# Patient Record
Sex: Female | Born: 1935 | Race: White | Hispanic: No | Marital: Married | Smoking: Never smoker
Health system: Southern US, Community
[De-identification: ages and names within clinical notes are randomized; demographics above are authoritative.]

## PROBLEM LIST (undated history)

## (undated) DIAGNOSIS — J45909 Unspecified asthma, uncomplicated: Secondary | ICD-10-CM

## (undated) DIAGNOSIS — K219 Gastro-esophageal reflux disease without esophagitis: Secondary | ICD-10-CM

## (undated) DIAGNOSIS — R51 Headache: Secondary | ICD-10-CM

## (undated) DIAGNOSIS — I219 Acute myocardial infarction, unspecified: Secondary | ICD-10-CM

## (undated) DIAGNOSIS — I420 Dilated cardiomyopathy: Secondary | ICD-10-CM

## (undated) DIAGNOSIS — D649 Anemia, unspecified: Secondary | ICD-10-CM

## (undated) DIAGNOSIS — M1990 Unspecified osteoarthritis, unspecified site: Secondary | ICD-10-CM

## (undated) DIAGNOSIS — T827XXA Infection and inflammatory reaction due to other cardiac and vascular devices, implants and grafts, initial encounter: Secondary | ICD-10-CM

## (undated) DIAGNOSIS — R7881 Bacteremia: Secondary | ICD-10-CM

## (undated) DIAGNOSIS — L402 Acrodermatitis continua: Secondary | ICD-10-CM

## (undated) DIAGNOSIS — K922 Gastrointestinal hemorrhage, unspecified: Secondary | ICD-10-CM

## (undated) DIAGNOSIS — R519 Headache, unspecified: Secondary | ICD-10-CM

## (undated) DIAGNOSIS — I428 Other cardiomyopathies: Secondary | ICD-10-CM

## (undated) DIAGNOSIS — I447 Left bundle-branch block, unspecified: Secondary | ICD-10-CM

## (undated) DIAGNOSIS — I509 Heart failure, unspecified: Secondary | ICD-10-CM

## (undated) DIAGNOSIS — K859 Acute pancreatitis without necrosis or infection, unspecified: Secondary | ICD-10-CM

## (undated) DIAGNOSIS — Z9289 Personal history of other medical treatment: Secondary | ICD-10-CM

## (undated) DIAGNOSIS — I5042 Chronic combined systolic (congestive) and diastolic (congestive) heart failure: Secondary | ICD-10-CM

## (undated) DIAGNOSIS — E039 Hypothyroidism, unspecified: Secondary | ICD-10-CM

## (undated) DIAGNOSIS — I4891 Unspecified atrial fibrillation: Secondary | ICD-10-CM

## (undated) DIAGNOSIS — A419 Sepsis, unspecified organism: Secondary | ICD-10-CM

## (undated) DIAGNOSIS — F09 Unspecified mental disorder due to known physiological condition: Secondary | ICD-10-CM

## (undated) DIAGNOSIS — F32A Depression, unspecified: Secondary | ICD-10-CM

## (undated) DIAGNOSIS — B9561 Methicillin susceptible Staphylococcus aureus infection as the cause of diseases classified elsewhere: Secondary | ICD-10-CM

## (undated) DIAGNOSIS — M3500 Sicca syndrome, unspecified: Secondary | ICD-10-CM

## (undated) DIAGNOSIS — K635 Polyp of colon: Secondary | ICD-10-CM

## (undated) DIAGNOSIS — L4050 Arthropathic psoriasis, unspecified: Secondary | ICD-10-CM

## (undated) DIAGNOSIS — G47419 Narcolepsy without cataplexy: Secondary | ICD-10-CM

## (undated) DIAGNOSIS — Z8619 Personal history of other infectious and parasitic diseases: Secondary | ICD-10-CM

## (undated) HISTORY — PX: HX LUMBAR FUSION: SHX111

## (undated) HISTORY — DX: Sjogren syndrome, unspecified: M35.00

## (undated) HISTORY — DX: Unspecified asthma, uncomplicated: J45.909

## (undated) HISTORY — DX: Methicillin susceptible Staphylococcus aureus infection as the cause of diseases classified elsewhere: B95.61

## (undated) HISTORY — DX: Polyp of colon: K63.5

## (undated) HISTORY — DX: Unspecified atrial fibrillation: I48.91

## (undated) HISTORY — PX: HX CATARACT REMOVAL: SHX102

## (undated) HISTORY — PX: HX HYSTERECTOMY: SHX81

## (undated) HISTORY — DX: Acute myocardial infarction, unspecified: I21.9

## (undated) HISTORY — DX: Sepsis, unspecified organism: A41.9

## (undated) HISTORY — DX: Heart failure, unspecified: I50.9

## (undated) HISTORY — DX: Acrodermatitis continua: L40.2

## (undated) HISTORY — DX: Arthropathic psoriasis, unspecified: L40.50

## (undated) HISTORY — DX: Narcolepsy without cataplexy: G47.419

## (undated) HISTORY — DX: Unspecified osteoarthritis, unspecified site: M19.90

## (undated) HISTORY — PX: HX KNEE SURGERY: 2100001320

## (undated) HISTORY — DX: Gastro-esophageal reflux disease without esophagitis: K21.9

## (undated) HISTORY — PX: HX HERNIA REPAIR: SHX51

## (undated) HISTORY — DX: Dilated cardiomyopathy: I42.0

## (undated) HISTORY — DX: Bacteremia: R78.81

## (undated) HISTORY — PX: HX GALL BLADDER SURGERY/CHOLE: SHX55

## (undated) HISTORY — PX: ESOPHAGOGASTRODUODENOSCOPY: SHX1529

## (undated) HISTORY — PX: REPLACEMENT TOTAL KNEE: SUR1224

## (undated) HISTORY — DX: Personal history of other infectious and parasitic diseases: Z86.19

## (undated) HISTORY — DX: Left bundle-branch block, unspecified: I44.7

## (undated) HISTORY — PX: HX APPENDECTOMY: SHX54

## (undated) HISTORY — DX: Depression, unspecified: F32.A

## (undated) HISTORY — DX: Hypothyroidism, unspecified: E03.9

## (undated) HISTORY — PX: COLONOSCOPY: SHX174

## (undated) HISTORY — PX: HX TONSILLECTOMY: SHX27

## (undated) HISTORY — PX: HIP ARTHROPLASTY: SHX981

## (undated) HISTORY — PX: HX PACEMAKER INSERTION: 2100001161

## (undated) HISTORY — DX: Unspecified mental disorder due to known physiological condition: F09

## (undated) HISTORY — PX: ABDOMINAL HYSTERECTOMY: SHX81

## (undated) HISTORY — PX: CARDIAC CATHETERIZATION: SHX172

## (undated) HISTORY — PX: CHOLECYSTECTOMY OPEN: SUR202

## (undated) HISTORY — PX: APPENDECTOMY: SHX54

## (undated) HISTORY — PX: JOINT REPLACEMENT: SHX530

## (undated) HISTORY — DX: Other cardiomyopathies: I42.8

## (undated) HISTORY — DX: Infection and inflammatory reaction due to other cardiac and vascular devices, implants and grafts, initial encounter: T82.7XXA

## (undated) HISTORY — PX: DILATION AND CURETTAGE OF UTERUS: SHX78

## (undated) HISTORY — PX: TONSILLECTOMY: SUR1361

## (undated) HISTORY — DX: Chronic combined systolic (congestive) and diastolic (congestive) heart failure: I50.42

## (undated) HISTORY — PX: CATARACT EXTRACTION W/ INTRAOCULAR LENS  IMPLANT, BILATERAL: SHX1307

## (undated) HISTORY — PX: LOOP RECORDER REMOVAL: EP1215

## (undated) HISTORY — PX: TOTAL HIP ARTHROPLASTY: SHX124

## (undated) HISTORY — PX: UMBILICAL HERNIA REPAIR: SHX196

## (undated) HISTORY — PX: HERNIA REPAIR: SHX51

## (undated) HISTORY — PX: INSERT / REPLACE / REMOVE PACEMAKER: SUR710

## (undated) HISTORY — PX: CARDIAC DEFIBRILLATOR PLACEMENT: SHX171

---

## 1968-01-08 HISTORY — PX: AORTA SURGERY: SHX548

## 1978-01-07 HISTORY — PX: BLADDER SURGERY: SHX569

## 1978-01-07 HISTORY — PX: HX HYSTERECTOMY: SHX81

## 1997-04-27 ENCOUNTER — Ambulatory Visit (HOSPITAL_BASED_OUTPATIENT_CLINIC_OR_DEPARTMENT_OTHER): Admission: RE | Admit: 1997-04-27 | Discharge: 1997-04-27 | Payer: Self-pay | Admitting: *Deleted

## 1997-05-10 ENCOUNTER — Ambulatory Visit (HOSPITAL_COMMUNITY): Admission: RE | Admit: 1997-05-10 | Discharge: 1997-05-10 | Payer: Self-pay | Admitting: *Deleted

## 1998-02-23 ENCOUNTER — Other Ambulatory Visit: Admission: RE | Admit: 1998-02-23 | Discharge: 1998-02-23 | Payer: Self-pay

## 1998-05-15 ENCOUNTER — Ambulatory Visit (HOSPITAL_COMMUNITY): Admission: RE | Admit: 1998-05-15 | Discharge: 1998-05-15 | Payer: Self-pay | Admitting: Gastroenterology

## 1998-10-24 ENCOUNTER — Encounter: Admission: RE | Admit: 1998-10-24 | Discharge: 1998-10-24 | Payer: Self-pay | Admitting: Orthopedic Surgery

## 1998-10-24 ENCOUNTER — Encounter: Payer: Self-pay | Admitting: Orthopedic Surgery

## 1998-10-25 ENCOUNTER — Ambulatory Visit (HOSPITAL_BASED_OUTPATIENT_CLINIC_OR_DEPARTMENT_OTHER): Admission: RE | Admit: 1998-10-25 | Discharge: 1998-10-26 | Payer: Self-pay | Admitting: Orthopedic Surgery

## 1998-10-25 ENCOUNTER — Ambulatory Visit (HOSPITAL_BASED_OUTPATIENT_CLINIC_OR_DEPARTMENT_OTHER): Admission: RE | Admit: 1998-10-25 | Discharge: 1998-10-25 | Payer: Self-pay | Admitting: Orthopedic Surgery

## 2001-07-06 ENCOUNTER — Ambulatory Visit (HOSPITAL_COMMUNITY): Admission: RE | Admit: 2001-07-06 | Discharge: 2001-07-06 | Payer: Self-pay | Admitting: Gastroenterology

## 2002-04-08 ENCOUNTER — Encounter: Payer: Self-pay | Admitting: Orthopedic Surgery

## 2002-04-12 ENCOUNTER — Inpatient Hospital Stay (HOSPITAL_COMMUNITY): Admission: RE | Admit: 2002-04-12 | Discharge: 2002-04-16 | Payer: Self-pay | Admitting: Orthopedic Surgery

## 2004-08-16 ENCOUNTER — Ambulatory Visit (HOSPITAL_COMMUNITY): Admission: RE | Admit: 2004-08-16 | Discharge: 2004-08-16 | Payer: Self-pay | Admitting: Gastroenterology

## 2004-09-12 ENCOUNTER — Inpatient Hospital Stay (HOSPITAL_COMMUNITY): Admission: RE | Admit: 2004-09-12 | Discharge: 2004-09-17 | Payer: Self-pay | Admitting: Orthopedic Surgery

## 2004-10-15 ENCOUNTER — Inpatient Hospital Stay (HOSPITAL_COMMUNITY): Admission: EM | Admit: 2004-10-15 | Discharge: 2004-10-22 | Payer: Self-pay | Admitting: Emergency Medicine

## 2008-01-08 HISTORY — PX: HX PACEMAKER INSERTION: 2100001161

## 2013-04-26 DIAGNOSIS — L402 Acrodermatitis continua: Secondary | ICD-10-CM | POA: Insufficient documentation

## 2013-10-11 ENCOUNTER — Emergency Department (HOSPITAL_COMMUNITY): Payer: Medicare Other

## 2013-10-11 ENCOUNTER — Encounter (HOSPITAL_COMMUNITY): Payer: Self-pay | Admitting: Emergency Medicine

## 2013-10-11 ENCOUNTER — Emergency Department (HOSPITAL_COMMUNITY)
Admission: EM | Admit: 2013-10-11 | Discharge: 2013-10-11 | Disposition: A | Discharge status: Home / Self Care | Payer: Medicare Other | Attending: Emergency Medicine | Admitting: Emergency Medicine

## 2013-10-11 DIAGNOSIS — Z8719 Personal history of other diseases of the digestive system: Secondary | ICD-10-CM | POA: Insufficient documentation

## 2013-10-11 DIAGNOSIS — Z8679 Personal history of other diseases of the circulatory system: Secondary | ICD-10-CM | POA: Diagnosis not present

## 2013-10-11 DIAGNOSIS — Z8739 Personal history of other diseases of the musculoskeletal system and connective tissue: Secondary | ICD-10-CM | POA: Insufficient documentation

## 2013-10-11 DIAGNOSIS — J45909 Unspecified asthma, uncomplicated: Secondary | ICD-10-CM | POA: Insufficient documentation

## 2013-10-11 DIAGNOSIS — R079 Chest pain, unspecified: Secondary | ICD-10-CM | POA: Diagnosis present

## 2013-10-11 DIAGNOSIS — Z8639 Personal history of other endocrine, nutritional and metabolic disease: Secondary | ICD-10-CM | POA: Insufficient documentation

## 2013-10-11 HISTORY — DX: Unspecified asthma, uncomplicated: J45.909

## 2013-10-11 HISTORY — DX: Acute pancreatitis without necrosis or infection, unspecified: K85.90

## 2013-10-11 HISTORY — DX: Left bundle-branch block, unspecified: I44.7

## 2013-10-11 HISTORY — DX: Unspecified osteoarthritis, unspecified site: M19.90

## 2013-10-11 LAB — CBC
HCT: 40.1 % (ref 36.0–46.0)
Hemoglobin: 14.1 g/dL (ref 12.0–15.0)
MCH: 31.9 pg (ref 26.0–34.0)
MCHC: 35.2 g/dL (ref 30.0–36.0)
MCV: 90.7 fL (ref 78.0–100.0)
Platelets: 189 10*3/uL (ref 150–400)
RBC: 4.42 MIL/uL (ref 3.87–5.11)
RDW: 13.4 % (ref 11.5–15.5)
WBC: 6.8 10*3/uL (ref 4.0–10.5)

## 2013-10-11 LAB — BASIC METABOLIC PANEL
Anion gap: 14 (ref 5–15)
BUN: 15 mg/dL (ref 6–23)
CO2: 25 mEq/L (ref 19–32)
Calcium: 9.8 mg/dL (ref 8.4–10.5)
Chloride: 100 mEq/L (ref 96–112)
Creatinine, Ser: 1.14 mg/dL — ABNORMAL HIGH (ref 0.50–1.10)
GFR calc Af Amer: 53 mL/min — ABNORMAL LOW (ref >90)
GFR calc non Af Amer: 46 mL/min — ABNORMAL LOW (ref >90)
Glucose, Bld: 107 mg/dL — ABNORMAL HIGH (ref 70–99)
Potassium: 3.8 mEq/L (ref 3.7–5.3)
Sodium: 139 mEq/L (ref 137–147)

## 2013-10-11 LAB — I-STAT TROPONIN, ED: Troponin i, poc: 0 ng/mL (ref 0.00–0.08)

## 2013-10-11 MED ORDER — ONDANSETRON HCL 4 MG/2ML IJ SOLN
4.0000 mg | Freq: Once | INTRAMUSCULAR | Status: AC
  Administered 2013-10-11: 4 mg via INTRAVENOUS
  Filled 2013-10-11: qty 2

## 2013-10-11 MED ORDER — GI COCKTAIL ~~LOC~~
30.0000 mL | Freq: Once | ORAL | Status: AC
  Administered 2013-10-11: 30 mL via ORAL
  Filled 2013-10-11: qty 30

## 2013-10-11 NOTE — ED Notes (Signed)
Pt reports for the past 2 days she was having abd pain and made appointment with physician, then this morning was awaken by the central sub-sternal chest pressure that radiated into jaw and caused her to feel sob and nauseous. Denies vomiting. Nad, skin warm and dry, resp e/u.

## 2013-10-11 NOTE — ED Notes (Signed)
Per EMS - pt coming from doctors office. Pt woke up around 430 am this morning with substernal cp with sob and diaphoresis. Hx of left BBB, 12 lead showing the same. Pt c/o nausea. Pt given 324 mg of ASA, 4 mg Zofran, 2 Nitro, which decreased pain level to 1/10. HR 86, BP 131/59, 22 G in left AC.

## 2013-10-11 NOTE — Discharge Instructions (Signed)
Prilosec 20 mg once daily.  Follow up with your primary Dr. if not improving in the next several days, and return to the ER if your symptoms substantially worsen or change.   Chest Pain (Nonspecific) It is often hard to give a specific diagnosis for the cause of chest pain. There is always a chance that your pain could be related to something serious, such as a heart attack or a blood clot in the lungs. You need to follow up with your health care provider for further evaluation. CAUSES   Heartburn.  Pneumonia or bronchitis.  Anxiety or stress.  Inflammation around your heart (pericarditis) or lung (pleuritis or pleurisy).  A blood clot in the lung.  A collapsed lung (pneumothorax). It can develop suddenly on its own (spontaneous pneumothorax) or from trauma to the chest.  Shingles infection (herpes zoster virus). The chest wall is composed of bones, muscles, and cartilage. Any of these can be the source of the pain.  The bones can be bruised by injury.  The muscles or cartilage can be strained by coughing or overwork.  The cartilage can be affected by inflammation and become sore (costochondritis). DIAGNOSIS  Lab tests or other studies may be needed to find the cause of your pain. Your health care provider may have you take a test called an ambulatory electrocardiogram (ECG). An ECG records your heartbeat patterns over a 24-hour period. You may also have other tests, such as:  Transthoracic echocardiogram (TTE). During echocardiography, sound waves are used to evaluate how blood flows through your heart.  Transesophageal echocardiogram (TEE).  Cardiac monitoring. This allows your health care provider to monitor your heart rate and rhythm in real time.  Holter monitor. This is a portable device that records your heartbeat and can help diagnose heart arrhythmias. It allows your health care provider to track your heart activity for several days, if needed.  Stress tests by  exercise or by giving medicine that makes the heart beat faster. TREATMENT   Treatment depends on what may be causing your chest pain. Treatment may include:  Acid blockers for heartburn.  Anti-inflammatory medicine.  Pain medicine for inflammatory conditions.  Antibiotics if an infection is present.  You may be advised to change lifestyle habits. This includes stopping smoking and avoiding alcohol, caffeine, and chocolate.  You may be advised to keep your head raised (elevated) when sleeping. This reduces the chance of acid going backward from your stomach into your esophagus. Most of the time, nonspecific chest pain will improve within 2-3 days with rest and mild pain medicine.  HOME CARE INSTRUCTIONS   If antibiotics were prescribed, take them as directed. Finish them even if you start to feel better.  For the next few days, avoid physical activities that bring on chest pain. Continue physical activities as directed.  Do not use any tobacco products, including cigarettes, chewing tobacco, or electronic cigarettes.  Avoid drinking alcohol.  Only take medicine as directed by your health care provider.  Follow your health care provider's suggestions for further testing if your chest pain does not go away.  Keep any follow-up appointments you made. If you do not go to an appointment, you could develop lasting (chronic) problems with pain. If there is any problem keeping an appointment, call to reschedule. SEEK MEDICAL CARE IF:   Your chest pain does not go away, even after treatment.  You have a rash with blisters on your chest.  You have a fever. SEEK IMMEDIATE MEDICAL CARE IF:  You have increased chest pain or pain that spreads to your arm, neck, jaw, back, or abdomen.  You have shortness of breath.  You have an increasing cough, or you cough up blood.  You have severe back or abdominal pain.  You feel nauseous or vomit.  You have severe weakness.  You  faint.  You have chills. This is an emergency. Do not wait to see if the pain will go away. Get medical help at once. Call your local emergency services (911 in [place] Do not drive yourself to the hospital. MAKE SURE YOU:   Understand these instructions.  Will watch your condition.  Will get help right away if you are not doing well or get worse. Document Released: 10/03/2004 Document Revised: 12/29/2012 Document Reviewed: 07/30/2007 [facility] Patient Information 2015 [place], [place]. This information is not intended to replace advice given to you by your health care provider. Make sure you discuss any questions you have with your health care provider.

## 2013-10-11 NOTE — ED Provider Notes (Signed)
CSN: [id number]     Arrival [id number] & time 10/11/13  1337 History   First MD Initiated Contact with Patient 10/11/13 1339     Chief Complaint  Patient presents with  . Chest Pain     (Consider location/radiation/quality/duration/timing/severity/associated sxs/prior Treatment) HPI Comments: Patient is a 78 year old female with history of arthritis and asthma. She presents today for evaluation of chest pain. She states this started at around 4:30 AM and woke her from sleep. She describes her discomfort as a "pressure" in the front of her chest that radiates into her neck. She feels nauseated but denies to me she is having any shortness of breath or diaphoresis. She came by EMS who gave her nitroglycerin and aspirin, and appears to be feeling better.  She had hernia repair performed in may. Prior to this surgery, she was quite undergo a stress test which was [place] by [place]. She was told that this test was unremarkable. She has no prior cardiac history and has no cardiac risk factors.  Patient is a 78 y.o. female presenting with chest pain. The history is provided by the patient.  Chest Pain Pain location:  Substernal area Pain quality: pressure   Pain radiates to:  Does not radiate Pain radiates to the back: no   Pain severity:  Moderate Onset quality:  Sudden Timing:  Constant Progression:  Partially resolved Chronicity:  New Relieved by:  Nothing Worsened by:  Nothing tried Ineffective treatments:  None tried   Past Medical History  Diagnosis Date  . Left bundle branch block   . Asthma   . Thyroid disease   . Arthritis   . Pancreatitis, acute    Past Surgical History  Procedure Laterality Date  . Hernia repair    . Abdominal hysterectomy    . Cholecystectomy     No family history on file. History  Substance Use Topics  . Smoking status: Never Smoker   . Smokeless tobacco: Not on file  . Alcohol Use: Yes     Comment: occassionally    OB History   Grav Para Term Preterm Abortions TAB SAB Ect Mult Living                 Review of Systems  Cardiovascular: Positive for chest pain.  All other systems reviewed and are negative.     Allergies  Sulfa antibiotics  Home Medications   Prior to Admission medications   Not on File   BP 130/67  Temp(Src) 98.2 F (36.8 C) (Oral)  Resp 16  Ht 5' (1.524 m)  Wt 165 lb (74.844 kg)  BMI 32.22 kg/m2  SpO2 98% Physical Exam  Nursing note and vitals reviewed. Constitutional: She is oriented to person, place, and time. She appears well-developed and well-nourished. No distress.  HENT:  Head: Normocephalic and atraumatic.  Neck: Normal range of motion. Neck supple.  Cardiovascular: Normal rate and regular rhythm.  Exam reveals no gallop and no friction rub.   No murmur heard. Pulmonary/Chest: Effort normal and breath sounds normal. No respiratory distress. She has no wheezes.  Abdominal: Soft. Bowel sounds are normal. She exhibits no distension. There is no tenderness.  Musculoskeletal: Normal range of motion. She exhibits no edema.  Neurological: She is alert and oriented to person, place, and time.  Skin: Skin is warm and dry. She is not diaphoretic.    ED Course  Procedures (including critical care time) Labs Review Labs Reviewed  BASIC METABOLIC PANEL  CBC  Rosezena Sensor, ED    Imaging Review No results found.   Date: 10/11/2013  Rate: 86  Rhythm: normal sinus rhythm  QRS Axis: left  Intervals: normal  ST/T Wave abnormalities: nonspecific T wave changes  Conduction Disutrbances:left bundle branch block  Narrative Interpretation:   Old EKG Reviewed: none available    MDM   Final diagnoses:  None    Patient presents with complaints of chest discomfort that has been ongoing since approximately 4:30 this morning. Her EKG reveals a left bundle branch block which according to her chart is old. Her troponin is negative and remainder the workup  is unremarkable. She has had significant relief from a GI cocktail and I suspect her symptoms are GI in nature. I will advise Prilosec and follow up with her primary Dr. She has had a nuclear stress test in the past year which was normal.    Geoffery Lyons, MD 10/11/13 [phone]

## 2013-10-11 NOTE — ED Notes (Signed)
Patient to xray at this time

## 2013-10-11 NOTE — ED Notes (Signed)
Received report from [place], RN at this time.

## 2013-10-29 ENCOUNTER — Encounter: Payer: Self-pay | Admitting: *Deleted

## 2013-10-29 ENCOUNTER — Ambulatory Visit (INDEPENDENT_AMBULATORY_CARE_PROVIDER_SITE_OTHER): Payer: Medicare Other | Admitting: Cardiology

## 2013-10-29 ENCOUNTER — Encounter: Payer: Self-pay | Admitting: Cardiology

## 2013-10-29 ENCOUNTER — Encounter (HOSPITAL_COMMUNITY): Payer: Self-pay | Admitting: Pharmacy Technician

## 2013-10-29 VITALS — BP 128/78 | HR 67 | Ht 60.0 in | Wt 163.0 lb

## 2013-10-29 DIAGNOSIS — I447 Left bundle-branch block, unspecified: Secondary | ICD-10-CM

## 2013-10-29 DIAGNOSIS — Z01812 Encounter for preprocedural laboratory examination: Secondary | ICD-10-CM

## 2013-10-29 DIAGNOSIS — I209 Angina pectoris, unspecified: Secondary | ICD-10-CM

## 2013-10-29 DIAGNOSIS — R072 Precordial pain: Secondary | ICD-10-CM

## 2013-10-29 DIAGNOSIS — Z8249 Family history of ischemic heart disease and other diseases of the circulatory system: Secondary | ICD-10-CM

## 2013-10-29 DIAGNOSIS — I208 Other forms of angina pectoris: Secondary | ICD-10-CM

## 2013-10-29 DIAGNOSIS — R079 Chest pain, unspecified: Secondary | ICD-10-CM

## 2013-10-29 LAB — CBC WITH DIFFERENTIAL/PLATELET
Basophils Absolute: 0 10*3/uL (ref 0.0–0.1)
Basophils Relative: 0.6 % (ref 0.0–3.0)
Eosinophils Absolute: 0.4 10*3/uL (ref 0.0–0.7)
Eosinophils Relative: 6.3 % — ABNORMAL HIGH (ref 0.0–5.0)
HCT: 39.1 % (ref 36.0–46.0)
Hemoglobin: 13.3 g/dL (ref 12.0–15.0)
Lymphocytes Relative: 17.3 % (ref 12.0–46.0)
Lymphs Abs: 1.2 10*3/uL (ref 0.7–4.0)
MCHC: 33.9 g/dL (ref 30.0–36.0)
MCV: 91.3 fl (ref 78.0–100.0)
Monocytes Absolute: 0.4 10*3/uL (ref 0.1–1.0)
Monocytes Relative: 6.2 % (ref 3.0–12.0)
Neutro Abs: 4.8 10*3/uL (ref 1.4–7.7)
Neutrophils Relative %: 69.6 % (ref 43.0–77.0)
Platelets: 207 10*3/uL (ref 150.0–400.0)
RBC: 4.28 Mil/uL (ref 3.87–5.11)
RDW: 14 % (ref 11.5–15.5)
WBC: 6.9 10*3/uL (ref 4.0–10.5)

## 2013-10-29 LAB — COMPREHENSIVE METABOLIC PANEL
ALT: 12 U/L (ref 0–35)
AST: 23 U/L (ref 0–37)
Albumin: 3.7 g/dL (ref 3.5–5.2)
Alkaline Phosphatase: 63 U/L (ref 39–117)
BUN: 20 mg/dL (ref 6–23)
CO2: 26 mEq/L (ref 19–32)
Calcium: 10 mg/dL (ref 8.4–10.5)
Chloride: 106 mEq/L (ref 96–112)
Creatinine, Ser: 0.9 mg/dL (ref 0.4–1.2)
GFR: 62.32 mL/min (ref >60.00)
Glucose, Bld: 94 mg/dL (ref 70–99)
Potassium: 3.7 mEq/L (ref 3.5–5.1)
Sodium: 140 mEq/L (ref 135–145)
Total Bilirubin: 0.7 mg/dL (ref 0.2–1.2)
Total Protein: 7.4 g/dL (ref 6.0–8.3)

## 2013-10-29 LAB — PROTIME-INR
INR: 1 ratio (ref 0.8–1.0)
Prothrombin Time: 10.9 s (ref 9.6–13.1)

## 2013-10-29 MED ORDER — ASPIRIN EC 81 MG PO TBEC: 81.0000 mg | DELAYED_RELEASE_TABLET | Freq: Every day | ORAL | Status: DC

## 2013-10-29 MED ORDER — NITROGLYCERIN 0.4 MG SL SUBL
0.4000 mg | SUBLINGUAL_TABLET | SUBLINGUAL | Status: DC | PRN
Start: 2013-10-29 — End: 2014-07-18

## 2013-10-29 NOTE — Patient Instructions (Signed)
Your physician has recommended you make the following change in your medication:   DR NELSON HAS PRESCRIBED FOR YOU TO TAKE NITROGLYCERIN AS NEEDED FOR CHEST PAIN- PLEASE FOLLOW THE INSTRUCTIONS CAREFULLY  PLEASE START TAKING ASPIRIN 81 MG DAILY   Your physician has requested that you have a cardiac catheterization. Cardiac catheterization is used to diagnose and/or treat various heart conditions. Doctors may recommend this procedure for a number of different reasons. The most common reason is to evaluate chest pain. Chest pain can be a symptom of coronary artery disease (CAD), and cardiac catheterization can show whether plaque is narrowing or blocking your heart's arteries. This procedure is also used to evaluate the valves, as well as measure the blood flow and oxygen levels in different parts of your heart. For further information please visit [email or web address] Please follow instruction sheet, as given.  YOUR CATH IS SCHEDULED FOR NEXT Tuesday 11/02/13 AT 7:30 AM WITH DR Clifton James- YOU MUST BE THERE BY 5:30 AM AT [facility]  Your physician recommends that you return for lab work in: TODAY (CMET, PT/INR, CBC W DIFF)  Your physician recommends that you schedule a follow-up appointment in: 6 WEEKS WITH DR Delton See

## 2013-10-29 NOTE — Progress Notes (Signed)
Patient ID: [name] [name], female   DOB: 1935-07-25, 78 y.o.   MRN: [id number]     [name] Date of Encounter: 10/29/2013  Primary Care Provider:  No PCP Per Patient Primary Cardiologist:  Lars Masson  Problem List   Past Medical History  Diagnosis Date  . Left bundle branch block   . Asthma   . Thyroid disease   . Arthritis   . Pancreatitis, acute    Past Surgical History  Procedure Laterality Date  . Hernia repair    . Abdominal hysterectomy    . Cholecystectomy      Allergies  Allergies  Allergen Reactions  . Sulfa Antibiotics Hives    HPI  Patient is a 78 year old female with history of arthritis and asthma. She presents today for evaluation of chest pain.  She was found to have LBBB a year ago when she was undergoing preop evaluation for hernia surgery. She had a Lexiscan nuclear stress in [place] test that was negative. She has developed progressively worsening chest pain in the last two months. The pain is exertional and resolves at rest. Light physical activity can bring on the pain. There is radiation to the left arm.  On 10/11/2013 she went to the ER for chest pain that woke her from sleep. She described her discomfort as a "pressure" in the front of her chest that radiates into her neck. She felt nauseated but denied to me she is having any shortness of breath or diaphoresis. She came by EMS who gave her nitroglycerin and aspirin that helped her pain. ACS was ruled out and she was discharged home.   Her sister had myocardial infarction at age 34, father had CABG at 64, her daughter had myocardial infarction at age 45, per patient secondary to Prinzmetal angina.    Home Medications  Prior to Admission medications   Medication Sig Start Date End Date Taking? Authorizing Provider  amoxicillin (AMOXIL) 500 MG capsule Take 2,000 mg by mouth once. Before dental appointments 09/29/13  Yes Historical Provider, MD    aspirin-acetaminophen-caffeine (EXCEDRIN MIGRAINE) [phone] MG per tablet Take 1 tablet by mouth every 6 (six) hours as needed for headache or migraine.   Yes Historical Provider, MD  beta carotene w/minerals (OCUVITE) tablet Take 1 tablet by mouth daily.   Yes Historical Provider, MD  calcium carbonate (TUMS - DOSED IN MG ELEMENTAL CALCIUM) 500 MG chewable tablet Chew 1-4 tablets by mouth 3 (three) times daily as needed for indigestion or heartburn.   Yes Historical Provider, MD  Cholecalciferol (VITAMIN D PO) Take 1 tablet by mouth daily.   Yes Historical Provider, MD  naproxen (NAPROSYN) 500 MG tablet Take 500 mg by mouth 2 (two) times daily. 09/08/13  Yes Historical Provider, MD  ondansetron (ZOFRAN) 4 MG tablet Take 4 mg by mouth every 8 (eight) hours as needed for nausea or vomiting.   Yes Historical Provider, MD  PROAIR HFA 108 (90 BASE) MCG/ACT inhaler Inhale 1 puff into the lungs every 4 (four) hours as needed for wheezing or shortness of breath.  09/08/13  Yes Historical Provider, MD  SYNTHROID 100 MCG tablet Take 100 mcg by mouth daily. 08/27/13  Yes Historical Provider, MD    Family History  No family history on file.  Social History  History   Social History  . Marital Status: Married    Spouse Name: N/A    Number of Children: N/A  . Years of Education: N/A   Occupational  History  . Not on file.   Social History Main Topics  . Smoking status: Never Smoker   . Smokeless tobacco: Not on file  . Alcohol Use: Yes     Comment: occassionally  . Drug Use: No  . Sexual Activity: Not on file   Other Topics Concern  . Not on file   Social History Narrative  . No narrative on file     Review of Systems, as per HPI, otherwise negative General:  No chills, fever, night sweats or weight changes.  Cardiovascular:  No chest pain, dyspnea on exertion, edema, orthopnea, palpitations, paroxysmal nocturnal dyspnea. Dermatological: No rash, lesions/masses Respiratory: No cough,  dyspnea Urologic: No hematuria, dysuria Abdominal:   No nausea, vomiting, diarrhea, bright red blood per rectum, melena, or hematemesis Neurologic:  No visual changes, wkns, changes in mental status. All other systems reviewed and are otherwise negative except as noted above.  Physical Exam  Blood pressure 128/78, pulse 67, height 5' (1.524 m), weight 163 lb (73.936 kg).  General: Pleasant, NAD Psych: Normal affect. Neuro: Alert and oriented X 3. Moves all extremities spontaneously. HEENT: Normal  Neck: Supple without bruits or JVD. Lungs:  Resp regular and unlabored, CTA. Heart: RRR no s3, s4, or murmurs. Abdomen: Soft, non-tender, non-distended, BS + x 4.  Extremities: No clubbing, cyanosis or edema. DP/PT/Radials 2+ and equal bilaterally.  Labs:  No results found for this basename: CKTOTAL, CKMB, TROPONINI,  in the last 72 hours Lab Results  Component Value Date   WBC 6.8 10/11/2013   HGB 14.1 10/11/2013   HCT 40.1 10/11/2013   MCV 90.7 10/11/2013   PLT 189 10/11/2013    No results found for this basename: DDIMER   No components found with this basename: POCBNP,     Component Value Date/Time   NA 139 10/11/2013 1358   K 3.8 10/11/2013 1358   CL 100 10/11/2013 1358   CO2 25 10/11/2013 1358   GLUCOSE 107* 10/11/2013 1358   BUN 15 10/11/2013 1358   CREATININE 1.14* 10/11/2013 1358   CALCIUM 9.8 10/11/2013 1358   GFRNONAA 46* 10/11/2013 1358   GFRAA 53* 10/11/2013 1358   No results found for this basename: CHOL, HDL, LDLCALC, TRIG    Accessory Clinical Findings  Echocardiogram - none  ECG - SR, LBBB    Assessment & Plan  A very pleasant 78 year old female  1. Typical chest pain - risk factors include significant FH of premature CAD. We will schedule a cardiac cath for the next Tuesday 11/02/2013. We will start aspirin 81 mg po daily and sl NTG PRN.  2. BP - controlled  3. Lipids - unknown - we will check  Check pre-cath labs today including CBC, CMP, INR.   Follow  up after the cath.  Lars MassonNELSON, Merrin Mcvicker H, MD, [facility] 10/29/2013, 9:08 AM

## 2013-10-30 DIAGNOSIS — Z8249 Family history of ischemic heart disease and other diseases of the circulatory system: Secondary | ICD-10-CM | POA: Insufficient documentation

## 2013-10-30 DIAGNOSIS — I447 Left bundle-branch block, unspecified: Secondary | ICD-10-CM | POA: Insufficient documentation

## 2013-11-02 ENCOUNTER — Encounter (HOSPITAL_COMMUNITY): Payer: Self-pay | Admitting: Interventional Cardiology

## 2013-11-02 ENCOUNTER — Ambulatory Visit (HOSPITAL_COMMUNITY)
Admission: RE | Admit: 2013-11-02 | Discharge: 2013-11-02 | Disposition: A | Discharge status: Home / Self Care | Payer: Medicare Other | Source: Ambulatory Visit | Attending: Cardiovascular Disease | Admitting: Cardiovascular Disease

## 2013-11-02 ENCOUNTER — Encounter (HOSPITAL_COMMUNITY)
Admission: RE | Disposition: A | Discharge status: Home / Self Care | Payer: Self-pay | Source: Ambulatory Visit | Attending: Cardiovascular Disease

## 2013-11-02 DIAGNOSIS — R079 Chest pain, unspecified: Secondary | ICD-10-CM | POA: Diagnosis not present

## 2013-11-02 DIAGNOSIS — I447 Left bundle-branch block, unspecified: Secondary | ICD-10-CM | POA: Diagnosis not present

## 2013-11-02 DIAGNOSIS — I209 Angina pectoris, unspecified: Secondary | ICD-10-CM | POA: Diagnosis present

## 2013-11-02 DIAGNOSIS — I771 Stricture of artery: Secondary | ICD-10-CM | POA: Insufficient documentation

## 2013-11-02 DIAGNOSIS — Z79899 Other long term (current) drug therapy: Secondary | ICD-10-CM | POA: Diagnosis not present

## 2013-11-02 DIAGNOSIS — I208 Other forms of angina pectoris: Secondary | ICD-10-CM

## 2013-11-02 DIAGNOSIS — Z792 Long term (current) use of antibiotics: Secondary | ICD-10-CM | POA: Insufficient documentation

## 2013-11-02 DIAGNOSIS — Z8249 Family history of ischemic heart disease and other diseases of the circulatory system: Secondary | ICD-10-CM | POA: Diagnosis not present

## 2013-11-02 HISTORY — PX: LEFT HEART CATHETERIZATION WITH CORONARY ANGIOGRAM: SHX5451

## 2013-11-02 SURGERY — LEFT HEART CATHETERIZATION WITH CORONARY ANGIOGRAM
Anesthesia: LOCAL

## 2013-11-02 MED ORDER — SODIUM CHLORIDE 0.9 % IV SOLN: 250.0000 mL | INTRAVENOUS | Status: DC | PRN

## 2013-11-02 MED ORDER — SODIUM CHLORIDE 0.9 % IJ SOLN: 3.0000 mL | Freq: Two times a day (BID) | INTRAMUSCULAR | Status: DC

## 2013-11-02 MED ORDER — MIDAZOLAM HCL 2 MG/2ML IJ SOLN
INTRAMUSCULAR | Status: AC
  Filled 2013-11-02: qty 2

## 2013-11-02 MED ORDER — LIDOCAINE HCL (PF) 1 % IJ SOLN
INTRAMUSCULAR | Status: AC
  Filled 2013-11-02: qty 30

## 2013-11-02 MED ORDER — HEPARIN SODIUM (PORCINE) 1000 UNIT/ML IJ SOLN
INTRAMUSCULAR | Status: AC
  Filled 2013-11-02: qty 1

## 2013-11-02 MED ORDER — HEPARIN (PORCINE) IN NACL 2-0.9 UNIT/ML-% IJ SOLN
INTRAMUSCULAR | Status: AC
  Filled 2013-11-02: qty 1500

## 2013-11-02 MED ORDER — FENTANYL CITRATE 0.05 MG/ML IJ SOLN
INTRAMUSCULAR | Status: AC
  Filled 2013-11-02: qty 2

## 2013-11-02 MED ORDER — NITROGLYCERIN 1 MG/10 ML FOR IR/CATH LAB
INTRA_ARTERIAL | Status: AC
  Filled 2013-11-02: qty 10

## 2013-11-02 MED ORDER — SODIUM CHLORIDE 0.9 % IV SOLN: 1.0000 mL/kg/h | INTRAVENOUS | Status: DC

## 2013-11-02 MED ORDER — VERAPAMIL HCL 2.5 MG/ML IV SOLN
INTRAVENOUS | Status: AC
  Filled 2013-11-02: qty 2

## 2013-11-02 MED ORDER — ASPIRIN 81 MG PO CHEW
CHEWABLE_TABLET | ORAL | Status: AC
  Filled 2013-11-02: qty 1

## 2013-11-02 MED ORDER — SODIUM CHLORIDE 0.9 % IV SOLN
INTRAVENOUS | Status: DC
  Administered 2013-11-02: 06:00:00 via INTRAVENOUS

## 2013-11-02 MED ORDER — SODIUM CHLORIDE 0.9 % IJ SOLN: 3.0000 mL | INTRAMUSCULAR | Status: DC | PRN

## 2013-11-02 MED ORDER — ASPIRIN 81 MG PO CHEW
81.0000 mg | CHEWABLE_TABLET | ORAL | Status: AC
  Administered 2013-11-02: 81 mg via ORAL

## 2013-11-02 NOTE — Interval H&P Note (Signed)
Cath Lab Visit (complete for each Cath Lab visit)  Clinical Evaluation Leading to the Procedure:   ACS: No.  Non-ACS:    Anginal Classification: CCS III  Anti-ischemic medical therapy: No Therapy  Non-Invasive Test Results: No non-invasive testing performed  Prior CABG: No previous CABG      History and Physical Interval Note:  11/02/2013 10:23 AM  [name] [name]  has presented today for surgery, with the diagnosis of left bundle branch block, angina  The various methods of treatment have been discussed with the patient and family. After consideration of risks, benefits and other options for treatment, the patient has consented to  Procedure(s): LEFT HEART CATHETERIZATION WITH CORONARY ANGIOGRAM (N/A) as a surgical intervention .  The patient's history has been reviewed, patient examined, no change in status, stable for surgery.  I have reviewed the patient's chart and labs.  Questions were answered to the patient's satisfaction.     VARANASI,JAYADEEP S.

## 2013-11-02 NOTE — Discharge Instructions (Signed)
Radial Site Care °Refer to this sheet in the next few weeks. These instructions provide you with information on caring for yourself after your procedure. Your caregiver may also give you more specific instructions. Your treatment has been planned according to current medical practices, but problems sometimes occur. Call your caregiver if you have any problems or questions after your procedure. °HOME CARE INSTRUCTIONS °· You may shower the day after the procedure. Remove the bandage (dressing) and gently wash the site with plain soap and water. Gently pat the site dry. °· Do not apply powder or lotion to the site. °· Do not submerge the affected site in water for 3 to 5 days. °· Inspect the site at least twice daily. °· Do not flex or bend the affected arm for 24 hours. °· No lifting over 5 pounds (2.3 kg) for 5 days after your procedure. °· Do not drive home if you are discharged the same day of the procedure. Have someone else drive you. °· You may drive 24 hours after the procedure unless otherwise instructed by your caregiver. °· Do not operate machinery or power tools for 24 hours. °· A responsible adult should be with you for the first 24 hours after you arrive home. °What to expect: °· Any bruising will usually fade within 1 to 2 weeks. °· Blood that collects in the tissue (hematoma) may be painful to the touch. It should usually decrease in size and tenderness within 1 to 2 weeks. °SEEK IMMEDIATE MEDICAL CARE IF: °· You have unusual pain at the radial site. °· You have redness, warmth, swelling, or pain at the radial site. °· You have drainage (other than a small amount of blood on the dressing). °· You have chills. °· You have a fever or persistent symptoms for more than 72 hours. °· You have a fever and your symptoms suddenly get worse. °· Your arm becomes pale, cool, tingly, or numb. °· You have heavy bleeding from the site. Hold pressure on the site. °Document Released: 01/26/2010 Document Revised:  03/18/2011 Document Reviewed: 01/26/2010 °Exit[employer]® Patient Information ©2015 [employer]. This information is not intended to replace advice given to you by your health care provider. Make sure you discuss any questions you have with your health care provider. ° °

## 2013-11-02 NOTE — CV Procedure (Signed)
       PROCEDURE:  Left heart catheterization with selective coronary angiography, left heart cath.  INDICATIONS:  Angina  The risks, benefits, and details of the procedure were explained to the patient.  The patient verbalized understanding and wanted to proceed.  Informed written consent was obtained.  PROCEDURE TECHNIQUE:  After Xylocaine anesthesia a 65F slender sheath was placed in the right radial artery with a single anterior needle wall stick.   IV heparin was given. Right coronary angiography was done using a Judkins R4 guide catheter.  Left coronary angiography was attempted using a Judkins L3.5 guide catheter, but this catheter would not navigate the tortuosity in the right shoulder. An EBU 3.0, 5 [place] guide catheter was used to engage the left main.  Left ventriculography was attempted using a pigtail catheter, but the pigtail catheter would not cross the tortuosity in the right shoulder. Left heart catheterization was done with a multipurpose catheter.  A TR band was used for hemostasis.   CONTRAST:  Total of 40 cc.  COMPLICATIONS:  None.    HEMODYNAMICS:  Aortic pressure was 120/55; LV pressure was 124/7; LVEDP 14.  There was no gradient between the left ventricle and aorta.    ANGIOGRAPHIC DATA:   The left main coronary artery is widely patent.  The left anterior descending artery is a large vessel which wraps around the apex. There is mild atherosclerosis in the mid vessel. There is a large diagonal vessel which is widely patent.  The left circumflex artery is a large vessel. There are 2 small marginal vessels which are patent. The third obtuse marginal is large and branches across the lateral wall. There is minimal luminal irregularity in the circumflex system.  The right coronary artery is a large, dominant vessel. There are minimal, luminal irregularities.  The posterior descending artery is medium size but patent. The posterior lateral artery is small but patent.  LEFT  VENTRICULOGRAM:  Left ventricular angiogram was not done because the pigtail catheter would not navigate the tortuosity in the right shoulder. Left heart catheterization was done with a multipurpose catheter.  LVEDP was 14 mmHg.  IMPRESSIONS:  1. Widely patent left main coronary artery. 2. Widely patent left anterior descending artery and its branches. 3. Widely patent left circumflex artery and its branches. 4. Widely patent right coronary artery. 5. Left ventricular systolic function not assessed.  LVEDP 14 mmHg.    RECOMMENDATION:  Continue medical therapy.  Significant tortuosity in the right shoulder makes catheter manipulation difficult from the right radial.  If emergent cath was needed, consider alternate access site.  Cardiology follow-up with Dr. Delton See.

## 2013-11-02 NOTE — H&P (View-Only) (Signed)
Patient ID: [name] [name], female   DOB: 1935-07-25, 78 y.o.   MRN: [id number]     [name] Date of Encounter: 10/29/2013  Primary Care Provider:  No PCP Per Patient Primary Cardiologist:  Lars Masson  Problem List   Past Medical History  Diagnosis Date  . Left bundle branch block   . Asthma   . Thyroid disease   . Arthritis   . Pancreatitis, acute    Past Surgical History  Procedure Laterality Date  . Hernia repair    . Abdominal hysterectomy    . Cholecystectomy      Allergies  Allergies  Allergen Reactions  . Sulfa Antibiotics Hives    HPI  Patient is a 78 year old female with history of arthritis and asthma. She presents today for evaluation of chest pain.  She was found to have LBBB a year ago when she was undergoing preop evaluation for hernia surgery. She had a Lexiscan nuclear stress in [place] test that was negative. She has developed progressively worsening chest pain in the last two months. The pain is exertional and resolves at rest. Light physical activity can bring on the pain. There is radiation to the left arm.  On 10/11/2013 she went to the ER for chest pain that woke her from sleep. She described her discomfort as a "pressure" in the front of her chest that radiates into her neck. She felt nauseated but denied to me she is having any shortness of breath or diaphoresis. She came by EMS who gave her nitroglycerin and aspirin that helped her pain. ACS was ruled out and she was discharged home.   Her sister had myocardial infarction at age 34, father had CABG at 64, her daughter had myocardial infarction at age 45, per patient secondary to Prinzmetal angina.    Home Medications  Prior to Admission medications   Medication Sig Start Date End Date Taking? Authorizing Provider  amoxicillin (AMOXIL) 500 MG capsule Take 2,000 mg by mouth once. Before dental appointments 09/29/13  Yes Historical Provider, MD    aspirin-acetaminophen-caffeine (EXCEDRIN MIGRAINE) [phone] MG per tablet Take 1 tablet by mouth every 6 (six) hours as needed for headache or migraine.   Yes Historical Provider, MD  beta carotene w/minerals (OCUVITE) tablet Take 1 tablet by mouth daily.   Yes Historical Provider, MD  calcium carbonate (TUMS - DOSED IN MG ELEMENTAL CALCIUM) 500 MG chewable tablet Chew 1-4 tablets by mouth 3 (three) times daily as needed for indigestion or heartburn.   Yes Historical Provider, MD  Cholecalciferol (VITAMIN D PO) Take 1 tablet by mouth daily.   Yes Historical Provider, MD  naproxen (NAPROSYN) 500 MG tablet Take 500 mg by mouth 2 (two) times daily. 09/08/13  Yes Historical Provider, MD  ondansetron (ZOFRAN) 4 MG tablet Take 4 mg by mouth every 8 (eight) hours as needed for nausea or vomiting.   Yes Historical Provider, MD  PROAIR HFA 108 (90 BASE) MCG/ACT inhaler Inhale 1 puff into the lungs every 4 (four) hours as needed for wheezing or shortness of breath.  09/08/13  Yes Historical Provider, MD  SYNTHROID 100 MCG tablet Take 100 mcg by mouth daily. 08/27/13  Yes Historical Provider, MD    Family History  No family history on file.  Social History  History   Social History  . Marital Status: Married    Spouse Name: N/A    Number of Children: N/A  . Years of Education: N/A   Occupational  History  . Not on file.   Social History Main Topics  . Smoking status: Never Smoker   . Smokeless tobacco: Not on file  . Alcohol Use: Yes     Comment: occassionally  . Drug Use: No  . Sexual Activity: Not on file   Other Topics Concern  . Not on file   Social History Narrative  . No narrative on file     Review of Systems, as per HPI, otherwise negative General:  No chills, fever, night sweats or weight changes.  Cardiovascular:  No chest pain, dyspnea on exertion, edema, orthopnea, palpitations, paroxysmal nocturnal dyspnea. Dermatological: No rash, lesions/masses Respiratory: No cough,  dyspnea Urologic: No hematuria, dysuria Abdominal:   No nausea, vomiting, diarrhea, bright red blood per rectum, melena, or hematemesis Neurologic:  No visual changes, wkns, changes in mental status. All other systems reviewed and are otherwise negative except as noted above.  Physical Exam  Blood pressure 128/78, pulse 67, height 5' (1.524 m), weight 163 lb (73.936 kg).  General: Pleasant, NAD Psych: Normal affect. Neuro: Alert and oriented X 3. Moves all extremities spontaneously. HEENT: Normal  Neck: Supple without bruits or JVD. Lungs:  Resp regular and unlabored, CTA. Heart: RRR no s3, s4, or murmurs. Abdomen: Soft, non-tender, non-distended, BS + x 4.  Extremities: No clubbing, cyanosis or edema. DP/PT/Radials 2+ and equal bilaterally.  Labs:  No results found for this basename: CKTOTAL, CKMB, TROPONINI,  in the last 72 hours Lab Results  Component Value Date   WBC 6.8 10/11/2013   HGB 14.1 10/11/2013   HCT 40.1 10/11/2013   MCV 90.7 10/11/2013   PLT 189 10/11/2013    No results found for this basename: DDIMER   No components found with this basename: POCBNP,     Component Value Date/Time   NA 139 10/11/2013 1358   K 3.8 10/11/2013 1358   CL 100 10/11/2013 1358   CO2 25 10/11/2013 1358   GLUCOSE 107* 10/11/2013 1358   BUN 15 10/11/2013 1358   CREATININE 1.14* 10/11/2013 1358   CALCIUM 9.8 10/11/2013 1358   GFRNONAA 46* 10/11/2013 1358   GFRAA 53* 10/11/2013 1358   No results found for this basename: CHOL, HDL, LDLCALC, TRIG    Accessory Clinical Findings  Echocardiogram - none  ECG - SR, LBBB    Assessment & Plan  A very pleasant 77 year old female  1. Typical chest pain - risk factors include significant FH of premature CAD. We will schedule a cardiac cath for the next Tuesday 11/02/2013. We will start aspirin 81 mg po daily and sl NTG PRN.  2. BP - controlled  3. Lipids - unknown - we will check  Check pre-cath labs today including CBC, CMP, INR.   Follow  up after the cath.  Chanique Duca H, MD, FACC 10/29/2013, 9:08 AM     

## 2013-11-05 ENCOUNTER — Telehealth: Payer: Self-pay | Admitting: Cardiology

## 2013-11-05 DIAGNOSIS — I447 Left bundle-branch block, unspecified: Secondary | ICD-10-CM

## 2013-11-05 NOTE — Telephone Encounter (Signed)
New message     Talk to a nurse regarding cath follow appt and ultrasound

## 2013-11-05 NOTE — Telephone Encounter (Signed)
Cardiac cath done 10/27 by Dr. Eldridge Dace. He told both patient and husband that there were no blockages, but that they might consider having an echo done. She has followup with you in December, but wonders if this should be done prior to that visit.

## 2013-11-08 NOTE — Telephone Encounter (Signed)
Notified pt that Dr. Delton See wanted to do an Echo.  Will order and schedulers will call her with an appointment.

## 2013-11-08 NOTE — Telephone Encounter (Signed)
Could you schedule an echocardiogram ( Dg new LBBB) sometimes thi smonth? She has an appt with me on 12/4. Thank you, Aris Lot

## 2013-11-15 ENCOUNTER — Ambulatory Visit (HOSPITAL_COMMUNITY): Payer: Medicare Other | Attending: Internal Medicine | Admitting: Radiology

## 2013-11-15 ENCOUNTER — Telehealth: Payer: Self-pay | Admitting: *Deleted

## 2013-11-15 DIAGNOSIS — Z8249 Family history of ischemic heart disease and other diseases of the circulatory system: Secondary | ICD-10-CM | POA: Diagnosis not present

## 2013-11-15 DIAGNOSIS — I447 Left bundle-branch block, unspecified: Secondary | ICD-10-CM | POA: Diagnosis not present

## 2013-11-15 MED ORDER — LISINOPRIL 5 MG PO TABS: 5.0000 mg | ORAL_TABLET | Freq: Every day | ORAL | Status: DC

## 2013-11-15 NOTE — Progress Notes (Signed)
Echocardiogram performed.  

## 2013-11-15 NOTE — Telephone Encounter (Signed)
Informed the pt of her echo results showing decreased left ventricular function, and per Dr Delton See she needs to start on a med called Lisinopril 5 mg po daily, to help improve this.  Confirmed the pharmacy of choice with the pt.  Pt verbalized understanding and agrees with this plan.

## 2013-11-15 NOTE — Telephone Encounter (Signed)
-----   Message from Lars Masson, MD sent at 11/15/2013  4:23 PM EST ----- Her echocardiogram showed decreased left ventricular function and she needs to be started on medication that can help to recover it. We will start her on lisinopril 5 mg daily.

## 2013-11-16 ENCOUNTER — Telehealth: Payer: Self-pay | Admitting: Cardiology

## 2013-11-16 NOTE — Telephone Encounter (Signed)
Pt calling to ask what her EF was on her recent echo results, so she could tell her daughter in [place], who is also a Transport planner.  Informed the pt that per Dr Delton See her LVEF on her echo showed 30-35%.  Informed the pt that per Dr Delton See this is called decreased left ventricular function, which is why she recommended the pt start taking Lisinopril 5 mg po daily, which will help to recover it. Pt verbalized understanding and very gracious for all the assistance provided.

## 2013-11-16 NOTE — Telephone Encounter (Signed)
New problem   Pt need to speak to nurse concerning her test results. Please call pt.

## 2013-12-10 ENCOUNTER — Ambulatory Visit (INDEPENDENT_AMBULATORY_CARE_PROVIDER_SITE_OTHER): Payer: Medicare Other | Admitting: Cardiology

## 2013-12-10 ENCOUNTER — Encounter: Payer: Self-pay | Admitting: Cardiology

## 2013-12-10 VITALS — BP 110/56 | HR 83 | Ht 61.0 in | Wt 172.0 lb

## 2013-12-10 DIAGNOSIS — I429 Cardiomyopathy, unspecified: Secondary | ICD-10-CM

## 2013-12-10 DIAGNOSIS — I201 Angina pectoris with documented spasm: Secondary | ICD-10-CM

## 2013-12-10 DIAGNOSIS — I428 Other cardiomyopathies: Secondary | ICD-10-CM

## 2013-12-10 DIAGNOSIS — R072 Precordial pain: Secondary | ICD-10-CM

## 2013-12-10 DIAGNOSIS — E785 Hyperlipidemia, unspecified: Secondary | ICD-10-CM

## 2013-12-10 MED ORDER — AMLODIPINE BESYLATE 2.5 MG PO TABS: 2.5000 mg | ORAL_TABLET | Freq: Every day | ORAL | Status: DC

## 2013-12-10 NOTE — Patient Instructions (Signed)
**Note De-Identified [name] [name] Obfuscation** Your physician has recommended you make the following change in your medication: Start taking Amlodipine 2.5 mg daily and Magnesium 400 mg (over the counter) daily  Your physician recommends that you schedule a follow-up appointment in: January 2016

## 2013-12-10 NOTE — Progress Notes (Signed)
Patient ID: TAEA DELANGE, female   DOB: 07-05-1935, 78 y.o.   MRN: [id number] xxxxxxxxxxxxxxxxxxxxxxxxxxxxxxxxxxxxxxxx   [name] Date of Encounter: 12/10/2013  Primary Care Provider:  [name], MD Primary Cardiologist:  Lars Masson  Problem List   Past Medical History  Diagnosis Date  . Left bundle branch block   . Asthma   . Thyroid disease   . Arthritis   . Pancreatitis, acute   . Angina effort    Past Surgical History  Procedure Laterality Date  . Hernia repair    . Abdominal hysterectomy    . Cholecystectomy      Allergies  Allergies  Allergen Reactions  . Sulfa Antibiotics Hives    HPI  Patient is a 78 year old female with history of arthritis and asthma. She presents today for evaluation of chest pain.  She was found to have LBBB a year ago when she was undergoing preop evaluation for hernia surgery. She had a Lexiscan nuclear stress in [place] test that was negative. She has developed progressively worsening chest pain in the last two months. The pain is exertional and resolves at rest. Light physical activity can bring on the pain. There is radiation to the left arm.  On 10/11/2013 she went to the ER for chest pain that woke her from sleep. She described her discomfort as a "pressure" in the front of her chest that radiates into her neck. She felt nauseated but denied to me she is having any shortness of breath or diaphoresis. She came by EMS who gave her nitroglycerin and aspirin that helped her pain. ACS was ruled out and she was discharged home.   Her sister had myocardial infarction at age 58, father had CABG at 33, her daughter had myocardial infarction at age 37, per patient secondary to Prinzmetal angina.   12/10/2013 - the patient underwent a cardiac cath that was normal. Her echo showed LVEF 30-35% and she was started on Lisinopril 5 mg po daily. She has been experiencing chest pain radiating to her neck and shoulder  couple of mornings, resolved by sl NTG. Her daughter has Prinzmetal angina and she has h/o "tetanus like spasm" about 3 years ago, per patient, she had to immobilized in oredr to prevent bone breaks.  Home Medications  Prior to Admission medications   Medication Sig Start Date End Date Taking? Authorizing Provider  amoxicillin (AMOXIL) 500 MG capsule Take 2,000 mg by mouth once. Before dental appointments 09/29/13  Yes Historical Provider, MD  aspirin-acetaminophen-caffeine (EXCEDRIN MIGRAINE) [phone] MG per tablet Take 1 tablet by mouth every 6 (six) hours as needed for headache or migraine.   Yes Historical Provider, MD  beta carotene w/minerals (OCUVITE) tablet Take 1 tablet by mouth daily.   Yes Historical Provider, MD  calcium carbonate (TUMS - DOSED IN MG ELEMENTAL CALCIUM) 500 MG chewable tablet Chew 1-4 tablets by mouth 3 (three) times daily as needed for indigestion or heartburn.   Yes Historical Provider, MD  Cholecalciferol (VITAMIN D PO) Take 1 tablet by mouth daily.   Yes Historical Provider, MD  naproxen (NAPROSYN) 500 MG tablet Take 500 mg by mouth 2 (two) times daily. 09/08/13  Yes Historical Provider, MD  ondansetron (ZOFRAN) 4 MG tablet Take 4 mg by mouth every 8 (eight) hours as needed for nausea or vomiting.   Yes Historical Provider, MD  PROAIR HFA 108 (90 BASE) MCG/ACT inhaler Inhale 1 puff into the lungs every 4 (four) hours as needed for wheezing  or shortness of breath.  09/08/13  Yes Historical Provider, MD  SYNTHROID 100 MCG tablet Take 100 mcg by mouth daily. 08/27/13  Yes Historical Provider, MD   Lisinopril 5 mg po daily   Family History  Her sister had myocardial infarction at age 76, father had CABG at 7170, her daughter had myocardial infarction at age 76, per patient secondary to Prinzmetal angina.    Social History  History   Social History  . Marital Status: Married    Spouse Name: N/A    Number of Children: N/A  . Years of Education: N/A   Occupational  History  . Not on file.   Social History Main Topics  . Smoking status: Never Smoker   . Smokeless tobacco: Not on file  . Alcohol Use: Yes     Comment: occassionally  . Drug Use: No  . Sexual Activity: Not on file   Other Topics Concern  . Not on file   Social History Narrative     Review of Systems, as per HPI, otherwise negative General:  No chills, fever, night sweats or weight changes.  Cardiovascular:  No chest pain, dyspnea on exertion, edema, orthopnea, palpitations, paroxysmal nocturnal dyspnea. Dermatological: No rash, lesions/masses Respiratory: No cough, dyspnea Urologic: No hematuria, dysuria Abdominal:   No nausea, vomiting, diarrhea, bright red blood per rectum, melena, or hematemesis Neurologic:  No visual changes, wkns, changes in mental status. All other systems reviewed and are otherwise negative except as noted above.  Physical Exam  Blood pressure 110/56, pulse 83, height 5\' 1"  (1.549 m), weight 172 lb (78.019 kg), SpO2 96 %.  General: Pleasant, NAD Psych: Normal affect. Neuro: Alert and oriented X 3. Moves all extremities spontaneously. HEENT: Normal  Neck: Supple without bruits or JVD. Lungs:  Resp regular and unlabored, CTA. Heart: RRR no s3, s4, or murmurs. Abdomen: Soft, non-tender, non-distended, BS + x 4.  Extremities: No clubbing, cyanosis or edema. DP/PT/Radials 2+ and equal bilaterally.  Labs:  No results for input(s): CKTOTAL, CKMB, TROPONINI in the last 72 hours. Lab Results  Component Value Date   WBC 6.9 10/29/2013   HGB 13.3 10/29/2013   HCT 39.1 10/29/2013   MCV 91.3 10/29/2013   PLT 207.0 10/29/2013    No results found for: DDIMER Invalid input(s): POCBNP    Component Value Date/Time   NA 140 10/29/2013 1028   K 3.7 10/29/2013 1028   CL 106 10/29/2013 1028   CO2 26 10/29/2013 1028   GLUCOSE 94 10/29/2013 1028   BUN 20 10/29/2013 1028   CREATININE 0.9 10/29/2013 1028   CALCIUM 10.0 10/29/2013 1028   PROT 7.4  10/29/2013 1028   ALBUMIN 3.7 10/29/2013 1028   AST 23 10/29/2013 1028   ALT 12 10/29/2013 1028   ALKPHOS 63 10/29/2013 1028   BILITOT 0.7 10/29/2013 1028   GFRNONAA 46* 10/11/2013 1358   GFRAA 53* 10/11/2013 1358   No results found for: CHOL  Accessory Clinical Findings  Echocardiogram - none  ECG - SR, LBBB  Left cardiac cath: 11/02/2013 LEFT VENTRICULOGRAM: Left ventricular angiogram was not done because the pigtail catheter would not navigate the tortuosity in the right shoulder. Left heart catheterization was done with a multipurpose catheter. LVEDP was 14 mmHg.  IMPRESSIONS:  1. Widely patent left main coronary artery. 2. Widely patent left anterior descending artery and its branches. 3. Widely patent left circumflex artery and its branches. 4. Widely patent right coronary artery. 5. Left ventricular systolic function not assessed. LVEDP  14 mmHg. 6.   Echo: 11/15/2013 Study Conclusions  - Left ventricle: The cavity size was normal. Wall thickness was increased in a pattern of mild LVH. Systolic function was moderately to severely reduced. The estimated ejection fraction was in the range of 30% to 35%. Incoordinate septal motion. Inferoposterior hypokinesis to akinesis, apical hypokinesis. Doppler parameters are consistent with abnormal left ventricular relaxation (grade 1 diastolic dysfunction). The E/e&' ratio is >15, suggesting elevated LV filling pressure. - Aortic valve: Mildly calcified leaflets. Moderate aortic stenosis. Peak and mean gradients of 18 mmHg and 11 mmHg, respectively. These may be underestimated due to reduced LV systolic function. The calculated AVA, based on an LVOT diameter of 1.9 cm, is 1.4 cm2. There was mild regurgitation. - Mitral valve: Moderate posterior calcified annulus. There was moderate to severe regurgitation. - Left atrium: The atrium was normal in size. - Right ventricle: The cavity size was  normal. Mildly reduced systolic function. - Atrial septum: No defect or patent foramen ovale was identified. - Tricuspid valve: There was moderate regurgitation. - Pulmonary arteries: PA peak pressure: 33 mm Hg (S).  Impressions:  - LVEF 30-35%, inferior, posterior and apical wall motion abnormalities, mild RV dysfunction, diastolic dysfunction with elevated LV Filling pressure, moderate aortic stenosis, AVA around 1.4 cm2, moderate to severe MR with moderate posterior MAC, moderate TR, RVSP 33.   Assessment & Plan  A very pleasant 78 year old female  1. Chest pain - risk factors include significant FH of premature CAD. Cardiac cath showed normal coronaries, h/o consistent with spasms, we will start amlodipine 2.5 mg PO daily, her BP is rather low, if  Unable to tolerate we will switch to imdur 15-30 mg po daily. We will start magnesium oxide 400 mg po daily. Continue aspirin 81 mg po daily and sl NTG PRN.  2. Non-ischemic cardiomyopathy - LVEF 30-35%, started on lisinopril 5 mg po daily, tolerated well, we will check Crea, electrolytes today. We will order repeat echo in February 2016, if LVEF < 35% --> ICD.  3. BP - controlled  4. Lipids - unknown - we will check  Follow up in 1 month.  Lars Masson, MD, [facility] [place] 12/10/2013, 9:29 AM

## 2013-12-12 LAB — BASIC METABOLIC PANEL
BUN: 25 mg/dL — ABNORMAL HIGH (ref 6–23)
CO2: 25 mEq/L (ref 19–32)
Calcium: 10.1 mg/dL (ref 8.4–10.5)
Chloride: 106 mEq/L (ref 96–112)
Creatinine, Ser: 1.2 mg/dL (ref 0.4–1.2)
GFR: 48.76 mL/min — ABNORMAL LOW (ref >60.00)
Glucose, Bld: 91 mg/dL (ref 70–99)
Potassium: 4.5 mEq/L (ref 3.5–5.1)
Sodium: 140 mEq/L (ref 135–145)

## 2013-12-12 LAB — LIPID PANEL
Cholesterol: 197 mg/dL (ref 0–200)
HDL: 53.5 mg/dL (ref >39.00)
LDL Cholesterol: 130 mg/dL — ABNORMAL HIGH (ref 0–99)
NonHDL: 143.5
Total CHOL/HDL Ratio: 4
Triglycerides: 70 mg/dL (ref 0.0–149.0)
VLDL: 14 mg/dL (ref 0.0–40.0)

## 2013-12-16 ENCOUNTER — Encounter (HOSPITAL_COMMUNITY): Payer: Self-pay | Admitting: Interventional Cardiology

## 2014-01-12 ENCOUNTER — Ambulatory Visit (INDEPENDENT_AMBULATORY_CARE_PROVIDER_SITE_OTHER): Payer: Medicare Other | Admitting: Cardiology

## 2014-01-12 ENCOUNTER — Encounter: Payer: Self-pay | Admitting: Cardiology

## 2014-01-12 VITALS — BP 130/68 | HR 88 | Ht 61.0 in | Wt 169.8 lb

## 2014-01-12 DIAGNOSIS — Z8249 Family history of ischemic heart disease and other diseases of the circulatory system: Secondary | ICD-10-CM

## 2014-01-12 DIAGNOSIS — I509 Heart failure, unspecified: Secondary | ICD-10-CM

## 2014-01-12 DIAGNOSIS — I428 Other cardiomyopathies: Secondary | ICD-10-CM

## 2014-01-12 DIAGNOSIS — I5023 Acute on chronic systolic (congestive) heart failure: Secondary | ICD-10-CM

## 2014-01-12 DIAGNOSIS — R072 Precordial pain: Secondary | ICD-10-CM

## 2014-01-12 DIAGNOSIS — I429 Cardiomyopathy, unspecified: Secondary | ICD-10-CM

## 2014-01-12 DIAGNOSIS — I447 Left bundle-branch block, unspecified: Secondary | ICD-10-CM

## 2014-01-12 DIAGNOSIS — R079 Chest pain, unspecified: Secondary | ICD-10-CM

## 2014-01-12 LAB — COMPREHENSIVE METABOLIC PANEL
ALT: 11 U/L (ref 0–35)
AST: 20 U/L (ref 0–37)
Albumin: 4.2 g/dL (ref 3.5–5.2)
Alkaline Phosphatase: 54 U/L (ref 39–117)
BUN: 25 mg/dL — ABNORMAL HIGH (ref 6–23)
CO2: 24 mEq/L (ref 19–32)
Calcium: 9.7 mg/dL (ref 8.4–10.5)
Chloride: 107 mEq/L (ref 96–112)
Creatinine, Ser: 1 mg/dL (ref 0.4–1.2)
GFR: 59.33 mL/min — ABNORMAL LOW (ref >60.00)
Glucose, Bld: 96 mg/dL (ref 70–99)
Potassium: 4.2 mEq/L (ref 3.5–5.1)
Sodium: 137 mEq/L (ref 135–145)
Total Bilirubin: 0.6 mg/dL (ref 0.2–1.2)
Total Protein: 6.9 g/dL (ref 6.0–8.3)

## 2014-01-12 MED ORDER — CARVEDILOL 3.125 MG PO TABS: 3.1250 mg | ORAL_TABLET | Freq: Two times a day (BID) | ORAL | Status: DC

## 2014-01-12 MED ORDER — ISOSORBIDE MONONITRATE ER 30 MG PO TB24: 30.0000 mg | ORAL_TABLET | Freq: Every day | ORAL | Status: DC

## 2014-01-12 MED ORDER — FUROSEMIDE 20 MG PO TABS: 20.0000 mg | ORAL_TABLET | Freq: Every day | ORAL | Status: DC

## 2014-01-12 NOTE — Progress Notes (Signed)
Patient ID: GIORGIA WAHLER, female   DOB: 1935/02/03, 79 y.o.   MRN: [id number] Patient ID: JERIANNE ANSELMO, female   DOB: 06/29/1935, 79 y.o.   MRN: [id number] xxxxxxxxxxxxxxxxxxxxxxxxxxxxxxxxxxxxxxxx   Patient Name: HALLEIGH COMES Date of Encounter: 01/12/2014  Primary Care Provider:  [name], MD Primary Cardiologist:  Lars Masson  Problem List   Past Medical History  Diagnosis Date  . Left bundle branch block   . Asthma   . Thyroid disease   . Arthritis   . Pancreatitis, acute   . Angina effort    Past Surgical History  Procedure Laterality Date  . Hernia repair    . Abdominal hysterectomy    . Cholecystectomy    . Left heart catheterization with coronary angiogram N/A 11/02/2013    Procedure: LEFT HEART CATHETERIZATION WITH CORONARY ANGIOGRAM;  Surgeon: Corky Crafts, MD;  Location: [facility] CATH LAB;  Service: Cardiovascular;  Laterality: N/A;    Allergies  Allergies  Allergen Reactions  . Sulfa Antibiotics Hives    HPI  Patient is a 79 year old female with history of arthritis and asthma. She presents today for evaluation of chest pain.  She was found to have LBBB a year ago when she was undergoing preop evaluation for hernia surgery. She had a Lexiscan nuclear stress in [place] test that was negative. She has developed progressively worsening chest pain in the last two months. The pain is exertional and resolves at rest. Light physical activity can bring on the pain. There is radiation to the left arm.  On 10/11/2013 she went to the ER for chest pain that woke her from sleep. She described her discomfort as a "pressure" in the front of her chest that radiates into her neck. She felt nauseated but denied to me she is having any shortness of breath or diaphoresis. She came by EMS who gave her nitroglycerin and aspirin that helped her pain. ACS was ruled out and she was discharged home.   Her sister had myocardial infarction at age 38, father had CABG  at 60, her daughter had myocardial infarction at age 64, per patient secondary to Prinzmetal angina.   12/10/2013 - the patient underwent a cardiac cath that was normal. Her echo showed LVEF 30-35% and she was started on Lisinopril 5 mg po daily. She has been experiencing chest pain radiating to her neck and shoulder couple of mornings, resolved by sl NTG. Her daughter has Prinzmetal angina and she has h/o "tetanus like spasm" about 3 years ago, per patient, she had to immobilized in oredr to prevent bone breaks.  01/12/2013 - the patient is coming back and states that she has been feeling more short of breath especially with exertion and when carrying bags. This is associated palpitations and chest heaviness. She has also noticed some paroxysmal nocturnal dyspnea. No syncope. She is very compliant with her meds.  Home Medications  Prior to Admission medications   Medication Sig Start Date End Date Taking? Authorizing Provider  amoxicillin (AMOXIL) 500 MG capsule Take 2,000 mg by mouth once. Before dental appointments 09/29/13  Yes Historical Provider, MD  aspirin-acetaminophen-caffeine (EXCEDRIN MIGRAINE) [phone] MG per tablet Take 1 tablet by mouth every 6 (six) hours as needed for headache or migraine.   Yes Historical Provider, MD  beta carotene w/minerals (OCUVITE) tablet Take 1 tablet by mouth daily.   Yes Historical Provider, MD  calcium carbonate (TUMS - DOSED IN MG ELEMENTAL CALCIUM) 500 MG chewable tablet Chew 1-4 tablets by mouth  3 (three) times daily as needed for indigestion or heartburn.   Yes Historical Provider, MD  Cholecalciferol (VITAMIN D PO) Take 1 tablet by mouth daily.   Yes Historical Provider, MD  naproxen (NAPROSYN) 500 MG tablet Take 500 mg by mouth 2 (two) times daily. 09/08/13  Yes Historical Provider, MD  ondansetron (ZOFRAN) 4 MG tablet Take 4 mg by mouth every 8 (eight) hours as needed for nausea or vomiting.   Yes Historical Provider, MD  PROAIR HFA 108 (90 BASE)  MCG/ACT inhaler Inhale 1 puff into the lungs every 4 (four) hours as needed for wheezing or shortness of breath.  09/08/13  Yes Historical Provider, MD  SYNTHROID 100 MCG tablet Take 100 mcg by mouth daily. 08/27/13  Yes Historical Provider, MD   Lisinopril 5 mg po daily   Family History  Her sister had myocardial infarction at age 4, father had CABG at 43, her daughter had myocardial infarction at age 72, per patient secondary to Prinzmetal angina.    Social History  History   Social History  . Marital Status: Married    Spouse Name: N/A    Number of Children: N/A  . Years of Education: N/A   Occupational History  . Not on file.   Social History Main Topics  . Smoking status: Never Smoker   . Smokeless tobacco: Not on file  . Alcohol Use: Yes     Comment: occassionally  . Drug Use: No  . Sexual Activity: Not on file   Other Topics Concern  . Not on file   Social History Narrative     Review of Systems, as per HPI, otherwise negative General:  No chills, fever, night sweats or weight changes.  Cardiovascular:  No chest pain, dyspnea on exertion, edema, orthopnea, palpitations, paroxysmal nocturnal dyspnea. Dermatological: No rash, lesions/masses Respiratory: No cough, dyspnea Urologic: No hematuria, dysuria Abdominal:   No nausea, vomiting, diarrhea, bright red blood per rectum, melena, or hematemesis Neurologic:  No visual changes, wkns, changes in mental status. All other systems reviewed and are otherwise negative except as noted above.  Physical Exam  There were no vitals taken for this visit.  General: Pleasant, NAD Psych: Normal affect. Neuro: Alert and oriented X 3. Moves all extremities spontaneously. HEENT: Normal  Neck: Supple without bruits or JVD. Lungs:  Resp regular and unlabored, CTA. Heart: RRR no s3, s4, or murmurs. Abdomen: Soft, non-tender, non-distended, BS + x 4.  Extremities: No clubbing, cyanosis or edema. DP/PT/Radials 2+ and equal  bilaterally.  Labs:  No results for input(s): CKTOTAL, CKMB, TROPONINI in the last 72 hours. Lab Results  Component Value Date   WBC 6.9 10/29/2013   HGB 13.3 10/29/2013   HCT 39.1 10/29/2013   MCV 91.3 10/29/2013   PLT 207.0 10/29/2013    No results found for: DDIMER Invalid input(s): POCBNP    Component Value Date/Time   NA 140 12/10/2013 1005   K 4.5 12/10/2013 1005   CL 106 12/10/2013 1005   CO2 25 12/10/2013 1005   GLUCOSE 91 12/10/2013 1005   BUN 25* 12/10/2013 1005   CREATININE 1.2 12/10/2013 1005   CALCIUM 10.1 12/10/2013 1005   PROT 7.4 10/29/2013 1028   ALBUMIN 3.7 10/29/2013 1028   AST 23 10/29/2013 1028   ALT 12 10/29/2013 1028   ALKPHOS 63 10/29/2013 1028   BILITOT 0.7 10/29/2013 1028   GFRNONAA 46* 10/11/2013 1358   GFRAA 53* 10/11/2013 1358   Lab Results  Component Value Date  CHOL 197 12/10/2013    Accessory Clinical Findings  Echocardiogram - none  ECG - SR, LBBB  Left cardiac cath: 11/02/2013 LEFT VENTRICULOGRAM: Left ventricular angiogram was not done because the pigtail catheter would not navigate the tortuosity in the right shoulder. Left heart catheterization was done with a multipurpose catheter. LVEDP was 14 mmHg.  IMPRESSIONS:  1. Widely patent left main coronary artery. 2. Widely patent left anterior descending artery and its branches. 3. Widely patent left circumflex artery and its branches. 4. Widely patent right coronary artery. 5. Left ventricular systolic function not assessed. LVEDP 14 mmHg. 6.   Echo: 11/15/2013 Study Conclusions  - Left ventricle: The cavity size was normal. Wall thickness was increased in a pattern of mild LVH. Systolic function was moderately to severely reduced. The estimated ejection fraction was in the range of 30% to 35%. Incoordinate septal motion. Inferoposterior hypokinesis to akinesis, apical hypokinesis. Doppler parameters are consistent with abnormal left  ventricular relaxation (grade 1 diastolic dysfunction). The E/e&' ratio is >15, suggesting elevated LV filling pressure. - Aortic valve: Mildly calcified leaflets. Moderate aortic stenosis. Peak and mean gradients of 18 mmHg and 11 mmHg, respectively. These may be underestimated due to reduced LV systolic function. The calculated AVA, based on an LVOT diameter of 1.9 cm, is 1.4 cm2. There was mild regurgitation. - Mitral valve: Moderate posterior calcified annulus. There was moderate to severe regurgitation. - Left atrium: The atrium was normal in size. - Right ventricle: The cavity size was normal. Mildly reduced systolic function. - Atrial septum: No defect or patent foramen ovale was identified. - Tricuspid valve: There was moderate regurgitation. - Pulmonary arteries: PA peak pressure: 33 mm Hg (S).  Impressions:  - LVEF 30-35%, inferior, posterior and apical wall motion abnormalities, mild RV dysfunction, diastolic dysfunction with elevated LV Filling pressure, moderate aortic stenosis, AVA around 1.4 cm2, moderate to severe MR with moderate posterior MAC, moderate TR, RVSP 33.   Assessment & Plan  A very pleasant 79 year old female  1. Chest pain - risk factors include significant FH of premature CAD. Cardiac cath showed normal coronaries, h/o consistent with spasms, we started amlodipine 2.5 mg PO daily, worsening of symptoms, we will switch to Imdur 30 mg po daily. We will also start carvedilol 3.125 mg po daily.  Continue aspirin 81 mg po daily and sl NTG PRN.   2. Non-ischemic cardiomyopathy - LVEF 30-35%, started on lisinopril 5 mg po daily, tolerated well, we will check Crea, electrolytes today. Add carvedilol. We will order repeat echo in February 2016, if LVEF < 35% --> ICD.  3. Acute on chronic systolic CHF - appears euvolemic, however PND and elevated filling pressures on echo. Add lasix 20 mg po daily.  4. BP - controlled  4. Lipids -  LDL 130, add red yeast rice.  Follow up in 1 month.  Lars Masson, MD, [facility] 01/12/2014, 8:22 AM

## 2014-01-12 NOTE — Patient Instructions (Signed)
Your physician has recommended you make the following change in your medication:    STOP TAKING AMLODIPINE NOW  START TAKING RED YEAST RICE NOW PER DR NELSON----YOU CAN GET THIS OTC  START TAKING LASIX 20 MG ONCE DAILY  START IMDUR 30 MG ONCE DAILY  START TAKING COREG 3.125 MG TWICE DAILY   Your physician recommends that you return for lab work in: TODAY (CMET)   Your physician has requested that you have an echocardiogram. Echocardiography is a painless test that uses sound waves to create images of your heart. It provides your doctor with information about the size and shape of your heart and how well your heart's chambers and valves are working. This procedure takes approximately one hour. There are no restrictions for this procedure.  THIS SHOULD BE SCHEDULED FOR THE BEGINNING OF February 2016   Your physician recommends that you schedule a follow-up appointment in: WITH DR Delton See AFTER YOUR ECHO IN February 2016   DR NELSON WOULD LIKE FOR YOU TO CALL us IN ONE WEEK TO REPORT HOW YOU ARE FEELING

## 2014-02-15 ENCOUNTER — Ambulatory Visit (HOSPITAL_COMMUNITY): Payer: Medicare Other | Attending: Cardiology | Admitting: Radiology

## 2014-02-15 DIAGNOSIS — I509 Heart failure, unspecified: Secondary | ICD-10-CM | POA: Insufficient documentation

## 2014-02-15 DIAGNOSIS — Z8249 Family history of ischemic heart disease and other diseases of the circulatory system: Secondary | ICD-10-CM | POA: Diagnosis not present

## 2014-02-15 DIAGNOSIS — R072 Precordial pain: Secondary | ICD-10-CM

## 2014-02-15 DIAGNOSIS — I350 Nonrheumatic aortic (valve) stenosis: Secondary | ICD-10-CM | POA: Insufficient documentation

## 2014-02-15 DIAGNOSIS — I447 Left bundle-branch block, unspecified: Secondary | ICD-10-CM | POA: Insufficient documentation

## 2014-02-15 DIAGNOSIS — R079 Chest pain, unspecified: Secondary | ICD-10-CM

## 2014-02-15 NOTE — Progress Notes (Signed)
Echocardiogram performed.  

## 2014-02-22 ENCOUNTER — Telehealth: Payer: Self-pay | Admitting: *Deleted

## 2014-02-22 DIAGNOSIS — I447 Left bundle-branch block, unspecified: Secondary | ICD-10-CM

## 2014-02-22 DIAGNOSIS — R931 Abnormal findings on diagnostic imaging of heart and coronary circulation: Secondary | ICD-10-CM

## 2014-02-22 DIAGNOSIS — R0989 Other specified symptoms and signs involving the circulatory and respiratory systems: Secondary | ICD-10-CM

## 2014-02-22 DIAGNOSIS — I420 Dilated cardiomyopathy: Secondary | ICD-10-CM

## 2014-02-22 NOTE — Telephone Encounter (Signed)
-----   Message from Lars Masson, MD sent at 02/18/2014 10:44 AM EST ----- Her LVEF is borderline for ICD implantation, we will need to order cardiac MRI for accurate LVEF estimation for ICD implantation. Please place cardiomyopathy in the order.

## 2014-02-22 NOTE — Telephone Encounter (Signed)
Informed the pt that per Dr Delton See her echo showed that her LVEF is borderline for ICD Implantation, and Dr Delton See suggests that we order a cardiac Mri to evaluate for accurate LVEF for ICD Implantation.  Informed the pt that this is done at [facility] with Dr Delton See to read this study.  Informed the pt that a scheduler will call her to have this test scheduled at the hospital with Dr Delton See to do.  Pt verbalized understanding and agrees with this plan.

## 2014-03-01 ENCOUNTER — Ambulatory Visit: Payer: Medicare Other | Admitting: Cardiology

## 2014-03-01 ENCOUNTER — Ambulatory Visit (INDEPENDENT_AMBULATORY_CARE_PROVIDER_SITE_OTHER): Payer: Medicare Other | Admitting: Cardiology

## 2014-03-01 ENCOUNTER — Encounter: Payer: Self-pay | Admitting: Cardiology

## 2014-03-01 VITALS — BP 112/66 | HR 96 | Ht 61.0 in | Wt 162.0 lb

## 2014-03-01 DIAGNOSIS — I5023 Acute on chronic systolic (congestive) heart failure: Secondary | ICD-10-CM

## 2014-03-01 DIAGNOSIS — I429 Cardiomyopathy, unspecified: Secondary | ICD-10-CM

## 2014-03-01 DIAGNOSIS — R079 Chest pain, unspecified: Secondary | ICD-10-CM

## 2014-03-01 DIAGNOSIS — R002 Palpitations: Secondary | ICD-10-CM

## 2014-03-01 DIAGNOSIS — I428 Other cardiomyopathies: Secondary | ICD-10-CM

## 2014-03-01 MED ORDER — CARVEDILOL 6.25 MG PO TABS: 6.2500 mg | ORAL_TABLET | Freq: Two times a day (BID) | ORAL | Status: DC

## 2014-03-01 NOTE — Progress Notes (Signed)
Patient ID: YENESIS EVEN, female   DOB: [birth year], 79 y.o.   MRN: [id number] xxxxxxxxxxxxxxxxxxxxxxxxxxxxxxxxxxxxxxxx    [name] Date of Encounter: 03/01/2014  Primary Care Provider:  [name], MD Primary Cardiologist:  Lars Masson  Problem List   Past Medical History  Diagnosis Date  . Left bundle branch block   . Asthma   . Thyroid disease   . Arthritis   . Pancreatitis, acute   . Angina effort    Past Surgical History  Procedure Laterality Date  . Hernia repair    . Abdominal hysterectomy    . Cholecystectomy    . Left heart catheterization with coronary angiogram N/A 11/02/2013    Procedure: LEFT HEART CATHETERIZATION WITH CORONARY ANGIOGRAM;  Surgeon: Corky Crafts, MD;  Location: [facility] CATH LAB;  Service: Cardiovascular;  Laterality: N/A;    Allergies  Allergies  Allergen Reactions  . Sulfa Antibiotics Hives    HPI  Patient is a 79 year old female with history of arthritis and asthma. She presents today for evaluation of chest pain.  She was found to have LBBB a year ago when she was undergoing preop evaluation for hernia surgery. She had a Lexiscan nuclear stress in [place] test that was negative. She has developed progressively worsening chest pain in the last two months. The pain is exertional and resolves at rest. Light physical activity can bring on the pain. There is radiation to the left arm.  On 10/11/2013 she went to the ER for chest pain that woke her from sleep. She described her discomfort as a "pressure" in the front of her chest that radiates into her neck. She felt nauseated but denied to me she is having any shortness of breath or diaphoresis. She came by EMS who gave her nitroglycerin and aspirin that helped her pain. ACS was ruled out and she was discharged home.   Her sister had myocardial infarction at age 46, father had CABG at 34, her daughter had myocardial infarction at age 89, per patient  secondary to Prinzmetal angina.   12/10/2013 - the patient underwent a cardiac cath that was normal. Her echo showed LVEF 30-35% and she was started on Lisinopril 5 mg po daily. She has been experiencing chest pain radiating to her neck and shoulder couple of mornings, resolved by sl NTG. Her daughter has Prinzmetal angina and she has h/o "tetanus like spasm" about 3 years ago, per patient, she had to immobilized in oredr to prevent bone breaks.  01/12/2013 - the patient is coming back and states that she has been feeling more short of breath especially with exertion and when carrying bags. This is associated palpitations and chest heaviness. She has also noticed some paroxysmal nocturnal dyspnea. No syncope. She is very compliant with her meds.  03/01/2014 - the patient is coming after 1 months, she doesn't seem to be improved with her symptoms, she states that walking one or 2 flight of stairs make her significantly short of breath. Her husband history worrying about her and doesn't like her to do anything strenuous. With exertion she also feels chest tightness. Amlodipine didn't help her much so we switched to Imdur with no significant improvement. She feels almost daily palpitations that are associated with nausea. The palpitations last seconds to minutes. She is no lower extremity edema no orthopnea and no paroxysmal nocturnal dyspnea.  Home Medications  Prior to Admission medications   Medication Sig Start Date End Date Taking? Authorizing Provider  amoxicillin (AMOXIL)  500 MG capsule Take 2,000 mg by mouth once. Before dental appointments 09/29/13  Yes Historical Provider, MD  aspirin-acetaminophen-caffeine (EXCEDRIN MIGRAINE) [phone] MG per tablet Take 1 tablet by mouth every 6 (six) hours as needed for headache or migraine.   Yes Historical Provider, MD  beta carotene w/minerals (OCUVITE) tablet Take 1 tablet by mouth daily.   Yes Historical Provider, MD  calcium carbonate (TUMS - DOSED IN  MG ELEMENTAL CALCIUM) 500 MG chewable tablet Chew 1-4 tablets by mouth 3 (three) times daily as needed for indigestion or heartburn.   Yes Historical Provider, MD  Cholecalciferol (VITAMIN D PO) Take 1 tablet by mouth daily.   Yes Historical Provider, MD  naproxen (NAPROSYN) 500 MG tablet Take 500 mg by mouth 2 (two) times daily. 09/08/13  Yes Historical Provider, MD  ondansetron (ZOFRAN) 4 MG tablet Take 4 mg by mouth every 8 (eight) hours as needed for nausea or vomiting.   Yes Historical Provider, MD  PROAIR HFA 108 (90 BASE) MCG/ACT inhaler Inhale 1 puff into the lungs every 4 (four) hours as needed for wheezing or shortness of breath.  09/08/13  Yes Historical Provider, MD  SYNTHROID 100 MCG tablet Take 100 mcg by mouth daily. 08/27/13  Yes Historical Provider, MD   Lisinopril 5 mg po daily   Family History  Her sister had myocardial infarction at age 43, father had CABG at 54, her daughter had myocardial infarction at age 39, per patient secondary to Prinzmetal angina.    Social History  History   Social History  . Marital Status: Married    Spouse Name: N/A  . Number of Children: N/A  . Years of Education: N/A   Occupational History  . Not on file.   Social History Main Topics  . Smoking status: Never Smoker   . Smokeless tobacco: Not on file  . Alcohol Use: Yes     Comment: occassionally  . Drug Use: No  . Sexual Activity: Not on file   Other Topics Concern  . Not on file   Social History Narrative     Review of Systems, as per HPI, otherwise negative General:  No chills, fever, night sweats or weight changes.  Cardiovascular:  No chest pain, dyspnea on exertion, edema, orthopnea, palpitations, paroxysmal nocturnal dyspnea. Dermatological: No rash, lesions/masses Respiratory: No cough, dyspnea Urologic: No hematuria, dysuria Abdominal:   No nausea, vomiting, diarrhea, bright red blood per rectum, melena, or hematemesis Neurologic:  No visual changes, wkns, changes  in mental status. All other systems reviewed and are otherwise negative except as noted above.  Physical Exam  Blood pressure 112/66, pulse 96, height  (1.549 m), weight 162 lb (73.483 kg), SpO2 99 %.  General: Pleasant, NAD Psych: Normal affect. Neuro: Alert and oriented X 3. Moves all extremities spontaneously. HEENT: Normal  Neck: Supple without bruits or JVD. Lungs:  Resp regular and unlabored, CTA. Heart: RRR no s3, s4, or murmurs. Abdomen: Soft, non-tender, non-distended, BS + x 4.  Extremities: No clubbing, cyanosis or edema. DP/PT/Radials 2+ and equal bilaterally.  Labs:  No results for input(s): CKTOTAL, CKMB, TROPONINI in the last 72 hours. Lab Results  Component Value Date   WBC 6.9 10/29/2013   HGB 13.3 10/29/2013   HCT 39.1 10/29/2013   MCV 91.3 10/29/2013   PLT 207.0 10/29/2013    No results found for: DDIMER Invalid input(s): POCBNP    Component Value Date/Time   NA 137 01/12/2014 0911   K 4.2 01/12/2014 0911  CL 107 01/12/2014 0911   CO2 24 01/12/2014 0911   GLUCOSE 96 01/12/2014 0911   BUN 25* 01/12/2014 0911   CREATININE 1.0 01/12/2014 0911   CALCIUM 9.7 01/12/2014 0911   PROT 6.9 01/12/2014 0911   ALBUMIN 4.2 01/12/2014 0911   AST 20 01/12/2014 0911   ALT 11 01/12/2014 0911   ALKPHOS 54 01/12/2014 0911   BILITOT 0.6 01/12/2014 0911   GFRNONAA 46* 10/11/2013 1358   GFRAA 53* 10/11/2013 1358   Lab Results  Component Value Date   CHOL 197 12/10/2013    Accessory Clinical Findings  Echocardiogram - none  ECG - SR, LBBB  Left cardiac cath: 11/02/2013 LEFT VENTRICULOGRAM: Left ventricular angiogram was not done because the pigtail catheter would not navigate the tortuosity in the right shoulder. Left heart catheterization was done with a multipurpose catheter. LVEDP was 14 mmHg.  IMPRESSIONS:  1. Widely patent left main coronary artery. 2. Widely patent left anterior descending artery and its branches. 3. Widely patent left  circumflex artery and its branches. 4. Widely patent right coronary artery. 5. Left ventricular systolic function not assessed. LVEDP 14 mmHg. 6.   Echo: 11/15/2013 Study Conclusions  - Left ventricle: The cavity size was normal. Wall thickness was increased in a pattern of mild LVH. Systolic function was moderately to severely reduced. The estimated ejection fraction was in the range of 30% to 35%. Incoordinate septal motion. Inferoposterior hypokinesis to akinesis, apical hypokinesis. Doppler parameters are consistent with abnormal left ventricular relaxation (grade 1 diastolic dysfunction). The E/e&' ratio is >15, suggesting elevated LV filling pressure. - Aortic valve: Mildly calcified leaflets. Moderate aortic stenosis. Peak and mean gradients of 18 mmHg and 11 mmHg, respectively. These may be underestimated due to reduced LV systolic function. The calculated AVA, based on an LVOT diameter of 1.9 cm, is 1.4 cm2. There was mild regurgitation. - Mitral valve: Moderate posterior calcified annulus. There was moderate to severe regurgitation. - Left atrium: The atrium was normal in size. - Right ventricle: The cavity size was normal. Mildly reduced systolic function. - Atrial septum: No defect or patent foramen ovale was identified. - Tricuspid valve: There was moderate regurgitation. - Pulmonary arteries: PA peak pressure: 33 mm Hg (S).  Impressions:  - LVEF 30-35%, inferior, posterior and apical wall motion abnormalities, mild RV dysfunction, diastolic dysfunction with elevated LV Filling pressure, moderate aortic stenosis, AVA around 1.4 cm2, moderate to severe MR with moderate posterior MAC, moderate TR, RVSP 33.  02/15/2014 Left ventricle: E/e&'>15.7 suggestive of elevated LV filling pressures. The cavity size was normal. Systolic function was moderately reduced. The estimated ejection fraction was in the range of 35% to 40%.  Diffuse hypokinesis. There is severe hypokinesis of the entireinferior and inferoseptal myocardium. There was an increased relative contribution of atrial contraction to ventricular filling. Doppler parameters are consistent with abnormal left ventricular relaxation (grade 1 diastolic dysfunction). - Aortic valve: Moderate diffuse thickening and calcification. Valve mobility was restricted. There was at least mild stenosis but the AV velocity and mean gradient may be underestimated due to reduced LVF. There was mild regurgitation. Mean gradient (S): 11 mm Hg. Peak gradient (S): 19 mm Hg. - Mitral valve: Moderately calcified annulus. Moderate diffuse thickening and calcification of the anterior leaflet and posterior leaflet, with moderate involvement of chords. There was mild regurgitation. - Tricuspid valve: There was mild regurgitation. - Pulmonary arteries: PA peak pressure: 31 mm Hg (S).   Assessment & Plan  A very pleasant 79 year old female  1. Non-ischemic cardiomyopathy - LVEF 30-35%, started on lisinopril 5 mg po daily, tolerated well, we will check Crea, electrolytes today. Increase carvedilol to 6.25 mg orally twice a day. The patient underwent repeat echocardiogram on 02/15/2014 with estimated left ventricular ejection fraction 35-40%. Upon reviewing her echocardiogram today for skin EF might be interobserver variability. Since echocardiogram is very subjective measure of left-ventricular ejection fraction or cardiac MRI to accurately estimate left ventricular ejection fraction in order to be able to decide whether she qualifies for ICD implantation or not. At this point we are increasing carvedilol, her blood pressure wouldn't allow further increase of ACE inhibitor. I would stop Lasix as she is euvolemic.  2. Chest pain - risk factors include significant FH of premature CAD. Cardiac cath showed normal coronaries, h/o consistent with spasms, we started  amlodipine 2.5 mg PO daily, worsening of symptoms, then switched to Imdur 30 mg po daily without significant improvement. Continue aspirin 81 mg po daily and sl NTG PRN.  Part of her problem might be significant deconditioning. We will refer patient for cardiac rehabilitation.  3. Acute on chronic systolic CHF - appears euvolemic, management as above  4. Hypertension - controlled  5. Palpitations - associated with nausea and shortness of breath, we will increase coronal 6.25 mg twice a day and perform 48 hour Holter monitor to evaluate for arrhythmias.  6. Lipids - LDL 130, add red yeast rice.  Follow up in 1 month.  Lars Masson, MD, [facility] 03/01/2014, 1:38 PM

## 2014-03-01 NOTE — Patient Instructions (Signed)
Your physician has recommended you make the following change in your medication:   STOP TAKING LASIX NOW  INCREASE YOUR CARVEDILOL TO 6.25 MG TWICE DAILY   Your physician has recommended that you wear a 48 HOUR holter monitor. Holter monitors are medical devices that record the heart's electrical activity. Doctors most often use these monitors to diagnose arrhythmias. Arrhythmias are problems with the speed or rhythm of the heartbeat. The monitor is a small, portable device. You can wear one while you do your normal daily activities. This is usually used to diagnose what is causing palpitations/syncope (passing out).  DR Delton See FOR YOU TO HAVE A CARDIAC MRI ORDERED ON 02/22/14.  PLEASE GO TO CHECKOUT TO CHECK THE STATUS OF THIS ORDER.   You have been referred to CARDIAC REHAB AT [facility]    Your physician recommends that you schedule a follow-up appointment in: ONE MONTH WITH DR Delton See

## 2014-03-04 ENCOUNTER — Encounter: Payer: Self-pay | Admitting: Cardiology

## 2014-03-08 ENCOUNTER — Encounter: Payer: Self-pay | Admitting: *Deleted

## 2014-03-08 ENCOUNTER — Encounter (INDEPENDENT_AMBULATORY_CARE_PROVIDER_SITE_OTHER): Payer: Medicare Other

## 2014-03-08 ENCOUNTER — Telehealth: Payer: Self-pay | Admitting: *Deleted

## 2014-03-08 DIAGNOSIS — I447 Left bundle-branch block, unspecified: Secondary | ICD-10-CM

## 2014-03-08 DIAGNOSIS — I429 Cardiomyopathy, unspecified: Secondary | ICD-10-CM

## 2014-03-08 DIAGNOSIS — I428 Other cardiomyopathies: Secondary | ICD-10-CM

## 2014-03-08 DIAGNOSIS — R079 Chest pain, unspecified: Secondary | ICD-10-CM

## 2014-03-08 DIAGNOSIS — R002 Palpitations: Secondary | ICD-10-CM

## 2014-03-08 DIAGNOSIS — I5023 Acute on chronic systolic (congestive) heart failure: Secondary | ICD-10-CM

## 2014-03-08 DIAGNOSIS — Z01812 Encounter for preprocedural laboratory examination: Secondary | ICD-10-CM

## 2014-03-08 NOTE — Progress Notes (Signed)
Patient ID: [name] [name], female   DOB: [birth year], 79 y.o.   MRN: [id number] [employer] 48 hour holter monitor applied to patient.

## 2014-03-08 NOTE — Telephone Encounter (Signed)
-----   Message from Sampson Goon sent at 03/04/2014  9:14 AM EST ----- Regarding: Cardiac MRI    [name] [name]  The MRI has been scheduled for this patient for 03-17-14.  She will need orders for labs to be done prior to the MRI.  Thanks [employer]

## 2014-03-08 NOTE — Telephone Encounter (Signed)
Informed the pt that her cardiac MRI is scheduled for 03/17/14 and she will need to come in for labs prior to this test sometime this week.  Scheduled the pt a lab appt for 03/10/14 at our office to check a bmet prior to cardiac mri.  Pt verbalized understanding and agrees with this plan.

## 2014-03-10 ENCOUNTER — Other Ambulatory Visit (INDEPENDENT_AMBULATORY_CARE_PROVIDER_SITE_OTHER): Payer: Medicare Other | Admitting: *Deleted

## 2014-03-10 DIAGNOSIS — Z01812 Encounter for preprocedural laboratory examination: Secondary | ICD-10-CM

## 2014-03-10 DIAGNOSIS — R079 Chest pain, unspecified: Secondary | ICD-10-CM

## 2014-03-10 DIAGNOSIS — I447 Left bundle-branch block, unspecified: Secondary | ICD-10-CM

## 2014-03-10 LAB — BASIC METABOLIC PANEL
BUN: 24 mg/dL — ABNORMAL HIGH (ref 6–23)
CO2: 29 mEq/L (ref 19–32)
Calcium: 9.4 mg/dL (ref 8.4–10.5)
Chloride: 104 mEq/L (ref 96–112)
Creatinine, Ser: 1.03 mg/dL (ref 0.40–1.20)
GFR: 55.34 mL/min — ABNORMAL LOW (ref >60.00)
Glucose, Bld: 89 mg/dL (ref 70–99)
Potassium: 4.1 mEq/L (ref 3.5–5.1)
Sodium: 137 mEq/L (ref 135–145)

## 2014-03-11 ENCOUNTER — Telehealth: Payer: Self-pay | Admitting: *Deleted

## 2014-03-11 MED ORDER — LISINOPRIL 5 MG PO TABS: 5.0000 mg | ORAL_TABLET | Freq: Every day | ORAL | Status: DC

## 2014-03-11 NOTE — Telephone Encounter (Signed)
Refill on lisinopril requested.  Sent the refill to pharmacy of choice.  Informed pt of her stable labs, normal electrolytes, and stable creatinine per Dr Delton See.  Pt verbalized understanding.

## 2014-03-11 NOTE — Telephone Encounter (Signed)
-----   Message from Lars Masson, MD sent at 03/11/2014 11:19 AM EST ----- Stable labs, normal electrolytes, stable Creatinine

## 2014-03-17 ENCOUNTER — Ambulatory Visit (HOSPITAL_COMMUNITY)
Admission: RE | Admit: 2014-03-17 | Discharge: 2014-03-17 | Disposition: A | Discharge status: Home / Self Care | Payer: Medicare Other | Source: Ambulatory Visit | Attending: Cardiology | Admitting: Cardiology

## 2014-03-17 DIAGNOSIS — R0989 Other specified symptoms and signs involving the circulatory and respiratory systems: Secondary | ICD-10-CM | POA: Diagnosis not present

## 2014-03-17 DIAGNOSIS — R931 Abnormal findings on diagnostic imaging of heart and coronary circulation: Secondary | ICD-10-CM

## 2014-03-17 DIAGNOSIS — I447 Left bundle-branch block, unspecified: Secondary | ICD-10-CM | POA: Insufficient documentation

## 2014-03-17 DIAGNOSIS — I429 Cardiomyopathy, unspecified: Secondary | ICD-10-CM

## 2014-03-17 DIAGNOSIS — I420 Dilated cardiomyopathy: Secondary | ICD-10-CM | POA: Insufficient documentation

## 2014-03-17 MED ORDER — GADOBENATE DIMEGLUMINE 529 MG/ML IV SOLN
25.0000 mL | Freq: Once | INTRAVENOUS | Status: AC | PRN
  Administered 2014-03-17: 25 mL via INTRAVENOUS

## 2014-03-18 ENCOUNTER — Other Ambulatory Visit: Payer: Self-pay | Admitting: *Deleted

## 2014-03-31 ENCOUNTER — Encounter: Payer: Self-pay | Admitting: Cardiology

## 2014-03-31 ENCOUNTER — Ambulatory Visit (INDEPENDENT_AMBULATORY_CARE_PROVIDER_SITE_OTHER): Payer: Medicare Other | Admitting: Cardiology

## 2014-03-31 VITALS — BP 124/64 | HR 85 | Ht 61.0 in | Wt 168.0 lb

## 2014-03-31 DIAGNOSIS — R072 Precordial pain: Secondary | ICD-10-CM

## 2014-03-31 DIAGNOSIS — I420 Dilated cardiomyopathy: Secondary | ICD-10-CM

## 2014-03-31 DIAGNOSIS — I5023 Acute on chronic systolic (congestive) heart failure: Secondary | ICD-10-CM

## 2014-03-31 DIAGNOSIS — R002 Palpitations: Secondary | ICD-10-CM | POA: Insufficient documentation

## 2014-03-31 DIAGNOSIS — I447 Left bundle-branch block, unspecified: Secondary | ICD-10-CM

## 2014-03-31 HISTORY — DX: Dilated cardiomyopathy: I42.0

## 2014-03-31 MED ORDER — CARVEDILOL 12.5 MG PO TABS: 12.5000 mg | ORAL_TABLET | Freq: Every day | ORAL | Status: DC

## 2014-03-31 NOTE — Progress Notes (Signed)
Patient ID: JOLENE GUYETT, female   DOB: 06/30/1935, 79 y.o.   MRN: [id number]    [name] Date of Encounter: 03/31/2014  Primary Care Provider:  [name], MD Primary Cardiologist:  Lars Masson   Chief complain: DOE, palpitations  Problem List   Past Medical History  Diagnosis Date  . Left bundle branch block   . Asthma   . Thyroid disease   . Arthritis   . Pancreatitis, acute   . Angina effort    Past Surgical History  Procedure Laterality Date  . Hernia repair    . Abdominal hysterectomy    . Cholecystectomy    . Left heart catheterization with coronary angiogram N/A 11/02/2013    Procedure: LEFT HEART CATHETERIZATION WITH CORONARY ANGIOGRAM;  Surgeon: Corky Crafts, MD;  Location: [facility] CATH LAB;  Service: Cardiovascular;  Laterality: N/A;    Allergies  Allergies  Allergen Reactions  . Sulfa Antibiotics Hives    HPI  Patient is a 79 year old female with history of arthritis and asthma. She was incidentally found to have LBBB prior to the knee surgery and underwent a Lexiscan nuclear stress in [place] test that was negative. She then developed progressively worsening chest pain and on 10/11/2013 she went to the ER for chest pain that woke her from sleep. ACS was ruled out and she was discharged home.  She continued to have exertional dyspnea and in December 2015 underwent a cardiac cath that was normal. Her echo showed LVEF 30-35% and she was started on Lisinopril 5 mg po daily. She has been experiencing chest pain radiating to her neck and shoulder couple of mornings, resolved by sl NTG. Her daughter has Prinzmetal angina and she has h/o "tetanus like spasm" about 3 years ago, per patient, she had to immobilized in order to prevent bone breaks.  Since initiation of lisinopril and carvedilol her LVEF and symptoms remained the same.  She continues to have NYHA class III symptoms and she underwent cardiac MRI on 03/17/2014 that  showed LVEF 37%.  She is very compliant with her meds. She is experiencing significant palpitations that last few seconds to minutes and are associated with dizziness and nausea. Holter monitor showed 2200 unifocal PVCs with no VT runs.  She is experiencing PND 2-3x/month, no LE edema, 2 pillow orthopnea.  With exertion she also feels chest tightness. Amlodipine didn't help her much so we switched to Imdur with no significant improvement.   Home Medications  Prior to Admission medications   Medication Sig Start Date End Date Taking? Authorizing Provider  amoxicillin (AMOXIL) 500 MG capsule Take 2,000 mg by mouth once. Before dental appointments 09/29/13  Yes Historical Provider, MD  aspirin-acetaminophen-caffeine (EXCEDRIN MIGRAINE) [id number] MG per tablet Take 1 tablet by mouth every 6 (six) hours as needed for headache or migraine.   Yes Historical Provider, MD  beta carotene w/minerals (OCUVITE) tablet Take 1 tablet by mouth daily.   Yes Historical Provider, MD  calcium carbonate (TUMS - DOSED IN MG ELEMENTAL CALCIUM) 500 MG chewable tablet Chew 1-4 tablets by mouth 3 (three) times daily as needed for indigestion or heartburn.   Yes Historical Provider, MD  Cholecalciferol (VITAMIN D PO) Take 1 tablet by mouth daily.   Yes Historical Provider, MD  naproxen (NAPROSYN) 500 MG tablet Take 500 mg by mouth 2 (two) times daily. 09/08/13  Yes Historical Provider, MD  ondansetron (ZOFRAN) 4 MG tablet Take 4 mg by mouth every 8 (eight) hours as  needed for nausea or vomiting.   Yes Historical Provider, MD  PROAIR HFA 108 (90 BASE) MCG/ACT inhaler Inhale 1 puff into the lungs every 4 (four) hours as needed for wheezing or shortness of breath.  09/08/13  Yes Historical Provider, MD  SYNTHROID 100 MCG tablet Take 100 mcg by mouth daily. 08/27/13  Yes Historical Provider, MD   Lisinopril 5 mg po daily   Family History  Her sister had myocardial infarction at age 53, father had CABG at 41, her daughter had  myocardial infarction at age 46, per patient secondary to Prinzmetal angina.    Social History  History   Social History  . Marital Status: Married    Spouse Name: N/A  . Number of Children: N/A  . Years of Education: N/A   Occupational History  . Not on file.   Social History Main Topics  . Smoking status: Never Smoker   . Smokeless tobacco: Not on file  . Alcohol Use: Yes     Comment: occassionally  . Drug Use: No  . Sexual Activity: Not on file   Other Topics Concern  . Not on file   Social History Narrative     Review of Systems, as per HPI, otherwise negative General:  No chills, fever, night sweats or weight changes.  Cardiovascular:  No chest pain, dyspnea on exertion, edema, orthopnea, palpitations, paroxysmal nocturnal dyspnea. Dermatological: No rash, lesions/masses Respiratory: No cough, dyspnea Urologic: No hematuria, dysuria Abdominal:   No nausea, vomiting, diarrhea, bright red blood per rectum, melena, or hematemesis Neurologic:  No visual changes, wkns, changes in mental status. All other systems reviewed and are otherwise negative except as noted above.  Physical Exam  BP 110/56 mmHg, HR 83 BPM General: Pleasant, NAD Psych: Normal affect. Neuro: Alert and oriented X 3. Moves all extremities spontaneously. HEENT: Normal  Neck: Supple without bruits or JVD. Lungs:  Resp regular and unlabored, CTA. Heart: RRR no s3, s4, or murmurs. Abdomen: Soft, non-tender, non-distended, BS + x 4.  Extremities: No clubbing, cyanosis or edema. DP/PT/Radials 2+ and equal bilaterally.  Labs:  No results for input(s): CKTOTAL, CKMB, TROPONINI in the last 72 hours. Lab Results  Component Value Date   WBC 6.9 10/29/2013   HGB 13.3 10/29/2013   HCT 39.1 10/29/2013   MCV 91.3 10/29/2013   PLT 207.0 10/29/2013    No results found for: DDIMER Invalid input(s): POCBNP    Component Value Date/Time   NA 137 03/10/2014 0959   K 4.1 03/10/2014 0959   CL 104  03/10/2014 0959   CO2 29 03/10/2014 0959   GLUCOSE 89 03/10/2014 0959   BUN 24* 03/10/2014 0959   CREATININE 1.03 03/10/2014 0959   CALCIUM 9.4 03/10/2014 0959   PROT 6.9 01/12/2014 0911   ALBUMIN 4.2 01/12/2014 0911   AST 20 01/12/2014 0911   ALT 11 01/12/2014 0911   ALKPHOS 54 01/12/2014 0911   BILITOT 0.6 01/12/2014 0911   GFRNONAA 46* 10/11/2013 1358   GFRAA 53* 10/11/2013 1358   Lab Results  Component Value Date   CHOL 197 12/10/2013    Accessory Clinical Findings  Echocardiogram - none  ECG - SR, LBBB  Left cardiac cath: 11/02/2013 LEFT VENTRICULOGRAM: Left ventricular angiogram was not done because the pigtail catheter would not navigate the tortuosity in the right shoulder. Left heart catheterization was done with a multipurpose catheter. LVEDP was 14 mmHg.  IMPRESSIONS:  1. Widely patent left main coronary artery. 2. Widely patent left anterior descending  artery and its branches. 3. Widely patent left circumflex artery and its branches. 4. Widely patent right coronary artery. 5. Left ventricular systolic function not assessed. LVEDP 14 mmHg. 6.   Echo: 11/15/2013 Study Conclusions  - Left ventricle: The cavity size was normal. Wall thickness was increased in a pattern of mild LVH. Systolic function was moderately to severely reduced. The estimated ejection fraction was in the range of 30% to 35%. Incoordinate septal motion. Inferoposterior hypokinesis to akinesis, apical hypokinesis. Doppler parameters are consistent with abnormal left ventricular relaxation (grade 1 diastolic dysfunction). The E/e&' ratio is >15, suggesting elevated LV filling pressure. - Aortic valve: Mildly calcified leaflets. Moderate aortic stenosis. Peak and mean gradients of 18 mmHg and 11 mmHg, respectively. These may be underestimated due to reduced LV systolic function. The calculated AVA, based on an LVOT diameter of 1.9 cm, is 1.4 cm2. There was mild  regurgitation. - Mitral valve: Moderate posterior calcified annulus. There was moderate to severe regurgitation. - Left atrium: The atrium was normal in size. - Right ventricle: The cavity size was normal. Mildly reduced systolic function. - Atrial septum: No defect or patent foramen ovale was identified. - Tricuspid valve: There was moderate regurgitation. - Pulmonary arteries: PA peak pressure: 33 mm Hg (S).  Impressions:  - LVEF 30-35%, inferior, posterior and apical wall motion abnormalities, mild RV dysfunction, diastolic dysfunction with elevated LV Filling pressure, moderate aortic stenosis, AVA around 1.4 cm2, moderate to severe MR with moderate posterior MAC, moderate TR, RVSP 33.  02/15/2014 Left ventricle: E/e&'>15.7 suggestive of elevated LV filling pressures. The cavity size was normal. Systolic function was moderately reduced. The estimated ejection fraction was in the range of 35% to 40%. Diffuse hypokinesis. There is severe hypokinesis of the entireinferior and inferoseptal myocardium. There was an increased relative contribution of atrial contraction to ventricular filling. Doppler parameters are consistent with abnormal left ventricular relaxation (grade 1 diastolic dysfunction). - Aortic valve: Moderate diffuse thickening and calcification. Valve mobility was restricted. There was at least mild stenosis but the AV velocity and mean gradient may be underestimated due to reduced LVF. There was mild regurgitation. Mean gradient (S): 11 mm Hg. Peak gradient (S): 19 mm Hg. - Mitral valve: Moderately calcified annulus. Moderate diffuse thickening and calcification of the anterior leaflet and posterior leaflet, with moderate involvement of chords. There was mild regurgitation. - Tricuspid valve: There was mild regurgitation. - Pulmonary arteries: PA peak pressure: 31 mm Hg (S).   Assessment & Plan  A very pleasant 79 year  old female  1. Non-ischemic cardiomyopathy - LVEF 30-35% on the  Echo and 37% on cardiac MRI. The patient continues to be NYHA III after 5 months of optimal therapy. - we will continue lisinopril 5 mg po daily - we will increase carvedilol to 12.5 mg PO BID  as her resting HR is high, we will have to d/c imdur to allow room in BP, we are unable to add spironolactone at this time because of her BP - her LVEF is borderline for ICD placement, however with continuous symptoms of NYHA III and wide QRS (LBBB) we will refer for a consideration of an BiV-ICD implantation.  - she is euvolemic, no lasix necessary  2. Acute on chronic systolic CHF - appears euvolemic, management as above  3. Palpitations - associated with nausea and shortness of breath, 2200 unifocal PVCs on Holter monitor, no runs - we will increase carvedilol to 12.5 mg PO BID  4. Lipids - LDL 130, added  red yeast rice.  5. Chest pain - risk factors include significant FH of premature CAD. Cardiac cath showed normal coronaries, h/o consistent with spasms, we started amlodipine 2.5 mg PO daily, worsening of symptoms, then switched to Imdur 30 mg po daily without significant improvement. Continue aspirin 81 mg po daily and sl NTG PRN.  Part of her problem might be significant deconditioning. We will refer patient for cardiac rehabilitation.   Follow up in 3 month.  Lars Masson, MD, [facility] 03/31/2014, 1:59 PM

## 2014-03-31 NOTE — Patient Instructions (Signed)
Your physician has recommended you make the following change in your medication:   STOP TAKING IMDUR NOW  START TAKING COREG (CARVEDILOL) 12.5 MG ONCE DAILY     You have been referred to CARDIAC REHAB    You have been referred to DR Graciela Husbands OR DR Ladona Ridgel IN EP FOR CONSIDERATION OF BI-V ICD    Your physician recommends that you schedule a follow-up appointment in: 3 MONTHS WITH DR Delton See

## 2014-04-06 ENCOUNTER — Other Ambulatory Visit: Payer: Self-pay | Admitting: *Deleted

## 2014-04-06 MED ORDER — CARVEDILOL 12.5 MG PO TABS: 12.5000 mg | ORAL_TABLET | Freq: Two times a day (BID) | ORAL | Status: DC

## 2014-04-06 NOTE — Progress Notes (Signed)
Correction made per Dr Lindaann Slough dictation note from 03/31/14 that pt is to take Carvedilol 12.5 mg po bid.  Sent this to [facility] for a 90 day supply.

## 2014-04-11 ENCOUNTER — Ambulatory Visit (INDEPENDENT_AMBULATORY_CARE_PROVIDER_SITE_OTHER): Payer: Medicare Other | Admitting: Internal Medicine

## 2014-04-11 ENCOUNTER — Encounter: Payer: Self-pay | Admitting: *Deleted

## 2014-04-11 ENCOUNTER — Encounter: Payer: Self-pay | Admitting: Internal Medicine

## 2014-04-11 VITALS — BP 116/60 | HR 77 | Ht 60.0 in | Wt 164.6 lb

## 2014-04-11 DIAGNOSIS — I5022 Chronic systolic (congestive) heart failure: Secondary | ICD-10-CM | POA: Diagnosis not present

## 2014-04-11 DIAGNOSIS — Z79899 Other long term (current) drug therapy: Secondary | ICD-10-CM

## 2014-04-11 DIAGNOSIS — R42 Dizziness and giddiness: Secondary | ICD-10-CM

## 2014-04-11 DIAGNOSIS — I429 Cardiomyopathy, unspecified: Secondary | ICD-10-CM | POA: Diagnosis not present

## 2014-04-11 DIAGNOSIS — I4729 Other ventricular tachycardia: Secondary | ICD-10-CM

## 2014-04-11 DIAGNOSIS — I447 Left bundle-branch block, unspecified: Secondary | ICD-10-CM | POA: Diagnosis not present

## 2014-04-11 DIAGNOSIS — I428 Other cardiomyopathies: Secondary | ICD-10-CM

## 2014-04-11 DIAGNOSIS — I472 Ventricular tachycardia: Secondary | ICD-10-CM

## 2014-04-11 MED ORDER — SPIRONOLACTONE 25 MG PO TABS: 12.5000 mg | ORAL_TABLET | Freq: Every day | ORAL | Status: DC

## 2014-04-11 NOTE — Progress Notes (Signed)
ELECTROPHYSIOLOGY CONSULT NOTE  Patient ID: [name] [name], MRN: [id number], DOB/AGE: [birth year] 79 y.o. Admit date: (Not on file) Date of Consult: 04/11/2014  Primary Physician: [name], MD Primary Cardiologist: *KN Chief Complaint:    HPI [name] [name] is a 79 y.o. female referred for consideration of CRT-D. She has a history of a nonischemic cardiomyopathy identified October 2015. She had presented at that time with chest pain and shortness of breath. She's also had some nocturnal dyspnea and orthopnea. She underwent catheterization that revealed no obstruction. She was noted to have left bundle branch block.  She's been treated with guidelines directed therapy as her blood pressure allows including beta blockers and Ace inhibitors. She has not been on diuretics. She also takes NSAIDs.  Repeat evaluation recently included a Cardiac MRI 3/16 demonstrating no significant scar ejection fraction 37%.  She's had 2 episodes where she has had presyncope. One occurred while walking the other occurred while standing. Duration was about 5 seconds. There were associated tachypalpitations. She also has a sensation of floaters. She underwent Holter monitoring demonstrated rate related left bundle branch block some nonsustained ventricular tachycardia as well as PVCs.     Past Medical History  Diagnosis Date  . Left bundle branch block   . Asthma   . Thyroid disease   . Arthritis   . Pancreatitis, acute   . Angina effort       Surgical History:  Past Surgical History  Procedure Laterality Date  . Hernia repair    . Abdominal hysterectomy    . Cholecystectomy    . Left heart catheterization with coronary angiogram N/A 11/02/2013    Procedure: LEFT HEART CATHETERIZATION WITH CORONARY ANGIOGRAM;  Surgeon: Corky Crafts, MD;  Location: [facility] CATH LAB;  Service: Cardiovascular;  Laterality: N/A;     Home Meds: Prior to Admission medications   Medication Sig Start  Date End Date Taking? Authorizing Provider  amoxicillin (AMOXIL) 500 MG capsule Take 2,000 mg by mouth once. Before dental appointments 09/29/13   Historical Provider, MD  aspirin EC 81 MG tablet Take 1 tablet (81 mg total) by mouth daily. 10/29/13   Lars Masson, MD  aspirin-acetaminophen-caffeine (EXCEDRIN MIGRAINE) [phone] MG per tablet Take 1 tablet by mouth every 6 (six) hours as needed for headache or migraine.    Historical Provider, MD  beta carotene w/minerals (OCUVITE) tablet Take 1 tablet by mouth daily.    Historical Provider, MD  calcium carbonate (TUMS - DOSED IN MG ELEMENTAL CALCIUM) 500 MG chewable tablet Chew 1-4 tablets by mouth 3 (three) times daily as needed for indigestion or heartburn.    Historical Provider, MD  carvedilol (COREG) 12.5 MG tablet Take 1 tablet (12.5 mg total) by mouth 2 (two) times daily with a meal. 04/06/14   Lars Masson, MD  lisinopril (PRINIVIL,ZESTRIL) 5 MG tablet Take 1 tablet (5 mg total) by mouth daily. 03/11/14   Lars Masson, MD  magnesium oxide (MAG-OX) 400 MG tablet Take 1 tablet (400 mg total) by mouth daily. 03/18/14   Lars Masson, MD  naproxen (NAPROSYN) 500 MG tablet Take 500 mg by mouth 2 (two) times daily. 09/08/13   Historical Provider, MD  nitroGLYCERIN (NITROSTAT) 0.4 MG SL tablet Place 1 tablet (0.4 mg total) under the tongue every 5 (five) minutes as needed for chest pain. 10/29/13   Lars Masson, MD  ondansetron (ZOFRAN) 4 MG tablet Take 2 mg by mouth every 8 (eight) hours as needed  for nausea or vomiting.     Historical Provider, MD  PROAIR HFA 108 (90 BASE) MCG/ACT inhaler Inhale 1 puff into the lungs every 4 (four) hours as needed for wheezing or shortness of breath.  09/08/13   Historical Provider, MD  SYNTHROID 100 MCG tablet Take 100 mcg by mouth daily. 08/27/13   Historical Provider, MD  Vitamin D, Ergocalciferol, (DRISDOL) 50000 UNITS CAPS capsule Take 50,000 Units by mouth every 14 (fourteen) days.    Historical  Provider, MD    Allergies:  Allergies  Allergen Reactions  . Sulfa Antibiotics Hives    History   Social History  . Marital Status: Married    Spouse Name: N/A  . Number of Children: N/A  . Years of Education: N/A   Occupational History  . Not on file.   Social History Main Topics  . Smoking status: Never Smoker   . Smokeless tobacco: Not on file  . Alcohol Use: Yes     Comment: occassionally  . Drug Use: No  . Sexual Activity: Not on file   Other Topics Concern  . Not on file   Social History Narrative     Family History  Problem Relation Age of Onset  . Hyperlipidemia Mother   . Arrhythmia Mother     palpitations  . Heart disease Father      ROS:  Please see the history of present illness.   X nausea muscle pain and headaches  All other systems reviewed and negative.    Physical Exam:   Blood pressure 116/60, pulse 77, height 5' (1.524 m), weight 164 lb 9.6 oz (74.662 kg). General: Well developed, well nourished female in no acute distress. Head: Normocephalic, atraumatic, sclera non-icteric, no xanthomas, nares are without discharge. EENT: normal Lymph Nodes:  none Back: with kyphosis, no CVA tendersness Neck: Negative for carotid bruits. JVD not elevated. Lungs: Clear bilaterally to auscultation without wheezes, rales, or rhonchi. Breathing is unlabored. Heart: RRR with S1 S2. No  murmur , rubs, or gallops appreciated. Abdomen: Soft, non-tender, non-distended with normoactive bowel sounds. No hepatomegaly. No rebound/guarding. No obvious abdominal masses. Msk:  Strength and tone appear normal for age. Extremities: No clubbing or cyanosis. No* edema.  Distal pedal pulses are 2+ and equal bilaterally. Skin: Warm and Dry Neuro: Alert and oriented X 3. CN III-XII intact Grossly normal sensory and motor function . Psych:  Responds to questions appropriately with a normal affect.      Labs: Cardiac Enzymes No results for input(s): CKTOTAL, CKMB,  TROPONINI in the last 72 hours. CBC Lab Results  Component Value Date   WBC 6.9 10/29/2013   HGB 13.3 10/29/2013   HCT 39.1 10/29/2013   MCV 91.3 10/29/2013   PLT 207.0 10/29/2013   PROTIME: No results for input(s): LABPROT, INR in the last 72 hours. Chemistry No results for input(s): NA, K, CL, CO2, BUN, CREATININE, CALCIUM, PROT, BILITOT, ALKPHOS, ALT, AST, GLUCOSE in the last 168 hours.  Invalid input(s): LABALBU Lipids Lab Results  Component Value Date   CHOL 197 12/10/2013   HDL 53.50 12/10/2013   LDLCALC 130* 12/10/2013   TRIG 70.0 12/10/2013   BNP No results found for: PROBNP Thyroid Function Tests: No results for input(s): TSH, T4TOTAL, T3FREE, THYROIDAB in the last 72 hours.  Invalid input(s): FREET3    Miscellaneous No results found for: DDIMER  Radiology/Studies:  Mr Card Morphology Wo/w Cm  03/17/2014   CLINICAL DATA:  79 year old female with non-ischemic cardiomyopathy. Evaluate LVEF.  EXAM: CARDIAC MRI  TECHNIQUE: The patient was scanned on a 1.5 Tesla GE magnet. A dedicated cardiac coil was used. Functional imaging was done using Fiesta sequences. 2,3, and 4 chamber views were done to assess for RWMA's. Modified Simpson's rule using a short axis stack was used to calculate an ejection fraction on a dedicated work [employer]. The patient received 25 cc of Multihance. After 10 minutes inversion recovery sequences were used to assess for infiltration and scar tissue.  CONTRAST:  25 cc  of Multihance  FINDINGS: 1. Mildly dilated left ventricle with normal wall thickness and moderately decreased systolic function (LVEF = 37%).  There is mild diffuse hypokinesis with anterior wall akinesis and dyskinetic septum.  The measurements are as follow:  LVEDD:  60 mm  LVESD:  49 mm  LVEDV:  209 ml  LVESV:  132 ml  SV: 77ml  CO:  6.0 L/minute  Myocardial mass:  137 g  2. Normal right ventricular size, thickness and systolic function (RVEF = 56%). There are no  regional wall motion abnormalities.  RVEDV:  126 ml  RVESV:  55 ml  SV:  71 ml  CO:  5.6 L/minute  3.  Mild left atrial dilatation.  4. Mild to moderate mitral regurgitation. Trivial tricuspid regurgitation.  5. Aortic valve is tricuspid and moderately thickened with moderately limited leaflet opening. Mild aortic stenosis and mild aortic insufficiency.  6. Late gadolinium enhancement at the right ventricular insertion points consistent with volume overload.  7. Normal caliber of the aortic root, ascending aorta and main pulmonary artery.  IMPRESSION: 1. Mildly dilated left ventricle with normal wall thickness and moderately decreased systolic function (LVEF = 37%).  There is mild diffuse hypokinesis with anterior wall akinesis and dyskinetic septum.  Late gadolinium enhancement at the right ventricular insertion points consistent with volume overload.  2. Normal right ventricular size, thickness and systolic function (RVEF = 56%). There are no regional wall motion abnormalities.  3.  Mild left atrial dilatation.  4. Mild to moderate mitral regurgitation. Trivial tricuspid regurgitation.  5. Aortic valve is tricuspid and moderately thickened with moderately limited leaflet opening. Mild aortic stenosis and mild aortic insufficiency.  Tobias Alexander   Electronically Signed   By: Tobias Alexander   On: 03/17/2014 16:40    EKG: Was ordered today and demonstrated sinus rhythm at 77 Intervals 16/17/45 Axis (-56  Holter monitor was reviewed. It demonstrates some degree of ventricular ectopy but also rate related left bundle branch block and it's difficult to quantitate the percentage of time with a narrow QRS   Assessment and Plan:  Nonischemic cardiomyopathy  Congestive heart failure-chronic-systolic  Left Bundle-branch block-rate related  Ventricular tachycardia-nonsustained  Presyncope  Tachypalpitations   The patient's ejection fraction of 37 % is disqualifying for CRT . I would recommend  reassessment of her left ventricular ejection fraction in number of months and hopefully we will be able to improve it further by the addition of Aldactone. It is addition may be limited by blood pressure as has been Dr. Kirtland Bouchard Nelson's thought in the past but we will try it.  I reviewed with her and her husband the need for close surveillance of this medication is potential risks and benefits.    I'm also concerned about the presyncopal episodes in the context of tachycardia palpitations and documented nonsustained ventricular tachycardia. We discussed the role of a link recorder to identify the mechanism as ventricular tachycardia in this context is possible and potentially  malignant       Sherryl Manges

## 2014-04-11 NOTE — Patient Instructions (Addendum)
Your physician has recommended you make the following change in your medication:  1) START Aldactone 12.5 mg daily  Your physician recommends that you return for lab work in: 2 weeks for a BMET  Your physician has requested that you have an echocardiogram in 2 months (on a day Dr. Delton See is reading) . Echocardiography is a painless test that uses sound waves to create images of your heart. It provides your doctor with information about the size and shape of your heart and how well your heart's chambers and valves are working. This procedure takes approximately one hour. There are no restrictions for this procedure.  Your physician has recommended that you have a  LINQ monitor (loop recorder) inserted.  Please see the instruction sheet given to you today for more information.  Your wound check is scheduled for 04/27/14 at 9:00 a.m. at [address]

## 2014-04-13 ENCOUNTER — Encounter (HOSPITAL_COMMUNITY): Payer: Self-pay | Admitting: Internal Medicine

## 2014-04-13 ENCOUNTER — Encounter (HOSPITAL_COMMUNITY)
Admission: RE | Disposition: A | Discharge status: Home / Self Care | Payer: Self-pay | Source: Ambulatory Visit | Attending: Internal Medicine

## 2014-04-13 ENCOUNTER — Ambulatory Visit (HOSPITAL_COMMUNITY)
Admission: RE | Admit: 2014-04-13 | Discharge: 2014-04-13 | Disposition: A | Discharge status: Home / Self Care | Payer: Medicare Other | Source: Ambulatory Visit | Attending: Internal Medicine | Admitting: Internal Medicine

## 2014-04-13 DIAGNOSIS — R55 Syncope and collapse: Secondary | ICD-10-CM | POA: Insufficient documentation

## 2014-04-13 DIAGNOSIS — I4729 Other ventricular tachycardia: Secondary | ICD-10-CM

## 2014-04-13 DIAGNOSIS — R42 Dizziness and giddiness: Secondary | ICD-10-CM

## 2014-04-13 DIAGNOSIS — I472 Ventricular tachycardia: Secondary | ICD-10-CM | POA: Insufficient documentation

## 2014-04-13 HISTORY — PX: LOOP RECORDER IMPLANT: SHX5477

## 2014-04-13 SURGERY — LOOP RECORDER IMPLANT
Anesthesia: LOCAL

## 2014-04-13 NOTE — H&P (Signed)
No changes since seen as outpt

## 2014-04-13 NOTE — CV Procedure (Signed)
Pre op Dx presyncope NICM VT nS Post op Dx same  Procedure  Loop Recorder implantation  After routine prep and drape of the left parasternal area, a small incision was created. A Medtronic LINQ Reveal Loop Recorder  Serial Number  [id number] was inserted.    SteriStrip dressing was  applied.  The patient tolerated the procedure without apparent complication.

## 2014-04-27 ENCOUNTER — Ambulatory Visit (INDEPENDENT_AMBULATORY_CARE_PROVIDER_SITE_OTHER): Payer: Medicare Other | Admitting: *Deleted

## 2014-04-27 ENCOUNTER — Other Ambulatory Visit (INDEPENDENT_AMBULATORY_CARE_PROVIDER_SITE_OTHER): Payer: Medicare Other | Admitting: *Deleted

## 2014-04-27 DIAGNOSIS — Z79899 Other long term (current) drug therapy: Secondary | ICD-10-CM | POA: Diagnosis not present

## 2014-04-27 DIAGNOSIS — R42 Dizziness and giddiness: Secondary | ICD-10-CM

## 2014-04-27 LAB — MDC_IDC_ENUM_SESS_TYPE_INCLINIC
Date Time Interrogation Session: 20160420091853
MDC IDC SET ZONE DETECTION INTERVAL: 2000 ms
Zone Setting Detection Interval: 3000 ms
Zone Setting Detection Interval: 390 ms

## 2014-04-27 LAB — BASIC METABOLIC PANEL
BUN: 22 mg/dL (ref 6–23)
CHLORIDE: 104 meq/L (ref 96–112)
CO2: 27 mEq/L (ref 19–32)
Calcium: 10.4 mg/dL (ref 8.4–10.5)
Creatinine, Ser: 1.16 mg/dL (ref 0.40–1.20)
GFR: 48.23 mL/min — ABNORMAL LOW (ref >60.00)
GLUCOSE: 95 mg/dL (ref 70–99)
POTASSIUM: 4 meq/L (ref 3.5–5.1)
SODIUM: 139 meq/L (ref 135–145)

## 2014-04-27 NOTE — Addendum Note (Signed)
Addended by: Tonita Phoenix on: 04/27/2014 07:36 AM   Modules accepted: Orders

## 2014-04-27 NOTE — Progress Notes (Signed)
ILR wound check appointment. Steri-strips removed. Wound without redness or edema. Incision edges approximated, wound well healed. Pt with 0 tachy episodes; 0 brady episodes; 0 asystole. 10 symptomatic recordings---all EGMs show PVCs. [facility]link summary reports QMO & ROV w/ Dr. Graciela Husbands in 62mo.

## 2014-05-05 ENCOUNTER — Encounter: Payer: Self-pay | Admitting: Internal Medicine

## 2014-05-05 NOTE — Telephone Encounter (Signed)
This encounter was created in error - please disregard.

## 2014-05-05 NOTE — Telephone Encounter (Signed)
New Prob ° ° ° °Pt returning call from earlier. Please call. °

## 2014-05-06 ENCOUNTER — Encounter: Payer: Self-pay | Admitting: Internal Medicine

## 2014-05-09 ENCOUNTER — Encounter: Payer: Self-pay | Admitting: Internal Medicine

## 2014-05-13 ENCOUNTER — Encounter: Payer: Self-pay | Admitting: Internal Medicine

## 2014-05-13 ENCOUNTER — Ambulatory Visit (INDEPENDENT_AMBULATORY_CARE_PROVIDER_SITE_OTHER): Payer: Medicare Other | Admitting: *Deleted

## 2014-05-13 DIAGNOSIS — R42 Dizziness and giddiness: Secondary | ICD-10-CM | POA: Diagnosis not present

## 2014-05-14 LAB — CUP PACEART REMOTE DEVICE CHECK
Date Time Interrogation Session: 20160513020809
MDC IDC SET ZONE DETECTION INTERVAL: 3000 ms
MDC IDC SET ZONE DETECTION INTERVAL: 390 ms
Zone Setting Detection Interval: 2000 ms

## 2014-05-19 ENCOUNTER — Encounter: Payer: Self-pay | Admitting: Internal Medicine

## 2014-05-20 NOTE — Progress Notes (Signed)
Loop recorder 

## 2014-05-23 ENCOUNTER — Encounter: Payer: Self-pay | Admitting: Internal Medicine

## 2014-05-30 ENCOUNTER — Encounter: Payer: Self-pay | Admitting: Internal Medicine

## 2014-06-01 ENCOUNTER — Encounter: Payer: Self-pay | Admitting: Internal Medicine

## 2014-06-02 ENCOUNTER — Encounter: Payer: Self-pay | Admitting: Internal Medicine

## 2014-06-02 ENCOUNTER — Ambulatory Visit (INDEPENDENT_AMBULATORY_CARE_PROVIDER_SITE_OTHER): Payer: Medicare Other | Admitting: Internal Medicine

## 2014-06-02 VITALS — BP 120/66 | HR 86 | Ht 59.0 in | Wt 162.0 lb

## 2014-06-02 DIAGNOSIS — R002 Palpitations: Secondary | ICD-10-CM | POA: Diagnosis not present

## 2014-06-02 DIAGNOSIS — Z4509 Encounter for adjustment and management of other cardiac device: Secondary | ICD-10-CM

## 2014-06-02 LAB — CUP PACEART INCLINIC DEVICE CHECK
Date Time Interrogation Session: 20160526163639
Zone Setting Detection Interval: 2000 ms
Zone Setting Detection Interval: 3000 ms
Zone Setting Detection Interval: 390 ms

## 2014-06-02 NOTE — Patient Instructions (Addendum)
Medication Instructions:  Your physician recommends that you continue on your current medications as directed. Please refer to the Current Medication list given to you today.  Labwork: None  Testing/Procedures: None  Follow-Up: Your physician wants you to follow-up in: June 24th @ 8:15 am with Dr. Graciela Husbands.  We will call you to reschedule follow up with Dr. Delton See for August.  Any Other Special Instructions Will Be Listed Below (If Applicable). None

## 2014-06-02 NOTE — Progress Notes (Signed)
Patient Care Team: [name], MD as PCP - General (Family Medicine)   HPI  [name] [name] is a 79 y.o. female Seen in follow-up for nonischemic cardiomyopathy left bundle branch block and nonsustained ventricular tachycardia. Her ejection fraction most recently was 37% disqualifying her for CRT. She did however have symptoms of presyncope associated with palpitations and we undertook an event recorder.  She is brought in today because of tachycardia and symptomatic palps with the recorder  Past Medical History  Diagnosis Date  . Left bundle branch block   . Asthma   . Thyroid disease   . Arthritis   . Pancreatitis, acute   . Angina effort   . Congestive dilated cardiomyopathy 03/31/2014  . LBBB (left bundle branch block) 10/30/2013    Past Surgical History  Procedure Laterality Date  . Hernia repair    . Abdominal hysterectomy    . Cholecystectomy    . Left heart catheterization with coronary angiogram N/A 11/02/2013    Procedure: LEFT HEART CATHETERIZATION WITH CORONARY ANGIOGRAM;  Surgeon: Corky Crafts, MD;  Location: [facility] CATH LAB;  Service: Cardiovascular;  Laterality: N/A;  . Loop recorder implant N/A 04/13/2014    Procedure: LOOP RECORDER IMPLANT;  Surgeon: Duke Salvia, MD;  Location: [facility] CATH LAB;  Service: Cardiovascular;  Laterality: N/A;    Current Outpatient Prescriptions  Medication Sig Dispense Refill  . amoxicillin (AMOXIL) 500 MG capsule Take 2,000 mg by mouth once. Before dental appointments    . aspirin EC 81 MG tablet Take 1 tablet (81 mg total) by mouth daily. 90 tablet 3  . aspirin-acetaminophen-caffeine (EXCEDRIN MIGRAINE) 250-250-65 MG per tablet Take 1 tablet by mouth every 6 (six) hours as needed for headache or migraine.    . beta carotene w/minerals (OCUVITE) tablet Take 1 tablet by mouth daily.    . calcium carbonate (TUMS - DOSED IN MG ELEMENTAL CALCIUM) 500 MG chewable tablet Chew 1-4 tablets by mouth 3 (three) times daily  as needed for indigestion or heartburn.    . carvedilol (COREG) 6.25 MG tablet Take 6.25 mg by mouth 2 (two) times daily.    [name] lisinopril (PRINIVIL,ZESTRIL) 5 MG tablet Take 1 tablet (5 mg total) by mouth daily. 60 tablet 1  . magnesium oxide (MAG-OX) 400 MG tablet Take 1 tablet (400 mg total) by mouth daily.    . naproxen (NAPROSYN) 500 MG tablet Take 500 mg by mouth 2 (two) times daily.    . nitroGLYCERIN (NITROSTAT) 0.4 MG SL tablet Place 1 tablet (0.4 mg total) under the tongue every 5 (five) minutes as needed for chest pain. 90 tablet 3  . ondansetron (ZOFRAN) 4 MG tablet Take 2 mg by mouth every 8 (eight) hours as needed for nausea or vomiting.     [name] PROAIR HFA 108 (90 BASE) MCG/ACT inhaler Inhale 1 puff into the lungs every 4 (four) hours as needed for wheezing or shortness of breath.     . spironolactone (ALDACTONE) 25 MG tablet Take 0.5 tablets (12.5 mg total) by mouth daily. 15 tablet 3  . SYNTHROID 100 MCG tablet Take 100 mcg by mouth daily.    . Vitamin D, Ergocalciferol, (DRISDOL) 50000 UNITS CAPS capsule Take 50,000 Units by mouth every 14 (fourteen) days.     No current facility-administered medications for this visit.    Allergies  Allergen Reactions  . Sulfa Antibiotics Hives    Review of Systems negative except from HPI and PMH  Physical  Exam BP 120/66 mmHg  Pulse 86  Ht  (1.499 m)  Wt 162 lb (73.483 kg)  BMI 32.70 kg/m2 Well developed and well nourished in no acute distress HENT normal E scleral and icterus clear Neck Supple JVP flat; carotids brisk and full Clear to ausculation  Regular rate and rhythm, no murmurs gallops or rub Soft with active bowel sounds No clubbing cyanosis  Edema Alert and oriented, grossly normal motor and sensory function Skin Warm and Dry     Assessment and  Plan  Nonischemic cardiomyopathy  Tachycardia  Symptomatic PVCs  We spent a long time reviewing the tracings demonstrating symptomatic PVCS assoc w presyncope,  NO VT, but rapid tachycardia   i recalled but we could not find a tracing of fast and irregular tachycardia, which i thought was Afib  We have elected to defer therapy for a few more weeks, to see if we can identify afib prior to the initiation of amiodarone which i anticipate we will use for symptomatic OPVCs and tachycardia  Family agreeable

## 2014-06-07 LAB — CUP PACEART REMOTE DEVICE CHECK
MDC IDC SESS DTM: 20160513020809
MDC IDC SET ZONE DETECTION INTERVAL: 3000 ms
Zone Setting Detection Interval: 2000 ms
Zone Setting Detection Interval: 390 ms

## 2014-06-08 ENCOUNTER — Other Ambulatory Visit: Payer: Self-pay

## 2014-06-08 ENCOUNTER — Ambulatory Visit (HOSPITAL_COMMUNITY): Payer: Medicare Other | Attending: Cardiovascular Disease

## 2014-06-08 ENCOUNTER — Other Ambulatory Visit: Payer: Self-pay | Admitting: Internal Medicine

## 2014-06-08 ENCOUNTER — Encounter: Payer: Self-pay | Admitting: Internal Medicine

## 2014-06-08 DIAGNOSIS — I5022 Chronic systolic (congestive) heart failure: Secondary | ICD-10-CM

## 2014-06-08 DIAGNOSIS — I351 Nonrheumatic aortic (valve) insufficiency: Secondary | ICD-10-CM | POA: Insufficient documentation

## 2014-06-08 DIAGNOSIS — I071 Rheumatic tricuspid insufficiency: Secondary | ICD-10-CM | POA: Diagnosis not present

## 2014-06-08 DIAGNOSIS — I350 Nonrheumatic aortic (valve) stenosis: Secondary | ICD-10-CM | POA: Insufficient documentation

## 2014-06-08 DIAGNOSIS — I517 Cardiomegaly: Secondary | ICD-10-CM | POA: Diagnosis not present

## 2014-06-08 DIAGNOSIS — I447 Left bundle-branch block, unspecified: Secondary | ICD-10-CM | POA: Diagnosis not present

## 2014-06-08 DIAGNOSIS — I428 Other cardiomyopathies: Secondary | ICD-10-CM

## 2014-06-08 DIAGNOSIS — I429 Cardiomyopathy, unspecified: Secondary | ICD-10-CM | POA: Diagnosis present

## 2014-06-10 ENCOUNTER — Encounter: Payer: Self-pay | Admitting: Internal Medicine

## 2014-06-13 ENCOUNTER — Encounter: Payer: Self-pay | Admitting: Internal Medicine

## 2014-06-13 ENCOUNTER — Ambulatory Visit (INDEPENDENT_AMBULATORY_CARE_PROVIDER_SITE_OTHER): Payer: Medicare Other | Admitting: *Deleted

## 2014-06-13 ENCOUNTER — Ambulatory Visit: Payer: Medicare Other | Admitting: Cardiology

## 2014-06-13 DIAGNOSIS — R42 Dizziness and giddiness: Secondary | ICD-10-CM | POA: Diagnosis not present

## 2014-06-15 ENCOUNTER — Encounter: Payer: Self-pay | Admitting: Internal Medicine

## 2014-06-15 NOTE — Progress Notes (Signed)
Loop recorder 

## 2014-06-21 LAB — CUP PACEART REMOTE DEVICE CHECK: Date Time Interrogation Session: 20160608040500

## 2014-06-27 ENCOUNTER — Encounter: Payer: Self-pay | Admitting: Internal Medicine

## 2014-07-01 ENCOUNTER — Encounter: Payer: Self-pay | Admitting: Internal Medicine

## 2014-07-01 ENCOUNTER — Encounter: Payer: Self-pay | Admitting: *Deleted

## 2014-07-01 ENCOUNTER — Ambulatory Visit (INDEPENDENT_AMBULATORY_CARE_PROVIDER_SITE_OTHER): Payer: Medicare Other | Admitting: Internal Medicine

## 2014-07-01 VITALS — BP 134/68 | HR 77 | Ht 59.0 in | Wt 161.0 lb

## 2014-07-01 DIAGNOSIS — Z01812 Encounter for preprocedural laboratory examination: Secondary | ICD-10-CM | POA: Diagnosis not present

## 2014-07-01 DIAGNOSIS — Z4509 Encounter for adjustment and management of other cardiac device: Secondary | ICD-10-CM | POA: Diagnosis not present

## 2014-07-01 DIAGNOSIS — I428 Other cardiomyopathies: Secondary | ICD-10-CM

## 2014-07-01 DIAGNOSIS — I493 Ventricular premature depolarization: Secondary | ICD-10-CM | POA: Diagnosis not present

## 2014-07-01 DIAGNOSIS — I429 Cardiomyopathy, unspecified: Secondary | ICD-10-CM

## 2014-07-01 DIAGNOSIS — I5022 Chronic systolic (congestive) heart failure: Secondary | ICD-10-CM

## 2014-07-01 LAB — CBC WITH DIFFERENTIAL/PLATELET
BASOS ABS: 0.1 10*3/uL (ref 0.0–0.1)
BASOS PCT: 0.7 % (ref 0.0–3.0)
Eosinophils Absolute: 1 10*3/uL — ABNORMAL HIGH (ref 0.0–0.7)
Eosinophils Relative: 11.3 % — ABNORMAL HIGH (ref 0.0–5.0)
HCT: 39.5 % (ref 36.0–46.0)
Hemoglobin: 13.5 g/dL (ref 12.0–15.0)
LYMPHS PCT: 15.1 % (ref 12.0–46.0)
Lymphs Abs: 1.3 10*3/uL (ref 0.7–4.0)
MCHC: 34 g/dL (ref 30.0–36.0)
MCV: 90.3 fl (ref 78.0–100.0)
Monocytes Absolute: 0.5 10*3/uL (ref 0.1–1.0)
Monocytes Relative: 5.8 % (ref 3.0–12.0)
NEUTROS PCT: 67.1 % (ref 43.0–77.0)
Neutro Abs: 5.8 10*3/uL (ref 1.4–7.7)
Platelets: 214 10*3/uL (ref 150.0–400.0)
RBC: 4.38 Mil/uL (ref 3.87–5.11)
RDW: 14.2 % (ref 11.5–15.5)
WBC: 8.7 10*3/uL (ref 4.0–10.5)

## 2014-07-01 LAB — CUP PACEART INCLINIC DEVICE CHECK
MDC IDC SESS DTM: 20160624144537
MDC IDC SET ZONE DETECTION INTERVAL: 390 ms
Zone Setting Detection Interval: 2000 ms
Zone Setting Detection Interval: 3000 ms

## 2014-07-01 LAB — BASIC METABOLIC PANEL
BUN: 12 mg/dL (ref 6–23)
CHLORIDE: 103 meq/L (ref 96–112)
CO2: 29 mEq/L (ref 19–32)
Calcium: 10.6 mg/dL — ABNORMAL HIGH (ref 8.4–10.5)
Creatinine, Ser: 1.05 mg/dL (ref 0.40–1.20)
GFR: 54.08 mL/min — AB (ref >60.00)
Glucose, Bld: 83 mg/dL (ref 70–99)
POTASSIUM: 4.2 meq/L (ref 3.5–5.1)
SODIUM: 139 meq/L (ref 135–145)

## 2014-07-01 MED ORDER — LISINOPRIL 5 MG PO TABS: 5.0000 mg | ORAL_TABLET | Freq: Every day | ORAL | Status: DC

## 2014-07-01 NOTE — Progress Notes (Signed)
[place] Informed Consent    Subject Name: [name] [name]   Subject met inclusion and exclusion criteria. The informed consent form, study requirements and expectations were reviewed with the subject and questions and concerns were addressed prior to the signing of the consent form. The subject verbalized understanding of the trail requirements. The subject agreed to participate in the QP EXCELS trial and signed the informed consent. The informed consent was obtained prior to performance of any protocol-specific procedures for the subject. A copy of the signed informed consent was given to the subject and a copy was placed in the subject's medical record.    Raquel Sarna Leslea Vowles  07/01/2014

## 2014-07-01 NOTE — Progress Notes (Signed)
    Patient Care Team: Wilson Oliver Elkins, MD as PCP - General (Family Medicine)   HPI  [name] [name] is a 79 y.o. female Seen in follow-up for nonischemic cardiomyopathy left bundle branch block and nonsustained ventricular tachycardia. Her ejection fraction most recently was 37% disqualifying her for CRT. She did however have symptoms of presyncope associated with palpitations and we undertook an event recorder.  She continues with symptoms of shortness of breath. Repeat assessment of LV function demonstrates an EF of 25-30%.  She also has spells of "graying out". These are all positional and resolved by sitting. They're associated with palpitations that she feels her neck. An event recorder demonstrated an episode of a wide QRS tachycardia with a morphology similar to sinus;  it lasted relatively briefly i.e. about 6 or 8 seconds Past Medical History  Diagnosis Date  . Left bundle branch block   . Asthma   . Thyroid disease   . Arthritis   . Pancreatitis, acute   . Angina effort   . Congestive dilated cardiomyopathy 03/31/2014  . LBBB (left bundle branch block) 10/30/2013    Past Surgical History  Procedure Laterality Date  . Hernia repair    . Abdominal hysterectomy    . Cholecystectomy    . Left heart catheterization with coronary angiogram N/A 11/02/2013    Procedure: LEFT HEART CATHETERIZATION WITH CORONARY ANGIOGRAM;  Surgeon: Jayadeep S Varanasi, MD;  Location: [facility] CATH LAB;  Service: Cardiovascular;  Laterality: N/A;  . Loop recorder implant N/A 04/13/2014    Procedure: LOOP RECORDER IMPLANT;  Surgeon: Steven C Klein, MD;  Location: [facility] CATH LAB;  Service: Cardiovascular;  Laterality: N/A;    Current Outpatient Prescriptions  Medication Sig Dispense Refill  . amoxicillin (AMOXIL) 500 MG capsule Take 2,000 mg by mouth once. Before dental appointments    . aspirin EC 81 MG tablet Take 1 tablet (81 mg total) by mouth daily. 90 tablet 3  .  aspirin-acetaminophen-caffeine (EXCEDRIN MIGRAINE) 250-250-65 MG per tablet Take 1 tablet by mouth every 6 (six) hours as needed for headache or migraine.    . beta carotene w/minerals (OCUVITE) tablet Take 1 tablet by mouth daily.    . betamethasone dipropionate 0.05 % lotion Apply 1 application topically as needed (for toes).    . calcium carbonate (TUMS - DOSED IN MG ELEMENTAL CALCIUM) 500 MG chewable tablet Chew 1-4 tablets by mouth 3 (three) times daily as needed for indigestion or heartburn.    . carvedilol (COREG) 6.25 MG tablet Take 6.25 mg by mouth 2 (two) times daily.    . lisinopril (PRINIVIL,ZESTRIL) 5 MG tablet Take 1 tablet (5 mg total) by mouth daily. 60 tablet 1  . magnesium oxide (MAG-OX) 400 MG tablet Take 1 tablet (400 mg total) by mouth daily.    . naproxen (NAPROSYN) 500 MG tablet Take 500 mg by mouth 2 (two) times daily.    . nitroGLYCERIN (NITROSTAT) 0.4 MG SL tablet Place 1 tablet (0.4 mg total) under the tongue every 5 (five) minutes as needed for chest pain. 90 tablet 3  . ondansetron (ZOFRAN) 4 MG tablet Take 2 mg by mouth every 8 (eight) hours as needed for nausea or vomiting.     . PROAIR HFA 108 (90 BASE) MCG/ACT inhaler Inhale 1 puff into the lungs every 4 (four) hours as needed for wheezing or shortness of breath.     . spironolactone (ALDACTONE) 25 MG tablet Take 0.5 tablets (12.5 mg total) by mouth daily.   15 tablet 3  . SYNTHROID 100 MCG tablet Take 100 mcg by mouth daily.    . Vitamin D, Ergocalciferol, (DRISDOL) 50000 UNITS CAPS capsule Take 50,000 Units by mouth every 14 (fourteen) days.     No current facility-administered medications for this visit.    Allergies  Allergen Reactions  . Sulfa Antibiotics Hives    Review of Systems negative except from HPI and PMH  Physical Exam BP 134/68 mmHg  Pulse 77  Ht 4\' 11"  (1.499 m)  Wt 161 lb (73.029 kg)  BMI 32.50 kg/m2 Well developed and well nourished in no acute distress HENT normal E scleral and  icterus clear Neck Supple JVP 6-7; carotids brisk and full Clear to ausculation  Regular rate and rhythm, no murmurs gallops or rub Soft with active bowel sounds No clubbing cyanosis   Trace edema  Alert and oriented, grossly normal motor and sensory function Skin Warm and Dry   ECG demonstrates sinus rhythm at 77 Intervals 12/24/43 Left bundle branch block  Assessment and  Plan  Nonischemic cardiomyopathy  Left bundle branch block  Tachycardia  Symptomatic PVCs  Congestive heart failure-chronic-systolic  Will begin on furosemide 20 mg   I think some of her gray outs may be related to positional intolerance although the palpitations and the documentation of atrial tachycardia is supportive of an arrhythmic mechanism.  With her heart failure and her deteriorating LV function in the context of left bundle branch block she is a candidate for ICD implantation for primary prevention as well as CRT for the hopeful mitigation of heart failure symptoms  Have reviewed the potential benefits and risks of ICD implantation including but not limited to death, perforation of heart or lung, lead dislodgement, infection,  device malfunction and inappropriate shocks.  The patient and family express understanding  and are willing to proceed.

## 2014-07-01 NOTE — Patient Instructions (Signed)
Medication Instructions:  Your physician recommends that you continue on your current medications as directed. Please refer to the Current Medication list given to you today.  Labwork: Pre procedure labs today: BMET, CBCD  Testing/Procedures: Your physician has recommended that you have a defibrillator inserted. An implantable cardioverter defibrillator (ICD) is a small device that is placed in your chest or, in rare cases, your abdomen. This device uses electrical pulses or shocks to help control life-threatening, irregular heartbeats that could lead the heart to suddenly stop beating (sudden cardiac arrest). Leads are attached to the ICD that goes into your heart. This is done in the hospital and usually requires an overnight stay. Please see the instruction sheet given to you today for more information.  CRT or cardiac resynchronization therapy is a treatment used to correct heart failure. When you have heart failure your heart is weakened and doesn't pump as well as it should. This therapy may help reduce symptoms and improve the quality of life.    Follow-Up: Your wound check is scheduled for 07/18/14 at 9:30 a.m. at [address]   Thank you for choosing  [facility]!!

## 2014-07-04 ENCOUNTER — Ambulatory Visit (HOSPITAL_COMMUNITY)
Admission: RE | Admit: 2014-07-04 | Discharge: 2014-07-05 | Disposition: A | Discharge status: Home / Self Care | Payer: Medicare Other | Source: Ambulatory Visit | Attending: Internal Medicine | Admitting: Internal Medicine

## 2014-07-04 ENCOUNTER — Encounter: Payer: Self-pay | Admitting: Internal Medicine

## 2014-07-04 ENCOUNTER — Encounter (HOSPITAL_COMMUNITY)
Admission: RE | Disposition: A | Discharge status: Home / Self Care | Payer: Medicare Other | Source: Ambulatory Visit | Attending: Internal Medicine

## 2014-07-04 DIAGNOSIS — I429 Cardiomyopathy, unspecified: Secondary | ICD-10-CM | POA: Diagnosis not present

## 2014-07-04 DIAGNOSIS — Z7982 Long term (current) use of aspirin: Secondary | ICD-10-CM | POA: Insufficient documentation

## 2014-07-04 DIAGNOSIS — I509 Heart failure, unspecified: Secondary | ICD-10-CM | POA: Diagnosis not present

## 2014-07-04 DIAGNOSIS — Z959 Presence of cardiac and vascular implant and graft, unspecified: Secondary | ICD-10-CM

## 2014-07-04 DIAGNOSIS — E039 Hypothyroidism, unspecified: Secondary | ICD-10-CM | POA: Insufficient documentation

## 2014-07-04 DIAGNOSIS — R000 Tachycardia, unspecified: Secondary | ICD-10-CM | POA: Insufficient documentation

## 2014-07-04 DIAGNOSIS — I493 Ventricular premature depolarization: Secondary | ICD-10-CM

## 2014-07-04 DIAGNOSIS — I472 Ventricular tachycardia: Secondary | ICD-10-CM

## 2014-07-04 DIAGNOSIS — I420 Dilated cardiomyopathy: Secondary | ICD-10-CM | POA: Diagnosis present

## 2014-07-04 DIAGNOSIS — I447 Left bundle-branch block, unspecified: Secondary | ICD-10-CM | POA: Diagnosis present

## 2014-07-04 DIAGNOSIS — J45909 Unspecified asthma, uncomplicated: Secondary | ICD-10-CM | POA: Insufficient documentation

## 2014-07-04 DIAGNOSIS — I5022 Chronic systolic (congestive) heart failure: Secondary | ICD-10-CM

## 2014-07-04 DIAGNOSIS — Z4509 Encounter for adjustment and management of other cardiac device: Secondary | ICD-10-CM

## 2014-07-04 DIAGNOSIS — I4729 Other ventricular tachycardia: Secondary | ICD-10-CM

## 2014-07-04 DIAGNOSIS — I428 Other cardiomyopathies: Secondary | ICD-10-CM

## 2014-07-04 DIAGNOSIS — Z01812 Encounter for preprocedural laboratory examination: Secondary | ICD-10-CM

## 2014-07-04 HISTORY — PX: EP IMPLANTABLE DEVICE: SHX172B

## 2014-07-04 LAB — SURGICAL PCR SCREEN
MRSA, PCR: NEGATIVE
Staphylococcus aureus: POSITIVE — AB

## 2014-07-04 SURGERY — BIV ICD INSERTION CRT-D
Anesthesia: LOCAL

## 2014-07-04 MED ORDER — CEFAZOLIN SODIUM-DEXTROSE 2-3 GM-% IV SOLR: 2.0000 g | INTRAVENOUS | Status: DC

## 2014-07-04 MED ORDER — SODIUM CHLORIDE 0.9 % IR SOLN
Status: AC
  Filled 2014-07-04: qty 2

## 2014-07-04 MED ORDER — HEPARIN (PORCINE) IN NACL 2-0.9 UNIT/ML-% IJ SOLN
INTRAMUSCULAR | Status: AC
  Filled 2014-07-04: qty 500

## 2014-07-04 MED ORDER — AMOXICILLIN 500 MG PO CAPS: 2000.0000 mg | ORAL_CAPSULE | Freq: Once | ORAL | Status: DC

## 2014-07-04 MED ORDER — MIDAZOLAM HCL 5 MG/5ML IJ SOLN
INTRAMUSCULAR | Status: AC
  Filled 2014-07-04: qty 5

## 2014-07-04 MED ORDER — SODIUM CHLORIDE 0.9 % IV SOLN
INTRAVENOUS | Status: AC
  Administered 2014-07-04: 16:00:00 via INTRAVENOUS

## 2014-07-04 MED ORDER — MUPIROCIN 2 % EX OINT
1.0000 "application " | TOPICAL_OINTMENT | Freq: Two times a day (BID) | CUTANEOUS | Status: DC
  Administered 2014-07-04 – 2014-07-05 (×2): 1 via NASAL
  Filled 2014-07-04: qty 22

## 2014-07-04 MED ORDER — SODIUM CHLORIDE 0.9 % IR SOLN
80.0000 mg | Status: AC
  Administered 2014-07-04: 80 mg
  Filled 2014-07-04: qty 2

## 2014-07-04 MED ORDER — CARVEDILOL 3.125 MG PO TABS
6.2500 mg | ORAL_TABLET | Freq: Two times a day (BID) | ORAL | Status: DC
  Administered 2014-07-04 – 2014-07-05 (×2): 6.25 mg via ORAL
  Filled 2014-07-04 (×2): qty 2

## 2014-07-04 MED ORDER — VITAMIN D (ERGOCALCIFEROL) 1.25 MG (50000 UNIT) PO CAPS: 50000.0000 [IU] | ORAL_CAPSULE | ORAL | Status: DC

## 2014-07-04 MED ORDER — SODIUM CHLORIDE 0.9 % IV BOLUS (SEPSIS)
INTRAVENOUS | Status: DC | PRN
  Administered 2014-07-04: 250 mL via INTRAVENOUS

## 2014-07-04 MED ORDER — MIDAZOLAM HCL 5 MG/5ML IJ SOLN
INTRAMUSCULAR | Status: DC | PRN
  Administered 2014-07-04 (×3): 1 mg via INTRAVENOUS
  Administered 2014-07-04 (×2): 0.5 mg via INTRAVENOUS

## 2014-07-04 MED ORDER — NITROGLYCERIN 0.4 MG SL SUBL: 0.4000 mg | SUBLINGUAL_TABLET | SUBLINGUAL | Status: DC | PRN

## 2014-07-04 MED ORDER — CEFAZOLIN SODIUM-DEXTROSE 2-3 GM-% IV SOLR
INTRAVENOUS | Status: AC
  Filled 2014-07-04: qty 50

## 2014-07-04 MED ORDER — CHLORHEXIDINE GLUCONATE 4 % EX LIQD: 60.0000 mL | Freq: Once | CUTANEOUS | Status: DC

## 2014-07-04 MED ORDER — OCUVITE PO TABS
1.0000 | ORAL_TABLET | Freq: Every day | ORAL | Status: DC
  Administered 2014-07-04 – 2014-07-05 (×2): 1 via ORAL
  Filled 2014-07-04 (×2): qty 1

## 2014-07-04 MED ORDER — CEFAZOLIN SODIUM-DEXTROSE 2-3 GM-% IV SOLR
INTRAVENOUS | Status: DC | PRN
  Administered 2014-07-04: 2 g via INTRAVENOUS

## 2014-07-04 MED ORDER — CALCIUM CARBONATE ANTACID 500 MG PO CHEW
1.0000 | CHEWABLE_TABLET | Freq: Three times a day (TID) | ORAL | Status: DC | PRN
  Filled 2014-07-04: qty 4

## 2014-07-04 MED ORDER — LIDOCAINE HCL (PF) 1 % IJ SOLN
INTRAMUSCULAR | Status: AC
  Filled 2014-07-04: qty 60

## 2014-07-04 MED ORDER — CEFAZOLIN SODIUM 1-5 GM-% IV SOLN
1.0000 g | Freq: Four times a day (QID) | INTRAVENOUS | Status: AC
  Administered 2014-07-04 – 2014-07-05 (×3): 1 g via INTRAVENOUS
  Filled 2014-07-04 (×5): qty 50

## 2014-07-04 MED ORDER — CHLORHEXIDINE GLUCONATE CLOTH 2 % EX PADS: 6.0000 | MEDICATED_PAD | Freq: Every day | CUTANEOUS | Status: DC

## 2014-07-04 MED ORDER — ONDANSETRON HCL 4 MG/2ML IJ SOLN: 4.0000 mg | Freq: Four times a day (QID) | INTRAMUSCULAR | Status: DC | PRN

## 2014-07-04 MED ORDER — ASPIRIN EC 81 MG PO TBEC
81.0000 mg | DELAYED_RELEASE_TABLET | Freq: Every day | ORAL | Status: DC
  Administered 2014-07-05: 81 mg via ORAL
  Filled 2014-07-04: qty 1

## 2014-07-04 MED ORDER — YOU HAVE A PACEMAKER BOOK
Freq: Once | Status: AC
  Administered 2014-07-04: 21:00:00
  Filled 2014-07-04: qty 1

## 2014-07-04 MED ORDER — LEVOTHYROXINE SODIUM 100 MCG PO TABS: 100.0000 ug | ORAL_TABLET | Freq: Every day | ORAL | Status: DC

## 2014-07-04 MED ORDER — MUPIROCIN 2 % EX OINT
1.0000 "application " | TOPICAL_OINTMENT | Freq: Once | CUTANEOUS | Status: AC
  Administered 2014-07-04: 1 via TOPICAL

## 2014-07-04 MED ORDER — ALBUTEROL SULFATE (2.5 MG/3ML) 0.083% IN NEBU
2.5000 mg | INHALATION_SOLUTION | RESPIRATORY_TRACT | Status: DC | PRN
  Administered 2014-07-05: 2.5 mg via RESPIRATORY_TRACT
  Filled 2014-07-04: qty 3

## 2014-07-04 MED ORDER — LISINOPRIL 5 MG PO TABS
5.0000 mg | ORAL_TABLET | Freq: Every day | ORAL | Status: DC
  Administered 2014-07-05: 5 mg via ORAL
  Filled 2014-07-04 (×2): qty 1

## 2014-07-04 MED ORDER — MAGNESIUM OXIDE 400 (241.3 MG) MG PO TABS
400.0000 mg | ORAL_TABLET | Freq: Every day | ORAL | Status: DC
  Administered 2014-07-04 – 2014-07-05 (×2): 400 mg via ORAL
  Filled 2014-07-04 (×2): qty 1

## 2014-07-04 MED ORDER — LIDOCAINE HCL (PF) 1 % IJ SOLN
INTRAMUSCULAR | Status: AC
  Filled 2014-07-04: qty 30

## 2014-07-04 MED ORDER — ASPIRIN-ACETAMINOPHEN-CAFFEINE 250-250-65 MG PO TABS
1.0000 | ORAL_TABLET | Freq: Four times a day (QID) | ORAL | Status: DC | PRN
  Filled 2014-07-04: qty 1

## 2014-07-04 MED ORDER — ACETAMINOPHEN 325 MG PO TABS
325.0000 mg | ORAL_TABLET | ORAL | Status: DC | PRN
  Administered 2014-07-04 – 2014-07-05 (×4): 650 mg via ORAL
  Filled 2014-07-04 (×4): qty 2

## 2014-07-04 MED ORDER — SODIUM CHLORIDE 0.9 % IV SOLN
INTRAVENOUS | Status: DC
  Administered 2014-07-04: 11:00:00 via INTRAVENOUS

## 2014-07-04 MED ORDER — SPIRONOLACTONE 12.5 MG HALF TABLET
12.5000 mg | ORAL_TABLET | ORAL | Status: DC
  Administered 2014-07-04: 12.5 mg via ORAL
  Filled 2014-07-04 (×2): qty 1

## 2014-07-04 MED ORDER — MUPIROCIN 2 % EX OINT
TOPICAL_OINTMENT | CUTANEOUS | Status: AC
  Filled 2014-07-04: qty 22

## 2014-07-04 MED ORDER — LIDOCAINE HCL (PF) 1 % IJ SOLN
INTRAMUSCULAR | Status: DC | PRN
  Administered 2014-07-04: 70 mL

## 2014-07-04 MED ORDER — FENTANYL CITRATE (PF) 100 MCG/2ML IJ SOLN
INTRAMUSCULAR | Status: AC
  Filled 2014-07-04: qty 2

## 2014-07-04 MED ORDER — FENTANYL CITRATE (PF) 100 MCG/2ML IJ SOLN
INTRAMUSCULAR | Status: DC | PRN
  Administered 2014-07-04: 12.5 ug via INTRAVENOUS
  Administered 2014-07-04: 25 ug via INTRAVENOUS
  Administered 2014-07-04: 12.5 ug via INTRAVENOUS

## 2014-07-04 MED ORDER — NAPROXEN 250 MG PO TABS
500.0000 mg | ORAL_TABLET | Freq: Two times a day (BID) | ORAL | Status: DC
  Administered 2014-07-04: 16:00:00 500 mg via ORAL
  Filled 2014-07-04 (×2): qty 2

## 2014-07-04 MED ORDER — ONDANSETRON HCL 4 MG PO TABS: 2.0000 mg | ORAL_TABLET | Freq: Three times a day (TID) | ORAL | Status: DC | PRN

## 2014-07-04 SURGICAL SUPPLY — 22 items
ADAPTER SEALING SSA-EW-09 (MISCELLANEOUS) ×1 IMPLANT
ADPR INTRO LNG 9FR SL XTD WNG (MISCELLANEOUS) ×1
CABLE SURGICAL S-101-97-12 (CABLE) ×2 IMPLANT
CATH ATTAIN COM SURV 6250V-MB2 (CATHETERS) ×1 IMPLANT
CATH ATTAIN SELECT 6238TEL (CATHETERS) ×1 IMPLANT
ITREVIA HF-T QP 401662 (Pacemaker) ×1 IMPLANT
KIT ACCESSORY SELECTRA FIX CVD (MISCELLANEOUS) ×1 IMPLANT
KIT ESSENTIALS PG (KITS) ×1 IMPLANT
LEAD CAPSURE NOVUS 45CM (Lead) ×1 IMPLANT
LEAD PROTEGO S 65 379969 (Lead) ×1 IMPLANT
LEAD SENTUS OTW QP L-75 408718 (Lead) IMPLANT
PAD DEFIB LIFELINK (PAD) ×1 IMPLANT
SENTUS OTW QP L-75 408718 (Lead) ×2 IMPLANT
SHEATH CLASSIC 7F (SHEATH) ×1 IMPLANT
SHEATH CLASSIC 9.5F (SHEATH) ×1 IMPLANT
SHEATH CLASSIC 9F (SHEATH) ×1 IMPLANT
SHIELD RADPAD SCOOP 12X17 (MISCELLANEOUS) ×1 IMPLANT
SLITTER 6232ADJ (MISCELLANEOUS) ×1 IMPLANT
TOOL TRANSVALV INSERT TVI-07 (MISCELLANEOUS) ×1 IMPLANT
TRAY PACEMAKER INSERTION (CUSTOM PROCEDURE TRAY) ×1 IMPLANT
WIRE ACUITY WHISPER EDS 4648 (WIRE) ×1 IMPLANT
WIRE HI TORQ VERSACORE-J 145CM (WIRE) ×1 IMPLANT

## 2014-07-04 NOTE — H&P (View-Only) (Signed)
Patient Care Team: [name], MD as PCP - General (Family Medicine)   HPI  [name] [name] is a 79 y.o. female Seen in follow-up for nonischemic cardiomyopathy left bundle branch block and nonsustained ventricular tachycardia. Her ejection fraction most recently was 37% disqualifying her for CRT. She did however have symptoms of presyncope associated with palpitations and we undertook an event recorder.  She continues with symptoms of shortness of breath. Repeat assessment of LV function demonstrates an EF of 25-30%.  She also has spells of "graying out". These are all positional and resolved by sitting. They're associated with palpitations that she feels her neck. An event recorder demonstrated an episode of a wide QRS tachycardia with a morphology similar to sinus;  it lasted relatively briefly i.e. about 6 or 8 seconds Past Medical History  Diagnosis Date  . Left bundle branch block   . Asthma   . Thyroid disease   . Arthritis   . Pancreatitis, acute   . Angina effort   . Congestive dilated cardiomyopathy 03/31/2014  . LBBB (left bundle branch block) 10/30/2013    Past Surgical History  Procedure Laterality Date  . Hernia repair    . Abdominal hysterectomy    . Cholecystectomy    . Left heart catheterization with coronary angiogram N/A 11/02/2013    Procedure: LEFT HEART CATHETERIZATION WITH CORONARY ANGIOGRAM;  Surgeon: Corky Crafts, MD;  Location: [facility] CATH LAB;  Service: Cardiovascular;  Laterality: N/A;  . Loop recorder implant N/A 04/13/2014    Procedure: LOOP RECORDER IMPLANT;  Surgeon: Duke Salvia, MD;  Location: [facility] CATH LAB;  Service: Cardiovascular;  Laterality: N/A;    Current Outpatient Prescriptions  Medication Sig Dispense Refill  . amoxicillin (AMOXIL) 500 MG capsule Take 2,000 mg by mouth once. Before dental appointments    . aspirin EC 81 MG tablet Take 1 tablet (81 mg total) by mouth daily. 90 tablet 3  .  aspirin-acetaminophen-caffeine (EXCEDRIN MIGRAINE) 250-250-65 MG per tablet Take 1 tablet by mouth every 6 (six) hours as needed for headache or migraine.    . beta carotene w/minerals (OCUVITE) tablet Take 1 tablet by mouth daily.    . betamethasone dipropionate 0.05 % lotion Apply 1 application topically as needed (for toes).    . calcium carbonate (TUMS - DOSED IN MG ELEMENTAL CALCIUM) 500 MG chewable tablet Chew 1-4 tablets by mouth 3 (three) times daily as needed for indigestion or heartburn.    . carvedilol (COREG) 6.25 MG tablet Take 6.25 mg by mouth 2 (two) times daily.    [name] lisinopril (PRINIVIL,ZESTRIL) 5 MG tablet Take 1 tablet (5 mg total) by mouth daily. 60 tablet 1  . magnesium oxide (MAG-OX) 400 MG tablet Take 1 tablet (400 mg total) by mouth daily.    . naproxen (NAPROSYN) 500 MG tablet Take 500 mg by mouth 2 (two) times daily.    . nitroGLYCERIN (NITROSTAT) 0.4 MG SL tablet Place 1 tablet (0.4 mg total) under the tongue every 5 (five) minutes as needed for chest pain. 90 tablet 3  . ondansetron (ZOFRAN) 4 MG tablet Take 2 mg by mouth every 8 (eight) hours as needed for nausea or vomiting.     [name] PROAIR HFA 108 (90 BASE) MCG/ACT inhaler Inhale 1 puff into the lungs every 4 (four) hours as needed for wheezing or shortness of breath.     . spironolactone (ALDACTONE) 25 MG tablet Take 0.5 tablets (12.5 mg total) by mouth daily.  15 tablet 3  . SYNTHROID 100 MCG tablet Take 100 mcg by mouth daily.    . Vitamin D, Ergocalciferol, (DRISDOL) 50000 UNITS CAPS capsule Take 50,000 Units by mouth every 14 (fourteen) days.     No current facility-administered medications for this visit.    Allergies  Allergen Reactions  . Sulfa Antibiotics Hives    Review of Systems negative except from HPI and PMH  Physical Exam BP 134/68 mmHg  Pulse 77  Ht 4\' 11"  (1.499 m)  Wt 161 lb (73.029 kg)  BMI 32.50 kg/m2 Well developed and well nourished in no acute distress HENT normal E scleral and  icterus clear Neck Supple JVP 6-7; carotids brisk and full Clear to ausculation  Regular rate and rhythm, no murmurs gallops or rub Soft with active bowel sounds No clubbing cyanosis   Trace edema  Alert and oriented, grossly normal motor and sensory function Skin Warm and Dry   ECG demonstrates sinus rhythm at 77 Intervals 12/24/43 Left bundle branch block  Assessment and  Plan  Nonischemic cardiomyopathy  Left bundle branch block  Tachycardia  Symptomatic PVCs  Congestive heart failure-chronic-systolic  Will begin on furosemide 20 mg   I think some of her gray outs may be related to positional intolerance although the palpitations and the documentation of atrial tachycardia is supportive of an arrhythmic mechanism.  With her heart failure and her deteriorating LV function in the context of left bundle branch block she is a candidate for ICD implantation for primary prevention as well as CRT for the hopeful mitigation of heart failure symptoms  Have reviewed the potential benefits and risks of ICD implantation including but not limited to death, perforation of heart or lung, lead dislodgement, infection,  device malfunction and inappropriate shocks.  The patient and family express understanding  and are willing to proceed.

## 2014-07-04 NOTE — Interval H&P Note (Signed)
ICD Criteria  Current LVEF:25% ;Obtained > or = 1 month ago and < or = 3 months ago.  NYHA Functional Classification: Class III  Heart Failure History:  Yes, Duration of heart failure since onset is > 9 months  Non-Ischemic Dilated Cardiomyopathy History:  Yes, timeframe is > 9 months  Atrial Fibrillation/Atrial Flutter:  Yes, A-Fib/A-Flutter type: Paroxysmal.  Ventricular Tachycardia History:  Yes, No hemodynamic instability. VT Type:  NSVT.  Cardiac Arrest History:  No  History of Syndromes with Risk of Sudden Death:  No.  Previous ICD:  No.  Electrophysiology Study: No.  Prior MI: No.  PPM: No.  OSA:  No  Patient Life Expectancy of >=1 year: Yes.  Anticoagulation Therapy:  Patient is NOT on anticoagulation therapy.   Beta Blocker Therapy:  Yes.   Ace Inhibitor/ARB Therapy:  Yes.History and Physical Interval Note:  07/04/2014 11:24 AM  [name] [name]  has presented today for surgery, with the diagnosis of chronic systolic heart failure  The various methods of treatment have been discussed with the patient and family. After consideration of risks, benefits and other options for treatment, the patient has consented to  Procedure(s): BiV ICD Insertion CRT-D (N/A) as a surgical intervention .  The patient's history has been reviewed, patient examined, no change in status, stable for surgery.  I have reviewed the patient's chart and labs.  Questions were answered to the patient's satisfaction.     Sherryl Manges

## 2014-07-05 ENCOUNTER — Ambulatory Visit (HOSPITAL_COMMUNITY): Payer: Medicare Other

## 2014-07-05 DIAGNOSIS — I429 Cardiomyopathy, unspecified: Secondary | ICD-10-CM | POA: Diagnosis not present

## 2014-07-05 DIAGNOSIS — Z7982 Long term (current) use of aspirin: Secondary | ICD-10-CM | POA: Diagnosis not present

## 2014-07-05 DIAGNOSIS — I447 Left bundle-branch block, unspecified: Secondary | ICD-10-CM | POA: Diagnosis not present

## 2014-07-05 DIAGNOSIS — I5022 Chronic systolic (congestive) heart failure: Secondary | ICD-10-CM | POA: Diagnosis not present

## 2014-07-05 MED FILL — Heparin Sodium (Porcine) 2 Unit/ML in Sodium Chloride 0.9%: INTRAMUSCULAR | Qty: 500 | Status: AC

## 2014-07-05 NOTE — Discharge Summary (Signed)
ELECTROPHYSIOLOGY PROCEDURE DISCHARGE SUMMARY    Patient ID: [name] [name],  MRN: [id number], DOB/AGE: 1935/05/16 79 y.o.  Admit date: 07/04/2014 Discharge date: 07/05/2014  Primary Care Physician: [name], MD Primary Cardiologist: Delton See Electrophysiologist: Graciela Husbands  Primary Discharge Diagnosis:  Non ischemic cardiomyopathy, congestive heart failure and LBBB status post CRTD implantation this admission  Secondary Discharge Diagnosis:  1.  Pre-syncope status post ILR implantation 2.  Hypothyroidism 3.  Asthma  Allergies  Allergen Reactions  . Sulfa Antibiotics Hives  . Latex Itching     Procedures This Admission:  1.  Implantation of a BTK CRTD on 07/04/14 by Dr Graciela Husbands.  See op note for full details.  There were no immediate post procedure complications. 2.  CXR on 07/05/14 demonstrated no pneumothorax status post device implantation.   Brief HPI: [name] [name] is a 79 y.o. female with a past medical history as outlined above.  Past medical history includes NICM, CHF, and LBBB.  The patient has persistent LV dysfunction despite guideline directed therapy.  Risks, benefits, and alternatives to ICD implantation were reviewed with the patient who wished to proceed.   Hospital Course:  The patient was admitted and underwent implantation of a BTK CRTD with details as outlined above. She was monitored on telemetry overnight which demonstrated sinus rhythm with ventricular pacing.  Left chest was without hematoma or ecchymosis.  The device was interrogated and found to be functioning normally.  CXR was obtained and demonstrated no pneumothorax status post device implantation.  Wound care, arm mobility, and restrictions were reviewed with the patient.  The patient was examined and considered stable for discharge to home.   The patient's discharge medications include an ACE-I (Lisinopril) and beta blocker (Coreg).   Physical Exam: Filed Vitals:   07/04/14 1900  07/04/14 2018 07/05/14 0013 07/05/14 0539  BP: 112/49 104/42 110/59 117/59  Pulse:  71 70 69  Temp:  97.9 F (36.6 C) 97.6 F (36.4 C) 97.4 F (36.3 C)  TempSrc:  Oral Oral Oral  Resp: 20 16 18 17   Height:      Weight:   164 lb 0.4 oz (74.4 kg)   SpO2:  93% 94% 93%    GEN- The patient is well appearing, alert and oriented x 3 today.   HEENT: normocephalic, atraumatic; sclera clear, conjunctiva pink; hearing intact; oropharynx clear; neck supple  Lungs- Clear to ausculation bilaterally, normal work of breathing.  No wheezes, rales, rhonchi Heart- Regular rate and rhythm, no murmurs, rubs or gallops  GI- soft, non-tender, non-distended, bowel sounds present  Extremities- no clubbing, cyanosis, or edema; DP/PT/radial pulses 2+ bilaterally MS- no significant deformity or atrophy Skin- warm and dry, no rash or lesion, left chest without hematoma/ecchymosis Psych- euthymic mood, full affect Neuro- strength and sensation are intact   Labs:   Lab Results  Component Value Date   WBC 8.7 07/01/2014   HGB 13.5 07/01/2014   HCT 39.5 07/01/2014   MCV 90.3 07/01/2014   PLT 214.0 07/01/2014     Recent Labs Lab 07/01/14 1451  NA 139  K 4.2  CL 103  CO2 29  BUN 12  CREATININE 1.05  CALCIUM 10.6*  GLUCOSE 83    Discharge Medications:    Medication List    TAKE these medications        amoxicillin 500 MG capsule  Commonly known as:  AMOXIL  Take 2,000 mg by mouth once. Before dental appointments     aspirin EC 81  MG tablet  Take 1 tablet (81 mg total) by mouth daily.     aspirin-acetaminophen-caffeine 250-250-65 MG per tablet  Commonly known as:  EXCEDRIN MIGRAINE  Take 1 tablet by mouth every 6 (six) hours as needed for headache or migraine.     beta carotene w/minerals tablet  Take 1 tablet by mouth daily.     betamethasone dipropionate 0.05 % lotion  Apply 1 application topically as needed (for toes).     calcium carbonate 500 MG chewable tablet  Commonly  known as:  TUMS - dosed in mg elemental calcium  Chew 1-4 tablets by mouth 3 (three) times daily as needed for indigestion or heartburn.     carvedilol 6.25 MG tablet  Commonly known as:  COREG  Take 6.25 mg by mouth 2 (two) times daily.     CO Q 10 PO  Take 1 tablet by mouth daily.     lisinopril 5 MG tablet  Commonly known as:  PRINIVIL,ZESTRIL  Take 1 tablet (5 mg total) by mouth daily.     magnesium oxide 400 MG tablet  Commonly known as:  MAG-OX  Take 1 tablet (400 mg total) by mouth daily.     naproxen 500 MG tablet  Commonly known as:  NAPROSYN  Take 500 mg by mouth 2 (two) times daily.     nitroGLYCERIN 0.4 MG SL tablet  Commonly known as:  NITROSTAT  Place 1 tablet (0.4 mg total) under the tongue every 5 (five) minutes as needed for chest pain.     ondansetron 4 MG tablet  Commonly known as:  ZOFRAN  Take 2 mg by mouth every 8 (eight) hours as needed for nausea or vomiting.     PROAIR HFA 108 (90 BASE) MCG/ACT inhaler  Generic drug:  albuterol  Inhale 1 puff into the lungs every 4 (four) hours as needed for wheezing or shortness of breath.     spironolactone 25 MG tablet  Commonly known as:  ALDACTONE  Take 0.5 tablets (12.5 mg total) by mouth daily.     SYNTHROID 100 MCG tablet  Generic drug:  levothyroxine  Take 100 mcg by mouth daily.     Vitamin D (Ergocalciferol) 50000 UNITS Caps capsule  Commonly known as:  DRISDOL  Take 50,000 Units by mouth every 14 (fourteen) days.        Disposition:  Discharge Instructions    Diet - low sodium heart healthy    Complete by:  As directed      Increase activity slowly    Complete by:  As directed           Follow-up Information    Follow up with [facility] On 07/18/2014.   Why:  at 9:30AM   Contact information:   [address] [place] [address]       Duration of Discharge Encounter: Greater than 30 minutes including physician time.  Signed, Gypsy Balsam,  NP 07/05/2014 7:37 AM   EP Attending  Patient seen and examined. Agree with above. Ok for discharge home with usual followup.   Leonia Reeves.D.

## 2014-07-05 NOTE — Progress Notes (Signed)
  Pharmacy Discharge Medication Therapy Review   Total Number of meds on admission ____17______ (polypharmacy > 10 meds)  Indications for all medications: [x]  Yes       []  No  Adherence Review  []  Excellent (no doses missed/week)     []  Good (no more than 1 dose missed/week)     []  Partial (2-3 doses missed/week)     []  Poor (>3 doses missed/week)     [x]  Not Assessed  Total number of high risk medications __3__ (Anticoagulants, Dual antiplatelets, oral Antihyperglycemic agents,Insulins, Antipsychotics, Anti-Seizure meds, Inhalers, HF/ACS meds, Antibiotics and HIV medications)   Assessment: (Medication related problems)  Intervention  YES NO  Explanation   Indications      Medication without noted indication []  [x]     Indication without noted medication []  [x]     Duplicate therapy []  [x]    Efficacy      Suboptimal drug or dose selection []  [x]    Insufficient dose/duration []  [x]    Failure to receive therapy  (Rx not filled) []  [x]     Safety      Adverse drug event []  [x]     Drug interaction [x]  []  Pt found to be taking lisinopril 5 mg by mouth daily as well as naproxen 500 mg by mouth BID.  Counseled pt to try Acetaminophen rather than naproxen to help pain to prevent possible threat to renal function.   Excessive dose/duration []  [x]    High-risk medications []  [x]    Compliance     Underuse []  [x]     Overuse []  [x]    Other pertinent pharmacist counseling []  [x]      Total number of new medications upon discharge: _____0_____  Time:  Time spent preparing for discharge counseling: 0 minutes Time spent counseling patient: 10 minutes Additional time spent on discharge (specify): 10 minutes reviewing AVS and typing progress note   PLAN:  Consider the following at discharge/Recommendations discussed with provider - DC - naproxen 500 mg by mouth BID  Delmar Landau, PharmD candidate

## 2014-07-05 NOTE — Discharge Instructions (Signed)
° ° °  Supplemental Discharge Instructions for  Pacemaker/Defibrillator Patients  Activity No heavy lifting or vigorous activity with your left/right arm for 6 to 8 weeks.  Do not raise your left/right arm above your head for one week.  Gradually raise your affected arm as drawn below.           __   07/07/14                     07/08/14                     07/09/14                      07/10/14  NO DRIVING for 1 week    ; you may begin driving on   04/09/13  .  WOUND CARE - Keep the wound area clean and dry.  Do not get this area wet for one week. No showers for one week; you may shower on   07/10/14  . - The tape/steri-strips on your wound will fall off; do not pull them off.  No bandage is needed on the site.  DO  NOT apply any creams, oils, or ointments to the wound area. - If you notice any drainage or discharge from the wound, any swelling or bruising at the site, or you develop a fever > 101? F after you are discharged home, call the office at once.  Special Instructions - You are still able to use cellular telephones; use the ear opposite the side where you have your pacemaker/defibrillator.  Avoid carrying your cellular phone near your device. - When traveling through airports, show security personnel your identification card to avoid being screened in the metal detectors.  Ask the security personnel to use the hand wand. - Avoid arc welding equipment, MRI testing (magnetic resonance imaging), TENS units (transcutaneous nerve stimulators).  Call the office for questions about other devices. - Avoid electrical appliances that are in poor condition or are not properly grounded. - Microwave ovens are safe to be near or to operate.  Additional information for defibrillator patients should your device go off: - If your device goes off ONCE and you feel fine afterward, notify the device clinic nurses. - If your device goes off ONCE and you do not feel well afterward, call 911. - If your device goes  off TWICE, call 911. - If your device goes off THREE times in one day, call 911.  DO NOT DRIVE YOURSELF OR A FAMILY MEMBER WITH A DEFIBRILLATOR TO THE HOSPITAL--CALL 911.

## 2014-07-05 NOTE — Op Note (Signed)
NAME:  [name] [name]               ACCOUNT NO.:  [id number]  MEDICAL RECORD NO.:  [id number]  LOCATION:  6C06C                        FACILITY:  [facility]  PHYSICIAN:  Duke Salvia, MD, FACCDATE OF BIRTH:  [birth year]  DATE OF PROCEDURE:  07/04/2014 DATE OF DISCHARGE:                              OPERATIVE REPORT   PREOPERATIVE DIAGNOSES:  Nonischemic cardiomyopathy with left bundle- branch block and congestive heart failure.  POSTOPERATIVE DIAGNOSES:  Nonischemic cardiomyopathy with left bundle- branch block and congestive heart failure.  PROCEDURE:  Dual-chamber defibrillator implantation with left ventricular lead placement and high-voltage lead assessment.  DESCRIPTION OF PROCEDURE:  Following obtaining informed consent, the patient was brought to the electrophysiology laboratory and placed on the fluoroscopic table in a supine position.  After routine prep and drape of the left upper chest, lidocaine was infiltrated in the prepectoral subclavicular region and incision was made and carried down to layer of the prepectoral fascia using electrocautery and sharp dissection.  A pocket was formed similarly.  Hemostasis was obtained.  Thereafter, attention was turned to gain access to the extrathoracic left subclavian vein, which was accomplished without difficulty without the aspiration of air or puncture of the artery.  Three separate venipunctures were accomplished and sequentially, an 8-French, 9.5- [place], and 7-French sheaths were placed, which were passed.  A Biotronik Protego 65-cm active fixation single-coil defibrillator lead, serial number [id number], a Medtronic MB2 coronary sinus cannulation sheath and a Medtronic 5076 45-cm active fixation atrial lead, serial number [id number].  Under fluoroscopic guidance, the RV lead was manipulated to the apex with a bipolar R-wave of 10.9 with a pace impedance of 660, threshold 0.7 V at 0.4 milliseconds.  Current threshold was  about 1.0 mA.  There was no diaphragmatic pacing at 10 V and the current of injury was brisk. This lead was secured to the prepectoral fascia.  We then gained access of the coronary sinus.  A series of veins were identified initially, which arrived off a bifurcation in the midlateral portion.  Initially, the higher vein was targeted, the lead was deployed; however, across the entire lead, neither thresholds were adequate nor were free of diaphragmatic stimulation.  We then targeted the lower branch.  We used a Medtronic subselection catheter, which allowed [place] to pass the wire and we then tried two different leads ultimately utilizing the Sentus QP L75 lead, serial number [id number]. Under fluoroscopic guidance, it was manipulated to the junction of the mid and distal third.  Multiple inadequate pacing thresholds, but 2-coil and 3-coil severed quite nicely with the bipolar L-wave was 12.4, the pace impedance of 358, and a threshold of 1.5 V at 0.4 milliseconds. There was no diaphragmatic pacing at 10 V.  The lead was secured to the prepectoral fascia.  I should note that the leads were deployed couple of times and the fixation mechanism made it very difficult to deploy the leads as displacing themselves.  On one occasion, we ended up having to recannulate the coronary sinus because of the system stability. Ultimately, the LV lead was deployed and secured.  We then deployed the right atrial lead.  Multiple sites in the right atrium were mapped (greater than 15)  before we finally found a site that had a measurable P waves.  In this location, the P-wave was 2.1 with a pace impedance of 803, a threshold of 1.5 V at 0.4 milliseconds.  Current of threshold was 2.0 mA.  There was no diaphragmatic pacing at 10 V and the current of injury was brisk.  This lead was secured to the prepectoral fascia.  The pocket was then prepared for pocket insertion.  It was copiously irrigated with  antibiotic-containing saline solution.  Leads in the pulse generator were placed in the pocket and the device was placed upside down because the head of the device was about 25-50% thicker than the foot of the device.  This device was not secured to the prepectoral fascia.  Hemostasis was obtained.  Through the device, bipolar P-wave was 1.4 with a pace impedance of 695.  The pacing threshold 1 of V at 0.4.  The L-wave was 16.6 with a pace impedance of 348, a threshold of 1.3 at 0.4 in an LV2- RV coil configuration and the RV was 20.7 with a pace impedance of 579, a threshold of 0.4 at 0.4.  High-voltage impedance was 80 ohms.  The pocket was irrigated as noted.  The wound was then closed in two layers in normal fashion.  The wound was washed, dried and a benzoin and Dermabond dressing were then applied.  Needle counts, sponge counts, and instrument counts were correct at the end of the procedure according to the staff.  We then turned our attention to removing the loop recorder.  Palpation mislead me to a more distal location on the recorder fluoroscopically. I was also mislead towards its distal tip.  We then made an incision and turned out that the tip of the recorder was about 1 cm more proximal than my initial incision.  So, I had to make a second incision and the recorder was then removed.  Benzoin and Steri-Strip dressing were applied.  The patient tolerated the procedure without apparent complication, although, she was at fair amount of moaning for shoulder discomfort.  Estimated blood loss was minimal.     Duke Salvia, MD, [facility]     SCK/MEDQ  D:  07/04/2014  T:  07/05/2014  Job:  [id number]

## 2014-07-08 ENCOUNTER — Encounter (HOSPITAL_COMMUNITY): Payer: Self-pay | Admitting: Internal Medicine

## 2014-07-12 ENCOUNTER — Ambulatory Visit (INDEPENDENT_AMBULATORY_CARE_PROVIDER_SITE_OTHER): Payer: Medicare Other | Admitting: *Deleted

## 2014-07-12 DIAGNOSIS — R002 Palpitations: Secondary | ICD-10-CM

## 2014-07-13 NOTE — Progress Notes (Signed)
ILR removed at time of CRTD implant per op note.  No charge for remote monitoring for ILR done over the last month.

## 2014-07-16 ENCOUNTER — Observation Stay (HOSPITAL_COMMUNITY)
Admission: EM | Admit: 2014-07-16 | Discharge: 2014-07-18 | Disposition: A | Discharge status: Home / Self Care | Payer: Medicare Other | Attending: Family Medicine | Admitting: Family Medicine

## 2014-07-16 DIAGNOSIS — I447 Left bundle-branch block, unspecified: Secondary | ICD-10-CM | POA: Diagnosis present

## 2014-07-16 DIAGNOSIS — R079 Chest pain, unspecified: Secondary | ICD-10-CM | POA: Diagnosis present

## 2014-07-16 DIAGNOSIS — E039 Hypothyroidism, unspecified: Secondary | ICD-10-CM | POA: Insufficient documentation

## 2014-07-16 DIAGNOSIS — E785 Hyperlipidemia, unspecified: Secondary | ICD-10-CM | POA: Diagnosis not present

## 2014-07-16 DIAGNOSIS — Z91040 Latex allergy status: Secondary | ICD-10-CM | POA: Insufficient documentation

## 2014-07-16 DIAGNOSIS — Z950 Presence of cardiac pacemaker: Secondary | ICD-10-CM | POA: Insufficient documentation

## 2014-07-16 DIAGNOSIS — M1990 Unspecified osteoarthritis, unspecified site: Secondary | ICD-10-CM | POA: Diagnosis not present

## 2014-07-16 DIAGNOSIS — Z7982 Long term (current) use of aspirin: Secondary | ICD-10-CM | POA: Diagnosis not present

## 2014-07-16 DIAGNOSIS — I5022 Chronic systolic (congestive) heart failure: Secondary | ICD-10-CM | POA: Insufficient documentation

## 2014-07-16 DIAGNOSIS — M545 Low back pain: Secondary | ICD-10-CM | POA: Diagnosis not present

## 2014-07-16 DIAGNOSIS — I209 Angina pectoris, unspecified: Secondary | ICD-10-CM | POA: Insufficient documentation

## 2014-07-16 DIAGNOSIS — I420 Dilated cardiomyopathy: Secondary | ICD-10-CM | POA: Insufficient documentation

## 2014-07-16 DIAGNOSIS — J45909 Unspecified asthma, uncomplicated: Secondary | ICD-10-CM | POA: Insufficient documentation

## 2014-07-16 DIAGNOSIS — Z882 Allergy status to sulfonamides status: Secondary | ICD-10-CM | POA: Diagnosis not present

## 2014-07-16 DIAGNOSIS — T50905A Adverse effect of unspecified drugs, medicaments and biological substances, initial encounter: Secondary | ICD-10-CM

## 2014-07-16 DIAGNOSIS — R112 Nausea with vomiting, unspecified: Secondary | ICD-10-CM | POA: Insufficient documentation

## 2014-07-16 DIAGNOSIS — M549 Dorsalgia, unspecified: Secondary | ICD-10-CM

## 2014-07-16 DIAGNOSIS — Z79899 Other long term (current) drug therapy: Secondary | ICD-10-CM | POA: Diagnosis not present

## 2014-07-16 DIAGNOSIS — I201 Angina pectoris with documented spasm: Secondary | ICD-10-CM | POA: Diagnosis present

## 2014-07-17 ENCOUNTER — Emergency Department (HOSPITAL_COMMUNITY): Payer: Medicare Other

## 2014-07-17 ENCOUNTER — Encounter (HOSPITAL_COMMUNITY): Payer: Self-pay | Admitting: Emergency Medicine

## 2014-07-17 DIAGNOSIS — I5022 Chronic systolic (congestive) heart failure: Secondary | ICD-10-CM

## 2014-07-17 DIAGNOSIS — R079 Chest pain, unspecified: Secondary | ICD-10-CM | POA: Diagnosis not present

## 2014-07-17 DIAGNOSIS — T50905A Adverse effect of unspecified drugs, medicaments and biological substances, initial encounter: Secondary | ICD-10-CM | POA: Diagnosis present

## 2014-07-17 DIAGNOSIS — I201 Angina pectoris with documented spasm: Secondary | ICD-10-CM | POA: Diagnosis present

## 2014-07-17 DIAGNOSIS — E039 Hypothyroidism, unspecified: Secondary | ICD-10-CM | POA: Diagnosis present

## 2014-07-17 DIAGNOSIS — M549 Dorsalgia, unspecified: Secondary | ICD-10-CM | POA: Diagnosis present

## 2014-07-17 DIAGNOSIS — R112 Nausea with vomiting, unspecified: Secondary | ICD-10-CM | POA: Diagnosis present

## 2014-07-17 DIAGNOSIS — M545 Low back pain: Secondary | ICD-10-CM | POA: Diagnosis not present

## 2014-07-17 LAB — CBC WITH DIFFERENTIAL/PLATELET
Basophils Absolute: 0.1 10*3/uL (ref 0.0–0.1)
Basophils Absolute: 0.1 10*3/uL (ref 0.0–0.1)
Basophils Relative: 1 % (ref 0–1)
Basophils Relative: 1 % (ref 0–1)
EOS ABS: 0.5 10*3/uL (ref 0.0–0.7)
EOS ABS: 0.5 10*3/uL (ref 0.0–0.7)
EOS PCT: 8 % — AB (ref 0–5)
Eosinophils Relative: 6 % — ABNORMAL HIGH (ref 0–5)
HCT: 35.8 % — ABNORMAL LOW (ref 36.0–46.0)
HCT: 35.9 % — ABNORMAL LOW (ref 36.0–46.0)
Hemoglobin: 12.1 g/dL (ref 12.0–15.0)
Hemoglobin: 12.1 g/dL (ref 12.0–15.0)
LYMPHS ABS: 1.3 10*3/uL (ref 0.7–4.0)
LYMPHS ABS: 1 10*3/uL (ref 0.7–4.0)
Lymphocytes Relative: 22 % (ref 12–46)
Lymphocytes Relative: 13 % (ref 12–46)
MCH: 30.2 pg (ref 26.0–34.0)
MCH: 30.3 pg (ref 26.0–34.0)
MCHC: 33.8 g/dL (ref 30.0–36.0)
MCHC: 33.7 g/dL (ref 30.0–36.0)
MCV: 89.3 fL (ref 78.0–100.0)
MCV: 90 fL (ref 78.0–100.0)
MONO ABS: 0.4 10*3/uL (ref 0.1–1.0)
MONOS PCT: 7 % (ref 3–12)
MONOS PCT: 8 % (ref 3–12)
Monocytes Absolute: 0.6 10*3/uL (ref 0.1–1.0)
NEUTROS ABS: 3.7 10*3/uL (ref 1.7–7.7)
NEUTROS PCT: 62 % (ref 43–77)
Neutro Abs: 5.5 10*3/uL (ref 1.7–7.7)
Neutrophils Relative %: 72 % (ref 43–77)
PLATELETS: 156 10*3/uL (ref 150–400)
Platelets: 159 10*3/uL (ref 150–400)
RBC: 4.01 MIL/uL (ref 3.87–5.11)
RBC: 3.99 MIL/uL (ref 3.87–5.11)
RDW: 13.8 % (ref 11.5–15.5)
RDW: 14 % (ref 11.5–15.5)
WBC: 6 10*3/uL (ref 4.0–10.5)
WBC: 7.5 10*3/uL (ref 4.0–10.5)

## 2014-07-17 LAB — BASIC METABOLIC PANEL
Anion gap: 9 (ref 5–15)
Anion gap: 8 (ref 5–15)
BUN: 7 mg/dL (ref 6–20)
BUN: 12 mg/dL (ref 6–20)
CALCIUM: 10.2 mg/dL (ref 8.9–10.3)
CO2: 25 mmol/L (ref 22–32)
CO2: 24 mmol/L (ref 22–32)
CREATININE: 0.91 mg/dL (ref 0.44–1.00)
Calcium: 9.8 mg/dL (ref 8.9–10.3)
Chloride: 107 mmol/L (ref 101–111)
Chloride: 108 mmol/L (ref 101–111)
Creatinine, Ser: 0.98 mg/dL (ref 0.44–1.00)
GFR, EST NON AFRICAN AMERICAN: 60 mL/min — AB (ref >60)
GFR, EST NON AFRICAN AMERICAN: 55 mL/min — AB (ref >60)
Glucose, Bld: 107 mg/dL — ABNORMAL HIGH (ref 65–99)
Glucose, Bld: 121 mg/dL — ABNORMAL HIGH (ref 65–99)
POTASSIUM: 4.3 mmol/L (ref 3.5–5.1)
Potassium: 3.9 mmol/L (ref 3.5–5.1)
SODIUM: 140 mmol/L (ref 135–145)
Sodium: 141 mmol/L (ref 135–145)

## 2014-07-17 LAB — HEPATIC FUNCTION PANEL
ALBUMIN: 3.5 g/dL (ref 3.5–5.0)
ALK PHOS: 73 U/L (ref 38–126)
ALT: 26 U/L (ref 14–54)
AST: 76 U/L — AB (ref 15–41)
BILIRUBIN DIRECT: 0.2 mg/dL (ref 0.1–0.5)
BILIRUBIN TOTAL: 0.6 mg/dL (ref 0.3–1.2)
Indirect Bilirubin: 0.4 mg/dL (ref 0.3–0.9)
Total Protein: 6.3 g/dL — ABNORMAL LOW (ref 6.5–8.1)

## 2014-07-17 LAB — COMPREHENSIVE METABOLIC PANEL
ALK PHOS: 76 U/L (ref 38–126)
ALT: 24 U/L (ref 14–54)
ANION GAP: 7 (ref 5–15)
AST: 44 U/L — ABNORMAL HIGH (ref 15–41)
Albumin: 3.4 g/dL — ABNORMAL LOW (ref 3.5–5.0)
BILIRUBIN TOTAL: 0.9 mg/dL (ref 0.3–1.2)
BUN: 11 mg/dL (ref 6–20)
CO2: 26 mmol/L (ref 22–32)
Calcium: 9.2 mg/dL (ref 8.9–10.3)
Chloride: 104 mmol/L (ref 101–111)
Creatinine, Ser: 1.08 mg/dL — ABNORMAL HIGH (ref 0.44–1.00)
GFR calc Af Amer: 56 mL/min — ABNORMAL LOW (ref >60)
GFR calc non Af Amer: 49 mL/min — ABNORMAL LOW (ref >60)
GLUCOSE: 108 mg/dL — AB (ref 65–99)
POTASSIUM: 4 mmol/L (ref 3.5–5.1)
Sodium: 137 mmol/L (ref 135–145)
Total Protein: 6.3 g/dL — ABNORMAL LOW (ref 6.5–8.1)

## 2014-07-17 LAB — I-STAT TROPONIN, ED: Troponin i, poc: 0 ng/mL (ref 0.00–0.08)

## 2014-07-17 LAB — LIPASE, BLOOD
LIPASE: 80 U/L — AB (ref 22–51)
Lipase: 22 U/L (ref 22–51)

## 2014-07-17 LAB — BRAIN NATRIURETIC PEPTIDE: B Natriuretic Peptide: 619.8 pg/mL — ABNORMAL HIGH (ref 0.0–100.0)

## 2014-07-17 LAB — TROPONIN I
Troponin I: <0.03 ng/mL (ref <0.031)
Troponin I: <0.03 ng/mL (ref <0.031)
Troponin I: <0.03 ng/mL (ref <0.031)

## 2014-07-17 LAB — MAGNESIUM: Magnesium: 1.7 mg/dL (ref 1.7–2.4)

## 2014-07-17 MED ORDER — ONDANSETRON HCL 4 MG/2ML IJ SOLN
4.0000 mg | Freq: Four times a day (QID) | INTRAMUSCULAR | Status: DC | PRN
  Administered 2014-07-17 – 2014-07-18 (×2): 4 mg via INTRAVENOUS
  Filled 2014-07-17 (×2): qty 2

## 2014-07-17 MED ORDER — ACETAMINOPHEN 650 MG RE SUPP: 650.0000 mg | Freq: Four times a day (QID) | RECTAL | Status: DC | PRN

## 2014-07-17 MED ORDER — CARVEDILOL 6.25 MG PO TABS
6.2500 mg | ORAL_TABLET | Freq: Two times a day (BID) | ORAL | Status: DC
  Administered 2014-07-17 – 2014-07-18 (×4): 6.25 mg via ORAL
  Filled 2014-07-17 (×5): qty 1

## 2014-07-17 MED ORDER — KETOROLAC TROMETHAMINE 60 MG/2ML IM SOLN: 60.0000 mg | Freq: Once | INTRAMUSCULAR | Status: DC

## 2014-07-17 MED ORDER — HYDROCODONE-ACETAMINOPHEN 5-325 MG PO TABS
2.0000 | ORAL_TABLET | Freq: Once | ORAL | Status: AC
  Administered 2014-07-17: 2 via ORAL
  Filled 2014-07-17: qty 2

## 2014-07-17 MED ORDER — ACETAMINOPHEN 325 MG PO TABS
650.0000 mg | ORAL_TABLET | Freq: Four times a day (QID) | ORAL | Status: DC | PRN
  Administered 2014-07-18: 650 mg via ORAL
  Filled 2014-07-17: qty 2

## 2014-07-17 MED ORDER — NITROGLYCERIN 0.4 MG SL SUBL: 0.4000 mg | SUBLINGUAL_TABLET | SUBLINGUAL | Status: DC | PRN

## 2014-07-17 MED ORDER — ONDANSETRON HCL 4 MG PO TABS
4.0000 mg | ORAL_TABLET | Freq: Four times a day (QID) | ORAL | Status: DC | PRN
  Filled 2014-07-17: qty 1

## 2014-07-17 MED ORDER — ENOXAPARIN SODIUM 40 MG/0.4ML ~~LOC~~ SOLN
40.0000 mg | SUBCUTANEOUS | Status: DC
  Administered 2014-07-17 – 2014-07-18 (×2): 40 mg via SUBCUTANEOUS
  Filled 2014-07-17 (×2): qty 0.4

## 2014-07-17 MED ORDER — LISINOPRIL 5 MG PO TABS
5.0000 mg | ORAL_TABLET | Freq: Every day | ORAL | Status: DC
  Administered 2014-07-17 – 2014-07-18 (×2): 5 mg via ORAL
  Filled 2014-07-17 (×2): qty 1

## 2014-07-17 MED ORDER — ASPIRIN EC 81 MG PO TBEC
81.0000 mg | DELAYED_RELEASE_TABLET | Freq: Every day | ORAL | Status: DC
  Administered 2014-07-17 – 2014-07-18 (×2): 81 mg via ORAL
  Filled 2014-07-17 (×2): qty 1

## 2014-07-17 MED ORDER — MAGNESIUM OXIDE 400 (241.3 MG) MG PO TABS
400.0000 mg | ORAL_TABLET | Freq: Every day | ORAL | Status: DC
  Administered 2014-07-17 – 2014-07-18 (×2): 400 mg via ORAL
  Filled 2014-07-17 (×2): qty 1

## 2014-07-17 MED ORDER — CALCIUM CARBONATE ANTACID 500 MG PO CHEW
1.0000 | CHEWABLE_TABLET | Freq: Three times a day (TID) | ORAL | Status: DC | PRN
  Filled 2014-07-17: qty 4

## 2014-07-17 MED ORDER — HYDROCODONE-ACETAMINOPHEN 5-325 MG PO TABS: 1.0000 | ORAL_TABLET | Freq: Two times a day (BID) | ORAL | Status: DC | PRN

## 2014-07-17 MED ORDER — KETOROLAC TROMETHAMINE 30 MG/ML IJ SOLN
30.0000 mg | Freq: Once | INTRAMUSCULAR | Status: AC
  Administered 2014-07-17: 30 mg via INTRAVENOUS
  Filled 2014-07-17: qty 1

## 2014-07-17 MED ORDER — LEVOTHYROXINE SODIUM 100 MCG PO TABS
100.0000 ug | ORAL_TABLET | Freq: Every day | ORAL | Status: DC
Start: 2014-07-17 — End: 2014-07-18
  Administered 2014-07-17 – 2014-07-18 (×2): 100 ug via ORAL
  Filled 2014-07-17 (×3): qty 1

## 2014-07-17 MED ORDER — ONDANSETRON HCL 4 MG PO TABS: 2.0000 mg | ORAL_TABLET | Freq: Three times a day (TID) | ORAL | Status: DC | PRN

## 2014-07-17 MED ORDER — ONDANSETRON HCL 4 MG/2ML IJ SOLN
4.0000 mg | Freq: Once | INTRAMUSCULAR | Status: AC
  Administered 2014-07-17: 4 mg via INTRAVENOUS
  Filled 2014-07-17: qty 2

## 2014-07-17 MED ORDER — SPIRONOLACTONE 12.5 MG HALF TABLET
12.5000 mg | ORAL_TABLET | ORAL | Status: DC
  Administered 2014-07-18: 12.5 mg via ORAL
  Filled 2014-07-17: qty 1

## 2014-07-17 MED ORDER — ALBUTEROL SULFATE (2.5 MG/3ML) 0.083% IN NEBU: 3.0000 mL | INHALATION_SOLUTION | RESPIRATORY_TRACT | Status: DC | PRN

## 2014-07-17 MED ORDER — SODIUM CHLORIDE 0.9 % IJ SOLN
3.0000 mL | Freq: Two times a day (BID) | INTRAMUSCULAR | Status: DC
  Administered 2014-07-17 (×2): 3 mL via INTRAVENOUS
  Administered 2014-07-18: 10 mL via INTRAVENOUS

## 2014-07-17 NOTE — ED Notes (Signed)
Pt had another episode of severe nausea and diaphoresis. Nausea medication given. 2L oxygen placed on patient for comfort. Pt feeling better at this time.

## 2014-07-17 NOTE — Progress Notes (Signed)
Contacted [employer] to interrogate pacemaker.

## 2014-07-17 NOTE — ED Notes (Signed)
Family at bedside. 

## 2014-07-17 NOTE — Discharge Instructions (Signed)
Sciatica Ms. Neer, take Motrin as needed for your pain. If it becomes severe, take Norco. See your primary care physician within 3 days for close follow-up. If symptoms worsen come back to emergency department immediately. Thank you. Sciatica is pain, weakness, numbness, or tingling along your sciatic nerve. The nerve starts in the lower back and runs down the back of each leg. Nerve damage or certain conditions pinch or put pressure on the sciatic nerve. This causes the pain, weakness, and other discomforts of sciatica. HOME CARE   Only take medicine as told by your doctor.  Apply ice to the affected area for 20 minutes. Do this 3-4 times a day for the first 48-72 hours. Then try heat in the same way.  Exercise, stretch, or do your usual activities if these do not make your pain worse.  Go to physical therapy as told by your doctor.  Keep all doctor visits as told.  Do not wear high heels or shoes that are not supportive.  Get a firm mattress if your mattress is too soft to lessen pain and discomfort. GET HELP RIGHT AWAY IF:   You cannot control when you poop (bowel movement) or pee (urinate).  You have more weakness in your lower back, lower belly (pelvis), butt (buttocks), or legs.  You have redness or puffiness (swelling) of your back.  You have a burning feeling when you pee.  You have pain that gets worse when you lie down.  You have pain that wakes you from your sleep.  Your pain is worse than past pain.  Your pain lasts longer than 4 weeks.  You are suddenly losing weight without reason. MAKE SURE YOU:   Understand these instructions.  Will watch this condition.  Will get help right away if you are not doing well or get worse. Document Released: 10/03/2007 Document Revised: 06/25/2011 Document Reviewed: 05/05/2011 [facility] Patient Information 2015 [place], [place]. This information is not intended to replace advice given to you by your health care provider. Make  sure you discuss any questions you have with your health care provider.

## 2014-07-17 NOTE — ED Notes (Signed)
Dr. Oni at bedside. 

## 2014-07-17 NOTE — Evaluation (Signed)
Physical Therapy Evaluation Patient Details Name: [name] [name] MRN: [id number] DOB: [birth year] Today's Date: 07/17/2014   History of Present Illness  Patient is a 79 yo female admitted 07/16/14 with low back pain and chest pain.  PMH:  NICM, defibrillator implanted 2 weeks ago, asthma, CHF, severe scoliosis  Clinical Impression  Patient is functioning at supervision to independent level for all mobility and gait.  Patient reports she is at her baseline functional level.  No acute PT needs identified - PT will sign off.  Encouraged ambulation in hallway with nursing.    Follow Up Recommendations No PT follow up;Supervision for mobility/OOB    Equipment Recommendations  None recommended by PT    Recommendations for Other Services       Precautions / Restrictions Precautions Precautions: Fall;ICD/Pacemaker Precaution Comments: Defibrillator 2 weeks ago Restrictions Weight Bearing Restrictions:  (No orders) Other Position/Activity Restrictions: No orders for WB restrictions.  Recent defibrillator insertion - limit weight on LUE.      Mobility  Bed Mobility Overal bed mobility: Independent             General bed mobility comments: Patient able to don socks in sitting with good balance.  Transfers Overall transfer level: Independent Equipment used: None             General transfer comment: Good balance in stance.  Ambulation/Gait Ambulation/Gait assistance: Supervision Ambulation Distance (Feet): 180 Feet Assistive device: None Gait Pattern/deviations: Step-through pattern;Decreased step length - right;Decreased step length - left;Decreased stance time - left Gait velocity: Decreased Gait velocity interpretation: Below normal speed for age/gender General Gait Details: Patient with severe scoliosis impacting pelvis alignment and gait.  Patient with good balance with gait.  Patient reports at her baseline.  Stairs            Wheelchair Mobility     Modified Rankin (Stroke Patients Only)       Balance Overall balance assessment: No apparent balance deficits (not formally assessed)                                           Pertinent Vitals/Pain Pain Assessment: 0-10 Pain Score: 3  Pain Location: Rt posterior flank Pain Descriptors / Indicators: Aching;Sore Pain Intervention(s): Monitored during session;Repositioned    Home Living Family/patient expects to be discharged to:: Private residence Living Arrangements: Spouse/significant other Available Help at Discharge: Family;Available 24 hours/day (Husband uses cane; Daughter from [place] coming to help) Type of Home: House Home Access: Level entry     Home Layout: Two level;Able to live on main level with bedroom/bathroom Home Equipment: Dan Humphreys - 2 wheels;Wheelchair - manual;Grab bars - tub/shower;Shower seat;Bedside commode      Prior Function Level of Independence: Independent               Hand Dominance   Dominant Hand: Right    Extremity/Trunk Assessment   Upper Extremity Assessment: Overall WFL for tasks assessed           Lower Extremity Assessment: Generalized weakness      Cervical / Trunk Assessment: Other exceptions  Communication   Communication: No difficulties  Cognition Arousal/Alertness: Awake/alert Behavior During Therapy: WFL for tasks assessed/performed Overall Cognitive Status: Within Functional Limits for tasks assessed (Repeated herself at times)  General Comments      Exercises        Assessment/Plan    PT Assessment Patent does not need any further PT services  PT Diagnosis Abnormality of gait;Acute pain   PT Problem List    PT Treatment Interventions     PT Goals (Current goals can be found in the Care Plan section) Acute Rehab PT Goals PT Goal Formulation: All assessment and education complete, DC therapy    Frequency     Barriers to discharge         Co-evaluation               End of Session   Activity Tolerance: Patient tolerated treatment well Patient left: in bed;with call bell/phone within reach;with family/visitor present (sitting EOB) Nurse Communication: Mobility status (Encouraged ambulation in hallway with nursing)    Functional Assessment Tool Used: Clinical judgement Functional Limitation: Mobility: Walking and moving around Mobility: Walking and Moving Around Current Status (Y0511): At least 1 percent but less than 20 percent impaired, limited or restricted Mobility: Walking and Moving Around Goal Status [phone]): At least 1 percent but less than 20 percent impaired, limited or restricted Mobility: Walking and Moving Around Discharge Status [phone]): At least 1 percent but less than 20 percent impaired, limited or restricted    Time: 0141-0301 PT Time Calculation (min) (ACUTE ONLY): 28 min   Charges:   PT Evaluation $Initial PT Evaluation Tier I: 1 Procedure PT Treatments $Gait Training: 8-22 mins   PT G Codes:   PT G-Codes **NOT FOR INPATIENT CLASS** Functional Assessment Tool Used: Clinical judgement Functional Limitation: Mobility: Walking and moving around Mobility: Walking and Moving Around Current Status (T1438): At least 1 percent but less than 20 percent impaired, limited or restricted Mobility: Walking and Moving Around Goal Status [phone]): At least 1 percent but less than 20 percent impaired, limited or restricted Mobility: Walking and Moving Around Discharge Status [phone]): At least 1 percent but less than 20 percent impaired, limited or restricted    Vena Austria 07/17/2014, 5:30 PM Durenda Hurt. Renaldo Fiddler, [facility] Acute Rehab Services Pager [phone]

## 2014-07-17 NOTE — Progress Notes (Addendum)
3:25 PM I agree with HPI/GPe and A/P per Dr. Midge Minium      79 y/o NICM s/p Biotronik recently AICD/DEfib, Chr LBBB piror to an elective knee surgery-s/p neg Lexiscan at [facility] has been managed since 2015 by Dr. Delton See for Angina -Had a negative cardiac catheterization 12/2013 however echocardiogram = 30% Has had Prinzmetal angina And is classified as NYHA class III Cardiac MRI 03/17/14 showed LVEF 37%  She was referred to electrophysiology and as medical management options had been depleted for NYHA class III and had a wide QRS had the pacemaker placed  She presented to emergency room primarily with low back pain. She has a history of having epidural start injections for this which is chronic. In the emergency room she received a cocktail of medications including Toradol and Norco in the emergency room and was about to be discharged when she started having indigestion and midsternal pressure with associated diaphoresis EKG on admission showed chronic left bundle branch block without inversions and troponins so far have been negative   BNP was slightly elevated at 619 Lipase was 80 Hemoglobin 12 WBC 7.5  Principal Problem:   Drug-induced nausea and vomiting -Symptomatology likely explainable by medications.  She'll be given a clear liquid diet and this will be graduated as tolerated -Lipase is 80 is a sensitive indicator for pancreatitis -Clinically if she has further nausea vomiting we would consider CT scan abdomen pelvis to rule out acute pancreatitis -We will repeat labs in the morning Active Problems:   Prinzmetal variant angina- -She is on maximal medical therapy including Coreg 6.25 twice a day, lisinopril 5, Aldactone 12.5 3 times a week -I will let her cardiologist Dr. Delton See now that she is here   LBBB (left bundle branch block), status post Biotronik AICD -Pacemaker was interrogated remotely 7/9 and was intact -She is pacing fine on the  monitors -Dr. Ladona Ridgel of electrophysiology discussed her case with me and unless there are any further findings, she may be okay to discharge home if troponins and symptomatology resolved   Chronic systolic CHF (congestive heart failure), NYHA class 3 -Continue Aldactone as above   Back pain -Chronic-will need outpatient follow-up.   Hypothyroidism -Continue Synthroid 100 g daily -Needs TSH 2-4 weeks   Hyperlipidemia -Patient on red rice yeast per cardiology -Active ingredient is lovastatin--> it may be reasonable to substitute lovastatin for reck rice yeast -No current evidence for replacement of coenzyme Q for statin myopathy but I will defer this once again to cardiologist   Headaches -Excedrin Migraine on hold       HEENTalert pleasant oriented no distress just awoke  CHEST clinically clear no added sound  CARDIAC no chest wall tenderness, pacemaker incision site and Steri-Strips are off and looks clean and healed by secondary intention  ABDOMEN SOFT NONTENDER NONDISTENDED NO REBOUND  NEURO neurologically intact    Addend  Initial Lipase 81 Tells me she has had severe GB diease ~ 2006 s/p emergent GB surgery Re-examining patient she has RUQ tenderness slighlty andnursing reports has felt somewhat nauseous today  P Rpt  Lipase Get LFT Grad diet cautiously If tol PO and CE's neg and EKG neg-potential d/c am

## 2014-07-17 NOTE — Progress Notes (Signed)
Patient arrived to 3e04 for CP observation. A&0 X 4. Vital Signs stable. No complaints of pain. V Paced on telemetry. Wound to left chest per recent pacemaker placement. No drainage noted.

## 2014-07-17 NOTE — H&P (Signed)
[facility] History and Physical  [name] [name] WUJ:[id number] DOB: 1935-08-16 DOA: 07/16/2014  Referring physician: Dr. Mora Bellman. PCP: [name], MD  Specialists: Dr.Klien. Cardiologist.  Chief Complaint: Chest pain.  HPI: [name] [name] is a 79 y.o. female with a history of nonischemic cardiomyopathy status post defibrillator placement 2 weeks ago presents to the ER because of low back pain. Patient states yesterday morning while walking she may have twisted her back for which patient had low back pain radiating to her left lower extremity. Since the pain worsened patient came to the ER. In the ER patient started developing nausea with chest pain which was squeezing type nonradiating. Patient's chest pain lasted for less than a minute each time and resolved spontaneously without any intervention. EKG cardiac markers and chest x-ray were unremarkable. Patient in addition has been having some nausea. Denies abdominal pain. Patient has been admitted for further management. Patient's low back pain has improved with pain relief medications. Patient denies any shortness of breath or productive cough. Defibrillator site looks healed.   Review of Systems: As presented in the history of presenting illness, rest negative.  Past Medical History  Diagnosis Date  . Left bundle branch block   . Asthma   . Thyroid disease   . Arthritis   . Pancreatitis, acute   . Angina effort   . Congestive dilated cardiomyopathy 03/31/2014  . LBBB (left bundle branch block) 10/30/2013   Past Surgical History  Procedure Laterality Date  . Hernia repair    . Abdominal hysterectomy    . Cholecystectomy    . Left heart catheterization with coronary angiogram N/A 11/02/2013    Procedure: LEFT HEART CATHETERIZATION WITH CORONARY ANGIOGRAM;  Surgeon: Corky Crafts, MD;  Location: [facility] CATH LAB;  Service: Cardiovascular;  Laterality: N/A;  . Loop recorder implant N/A 04/13/2014    Procedure: LOOP  RECORDER IMPLANT;  Surgeon: Duke Salvia, MD;  Location: [facility] CATH LAB;  Service: Cardiovascular;  Laterality: N/A;  . Ep implantable device N/A 07/04/2014    Procedure: BiV ICD Insertion CRT-D;  Surgeon: Duke Salvia, MD;  Location: [facility] INVASIVE CV LAB;  Service: Cardiovascular;  Laterality: N/A;  . Pacemaker insertion    . Cardiac defibrillator placement     Social History:  reports that she has never smoked. She does not have any smokeless tobacco history on file. She reports that she drinks alcohol. She reports that she does not use illicit drugs. Where does patient live home. Can patient participate in ADLs? Yes.  Allergies  Allergen Reactions  . Sulfa Antibiotics Hives  . Latex Itching    Family History:  Family History  Problem Relation Age of Onset  . Hyperlipidemia Mother   . Arrhythmia Mother     palpitations  . Heart disease Father       Prior to Admission medications   Medication Sig Start Date End Date Taking? Authorizing Provider  amoxicillin (AMOXIL) 500 MG capsule Take 2,000 mg by mouth once. Before dental appointments 09/29/13  Yes Historical Provider, MD  aspirin EC 81 MG tablet Take 1 tablet (81 mg total) by mouth daily. 10/29/13  Yes Lars Masson, MD  aspirin-acetaminophen-caffeine (EXCEDRIN MIGRAINE) [phone] MG per tablet Take 1 tablet by mouth every 6 (six) hours as needed for headache or migraine.   Yes Historical Provider, MD  beta carotene w/minerals (OCUVITE) tablet Take 1 tablet by mouth daily.   Yes Historical Provider, MD  betamethasone dipropionate 0.05 % lotion Apply 1 application  topically as needed (for toes).   Yes Historical Provider, MD  calcium carbonate (TUMS - DOSED IN MG ELEMENTAL CALCIUM) 500 MG chewable tablet Chew 1-4 tablets by mouth 3 (three) times daily as needed for indigestion or heartburn.   Yes Historical Provider, MD  carvedilol (COREG) 6.25 MG tablet Take 6.25 mg by mouth 2 (two) times daily. 03/31/14  Yes Historical Provider,  MD  Coenzyme Q10 (CO Q 10 PO) Take 1 tablet by mouth daily.   Yes Historical Provider, MD  lisinopril (PRINIVIL,ZESTRIL) 5 MG tablet Take 1 tablet (5 mg total) by mouth daily. 07/01/14  Yes Duke Salvia, MD  magnesium oxide (MAG-OX) 400 MG tablet Take 1 tablet (400 mg total) by mouth daily. 03/18/14  Yes Lars Masson, MD  nitroGLYCERIN (NITROSTAT) 0.4 MG SL tablet Place 1 tablet (0.4 mg total) under the tongue every 5 (five) minutes as needed for chest pain. 10/29/13  Yes Lars Masson, MD  ondansetron (ZOFRAN) 4 MG tablet Take 2 mg by mouth every 8 (eight) hours as needed for nausea or vomiting.    Yes Historical Provider, MD  PROAIR HFA 108 (90 BASE) MCG/ACT inhaler Inhale 1 puff into the lungs every 4 (four) hours as needed for wheezing or shortness of breath.  09/08/13  Yes Historical Provider, MD  spironolactone (ALDACTONE) 25 MG tablet Take 0.5 tablets (12.5 mg total) by mouth daily. Patient taking differently: Take 12.5 mg by mouth 3 (three) times a week.  04/11/14  Yes Duke Salvia, MD  SYNTHROID 100 MCG tablet Take 100 mcg by mouth daily. 08/27/13  Yes Historical Provider, MD  Vitamin D, Ergocalciferol, (DRISDOL) 50000 UNITS CAPS capsule Take 50,000 Units by mouth every 14 (fourteen) days.   Yes Historical Provider, MD  HYDROcodone-acetaminophen (NORCO/VICODIN) 5-325 MG per tablet Take 1 tablet by mouth 2 (two) times daily as needed for severe pain. 07/17/14   Tomasita Crumble, MD    Physical Exam: Filed Vitals:   07/17/14 0315 07/17/14 0330 07/17/14 0345 07/17/14 0420  BP: 112/93 126/93 132/59 129/90  Pulse: 74 75 74 86  Temp:    98.1 F (36.7 C)  TempSrc:    Oral  Resp: 13 19 19 18   Height:    4\' 11"  (1.499 m)  Weight:    71.396 kg (157 lb 6.4 oz)  SpO2: 99% 100% 99% 99%     General:  Moderately built and nourished.  Eyes: Anicteric no pallor.  ENT: No discharge from the ears eyes nose and mouth.  Neck: No mass felt.  Cardiovascular: S1 and S2 heard.  Respiratory: No  rhonchi or crepitations.  Abdomen: Soft nontender bowel sounds present.  Skin: Defibrillator site looks healed.  Musculoskeletal: No edema.  Psychiatric: Appears normal.  Neurologic: Alert awake oriented to time place and person. Moves all extremities.  Labs on Admission:  Basic Metabolic Panel:  Recent Labs Lab 07/17/14 0230  NA 141  K 3.9  CL 107  CO2 25  GLUCOSE 107*  BUN 7  CREATININE 0.91  CALCIUM 10.2  MG 1.7   Liver Function Tests: No results for input(s): AST, ALT, ALKPHOS, BILITOT, PROT, ALBUMIN in the last 168 hours. No results for input(s): LIPASE, AMYLASE in the last 168 hours. No results for input(s): AMMONIA in the last 168 hours. CBC:  Recent Labs Lab 07/17/14 0230  WBC 6.0  NEUTROABS 3.7  HGB 12.1  HCT 35.8*  MCV 89.3  PLT 156   Cardiac Enzymes: No results for input(s): CKTOTAL, CKMB,  CKMBINDEX, TROPONINI in the last 168 hours.  BNP (last 3 results)  Recent Labs  07/17/14 0230  BNP 619.8*    ProBNP (last 3 results) No results for input(s): PROBNP in the last 8760 hours.  CBG: No results for input(s): GLUCAP in the last 168 hours.  Radiological Exams on Admission: Dg Chest 2 View  07/17/2014   CLINICAL DATA:  Acute onset of generalized chest pain. Initial encounter.  EXAM: CHEST  2 VIEW  COMPARISON:  Chest radiograph performed 07/05/2014  FINDINGS: The lungs are well-aerated. Mild peribronchial thickening is noted. Mild left basilar atelectasis is seen. There is no evidence of pleural effusion or pneumothorax.  The heart is borderline normal in size. A pacemaker/AICD is noted at the left chest wall, with leads ending at the right atrium, right ventricle and coronary sinus. No acute osseous abnormalities are seen. Clips are noted within the right upper quadrant, reflecting prior cholecystectomy.  IMPRESSION: Mild peribronchial thickening noted. Mild left basilar atelectasis seen. Lungs otherwise clear.   Electronically Signed   By: Roanna Raider M.D.   On: 07/17/2014 02:53   Dg Lumbar Spine Complete  07/17/2014   CLINICAL DATA:  Acute onset of lower back pain, radiating to left hip. Initial encounter.  EXAM: LUMBAR SPINE - COMPLETE 4+ VIEW  COMPARISON:  CT of the abdomen and pelvis performed 11/13/2012  FINDINGS: There is no evidence of fracture or subluxation. Right convex lumbar scoliosis is noted, with mild underlying degenerative change. Vertebral bodies demonstrate normal height. Multilevel disc space narrowing is noted along the lumbar spine, with slight associated endplate sclerosis. Mild sclerosis is noted at the sacroiliac joints.  The visualized bowel gas pattern is unremarkable in appearance; air and stool are noted within the colon. The sacroiliac joints are within normal limits. Clips are noted within the right upper quadrant, reflecting prior cholecystectomy. AICD leads are partially imaged.  IMPRESSION: 1. No evidence of fracture or subluxation along the lumbar spine. 2. Right convex lumbar scoliosis, with mild underlying degenerative change.   Electronically Signed   By: Roanna Raider M.D.   On: 07/17/2014 02:10    EKG: Independently reviewed. Normal sinus rhythm with LBBB.  Assessment/Plan Active Problems:   Chronic systolic CHF (congestive heart failure), NYHA class 3   Chest pain   Back pain   Hypothyroidism   1. Chest pain with nausea - presently chest pain-free. We will cycle cardiac markers. Since patient also has nausea and previous history of pancreatitis we will check LFTs and lipase. Patient has been placed on clear liquid diet. Consult cardiology in a.m. 2. Low back pain with history of scoliosis - patient's pain is improved. I have consulted physical therapy. Closely observe. 3. Recent defibrillator placement for nonischemic cardiomyopathy - site looks healed. Patient appears compensated continue lisinopril and diuretics. 4. Hypothyroidism on Synthroid.   DVT Prophylaxis Lovenox.  Code Status: Full  code.  Family Communication: Discussed with patient.  Disposition Plan: Admit for observation.    Prima Rayner N. [facility] Pager [phone].  If 7PM-7AM, please contact night-coverage [email or web address] Password [email or web address] 07/17/2014, 6:05 AM

## 2014-07-17 NOTE — ED Notes (Addendum)
Pt arrives via [facility] for c/o back pain that radiates down to her left hip. Pt has cardiac hx, denies chest pain but states this is how her heart attack presented in the past. Had pacemaker/defib placed about 2 weeks ago. Pt reports some sob, hx of asthma and chf. Received 324mg  asa and 4mg  of zofran, 10mg  of morphine via ems. Alert, oriented.

## 2014-07-17 NOTE — ED Provider Notes (Signed)
CSN: [id number]     Arrival date & time 07/16/14  2352 History  This chart was scribed for [name] Crumble, MD by Evon Slack, ED Scribe. This patient was seen in room B16C/B16C and the patient's care was started at 12:26 AM.    Chief Complaint  Patient presents with  . Back Pain   Patient is a 79 y.o. female presenting with back pain. The history is provided by the patient. No language interpreter was used.  Back Pain Location:  Lumbar spine Radiates to:  L knee and L posterior upper leg Pain severity:  Moderate Duration:  1 day Timing:  Constant Context: not falling and not recent injury   Relieved by:  Nothing Worsened by:  Movement Associated symptoms: leg pain   Associated symptoms: no bladder incontinence, no bowel incontinence, no numbness and no weakness    HPI Comments: [name] [name] is a 79 y.o. female with PMHx of scoliosis brought in by ambulance, who presents to the Emergency Department complaining of constant back pain onset this morning. Pt states that the pain is radiating into her left hip down to her posterior knee. Pt denies injury or fall. Pt states that the only thing she could have possible done was turn the wrong way while walking. She states that the pain is worse with any movement. Pt states that she has tried Excedrin with no relief. Pt denies numbness, bowel/bladder incontinence. Denies Hx of back surgeries. She reports Hx of back pain that she gets cortisone injections for.   Past Medical History  Diagnosis Date  . Left bundle branch block   . Asthma   . Thyroid disease   . Arthritis   . Pancreatitis, acute   . Angina effort   . Congestive dilated cardiomyopathy 03/31/2014  . LBBB (left bundle branch block) 10/30/2013   Past Surgical History  Procedure Laterality Date  . Hernia repair    . Abdominal hysterectomy    . Cholecystectomy    . Left heart catheterization with coronary angiogram N/A 11/02/2013    Procedure: LEFT HEART CATHETERIZATION WITH  CORONARY ANGIOGRAM;  Surgeon: Corky Crafts, MD;  Location: [facility] CATH LAB;  Service: Cardiovascular;  Laterality: N/A;  . Loop recorder implant N/A 04/13/2014    Procedure: LOOP RECORDER IMPLANT;  Surgeon: Duke Salvia, MD;  Location[facility] CATH LAB;  Service: Cardiovascular;  Laterality: N/A;  . Ep implantable device N/A 07/04/2014    Procedure: BiV ICD Insertion CRT-D;  Surgeon: Duke Salvia, MD;  Location: [facility] INVASIVE CV LAB;  Service: Cardiovascular;  Laterality: N/A;  . Pacemaker insertion    . Cardiac defibrillator placement     Family History  Problem Relation Age of Onset  . Hyperlipidemia Mother   . Arrhythmia Mother     palpitations  . Heart disease Father    History  Substance Use Topics  . Smoking status: Never Smoker   . Smokeless tobacco: Not on file  . Alcohol Use: Yes     Comment: occassionally   OB History    No data available      Review of Systems  Gastrointestinal: Negative for bowel incontinence.  Genitourinary: Negative.  Negative for bladder incontinence.  Musculoskeletal: Positive for back pain.  Neurological: Negative for weakness and numbness.  All other systems reviewed and are negative.     Allergies  Sulfa antibiotics and Latex  Home Medications   Prior to Admission medications   Medication Sig Start Date End Date Taking? Authorizing Provider  amoxicillin (AMOXIL) 500 MG capsule Take 2,000 mg by mouth once. Before dental appointments 09/29/13  Yes Historical Provider, MD  aspirin EC 81 MG tablet Take 1 tablet (81 mg total) by mouth daily. 10/29/13  Yes Lars Masson, MD  aspirin-acetaminophen-caffeine (EXCEDRIN MIGRAINE) [phone] MG per tablet Take 1 tablet by mouth every 6 (six) hours as needed for headache or migraine.   Yes Historical Provider, MD  beta carotene w/minerals (OCUVITE) tablet Take 1 tablet by mouth daily.   Yes Historical Provider, MD  betamethasone dipropionate 0.05 % lotion Apply 1 application topically as needed  (for toes).   Yes Historical Provider, MD  calcium carbonate (TUMS - DOSED IN MG ELEMENTAL CALCIUM) 500 MG chewable tablet Chew 1-4 tablets by mouth 3 (three) times daily as needed for indigestion or heartburn.   Yes Historical Provider, MD  carvedilol (COREG) 6.25 MG tablet Take 6.25 mg by mouth 2 (two) times daily. 03/31/14  Yes Historical Provider, MD  Coenzyme Q10 (CO Q 10 PO) Take 1 tablet by mouth daily.   Yes Historical Provider, MD  lisinopril (PRINIVIL,ZESTRIL) 5 MG tablet Take 1 tablet (5 mg total) by mouth daily. 07/01/14  Yes Duke Salvia, MD  magnesium oxide (MAG-OX) 400 MG tablet Take 1 tablet (400 mg total) by mouth daily. 03/18/14  Yes Lars Masson, MD  nitroGLYCERIN (NITROSTAT) 0.4 MG SL tablet Place 1 tablet (0.4 mg total) under the tongue every 5 (five) minutes as needed for chest pain. 10/29/13  Yes Lars Masson, MD  ondansetron (ZOFRAN) 4 MG tablet Take 2 mg by mouth every 8 (eight) hours as needed for nausea or vomiting.    Yes Historical Provider, MD  PROAIR HFA 108 (90 BASE) MCG/ACT inhaler Inhale 1 puff into the lungs every 4 (four) hours as needed for wheezing or shortness of breath.  09/08/13  Yes Historical Provider, MD  spironolactone (ALDACTONE) 25 MG tablet Take 0.5 tablets (12.5 mg total) by mouth daily. Patient taking differently: Take 12.5 mg by mouth 3 (three) times a week.  04/11/14  Yes Duke Salvia, MD  SYNTHROID 100 MCG tablet Take 100 mcg by mouth daily. 08/27/13  Yes Historical Provider, MD  Vitamin D, Ergocalciferol, (DRISDOL) 50000 UNITS CAPS capsule Take 50,000 Units by mouth every 14 (fourteen) days.   Yes Historical Provider, MD   BP 112/78 mmHg  Pulse 73  Temp(Src) 97.8 F (36.6 C) (Oral)  Resp 18  SpO2 100%   Physical Exam  Constitutional: She is oriented to person, place, and time. She appears well-developed and well-nourished. No distress.  HENT:  Head: Normocephalic and atraumatic.  Nose: Nose normal.  Mouth/Throat: Oropharynx is  clear and moist. No oropharyngeal exudate.  Eyes: Conjunctivae and EOM are normal. Pupils are equal, round, and reactive to light. No scleral icterus.  Neck: Normal range of motion. Neck supple. No JVD present. No tracheal deviation present. No thyromegaly present.  Cardiovascular: Normal rate, regular rhythm and normal heart sounds.  Exam reveals no gallop and no friction rub.   No murmur heard. Pulmonary/Chest: Effort normal and breath sounds normal. No respiratory distress. She has no wheezes. She exhibits no tenderness.  Abdominal: Soft. Bowel sounds are normal. She exhibits no distension and no mass. There is no tenderness. There is no rebound and no guarding.  Musculoskeletal: Normal range of motion. She exhibits no edema or tenderness.  positive SLR test on left.   Lymphadenopathy:    She has no cervical adenopathy.  Neurological: She is  alert and oriented to person, place, and time. No cranial nerve deficit. She exhibits normal muscle tone.  Skin: Skin is warm and dry. No rash noted. No erythema. No pallor.  Nursing note and vitals reviewed.   ED Course  Procedures (including critical care time) DIAGNOSTIC STUDIES: Oxygen Saturation is 100% on RA, normal by my interpretation.    COORDINATION OF CARE: 12:49 AM-Discussed treatment plan with pt at bedside and pt agreed to plan.     Labs Review Labs Reviewed - No data to display  Imaging Review Dg Lumbar Spine Complete  07/17/2014   CLINICAL DATA:  Acute onset of lower back pain, radiating to left hip. Initial encounter.  EXAM: LUMBAR SPINE - COMPLETE 4+ VIEW  COMPARISON:  CT of the abdomen and pelvis performed 11/13/2012  FINDINGS: There is no evidence of fracture or subluxation. Right convex lumbar scoliosis is noted, with mild underlying degenerative change. Vertebral bodies demonstrate normal height. Multilevel disc space narrowing is noted along the lumbar spine, with slight associated endplate sclerosis. Mild sclerosis is  noted at the sacroiliac joints.  The visualized bowel gas pattern is unremarkable in appearance; air and stool are noted within the colon. The sacroiliac joints are within normal limits. Clips are noted within the right upper quadrant, reflecting prior cholecystectomy. AICD leads are partially imaged.  IMPRESSION: 1. No evidence of fracture or subluxation along the lumbar spine. 2. Right convex lumbar scoliosis, with mild underlying degenerative change.   Electronically Signed   By: Roanna Raider M.D.   On: 07/17/2014 02:10     EKG Interpretation   Date/Time:  Sunday July 17 2014 00:01:47 EDT Ventricular Rate:  88 PR Interval:  188 QRS Duration: 137 QT Interval:  436 QTC Calculation: 528 R Axis:   -75 Text Interpretation:  Sinus rhythm Probable left atrial enlargement Left  bundle branch block No significant change since last tracing Confirmed by  Erroll Luna [id number]) on 07/17/2014 12:24:52 AM      MDM   Final diagnoses:  Back pain    Patient presents to the ED for back pain. This is chronic for her after she does have exacerbations. She describes the pain as sharp and radiating to her posterior knee. She denies any recent trauma. Her neurological exam today is normal. X-ray does not show any injuries or metastatic disease. Patient was treated with Toradol and Norco in emergency department. She'll be discharged with a prescription and advised to follow-up with her primary care physician. Triage notes history of asthma and CHF in reporting shortness of breath. Patient did not express this to me. However I was called into the room as the patient is having acute chest pain. During this episode she describes as indigestion and pressure in her mid sternum. She had associated diaphoresis shortness of breath and nausea. She states her back pain is completely resolved after medications. Patient did have recent pacemaker placed and she is a high risk cardiac patient. Will admit to [facility] for further care. EKG shows T-wave inversions, troponin is negative.   I personally performed the services described in this documentation, which was scribed in my presence. The recorded information has been reviewed and is accurate.    [name] Crumble, MD 07/17/14 [phone]

## 2014-07-17 NOTE — ED Notes (Signed)
Patient transported to X-ray 

## 2014-07-17 NOTE — ED Notes (Signed)
Pt reports increasing sharp pain in left hip with movement. Continues to deny chest pain.

## 2014-07-18 ENCOUNTER — Ambulatory Visit: Payer: Medicare Other

## 2014-07-18 DIAGNOSIS — R112 Nausea with vomiting, unspecified: Secondary | ICD-10-CM | POA: Diagnosis not present

## 2014-07-18 DIAGNOSIS — M545 Low back pain: Secondary | ICD-10-CM | POA: Diagnosis not present

## 2014-07-18 LAB — COMPREHENSIVE METABOLIC PANEL
ALBUMIN: 3.5 g/dL (ref 3.5–5.0)
ALT: 24 U/L (ref 14–54)
AST: 32 U/L (ref 15–41)
Alkaline Phosphatase: 77 U/L (ref 38–126)
Anion gap: 5 (ref 5–15)
BILIRUBIN TOTAL: 0.7 mg/dL (ref 0.3–1.2)
BUN: 10 mg/dL (ref 6–20)
CO2: 27 mmol/L (ref 22–32)
Calcium: 9.4 mg/dL (ref 8.9–10.3)
Chloride: 105 mmol/L (ref 101–111)
Creatinine, Ser: 1.05 mg/dL — ABNORMAL HIGH (ref 0.44–1.00)
GFR calc Af Amer: 58 mL/min — ABNORMAL LOW (ref >60)
GFR, EST NON AFRICAN AMERICAN: 50 mL/min — AB (ref >60)
Glucose, Bld: 110 mg/dL — ABNORMAL HIGH (ref 65–99)
POTASSIUM: 4.8 mmol/L (ref 3.5–5.1)
Sodium: 137 mmol/L (ref 135–145)
TOTAL PROTEIN: 5.8 g/dL — AB (ref 6.5–8.1)

## 2014-07-18 LAB — LIPASE, BLOOD: Lipase: 22 U/L (ref 22–51)

## 2014-07-18 MED ORDER — SORBITOL 70 % SOLN
40.0000 mL | Freq: Once | Status: AC
  Administered 2014-07-18: 30 mL via ORAL
  Filled 2014-07-18: qty 60

## 2014-07-18 MED ORDER — HYDROCODONE-ACETAMINOPHEN 5-325 MG PO TABS: 1.0000 | ORAL_TABLET | Freq: Two times a day (BID) | ORAL | Status: DC | PRN

## 2014-07-18 MED ORDER — HYDROCODONE-ACETAMINOPHEN 5-325 MG PO TABS
1.0000 | ORAL_TABLET | ORAL | Status: DC | PRN
  Administered 2014-07-18: 1 via ORAL
  Filled 2014-07-18: qty 1

## 2014-07-18 NOTE — Discharge Summary (Signed)
Physician Discharge Summary  [name] [name] KTG:[id number] DOB: [birth year] DOA: 07/16/2014  PCP: [name], MD  Admit date: 07/16/2014 Discharge date: 07/18/2014  Time spent: 35 minutes  Recommendations for Outpatient Follow-up:  1. Needs CMET as well as Mag and CBc in 1 week 2. Needs OP Ortho follow up for consideration of Hip injections 3. Consider bereavement counseling as patient lost her son on the day of hospital discharge 4. Recommend close follow-up with cardiology 5. although it was unlikely, patient may require ultrasound abdomen pelvis if her abdominal symptoms recur-she was not felt to have an active pancreatitis at the end of hospital stay rather the lipase was nonspecific because of nausea vomiting from being given Toradol in the emergency room  6. Needs TSH in about 6 weeks  Discharge Diagnoses:  Principal Problem:   Drug-induced nausea and vomiting Active Problems:   LBBB (left bundle branch block)   Chronic systolic CHF (congestive heart failure), NYHA class 3   Chest pain   Back pain   Hypothyroidism   Prinzmetal variant angina   Discharge Condition:  Fair   dietary recommendation:   none   Filed Weights   07/17/14 0001 07/17/14 0420 07/18/14 0527  Weight: 74.844 kg (165 lb) 71.396 kg (157 lb 6.4 oz) 71.759 kg (158 lb 3.2 oz)    History of present illness:  79 y/o NICM s/p Biotronik recently AICD/DEfib, Chr LBBB piror to an elective knee surgery-s/p neg Lexiscan at [facility] has been managed since 2015 by Dr. Delton See for Angina -Had a negative cardiac catheterization 12/2013 however echocardiogram = 30% Has had Prinzmetal angina And is classified as NYHA class III Cardiac MRI 03/17/14 showed LVEF 37%  She was referred to electrophysiology and as medical management options had been depleted for NYHA class III and had a wide QRS had the pacemaker placed  She presented to emergency room primarily with low back pain. She has a  history of having epidural start injections for this which is chronic. In the emergency room she received a cocktail of medications including Toradol and Norco in the emergency room and was about to be discharged when she started having indigestion and midsternal pressure with associated diaphoresis EKG on admission showed chronic left bundle branch block without inversions and troponins so far have been negative  BNP was slightly elevated at 619 Lipase was 80 Hemoglobin 12 WBC 7.5  Hospital Course:  Principal Problem:  Drug-induced vs mild pancreatitis associated with nausea and vomiting -Symptomatology likely explainable by medications. Was given a clear liquid diet and tolerated a regular diet subsequently -Lipase is 80 is a sensitive indicator for pancreatitishowever the patient's lipase dropped and she had no further LFT excursions so was felt that these were nonspecific. -Patient has a history of a bad gallbladder surgery about 2006 and if she has recurring symptoms I would recommend that she have an ultrasound abdomen pelvis rule out common bile  duct stone Active Problems:  Prinzmetal variant angina- -She is on maximal medical therapy including Coreg 6.25 twice a day, lisinopril 5, Aldactone 12.5 3 times a week -I did cc cardiologist Dr. Delton See on her discharge note  LBBB (left bundle branch block), status post Biotronik AICDplaced recently6/28/16 -Pacemaker was interrogated remotely 7/9 and was intact -She is pacing fine on the monitors -Dr. Ladona Ridgel of electrophysiology discussed her case with me and unless there are any further findings, she may be okay to discharge home if troponins and symptomatology resolved  Chronic systolic CHF (  congestive heart failure), NYHA class 3 -Continue Aldactone 12.5as above  Back painsecondary to dextroscoliosis -Chronic-will need outpatient follow-up -I will cc Dr. Charlann Boxer her orthopedic surgeon to coordinate epidural started injections -she had  her last injection in April  Hypothyroidism -Continue Synthroid 100 g daily -Needs TSH 2-4 weeks  Hyperlipidemia -Patient on red rice yeast per cardiology -Active ingredient is lovastatin--> it may be reasonable to substitute lovastatin for reck rice yeast -No current evidence for replacement of coenzyme Q for statin myopathy but I will defer this once again to cardiologist  Headaches -Excedrin Migraine on hold -Cautioned patient on discharge regarding the same in terms of excessive aspirin use  Discharge Exam: Filed Vitals:   07/18/14 0800  BP: 126/69  Pulse: 66  Temp: 98.7 F (37.1 C)  Resp:     General: EOMI NCAT Cardiovascular: S1-S2 no murmur rub or gallop Respiratory: clinically clear no added sound  Discharge Instructions   Discharge Instructions    Diet - low sodium heart healthy    Complete by:  As directed      Discharge instructions    Complete by:  As directed   Recommend careful use of Aspirin containing products Please call Dr. Charlann Boxer for further Hip injectiosn-We have given you some tablets of norco which should be refilled by primary MD Please visit Dr. Delton See for further Heart care non-emergently Get lab work at primary MD office Good luck and take care     Increase activity slowly    Complete by:  As directed           Current Discharge Medication List    START taking these medications   Details  HYDROcodone-acetaminophen (NORCO/VICODIN) 5-325 MG per tablet Take 1 tablet by mouth 2 (two) times daily as needed for severe pain. Qty: 16 tablet, Refills: 0      CONTINUE these medications which have NOT CHANGED   Details  aspirin EC 81 MG tablet Take 1 tablet (81 mg total) by mouth daily. Qty: 90 tablet, Refills: 3   Associated Diagnoses: Typical angina; Pre-procedure lab exam    aspirin-acetaminophen-caffeine (EXCEDRIN MIGRAINE) 250-250-65 MG per tablet Take 1 tablet by mouth every 6 (six) hours as needed for headache or migraine.     betamethasone dipropionate 0.05 % lotion Apply 1 application topically as needed (for toes).    calcium carbonate (TUMS - DOSED IN MG ELEMENTAL CALCIUM) 500 MG chewable tablet Chew 1-4 tablets by mouth 3 (three) times daily as needed for indigestion or heartburn.    carvedilol (COREG) 6.25 MG tablet Take 6.25 mg by mouth 2 (two) times daily.    Coenzyme Q10 (CO Q 10 PO) Take 1 tablet by mouth daily.    lisinopril (PRINIVIL,ZESTRIL) 5 MG tablet Take 1 tablet (5 mg total) by mouth daily. Qty: 90 tablet, Refills: 3    magnesium oxide (MAG-OX) 400 MG tablet Take 1 tablet (400 mg total) by mouth daily.    ondansetron (ZOFRAN) 4 MG tablet Take 2 mg by mouth every 8 (eight) hours as needed for nausea or vomiting.     PROAIR HFA 108 (90 BASE) MCG/ACT inhaler Inhale 1 puff into the lungs every 4 (four) hours as needed for wheezing or shortness of breath.     spironolactone (ALDACTONE) 25 MG tablet Take 0.5 tablets (12.5 mg total) by mouth daily. Qty: 15 tablet, Refills: 3    SYNTHROID 100 MCG tablet Take 100 mcg by mouth daily.    Vitamin D, Ergocalciferol, (DRISDOL) 50000 UNITS CAPS  capsule Take 50,000 Units by mouth every 14 (fourteen) days.      STOP taking these medications     amoxicillin (AMOXIL) 500 MG capsule      beta carotene w/minerals (OCUVITE) tablet      nitroGLYCERIN (NITROSTAT) 0.4 MG SL tablet        Allergies  Allergen Reactions  . Sulfa Antibiotics Hives  . Latex Itching   Follow-up Information    Follow up with [name], MD. Schedule an appointment as soon as possible for a visit in 3 days.   Specialty:  Family Medicine   Why:  for close follow up of your back pain   Contact information:   [address] [place] [address] [phone]        The results of significant diagnostics from this hospitalization (including imaging, microbiology, ancillary and laboratory) are listed below for reference.    Significant Diagnostic  Studies: Dg Chest 2 View  07/17/2014   CLINICAL DATA:  Acute onset of generalized chest pain. Initial encounter.  EXAM: CHEST  2 VIEW  COMPARISON:  Chest radiograph performed 07/05/2014  FINDINGS: The lungs are well-aerated. Mild peribronchial thickening is noted. Mild left basilar atelectasis is seen. There is no evidence of pleural effusion or pneumothorax.  The heart is borderline normal in size. A pacemaker/AICD is noted at the left chest wall, with leads ending at the right atrium, right ventricle and coronary sinus. No acute osseous abnormalities are seen. Clips are noted within the right upper quadrant, reflecting prior cholecystectomy.  IMPRESSION: Mild peribronchial thickening noted. Mild left basilar atelectasis seen. Lungs otherwise clear.   Electronically Signed   By: Roanna Raider M.D.   On: 07/17/2014 02:53   Dg Chest 2 View  07/05/2014   CLINICAL DATA:  Shortness since pacemaker placement, cough and shortness breath  EXAM: CHEST  2 VIEW  COMPARISON:  Chest x-ray of October 11, 2013  FINDINGS: Since the previous study a permanent pacemaker defibrillator has been placed. Positioning is appropriate radiographically. The cardiac silhouette is top-normal in size. The pulmonary vascularity is normal. The lungs are adequately inflated and clear. There is no pleural effusion or pneumothorax. There is tortuosity of the descending thoracic aorta. There is gentle dextrocurvature of the lower lumbar spine. The bony structures exhibit no acute abnormalities.  IMPRESSION: There is no pneumonia, CHF, nor other acute cardiopulmonary abnormality.   Electronically Signed   By: David  [place] M.D.   On: 07/05/2014 07:10   Dg Lumbar Spine Complete  07/17/2014   CLINICAL DATA:  Acute onset of lower back pain, radiating to left hip. Initial encounter.  EXAM: LUMBAR SPINE - COMPLETE 4+ VIEW  COMPARISON:  CT of the abdomen and pelvis performed 11/13/2012  FINDINGS: There is no evidence of fracture or subluxation.  Right convex lumbar scoliosis is noted, with mild underlying degenerative change. Vertebral bodies demonstrate normal height. Multilevel disc space narrowing is noted along the lumbar spine, with slight associated endplate sclerosis. Mild sclerosis is noted at the sacroiliac joints.  The visualized bowel gas pattern is unremarkable in appearance; air and stool are noted within the colon. The sacroiliac joints are within normal limits. Clips are noted within the right upper quadrant, reflecting prior cholecystectomy. AICD leads are partially imaged.  IMPRESSION: 1. No evidence of fracture or subluxation along the lumbar spine. 2. Right convex lumbar scoliosis, with mild underlying degenerative change.   Electronically Signed   By: Roanna Raider M.D.   On: 07/17/2014 02:10  Microbiology: No results found for this or any previous visit (from the past 240 hour(s)).   Labs: Basic Metabolic Panel:  Recent Labs Lab 07/17/14 0230 07/17/14 0710 07/17/14 1841 07/18/14 0826  NA 141 140 137 137  K 3.9 4.3 4.0 4.8  CL 107 108 104 105  CO2 GLUCOSE 107* 121* 108* 110*  BUN CREATININE 0.91 0.98 1.08* 1.05*  CALCIUM 10.2 9.8 9.2 9.4  MG 1.7  --   --   --    Liver Function Tests:  Recent Labs Lab 07/17/14 0710 07/17/14 1841 07/18/14 0826  AST 76* 44* 32  ALT ALKPHOS 73 76 77  BILITOT 0.6 0.9 0.7  PROT 6.3* 6.3* 5.8*  ALBUMIN 3.5 3.4* 3.5    Recent Labs Lab 07/17/14 0710 07/17/14 1841 07/18/14 0826  LIPASE 80* 22 22   No results for input(s): AMMONIA in the last 168 hours. CBC:  Recent Labs Lab 07/17/14 0230 07/17/14 0710  WBC 6.0 7.5  NEUTROABS 3.7 5.5  HGB 12.1 12.1  HCT 35.8* 35.9*  MCV 89.3 90.0  PLT 156 159   Cardiac Enzymes:  Recent Labs Lab 07/17/14 0710 07/17/14 1150 07/17/14 1841  TROPONINI <0.03 <0.03 <0.03   BNP: BNP (last 3 results)  Recent Labs  07/17/14 0230  BNP 619.8*    ProBNP (last 3 results) No  results for input(s): PROBNP in the last 8760 hours.  CBG: No results for input(s): GLUCAP in the last 168 hours.     SignedRhetta Mura  [facility] 07/18/2014, 2:25 PM

## 2014-07-18 NOTE — Progress Notes (Signed)
Pt discharge education and instructions completed with pt and spouse at bedside; both voices understanding and denies any questions. Pt IV and telemetry removed; pt handed her prescription for Vicodin; pt discharge home with spouse to transport her home. Pt transported off unit via wheelchair with belongings to the side. Arabella Merles Satara Virella RN.

## 2014-07-18 NOTE — Progress Notes (Signed)
   07/18/14 1300  Clinical Encounter Type  Visited With Patient and family together  Visit Type Initial;Spiritual support;Social support  Spiritual Encounters  Spiritual Needs Prayer;Grief support  Stress Factors  Patient Stress Factors Loss  Family Stress Factors Loss   Chaplain was paged to patient's room at 1:00 PM. When chaplain arrived, patient was being visited by her husband. Patient and patient's husband had just received news that their son had died due to a drug overdose. Family was in grief but were not surprised by the news. Son's drug abuse has left him estranged from his parents for years. Patient asked for prayer and chaplain prayed with the patient and her husband. Patient and patient's husband plan on going on with life as usual and focusing on the patient's health, while they let the patient's siblings and children organize the funeral. Lunette Stands will continue to provide emotional and spiritual support for patient and patient's family as needed.  Lucilla Petrenko, Tommi Emery, Chaplain  1:39 PM

## 2014-07-19 ENCOUNTER — Encounter: Payer: Self-pay | Admitting: Internal Medicine

## 2014-07-21 ENCOUNTER — Encounter: Payer: Self-pay | Admitting: Internal Medicine

## 2014-07-21 ENCOUNTER — Other Ambulatory Visit: Payer: Self-pay | Admitting: Family Medicine

## 2014-07-21 DIAGNOSIS — K319 Disease of stomach and duodenum, unspecified: Secondary | ICD-10-CM

## 2014-07-21 DIAGNOSIS — R1013 Epigastric pain: Principal | ICD-10-CM

## 2014-07-25 ENCOUNTER — Telehealth: Payer: Self-pay | Admitting: Internal Medicine

## 2014-07-25 NOTE — Telephone Encounter (Signed)
Patient scheduled for tomorrow 07-26-14 at 10am for pacer/wound check.

## 2014-07-25 NOTE — Telephone Encounter (Signed)
New message       Pt was due to have a wound check 07-18-14.  She was in the hosp.  She received a letter that she missed the appt.  Pt states wound was checked while in hosp.  Please call and let her know if she needs to resc since it was checked in hosp

## 2014-07-26 ENCOUNTER — Ambulatory Visit (INDEPENDENT_AMBULATORY_CARE_PROVIDER_SITE_OTHER): Payer: Medicare Other | Admitting: *Deleted

## 2014-07-26 ENCOUNTER — Encounter: Payer: Self-pay | Admitting: Internal Medicine

## 2014-07-26 ENCOUNTER — Telehealth: Payer: Self-pay | Admitting: *Deleted

## 2014-07-26 ENCOUNTER — Ambulatory Visit
Admission: RE | Admit: 2014-07-26 | Discharge: 2014-07-26 | Disposition: A | Discharge status: Home / Self Care | Payer: Medicare Other | Source: Ambulatory Visit | Attending: Family Medicine | Admitting: Family Medicine

## 2014-07-26 DIAGNOSIS — I472 Ventricular tachycardia: Secondary | ICD-10-CM | POA: Diagnosis not present

## 2014-07-26 DIAGNOSIS — I4729 Other ventricular tachycardia: Secondary | ICD-10-CM

## 2014-07-26 DIAGNOSIS — I5022 Chronic systolic (congestive) heart failure: Secondary | ICD-10-CM | POA: Diagnosis not present

## 2014-07-26 DIAGNOSIS — K319 Disease of stomach and duodenum, unspecified: Secondary | ICD-10-CM

## 2014-07-26 DIAGNOSIS — R1013 Epigastric pain: Principal | ICD-10-CM

## 2014-07-26 DIAGNOSIS — I420 Dilated cardiomyopathy: Secondary | ICD-10-CM | POA: Diagnosis not present

## 2014-07-26 DIAGNOSIS — I447 Left bundle-branch block, unspecified: Secondary | ICD-10-CM

## 2014-07-26 LAB — CUP PACEART INCLINIC DEVICE CHECK
Brady Statistic RA Percent Paced: 1 %
Brady Statistic RV Percent Paced: 92 %
HIGH POWER IMPEDANCE MEASURED VALUE: 70 Ohm
Lead Channel Impedance Value: 623 Ohm
Lead Channel Impedance Value: 623 Ohm
Lead Channel Impedance Value: 623 Ohm
Lead Channel Pacing Threshold Amplitude: 0.5 V
Lead Channel Pacing Threshold Amplitude: 1 V
Lead Channel Pacing Threshold Pulse Width: 0.4 ms
Lead Channel Sensing Intrinsic Amplitude: 1.5 mV
Lead Channel Setting Pacing Amplitude: 3 V
Lead Channel Setting Pacing Amplitude: 3 V
Lead Channel Setting Pacing Amplitude: 3 V
Lead Channel Setting Pacing Pulse Width: 0.4 ms
Lead Channel Setting Pacing Pulse Width: 0.4 ms
MDC IDC MSMT BATTERY REMAINING PERCENTAGE: 100 %
MDC IDC MSMT BATTERY VOLTAGE: 3.17 V
MDC IDC MSMT LEADCHNL LV SENSING INTR AMPL: 24.4 mV
MDC IDC MSMT LEADCHNL RA PACING THRESHOLD AMPLITUDE: 0.6 V
MDC IDC MSMT LEADCHNL RA PACING THRESHOLD PULSEWIDTH: 0.4 ms
MDC IDC MSMT LEADCHNL RV PACING THRESHOLD PULSEWIDTH: 0.4 ms
MDC IDC MSMT LEADCHNL RV SENSING INTR AMPL: 24.2 mV
MDC IDC SESS DTM: 20160719105615
Pulse Gen Serial Number: 60863705

## 2014-07-26 NOTE — Progress Notes (Signed)
Wound check appointment. Stitch removed from the R corner of incision. Wound without redness or edema. Incision edges approximated, wound well healed. Normal device function. Thresholds, sensing, and impedances consistent with implant measurements. Device programmed at 3.0V for extra safety margin until 3 month visit. Histogram distribution appropriate for patient and level of activity. No mode switches or ventricular arrhythmias noted. Patient educated about wound care, arm mobility, and lifting restrictions. ROV in 3 months with Dr.Klein.

## 2014-07-26 NOTE — Telephone Encounter (Signed)
LMTCB//sss 

## 2014-07-29 ENCOUNTER — Encounter: Payer: Self-pay | Admitting: Internal Medicine

## 2014-08-01 ENCOUNTER — Encounter: Payer: Self-pay | Admitting: Internal Medicine

## 2014-08-02 ENCOUNTER — Encounter: Payer: Self-pay | Admitting: Internal Medicine

## 2014-08-08 ENCOUNTER — Encounter: Payer: Self-pay | Admitting: Internal Medicine

## 2014-08-09 ENCOUNTER — Ambulatory Visit: Payer: Medicare Other | Admitting: Cardiology

## 2014-08-11 ENCOUNTER — Telehealth: Payer: Self-pay | Admitting: Cardiology

## 2014-08-11 NOTE — Telephone Encounter (Signed)
Pt calling to confirm if I would be in the office tomorrow when she comes to see Dr Delton See.  Pt has a gift to bring.  Informed the pt that I would be here.

## 2014-08-11 NOTE — Telephone Encounter (Signed)
New message      Talk to Lajoyce Corners at your convenience.  Pt knows about her appt tomorrow but still need to talk to [facility].  She would not tell me what she wanted

## 2014-08-12 ENCOUNTER — Ambulatory Visit (INDEPENDENT_AMBULATORY_CARE_PROVIDER_SITE_OTHER): Payer: Medicare Other | Admitting: Cardiology

## 2014-08-12 ENCOUNTER — Encounter: Payer: Self-pay | Admitting: Cardiology

## 2014-08-12 VITALS — BP 130/62 | HR 72 | Ht 59.0 in | Wt 159.0 lb

## 2014-08-12 DIAGNOSIS — I5022 Chronic systolic (congestive) heart failure: Secondary | ICD-10-CM

## 2014-08-12 DIAGNOSIS — I10 Essential (primary) hypertension: Secondary | ICD-10-CM | POA: Diagnosis not present

## 2014-08-12 DIAGNOSIS — I472 Ventricular tachycardia: Secondary | ICD-10-CM | POA: Diagnosis not present

## 2014-08-12 DIAGNOSIS — Z95810 Presence of automatic (implantable) cardiac defibrillator: Secondary | ICD-10-CM

## 2014-08-12 DIAGNOSIS — I420 Dilated cardiomyopathy: Secondary | ICD-10-CM | POA: Diagnosis not present

## 2014-08-12 DIAGNOSIS — I4729 Other ventricular tachycardia: Secondary | ICD-10-CM

## 2014-08-12 NOTE — Progress Notes (Signed)
Patient ID: [name] [name], female   DOB: [birth year], 79 y.o.   MRN: [id number]      Patient Care Team: [name], MD as PCP - General (Family Medicine)  HPI  [name] [name] is a 79 y.o. female with history of arthritis and asthma. She was incidentally found to have LBBB prior to the knee surgery and underwent a Lexiscan nuclear stress in [place] test that was negative. She then developed progressively worsening chest pain and on 10/11/2013 she went to the ER for chest pain that woke her from sleep. ACS was ruled out and she was discharged home.  She continued to have exertional dyspnea and in December 2015 underwent a cardiac cath that was normal. Her echo showed LVEF 30-35% and she was started on Lisinopril 5 mg po daily. She has been experiencing chest pain radiating to her neck and shoulder couple of mornings, resolved by sl NTG. Her daughter has Prinzmetal angina and she has h/o "tetanus like spasm" about 3 years ago, per patient, she had to immobilized in order to prevent bone breaks.  She underwent a BIV ICD implantation on 07/04/2014 as her LVEF worsened to 25-30% despite optimal therapy and her symptoms were NYHA class III. She also had episodes of nsVT of link monitor. She has since then improved, now class IIb.Link was removed at the time of BiV ICD placement. She was hospitalized in July for an episode of nausea and vomiting with elevated lipise. This is now resolved.  She feels overall better, improved DOE, CP, some palpitations with no dizziness and no syncope. No LE edema, no orthopnea or PND.  Past Medical History  Diagnosis Date  . Left bundle branch block   . Asthma   . Thyroid disease   . Arthritis   . Pancreatitis, acute   . Angina effort   . Congestive dilated cardiomyopathy 03/31/2014  . LBBB (left bundle branch block) 10/30/2013   Past Surgical History  Procedure Laterality Date  . Hernia repair    . Abdominal hysterectomy    . Cholecystectomy    .  Left heart catheterization with coronary angiogram N/A 11/02/2013    Procedure: LEFT HEART CATHETERIZATION WITH CORONARY ANGIOGRAM;  Surgeon: Corky Crafts, MD;  Location: [facility] CATH LAB;  Service: Cardiovascular;  Laterality: N/A;  . Loop recorder implant N/A 04/13/2014    Procedure: LOOP RECORDER IMPLANT;  Surgeon: Duke Salvia, MD;  Location: [facility] CATH LAB;  Service: Cardiovascular;  Laterality: N/A;  . Ep implantable device N/A 07/04/2014    Procedure: BiV ICD Insertion CRT-D;  Surgeon: Duke Salvia, MD;  Location: [facility] INVASIVE CV LAB;  Service: Cardiovascular;  Laterality: N/A;  . Pacemaker insertion    . Cardiac defibrillator placement     Current Outpatient Prescriptions  Medication Sig Dispense Refill  . aspirin EC 81 MG tablet Take 1 tablet (81 mg total) by mouth daily. 90 tablet 3  . aspirin-acetaminophen-caffeine (EXCEDRIN MIGRAINE) 250-250-65 MG per tablet Take 1 tablet by mouth every 6 (six) hours as needed for headache or migraine.    . betamethasone dipropionate 0.05 % lotion Apply 1 application topically as needed (for toes).    . calcium carbonate (TUMS - DOSED IN MG ELEMENTAL CALCIUM) 500 MG chewable tablet Chew 1-4 tablets by mouth 3 (three) times daily as needed for indigestion or heartburn.    . carvedilol (COREG) 6.25 MG tablet Take 6.25 mg by mouth 2 (two) times daily.    . Coenzyme Q10 (CO Q  10 PO) Take 1 tablet by mouth daily.    [name] HYDROcodone-acetaminophen (NORCO/VICODIN) 5-325 MG per tablet Take 1 tablet by mouth 2 (two) times daily as needed for severe pain. 16 tablet 0  . lisinopril (PRINIVIL,ZESTRIL) 5 MG tablet Take 1 tablet (5 mg total) by mouth daily. 90 tablet 3  . magnesium oxide (MAG-OX) 400 MG tablet Take 1 tablet (400 mg total) by mouth daily.    . ondansetron (ZOFRAN) 4 MG tablet Take 2 mg by mouth every 8 (eight) hours as needed for nausea or vomiting.     [name] PROAIR HFA 108 (90 BASE) MCG/ACT inhaler Inhale 1 puff into the lungs every 4 (four) hours as  needed for wheezing or shortness of breath.     . spironolactone (ALDACTONE) 25 MG tablet Take 0.5 tablets (12.5 mg total) by mouth daily. (Patient taking differently: Take 12.5 mg by mouth 3 (three) times a week. ) 15 tablet 3  . SYNTHROID 100 MCG tablet Take 100 mcg by mouth daily.    . Vitamin D, Ergocalciferol, (DRISDOL) 50000 UNITS CAPS capsule Take 50,000 Units by mouth every 14 (fourteen) days.     No current facility-administered medications for this visit.    Allergies  Allergen Reactions  . Sulfa Antibiotics Hives  . Latex Itching   Review of Systems negative except from HPI and PMH  Physical Exam BP 130/62 mmHg  Pulse 72  Ht  (1.499 m)  Wt 159 lb (72.122 kg)  BMI 32.10 kg/m2  SpO2 94% Well developed and well nourished in no acute distress HENT normal E scleral and icterus clear Neck Supple JVP 6-7; carotids brisk and full Clear to ausculation  Regular rate and rhythm, no murmurs gallops or rub Soft with active bowel sounds No clubbing cyanosis   Trace edema  Alert and oriented, grossly normal motor and sensory function Skin Warm and Dry   ECG demonstrates sinus rhythm at 77 Intervals 12/24/43 Left bundle branch block    Assessment and  Plan  Nonischemic cardiomyopathy Left bundle branch block Tachycardia Symptomatic PVCs  1. Congestive heart failure-chronic-systolic - improved symptoms now class IIb. Euvolemic. - continue lisinopril 5 mg po daily, carvedilol 6.25 mg po BID, spironolactone 25 mg po daily. - encouraged to start walking regularly  2. Non-ischemic cardiomyopathy, with LBBB, today's ECG shows narrow QRS with BiV pacing  - repeat echo in 1 month  3. HTN - controlled  Follow up in 2 months.   Lars Masson 08/12/2014

## 2014-08-12 NOTE — Patient Instructions (Signed)
Medication Instructions:   Your physician recommends that you continue on your current medications as directed. Please refer to the Current Medication list given to you today.     Testing/Procedures:  Your physician has requested that you have an echocardiogram. Echocardiography is a painless test that uses sound waves to create images of your heart. It provides your doctor with information about the size and shape of your heart and how well your heart's chambers and valves are working. This procedure takes approximately one hour. There are no restrictions for this procedure.  PER DR NELSON THIS NEEDS TO BE SCHEDULED FOR ONE MONTH OUT      Follow-Up:  2 MONTHS WITH DR Delton See

## 2014-08-16 ENCOUNTER — Encounter: Payer: Self-pay | Admitting: Internal Medicine

## 2014-08-17 ENCOUNTER — Encounter: Payer: Self-pay | Admitting: Internal Medicine

## 2014-08-17 ENCOUNTER — Other Ambulatory Visit: Payer: Self-pay | Admitting: Gastroenterology

## 2014-09-13 ENCOUNTER — Other Ambulatory Visit: Payer: Self-pay

## 2014-09-13 ENCOUNTER — Ambulatory Visit (HOSPITAL_COMMUNITY): Payer: Medicare Other | Attending: Cardiology

## 2014-09-13 DIAGNOSIS — I4729 Other ventricular tachycardia: Secondary | ICD-10-CM

## 2014-09-13 DIAGNOSIS — I071 Rheumatic tricuspid insufficiency: Secondary | ICD-10-CM | POA: Insufficient documentation

## 2014-09-13 DIAGNOSIS — I5189 Other ill-defined heart diseases: Secondary | ICD-10-CM | POA: Insufficient documentation

## 2014-09-13 DIAGNOSIS — I352 Nonrheumatic aortic (valve) stenosis with insufficiency: Secondary | ICD-10-CM | POA: Insufficient documentation

## 2014-09-13 DIAGNOSIS — I472 Ventricular tachycardia: Secondary | ICD-10-CM

## 2014-09-13 DIAGNOSIS — I509 Heart failure, unspecified: Secondary | ICD-10-CM | POA: Diagnosis present

## 2014-09-13 DIAGNOSIS — I5022 Chronic systolic (congestive) heart failure: Secondary | ICD-10-CM

## 2014-09-13 DIAGNOSIS — R002 Palpitations: Secondary | ICD-10-CM | POA: Diagnosis not present

## 2014-09-13 DIAGNOSIS — I420 Dilated cardiomyopathy: Secondary | ICD-10-CM

## 2014-10-03 ENCOUNTER — Ambulatory Visit (HOSPITAL_COMMUNITY): Admission: RE | Admit: 2014-10-03 | Payer: Medicare Other | Source: Ambulatory Visit | Admitting: Gastroenterology

## 2014-10-03 ENCOUNTER — Encounter (HOSPITAL_COMMUNITY): Admission: RE | Payer: Self-pay | Source: Ambulatory Visit

## 2014-10-03 SURGERY — MANOMETRY, ESOPHAGUS

## 2014-10-12 NOTE — Progress Notes (Signed)
Patient ID: [name] [name], female   DOB: 1935/03/30, 79 y.o.   MRN: [id number]     Patient Care Team: [name], MD as PCP - General (Family Medicine)  HPI  [name] [name] is a 79 y.o. female with history of arthritis and asthma. She was incidentally found to have LBBB prior to the knee surgery and underwent a Lexiscan nuclear stress in [place] test that was negative. She then developed progressively worsening chest pain and on 10/11/2013 she went to the ER for chest pain that woke her from sleep. ACS was ruled out and she was discharged home.  She continued to have exertional dyspnea and in December 2015 underwent a cardiac cath that was normal. Her echo showed LVEF 30-35% and she was started on Lisinopril 5 mg po daily. She has been experiencing chest pain radiating to her neck and shoulder couple of mornings, resolved by sl NTG. Her daughter has Prinzmetal angina and she has h/o "tetanus like spasm" about 3 years ago, per patient, she had to immobilized in order to prevent bone breaks.  She underwent a BIV ICD implantation on 07/04/2014 as her LVEF worsened to 25-30% despite optimal therapy and her symptoms were NYHA class III. She also had episodes of nsVT of link monitor. She has since then improved, now class IIb.Link was removed at the time of BiV ICD placement. She was hospitalized in July for an episode of nausea and vomiting with elevated lipise. This is now resolved.  She feels overall better, improved DOE, CP, some palpitations with no dizziness and no syncope. No LE edema, no orthopnea or PND.  10/12/2013 - she feels slightly better, has more energy, she has occasional PND, no LE edema, chest pain.DOE with moderate exertion, no ICD shock or syncope.   Past Medical History  Diagnosis Date  . Left bundle branch block   . Asthma   . Thyroid disease   . Arthritis   . Pancreatitis, acute   . Angina effort   . Congestive dilated cardiomyopathy 03/31/2014  . LBBB (left  bundle branch block) 10/30/2013   Past Surgical History  Procedure Laterality Date  . Hernia repair    . Abdominal hysterectomy    . Cholecystectomy    . Left heart catheterization with coronary angiogram N/A 11/02/2013    Procedure: LEFT HEART CATHETERIZATION WITH CORONARY ANGIOGRAM;  Surgeon: Corky Crafts, MD;  Location: [facility] CATH LAB;  Service: Cardiovascular;  Laterality: N/A;  . Loop recorder implant N/A 04/13/2014    Procedure: LOOP RECORDER IMPLANT;  Surgeon: Duke Salvia, MD;  Location: [facility] CATH LAB;  Service: Cardiovascular;  Laterality: N/A;  . Ep implantable device N/A 07/04/2014    Procedure: BiV ICD Insertion CRT-D;  Surgeon: Duke Salvia, MD;  Location: [facility] INVASIVE CV LAB;  Service: Cardiovascular;  Laterality: N/A;  . Pacemaker insertion    . Cardiac defibrillator placement     Current Outpatient Prescriptions  Medication Sig Dispense Refill  . aspirin EC 81 MG tablet Take 1 tablet (81 mg total) by mouth daily. 90 tablet 3  . aspirin-acetaminophen-caffeine (EXCEDRIN MIGRAINE) 250-250-65 MG per tablet Take 1 tablet by mouth every 6 (six) hours as needed for headache or migraine.    . betamethasone dipropionate 0.05 % lotion Apply 1 application topically daily as needed (for toes).     . carvedilol (COREG) 6.25 MG tablet Take 6.25 mg by mouth 2 (two) times daily.    . Coenzyme Q10 (CO Q 10 PO) Take  1 tablet by mouth daily.    [name] HYDROcodone-acetaminophen (NORCO/VICODIN) 5-325 MG per tablet Take 1 tablet by mouth 2 (two) times daily as needed for severe pain. 16 tablet 0  . lisinopril (PRINIVIL,ZESTRIL) 5 MG tablet Take 1 tablet (5 mg total) by mouth daily. 90 tablet 3  . magnesium oxide (MAG-OX) 400 MG tablet Take 1 tablet (400 mg total) by mouth daily.    [name] omeprazole (PRILOSEC) 40 MG capsule Take 1 capsule by mouth daily.    . ondansetron (ZOFRAN) 4 MG tablet Take 2 mg by mouth every 8 (eight) hours as needed for nausea or vomiting.     [name] PROAIR HFA 108 (90 BASE)  MCG/ACT inhaler Inhale 1 puff into the lungs every 4 (four) hours as needed for wheezing or shortness of breath.     . spironolactone (ALDACTONE) 25 MG tablet Take 0.5 tablets (12.5 mg total) by mouth daily. (Patient taking differently: Take 12.5 mg by mouth 3 (three) times a week. ) 15 tablet 3  . SYNTHROID 100 MCG tablet Take 100 mcg by mouth daily.    . Vitamin D, Ergocalciferol, (DRISDOL) 50000 UNITS CAPS capsule Take 50,000 Units by mouth every 14 (fourteen) days.    . furosemide (LASIX) 20 MG tablet Take 1 tablet (20 mg total) by mouth daily as needed for edema. 90 tablet 3  . traMADol (ULTRAM) 50 MG tablet Take 1 tablet (50 mg total) by mouth every 12 (twelve) hours as needed. 30 tablet 3   No current facility-administered medications for this visit.    Allergies  Allergen Reactions  . Sulfa Antibiotics Hives  . Latex Itching   Review of Systems negative except from HPI and PMH  Physical Exam There were no vitals taken for this visit. Well developed and well nourished in no acute distress HENT normal E scleral and icterus clear Neck Supple JVP 6-7; carotids brisk and full Clear to ausculation  Regular rate and rhythm, no murmurs gallops or rub Soft with active bowel sounds No clubbing cyanosis   Trace edema  Alert and oriented, grossly normal motor and sensory function Skin Warm and Dry   ECG demonstrates sinus rhythm at 77 Intervals 12/24/43 Left bundle branch block  TTE: 09/13/2014 - Left ventricle: The cavity size was normal. Systolic function was moderately reduced. The estimated ejection fraction was in the range of 35% to 40%. Wall motion was normal; there were no regional wall motion abnormalities. Doppler parameters are consistent with abnormal left ventricular relaxation (grade 1 diastolic dysfunction). Doppler parameters are consistent with elevated ventricular end-diastolic filling pressure. - Aortic valve: There was mild stenosis. There was  mild regurgitation. - Mitral valve: Calcified annulus. Mildly thickened leaflets . - Left atrium: The atrium was normal in size. - Right ventricle: Systolic function was normal. - Tricuspid valve: There was mild regurgitation. - Pulmonary arteries: Systolic pressure was mildly increased. PA peak pressure: 40 mm Hg (S). - Inferior vena cava: The vessel was normal in size. - Pericardium, extracardiac: There was no pericardial effusion.  Impressions: - When compared to the study from 06/08/2014 LVEF has improved now 35-40%, previously 25-30%. Filling pressures are elevated.    Assessment and  Plan  Nonischemic cardiomyopathy Left bundle branch block Tachycardia Symptomatic PVCs  1. Congestive heart failure-chronic-systolic - improved symptoms now class IIb. Euvolemic.LVEF improved from 25-30% to 35-40%. - continue lisinopril 5 mg po daily, carvedilol 6.25 mg po BID, spironolactone 25 mg po daily. - add lasix 20 mg po PRN - encouraged  to start walking regularly  2. Non-ischemic cardiomyopathy, with LBBB, today's ECG shows narrow QRS measuring 90 ms with BiV pacing, PVCs - repeat echo in 3 month  3. HTN - controlled  Follow up in 3 months.   Lars Masson 10/12/2014

## 2014-10-13 ENCOUNTER — Ambulatory Visit (INDEPENDENT_AMBULATORY_CARE_PROVIDER_SITE_OTHER): Payer: Medicare Other | Admitting: Cardiology

## 2014-10-13 ENCOUNTER — Encounter: Payer: Self-pay | Admitting: Cardiology

## 2014-10-13 ENCOUNTER — Other Ambulatory Visit: Payer: Self-pay

## 2014-10-13 VITALS — BP 110/72 | HR 70 | Ht 59.0 in | Wt 160.8 lb

## 2014-10-13 DIAGNOSIS — I5022 Chronic systolic (congestive) heart failure: Secondary | ICD-10-CM | POA: Diagnosis not present

## 2014-10-13 DIAGNOSIS — R072 Precordial pain: Secondary | ICD-10-CM | POA: Diagnosis not present

## 2014-10-13 DIAGNOSIS — Z950 Presence of cardiac pacemaker: Secondary | ICD-10-CM

## 2014-10-13 DIAGNOSIS — M5441 Lumbago with sciatica, right side: Secondary | ICD-10-CM | POA: Diagnosis not present

## 2014-10-13 DIAGNOSIS — I420 Dilated cardiomyopathy: Secondary | ICD-10-CM | POA: Diagnosis not present

## 2014-10-13 DIAGNOSIS — M5442 Lumbago with sciatica, left side: Secondary | ICD-10-CM

## 2014-10-13 MED ORDER — FUROSEMIDE 20 MG PO TABS: 20.0000 mg | ORAL_TABLET | Freq: Every day | ORAL | Status: DC | PRN

## 2014-10-13 MED ORDER — TRAMADOL HCL 50 MG PO TABS: 50.0000 mg | ORAL_TABLET | Freq: Two times a day (BID) | ORAL | Status: DC | PRN

## 2014-10-13 NOTE — Patient Instructions (Signed)
Medication Instructions:   DR Delton See HAS PRESCRIBED FOR YOU TO TAKE TRAMADOL 50 MG TAKE ONE TABLET BY MOUTH EVERY 12 HOURS AS NEEDED FOR BACK PAIN  START TAKING LASIX 20 MG ONCE DAILY ONLY AS NEEDED FOR LOWER EXTREMITY EDEMA      Testing/Procedures:  Your physician has requested that you have an echocardiogram. Echocardiography is a painless test that uses sound waves to create images of your heart. It provides your doctor with information about the size and shape of your heart and how well your heart's chambers and valves are working. This procedure takes approximately one hour. There are no restrictions for this procedure.  DR Delton See WOULD LIKE THIS ECHO SCHEDULED PRIOR TO YOUR 3 MONTH FOLLOW-UP APPOINTMENT WITH HER    Follow-Up:  3 MONTHS WITH DR NELSON---PLEASE HAVE YOUR ECHO DONE PRIOR TO THIS OFFICE VISIT

## 2014-11-01 ENCOUNTER — Ambulatory Visit (INDEPENDENT_AMBULATORY_CARE_PROVIDER_SITE_OTHER): Payer: Medicare Other | Admitting: Internal Medicine

## 2014-11-01 ENCOUNTER — Encounter: Payer: Self-pay | Admitting: Internal Medicine

## 2014-11-01 VITALS — BP 136/70 | HR 65 | Ht 58.5 in | Wt 157.4 lb

## 2014-11-01 DIAGNOSIS — I429 Cardiomyopathy, unspecified: Secondary | ICD-10-CM | POA: Diagnosis not present

## 2014-11-01 DIAGNOSIS — I420 Dilated cardiomyopathy: Secondary | ICD-10-CM

## 2014-11-01 DIAGNOSIS — I447 Left bundle-branch block, unspecified: Secondary | ICD-10-CM

## 2014-11-01 DIAGNOSIS — I428 Other cardiomyopathies: Secondary | ICD-10-CM

## 2014-11-01 DIAGNOSIS — I5022 Chronic systolic (congestive) heart failure: Secondary | ICD-10-CM

## 2014-11-01 LAB — CUP PACEART INCLINIC DEVICE CHECK
Brady Statistic RV Percent Paced: 96 %
HIGH POWER IMPEDANCE MEASURED VALUE: 73 Ohm
Implantable Lead Implant Date: 20160627
Implantable Lead Implant Date: 20160627
Implantable Lead Implant Date: 20160627
Implantable Lead Location: 753859
Implantable Lead Location: 753860
Implantable Lead Model: 5076
Implantable Lead Serial Number: 49263525
Lead Channel Impedance Value: 608 Ohm
Lead Channel Impedance Value: 666 Ohm
Lead Channel Pacing Threshold Amplitude: 0.6 V
Lead Channel Pacing Threshold Amplitude: 1.8 V
Lead Channel Pacing Threshold Pulse Width: 0.4 ms
Lead Channel Pacing Threshold Pulse Width: 0.75 ms
Lead Channel Sensing Intrinsic Amplitude: 1.9 mV
Lead Channel Sensing Intrinsic Amplitude: 20.7 mV
Lead Channel Setting Pacing Amplitude: 2.5 V
Lead Channel Setting Pacing Pulse Width: 0.4 ms
Lead Channel Setting Sensing Sensitivity: 0.8 mV
MDC IDC LEAD LOCATION: 753858
MDC IDC LEAD SERIAL: 49219603
MDC IDC MSMT BATTERY VOLTAGE: 3.12 V
MDC IDC MSMT LEADCHNL RV IMPEDANCE VALUE: 680 Ohm
MDC IDC MSMT LEADCHNL RV PACING THRESHOLD AMPLITUDE: 0.5 V
MDC IDC MSMT LEADCHNL RV PACING THRESHOLD PULSEWIDTH: 0.4 ms
MDC IDC MSMT LEADCHNL RV SENSING INTR AMPL: 24.1 mV
MDC IDC SESS DTM: 20161025144003
MDC IDC SET LEADCHNL LV PACING PULSEWIDTH: 1 ms
MDC IDC SET LEADCHNL LV SENSING SENSITIVITY: 1.6 mV
MDC IDC SET LEADCHNL RA PACING AMPLITUDE: 2 V
MDC IDC SET LEADCHNL RV PACING AMPLITUDE: 2 V
MDC IDC STAT BRADY RA PERCENT PACED: 2 %
Pulse Gen Model: 383547
Pulse Gen Serial Number: 60863705

## 2014-11-01 NOTE — Progress Notes (Signed)
Patient Care Team: [name], MD as PCP - General (Family Medicine)   HPI  [name] [name] is a 79 y.o. female Seen in follow-up for nonischemic cardiomyopathy left bundle branch block and nonsustained ventricular tachycardia. Her ejection fraction most recently was 37% disqualifying her for CRT. She did however have symptoms of presyncope associated with palpitations and we undertook an event recorder.  She continues with symptoms of shortness of breath. Repeat assessment of LV function demonstrated an EF of 25-30% and she underwent CRT-D implantation 6/16. This was pursued following loop recorder insertion demonstrating nonsustained ventricular tachycardia and worsening left ventricular function  She is significantly improved following CRT-D implantation. Functional status is better. Weakness is diminished. Edema is less. s Past Medical History  Diagnosis Date  . Left bundle branch block   . Asthma   . Thyroid disease   . Arthritis   . Pancreatitis, acute   . Angina effort (HCC)   . Congestive dilated cardiomyopathy (HCC) 03/31/2014  . LBBB (left bundle branch block) 10/30/2013    Past Surgical History  Procedure Laterality Date  . Hernia repair    . Abdominal hysterectomy    . Cholecystectomy    . Left heart catheterization with coronary angiogram N/A 11/02/2013    Procedure: LEFT HEART CATHETERIZATION WITH CORONARY ANGIOGRAM;  Surgeon: Corky Crafts, MD;  Location: [facility] CATH LAB;  Service: Cardiovascular;  Laterality: N/A;  . Loop recorder implant N/A 04/13/2014    Procedure: LOOP RECORDER IMPLANT;  Surgeon: Duke Salvia, MD;  Location: [facility] CATH LAB;  Service: Cardiovascular;  Laterality: N/A;  . Ep implantable device N/A 07/04/2014    Procedure: BiV ICD Insertion CRT-D;  Surgeon: Duke Salvia, MD;  Location: [facility] INVASIVE CV LAB;  Service: Cardiovascular;  Laterality: N/A;  . Pacemaker insertion    . Cardiac defibrillator placement      Current  Outpatient Prescriptions  Medication Sig Dispense Refill  . aspirin EC 81 MG tablet Take 1 tablet (81 mg total) by mouth daily. 90 tablet 3  . aspirin-acetaminophen-caffeine (EXCEDRIN MIGRAINE) 250-250-65 MG per tablet Take 1 tablet by mouth every 6 (six) hours as needed for headache or migraine.    . betamethasone dipropionate 0.05 % lotion Apply 1 application topically daily as needed (for toes).     . carvedilol (COREG) 6.25 MG tablet Take 6.25 mg by mouth 2 (two) times daily.    . Coenzyme Q10 (CO Q 10 PO) Take 1 tablet by mouth daily.    . furosemide (LASIX) 20 MG tablet Take 1 tablet (20 mg total) by mouth daily as needed for edema. 90 tablet 3  . HYDROcodone-acetaminophen (NORCO/VICODIN) 5-325 MG per tablet Take 1 tablet by mouth 2 (two) times daily as needed for severe pain. 16 tablet 0  . lisinopril (PRINIVIL,ZESTRIL) 5 MG tablet Take 1 tablet (5 mg total) by mouth daily. 90 tablet 3  . magnesium oxide (MAG-OX) 400 MG tablet Take 1 tablet (400 mg total) by mouth daily.    [name] omeprazole (PRILOSEC) 40 MG capsule Take 1 capsule by mouth daily.    . ondansetron (ZOFRAN) 4 MG tablet Take 2 mg by mouth every 8 (eight) hours as needed for nausea or vomiting.     [name] PROAIR HFA 108 (90 BASE) MCG/ACT inhaler Inhale 1 puff into the lungs every 4 (four) hours as needed for wheezing or shortness of breath.     . spironolactone (ALDACTONE) 25 MG tablet Take 0.5 tablets (12.5  mg total) by mouth daily. (Patient taking differently: Take 12.5 mg by mouth 3 (three) times a week. ) 15 tablet 3  . SYNTHROID 100 MCG tablet Take 100 mcg by mouth daily.    . traMADol (ULTRAM) 50 MG tablet Take 1 tablet (50 mg total) by mouth every 12 (twelve) hours as needed. 30 tablet 3   No current facility-administered medications for this visit.    Allergies  Allergen Reactions  . Sulfa Antibiotics Hives  . Latex Itching    Review of Systems negative except from HPI and PMH  Physical Exam BP 136/70 mmHg  Pulse 65   Ht 4' 10.5" (1.486 m)  Wt 157 lb 6.4 oz (71.396 kg)  BMI 32.33 kg/m2 Well developed and well nourished in no acute distress HENT normal E scleral and icterus clear Neck Supple JVP 6-7; carotids brisk and full Clear to ausculation Device pocket well healed; without hematoma or erythema.  There is no tethering   Regular rate and rhythm, no murmurs gallops or rub Soft with active bowel sounds No clubbing cyanosis  noeedema  Alert and oriented, grossly normal motor and sensory function Skin Warm and Dry   ECG demonstrates sinus rhythm with PVCs. His pacing at 65 Intervals 17/09/42    Assessment and  Plan  Nonischemic cardiomyopathy  Left bundle branch block  Congestive heart failure class II  She is euvolemic. Her device is functioning normally. Output reprogrammed to maximize longevity. Diaphragmatic stimulation is noted with pacing in any factor from pole 1. Chest x-ray was reviewed   medication will be continued

## 2014-11-01 NOTE — Patient Instructions (Signed)
Medication Instructions: - no changes  Labwork: - none  Procedures/Testing: - none  Follow-Up: - Remote monitoring is used to monitor your Pacemaker of ICD from home. This monitoring reduces the number of office visits required to check your device to one time per year. It allows [place] to keep an eye on the functioning of your device to ensure it is working properly. You are scheduled for a device check from home on 01/31/15. You may send your transmission at any time that day. If you have a wireless device, the transmission will be sent automatically. After your physician reviews your transmission, you will receive a postcard with your next transmission date.  - Your physician wants you to follow-up in: 9 months (July 2017) with Dr. Graciela Husbands. You will receive a reminder letter in the mail two months in advance. If you don't receive a letter, please call our office to schedule the follow-up appointment.  Any Additional Special Instructions Will Be Listed Below (If Applicable). - none

## 2014-11-17 ENCOUNTER — Encounter: Payer: Self-pay | Admitting: Internal Medicine

## 2014-11-18 ENCOUNTER — Encounter: Payer: Self-pay | Admitting: Internal Medicine

## 2015-01-31 ENCOUNTER — Ambulatory Visit (INDEPENDENT_AMBULATORY_CARE_PROVIDER_SITE_OTHER): Payer: Medicare Other | Admitting: *Deleted

## 2015-01-31 DIAGNOSIS — I428 Other cardiomyopathies: Secondary | ICD-10-CM

## 2015-01-31 DIAGNOSIS — I429 Cardiomyopathy, unspecified: Secondary | ICD-10-CM | POA: Diagnosis not present

## 2015-01-31 DIAGNOSIS — I5022 Chronic systolic (congestive) heart failure: Secondary | ICD-10-CM | POA: Diagnosis not present

## 2015-02-01 ENCOUNTER — Ambulatory Visit (HOSPITAL_COMMUNITY): Payer: Medicare Other | Attending: Internal Medicine

## 2015-02-01 ENCOUNTER — Other Ambulatory Visit: Payer: Self-pay

## 2015-02-01 DIAGNOSIS — I340 Nonrheumatic mitral (valve) insufficiency: Secondary | ICD-10-CM | POA: Insufficient documentation

## 2015-02-01 DIAGNOSIS — I071 Rheumatic tricuspid insufficiency: Secondary | ICD-10-CM | POA: Insufficient documentation

## 2015-02-01 DIAGNOSIS — M5441 Lumbago with sciatica, right side: Secondary | ICD-10-CM | POA: Diagnosis not present

## 2015-02-01 DIAGNOSIS — Z8249 Family history of ischemic heart disease and other diseases of the circulatory system: Secondary | ICD-10-CM | POA: Insufficient documentation

## 2015-02-01 DIAGNOSIS — R072 Precordial pain: Secondary | ICD-10-CM | POA: Insufficient documentation

## 2015-02-01 DIAGNOSIS — I420 Dilated cardiomyopathy: Secondary | ICD-10-CM | POA: Insufficient documentation

## 2015-02-01 DIAGNOSIS — I517 Cardiomegaly: Secondary | ICD-10-CM | POA: Diagnosis not present

## 2015-02-01 DIAGNOSIS — I5022 Chronic systolic (congestive) heart failure: Secondary | ICD-10-CM | POA: Diagnosis not present

## 2015-02-01 DIAGNOSIS — M5442 Lumbago with sciatica, left side: Secondary | ICD-10-CM | POA: Insufficient documentation

## 2015-02-01 NOTE — Progress Notes (Signed)
Remote ICD transmission.   

## 2015-02-08 ENCOUNTER — Ambulatory Visit (INDEPENDENT_AMBULATORY_CARE_PROVIDER_SITE_OTHER): Payer: Medicare Other | Admitting: Cardiology

## 2015-02-08 ENCOUNTER — Encounter: Payer: Self-pay | Admitting: Cardiology

## 2015-02-08 ENCOUNTER — Encounter: Payer: Self-pay | Admitting: Internal Medicine

## 2015-02-08 VITALS — BP 124/64 | HR 73 | Ht 58.5 in | Wt 158.0 lb

## 2015-02-08 DIAGNOSIS — I5022 Chronic systolic (congestive) heart failure: Secondary | ICD-10-CM | POA: Diagnosis not present

## 2015-02-08 DIAGNOSIS — I429 Cardiomyopathy, unspecified: Secondary | ICD-10-CM | POA: Diagnosis not present

## 2015-02-08 DIAGNOSIS — I420 Dilated cardiomyopathy: Secondary | ICD-10-CM | POA: Diagnosis not present

## 2015-02-08 DIAGNOSIS — Z8249 Family history of ischemic heart disease and other diseases of the circulatory system: Secondary | ICD-10-CM

## 2015-02-08 DIAGNOSIS — I428 Other cardiomyopathies: Secondary | ICD-10-CM | POA: Insufficient documentation

## 2015-02-08 DIAGNOSIS — Z95810 Presence of automatic (implantable) cardiac defibrillator: Secondary | ICD-10-CM | POA: Insufficient documentation

## 2015-02-08 LAB — LIPID PANEL
Cholesterol: 153 mg/dL (ref 125–200)
HDL: 34 mg/dL — ABNORMAL LOW (ref >=46)
LDL Cholesterol: 99 mg/dL (ref <130)
Total CHOL/HDL Ratio: 4.5 Ratio (ref <=5.0)
Triglycerides: 101 mg/dL (ref <150)
VLDL: 20 mg/dL (ref <30)

## 2015-02-08 LAB — COMPREHENSIVE METABOLIC PANEL
ALT: 10 U/L (ref 6–29)
AST: 15 U/L (ref 10–35)
Albumin: 3.6 g/dL (ref 3.6–5.1)
Alkaline Phosphatase: 68 U/L (ref 33–130)
BUN: 17 mg/dL (ref 7–25)
CO2: 26 mmol/L (ref 20–31)
Calcium: 9 mg/dL (ref 8.6–10.4)
Chloride: 104 mmol/L (ref 98–110)
Creat: 1.2 mg/dL — ABNORMAL HIGH (ref 0.60–0.93)
Glucose, Bld: 101 mg/dL — ABNORMAL HIGH (ref 65–99)
Potassium: 4 mmol/L (ref 3.5–5.3)
Sodium: 140 mmol/L (ref 135–146)
Total Bilirubin: 0.6 mg/dL (ref 0.2–1.2)
Total Protein: 6.2 g/dL (ref 6.1–8.1)

## 2015-02-08 LAB — CBC WITH DIFFERENTIAL/PLATELET
Basophils Absolute: 0.1 10*3/uL (ref 0.0–0.1)
Basophils Relative: 1 % (ref 0–1)
Eosinophils Absolute: 0.8 10*3/uL — ABNORMAL HIGH (ref 0.0–0.7)
Eosinophils Relative: 14 % — ABNORMAL HIGH (ref 0–5)
HCT: 35.8 % — ABNORMAL LOW (ref 36.0–46.0)
Hemoglobin: 11.9 g/dL — ABNORMAL LOW (ref 12.0–15.0)
Lymphocytes Relative: 13 % (ref 12–46)
Lymphs Abs: 0.8 10*3/uL (ref 0.7–4.0)
MCH: 29.3 pg (ref 26.0–34.0)
MCHC: 33.2 g/dL (ref 30.0–36.0)
MCV: 88.2 fL (ref 78.0–100.0)
MPV: 10.9 fL (ref 8.6–12.4)
Monocytes Absolute: 0.5 10*3/uL (ref 0.1–1.0)
Monocytes Relative: 9 % (ref 3–12)
Neutro Abs: 3.8 10*3/uL (ref 1.7–7.7)
Neutrophils Relative %: 63 % (ref 43–77)
Platelets: 156 10*3/uL (ref 150–400)
RBC: 4.06 MIL/uL (ref 3.87–5.11)
RDW: 14.9 % (ref 11.5–15.5)
WBC: 6 10*3/uL (ref 4.0–10.5)

## 2015-02-08 LAB — TSH: TSH: 0.912 u[IU]/mL (ref 0.350–4.500)

## 2015-02-08 MED ORDER — NAPROXEN 500 MG PO TABS: 500.0000 mg | ORAL_TABLET | Freq: Two times a day (BID) | ORAL | Status: DC | PRN

## 2015-02-08 NOTE — Progress Notes (Signed)
Patient ID: [name] [name], female   DOB: 1935/09/02, 80 y.o.   MRN: [id number]       Patient Care Team: [name], MD as PCP - General (Family Medicine)  HPI  [name] [name] is a 80 y.o. female with history of arthritis and asthma. She was incidentally found to have LBBB prior to the knee surgery and underwent a Lexiscan nuclear stress in [place] test that was negative. She then developed progressively worsening chest pain and on 10/11/2013 she went to the ER for chest pain that woke her from sleep. ACS was ruled out and she was discharged home.  She continued to have exertional dyspnea and in December 2015 underwent a cardiac cath that was normal. Her echo showed LVEF 30-35% and she was started on Lisinopril 5 mg po daily. She has been experiencing chest pain radiating to her neck and shoulder couple of mornings, resolved by sl NTG. Her daughter has Prinzmetal angina and she has h/o "tetanus like spasm" about 3 years ago, per patient, she had to immobilized in order to prevent bone breaks.  She underwent a BIV ICD implantation on 07/04/2014 as her LVEF worsened to 25-30% despite optimal therapy and her symptoms were NYHA class III. She also had episodes of nsVT of link monitor. She has since then improved, now class IIb.Link was removed at the time of BiV ICD placement. She was hospitalized in July for an episode of nausea and vomiting with elevated lipise. This is now resolved.  She feels overall better, improved DOE, CP, some palpitations with no dizziness and no syncope. No LE edema, no orthopnea or PND.  02/08/2015 - she feels much better, has more energy, denies PND, no LE edema, chest pain, DOE. Her problem is neck pain, left sided thigh numbness that is intermittent and multiple small hand joints swelling and pain post discontinuation of Naprosyn. No ICD shock or syncope, BiV-ICD interrogated today.   Past Medical History  Diagnosis Date  . Left bundle branch block   .  Asthma   . Thyroid disease   . Arthritis   . Pancreatitis, acute   . Angina effort (HCC)   . Congestive dilated cardiomyopathy (HCC) 03/31/2014  . LBBB (left bundle branch block) 10/30/2013   Past Surgical History  Procedure Laterality Date  . Hernia repair    . Abdominal hysterectomy    . Cholecystectomy    . Left heart catheterization with coronary angiogram N/A 11/02/2013    Procedure: LEFT HEART CATHETERIZATION WITH CORONARY ANGIOGRAM;  Surgeon: Corky Crafts, MD;  Location: [facility] CATH LAB;  Service: Cardiovascular;  Laterality: N/A;  . Loop recorder implant N/A 04/13/2014    Procedure: LOOP RECORDER IMPLANT;  Surgeon: Duke Salvia, MD;  Location: [facility] CATH LAB;  Service: Cardiovascular;  Laterality: N/A;  . Ep implantable device N/A 07/04/2014    Procedure: BiV ICD Insertion CRT-D;  Surgeon: Duke Salvia, MD;  Location: [facility] INVASIVE CV LAB;  Service: Cardiovascular;  Laterality: N/A;  . Pacemaker insertion    . Cardiac defibrillator placement     Current Outpatient Prescriptions  Medication Sig Dispense Refill  . aspirin EC 81 MG tablet Take 1 tablet (81 mg total) by mouth daily. 90 tablet 3  . aspirin-acetaminophen-caffeine (EXCEDRIN MIGRAINE) 250-250-65 MG per tablet Take 1 tablet by mouth every 6 (six) hours as needed for headache or migraine.    . betamethasone dipropionate 0.05 % lotion Apply 1 application topically daily as needed (for toes).     [name]  carvedilol (COREG) 6.25 MG tablet Take 6.25 mg by mouth 2 (two) times daily.    . Coenzyme Q10 (CO Q 10 PO) Take 1 tablet by mouth daily.    . furosemide (LASIX) 20 MG tablet Take 1 tablet (20 mg total) by mouth daily as needed for edema. 90 tablet 3  . HYDROcodone-acetaminophen (NORCO/VICODIN) 5-325 MG per tablet Take 1 tablet by mouth 2 (two) times daily as needed for severe pain. 16 tablet 0  . lisinopril (PRINIVIL,ZESTRIL) 5 MG tablet Take 1 tablet (5 mg total) by mouth daily. 90 tablet 3  . magnesium oxide (MAG-OX) 400 MG  tablet Take 1 tablet (400 mg total) by mouth daily.    [name] omeprazole (PRILOSEC) 40 MG capsule Take 1 capsule by mouth daily.    . ondansetron (ZOFRAN) 4 MG tablet Take 2 mg by mouth every 8 (eight) hours as needed for nausea or vomiting.     [name] PROAIR HFA 108 (90 BASE) MCG/ACT inhaler Inhale 1 puff into the lungs every 4 (four) hours as needed for wheezing or shortness of breath.     . spironolactone (ALDACTONE) 25 MG tablet Take 0.5 tablets (12.5 mg total) by mouth daily. (Patient taking differently: Take 12.5 mg by mouth 3 (three) times a week. ) 15 tablet 3  . SYNTHROID 100 MCG tablet Take 100 mcg by mouth daily.    . traMADol (ULTRAM) 50 MG tablet Take 1 tablet (50 mg total) by mouth every 12 (twelve) hours as needed. 30 tablet 3   No current facility-administered medications for this visit.    Allergies  Allergen Reactions  . Sulfa Antibiotics Hives  . Latex Itching   Review of Systems negative except from HPI and PMH  Physical Exam BP 124/64 mmHg  Pulse 73  Ht 4' 10.5" (1.486 m)  Wt 158 lb (71.668 kg)  BMI 32.46 kg/m2 Well developed and well nourished in no acute distress HENT normal E scleral and icterus clear Neck Supple JVP 6-7; carotids brisk and full Clear to ausculation  Regular rate and rhythm, no murmurs gallops or rub Soft with active bowel sounds No clubbing cyanosis   Trace edema  Alert and oriented, grossly normal motor and sensory function Skin Warm and Dry   ECG demonstrates sinus rhythm at 77 Intervals 12/24/43 Left bundle branch block  TTE: 09/13/2014 - Left ventricle: The cavity size was normal. Systolic function was moderately reduced. The estimated ejection fraction was in the range of 35% to 40%. Wall motion was normal; there were no regional wall motion abnormalities. Doppler parameters are consistent with abnormal left ventricular relaxation (grade 1 diastolic dysfunction). Doppler parameters are consistent with elevated ventricular  end-diastolic filling pressure. - Aortic valve: There was mild stenosis. There was mild regurgitation. - Mitral valve: Calcified annulus. Mildly thickened leaflets . - Left atrium: The atrium was normal in size. - Right ventricle: Systolic function was normal. - Tricuspid valve: There was mild regurgitation. - Pulmonary arteries: Systolic pressure was mildly increased. PA peak pressure: 40 mm Hg (S). - Inferior vena cava: The vessel was normal in size. - Pericardium, extracardiac: There was no pericardial effusion.  Impressions: - When compared to the study from 06/08/2014 LVEF has improved now 35-40%, previously 25-30%. Filling pressures are elevated.    Assessment and  Plan  Nonischemic cardiomyopathy Left bundle branch block Tachycardia Symptomatic PVCs  1. Congestive heart failure-chronic-systolic - improved symptoms now class IIa. Euvolemic.LVEF improved from 25-30% to 35-40% to 50-55%. - continue lisinopril 5 mg po  daily, carvedilol 6.25 mg po BID, spironolactone 25 mg po daily. - encouraged to stay active  2. Non-ischemic cardiomyopathy, with LBBB, today's ECG shows narrow QRS measuring 90 ms with BiV pacing, PVCs - normalized LVEF  3. HTN - controlled  4. Osteoarthritis, polymyalgia - Naprosyn is not an ideal drug with known CHF however the patient is in significant discomfort, we will prescribe to be used only PRN for pain and swelling.  Follow up in 6 months. Check CMP, TSH, lipids, CBC today.  Lars Masson 02/08/2015

## 2015-02-08 NOTE — Patient Instructions (Signed)
Medication Instructions:   DR Delton See HAS PRESCRIBED YOU NAPROSYN 500 MG TAKE TWICE DAILY AS NEEDED FOR PAIN    Labwork:  TODAY--CMET, CBC W DIFF, TSH, AND LIPIDS     Follow-Up:  Your physician wants you to follow-up in: 6 MONTHS WITH DR Johnell Comings will receive a reminder letter in the mail two months in advance. If you don't receive a letter, please call our office to schedule the follow-up appointment.      If you need a refill on your cardiac medications before your next appointment, please call your pharmacy.

## 2015-02-15 LAB — CUP PACEART REMOTE DEVICE CHECK
Date Time Interrogation Session: 20170208151126
HighPow Impedance: 76 Ohm
Implantable Lead Implant Date: 20160627
Implantable Lead Implant Date: 20160627
Implantable Lead Location: 753859
Implantable Lead Location: 753860
Implantable Lead Model: 5076
Implantable Lead Serial Number: 49263525
Lead Channel Impedance Value: 666 Ohm
Lead Channel Pacing Threshold Amplitude: 0.6 V
Lead Channel Sensing Intrinsic Amplitude: 2 mV
Lead Channel Setting Pacing Amplitude: 2.5 V
Lead Channel Setting Pacing Pulse Width: 1 ms
Lead Channel Setting Sensing Sensitivity: 1.6 mV
MDC IDC LEAD IMPLANT DT: 20160627
MDC IDC LEAD LOCATION: 753858
MDC IDC LEAD SERIAL: 49219603
MDC IDC MSMT LEADCHNL LV SENSING INTR AMPL: 19.5 mV
MDC IDC MSMT LEADCHNL RA IMPEDANCE VALUE: 550 Ohm
MDC IDC MSMT LEADCHNL RA PACING THRESHOLD PULSEWIDTH: 0.4 ms
MDC IDC MSMT LEADCHNL RV IMPEDANCE VALUE: 695 Ohm
MDC IDC MSMT LEADCHNL RV PACING THRESHOLD AMPLITUDE: 0.5 V
MDC IDC MSMT LEADCHNL RV PACING THRESHOLD PULSEWIDTH: 0.4 ms
MDC IDC MSMT LEADCHNL RV SENSING INTR AMPL: 20 mV
MDC IDC SET LEADCHNL RA PACING AMPLITUDE: 2 V
MDC IDC SET LEADCHNL RV PACING AMPLITUDE: 2 V
MDC IDC SET LEADCHNL RV PACING PULSEWIDTH: 0.4 ms
MDC IDC SET LEADCHNL RV SENSING SENSITIVITY: 0.8 mV
MDC IDC STAT BRADY RA PERCENT PACED: 0 %
MDC IDC STAT BRADY RV PERCENT PACED: 99 %
Pulse Gen Model: 383547
Pulse Gen Serial Number: 60863705

## 2015-02-17 ENCOUNTER — Encounter: Payer: Self-pay | Admitting: Cardiology

## 2015-03-13 ENCOUNTER — Other Ambulatory Visit: Payer: Self-pay | Admitting: Cardiology

## 2015-05-02 ENCOUNTER — Ambulatory Visit (INDEPENDENT_AMBULATORY_CARE_PROVIDER_SITE_OTHER): Payer: Medicare Other | Admitting: *Deleted

## 2015-05-02 DIAGNOSIS — I420 Dilated cardiomyopathy: Secondary | ICD-10-CM

## 2015-05-02 DIAGNOSIS — I5022 Chronic systolic (congestive) heart failure: Secondary | ICD-10-CM

## 2015-05-02 NOTE — Progress Notes (Signed)
Remote ICD transmission.   

## 2015-06-07 ENCOUNTER — Other Ambulatory Visit: Payer: Self-pay | Admitting: Internal Medicine

## 2015-06-14 ENCOUNTER — Encounter: Payer: Self-pay | Admitting: Cardiology

## 2015-06-15 LAB — CUP PACEART REMOTE DEVICE CHECK
Brady Statistic RV Percent Paced: 99 %
Date Time Interrogation Session: 20170608103552
HighPow Impedance: 79 Ohm
Implantable Lead Implant Date: 20160627
Implantable Lead Implant Date: 20160627
Implantable Lead Serial Number: 49263525
Lead Channel Impedance Value: 724 Ohm
Lead Channel Sensing Intrinsic Amplitude: 1.6 mV
Lead Channel Sensing Intrinsic Amplitude: 19 mV
Lead Channel Setting Pacing Pulse Width: 1 ms
Lead Channel Setting Sensing Sensitivity: 0.8 mV
Lead Channel Setting Sensing Sensitivity: 1.6 mV
MDC IDC LEAD IMPLANT DT: 20160627
MDC IDC LEAD LOCATION: 753858
MDC IDC LEAD LOCATION: 753859
MDC IDC LEAD LOCATION: 753860
MDC IDC LEAD SERIAL: 49219603
MDC IDC MSMT BATTERY VOLTAGE: 2.99 V
MDC IDC MSMT LEADCHNL LV IMPEDANCE VALUE: 724 Ohm
MDC IDC MSMT LEADCHNL RA IMPEDANCE VALUE: 507 Ohm
MDC IDC MSMT LEADCHNL RA PACING THRESHOLD AMPLITUDE: 0.6 V
MDC IDC MSMT LEADCHNL RA PACING THRESHOLD PULSEWIDTH: 0.4 ms
MDC IDC MSMT LEADCHNL RV PACING THRESHOLD AMPLITUDE: 0.5 V
MDC IDC MSMT LEADCHNL RV PACING THRESHOLD PULSEWIDTH: 0.4 ms
MDC IDC MSMT LEADCHNL RV SENSING INTR AMPL: 20 mV
MDC IDC SET LEADCHNL LV PACING AMPLITUDE: 2.5 V
MDC IDC SET LEADCHNL RA PACING AMPLITUDE: 2 V
MDC IDC SET LEADCHNL RV PACING AMPLITUDE: 2 V
MDC IDC SET LEADCHNL RV PACING PULSEWIDTH: 0.4 ms
MDC IDC STAT BRADY RA PERCENT PACED: 5 %
Pulse Gen Model: 383547
Pulse Gen Serial Number: 60863705

## 2015-07-19 ENCOUNTER — Ambulatory Visit (INDEPENDENT_AMBULATORY_CARE_PROVIDER_SITE_OTHER): Payer: Medicare Other | Admitting: Internal Medicine

## 2015-07-19 ENCOUNTER — Encounter: Payer: Self-pay | Admitting: Internal Medicine

## 2015-07-19 VITALS — BP 122/66 | HR 74 | Ht <= 58 in | Wt 163.4 lb

## 2015-07-19 DIAGNOSIS — I5022 Chronic systolic (congestive) heart failure: Secondary | ICD-10-CM | POA: Diagnosis not present

## 2015-07-19 DIAGNOSIS — Z95810 Presence of automatic (implantable) cardiac defibrillator: Secondary | ICD-10-CM | POA: Diagnosis not present

## 2015-07-19 DIAGNOSIS — I429 Cardiomyopathy, unspecified: Secondary | ICD-10-CM

## 2015-07-19 DIAGNOSIS — I428 Other cardiomyopathies: Secondary | ICD-10-CM

## 2015-07-19 NOTE — Patient Instructions (Signed)
Medication Instructions: - Your physician recommends that you continue on your current medications as directed. Please refer to the Current Medication list given to you today.  Labwork: - none  Procedures/Testing: - none  Follow-Up: - Remote monitoring is used to monitor your Pacemaker of ICD from home. This monitoring reduces the number of office visits required to check your device to one time per year. It allows [place] to keep an eye on the functioning of your device to ensure it is working properly. You are scheduled for a device check from home on 10/18/15. You may send your transmission at any time that day. If you have a wireless device, the transmission will be sent automatically. After your physician reviews your transmission, you will receive a postcard with your next transmission date.  - Your physician wants you to follow-up in: 6 months with the [facility] & 1 year with [name] Balsam, NP for Dr. Graciela Husbands. You will receive a reminder letter in the mail two months in advance. If you don't receive a letter, please call our office to schedule the follow-up appointment.   Any Additional Special Instructions Will Be Listed Below (If Applicable).     If you need a refill on your cardiac medications before your next appointment, please call your pharmacy.

## 2015-07-19 NOTE — Progress Notes (Signed)
Patient Care Team: [name], MD as PCP - General (Family Medicine)   HPI  [name] [name] is a 80 y.o. female Seen in follow-up for nonischemic cardiomyopathy left bundle branch block and nonsustained ventricular tachycardia. Her ejection fraction most recently was 37% disqualifying her for CRT. She did however have symptoms of presyncope associated with palpitations and we undertook an event recorder.  She continues with symptoms of shortness of breath. Repeat assessment of LV function demonstrated an EF of 25-30% and she underwent CRT-D Bi[employer] implantation 6/16.     She is  significantly improved following CRT-D implantation. Functional status is better.    She is short of breath with significant exertion but is able to climb a flight of stairs up and down. She also notes that she has a sensation where she loses feelings in her legs.     Past Medical History  Diagnosis Date  . Left bundle branch block   . Asthma   . Thyroid disease   . Arthritis   . Pancreatitis, acute   . Angina effort (HCC)   . Congestive dilated cardiomyopathy (HCC) 03/31/2014  . LBBB (left bundle branch block) 10/30/2013    Past Surgical History  Procedure Laterality Date  . Hernia repair    . Abdominal hysterectomy    . Cholecystectomy    . Left heart catheterization with coronary angiogram N/A 11/02/2013    Procedure: LEFT HEART CATHETERIZATION WITH CORONARY ANGIOGRAM;  Surgeon: Corky Crafts, MD;  Location: [facility] CATH LAB;  Service: Cardiovascular;  Laterality: N/A;  . Loop recorder implant N/A 04/13/2014    Procedure: LOOP RECORDER IMPLANT;  Surgeon: Duke Salvia, MD;  Location: [facility] CATH LAB;  Service: Cardiovascular;  Laterality: N/A;  . Ep implantable device N/A 07/04/2014    Procedure: BiV ICD Insertion CRT-D;  Surgeon: Duke Salvia, MD;  Location: [facility] INVASIVE CV LAB;  Service: Cardiovascular;  Laterality: N/A;  . Pacemaker insertion    . Cardiac defibrillator  placement      Current Outpatient Prescriptions  Medication Sig Dispense Refill  . aspirin EC 81 MG tablet Take 1 tablet (81 mg total) by mouth daily. 90 tablet 3  . aspirin-acetaminophen-caffeine (EXCEDRIN MIGRAINE) 250-250-65 MG per tablet Take 1 tablet by mouth every 6 (six) hours as needed for headache or migraine.    . betamethasone dipropionate 0.05 % lotion Apply 1 application topically daily as needed (for toes).     . carvedilol (COREG) 6.25 MG tablet Take 6.25 mg by mouth 2 (two) times daily.    . Coenzyme Q10 (CO Q 10 PO) Take 1 tablet by mouth daily.    . furosemide (LASIX) 20 MG tablet Take 1 tablet (20 mg total) by mouth daily as needed for edema. 90 tablet 3  . lisinopril (PRINIVIL,ZESTRIL) 5 MG tablet Take 1 tablet (5 mg total) by mouth daily. 90 tablet 2  . magnesium oxide (MAG-OX) 400 MG tablet Take 1 tablet (400 mg total) by mouth daily.    . naproxen (NAPROSYN) 500 MG tablet Take 1 tablet (500 mg total) by mouth 2 (two) times daily as needed for mild pain. 180 tablet 3  . omeprazole (PRILOSEC) 40 MG capsule Take 1 capsule by mouth daily.    . ondansetron (ZOFRAN) 4 MG tablet Take 2 mg by mouth every 8 (eight) hours as needed for nausea or vomiting.     [name] PROAIR HFA 108 (90 BASE) MCG/ACT inhaler Inhale 1 puff into the lungs  every 4 (four) hours as needed for wheezing or shortness of breath.     . SYNTHROID 100 MCG tablet Take 100 mcg by mouth daily.    . traMADol (ULTRAM) 50 MG tablet Take 50 mg by mouth every 12 (twelve) hours as needed (for pain).     No current facility-administered medications for this visit.    Allergies  Allergen Reactions  . Sulfa Antibiotics Hives  . Latex Itching    Review of Systems negative except from HPI and PMH  Physical Exam BP 122/66 mmHg  Pulse 74  Ht  (1.473 m)  Wt 163 lb 6.4 oz (74.118 kg)  BMI 34.16 kg/m2  SpO2 97% Well developed and well nourished in no acute distress HENT normal E scleral and icterus clear Neck  Supple JVP 6-7; carotids brisk and full Clear to ausculation Device pocket well healed; without hematoma or erythema.  There is no tethering Regular rate and rhythm, no murmurs gallops or rub Soft with active bowel sounds No clubbing cyanosis  noeedema  Alert and oriented, grossly normal motor and sensory function Skin Warm and Dry   ECG demonstrates sinus rhythm with PVCs. V pacing at 65 Intervals 17/09/42    Assessment and  Plan  Nonischemic cardiomyopathy  Left bundle branch block  CRT-D  Biotronik  The patient's device was interrogated.  The information was reviewed. No changes were made in the programming.     Congestive heart failure class II  Surgical evaluation    From a heart failure perspective she is euvolemic. She is able to ambulate stairs. I think if she needs a colonoscopy because of high risk the risks should be acceptable; however, I told her I did not sufficiently educated to advise her as to the incremental benefit of colonoscopy in her situation versus occult blood testing   Continue current medications

## 2015-07-20 ENCOUNTER — Telehealth: Payer: Self-pay | Admitting: *Deleted

## 2015-07-20 LAB — CUP PACEART INCLINIC DEVICE CHECK
HighPow Impedance: 77 Ohm
Implantable Lead Implant Date: 20160627
Implantable Lead Implant Date: 20160627
Implantable Lead Implant Date: 20160627
Implantable Lead Location: 753859
Implantable Lead Location: 753860
Implantable Lead Model: 5076
Implantable Lead Serial Number: 49219603
Implantable Lead Serial Number: 49263525
Lead Channel Impedance Value: 666 Ohm
Lead Channel Pacing Threshold Amplitude: 2.2 V
Lead Channel Sensing Intrinsic Amplitude: 2.3 mV
Lead Channel Sensing Intrinsic Amplitude: 23.4 mV
Lead Channel Setting Pacing Amplitude: 2 V
Lead Channel Setting Pacing Amplitude: 2.5 V
Lead Channel Setting Pacing Pulse Width: 1 ms
Lead Channel Setting Sensing Sensitivity: 1.6 mV
MDC IDC LEAD LOCATION: 753858
MDC IDC MSMT BATTERY VOLTAGE: 2.97 V
MDC IDC MSMT LEADCHNL LV PACING THRESHOLD PULSEWIDTH: 0.4 ms
MDC IDC MSMT LEADCHNL RA IMPEDANCE VALUE: 536 Ohm
MDC IDC MSMT LEADCHNL RA PACING THRESHOLD AMPLITUDE: 0.5 V
MDC IDC MSMT LEADCHNL RA PACING THRESHOLD PULSEWIDTH: 0.4 ms
MDC IDC MSMT LEADCHNL RV IMPEDANCE VALUE: 782 Ohm
MDC IDC MSMT LEADCHNL RV PACING THRESHOLD AMPLITUDE: 0.4 V
MDC IDC MSMT LEADCHNL RV PACING THRESHOLD PULSEWIDTH: 0.4 ms
MDC IDC MSMT LEADCHNL RV SENSING INTR AMPL: 21.2 mV
MDC IDC SESS DTM: 20170712165500
MDC IDC SET LEADCHNL RV PACING AMPLITUDE: 2 V
MDC IDC SET LEADCHNL RV PACING PULSEWIDTH: 0.4 ms
MDC IDC SET LEADCHNL RV SENSING SENSITIVITY: 0.8 mV
MDC IDC STAT BRADY RA PERCENT PACED: 4 %
MDC IDC STAT BRADY RV PERCENT PACED: 99 %
Pulse Gen Model: 383547
Pulse Gen Serial Number: 60863705

## 2015-07-20 NOTE — Telephone Encounter (Signed)
[facility] requesting call back.  Will attempt to troubleshoot home monitor with patient (has not connected since 05/16/15).

## 2015-07-28 NOTE — Telephone Encounter (Signed)
Spoke with patient regarding home monitor troubleshooting.  Patient reports the light is flashing on the monitor and the "open book" is lit up.  Advised patient that she will have to contact tech services in order to get her monitor reconnected.  Gave [employer] phone number.  Patient is appreciative and denies additional questions or concerns at this time.

## 2015-08-01 NOTE — Telephone Encounter (Signed)
Spoke with patient.  She reports that she has had a call so she has not been able to call [employer] tech services regarding her monitor yet.  Patient states she will call them later this week for assistance.  She is appreciative of call and denies additional questions or concerns at this time.

## 2015-08-17 NOTE — Addendum Note (Signed)
Addended by: Reesa Chew on: 08/17/2015 03:05 PM   Modules accepted: Orders

## 2015-08-24 ENCOUNTER — Encounter: Payer: Self-pay | Admitting: Cardiology

## 2015-09-06 ENCOUNTER — Telehealth: Payer: Self-pay | Admitting: Cardiology

## 2015-09-06 NOTE — Telephone Encounter (Signed)
Spoke w/ pt about her home monitor not updating nightly. She stated that every night before bed she has to reset it to the ok button and every morning its off. Pt is going to call tech services to get help troubleshooting the monitor.

## 2015-09-06 NOTE — Telephone Encounter (Signed)
-----   Message from Sebastian Ache, [place] sent at 09/06/2015 12:15 PM EDT ----- Regarding: REMOTES Hey!  We received a message in BTK about this patient's remote transmissions. We haven't received anything in 21 days. Idk if anything can be done about this since it's BTK?  Thanks,  Levora Angel

## 2015-09-07 ENCOUNTER — Ambulatory Visit (INDEPENDENT_AMBULATORY_CARE_PROVIDER_SITE_OTHER): Payer: Medicare Other | Admitting: Cardiology

## 2015-09-07 ENCOUNTER — Encounter (INDEPENDENT_AMBULATORY_CARE_PROVIDER_SITE_OTHER): Payer: Self-pay

## 2015-09-07 ENCOUNTER — Encounter: Payer: Self-pay | Admitting: Cardiology

## 2015-09-07 VITALS — BP 126/80 | HR 72 | Ht <= 58 in | Wt 163.0 lb

## 2015-09-07 DIAGNOSIS — R072 Precordial pain: Secondary | ICD-10-CM

## 2015-09-07 DIAGNOSIS — I5022 Chronic systolic (congestive) heart failure: Secondary | ICD-10-CM

## 2015-09-07 DIAGNOSIS — M5441 Lumbago with sciatica, right side: Secondary | ICD-10-CM

## 2015-09-07 DIAGNOSIS — M5442 Lumbago with sciatica, left side: Secondary | ICD-10-CM

## 2015-09-07 DIAGNOSIS — I420 Dilated cardiomyopathy: Secondary | ICD-10-CM

## 2015-09-07 DIAGNOSIS — Z95810 Presence of automatic (implantable) cardiac defibrillator: Secondary | ICD-10-CM | POA: Diagnosis not present

## 2015-09-07 DIAGNOSIS — I5023 Acute on chronic systolic (congestive) heart failure: Secondary | ICD-10-CM

## 2015-09-07 DIAGNOSIS — I447 Left bundle-branch block, unspecified: Secondary | ICD-10-CM

## 2015-09-07 MED ORDER — FUROSEMIDE 20 MG PO TABS: 20.0000 mg | ORAL_TABLET | Freq: Every day | ORAL | 3 refills | Status: DC | PRN

## 2015-09-07 NOTE — Progress Notes (Signed)
Patient ID: [name] [name], female   DOB: 01-12-1935, 80 y.o.   MRN: [id number]       Patient Care Team: [name], MD as PCP - General (Family Medicine)  HPI  [name] [name] is a 80 y.o. female with history of arthritis and asthma. She was incidentally found to have LBBB prior to the knee surgery and underwent a Lexiscan nuclear stress in [place] test that was negative. She then developed progressively worsening chest pain and on 10/11/2013 she went to the ER for chest pain that woke her from sleep. ACS was ruled out and she was discharged home.  She continued to have exertional dyspnea and in December 2015 underwent a cardiac cath that was normal. Her echo showed LVEF 30-35% and she was started on Lisinopril 5 mg po daily. She has been experiencing chest pain radiating to her neck and shoulder couple of mornings, resolved by sl NTG. Her daughter has Prinzmetal angina and she has h/o "tetanus like spasm" about 3 years ago, per patient, she had to immobilized in order to prevent bone breaks.  She underwent a BIV ICD implantation on 07/04/2014 as her LVEF worsened to 25-30% despite optimal therapy and her symptoms were NYHA class III. She also had episodes of nsVT of link monitor. She has since then improved, now class IIb.Link was removed at the time of BiV ICD placement. She was hospitalized in July for an episode of nausea and vomiting with elevated lipise. This is now resolved.  She feels overall better, improved DOE, CP, some palpitations with no dizziness and no syncope. No LE edema, no orthopnea or PND.  02/08/2015 - she feels much better, has more energy, denies PND, no LE edema, chest pain, DOE. Her problem is neck pain, left sided thigh numbness that is intermittent and multiple small hand joints swelling and pain post discontinuation of Naprosyn. No ICD shock or syncope, BiV-ICD interrogated today.   09/06/15 - the patient is coming after 6 months, she is overall doing well but  feels like her shortness of breath is getting worse and she has less energy to do things. She denies lower extremity edema, she experiences orthopnea on and off, no chest pain no palpitations or syncope. She has been compliant with her medicines.  Past Medical History:  Diagnosis Date  . Angina effort (HCC)   . Arthritis   . Asthma   . Congestive dilated cardiomyopathy (HCC) 03/31/2014  . LBBB (left bundle branch block) 10/30/2013  . Left bundle branch block   . Pancreatitis, acute   . Thyroid disease    Past Surgical History:  Procedure Laterality Date  . ABDOMINAL HYSTERECTOMY    . CARDIAC DEFIBRILLATOR PLACEMENT    . CHOLECYSTECTOMY    . EP IMPLANTABLE DEVICE N/A 07/04/2014   Procedure: BiV ICD Insertion CRT-D;  Surgeon: Duke Salvia, MD;  Location: [facility] INVASIVE CV LAB;  Service: Cardiovascular;  Laterality: N/A;  . HERNIA REPAIR    . LEFT HEART CATHETERIZATION WITH CORONARY ANGIOGRAM N/A 11/02/2013   Procedure: LEFT HEART CATHETERIZATION WITH CORONARY ANGIOGRAM;  Surgeon: Corky Crafts, MD;  Location: [facility] CATH LAB;  Service: Cardiovascular;  Laterality: N/A;  . LOOP RECORDER IMPLANT N/A 04/13/2014   Procedure: LOOP RECORDER IMPLANT;  Surgeon: Duke Salvia, MD;  Location: [facility] CATH LAB;  Service: Cardiovascular;  Laterality: N/A;  . PACEMAKER INSERTION     Current Outpatient Prescriptions  Medication Sig Dispense Refill  . aspirin EC 81 MG tablet Take 1  tablet (81 mg total) by mouth daily. 90 tablet 3  . aspirin-acetaminophen-caffeine (EXCEDRIN MIGRAINE) 250-250-65 MG per tablet Take 1 tablet by mouth every 6 (six) hours as needed for headache or migraine.    . betamethasone dipropionate 0.05 % lotion Apply 1 application topically daily as needed (for toes).     . carvedilol (COREG) 6.25 MG tablet Take 6.25 mg by mouth 2 (two) times daily.    . Coenzyme Q10 (CO Q 10 PO) Take 1 tablet by mouth daily.    . furosemide (LASIX) 20 MG tablet Take 1 tablet (20 mg total) by mouth daily  as needed for edema. 90 tablet 3  . lisinopril (PRINIVIL,ZESTRIL) 5 MG tablet Take 1 tablet (5 mg total) by mouth daily. 90 tablet 2  . magnesium oxide (MAG-OX) 400 MG tablet Take 1 tablet (400 mg total) by mouth daily.    . naproxen (NAPROSYN) 500 MG tablet Take 1 tablet (500 mg total) by mouth 2 (two) times daily as needed for mild pain. 180 tablet 3  . omeprazole (PRILOSEC) 40 MG capsule Take 1 capsule by mouth daily.    . ondansetron (ZOFRAN) 4 MG tablet Take 2 mg by mouth every 8 (eight) hours as needed for nausea or vomiting.     [name] PROAIR HFA 108 (90 BASE) MCG/ACT inhaler Inhale 1 puff into the lungs every 4 (four) hours as needed for wheezing or shortness of breath.     . SYNTHROID 100 MCG tablet Take 100 mcg by mouth daily.    . traMADol (ULTRAM) 50 MG tablet Take 50 mg by mouth every 12 (twelve) hours as needed (for pain).     No current facility-administered medications for this visit.     Allergies  Allergen Reactions  . Sulfa Antibiotics Hives  . Latex Itching   Review of Systems negative except from HPI and PMH  Physical Exam BP 126/80   Pulse 72   Ht 4\' 10"  (1.473 m)   Wt 163 lb (73.9 kg)   BMI 34.07 kg/m  Well developed and well nourished in no acute distress HENT normal E scleral and icterus clear Neck Supple JVP 6-7; carotids brisk and full Clear to ausculation  Regular rate and rhythm, no murmurs gallops or rub Soft with active bowel sounds No clubbing cyanosis   Trace edema  Alert and oriented, grossly normal motor and sensory function Skin Warm and Dry   ECG demonstrates sinus rhythm at 77 Intervals 12/24/43 Left bundle branch block  TTE: 09/13/2014 - Left ventricle: The cavity size was normal. Systolic function was moderately reduced. The estimated ejection fraction was in the range of 35% to 40%. Wall motion was normal; there were no regional wall motion abnormalities. Doppler parameters are consistent with abnormal left ventricular  relaxation (grade 1 diastolic dysfunction). Doppler parameters are consistent with elevated ventricular end-diastolic filling pressure. - Aortic valve: There was mild stenosis. There was mild regurgitation. - Mitral valve: Calcified annulus. Mildly thickened leaflets . - Left atrium: The atrium was normal in size. - Right ventricle: Systolic function was normal. - Tricuspid valve: There was mild regurgitation. - Pulmonary arteries: Systolic pressure was mildly increased. PA peak pressure: 40 mm Hg (S). - Inferior vena cava: The vessel was normal in size. - Pericardium, extracardiac: There was no pericardial effusion. Impressions: - When compared to the study from 06/08/2014 LVEF has improved now 35-40%, previously 25-30%. Filling pressures are elevated.  TTE: 01/2015 - Left ventricle: The cavity size was normal. There was moderate  concentric hypertrophy. Systolic function was normal. The   estimated ejection fraction was in the range of 50% to 55%. Wall   motion was normal; there were no regional wall motion   abnormalities. Doppler parameters are consistent with abnormal   left ventricular relaxation (grade 1 diastolic dysfunction). The   E/e&' ratio is >15, suggesting elevated LV filling pressure. - Mitral valve: Calcified annulus. Mildly thickened leaflets .   There was trivial regurgitation. - Left atrium: The atrium was normal in size. - Right ventricle: The cavity size was normal. Wall thickness was   normal. Pacer wire or catheter noted in right ventricle. Systolic   function was normal. - Right atrium: The atrium was normal in size. Pacer wire or   catheter noted in right atrium. - Tricuspid valve: There was moderate regurgitation. - Pulmonary arteries: PA peak pressure: 30 mm Hg (S). - Inferior vena cava: The vessel was normal in size. The   respirophasic diameter changes were in the normal range (= 50%),   consistent with normal central venous  pressure.  Impressions: - Compared to a prior study in 09/2014, the EF has improved to   50-55%.   Assessment and  Plan  Nonischemic cardiomyopathy Left bundle branch block Tachycardia Symptomatic PVCs  1. Acute on chronic systolic CHF - originally improved symptoms, but now slight decline NYHA IIb. LVEF improved from 25-30% to 35-40% to 50-55% in January 2017. - continue lisinopril 5 mg po daily, carvedilol 6.25 mg po BID, spironolactone 25 mg po daily. - encouraged to stay active - increase lasix 40 mg po daily for the next 2 weeks, she will call then to see if she has improvement of symptoms  2. Non-ischemic cardiomyopathy, with LBBB, today's ECG shows wider QRS 130 ms, previously 90 ms,  ? If still BiV pacing.  3. HTN - controlled  4. Osteoarthritis, polymyalgia - Naprosyn is not an ideal drug with known CHF however the patient is in significant discomfort, we will prescribe to be used only PRN for pain and swelling.  Follow up in 3 months.   Tobias AlexanderKatarina Taylon Louison 09/07/2015

## 2015-09-07 NOTE — Patient Instructions (Addendum)
Medication Instructions:  INCREASE LASIX TO 40 MG (TAKE 2 OF YOUR 20 MG TABLETS TO =40 MG) FOR 2 WEEKS ONLY, THEN CONTINUE AT REGULAR DOSING THEREAFTER.   Labwork: NO LAB WORK TODAY   Testing/Procedures: NO TESTING    Follow-Up:   IN 3 MONTHS WITH DR. Delton See.     PER DR NELSON, PLEASE CALL us BACK IN 2 WEEKS TO REPORT HOW YOU ARE FEELING

## 2015-09-09 ENCOUNTER — Other Ambulatory Visit: Payer: Self-pay | Admitting: Cardiology

## 2015-09-22 ENCOUNTER — Telehealth: Payer: Self-pay | Admitting: Cardiology

## 2015-09-22 NOTE — Telephone Encounter (Signed)
New message      Calling to give an update.  She was seen 2 weeks ago and was instructed to call with an update

## 2015-09-22 NOTE — Telephone Encounter (Signed)
Dr Lindaann Slough assessment and plan for this pt at last OV on 09/07/15.  1. Acute on chronic systolic CHF - originally improved symptoms, but now slight decline NYHA IIb. LVEF improved from 25-30% to 35-40% to 50-55% in January 2017. - continue lisinopril 5 mg po daily, carvedilol 6.25 mg po BID, spironolactone 25 mg po daily. - encouraged to stay active - increase lasix 40 mg po daily for the next 2 weeks, she will call then to see if she has improvement of symptoms.    Pt was instructed by Dr Delton See to call our office back in 2 weeks to report if her symptoms have improved.  Pt states since Dr Delton See increased her lasix, she has noted that she feels less sob.  Pt states she can actually rest in the bed, without feeling sob. Pt states her swelling has improved too.  Informed the pt that Dr Delton See is out of the office today, but I will route this message to her for further review and recommendation if needed, and follow-up with the pt thereafter.  Advised the pt to continue her current lasix regimen, as Dr Delton See advised at the last OV.  Advised the pt that she should continue to stay active, drink water, and avoid foods high in sodium.  Pt verbalized understanding and agrees with this plan.

## 2015-09-22 NOTE — Telephone Encounter (Signed)
Thank you :)

## 2015-09-25 ENCOUNTER — Telehealth: Payer: Self-pay | Admitting: *Deleted

## 2015-09-25 NOTE — Telephone Encounter (Signed)
Notified the pt that Dr Delton See reviewed her symptoms she called into the office last week, since recent med changes made.  Informed the pt that Dr Delton See is pleased with the information provided and wants her to continue her current regimen.  Advised the pt to call us back if her symptoms exacerbate again.  Pt verbalized understanding and agrees with this plan.

## 2015-09-25 NOTE — Telephone Encounter (Signed)
Lajoyce Corners could you schedule an appointment?   Previous Messages    ----- Message -----  From: Duke Salvia, MD  Sent: 09/25/2015  1:57 PM  To: Lars Masson, MD   K Her LBBB QRSd pre implant was 175  The ECG with narrow QRS I suspect is not BIV pacing but rather the absence of rate related LBBB  Her post implant ECG QRS 175>>130 so all consistent  I would be glad to see her ( M-WOKO) to see if there is any tweaking I can do  steve   ----- Message -----  From: Lars Masson, MD  Sent: 09/07/2015 10:54 AM  To: Duke Salvia, MD   Brett Canales,  We both follow this patient, she had LBBB and LVEF 30%, received BiV and improved to 50% with improvement of symptoms.  She now feels worsening SOB, I felt that her QRS widened from 90 to 130 ms, and I am not sure if she is still being BiV paced.  Would you be able to look at her ECGs and check her pacer?  Thank you,  KN      Pt scheduled to see Dr Graciela Husbands this Friday 9/22 at 2:30 pm. Pt aware of appt date and time by Scheduling.  Pt agreed to this plan.

## 2015-09-29 ENCOUNTER — Ambulatory Visit (INDEPENDENT_AMBULATORY_CARE_PROVIDER_SITE_OTHER): Payer: Medicare Other | Admitting: Internal Medicine

## 2015-09-29 ENCOUNTER — Encounter: Payer: Self-pay | Admitting: Internal Medicine

## 2015-09-29 VITALS — BP 152/74 | HR 81 | Ht 59.0 in | Wt 166.8 lb

## 2015-09-29 DIAGNOSIS — I428 Other cardiomyopathies: Secondary | ICD-10-CM

## 2015-09-29 DIAGNOSIS — Z95810 Presence of automatic (implantable) cardiac defibrillator: Secondary | ICD-10-CM | POA: Diagnosis not present

## 2015-09-29 DIAGNOSIS — I447 Left bundle-branch block, unspecified: Secondary | ICD-10-CM | POA: Diagnosis not present

## 2015-09-29 DIAGNOSIS — I429 Cardiomyopathy, unspecified: Secondary | ICD-10-CM | POA: Diagnosis not present

## 2015-09-29 DIAGNOSIS — I5022 Chronic systolic (congestive) heart failure: Secondary | ICD-10-CM | POA: Diagnosis not present

## 2015-09-29 LAB — CUP PACEART INCLINIC DEVICE CHECK
Implantable Lead Implant Date: 20160627
Implantable Lead Implant Date: 20160627
Implantable Lead Location: 753859
Implantable Lead Location: 753860
Implantable Lead Model: 5076
Implantable Lead Serial Number: 49263525
MDC IDC LEAD IMPLANT DT: 20160627
MDC IDC LEAD LOCATION: 753858
MDC IDC LEAD SERIAL: 49219603
MDC IDC PG SERIAL: 60863705
MDC IDC SESS DTM: 20171027134307
Pulse Gen Model: 383547

## 2015-09-29 NOTE — Progress Notes (Signed)
Patient Care Team: [name], MD as PCP - General (Family Medicine)   HPI  [name] [name] is a 80 y.o. female Seen in follow-up for nonischemic cardiomyopathy left bundle branch block and nonsustained ventricular tachycardia.   She underwent CRT Biotronik implantation  6/16. Initially she was significantly better.    She is referred back by Dr. Verlin Fester because of worsening shortness of breath and an impression of the QRS was changing width  She has not noted significant edema. There've been no orthopnea. She does have moderately worse shortness of breath with activity. There is been no syncope or chest pain.         Past Medical History:  Diagnosis Date  . Angina effort (HCC)   . Arthritis   . Asthma   . Congestive dilated cardiomyopathy (HCC) 03/31/2014  . LBBB (left bundle branch block) 10/30/2013  . Left bundle branch block   . Pancreatitis, acute   . Thyroid disease     Past Surgical History:  Procedure Laterality Date  . ABDOMINAL HYSTERECTOMY    . CARDIAC DEFIBRILLATOR PLACEMENT    . CHOLECYSTECTOMY    . EP IMPLANTABLE DEVICE N/A 07/04/2014   Procedure: BiV ICD Insertion CRT-D;  Surgeon: Duke Salvia, MD;  Location: [facility] INVASIVE CV LAB;  Service: Cardiovascular;  Laterality: N/A;  . HERNIA REPAIR    . LEFT HEART CATHETERIZATION WITH CORONARY ANGIOGRAM N/A 11/02/2013   Procedure: LEFT HEART CATHETERIZATION WITH CORONARY ANGIOGRAM;  Surgeon: Corky Crafts, MD;  Location: [facility] CATH LAB;  Service: Cardiovascular;  Laterality: N/A;  . LOOP RECORDER IMPLANT N/A 04/13/2014   Procedure: LOOP RECORDER IMPLANT;  Surgeon: Duke Salvia, MD;  Location: [facility] CATH LAB;  Service: Cardiovascular;  Laterality: N/A;  . PACEMAKER INSERTION      Current Outpatient Prescriptions  Medication Sig Dispense Refill  . aspirin EC 81 MG tablet Take 1 tablet (81 mg total) by mouth daily. 90 tablet 3  . aspirin-acetaminophen-caffeine (EXCEDRIN MIGRAINE) 250-250-65 MG per  tablet Take 1 tablet by mouth every 6 (six) hours as needed for headache or migraine.    . betamethasone dipropionate 0.05 % lotion Apply 1 application topically daily as needed (for toes).     . carvedilol (COREG) 6.25 MG tablet Take 6.25 mg by mouth 2 (two) times daily.    . carvedilol (COREG) 6.25 MG tablet Take 1 tablet (6.25 mg total) by mouth 2 (two) times daily with a meal. 180 tablet 1  . Coenzyme Q10 (CO Q 10 PO) Take 1 tablet by mouth daily.    . furosemide (LASIX) 20 MG tablet Take 1 tablet (20 mg total) by mouth daily as needed for edema. 90 tablet 3  . lisinopril (PRINIVIL,ZESTRIL) 5 MG tablet Take 1 tablet (5 mg total) by mouth daily. 90 tablet 2  . magnesium oxide (MAG-OX) 400 MG tablet Take 1 tablet (400 mg total) by mouth daily.    . naproxen (NAPROSYN) 500 MG tablet Take 1 tablet (500 mg total) by mouth 2 (two) times daily as needed for mild pain. 180 tablet 3  . omeprazole (PRILOSEC) 40 MG capsule Take 1 capsule by mouth daily.    . ondansetron (ZOFRAN) 4 MG tablet Take 2 mg by mouth every 8 (eight) hours as needed for nausea or vomiting.     [name] PROAIR HFA 108 (90 BASE) MCG/ACT inhaler Inhale 1 puff into the lungs every 4 (four) hours as needed for wheezing or shortness of breath.     [name]  SYNTHROID 100 MCG tablet Take 100 mcg by mouth daily.    . traMADol (ULTRAM) 50 MG tablet Take 50 mg by mouth every 12 (twelve) hours as needed (for pain).     No current facility-administered medications for this visit.     Allergies  Allergen Reactions  . Sulfa Antibiotics Hives  . Latex Itching    Review of Systems negative except from HPI and PMH  Physical Exam BP (!) 152/74   Pulse 81   Ht 4\' 11"  (1.499 m)   Wt 166 lb 12.8 oz (75.7 kg)   SpO2 98%   BMI 33.69 kg/m  Well developed and well nourished in no acute distress HENT normal E scleral and icterus clear Neck Supple JVP 6-7; carotids brisk and full Clear to ausculation Device pocket well healed; without hematoma or  erythema.  There is no tethering Regular rate and rhythm, no murmurs gallops or rub Soft with active bowel sounds No clubbing cyanosis  No edema  Alert and oriented, grossly normal motor and sensory function Skin Warm and Dry  ECG demonstrates sinus rhythm with P-synchronous/ AV  pacing  Intervals 17/12/43. QRS was negative in lead V1 and upright in lead 1.  With multiple manipulations ultimately LV first by 50 and shortening the AV delay from 120--90 (PV interval by ECG 150 ms) B QRS morphology became upright in lead V2 and remained about 130 ms.  Assessment and  Plan  Nonischemic cardiomyopathy  Left bundle branch block  CRT-D  Biotronik  The patient's device was interrogated.  The information was reviewed. No changes were made in the programming.     Congestive heart failure class III  Hypertension   Her device was reprogrammed. This may improve resynchronization. There may also be a role for AV optimization to assure adequate filling times. Review of the chest x-ray demonstrated only the distal poles were inducible. Unfortunately the distal pole was associated with diaphragmatic stimulation.  Her blood pressure remains elevated. She'll be following up with Dr. Verlin FesterKN. It would be worth considering the use of Entresto or up titration of her carvedilol. I did not do it today so to avoid taking too many changes at one time  More than 50% of 45 min was spent in counseling related to the above

## 2015-09-29 NOTE — Patient Instructions (Addendum)
Medication Instructions: - Your physician recommends that you continue on your current medications as directed. Please refer to the Current Medication list given to you today.  Labwork: - none ordered  Procedures/Testing: - Your physician has recommended that you have an AV optimization echo. During this procedure, an echocardiogram is performed to optimize the timing of your device using ultrasound and a device programmer. Changes will be made to the device settings to help the heart chambers pump more efficiently. This procedure takes approximately one hour.  Follow-Up: - as scheduled with Dr. Delton See  - Your physician wants you to follow-up in: July 2018 with Gypsy Balsam, NP. You will receive a reminder letter in the mail two months in advance. If you don't receive a letter, please call our office to schedule the follow-up appointment.  Any Additional Special Instructions Will Be Listed Below (If Applicable).     If you need a refill on your cardiac medications before your next appointment, please call your pharmacy.

## 2015-10-17 ENCOUNTER — Ambulatory Visit (HOSPITAL_COMMUNITY): Payer: Medicare Other | Attending: Cardiovascular Disease

## 2015-10-17 ENCOUNTER — Encounter: Payer: Self-pay | Admitting: Internal Medicine

## 2015-10-17 DIAGNOSIS — I5030 Unspecified diastolic (congestive) heart failure: Secondary | ICD-10-CM | POA: Diagnosis not present

## 2015-10-17 DIAGNOSIS — I5022 Chronic systolic (congestive) heart failure: Secondary | ICD-10-CM

## 2015-11-01 ENCOUNTER — Telehealth: Payer: Self-pay | Admitting: Cardiology

## 2015-11-01 NOTE — Telephone Encounter (Addendum)
Remote transmission reviewed. Stable device function. No episodes recorded. Patient admits to being ShOB, but denies LE edema, or abdominal bloat. She says that she has not had any relief with use of her inhaler. She says that she was sick last week, but has since recovered. She denies any changes in her medications or eating habits.   Appt made with Francis Dowse for 11/1 @ 1030. Patient voiced understanding and appreciation of appt.

## 2015-11-01 NOTE — Telephone Encounter (Signed)
Spoke with the pt and she is calling to inform Dr Graciela Husbands and Dr Delton See that ever since she had her AV Optimization echo done last week, she has experienced more sob since then.  Pt has no complaints of chest pain, palpitations, presyncopal or syncopal episodes, just increase in sob both while resting and exerting.  Informed the pt that I spoke with Dr Odessa Fleming RN about this, and both agree that we should defer this to Device clinic for further review and follow-up.  Informed the pt that Device clinic is aware of her issue, and they will follow-up with her shortly.  Pt verbalized understanding and agrees with this plan.  Pt gracious for all the assistance provided.

## 2015-11-01 NOTE — Telephone Encounter (Signed)
Pt had procedure a week or so ago, having more SOB since then, offered to have [place] call since Graciela Husbands did the procedure, however she wants to talk with Lajoyce Corners since dr. Delton See ordered the test.

## 2015-11-06 NOTE — Progress Notes (Signed)
Cardiology Office Note Date:  11/08/2015  Patient ID:  [name] [name], DOB 04/10/1935, MRN [id number] PCP:  Kaleen MaskELKINS,WILSON OLIVER, MD  Cardiologist:  Dr. Delton SeeNelson Electrophysiologist: Dr. Graciela HusbandsKlein   Chief Complaint: SOB  History of Present Illness: [name] [name] is a 80 y.o. female with history of NICM w/CRT-D, asthma, LBBBm chronic CHF (systolic), hypothyroidism comes in to the office today to be seen for Dr. Graciela HusbandsKlein with c/o SOB.  She was last seen by him in September with similar complaints,and her device reprogrammed in effort to improve resynchronization with thoughts to consider AV optimization which was done 10/19/15 noting optimal AV delay appeared to be 90-15100ms by the reader.  He also mentioned consideration of medication adjustments to improve BP and maximize medical therapy.  She reports today that she felt her SOB was worse after the change of her device programming at the echo (noting the AV delay was shortened from 100mg  to 90ms).  She reports particularly at night in bed, sleeps with one large foam pillow.  She feels like she has daytime DOE as well, but that this is unchanged from where she has been for quite some time.  She does not feel her symptoms ar primarily from her lungs and that her inhalers when she uses them have little if any effect on the SOB.  She will a PRN extra lasix dose as recommended by dr. Delton SeeNelson, possibly 2-3 days a week, and on days when out of the house will not take any.  She denies CP, palpitations, no near syncope or syncope.   DEVICE information: Biotronik CRT-D, implanted 07/04/14, Dr. Graciela HusbandsKlein  Past Medical History:  Diagnosis Date  . Angina effort (HCC)   . Arthritis   . Asthma   . Congestive dilated cardiomyopathy (HCC) 03/31/2014  . LBBB (left bundle branch block) 10/30/2013  . Left bundle branch block   . Pancreatitis, acute   . Thyroid disease     Past Surgical History:  Procedure Laterality Date  . ABDOMINAL HYSTERECTOMY    . CARDIAC  DEFIBRILLATOR PLACEMENT    . CHOLECYSTECTOMY    . EP IMPLANTABLE DEVICE N/A 07/04/2014   Procedure: BiV ICD Insertion CRT-D;  Surgeon: Duke SalviaSteven C Klein, MD;  [facility];  Service: Cardiovascular;  Laterality: N/A;  . HERNIA REPAIR    . LEFT HEART CATHETERIZATION WITH CORONARY ANGIOGRAM N/A 11/02/2013   Procedure: LEFT HEART CATHETERIZATION WITH CORONARY ANGIOGRAM;  Surgeon: Corky CraftsJayadeep S Varanasi, MD;  [facility] C[facility];  Service: Cardiovascular;  Laterality: N/A;  . LOOP RECORDER IMPLANT N/A 04/13/2014   Procedure: LOOP RECORDER IMPLANT;  Surgeon: Duke SalviaSteven C Klein, MD;  [facility];  Service: Cardiovascular;  Laterality: N/A;  . PACEMAKER INSERTION      Current Outpatient Prescriptions  Medication Sig Dispense Refill  . aspirin EC 81 MG tablet Take 1 tablet (81 mg total) by mouth daily. 90 tablet 3  . aspirin-acetaminophen-caffeine (EXCEDRIN MIGRAINE) 250-250-65 MG per tablet Take 1 tablet by mouth every 6 (six) hours as needed for headache or migraine.    . betamethasone dipropionate 0.05 % lotion Apply 1 application topically daily as needed (for toes).     . carvedilol (COREG) 6.25 MG tablet Take 1 tablet (6.25 mg total) by mouth 2 (two) times daily with a meal. 180 tablet 1  . Coenzyme Q10 (CO Q 10 PO) Take 1 tablet by mouth daily.    . furosemide (LASIX) 20 MG tablet Take 1 tablet (20 mg total) by  mouth daily as needed for edema. 90 tablet 3  . lisinopril (PRINIVIL,ZESTRIL) 5 MG tablet Take 1 tablet (5 mg total) by mouth daily. 90 tablet 2  . magnesium oxide (MAG-OX) 400 MG tablet Take 1 tablet (400 mg total) by mouth daily.    . naproxen (NAPROSYN) 500 MG tablet Take 1 tablet (500 mg total) by mouth 2 (two) times daily as needed for mild pain. 180 tablet 3  . omeprazole (PRILOSEC) 40 MG capsule Take 1 capsule by mouth daily.    . ondansetron (ZOFRAN) 4 MG tablet Take 2 mg by mouth every 8 (eight) hours as needed for nausea or vomiting.     [name] PROAIR HFA 108 (90  BASE) MCG/ACT inhaler Inhale 1 puff into the lungs every 4 (four) hours as needed for wheezing or shortness of breath.     . SYNTHROID 100 MCG tablet Take 100 mcg by mouth daily.    . traMADol (ULTRAM) 50 MG tablet Take 50 mg by mouth every 12 (twelve) hours as needed (for pain).    [name] FLUARIX QUADRIVALENT 0.5 ML injection ADM 0.5ML IM UTD  0   No current facility-administered medications for this visit.     Allergies:   Sulfa antibiotics; Latex; and Sulfur   Social History:  The patient  reports that she has never smoked. She has never used smokeless tobacco. She reports that she drinks alcohol. She reports that she does not use drugs.   Family History:  The patient's family history includes Arrhythmia in her mother; Heart disease in her father; Hyperlipidemia in her mother.  ROS:  Please see the history of present illness.  All other systems are reviewed and otherwise negative.   PHYSICAL EXAM:  VS:  BP 124/76   Pulse 70   Ht 4\' 11"  (1.499 m)   Wt 164 lb (74.4 kg)   BMI 33.12 kg/m  BMI: Body mass index is 33.12 kg/m. Well nourished, well developed, in no acute distress  HEENT: normocephalic, atraumatic  Neck: no JVD, carotid bruits or masses Cardiac: RRR ; no significant murmurs, no rubs, or gallops Lungs:  Are clear to auscultation bilaterally, no wheezing, rhonchi or rales  Abd: soft, nontender MS: no deformity or atrophy Ext: no edema is appreciated Skin: warm and dry, no rash Neuro:  No gross deficits appreciated Psych: euthymic mood, full affect  ICD site is stable, no tethering or discomfort   EKG:  Done today and reviewed by myself shows SRm V paced, QRS ICD interrogation today and reviewed by myself: battery and lead status is stable, no VT, HR histogram looks good, 99% BiVe paced, thoracic impedence is stable (in review with industry does not suggest fluid)  10/17/15: limited echo for AV optimization Impressions: - The study is performed for AV  optimization   There is grade 1 diastolic dysfunction   The optimal AV delay seems to be around 90-100 ms but i would   defer to the doctors managing the pacer.  02/01/15: TTE Study Conclusions - Left ventricle: The cavity size was normal. There was moderate   concentric hypertrophy. Systolic function was normal. The   estimated ejection fraction was in the range of 50% to 55%. Wall   motion was normal; there were no regional wall motion   abnormalities. Doppler parameters are consistent with abnormal   left ventricular relaxation (grade 1 diastolic dysfunction). The   E/e&' ratio is >15, suggesting elevated LV filling pressure. - Mitral valve: Calcified annulus. Mildly thickened leaflets .  There was trivial regurgitation. - Left atrium: The atrium was normal in size. - Right ventricle: The cavity size was normal. Wall thickness was   normal. Pacer wire or catheter noted in right ventricle. Systolic   function was normal. - Right atrium: The atrium was normal in size. Pacer wire or   catheter noted in right atrium. - Tricuspid valve: There was moderate regurgitation. - Pulmonary arteries: PA peak pressure: 30 mm Hg (S). - Inferior vena cava: The vessel was normal in size. The   respirophasic diameter changes were in the normal range (= 50%),   consistent with normal central venous pressure. Impressions: - Compared to a prior study in 09/2014, the EF has improved to   50-55%.  Recent Labs: 02/08/2015: ALT 10; BUN 17; Creat 1.20; Hemoglobin 11.9; Platelets 156; Potassium 4.0; Sodium 140; TSH 0.912  02/08/2015: Cholesterol 153; HDL 34; LDL Cholesterol 99; Total CHOL/HDL Ratio 4.5; Triglycerides 101; VLDL 20   CrCl cannot be calculated (Patient's most recent lab result is older than the maximum 21 days allowed.).   Wt Readings from Last 3 Encounters:  11/08/15 164 lb (74.4 kg)  09/29/15 166 lb 12.8 oz (75.7 kg)  09/07/15 163 lb (73.9 kg)     Other studies reviewed: Additional  studies/records reviewed today include: summarized above  ASSESSMENT AND PLAN:  1. SOB 1. NICM with interval improvement of her EF     Weight is down 2 pounds, her exam and thoracic impedence do not suggest fluid OL Since she felt worse with the shorter AVdelay, d/w Dr. Graciela Husbands, reprogrammed back to .  She is instructed to use an additional pillow at night. Drew for a [employer] given on/off PRN lasix use She sees Dr. Delton See later this month, will defer any  medication adjustments to her   2. CRT-D     normal device function  3. HTN     stable    Disposition: F/u with Dr. Delton See at her scheduled visit, 3 month remote check and Dr. Graciela Husbands in 6 months, sooner if needed.  Current medicines are reviewed at length with the patient today.  The patient did not have any concerns regarding medicines.  Judith Blonder, PA-C 11/08/2015 3:48 PM     [facility] [address] [place] [address] [phone] (office)  [phone] (fax)

## 2015-11-08 ENCOUNTER — Encounter: Payer: Self-pay | Admitting: Physician Assistant

## 2015-11-08 ENCOUNTER — Ambulatory Visit (INDEPENDENT_AMBULATORY_CARE_PROVIDER_SITE_OTHER): Payer: Medicare Other | Admitting: Physician Assistant

## 2015-11-08 VITALS — BP 124/76 | HR 70 | Ht 59.0 in | Wt 164.0 lb

## 2015-11-08 DIAGNOSIS — Z79899 Other long term (current) drug therapy: Secondary | ICD-10-CM

## 2015-11-08 DIAGNOSIS — I10 Essential (primary) hypertension: Secondary | ICD-10-CM

## 2015-11-08 DIAGNOSIS — I5022 Chronic systolic (congestive) heart failure: Secondary | ICD-10-CM | POA: Diagnosis not present

## 2015-11-08 DIAGNOSIS — I428 Other cardiomyopathies: Secondary | ICD-10-CM

## 2015-11-08 LAB — BASIC METABOLIC PANEL
BUN: 24 mg/dL (ref 7–25)
CALCIUM: 9.8 mg/dL (ref 8.6–10.4)
CO2: 26 mmol/L (ref 20–31)
Chloride: 100 mmol/L (ref 98–110)
Creat: 1.29 mg/dL — ABNORMAL HIGH (ref 0.60–0.93)
GLUCOSE: 83 mg/dL (ref 65–99)
Potassium: 4.2 mmol/L (ref 3.5–5.3)
Sodium: 138 mmol/L (ref 135–146)

## 2015-11-08 NOTE — Patient Instructions (Addendum)
Medication Instructions:   Your physician recommends that you continue on your current medications as directed. Please refer to the Current Medication list given to you today.   If you need a refill on your cardiac medications before your next appointment, please call your pharmacy.  Labwork: BMET TODAY    Testing/Procedures: NONE ORDERED  TODAY    Follow-Up: Remote monitoring is used to monitor your Pacemaker of ICD from home. This monitoring reduces the number of office visits required to check your device to one time per year. It allows [place] to keep an eye on the functioning of your device to ensure it is working properly. You are scheduled for a device check from home on .02/08/16  You may send your transmission at any time that day. If you have a wireless device, the transmission will be sent automatically. After your physician reviews your transmission, you will receive a postcard with your next transmission date.     Any Other Special Instructions Will Be Listed Below (If Applicable).

## 2015-11-09 ENCOUNTER — Telehealth: Payer: Self-pay | Admitting: *Deleted

## 2015-11-09 NOTE — Telephone Encounter (Signed)
-----   Message from [facility] [place], [place] sent at 11/09/2015 10:27 AM EDT ----- Please let the patient know her potassium is OK given her intermittent extra lasix, her kidney function slightly lower then previous, though no need to change anything at this time.  To keep her appointment with Dr. Delton See later this month.  Thanks renee

## 2015-11-09 NOTE — Telephone Encounter (Signed)
Signed,.

## 2015-11-21 ENCOUNTER — Telehealth: Payer: Self-pay | Admitting: Cardiology

## 2015-11-21 NOTE — Telephone Encounter (Signed)
Ivy, Please call her where and when is the procedure done, its safe but they need to use magnet during the procedure. If it is within [facility] we will arrange for a PM rep to be there, if not, the hospital where she go would have to arrange it. Thank you, KN

## 2015-11-21 NOTE — Telephone Encounter (Signed)
New Message:   Dr Ethelene Hal is going to do A Radio Frequency and Rhizotomy in his Lumbar Spine.. Pt has a defibrillator and pacemaker wants to make sure it is alright to do this procedure.

## 2015-11-22 NOTE — Telephone Encounter (Signed)
Procedure information sent to [facility] with Biotronik. Patient notified.

## 2015-11-22 NOTE — Telephone Encounter (Signed)
Will route this message to Device triage to follow-up with pt and Dr Ethelene Hal office, for further instructions and to make arrangements for a PM Rep to be present while the pts procedure is being done.  Device triage is aware of incoming message.

## 2015-11-22 NOTE — Telephone Encounter (Signed)
Called Dr Ethelene Hal office at [facility] to clarify when the pts procedure will be done.  Per Rep at Dr Ethelene Hal office, the pt will be having this done on December 05, 2015 at 0900 at their office in [place] [facility] [address]).  Location is [address].  Will send this message to Device CMA Trudi Ida for further review and follow-up.

## 2015-12-07 ENCOUNTER — Ambulatory Visit (INDEPENDENT_AMBULATORY_CARE_PROVIDER_SITE_OTHER): Payer: Medicare Other | Admitting: Cardiology

## 2015-12-07 ENCOUNTER — Encounter: Payer: Self-pay | Admitting: Cardiology

## 2015-12-07 ENCOUNTER — Ambulatory Visit
Admission: RE | Admit: 2015-12-07 | Discharge: 2015-12-07 | Disposition: A | Discharge status: Home / Self Care | Payer: Medicare Other | Source: Ambulatory Visit | Attending: Cardiology | Admitting: Cardiology

## 2015-12-07 VITALS — BP 120/70 | HR 68 | Ht 59.0 in | Wt 166.4 lb

## 2015-12-07 DIAGNOSIS — Z95810 Presence of automatic (implantable) cardiac defibrillator: Secondary | ICD-10-CM

## 2015-12-07 DIAGNOSIS — I5022 Chronic systolic (congestive) heart failure: Secondary | ICD-10-CM

## 2015-12-07 DIAGNOSIS — Z79899 Other long term (current) drug therapy: Secondary | ICD-10-CM

## 2015-12-07 DIAGNOSIS — I428 Other cardiomyopathies: Secondary | ICD-10-CM

## 2015-12-07 DIAGNOSIS — J209 Acute bronchitis, unspecified: Secondary | ICD-10-CM

## 2015-12-07 DIAGNOSIS — I10 Essential (primary) hypertension: Secondary | ICD-10-CM

## 2015-12-07 LAB — CBC WITH DIFFERENTIAL/PLATELET
Basophils Absolute: 0 cells/uL (ref 0–200)
Basophils Relative: 0 %
Eosinophils Absolute: 495 cells/uL (ref 15–500)
Eosinophils Relative: 5 %
HCT: 39.8 % (ref 35.0–45.0)
Hemoglobin: 13.5 g/dL (ref 11.7–15.5)
Lymphocytes Relative: 11 %
Lymphs Abs: 1089 cells/uL (ref 850–3900)
MCH: 31.9 pg (ref 27.0–33.0)
MCHC: 33.9 g/dL (ref 32.0–36.0)
MCV: 94.1 fL (ref 80.0–100.0)
MPV: 11.6 fL (ref 7.5–12.5)
Monocytes Absolute: 495 cells/uL (ref 200–950)
Monocytes Relative: 5 %
Neutro Abs: 7821 cells/uL — ABNORMAL HIGH (ref 1500–7800)
Neutrophils Relative %: 79 %
Platelets: 208 10*3/uL (ref 140–400)
RBC: 4.23 MIL/uL (ref 3.80–5.10)
RDW: 14.1 % (ref 11.0–15.0)
WBC: 9.9 10*3/uL (ref 3.8–10.8)

## 2015-12-07 LAB — COMPREHENSIVE METABOLIC PANEL
ALT: 9 U/L (ref 6–29)
AST: 18 U/L (ref 10–35)
Albumin: 4.6 g/dL (ref 3.6–5.1)
Alkaline Phosphatase: 68 U/L (ref 33–130)
BUN: 25 mg/dL (ref 7–25)
CO2: 26 mmol/L (ref 20–31)
Calcium: 9.7 mg/dL (ref 8.6–10.4)
Chloride: 103 mmol/L (ref 98–110)
Creat: 1.11 mg/dL — ABNORMAL HIGH (ref 0.60–0.93)
Glucose, Bld: 96 mg/dL (ref 65–99)
Potassium: 4.3 mmol/L (ref 3.5–5.3)
Sodium: 141 mmol/L (ref 135–146)
Total Bilirubin: 0.6 mg/dL (ref 0.2–1.2)
Total Protein: 7.3 g/dL (ref 6.1–8.1)

## 2015-12-07 LAB — LIPID PANEL
Cholesterol: 231 mg/dL — ABNORMAL HIGH (ref <200)
HDL: 61 mg/dL (ref >50)
LDL Cholesterol: 144 mg/dL — ABNORMAL HIGH (ref <100)
Total CHOL/HDL Ratio: 3.8 Ratio (ref <5.0)
Triglycerides: 131 mg/dL (ref <150)
VLDL: 26 mg/dL (ref <30)

## 2015-12-07 LAB — BRAIN NATRIURETIC PEPTIDE: Brain Natriuretic Peptide: 33 pg/mL (ref <100)

## 2015-12-07 MED ORDER — AZITHROMYCIN 250 MG PO TABS: ORAL_TABLET | ORAL | 0 refills | Status: DC

## 2015-12-07 MED ORDER — LOSARTAN POTASSIUM 25 MG PO TABS: 25.0000 mg | ORAL_TABLET | Freq: Every day | ORAL | 3 refills | Status: DC

## 2015-12-07 NOTE — Progress Notes (Signed)
Patient ID: [name] [name], female   DOB: 12-May-1935, 80 y.o.   MRN: [id number]       Patient Care Team: [name], MD as PCP - General (Family Medicine)  HPI  [name] [name] is a 80 y.o. female with history of arthritis and asthma. She was incidentally found to have LBBB prior to the knee surgery and underwent a Lexiscan nuclear stress in [place] test that was negative. She then developed progressively worsening chest pain and on 10/11/2013 she went to the ER for chest pain that woke her from sleep. ACS was ruled out and she was discharged home.  She continued to have exertional dyspnea and in December 2015 underwent a cardiac cath that was normal. Her echo showed LVEF 30-35% and she was started on Lisinopril 5 mg po daily. She has been experiencing chest pain radiating to her neck and shoulder couple of mornings, resolved by sl NTG. Her daughter has Prinzmetal angina and she has h/o "tetanus like spasm" about 3 years ago, per patient, she had to immobilized in order to prevent bone breaks.  She underwent a BIV ICD implantation on 07/04/2014 as her LVEF worsened to 25-30% despite optimal therapy and her symptoms were NYHA class III. She also had episodes of nsVT of link monitor. She has since then improved, now class IIb.Link was removed at the time of BiV ICD placement. She was hospitalized in July for an episode of nausea and vomiting with elevated lipise. This is now resolved.  She feels overall better, improved DOE, CP, some palpitations with no dizziness and no syncope. No LE edema, no orthopnea or PND.  02/08/2015 - she feels much better, has more energy, denies PND, no LE edema, chest pain, DOE. Her problem is neck pain, left sided thigh numbness that is intermittent and multiple small hand joints swelling and pain post discontinuation of Naprosyn. No ICD shock or syncope, BiV-ICD interrogated today.   11/3015 - the patient is coming after 3 months, in August she felt like she  was loosing energy and she was seen by EP for reprograming od AV delay. There were 2 adjustments with improvement of symptoms, however she has felt tired with cough in the last month.  No chills or fever. No LE edema, orthopnea. No palpitations.    Past Medical History:  Diagnosis Date  . Angina effort (HCC)   . Arthritis   . Asthma   . Congestive dilated cardiomyopathy (HCC) 03/31/2014  . LBBB (left bundle branch block) 10/30/2013  . Left bundle branch block   . Pancreatitis, acute   . Thyroid disease    Past Surgical History:  Procedure Laterality Date  . ABDOMINAL HYSTERECTOMY    . CARDIAC DEFIBRILLATOR PLACEMENT    . CHOLECYSTECTOMY    . EP IMPLANTABLE DEVICE N/A 07/04/2014   Procedure: BiV ICD Insertion CRT-D;  Surgeon: Duke Salvia, MD;  Location: [facility] INVASIVE CV LAB;  Service: Cardiovascular;  Laterality: N/A;  . HERNIA REPAIR    . LEFT HEART CATHETERIZATION WITH CORONARY ANGIOGRAM N/A 11/02/2013   Procedure: LEFT HEART CATHETERIZATION WITH CORONARY ANGIOGRAM;  Surgeon: Corky Crafts, MD;  Location: [facility] CATH LAB;  Service: Cardiovascular;  Laterality: N/A;  . LOOP RECORDER IMPLANT N/A 04/13/2014   Procedure: LOOP RECORDER IMPLANT;  Surgeon: Duke Salvia, MD;  Location: [facility] CATH LAB;  Service: Cardiovascular;  Laterality: N/A;  . PACEMAKER INSERTION     Current Outpatient Prescriptions  Medication Sig Dispense Refill  . amoxicillin (AMOXIL) 500  MG capsule Take 500 mg by mouth as needed. TAKE 4 CAPSULES BY MOUTH  1 HOUR PRIOR TO DENTAL APPOINTMENT  0  . aspirin EC 81 MG tablet Take 1 tablet (81 mg total) by mouth daily. 90 tablet 3  . aspirin-acetaminophen-caffeine (EXCEDRIN MIGRAINE) 250-250-65 MG per tablet Take 1 tablet by mouth every 6 (six) hours as needed for headache or migraine.    . betamethasone dipropionate 0.05 % lotion Apply 1 application topically daily as needed (for toes).     . carvedilol (COREG) 6.25 MG tablet Take 1 tablet (6.25 mg total) by mouth 2 (two)  times daily with a meal. 180 tablet 1  . Coenzyme Q10 (CO Q 10 PO) Take 1 tablet by mouth daily.    [name] FLUARIX QUADRIVALENT 0.5 ML injection ADM 0.5ML IM UTD  0  . furosemide (LASIX) 20 MG tablet Take 1 tablet (20 mg total) by mouth daily as needed for edema. 90 tablet 3  . lisinopril (PRINIVIL,ZESTRIL) 5 MG tablet Take 1 tablet (5 mg total) by mouth daily. 90 tablet 2  . magnesium oxide (MAG-OX) 400 MG tablet Take 1 tablet (400 mg total) by mouth daily.    . naproxen (NAPROSYN) 500 MG tablet Take 1 tablet (500 mg total) by mouth 2 (two) times daily as needed for mild pain. 180 tablet 3  . omeprazole (PRILOSEC) 40 MG capsule Take 1 capsule by mouth daily.    . ondansetron (ZOFRAN) 4 MG tablet Take 2 mg by mouth every 8 (eight) hours as needed for nausea or vomiting.     [name] PROAIR HFA 108 (90 BASE) MCG/ACT inhaler Inhale 1 puff into the lungs every 4 (four) hours as needed for wheezing or shortness of breath.     . SYNTHROID 100 MCG tablet Take 100 mcg by mouth daily.    . traMADol (ULTRAM) 50 MG tablet Take 50 mg by mouth every 12 (twelve) hours as needed (for pain).     No current facility-administered medications for this visit.     Allergies  Allergen Reactions  . Sulfa Antibiotics Hives  . Sulfur Hives  . Latex Itching   Review of Systems negative except from HPI and PMH  Physical Exam BP 120/70   Pulse 68   Ht 4\' 11"  (1.499 m)   Wt 166 lb 6.4 oz (75.5 kg)   BMI 33.61 kg/m  Well developed and well nourished in no acute distress HENT normal E scleral and icterus clear Neck Supple JVP 6-7; carotids brisk and full Clear to ausculation  Regular rate and rhythm, no murmurs gallops or rub Soft with active bowel sounds No clubbing cyanosis   Trace edema  Alert and oriented, grossly normal motor and sensory function Skin Warm and Dry   ECG demonstrates sinus rhythm at 77 Intervals 12/24/43 Left bundle branch block  TTE: 09/13/2014 - Left ventricle: The cavity size was normal.  Systolic function was moderately reduced. The estimated ejection fraction was in the range of 35% to 40%. Wall motion was normal; there were no regional wall motion abnormalities. Doppler parameters are consistent with abnormal left ventricular relaxation (grade 1 diastolic dysfunction). Doppler parameters are consistent with elevated ventricular end-diastolic filling pressure. - Aortic valve: There was mild stenosis. There was mild regurgitation. - Mitral valve: Calcified annulus. Mildly thickened leaflets . - Left atrium: The atrium was normal in size. - Right ventricle: Systolic function was normal. - Tricuspid valve: There was mild regurgitation. - Pulmonary arteries: Systolic pressure was mildly increased. PA  peak pressure: 40 mm Hg (S). - Inferior vena cava: The vessel was normal in size. - Pericardium, extracardiac: There was no pericardial effusion. Impressions: - When compared to the study from 06/08/2014 LVEF has improved now 35-40%, previously 25-30%. Filling pressures are elevated.  TTE: 01/2015 - Left ventricle: The cavity size was normal. There was moderate   concentric hypertrophy. Systolic function was normal. The   estimated ejection fraction was in the range of 50% to 55%. Wall   motion was normal; there were no regional wall motion   abnormalities. Doppler parameters are consistent with abnormal   left ventricular relaxation (grade 1 diastolic dysfunction). The   E/e&' ratio is >15, suggesting elevated LV filling pressure. - Mitral valve: Calcified annulus. Mildly thickened leaflets .   There was trivial regurgitation. - Left atrium: The atrium was normal in size. - Right ventricle: The cavity size was normal. Wall thickness was   normal. Pacer wire or catheter noted in right ventricle. Systolic   function was normal. - Right atrium: The atrium was normal in size. Pacer wire or   catheter noted in right atrium. - Tricuspid valve: There was  moderate regurgitation. - Pulmonary arteries: PA peak pressure: 30 mm Hg (S). - Inferior vena cava: The vessel was normal in size. The   respirophasic diameter changes were in the normal range (= 50%),   consistent with normal central venous pressure.  Impressions: - Compared to a prior study in 09/2014, the EF has improved to   50-55%.   Assessment and  Plan  Nonischemic cardiomyopathy Left bundle branch block Tachycardia Symptomatic PVCs  1. Acute bronchitis - start Z pack, CXR today showed no pneumonia or CHF.   2. Chronic systolic CHF - NYHA IIb. LVEF improved from 25-30% to 35-40% to 50-55% in January 2017. - discontinue lisinopril 5 mg po daily, start losartan 25 mg po daily, carvedilol 6.25 mg po BID, spironolactone 25 mg po daily, lasix 20 mg po PRN - appears euvolemic - BNP today normal - thoracic impedance from today normal   3. Non-ischemic cardiomyopathy, with LBBB, s/p BiV PM  4. HTN - controlled  5. Osteoarthritis, polymyalgia - Naprosyn is not an ideal drug with known CHF however the patient is in significant discomfort, we will prescribe to be used only PRN for pain and swelling.  Follow up in 2 weeks. CBC, BNP, lipids today.  Tobias AlexanderKatarina Qusai Kem 12/07/2015

## 2015-12-07 NOTE — Patient Instructions (Signed)
Medication Instructions:   STOP TAKING LISINOPRIL NOW  START TAKING LOSARTAN 25 MG ONCE DAILY  START TAKING Z-PAK (AZITHROMYCIN) 250 MG TABLET NOW FOR ACUTE BRONCHITIS--YOU WILL TAKE 2 TABLETS TODAY AS YOUR 1ST INITIAL DOSE, THEN TAKE 1 TABLET BY MOUTH DAILY THEREAFTER, UNTIL YOUR PACK IS COMPLETE   Labwork:  TODAY--CMET, CBC W DIFF, BNP, AND LIPIDS   Testing/Procedures:  A chest x-ray takes a picture of the organs and structures inside the chest, including the heart, lungs, and blood vessels. This test can show several things, including, whether the heart is enlarges; whether fluid is building up in the lungs; and whether pacemaker / defibrillator leads are still in place.  PLEASE GO TO [facility] ([facility] ON [address] --TODAY FOR YOUR CHEST X-RAY    Follow-Up:  2 WEEKS WITH DR NELSON---PLEASE ADD THIS PATIENT ON PER DR NELSON, ON HER MORNING QUARTER DAY     If you need a refill on your cardiac medications before your next appointment, please call your pharmacy.

## 2015-12-19 ENCOUNTER — Ambulatory Visit (INDEPENDENT_AMBULATORY_CARE_PROVIDER_SITE_OTHER): Payer: Medicare Other | Admitting: Cardiology

## 2015-12-19 VITALS — BP 134/64 | HR 61 | Ht 59.0 in | Wt 166.6 lb

## 2015-12-19 DIAGNOSIS — I5022 Chronic systolic (congestive) heart failure: Secondary | ICD-10-CM

## 2015-12-19 DIAGNOSIS — I428 Other cardiomyopathies: Secondary | ICD-10-CM

## 2015-12-19 DIAGNOSIS — Z95810 Presence of automatic (implantable) cardiac defibrillator: Secondary | ICD-10-CM | POA: Diagnosis not present

## 2015-12-19 DIAGNOSIS — J209 Acute bronchitis, unspecified: Secondary | ICD-10-CM

## 2015-12-19 DIAGNOSIS — I10 Essential (primary) hypertension: Secondary | ICD-10-CM | POA: Diagnosis not present

## 2015-12-19 DIAGNOSIS — I447 Left bundle-branch block, unspecified: Secondary | ICD-10-CM

## 2015-12-19 MED ORDER — CARVEDILOL 12.5 MG PO TABS: 12.5000 mg | ORAL_TABLET | Freq: Two times a day (BID) | ORAL | 3 refills | Status: DC

## 2015-12-19 MED ORDER — ONDANSETRON HCL 4 MG PO TABS: 2.0000 mg | ORAL_TABLET | Freq: Three times a day (TID) | ORAL | 3 refills | Status: DC | PRN

## 2015-12-19 MED ORDER — ONDANSETRON HCL 4 MG PO TABS
2.0000 mg | ORAL_TABLET | Freq: Three times a day (TID) | ORAL | 3 refills | Status: DC | PRN
Start: 2015-12-19 — End: 2016-09-25

## 2015-12-19 NOTE — Progress Notes (Signed)
Patient ID: [name] [name], female   DOB: 26-Apr-1935, 80 y.o.   MRN: [id number]       Patient Care Team: [name], MD as PCP - General (Family Medicine)  HPI  [name] [name] is a 80 y.o. female with history of arthritis and asthma. She was incidentally found to have LBBB prior to the knee surgery and underwent a Lexiscan nuclear stress in [place] test that was negative. She then developed progressively worsening chest pain and on 10/11/2013 she went to the ER for chest pain that woke her from sleep. ACS was ruled out and she was discharged home.  She continued to have exertional dyspnea and in December 2015 underwent a cardiac cath that was normal. Her echo showed LVEF 30-35% and she was started on Lisinopril 5 mg po daily. She has been experiencing chest pain radiating to her neck and shoulder couple of mornings, resolved by sl NTG. Her daughter has Prinzmetal angina and she has h/o "tetanus like spasm" about 3 years ago, per patient, she had to immobilized in order to prevent bone breaks.  She underwent a BIV ICD implantation on 07/04/2014 as her LVEF worsened to 25-30% despite optimal therapy and her symptoms were NYHA class III. She also had episodes of nsVT of link monitor. She has since then improved, now class IIb.Link was removed at the time of BiV ICD placement. She was hospitalized in July for an episode of nausea and vomiting with elevated lipise. This is now resolved.  She feels overall better, improved DOE, CP, some palpitations with no dizziness and no syncope. No LE edema, no orthopnea or PND.  02/08/2015 - she feels much better, has more energy, denies PND, no LE edema, chest pain, DOE. Her problem is neck pain, left sided thigh numbness that is intermittent and multiple small hand joints swelling and pain post discontinuation of Naprosyn. No ICD shock or syncope, BiV-ICD interrogated today.   12/19/2015 patient came 2 weeks ago complaining of productive cough fever and  chills and increased shortness of breath. Her CBC was normal, BNP was normal, and chest x-ray showed no pneumonia. She was started on azithromycin that improved her symptoms.  Today she denies any shortness of breath, chest pain, no lower extremity edema, paroxysmal nocturnal dyspnea, however she has developed new sudden onset uneasy feeling associated with diaphoresis sweating nausea and vomiting. This has happened 3 times in the last 2 weeks. Nt associated with food intake. She was seen by GI a month ago, but no colonoscopy as they worry about her underlying heart disease. The last CBC in 11/17 was 13.5.  No associated palpitations or chest pain. No syncope.  Past Medical History:  Diagnosis Date  . Angina effort (HCC)   . Arthritis   . Asthma   . Congestive dilated cardiomyopathy (HCC) 03/31/2014  . LBBB (left bundle branch block) 10/30/2013  . Left bundle branch block   . Pancreatitis, acute   . Thyroid disease    Past Surgical History:  Procedure Laterality Date  . ABDOMINAL HYSTERECTOMY    . CARDIAC DEFIBRILLATOR PLACEMENT    . CHOLECYSTECTOMY    . EP IMPLANTABLE DEVICE N/A 07/04/2014   Procedure: BiV ICD Insertion CRT-D;  Surgeon: Duke Salvia, MD;  Location: [facility] INVASIVE CV LAB;  Service: Cardiovascular;  Laterality: N/A;  . HERNIA REPAIR    . LEFT HEART CATHETERIZATION WITH CORONARY ANGIOGRAM N/A 11/02/2013   Procedure: LEFT HEART CATHETERIZATION WITH CORONARY ANGIOGRAM;  Surgeon: Corky Crafts, MD;  Location: [facility] CATH LAB;  Service: Cardiovascular;  Laterality: N/A;  . LOOP RECORDER IMPLANT N/A 04/13/2014   Procedure: LOOP RECORDER IMPLANT;  Surgeon: Duke SalviaSteven C Klein, MD;  [facility];  Service: Cardiovascular;  Laterality: N/A;  . PACEMAKER INSERTION     Current Outpatient Prescriptions  Medication Sig Dispense Refill  . amoxicillin (AMOXIL) 500 MG capsule Take 500 mg by mouth as needed. TAKE 4 CAPSULES BY MOUTH  1 HOUR PRIOR TO DENTAL APPOINTMENT  0  . aspirin EC  81 MG tablet Take 1 tablet (81 mg total) by mouth daily. 90 tablet 3  . aspirin-acetaminophen-caffeine (EXCEDRIN MIGRAINE) 250-250-65 MG per tablet Take 1 tablet by mouth every 6 (six) hours as needed for headache or migraine.    [name]. azithromycin (ZITHROMAX Z-PAK) 250 MG tablet Take 2 tablets today as your first dose, then take 1 tablet by mouth daily thereafter, until your pack is complete. 6 each 0  . betamethasone dipropionate 0.05 % lotion Apply 1 application topically daily as needed (for toes).     . carvedilol (COREG) 6.25 MG tablet Take 1 tablet (6.25 mg total) by mouth 2 (two) times daily with a meal. 180 tablet 1  . Coenzyme Q10 (CO Q 10 PO) Take 1 tablet by mouth daily.    [name]. FLUARIX QUADRIVALENT 0.5 ML injection ADM 0.5ML IM UTD  0  . furosemide (LASIX) 20 MG tablet Take 1 tablet (20 mg total) by mouth daily as needed for edema. 90 tablet 3  . losartan (COZAAR) 25 MG tablet Take 1 tablet (25 mg total) by mouth daily. 90 tablet 3  . magnesium oxide (MAG-OX) 400 MG tablet Take 1 tablet (400 mg total) by mouth daily.    . naproxen (NAPROSYN) 500 MG tablet Take 1 tablet (500 mg total) by mouth 2 (two) times daily as needed for mild pain. 180 tablet 3  . omeprazole (PRILOSEC) 40 MG capsule Take 1 capsule by mouth daily.    . ondansetron (ZOFRAN) 4 MG tablet Take 2 mg by mouth every 8 (eight) hours as needed for nausea or vomiting.     [name]. PROAIR HFA 108 (90 BASE) MCG/ACT inhaler Inhale 1 puff into the lungs every 4 (four) hours as needed for wheezing or shortness of breath.     . SYNTHROID 100 MCG tablet Take 100 mcg by mouth daily.    . traMADol (ULTRAM) 50 MG tablet Take 50 mg by mouth every 12 (twelve) hours as needed (for pain).     No current facility-administered medications for this visit.     Allergies  Allergen Reactions  . Sulfa Antibiotics Hives  . Sulfur Hives  . Latex Itching   Review of Systems negative except from HPI and PMH  Physical Exam BP 134/64   Pulse 61   Ht 4'  11" (1.499 m)   Wt 166 lb 9.6 oz (75.6 kg)   SpO2 96%   BMI 33.65 kg/m  Well developed and well nourished in no acute distress HENT normal E scleral and icterus clear Neck Supple JVP 6-7; carotids brisk and full Clear to ausculation  Regular rate and rhythm, no murmurs gallops or rub Soft with active bowel sounds No clubbing cyanosis   Trace edema  Alert and oriented, grossly normal motor and sensory function Skin Warm and Dry  TTE: 09/13/2014 - Left ventricle: The cavity size was normal. Systolic function was moderately reduced. The estimated ejection fraction was in the range of 35% to 40%. Wall motion was  normal; there were no regional wall motion abnormalities. Doppler parameters are consistent with abnormal left ventricular relaxation (grade 1 diastolic dysfunction). Doppler parameters are consistent with elevated ventricular end-diastolic filling pressure. - Aortic valve: There was mild stenosis. There was mild regurgitation. - Mitral valve: Calcified annulus. Mildly thickened leaflets . - Left atrium: The atrium was normal in size. - Right ventricle: Systolic function was normal. - Tricuspid valve: There was mild regurgitation. - Pulmonary arteries: Systolic pressure was mildly increased. PA peak pressure: 40 mm Hg (S). - Inferior vena cava: The vessel was normal in size. - Pericardium, extracardiac: There was no pericardial effusion. Impressions: - When compared to the study from 06/08/2014 LVEF has improved now 35-40%, previously 25-30%. Filling pressures are elevated.  TTE: 01/2015 - Left ventricle: The cavity size was normal. There was moderate   concentric hypertrophy. Systolic function was normal. The   estimated ejection fraction was in the range of 50% to 55%. Wall   motion was normal; there were no regional wall motion   abnormalities. Doppler parameters are consistent with abnormal   left ventricular relaxation (grade 1 diastolic  dysfunction). The   E/e&' ratio is >15, suggesting elevated LV filling pressure. - Mitral valve: Calcified annulus. Mildly thickened leaflets .   There was trivial regurgitation. - Left atrium: The atrium was normal in size. - Right ventricle: The cavity size was normal. Wall thickness was   normal. Pacer wire or catheter noted in right ventricle. Systolic   function was normal. - Right atrium: The atrium was normal in size. Pacer wire or   catheter noted in right atrium. - Tricuspid valve: There was moderate regurgitation. - Pulmonary arteries: PA peak pressure: 30 mm Hg (S). - Inferior vena cava: The vessel was normal in size. The   respirophasic diameter changes were in the normal range (= 50%),   consistent with normal central venous pressure.  Impressions: - Compared to a prior study in 09/2014, the EF has improved to   50-55%.   Assessment and  Plan  Nonischemic cardiomyopathy Left bundle branch block Tachycardia Symptomatic PVCs Nausea  1. Acute bronchitis -resolved with Z-Pak   2. Chronic systolic CHF - NYHA IIb. LVEF improved from 25-30% to 35-40% to 50-55% in January 2017. - continue start losartan 25 mg po daily, carvedilol 12.5 mg po BID, spironolactone 25 mg po daily, lasix 20 mg po PRN - appears euvolemic - BNP normal on 12/07/15 - thoracic impedance from today normal   3. Non-ischemic cardiomyopathy, with LBBB, s/p BiV PM  4. HTN - controlled  5. Osteoarthritis, polymyalgia - Naprosyn is not an ideal drug with known CHF however the patient is in significant discomfort, we will prescribe to be used only PRN for pain and swelling.  6. Nausea - with worsening headaches, unknown cause, for now I will start Zofran 4 mg when necessary, if worse I will consider ordering head CT.  Follow up in 3 months.  Tobias Alexander 12/19/2015

## 2015-12-19 NOTE — Patient Instructions (Signed)
Medication Instructions:   INCREASE YOUR CARVEDILOL TO 12.5 MG TWICE DAILY  DR NELSON REFILLED YOUR ZOFRAN 4 MG TO TAKE ONLY AS NEEDED FOR NAUSEA AND VOMITING    Follow-Up:  3 MONTHS WITH DR Delton See       If you need a refill on your cardiac medications before your next appointment, please call your pharmacy.

## 2015-12-31 ENCOUNTER — Other Ambulatory Visit: Payer: Self-pay | Admitting: Cardiology

## 2015-12-31 DIAGNOSIS — Z8249 Family history of ischemic heart disease and other diseases of the circulatory system: Secondary | ICD-10-CM

## 2015-12-31 DIAGNOSIS — I420 Dilated cardiomyopathy: Secondary | ICD-10-CM

## 2015-12-31 DIAGNOSIS — I5022 Chronic systolic (congestive) heart failure: Secondary | ICD-10-CM

## 2016-01-08 HISTORY — PX: HX LUMBAR FUSION: SHX111

## 2016-02-08 ENCOUNTER — Ambulatory Visit (INDEPENDENT_AMBULATORY_CARE_PROVIDER_SITE_OTHER): Payer: Medicare Other | Admitting: *Deleted

## 2016-02-08 DIAGNOSIS — I428 Other cardiomyopathies: Secondary | ICD-10-CM | POA: Diagnosis not present

## 2016-02-08 NOTE — Progress Notes (Signed)
Remote ICD transmission.   

## 2016-02-13 ENCOUNTER — Other Ambulatory Visit: Payer: Self-pay | Admitting: Physical Medicine and Rehabilitation

## 2016-02-13 DIAGNOSIS — M5416 Radiculopathy, lumbar region: Secondary | ICD-10-CM

## 2016-02-14 ENCOUNTER — Encounter: Payer: Self-pay | Admitting: Cardiology

## 2016-02-20 ENCOUNTER — Ambulatory Visit
Admission: RE | Admit: 2016-02-20 | Discharge: 2016-02-20 | Disposition: A | Discharge status: Home / Self Care | Payer: Medicare Other | Source: Ambulatory Visit | Attending: Physical Medicine and Rehabilitation | Admitting: Physical Medicine and Rehabilitation

## 2016-02-20 DIAGNOSIS — M5416 Radiculopathy, lumbar region: Secondary | ICD-10-CM

## 2016-02-20 LAB — CUP PACEART REMOTE DEVICE CHECK
Battery Voltage: 2.94 V
Brady Statistic RA Percent Paced: 2 %
Brady Statistic RV Percent Paced: 100 %
HIGH POWER IMPEDANCE MEASURED VALUE: 80 Ohm
Implantable Lead Implant Date: 20160627
Implantable Lead Implant Date: 20160627
Implantable Lead Model: 379
Implantable Lead Serial Number: 49219603
Implantable Lead Serial Number: 49263525
Lead Channel Impedance Value: 492 Ohm
Lead Channel Impedance Value: 709 Ohm
Lead Channel Pacing Threshold Amplitude: 0.5 V
Lead Channel Pacing Threshold Amplitude: 0.5 V
Lead Channel Pacing Threshold Pulse Width: 0.4 ms
Lead Channel Pacing Threshold Pulse Width: 0.4 ms
Lead Channel Sensing Intrinsic Amplitude: 14.3 mV
Lead Channel Sensing Intrinsic Amplitude: 16.8 mV
Lead Channel Sensing Intrinsic Amplitude: 2 mV
Lead Channel Setting Pacing Amplitude: 2 V
Lead Channel Setting Pacing Amplitude: 2 V
Lead Channel Setting Sensing Sensitivity: 0.8 mV
Lead Channel Setting Sensing Sensitivity: 1.6 mV
MDC IDC LEAD IMPLANT DT: 20160627
MDC IDC LEAD LOCATION: 753858
MDC IDC LEAD LOCATION: 753859
MDC IDC LEAD LOCATION: 753860
MDC IDC MSMT LEADCHNL LV IMPEDANCE VALUE: 579 Ohm
MDC IDC PG IMPLANT DT: 20160627
MDC IDC PG SERIAL: 60863705
MDC IDC SESS DTM: 20180213100456
MDC IDC SET LEADCHNL LV PACING AMPLITUDE: 2.5 V
MDC IDC SET LEADCHNL LV PACING PULSEWIDTH: 1 ms
MDC IDC SET LEADCHNL RV PACING PULSEWIDTH: 0.4 ms

## 2016-02-20 MED ORDER — MORPHINE SULFATE (PF) 4 MG/ML IV SOLN
5.0000 mg | Freq: Once | INTRAVENOUS | Status: AC
  Administered 2016-02-20: 5 mg via INTRAMUSCULAR

## 2016-02-20 MED ORDER — IOPAMIDOL (ISOVUE-M 200) INJECTION 41%
15.0000 mL | Freq: Once | INTRAMUSCULAR | Status: AC
  Administered 2016-02-20: 15 mL via INTRATHECAL

## 2016-02-20 MED ORDER — ONDANSETRON HCL 4 MG/2ML IJ SOLN
4.0000 mg | Freq: Once | INTRAMUSCULAR | Status: AC
  Administered 2016-02-20: 4 mg via INTRAMUSCULAR

## 2016-02-20 MED ORDER — MEPERIDINE HCL 100 MG/ML IJ SOLN
50.0000 mg | Freq: Once | INTRAMUSCULAR | Status: AC
  Administered 2016-02-20: 50 mg via INTRAMUSCULAR

## 2016-02-20 MED ORDER — ONDANSETRON HCL 4 MG/2ML IJ SOLN: 4.0000 mg | Freq: Four times a day (QID) | INTRAMUSCULAR | Status: DC | PRN

## 2016-02-20 MED ORDER — DIAZEPAM 5 MG PO TABS
5.0000 mg | ORAL_TABLET | Freq: Once | ORAL | Status: AC
  Administered 2016-02-20: 5 mg via ORAL

## 2016-02-20 NOTE — Progress Notes (Signed)
Patient states she has been off Tramadol for at least the past two days.  jkl 

## 2016-02-20 NOTE — Discharge Instructions (Addendum)
Myelogram Discharge Instructions  1. Go home and rest quietly for the next 24 hours.  It is important to lie flat for the next 24 hours.  Get up only to go to the restroom.  You may lie in the bed or on a couch on your back, your stomach, your left side or your right side.  You may have one pillow under your head.  You may have pillows between your knees while you are on your side or under your knees while you are on your back.  2. DO NOT drive today.  Recline the seat as far back as it will go, while still wearing your seat belt, on the way home.  3. You may get up to go to the bathroom as needed.  You may sit up for 10 minutes to eat.  You may resume your normal diet and medications unless otherwise indicated.  Drink plenty of extra fluids today and tomorrow.  4. The incidence of a spinal headache with nausea and/or vomiting is about 5% (one in 20 patients).  If you develop a headache, lie flat and drink plenty of fluids until the headache goes away.  Caffeinated beverages may be helpful.  If you develop severe nausea and vomiting or a headache that does not go away with flat bed rest, call [phone].  5. You may resume normal activities after your 24 hours of bed rest is over; however, do not exert yourself strongly or do any heavy lifting tomorrow.  6. Call your physician for a follow-up appointment.   You may resume Tramadol on Wednesday, February 21, 2016 after 1:00p.m.

## 2016-02-23 ENCOUNTER — Telehealth: Payer: Self-pay

## 2016-02-23 NOTE — Telephone Encounter (Signed)
Spoke with patient after she had a myelogram here 02/20/16.  She states she is doing fine, though she still is in pain.  Her doctor gave her some pain medicine, and she has a follow-up appointment with Dr. Ethelene Hal 02/26/16.  [name] [name]

## 2016-02-29 ENCOUNTER — Encounter: Payer: Self-pay | Admitting: Cardiology

## 2016-03-09 ENCOUNTER — Other Ambulatory Visit: Payer: Self-pay | Admitting: Cardiology

## 2016-03-11 ENCOUNTER — Ambulatory Visit: Payer: Medicare Other | Admitting: Cardiology

## 2016-03-14 ENCOUNTER — Other Ambulatory Visit: Payer: Self-pay | Admitting: Neurological Surgery

## 2016-03-14 DIAGNOSIS — R5381 Other malaise: Secondary | ICD-10-CM

## 2016-03-21 ENCOUNTER — Other Ambulatory Visit: Payer: Self-pay | Admitting: Neurological Surgery

## 2016-03-21 DIAGNOSIS — M8580 Other specified disorders of bone density and structure, unspecified site: Secondary | ICD-10-CM

## 2016-03-27 ENCOUNTER — Other Ambulatory Visit: Payer: Medicare Other

## 2016-03-29 ENCOUNTER — Ambulatory Visit
Admission: RE | Admit: 2016-03-29 | Discharge: 2016-03-29 | Disposition: A | Discharge status: Home / Self Care | Payer: Medicare Other | Source: Ambulatory Visit | Attending: Neurological Surgery | Admitting: Neurological Surgery

## 2016-03-29 DIAGNOSIS — M8580 Other specified disorders of bone density and structure, unspecified site: Secondary | ICD-10-CM

## 2016-04-07 HISTORY — PX: SPINE SURGERY: SHX786

## 2016-04-11 ENCOUNTER — Encounter: Payer: Self-pay | Admitting: Cardiology

## 2016-04-11 ENCOUNTER — Ambulatory Visit (INDEPENDENT_AMBULATORY_CARE_PROVIDER_SITE_OTHER): Payer: Medicare Other | Admitting: Cardiology

## 2016-04-11 ENCOUNTER — Encounter (INDEPENDENT_AMBULATORY_CARE_PROVIDER_SITE_OTHER): Payer: Self-pay

## 2016-04-11 ENCOUNTER — Other Ambulatory Visit: Payer: Self-pay | Admitting: Neurological Surgery

## 2016-04-11 VITALS — BP 124/72 | HR 59 | Ht 59.0 in | Wt 165.0 lb

## 2016-04-11 DIAGNOSIS — I5022 Chronic systolic (congestive) heart failure: Secondary | ICD-10-CM

## 2016-04-11 DIAGNOSIS — I428 Other cardiomyopathies: Secondary | ICD-10-CM

## 2016-04-11 DIAGNOSIS — I10 Essential (primary) hypertension: Secondary | ICD-10-CM

## 2016-04-11 DIAGNOSIS — Z01810 Encounter for preprocedural cardiovascular examination: Secondary | ICD-10-CM

## 2016-04-11 DIAGNOSIS — Z95810 Presence of automatic (implantable) cardiac defibrillator: Secondary | ICD-10-CM | POA: Diagnosis not present

## 2016-04-11 NOTE — Progress Notes (Signed)
Patient ID: [name] [name], female   DOB: [birth year], 81 y.o.   MRN: [id number]       Patient Care Team: [name], MD as PCP - General (Family Medicine)  Reason for visit: Preop evaluation for back surgery  HPI  [name] [name] is a 81 y.o. female with history of arthritis and asthma. She was incidentally found to have LBBB prior to the knee surgery and underwent a Lexiscan nuclear stress in [place] test that was negative. She then developed progressively worsening chest pain and on 10/11/2013 she went to the ER for chest pain that woke her from sleep. ACS was ruled out and she was discharged home.  She continued to have exertional dyspnea and in December 2015 underwent a cardiac cath that was normal. Her echo showed LVEF 30-35% and she was started on Lisinopril 5 mg po daily. She has been experiencing chest pain radiating to her neck and shoulder couple of mornings, resolved by sl NTG. Her daughter has Prinzmetal angina and she has h/o "tetanus like spasm" about 3 years ago, per patient, she had to immobilized in order to prevent bone breaks.  She underwent a BIV ICD implantation on 07/04/2014 as her LVEF worsened to 25-30% despite optimal therapy and her symptoms were NYHA class III. She also had episodes of nsVT of link monitor. She has since then improved, now class IIb.Link was removed at the time of BiV ICD placement. She was hospitalized in July for an episode of nausea and vomiting with elevated lipise. This is now resolved.  She feels overall better, improved DOE, CP, some palpitations with no dizziness and no syncope. No LE edema, no orthopnea or PND.  02/08/2015 - she feels much better, has more energy, denies PND, no LE edema, chest pain, DOE. Her problem is neck pain, left sided thigh numbness that is intermittent and multiple small hand joints swelling and pain post discontinuation of Naprosyn. No ICD shock or syncope, BiV-ICD interrogated today.   12/19/2015 patient came  2 weeks ago complaining of productive cough fever and chills and increased shortness of breath. Her CBC was normal, BNP was normal, and chest x-ray showed no pneumonia. She was started on azithromycin that improved her symptoms.  Today she denies any shortness of breath, chest pain, no lower extremity edema, paroxysmal nocturnal dyspnea, however she has developed new sudden onset uneasy feeling associated with diaphoresis sweating nausea and vomiting. This has happened 3 times in the last 2 weeks. Nt associated with food intake. She was seen by GI a month ago, but no colonoscopy as they worry about her underlying heart disease. The last CBC in 11/17 was 13.5.  No associated palpitations or chest pain. No syncope.  04/11/2016 - the patient is coming after 4 months, she is scheduled for a back surgery. She feels well, denies chest pain, DOE, no LE edema, orthopnea, PND. She has recovered from bronchitis. Denies cough, fever.    Past Medical History:  Diagnosis Date  . Angina effort (HCC)   . Arthritis   . Asthma   . Congestive dilated cardiomyopathy (HCC) 03/31/2014  . LBBB (left bundle branch block) 10/30/2013  . Left bundle branch block   . Pancreatitis, acute   . Thyroid disease    Past Surgical History:  Procedure Laterality Date  . ABDOMINAL HYSTERECTOMY    . CARDIAC DEFIBRILLATOR PLACEMENT    . CHOLECYSTECTOMY    . EP IMPLANTABLE DEVICE N/A 07/04/2014   Procedure: BiV ICD Insertion CRT-D;  Surgeon: Duke Salvia, MD;  Location: [facility] INVASIVE CV LAB;  Service: Cardiovascular;  Laterality: N/A;  . HERNIA REPAIR    . LEFT HEART CATHETERIZATION WITH CORONARY ANGIOGRAM N/A 11/02/2013   Procedure: LEFT HEART CATHETERIZATION WITH CORONARY ANGIOGRAM;  Surgeon: Corky Crafts, MD;  Location: [facility] CATH LAB;  Service: Cardiovascular;  Laterality: N/A;  . LOOP RECORDER IMPLANT N/A 04/13/2014   Procedure: LOOP RECORDER IMPLANT;  Surgeon: Duke Salvia, MD;  Location: [facility] CATH LAB;  Service:  Cardiovascular;  Laterality: N/A;  . PACEMAKER INSERTION     Current Outpatient Prescriptions  Medication Sig Dispense Refill  . amoxicillin (AMOXIL) 500 MG capsule Take 500 mg by mouth as needed. TAKE 4 CAPSULES BY MOUTH  1 HOUR PRIOR TO DENTAL APPOINTMENT  0  . aspirin EC 81 MG tablet Take 1 tablet (81 mg total) by mouth daily. 90 tablet 3  . aspirin-acetaminophen-caffeine (EXCEDRIN MIGRAINE) 250-250-65 MG per tablet Take 1 tablet by mouth every 6 (six) hours as needed for headache or migraine.    . betamethasone dipropionate 0.05 % lotion Apply 1 application topically daily as needed (for toes).     . carvedilol (COREG) 12.5 MG tablet Take 1 tablet (12.5 mg total) by mouth 2 (two) times daily. 180 tablet 3  . Coenzyme Q10 (CO Q 10 PO) Take 1 tablet by mouth daily.    . furosemide (LASIX) 20 MG tablet Take 1 tablet (20 mg total) by mouth daily as needed for edema. 90 tablet 3  . HYDROcodone-acetaminophen (NORCO/VICODIN) 5-325 MG tablet Take 1 tablet by mouth every 6 (six) hours as needed for moderate pain.    . magnesium oxide (MAG-OX) 400 MG tablet Take 1 tablet (400 mg total) by mouth daily.    [name] omeprazole (PRILOSEC) 40 MG capsule Take 1 capsule by mouth daily.    . ondansetron (ZOFRAN) 4 MG tablet Take 0.5 tablets (2 mg total) by mouth every 8 (eight) hours as needed for nausea or vomiting. 20 tablet 3  . PROAIR HFA 108 (90 BASE) MCG/ACT inhaler Inhale 1 puff into the lungs every 4 (four) hours as needed for wheezing or shortness of breath.     . SYNTHROID 112 MCG tablet Take 112 mcg by mouth daily.    . traMADol (ULTRAM) 50 MG tablet Take 50 mg by mouth every 12 (twelve) hours as needed (for pain).    [name] losartan (COZAAR) 25 MG tablet Take 1 tablet (25 mg total) by mouth daily. 90 tablet 3   No current facility-administered medications for this visit.     Allergies  Allergen Reactions  . Sulfa Antibiotics Hives  . Latex Itching   Review of Systems negative except from HPI and  PMH  Physical Exam BP 124/72   Pulse (!) 59   Ht 4\' 11"  (1.499 m)   Wt 165 lb (74.8 kg)   SpO2 98%   BMI 33.33 kg/m  Well developed and well nourished in no acute distress HENT normal E scleral and icterus clear Neck Supple JVP 6-7; carotids brisk and full Clear to ausculation  Regular rate and rhythm, no murmurs gallops or rub Soft with active bowel sounds No clubbing cyanosis   Trace edema  Alert and oriented, grossly normal motor and sensory function Skin Warm and Dry  TTE: 09/13/2014 - Left ventricle: The cavity size was normal. Systolic function was moderately reduced. The estimated ejection fraction was in the range of 35% to 40%. Wall motion was normal; there were no regional  wall motion abnormalities. Doppler parameters are consistent with abnormal left ventricular relaxation (grade 1 diastolic dysfunction). Doppler parameters are consistent with elevated ventricular end-diastolic filling pressure. - Aortic valve: There was mild stenosis. There was mild regurgitation. - Mitral valve: Calcified annulus. Mildly thickened leaflets . - Left atrium: The atrium was normal in size. - Right ventricle: Systolic function was normal. - Tricuspid valve: There was mild regurgitation. - Pulmonary arteries: Systolic pressure was mildly increased. PA peak pressure: 40 mm Hg (S). - Inferior vena cava: The vessel was normal in size. - Pericardium, extracardiac: There was no pericardial effusion. Impressions: - When compared to the study from 06/08/2014 LVEF has improved now 35-40%, previously 25-30%. Filling pressures are elevated.  TTE: 01/2015 - Left ventricle: The cavity size was normal. There was moderate   concentric hypertrophy. Systolic function was normal. The   estimated ejection fraction was in the range of 50% to 55%. Wall   motion was normal; there were no regional wall motion   abnormalities. Doppler parameters are consistent with abnormal   left  ventricular relaxation (grade 1 diastolic dysfunction). The   E/e&' ratio is >15, suggesting elevated LV filling pressure. - Mitral valve: Calcified annulus. Mildly thickened leaflets .   There was trivial regurgitation. - Left atrium: The atrium was normal in size. - Right ventricle: The cavity size was normal. Wall thickness was   normal. Pacer wire or catheter noted in right ventricle. Systolic   function was normal. - Right atrium: The atrium was normal in size. Pacer wire or   catheter noted in right atrium. - Tricuspid valve: There was moderate regurgitation. - Pulmonary arteries: PA peak pressure: 30 mm Hg (S). - Inferior vena cava: The vessel was normal in size. The   respirophasic diameter changes were in the normal range (= 50%),   consistent with normal central venous pressure.  Impressions: - Compared to a prior study in 09/2014, the EF has improved to   50-55%.   Assessment and  Plan  Nonischemic cardiomyopathy Left bundle branch block Tachycardia Symptomatic PVCs Nausea  1. Acute bronchitis -resolved with Z-Pak   2. Chronic systolic CHF - NYHA IIb. LVEF improved from 25-30% to 35-40% to 50-55% in January 2017. - she is euvolemic, NYHA IIa - continue start losartan 25 mg po daily, carvedilol 12.5 mg po BID, spironolactone 25 mg po daily, lasix 20 mg po PRN  3. Non-ischemic cardiomyopathy, with LBBB, s/p BiV PM, meds as above  4. HTN - controlled  5. Nausea - recurrent, with worsening headaches, unknown cause, for now I will start Zofran 4 mg when necessary, if worse I will consider ordering head CT.  6. Preop evaluation for back surgery - there is currently no contraindication from cardiac standpoint to undergo back surgery. Aspirin can be held 7 days prior to the surgery and needs to be restarted ASAP from surgical standpoint.  Follow up in 2 months.  Tobias Alexander 04/11/2016

## 2016-04-11 NOTE — Patient Instructions (Addendum)
Medication Instructions:  Your physician recommends that you continue on your current medications as directed. Please refer to the Current Medication list given to you today.   Labwork: None Ordered   Testing/Procedures: None Ordered   Follow-Up: Your physician recommends that you return for a follow-up appointment on June 6   If you need a refill on your cardiac medications before your next appointment, please call your pharmacy.   Thank you for choosing [facility]! Eligha Bridegroom, RN [phone]

## 2016-04-25 ENCOUNTER — Encounter (HOSPITAL_COMMUNITY): Payer: Self-pay

## 2016-04-25 ENCOUNTER — Encounter (HOSPITAL_COMMUNITY)
Admission: RE | Admit: 2016-04-25 | Discharge: 2016-04-25 | Disposition: A | Discharge status: Home / Self Care | Payer: Medicare Other | Source: Ambulatory Visit | Attending: Neurological Surgery | Admitting: Neurological Surgery

## 2016-04-25 DIAGNOSIS — M48061 Spinal stenosis, lumbar region without neurogenic claudication: Secondary | ICD-10-CM | POA: Diagnosis not present

## 2016-04-25 DIAGNOSIS — Z7982 Long term (current) use of aspirin: Secondary | ICD-10-CM | POA: Insufficient documentation

## 2016-04-25 DIAGNOSIS — I420 Dilated cardiomyopathy: Secondary | ICD-10-CM | POA: Insufficient documentation

## 2016-04-25 DIAGNOSIS — I447 Left bundle-branch block, unspecified: Secondary | ICD-10-CM | POA: Diagnosis not present

## 2016-04-25 DIAGNOSIS — Z79899 Other long term (current) drug therapy: Secondary | ICD-10-CM | POA: Diagnosis not present

## 2016-04-25 DIAGNOSIS — Z01818 Encounter for other preprocedural examination: Secondary | ICD-10-CM | POA: Diagnosis not present

## 2016-04-25 DIAGNOSIS — I5022 Chronic systolic (congestive) heart failure: Secondary | ICD-10-CM | POA: Insufficient documentation

## 2016-04-25 DIAGNOSIS — I428 Other cardiomyopathies: Secondary | ICD-10-CM | POA: Insufficient documentation

## 2016-04-25 DIAGNOSIS — Z95810 Presence of automatic (implantable) cardiac defibrillator: Secondary | ICD-10-CM | POA: Diagnosis not present

## 2016-04-25 HISTORY — DX: Heart failure, unspecified: I50.9

## 2016-04-25 HISTORY — DX: Hypothyroidism, unspecified: E03.9

## 2016-04-25 LAB — PROTIME-INR
INR: 1.01
PROTHROMBIN TIME: 13.3 s (ref 11.4–15.2)

## 2016-04-25 LAB — BASIC METABOLIC PANEL
ANION GAP: 7 (ref 5–15)
BUN: 18 mg/dL (ref 6–20)
CALCIUM: 9.7 mg/dL (ref 8.9–10.3)
CO2: 24 mmol/L (ref 22–32)
CREATININE: 0.96 mg/dL (ref 0.44–1.00)
Chloride: 106 mmol/L (ref 101–111)
GFR, EST NON AFRICAN AMERICAN: 55 mL/min — AB (ref >60)
GLUCOSE: 99 mg/dL (ref 65–99)
Potassium: 4.7 mmol/L (ref 3.5–5.1)
Sodium: 137 mmol/L (ref 135–145)

## 2016-04-25 LAB — CBC WITH DIFFERENTIAL/PLATELET
BASOS ABS: 0 10*3/uL (ref 0.0–0.1)
BASOS PCT: 1 %
EOS ABS: 0.6 10*3/uL (ref 0.0–0.7)
Eosinophils Relative: 10 %
HEMATOCRIT: 37.5 % (ref 36.0–46.0)
Hemoglobin: 12.3 g/dL (ref 12.0–15.0)
Lymphocytes Relative: 17 %
Lymphs Abs: 1 10*3/uL (ref 0.7–4.0)
MCH: 31.5 pg (ref 26.0–34.0)
MCHC: 32.8 g/dL (ref 30.0–36.0)
MCV: 95.9 fL (ref 78.0–100.0)
Monocytes Absolute: 0.6 10*3/uL (ref 0.1–1.0)
Monocytes Relative: 11 %
NEUTROS ABS: 3.4 10*3/uL (ref 1.7–7.7)
Neutrophils Relative %: 61 %
PLATELETS: 176 10*3/uL (ref 150–400)
RBC: 3.91 MIL/uL (ref 3.87–5.11)
RDW: 13.4 % (ref 11.5–15.5)
WBC: 5.5 10*3/uL (ref 4.0–10.5)

## 2016-04-25 LAB — SURGICAL PCR SCREEN
MRSA, PCR: NEGATIVE
Staphylococcus aureus: POSITIVE — AB

## 2016-04-25 MED ORDER — CHLORHEXIDINE GLUCONATE CLOTH 2 % EX PADS: 6.0000 | MEDICATED_PAD | Freq: Once | CUTANEOUS | Status: DC

## 2016-04-25 NOTE — Progress Notes (Addendum)
[facility]:EYEMVV [name] Koller, MD  Cardiologist:Dr. Tobias Alexander  EKG: 11/2015 in [facility]  Stress test: reports over 5 years ago  ECHO: 01/2015 in [place], 2016 in [place], 2015 in [facility]  Cardiac Cath:10/2013 in [facility]  C[facility]-ray: 11/2015 in [facility]

## 2016-04-25 NOTE — Pre-Procedure Instructions (Signed)
[name] [name]  04/25/2016      [facility] - [place], [place] - [address] [place] [address] Phone: [phone] Fax: [phone]  [facility] [address] - [address], [place] - [address] AT [facility] [address] [place] [address] Phone: [phone] Fax: [phone]  [facility] - [place], [place] - [address] [place] [address] Phone: [phone] Fax: [phone]    Your procedure is scheduled on Fri. Apr. 27 at 730 AM.  Report to [facility] Admitting at 530 AM.  Call this number if you have problems the morning of surgery:  [phone]   Remember:  Do not eat food or drink liquids after midnight.  Take these medicines the morning of surgery with A SIP OF WATER carvedilol (coreg), hydrocodone (norco) if need for pain, methocarbamol (robaxin)-if needed for spasms, ondansetron (zofran)-if needed for nausea, proair HFA inhaler, synthroid.  Take all other medications as prescribed except 7 days prior to surgery STOP taking any Aspirin, Aleve, Naproxen, Ibuprofen, Motrin, Advil, Goody's, BC's, all herbal medications, fish oil, and all vitamins   Do not wear jewelry, make-up or nail polish.  Do not wear lotions, powders, or perfumes, or deoderant.  Do not shave 48 hours prior to surgery.    Do not bring valuables to the hospital.  [facility] is not responsible for any belongings or valuables.  Contacts, dentures or bridgework may not be worn into surgery.  Leave your suitcase in the car.  After surgery it may be brought to your room.  For patients admitted to the hospital, discharge time will be determined by your treatment team.  Patients discharged the day of surgery will not be allowed to drive home.   Special instructions:   [facility]- Preparing For Surgery  Before surgery, you can play an important role. Because skin is  not sterile, your skin needs to be as free of germs as possible. You can reduce the number of germs on your skin by washing with CHG (chlorahexidine gluconate) Soap before surgery.  CHG is an antiseptic cleaner which kills germs and bonds with the skin to continue killing germs even after washing.  Please do not use if you have an allergy to CHG or antibacterial soaps. If your skin becomes reddened/irritated stop using the CHG.  Do not shave (including legs and underarms) for at least 48 hours prior to first CHG shower. It is OK to shave your face.  Please follow these instructions carefully.   1. Shower the NIGHT BEFORE SURGERY and the MORNING OF SURGERY with CHG.   2. If you chose to wash your hair, wash your hair first as usual with your normal shampoo.  3. After you shampoo, rinse your hair and body thoroughly to remove the shampoo.  4. Use CHG as you would any other liquid soap. You can apply CHG directly to the skin and wash gently with a scrungie or a clean washcloth.   5. Apply the CHG Soap to your body ONLY FROM THE NECK DOWN.  Do not use on open wounds or open sores. Avoid contact with your eyes, ears, mouth and genitals (private parts). Wash genitals (private parts) with your normal soap.  6. Wash thoroughly, paying special attention to the area where your surgery will be performed.  7. Thoroughly rinse your body with warm water from  the neck down.  8. DO NOT shower/wash with your normal soap after using and rinsing off the CHG Soap.  9. Pat yourself dry with a CLEAN TOWEL.   10. Wear CLEAN PAJAMAS   11. Place CLEAN SHEETS on your bed the night of your first shower and DO NOT SLEEP WITH PETS.    Day of Surgery: Do not apply any deodorants/lotions. Please wear clean clothes to the hospital/surgery center.     Please read over the following fact sheets that you were given. Pain Booklet, Coughing and Deep Breathing, MRSA Information and Surgical Site Infection  Prevention

## 2016-04-26 NOTE — Progress Notes (Signed)
Anesthesia Chart Review:  Pt is a 81 year old female scheduled for L2-3, L3-4 laminectomy and foraminotomy, possible fusion with or without instrumentation on 05/03/2016 with Marikay Alar, M.D.  - PCP is Windle Guard, MD - Cardiologist is Tobias Alexander, MD, who cleared pt for surgery at last office visit 04/11/16.  - EP cardiologist is Sherryl Manges, MD, last office visit 11/08/15 with Francis Dowse, PA.   PMH includes: CHF, nonischemic cardiomyopathy, LBBB, AICD (Biotronik CRT-D, inserted 07/04/14), hypothyroidism, asthma. Never smoker. BMI 34.  Medications include: ASA 81 mg, carvedilol, Lasix, losartan, methylphenidate, albuterol, Synthroid  Preoperative labs reviewed.  CXR 12/07/15: No acute cardiopulmonary disease.  EKG 11/08/15: Electronic ventricular pacemaker.  Echo 10/17/15: - The study is performed for AV optimization - There is grade 1 diastolic dysfunction - The optimal AV delay seems to be around 90-100 ms but i would defer to the doctors managing the pacer.   Echo 02/01/15:  - Left ventricle: The cavity size was normal. There was moderate concentric hypertrophy. Systolic function was normal. The estimated ejection fraction was in the range of 50% to 55%. Wall motion was normal; there were no regional wall motion abnormalities. Doppler parameters are consistent with abnormal left ventricular relaxation (grade 1 diastolic dysfunction). The E/e&' ratio is >15, suggesting elevated LV filling pressure. - Mitral valve: Calcified annulus. Mildly thickened leaflets. There was trivial regurgitation. - Left atrium: The atrium was normal in size. - Right ventricle: The cavity size was normal. Wall thickness was normal. Pacer wire or catheter noted in right ventricle. Systolic function was normal. - Right atrium: The atrium was normal in size. Pacer wire or catheter noted in right atrium. - Tricuspid valve: There was moderate regurgitation. - Pulmonary arteries: PA peak pressure: 30 mm Hg  (S). - Inferior vena cava: The vessel was normal in size. The respirophasic diameter changes were in the normal range (= 50%), consistent with normal central venous pressure.  Cardiac cath 11/02/13:  1. Widely patent left main coronary artery. 2. Widely patent left anterior descending artery and its branches. 3. Widely patent left circumflex artery and its branches. 4. Widely patent right coronary artery. 5. Left ventricular systolic function not assessed.  LVEDP 14 mmHg.    Perioperative prescription form for ICD notes procedure will likely interfere with device function. Unknown magnet response- call rep for reprogramming.  If no changes, I anticipate pt can proceed with surgery as scheduled.   Rica Mast, FNP-BC [facility]/Anesthesiology Phone: [phone] 04/26/2016 3:59 PM

## 2016-04-30 NOTE — Progress Notes (Addendum)
Spoke to [employer] and representative and they will have someone here for surgery between 6:30- 7am. Call [phone] to contact representative

## 2016-05-03 ENCOUNTER — Inpatient Hospital Stay (HOSPITAL_COMMUNITY)
Admission: RE | Admit: 2016-05-03 | Discharge: 2016-05-05 | DRG: 460 | Disposition: A | Discharge status: Home Health | Payer: Medicare Other | Source: Ambulatory Visit | Attending: Neurological Surgery | Admitting: Neurological Surgery

## 2016-05-03 ENCOUNTER — Inpatient Hospital Stay (HOSPITAL_COMMUNITY): Payer: Medicare Other | Admitting: Emergency Medicine

## 2016-05-03 ENCOUNTER — Inpatient Hospital Stay (HOSPITAL_COMMUNITY): Payer: Medicare Other | Admitting: Anesthesiology

## 2016-05-03 ENCOUNTER — Encounter (HOSPITAL_COMMUNITY)
Admission: RE | Disposition: A | Discharge status: Home Health | Payer: Self-pay | Source: Ambulatory Visit | Attending: Neurological Surgery

## 2016-05-03 ENCOUNTER — Encounter (HOSPITAL_COMMUNITY): Payer: Self-pay | Admitting: General Practice

## 2016-05-03 ENCOUNTER — Inpatient Hospital Stay (HOSPITAL_COMMUNITY): Payer: Medicare Other

## 2016-05-03 DIAGNOSIS — Z79899 Other long term (current) drug therapy: Secondary | ICD-10-CM | POA: Diagnosis not present

## 2016-05-03 DIAGNOSIS — Z91040 Latex allergy status: Secondary | ICD-10-CM

## 2016-05-03 DIAGNOSIS — I509 Heart failure, unspecified: Secondary | ICD-10-CM | POA: Diagnosis present

## 2016-05-03 DIAGNOSIS — Z7982 Long term (current) use of aspirin: Secondary | ICD-10-CM

## 2016-05-03 DIAGNOSIS — M48061 Spinal stenosis, lumbar region without neurogenic claudication: Secondary | ICD-10-CM | POA: Diagnosis present

## 2016-05-03 DIAGNOSIS — J45909 Unspecified asthma, uncomplicated: Secondary | ICD-10-CM | POA: Diagnosis present

## 2016-05-03 DIAGNOSIS — I447 Left bundle-branch block, unspecified: Secondary | ICD-10-CM | POA: Diagnosis present

## 2016-05-03 DIAGNOSIS — Z8349 Family history of other endocrine, nutritional and metabolic diseases: Secondary | ICD-10-CM

## 2016-05-03 DIAGNOSIS — M4316 Spondylolisthesis, lumbar region: Secondary | ICD-10-CM | POA: Diagnosis present

## 2016-05-03 DIAGNOSIS — Z90710 Acquired absence of both cervix and uterus: Secondary | ICD-10-CM | POA: Diagnosis not present

## 2016-05-03 DIAGNOSIS — Z9049 Acquired absence of other specified parts of digestive tract: Secondary | ICD-10-CM

## 2016-05-03 DIAGNOSIS — M1990 Unspecified osteoarthritis, unspecified site: Secondary | ICD-10-CM | POA: Diagnosis present

## 2016-05-03 DIAGNOSIS — Z79891 Long term (current) use of opiate analgesic: Secondary | ICD-10-CM

## 2016-05-03 DIAGNOSIS — E039 Hypothyroidism, unspecified: Secondary | ICD-10-CM | POA: Diagnosis present

## 2016-05-03 DIAGNOSIS — M79606 Pain in leg, unspecified: Secondary | ICD-10-CM | POA: Diagnosis present

## 2016-05-03 DIAGNOSIS — Z791 Long term (current) use of non-steroidal anti-inflammatories (NSAID): Secondary | ICD-10-CM | POA: Diagnosis not present

## 2016-05-03 DIAGNOSIS — M419 Scoliosis, unspecified: Secondary | ICD-10-CM | POA: Diagnosis present

## 2016-05-03 DIAGNOSIS — I420 Dilated cardiomyopathy: Secondary | ICD-10-CM | POA: Diagnosis present

## 2016-05-03 DIAGNOSIS — Z981 Arthrodesis status: Secondary | ICD-10-CM

## 2016-05-03 DIAGNOSIS — Z79890 Hormone replacement therapy (postmenopausal): Secondary | ICD-10-CM

## 2016-05-03 DIAGNOSIS — Z8249 Family history of ischemic heart disease and other diseases of the circulatory system: Secondary | ICD-10-CM

## 2016-05-03 DIAGNOSIS — Z95810 Presence of automatic (implantable) cardiac defibrillator: Secondary | ICD-10-CM | POA: Diagnosis not present

## 2016-05-03 DIAGNOSIS — Z882 Allergy status to sulfonamides status: Secondary | ICD-10-CM | POA: Diagnosis not present

## 2016-05-03 DIAGNOSIS — Z419 Encounter for procedure for purposes other than remedying health state, unspecified: Secondary | ICD-10-CM

## 2016-05-03 HISTORY — PX: POSTERIOR LUMBAR FUSION: SHX6036

## 2016-05-03 LAB — TYPE AND SCREEN
ABO/RH(D): A POS
ANTIBODY SCREEN: POSITIVE

## 2016-05-03 SURGERY — POSTERIOR LUMBAR FUSION 1 LEVEL
Anesthesia: General | Site: Spine Lumbar

## 2016-05-03 MED ORDER — LIDOCAINE 2% (20 MG/ML) 5 ML SYRINGE
INTRAMUSCULAR | Status: AC
  Filled 2016-05-03: qty 5

## 2016-05-03 MED ORDER — SODIUM CHLORIDE 0.9 % IR SOLN
Status: DC | PRN
  Administered 2016-05-03: 09:00:00

## 2016-05-03 MED ORDER — ASPIRIN EC 81 MG PO TBEC
81.0000 mg | DELAYED_RELEASE_TABLET | Freq: Every day | ORAL | Status: DC
  Administered 2016-05-03 – 2016-05-05 (×3): 81 mg via ORAL
  Filled 2016-05-03 (×3): qty 1

## 2016-05-03 MED ORDER — SODIUM CHLORIDE 0.9% FLUSH: 3.0000 mL | INTRAVENOUS | Status: DC | PRN

## 2016-05-03 MED ORDER — THROMBIN 20000 UNITS EX SOLR
CUTANEOUS | Status: DC | PRN
  Administered 2016-05-03: 08:00:00 via TOPICAL

## 2016-05-03 MED ORDER — BUPIVACAINE HCL (PF) 0.25 % IJ SOLN
INTRAMUSCULAR | Status: DC | PRN
  Administered 2016-05-03: 7 mL

## 2016-05-03 MED ORDER — ACETAMINOPHEN 650 MG RE SUPP: 650.0000 mg | RECTAL | Status: DC | PRN

## 2016-05-03 MED ORDER — MORPHINE SULFATE (PF) 4 MG/ML IV SOLN
2.0000 mg | INTRAVENOUS | Status: DC | PRN
  Administered 2016-05-03 – 2016-05-05 (×8): 2 mg via INTRAVENOUS
  Filled 2016-05-03 (×9): qty 1

## 2016-05-03 MED ORDER — FENTANYL CITRATE (PF) 250 MCG/5ML IJ SOLN
INTRAMUSCULAR | Status: AC
  Filled 2016-05-03: qty 5

## 2016-05-03 MED ORDER — SENNA 8.6 MG PO TABS
1.0000 | ORAL_TABLET | Freq: Two times a day (BID) | ORAL | Status: DC
  Administered 2016-05-03 – 2016-05-05 (×4): 8.6 mg via ORAL
  Filled 2016-05-03 (×4): qty 1

## 2016-05-03 MED ORDER — SCOPOLAMINE 1 MG/3DAYS TD PT72
MEDICATED_PATCH | TRANSDERMAL | Status: DC | PRN
  Administered 2016-05-03: 1 via TRANSDERMAL

## 2016-05-03 MED ORDER — FENTANYL CITRATE (PF) 100 MCG/2ML IJ SOLN
INTRAMUSCULAR | Status: AC
  Administered 2016-05-03: 100 ug via INTRAVENOUS
  Filled 2016-05-03: qty 2

## 2016-05-03 MED ORDER — FENTANYL CITRATE (PF) 100 MCG/2ML IJ SOLN
INTRAMUSCULAR | Status: DC | PRN
  Administered 2016-05-03 (×2): 50 ug via INTRAVENOUS
  Administered 2016-05-03: 100 ug via INTRAVENOUS
  Administered 2016-05-03 (×3): 50 ug via INTRAVENOUS

## 2016-05-03 MED ORDER — CEFAZOLIN SODIUM-DEXTROSE 2-4 GM/100ML-% IV SOLN
2.0000 g | INTRAVENOUS | Status: AC
  Administered 2016-05-03: 2 g via INTRAVENOUS
  Filled 2016-05-03: qty 100

## 2016-05-03 MED ORDER — FENTANYL CITRATE (PF) 100 MCG/2ML IJ SOLN
25.0000 ug | INTRAMUSCULAR | Status: DC | PRN
  Administered 2016-05-03: 100 ug via INTRAVENOUS

## 2016-05-03 MED ORDER — PHENYLEPHRINE 40 MCG/ML (10ML) SYRINGE FOR IV PUSH (FOR BLOOD PRESSURE SUPPORT)
PREFILLED_SYRINGE | INTRAVENOUS | Status: AC
  Filled 2016-05-03: qty 10

## 2016-05-03 MED ORDER — PHENOL 1.4 % MT LIQD
1.0000 | OROMUCOSAL | Status: DC | PRN
Start: 2016-05-03 — End: 2016-05-05

## 2016-05-03 MED ORDER — OXYCODONE HCL 5 MG/5ML PO SOLN: 5.0000 mg | Freq: Once | ORAL | Status: DC | PRN

## 2016-05-03 MED ORDER — LIDOCAINE HCL (CARDIAC) 20 MG/ML IV SOLN
INTRAVENOUS | Status: DC | PRN
  Administered 2016-05-03: 40 mg via INTRAVENOUS

## 2016-05-03 MED ORDER — POTASSIUM CHLORIDE IN NACL 20-0.9 MEQ/L-% IV SOLN
INTRAVENOUS | Status: DC
  Administered 2016-05-03 – 2016-05-04 (×2): via INTRAVENOUS
  Filled 2016-05-03 (×3): qty 1000

## 2016-05-03 MED ORDER — MENTHOL 3 MG MT LOZG
1.0000 | LOZENGE | OROMUCOSAL | Status: DC | PRN
Start: 2016-05-03 — End: 2016-05-05

## 2016-05-03 MED ORDER — LACTATED RINGERS IV SOLN
INTRAVENOUS | Status: DC | PRN
  Administered 2016-05-03 (×2): via INTRAVENOUS

## 2016-05-03 MED ORDER — EPHEDRINE 5 MG/ML INJ
INTRAVENOUS | Status: AC
Start: 2016-05-03 — End: 2016-05-03
  Filled 2016-05-03: qty 10

## 2016-05-03 MED ORDER — FENTANYL CITRATE (PF) 100 MCG/2ML IJ SOLN
INTRAMUSCULAR | Status: AC
  Administered 2016-05-03: 50 ug
  Filled 2016-05-03: qty 2

## 2016-05-03 MED ORDER — METHOCARBAMOL 500 MG PO TABS
ORAL_TABLET | ORAL | Status: AC
  Administered 2016-05-03: 750 mg via ORAL
  Filled 2016-05-03: qty 2

## 2016-05-03 MED ORDER — 0.9 % SODIUM CHLORIDE (POUR BTL) OPTIME
TOPICAL | Status: DC | PRN
  Administered 2016-05-03: 1000 mL

## 2016-05-03 MED ORDER — BUPIVACAINE HCL (PF) 0.25 % IJ SOLN
INTRAMUSCULAR | Status: AC
  Filled 2016-05-03: qty 30

## 2016-05-03 MED ORDER — ONDANSETRON HCL 4 MG PO TABS: 4.0000 mg | ORAL_TABLET | Freq: Four times a day (QID) | ORAL | Status: DC | PRN

## 2016-05-03 MED ORDER — ALBUTEROL SULFATE (2.5 MG/3ML) 0.083% IN NEBU
3.0000 mL | INHALATION_SOLUTION | RESPIRATORY_TRACT | Status: DC | PRN
  Administered 2016-05-04: 3 mL via RESPIRATORY_TRACT
  Filled 2016-05-03: qty 3

## 2016-05-03 MED ORDER — SODIUM CHLORIDE 0.9 % IV SOLN: 250.0000 mL | INTRAVENOUS | Status: DC

## 2016-05-03 MED ORDER — ONDANSETRON HCL 4 MG/2ML IJ SOLN: 4.0000 mg | Freq: Once | INTRAMUSCULAR | Status: DC | PRN

## 2016-05-03 MED ORDER — ONDANSETRON HCL 4 MG/2ML IJ SOLN: 4.0000 mg | Freq: Four times a day (QID) | INTRAMUSCULAR | Status: DC | PRN

## 2016-05-03 MED ORDER — CARVEDILOL 12.5 MG PO TABS
12.5000 mg | ORAL_TABLET | Freq: Two times a day (BID) | ORAL | Status: DC
  Administered 2016-05-03 – 2016-05-04 (×3): 12.5 mg via ORAL
  Filled 2016-05-03 (×3): qty 1

## 2016-05-03 MED ORDER — THROMBIN 5000 UNITS EX SOLR
OROMUCOSAL | Status: DC | PRN
  Administered 2016-05-03: 08:00:00 via TOPICAL

## 2016-05-03 MED ORDER — FUROSEMIDE 20 MG PO TABS: 20.0000 mg | ORAL_TABLET | Freq: Every day | ORAL | Status: DC | PRN

## 2016-05-03 MED ORDER — PHENYLEPHRINE HCL 10 MG/ML IJ SOLN
INTRAVENOUS | Status: DC | PRN
  Administered 2016-05-03: 30 ug/min via INTRAVENOUS

## 2016-05-03 MED ORDER — ONDANSETRON HCL 4 MG/2ML IJ SOLN
INTRAMUSCULAR | Status: AC
  Filled 2016-05-03: qty 2

## 2016-05-03 MED ORDER — METHOCARBAMOL 750 MG PO TABS
750.0000 mg | ORAL_TABLET | Freq: Three times a day (TID) | ORAL | Status: DC | PRN
  Administered 2016-05-03 – 2016-05-05 (×5): 750 mg via ORAL
  Filled 2016-05-03 (×4): qty 1

## 2016-05-03 MED ORDER — METHYLPHENIDATE HCL 5 MG PO TABS
20.0000 mg | ORAL_TABLET | Freq: Two times a day (BID) | ORAL | Status: DC
  Administered 2016-05-04 – 2016-05-05 (×3): 20 mg via ORAL
  Filled 2016-05-03 (×4): qty 4

## 2016-05-03 MED ORDER — HYDROCODONE-ACETAMINOPHEN 5-325 MG PO TABS
1.0000 | ORAL_TABLET | Freq: Four times a day (QID) | ORAL | Status: DC | PRN
  Administered 2016-05-03 – 2016-05-05 (×8): 1 via ORAL
  Filled 2016-05-03 (×8): qty 1

## 2016-05-03 MED ORDER — PROPOFOL 10 MG/ML IV BOLUS
INTRAVENOUS | Status: DC | PRN
  Administered 2016-05-03: 140 mg via INTRAVENOUS

## 2016-05-03 MED ORDER — LOSARTAN POTASSIUM 25 MG PO TABS
25.0000 mg | ORAL_TABLET | Freq: Every day | ORAL | Status: DC
  Administered 2016-05-04: 25 mg via ORAL
  Filled 2016-05-03 (×3): qty 1

## 2016-05-03 MED ORDER — ROCURONIUM BROMIDE 10 MG/ML (PF) SYRINGE
PREFILLED_SYRINGE | INTRAVENOUS | Status: AC
  Filled 2016-05-03: qty 5

## 2016-05-03 MED ORDER — PHENYLEPHRINE HCL 10 MG/ML IJ SOLN
INTRAMUSCULAR | Status: DC | PRN
  Administered 2016-05-03: 80 ug via INTRAVENOUS
  Administered 2016-05-03: 50 ug via INTRAVENOUS
  Administered 2016-05-03: 80 ug via INTRAVENOUS
  Administered 2016-05-03: 120 ug via INTRAVENOUS
  Administered 2016-05-03: 80 ug via INTRAVENOUS

## 2016-05-03 MED ORDER — CEFAZOLIN SODIUM-DEXTROSE 2-4 GM/100ML-% IV SOLN
2.0000 g | Freq: Three times a day (TID) | INTRAVENOUS | Status: AC
  Administered 2016-05-03 – 2016-05-04 (×2): 2 g via INTRAVENOUS
  Filled 2016-05-03 (×2): qty 100

## 2016-05-03 MED ORDER — SODIUM CHLORIDE 0.9% FLUSH
3.0000 mL | Freq: Two times a day (BID) | INTRAVENOUS | Status: DC
  Administered 2016-05-04 – 2016-05-05 (×2): 3 mL via INTRAVENOUS

## 2016-05-03 MED ORDER — OXYCODONE HCL 5 MG PO TABS
ORAL_TABLET | ORAL | Status: AC
  Administered 2016-05-03: 5 mg via ORAL
  Filled 2016-05-03: qty 1

## 2016-05-03 MED ORDER — ACETAMINOPHEN 325 MG PO TABS
650.0000 mg | ORAL_TABLET | ORAL | Status: DC | PRN
  Administered 2016-05-05: 325 mg via ORAL
  Filled 2016-05-03: qty 2

## 2016-05-03 MED ORDER — THROMBIN 5000 UNITS EX SOLR
CUTANEOUS | Status: AC
  Filled 2016-05-03: qty 5000

## 2016-05-03 MED ORDER — THROMBIN 20000 UNITS EX SOLR
CUTANEOUS | Status: AC
Start: 2016-05-03 — End: 2016-05-03
  Filled 2016-05-03: qty 20000

## 2016-05-03 MED ORDER — SUCCINYLCHOLINE CHLORIDE 200 MG/10ML IV SOSY
PREFILLED_SYRINGE | INTRAVENOUS | Status: AC
  Filled 2016-05-03: qty 10

## 2016-05-03 MED ORDER — ROCURONIUM BROMIDE 100 MG/10ML IV SOLN
INTRAVENOUS | Status: DC | PRN
  Administered 2016-05-03: 50 mg via INTRAVENOUS

## 2016-05-03 MED ORDER — DEXAMETHASONE SODIUM PHOSPHATE 10 MG/ML IJ SOLN
10.0000 mg | INTRAMUSCULAR | Status: AC
  Administered 2016-05-03: 160 mg via INTRAVENOUS
  Administered 2016-05-03: 10 mg via INTRAVENOUS
  Filled 2016-05-03: qty 1

## 2016-05-03 MED ORDER — OXYCODONE HCL 5 MG PO TABS
5.0000 mg | ORAL_TABLET | Freq: Once | ORAL | Status: DC | PRN
  Administered 2016-05-03: 5 mg via ORAL

## 2016-05-03 MED ORDER — SUGAMMADEX SODIUM 200 MG/2ML IV SOLN
INTRAVENOUS | Status: AC
  Filled 2016-05-03: qty 2

## 2016-05-03 MED ORDER — PROPOFOL 10 MG/ML IV BOLUS
INTRAVENOUS | Status: AC
  Filled 2016-05-03: qty 20

## 2016-05-03 MED ORDER — LEVOTHYROXINE SODIUM 112 MCG PO TABS
112.0000 ug | ORAL_TABLET | Freq: Every day | ORAL | Status: DC
  Administered 2016-05-04 – 2016-05-05 (×2): 112 ug via ORAL
  Filled 2016-05-03 (×2): qty 1

## 2016-05-03 MED ORDER — MORPHINE SULFATE (PF) 2 MG/ML IV SOLN: 2.0000 mg | INTRAVENOUS | Status: DC | PRN

## 2016-05-03 MED ORDER — ONDANSETRON HCL 4 MG/2ML IJ SOLN
INTRAMUSCULAR | Status: DC | PRN
  Administered 2016-05-03: 4 mg via INTRAVENOUS

## 2016-05-03 SURGICAL SUPPLY — 64 items
ADH SKN CLS APL DERMABOND .7 (GAUZE/BANDAGES/DRESSINGS) ×1
APL SKNCLS STERI-STRIP NONHPOA (GAUZE/BANDAGES/DRESSINGS) ×1
BAG DECANTER FOR FLEXI CONT (MISCELLANEOUS) ×3 IMPLANT
BASKET BONE COLLECTION (BASKET) ×5 IMPLANT
BENZOIN TINCTURE PRP APPL 2/3 (GAUZE/BANDAGES/DRESSINGS) ×3 IMPLANT
BLADE CLIPPER SURG (BLADE) IMPLANT
BONE CANC CHIPS 20CC PCAN1/4 (Bone Implant) ×3 IMPLANT
BUR MATCHSTICK NEURO 3.0 LAGG (BURR) ×3 IMPLANT
CANISTER SUCT 3000ML PPV (MISCELLANEOUS) ×3 IMPLANT
CARTRIDGE OIL MAESTRO DRILL (MISCELLANEOUS) ×1 IMPLANT
CHIPS CANC BONE 20CC PCAN1/4 (Bone Implant) ×1 IMPLANT
CLOSURE WOUND 1/2 X4 (GAUZE/BANDAGES/DRESSINGS) ×1
CONT SPEC 4OZ CLIKSEAL STRL BL (MISCELLANEOUS) ×3 IMPLANT
COVER BACK TABLE 60X90IN (DRAPES) ×3 IMPLANT
DERMABOND ADVANCED (GAUZE/BANDAGES/DRESSINGS) ×2
DERMABOND ADVANCED .7 DNX12 (GAUZE/BANDAGES/DRESSINGS) IMPLANT
DIFFUSER DRILL AIR PNEUMATIC (MISCELLANEOUS) ×3 IMPLANT
DRAPE C-ARM 42X72 X-RAY (DRAPES) ×6 IMPLANT
DRAPE LAPAROTOMY 100X72X124 (DRAPES) ×3 IMPLANT
DRAPE POUCH INSTRU U-SHP 10X18 (DRAPES) ×3 IMPLANT
DRAPE SURG 17X23 STRL (DRAPES) ×3 IMPLANT
DRSG OPSITE 4X5.5 SM (GAUZE/BANDAGES/DRESSINGS) ×2 IMPLANT
DRSG OPSITE POSTOP 4X6 (GAUZE/BANDAGES/DRESSINGS) ×2 IMPLANT
DURAPREP 26ML APPLICATOR (WOUND CARE) ×3 IMPLANT
ELECT REM PT RETURN 9FT ADLT (ELECTROSURGICAL) ×3
ELECTRODE REM PT RTRN 9FT ADLT (ELECTROSURGICAL) ×1 IMPLANT
EVACUATOR 1/8 PVC DRAIN (DRAIN) ×3 IMPLANT
GAUZE SPONGE 4X4 16PLY XRAY LF (GAUZE/BANDAGES/DRESSINGS) IMPLANT
GLOVE BIO SURGEON STRL SZ7 (GLOVE) IMPLANT
GLOVE BIO SURGEON STRL SZ8 (GLOVE) ×6 IMPLANT
GLOVE BIOGEL PI IND STRL 7.0 (GLOVE) IMPLANT
GLOVE BIOGEL PI IND STRL 7.5 (GLOVE) IMPLANT
GLOVE BIOGEL PI INDICATOR 7.0 (GLOVE)
GLOVE BIOGEL PI INDICATOR 7.5 (GLOVE) ×2
GLOVE OPTIFIT SS 7.0 STRL BRWN (GLOVE) ×1
GLOVE OPTIFIT SS 7.5 STRL LX (GLOVE) ×1 IMPLANT
GLOVE SURG SS PI 7.5 STRL IVOR (GLOVE) ×2 IMPLANT
GOWN STRL REUS W/ TWL LRG LVL3 (GOWN DISPOSABLE) IMPLANT
GOWN STRL REUS W/ TWL XL LVL3 (GOWN DISPOSABLE) ×2 IMPLANT
GOWN STRL REUS W/TWL 2XL LVL3 (GOWN DISPOSABLE) IMPLANT
GOWN STRL REUS W/TWL LRG LVL3 (GOWN DISPOSABLE) ×3
GOWN STRL REUS W/TWL XL LVL3 (GOWN DISPOSABLE) ×6
GRAFT BNE CANC CHIPS 1-8 20CC (Bone Implant) IMPLANT
HEMOSTAT POWDER KIT SURGIFOAM (HEMOSTASIS) ×2 IMPLANT
KIT BASIN OR (CUSTOM PROCEDURE TRAY) ×3 IMPLANT
KIT ROOM TURNOVER OR (KITS) ×3 IMPLANT
NDL HYPO 25X1 1.5 SAFETY (NEEDLE) ×1 IMPLANT
NEEDLE HYPO 25X1 1.5 SAFETY (NEEDLE) ×3 IMPLANT
NS IRRIG 1000ML POUR BTL (IV SOLUTION) ×3 IMPLANT
OIL CARTRIDGE MAESTRO DRILL (MISCELLANEOUS) ×3
PACK LAMINECTOMY NEURO (CUSTOM PROCEDURE TRAY) ×3 IMPLANT
PAD ARMBOARD 7.5X6 YLW CONV (MISCELLANEOUS) ×9 IMPLANT
PUTTY BONE ATTRAX 5CC STRIP (Putty) ×2 IMPLANT
SPONGE LAP 4X18 X RAY DECT (DISPOSABLE) IMPLANT
SPONGE SURGIFOAM ABS GEL 100 (HEMOSTASIS) ×3 IMPLANT
STRIP CLOSURE SKIN 1/2X4 (GAUZE/BANDAGES/DRESSINGS) ×3 IMPLANT
SUT VIC AB 0 CT1 18XCR BRD8 (SUTURE) ×1 IMPLANT
SUT VIC AB 0 CT1 8-18 (SUTURE) ×3
SUT VIC AB 2-0 CP2 18 (SUTURE) ×3 IMPLANT
SUT VIC AB 3-0 SH 8-18 (SUTURE) ×6 IMPLANT
TOWEL GREEN STERILE (TOWEL DISPOSABLE) ×2 IMPLANT
TOWEL GREEN STERILE FF (TOWEL DISPOSABLE) ×3 IMPLANT
TRAY FOLEY W/METER SILVER 16FR (SET/KITS/TRAYS/PACK) ×3 IMPLANT
WATER STERILE IRR 1000ML POUR (IV SOLUTION) ×3 IMPLANT

## 2016-05-03 NOTE — Progress Notes (Deleted)
PT Cancellation Note  Patient Details Name: BRENTNEY ALHASSAN MRN: [id number] DOB: [birth year]   Cancelled Treatment:    Reason Eval/Treat Not Completed: Patient not medically ready.  Patient had surgery today. Will return tomorrow for PT evaluation.   Vena Austria 05/03/2016, 5:10 PM Durenda Hurt. Renaldo Fiddler, [facility] Pager [phone]

## 2016-05-03 NOTE — H&P (Signed)
Subjective: Patient is a 81 y.o. female admitted for leg pain. Onset of symptoms was several months ago, gradually worsening since that time.  The pain is rated severe, unremitting, and is located at the across the lower back and radiates to legs. The pain is described as aching and occurs all day. The symptoms have been progressive. Symptoms are exacerbated by exercise. MRI or CT showed stenosis with instability   Past Medical History:  Diagnosis Date  . AICD (automatic cardioverter/defibrillator) present   . Angina effort (HCC)   . Arthritis   . Asthma   . CHF (congestive heart failure) (HCC)   . Congestive dilated cardiomyopathy (HCC) 03/31/2014  . Hypothyroidism   . LBBB (left bundle branch block) 10/30/2013  . Left bundle branch block   . Pancreatitis, acute   . Presence of permanent cardiac pacemaker   . Thyroid disease     Past Surgical History:  Procedure Laterality Date  . ABDOMINAL HYSTERECTOMY    . CARDIAC DEFIBRILLATOR PLACEMENT    . CHOLECYSTECTOMY    . EP IMPLANTABLE DEVICE N/A 07/04/2014   Procedure: BiV ICD Insertion CRT-D;  Surgeon: Duke Salvia, MD;  Location: [facility] INVASIVE CV LAB;  Service: Cardiovascular;  Laterality: N/A;  . HERNIA REPAIR    . LEFT HEART CATHETERIZATION WITH CORONARY ANGIOGRAM N/A 11/02/2013   Procedure: LEFT HEART CATHETERIZATION WITH CORONARY ANGIOGRAM;  Surgeon: Corky Crafts, MD;  Location: [facility] CATH LAB;  Service: Cardiovascular;  Laterality: N/A;  . LOOP RECORDER IMPLANT N/A 04/13/2014   Procedure: LOOP RECORDER IMPLANT;  Surgeon: Duke Salvia, MD;  Location: [facility] CATH LAB;  Service: Cardiovascular;  Laterality: N/A;  . PACEMAKER INSERTION      Prior to Admission medications   Medication Sig Start Date End Date Taking? Authorizing Provider  aspirin EC 81 MG tablet Take 1 tablet (81 mg total) by mouth daily. Patient taking differently: Take 81 mg by mouth every evening.  10/29/13  Yes Lars Masson, MD   aspirin-acetaminophen-caffeine (EXCEDRIN MIGRAINE) [phone] MG per tablet Take 1 tablet by mouth every 6 (six) hours as needed for headache or migraine.   Yes Historical Provider, MD  calcium carbonate (TUMS - DOSED IN MG ELEMENTAL CALCIUM) 500 MG chewable tablet Chew 2 tablets by mouth 2 (two) times daily as needed for indigestion or heartburn.   Yes Historical Provider, MD  carvedilol (COREG) 12.5 MG tablet Take 1 tablet (12.5 mg total) by mouth 2 (two) times daily. 12/19/15  Yes Lars Masson, MD  furosemide (LASIX) 20 MG tablet Take 1 tablet (20 mg total) by mouth daily as needed for edema. Patient taking differently: Take 20 mg by mouth daily as needed for fluid or edema.  09/07/15  Yes Lars Masson, MD  HYDROcodone-acetaminophen (NORCO/VICODIN) 5-325 MG tablet Take 1 tablet by mouth every 6 (six) hours as needed for moderate pain.   Yes Historical Provider, MD  losartan (COZAAR) 25 MG tablet Take 1 tablet (25 mg total) by mouth daily. 12/07/15 04/23/17 Yes Lars Masson, MD  Melatonin 5 MG TABS Take 5 mg by mouth at bedtime as needed (for sleep.).   Yes Historical Provider, MD  methocarbamol (ROBAXIN) 750 MG tablet Take 750 mg by mouth 2 (two) times daily as needed. For leg spasms. 02/07/16  Yes Historical Provider, MD  methylphenidate (RITALIN) 20 MG tablet Take 20 mg by mouth 2 (two) times daily as needed. For narcolepsy 02/22/16  Yes Historical Provider, MD  Naphazoline-Pheniramine (ALLERGY EYE OP) Place 1-2  drops into both eyes daily as needed (for irritated eyes).   Yes Historical Provider, MD  ondansetron (ZOFRAN) 4 MG tablet Take 0.5 tablets (2 mg total) by mouth every 8 (eight) hours as needed for nausea or vomiting. Patient taking differently: Take 4 mg by mouth every 8 (eight) hours as needed for nausea or vomiting.  12/19/15  Yes Lars Masson, MD  PROAIR HFA 108 (90 BASE) MCG/ACT inhaler Inhale 1 puff into the lungs every 4 (four) hours as needed for wheezing or  shortness of breath.  09/08/13  Yes Historical Provider, MD  Probiotic Product (PROBIOTIC PO) Take 1 capsule by mouth every evening.   Yes Historical Provider, MD  SYNTHROID 112 MCG tablet Take 112 mcg by mouth daily. 03/05/16  Yes Historical Provider, MD  amoxicillin (AMOXIL) 500 MG capsule Take 500 mg by mouth See admin instructions. TAKE 4 CAPSULES BY MOUTH  1 HOUR PRIOR TO DENTAL APPOINTMENT 10/10/15   Historical Provider, MD  betamethasone dipropionate 0.05 % lotion Apply 1 application topically daily as needed (for toes/fingers.).     Historical Provider, MD  Nortriptyline HCl (NORTRIPYTLINE HCL PO) Take 1 capsule by mouth at bedtime.    Historical Provider, MD   Allergies  Allergen Reactions  . Sulfa Antibiotics Hives  . Latex Itching    Social History  Substance Use Topics  . Smoking status: Never Smoker  . Smokeless tobacco: Never Used  . Alcohol use Yes     Comment: occassionally    Family History  Problem Relation Age of Onset  . Hyperlipidemia Mother   . Arrhythmia Mother     palpitations  . Heart disease Father   . Heart attack Father   . Heart attack Maternal Grandfather   . Heart attack Paternal Grandfather      Review of Systems  Positive ROS: neg  All other systems have been reviewed and were otherwise negative with the exception of those mentioned in the HPI and as above.  Objective: Vital signs in last 24 hours: Temp:  [97.5 F (36.4 C)] 97.5 F (36.4 C) (04/27 0615) Pulse Rate:  [74] 74 (04/27 0615) Resp:  [18] 18 (04/27 0615) BP: (136)/(63) 136/63 (04/27 0615) SpO2:  [98 %] 98 % (04/27 0615) Weight:  [76.3 kg (168 lb 3.2 oz)] 76.3 kg (168 lb 3.2 oz) (04/27 5997)  General Appearance: Alert, cooperative, no distress, appears stated age Head: Normocephalic, without obvious abnormality, atraumatic Eyes: PERRL, conjunctiva/corneas clear, EOM's intact    Neck: Supple, symmetrical, trachea midline Back: Symmetric, no curvature, ROM normal, no CVA  tenderness Lungs:  respirations unlabored Heart: Regular rate and rhythm Abdomen: Soft, non-tender Extremities: Extremities normal, atraumatic, no cyanosis or edema Pulses: 2+ and symmetric all extremities Skin: Skin color, texture, turgor normal, no rashes or lesions  NEUROLOGIC:   Mental status: Alert and oriented x4,  no aphasia, good attention span, fund of knowledge, and memory Motor Exam - grossly normal Sensory Exam - grossly normal Reflexes: 1+ Coordination - grossly normal Gait - grossly normal Balance - grossly normal Cranial Nerves: I: smell Not tested  II: visual acuity  OS: nl    OD: nl  II: visual fields Full to confrontation  II: pupils Equal, round, reactive to light  III,VII: ptosis None  III,IV,VI: extraocular muscles  Full ROM  V: mastication Normal  V: facial light touch sensation  Normal  V,VII: corneal reflex  Present  VII: facial muscle function - upper  Normal  VII: facial muscle function -  lower Normal  VIII: hearing Not tested  IX: soft palate elevation  Normal  IX,X: gag reflex Present  XI: trapezius strength  5/5  XI: sternocleidomastoid strength 5/5  XI: neck flexion strength  5/5  XII: tongue strength  Normal    Data Review Lab Results  Component Value Date   WBC 5.5 04/25/2016   HGB 12.3 04/25/2016   HCT 37.5 04/25/2016   MCV 95.9 04/25/2016   PLT 176 04/25/2016   Lab Results  Component Value Date   NA 137 04/25/2016   K 4.7 04/25/2016   CL 106 04/25/2016   CO2 24 04/25/2016   BUN 18 04/25/2016   CREATININE 0.96 04/25/2016   GLUCOSE 99 04/25/2016   Lab Results  Component Value Date   INR 1.01 04/25/2016    Assessment/Plan: Patient admitted for laminectomy/ fusion for stenosis/ instability. Patient has failed a reasonable attempt at conservative therapy.  I explained the condition and procedure to the patient and answered any questions.  Patient wishes to proceed with procedure as planned. Understands risks/ benefits and  typical outcomes of procedure.   Krystall Kruckenberg S 05/03/2016 6:29 AM

## 2016-05-03 NOTE — Evaluation (Signed)
Physical Therapy Evaluation Patient Details Name: [name] [name] MRN: [id number] DOB: 1935/07/15 Today's Date: 05/03/2016   History of Present Illness  pt is a 82 y/o female with pmh of CHF, AICD/pacer, CM, admitted with radicular leg pain not helped by conservative treatment, s/p L2-4 decompression and lateral fusion with bone grafting.  Clinical Impression  Pt admitted with/for lumbar fusion surgery.  Pt needing moderate assist for basic mobility as of evaluation and was not ready to ambulate today..  Pt currently limited functionally due to the problems listed below.  (see problems list.)  Pt will benefit from PT to maximize function and safety to be able to get home safely with available assist of family.     Follow Up Recommendations No PT follow up;Supervision for mobility/OOB    Equipment Recommendations       Recommendations for Other Services       Precautions / Restrictions Precautions Precautions: Fall;Back      Mobility  Bed Mobility Overal bed mobility: Needs Assistance Bed Mobility: Rolling;Sidelying to Sit Rolling: Min assist Sidelying to sit: Mod assist       General bed mobility comments: cues for technique and truncal assist  Transfers Overall transfer level: Needs assistance   Transfers: Sit to/from Stand;Stand Pivot Transfers Sit to Stand: Mod assist;+2 safety/equipment Stand pivot transfers: Mod assist;+2 safety/equipment       General transfer comment: cues for hand placement, assist to come up and forward.  pivot in the RW effortful with need for stability and to maueuver RW  Ambulation/Gait             General Gait Details: unable today due to pain  Stairs            Wheelchair Mobility    Modified Rankin (Stroke Patients Only)       Balance Overall balance assessment: Needs assistance   Sitting balance-Leahy Scale: Fair       Standing balance-Leahy Scale: Poor                                Pertinent Vitals/Pain Pain Assessment: Faces Faces Pain Scale: Hurts whole lot Pain Location: back Pain Descriptors / Indicators: Grimacing;Guarding;Moaning Pain Intervention(s): Monitored during session;Repositioned;Limited activity within patient's tolerance    Home Living Family/patient expects to be discharged to:: Private residence Living Arrangements: Spouse/significant other Available Help at Discharge: Family;Available 24 hours/day Type of Home: House Home Access: Stairs to enter   [employer] of Steps: 1 little step Home Layout: Multi-level;Able to live on main level with bedroom/bathroom Home Equipment: Shower seat;Walker - 2 wheels (riser with arms)      Prior Function Level of Independence: Independent with assistive device(s)         Comments: was much more mobile more than a month ago, per pt.     Hand Dominance        Extremity/Trunk Assessment   Upper Extremity Assessment Upper Extremity Assessment: Defer to OT evaluation    Lower Extremity Assessment Lower Extremity Assessment: Generalized weakness       Communication   Communication: No difficulties  Cognition Arousal/Alertness: Awake/alert;Lethargic Behavior During Therapy: WFL for tasks assessed/performed Overall Cognitive Status: Within Functional Limits for tasks assessed (but "loopy" with sedation)  General Comments General comments (skin integrity, edema, etc.): Instructed pt/daughter in back care/prec, log roll/transitions to /from sit, lifting restrictions, and progression of activity.    Exercises     Assessment/Plan    PT Assessment Patient needs continued PT services  PT Problem List Decreased strength;Decreased activity tolerance;Decreased mobility;Decreased knowledge of use of DME;Decreased knowledge of precautions;Pain       PT Treatment Interventions Gait training;Functional mobility  training;Therapeutic activities;Patient/family education;DME instruction    PT Goals (Current goals can be found in the Care Plan section)  Acute Rehab PT Goals PT Goal Formulation: With patient Time For Goal Achievement: 05/10/16 Potential to Achieve Goals: Good    Frequency Min 5X/week   Barriers to discharge        Co-evaluation               End of Session   Activity Tolerance: Patient limited by pain;Patient limited by lethargy Patient left: in bed;with call bell/phone within reach;with bed alarm set;with family/visitor present Nurse Communication: Mobility status PT Visit Diagnosis: Unsteadiness on feet (R26.81);Muscle weakness (generalized) (M62.81);Other abnormalities of gait and mobility (R26.89)    Time: 1711-1753 PT Time Calculation (min) (ACUTE ONLY): 42 min   Charges:   PT Evaluation $PT Eval Moderate Complexity: 1 Procedure PT Treatments $Therapeutic Activity: 8-22 mins $Self Care/Home Management: 8-22   PT G Codes:        26-May-2016  [place] Bing, PT [phone]  (pager)  Eliseo Gum Ladeja Pelham 05/26/16, 6:59 PM

## 2016-05-03 NOTE — Transfer of Care (Signed)
Immediate Anesthesia Transfer of Care Note  Patient: [name] [name]  Procedure(s) Performed: Procedure(s): Lumbar Two-Three, Lumbar Three-Four Posterior Lumbar Fusion withLaminectomy and Foraminotomy (N/A)  Patient Location: PACU  Anesthesia Type:General  Level of Consciousness: awake, alert , oriented and patient cooperative  Airway & Oxygen Therapy: Patient Spontanous Breathing  Post-op Assessment: Report given to RN and Post -op Vital signs reviewed and stable  Post vital signs: Reviewed and stable  Last Vitals:  Vitals:   05/03/16 0615  BP: 136/63  Pulse: 74  Resp: 18  Temp: 36.4 C    Last Pain:  Vitals:   05/03/16 0615  TempSrc: Oral  PainSc:       Patients Stated Pain Goal: 4 (05/03/16 1751)  Complications: No apparent anesthesia complications

## 2016-05-03 NOTE — Op Note (Signed)
05/03/2016  10:40 AM  PATIENT:  [name] [name]  81 y.o. female  PRE-OPERATIVE DIAGNOSIS:  Severe scoliosis with severe spinal stenosis, lateral listhesis and spondylolisthesis, back and leg pain  POST-OPERATIVE DIAGNOSIS:  same  PROCEDURE:  1. Decompressive lumbar laminectomy, medial facetectomy and foraminotomies at L2-3 L3-4 bilaterally, 2. Lateral fusion L2-L4 inclusive utilizing locally harvested morselized autologous bone graft mixed with morcellized allograft  SURGEON:  Marikay Alar, MD  ASSISTANTS: Ditty MD  ANESTHESIA:   General  EBL: 250 ml  Total I/O In: 1000 [I.V.:1000] Out: 590 [Urine:340; Blood:250]  BLOOD ADMINISTERED: none  DRAINS: med hemovac  SPECIMEN:  none  INDICATION FOR PROCEDURE: This patient presented with severe back and leg pain. Imaging showed severe scoliosis with severe spinal stenosis L2-3 and L3-4 with spondylolisthesis and lateral listhesis at L3-4. The patient tried conservative measures without relief. Pain was debilitating. Recommended decompressive laminectomy and non-instrumented fusion. Patient understood the risks, benefits, and alternatives and potential outcomes and wished to proceed.  PROCEDURE DETAILS: The patient was taken to the operating room and after induction of adequate generalized endotracheal anesthesia, the patient was rolled into the prone position on the Wilson frame and all pressure points were padded. The incision was marked utilizing AP fluoroscopy. The lumbar region was cleaned and then prepped with DuraPrep and draped in the usual sterile fashion. 5 cc of local anesthesia was injected and then a dorsal midline incision was made and carried down to the lumbo sacral fascia. The fascia was opened and the paraspinous musculature was taken down in a subperiosteal fashion to expose L2-3 and L3-4 bilaterally. Intraoperative fluoroscopy was used because of the scoliosis and the difficulty determining the level, AP and lateral shots  were used to confirm the level. The level was confirmed by all in the room.  I used a combination of the high-speed drill and the Kerrison punches to perform a laminectomy, medial facetectomy, and foraminotomy at L2-3 and L3-4 bilaterally. The patient had very severe spinal stenosis and severe deformity. Great care was taken during the drilling laminectomy process to leave as much of the pars and the facets as possible in order to reduce the chance of further destabilization. The underlying yellow ligament was opened and removed in a piecemeal fashion to expose the underlying dura and exiting nerve root. I undercut the lateral recess and dissected down until I was medial to and distal to the pedicle. The nerve root was well decompressed L2-3 and L3-4 bilaterally. . I then palpated with a coronary dilator along the nerve root and into the foramen to assure adequate decompression. I felt no more compression of the nerve root at either level. I then dissected out over the facets at L2-3 and L3-4 on the right and identified the transverse processes and the lateral facet. These were drilled to decorticate them. I then placed a mixture of locally harvested morselized autologous bone graft saved during the decompression with a morcellized allograft and put this into the posterior lateral space from L2-L4 on the right perform arthrodesis. I irrigated with saline solution containing bacitracin. Achieved hemostasis with bipolar cautery, lined the dura with Gelfoam, placed a medium Hemovac drain through separate stab incision, and then closed the fascia with 0 Vicryl. I closed the subcutaneous tissues with 2-0 Vicryl and the subcuticular tissues with 3-0 Vicryl. The skin was then closed with benzoin and Steri-Strips. The drapes were removed, a sterile dressing was applied. The patient was awakened from general anesthesia and transferred to the recovery room  in stable condition. At the end of the procedure all sponge, needle  and instrument counts were correct.    PLAN OF CARE: Admit to inpatient   PATIENT DISPOSITION:  PACU - hemodynamically stable.   Delay start of Pharmacological VTE agent (>24hrs) due to surgical blood loss or risk of bleeding:  yes

## 2016-05-03 NOTE — Progress Notes (Signed)
Contacted [employer] morning of surgery for rep.

## 2016-05-03 NOTE — Anesthesia Preprocedure Evaluation (Signed)
Anesthesia Evaluation  Patient identified by MRN, date of birth, ID band Patient awake    Reviewed: Allergy & Precautions, NPO status , Patient's Chart, lab work & pertinent test results  Airway Mallampati: II  TM Distance: >3 FB Neck ROM: Full    Dental  (+) Teeth Intact, Dental Advisory Given   Pulmonary    breath sounds clear to auscultation       Cardiovascular  Rhythm:Regular Rate:Normal     Neuro/Psych    GI/Hepatic   Endo/Other    Renal/GU      Musculoskeletal   Abdominal   Peds  Hematology   Anesthesia Other Findings   Reproductive/Obstetrics                             Anesthesia Physical Anesthesia Plan  ASA: III  Anesthesia Plan: General   Post-op Pain Management:    Induction: Intravenous  Airway Management Planned: Oral ETT  Additional Equipment:   Intra-op Plan:   Post-operative Plan:   Informed Consent: I have reviewed the patients History and Physical, chart, labs and discussed the procedure including the risks, benefits and alternatives for the proposed anesthesia with the patient or authorized representative who has indicated his/her understanding and acceptance.   Dental advisory given  Plan Discussed with: CRNA and Anesthesiologist  Anesthesia Plan Comments:         Anesthesia Quick Evaluation

## 2016-05-03 NOTE — Anesthesia Procedure Notes (Signed)
Procedure Name: Intubation Date/Time: 05/03/2016 7:45 AM Performed by: Faustino Congress Jere Vanburen Pre-anesthesia Checklist: Patient identified, Emergency Drugs available, Suction available and Patient being monitored Patient Re-evaluated:Patient Re-evaluated prior to inductionOxygen Delivery Method: Circle System Utilized Preoxygenation: Pre-oxygenation with 100% oxygen Intubation Type: IV induction Ventilation: Mask ventilation without difficulty Laryngoscope Size: Glidescope and 4 Grade View: Grade I Tube type: Oral Tube size: 7.0 mm Number of attempts: 1 Airway Equipment and Method: Stylet Placement Confirmation: ETT inserted through vocal cords under direct vision,  positive ETCO2 and breath sounds checked- equal and bilateral Secured at: 20 cm Tube secured with: Tape Dental Injury: Teeth and Oropharynx as per pre-operative assessment  Comments: Elective glidescope due to significant pain with neck extension, per patient "pain likely related to arthritis"

## 2016-05-03 NOTE — Anesthesia Postprocedure Evaluation (Addendum)
Anesthesia Post Note  Patient: [name] [name]  Procedure(s) Performed: Procedure(s) (LRB): Lumbar Two-Three, Lumbar Three-Four Posterior Lumbar Fusion withLaminectomy and Foraminotomy (N/A)  Patient location during evaluation: PACU Anesthesia Type: General Level of consciousness: awake, awake and alert and oriented Pain management: pain level controlled Vital Signs Assessment: post-procedure vital signs reviewed and stable Respiratory status: spontaneous breathing, nonlabored ventilation and respiratory function stable Cardiovascular status: blood pressure returned to baseline Anesthetic complications: no       Last Vitals:  Vitals:   05/03/16 1321 05/03/16 1815  BP: (!) 157/56 130/62  Pulse: 63 77  Resp:  20  Temp:  37 C    Last Pain:  Vitals:   05/03/16 1830  TempSrc:   PainSc: 9                  Ocie Tino COKER

## 2016-05-04 NOTE — Progress Notes (Signed)
Pt seen and examined.  No issues overnight. Pain fairly well controlled Tolerating po Up ambulating with family who are nurses. No PT/OT   EXAM: Temp:  [97.5 F (36.4 C)-99 F (37.2 C)] 98.5 F (36.9 C) (04/28 0548) Pulse Rate:  [63-89] 80 (04/28 0548) Resp:  [11-22] 20 (04/28 0548) BP: (86-157)/(47-107) 115/62 (04/28 0548) SpO2:  [90 %-100 %] 98 % (04/28 0548) Intake/Output      04/27 0701 - 04/28 0700 04/28 0701 - 04/29 0700   P.O. 480    I.V. (mL/kg) 2100 (27.5)    Total Intake(mL/kg) 2580 (33.8)    Urine (mL/kg/hr) 2190 (1.2)    Drains 245 (0.1)    Blood 250 (0.1)    Total Output 2685     Net -105           Awake and alert Follows commands throughout Full strength Wound c/d/i  Stable Continue current care Work with PT/OT Likely discharge tomorrow

## 2016-05-04 NOTE — Progress Notes (Signed)
Physical Therapy Treatment Patient Details Name: [name] [name] MRN: [id number] DOB: 01-Sep-1935 Today's Date: 05/04/2016    History of Present Illness pt is a 81 y/o female with pmh of CHF, AICD/pacer, CM, admitted with radicular leg pain not helped by conservative treatment, s/p L2-4 decompression and lateral fusion with bone grafting.    PT Comments    Pt loopy from pain meds but did much better walking. She was happy as first time she has walked in a long time that her bones werent grinding in her back while walking. She walked 120 feet with RW.  Pt needed a lot of cues for standing technque - she had poor posture prior due to pain.  Pt needs extensive PT to help her improve her posture and awareness of posture.  Pt lives with 58 year old husband and daughters only present for a day or two.  Talked to them about idea of  finding some help initially to help pt and husband with pts transition home.  HH services will definitely help.    Follow Up Recommendations  Home health PT;Supervision - Intermittent (due to pts poor ambulation prior and limited assist at home  - i feel pt definetly needs HH PT at DC)     Equipment Recommendations  None recommended by PT    Recommendations for Other Services       Precautions / Restrictions Precautions Precautions: Back;Fall Precaution Comments: drain intact Required Braces or Orthoses:  (no brace ordered)    Mobility  Bed Mobility Overal bed mobility: Needs Assistance Bed Mobility: Sidelying to Sit;Sit to Sidelying Rolling: Min assist Sidelying to sit: Min assist       General bed mobility comments: cues for tehcnque and allowed pt to use RW for bed rail - taught family how to secure it - like pt can do at home.  gave pt verbal cues only - she was abel to do all the work to sit up  Transfers Overall transfer level: Needs assistance Equipment used: Rolling walker (2 wheeled) Transfers: Sit to/from [employer] Sit to  Stand: Min assist;From elevated surface         General transfer comment: pt needed cues for hand placement and techqnue for sit to stands  Ambulation/Gait Ambulation/Gait assistance: Min assist Ambulation Distance (Feet): 120 Feet Assistive device: Rolling walker (2 wheeled)       General Gait Details: pt was so happy to walk - first time she has walked wtihout her bone grinding together.  Pt is used to walking with left hip sticking out and trunk to right.  I cued her several tims to stand tall - like Mrs [place]. she was abel to stand tall and walk -she looked great but needed frequent cues.  pt with narrow base of support - she needs towork on this in the future to incresae her safety with walking   Stairs            Wheelchair Mobility    Modified Rankin (Stroke Patients Only)       Balance                                            Cognition Arousal/Alertness: Awake/alert Behavior During Therapy: WFL for tasks assessed/performed Overall Cognitive Status: Within Functional Limits for tasks assessed  Exercises      General Comments General comments (skin integrity, edema, etc.): pt reveiwed back precautions - she wants to twist all the time (walking and in bed).  pt instructed to keep the Rw with her at all times for safety      Pertinent Vitals/Pain Pain Assessment: 0-10 Pain Score:  (back hurting 2-5/10 during PT session) Pain Descriptors / Indicators: Aching;Jabbing;Guarding Pain Intervention(s): Repositioned;Monitored during session;Premedicated before session (pt just had morphine prior to therapy)    Home Living                      Prior Function            PT Goals (current goals can now be found in the care plan section) Progress towards PT goals: Progressing toward goals    Frequency    Min 5X/week      PT Plan Current plan remains appropriate     Co-evaluation             End of Session Equipment Utilized During Treatment: Gait belt Activity Tolerance: Patient tolerated treatment well Patient left: in bed;with call bell/phone within reach;with family/visitor present;with bed alarm set Nurse Communication: Mobility status PT Visit Diagnosis: Unsteadiness on feet (R26.81);Muscle weakness (generalized) (M62.81);Other abnormalities of gait and mobility (R26.89);Pain     Time: 5366-4403 PT Time Calculation (min) (ACUTE ONLY): 30 min  Charges:  $Gait Training: 23-37 mins                    G Codes:      May 16, 2016   Ranae Palms, PT    Judson Roch May 16, 2016, 3:16 PM

## 2016-05-05 MED ORDER — HYDROCODONE-ACETAMINOPHEN 5-325 MG PO TABS: 1.0000 | ORAL_TABLET | ORAL | 0 refills | Status: DC | PRN

## 2016-05-05 MED ORDER — METHOCARBAMOL 750 MG PO TABS: 750.0000 mg | ORAL_TABLET | Freq: Two times a day (BID) | ORAL | 1 refills | Status: DC | PRN

## 2016-05-05 NOTE — Progress Notes (Signed)
Pt d/c to home by car with family. Assessment stable. Prescription given. All questions answered 

## 2016-05-05 NOTE — Evaluation (Signed)
Occupational Therapy Evaluation Patient Details Name: [name] [name] MRN: [id number] DOB: Jul 21, 1935 Today's Date: 05/05/2016    History of Present Illness pt is a 81 y/o female with pmh of CHF, AICD/pacer, CM, admitted with radicular leg pain not helped by conservative treatment, s/p L2-4 decompression and lateral fusion with bone grafting.   Clinical Impression   PTA, pt lived with her husband and was performing ADLs with difficulty. Pt is motivated to participate in therapy and is very sweet. Currently, pt performs ADLs, transfers, and functional mobility at [facility] guard level with VCs for adhering to precautions and necessary DME/AE. Pt educated on LB ADLs, tub and toilet transfer, bed mobility, and back precautions (issued handout). Recommend dc home with HHOT to increase pt safety and independence with ADLs and functional mobility due to her difficulty adhering to precautions.  All acute OT needs met and questions answered. Will sign off. Thank you.    Follow Up Recommendations  Home health OT;Supervision/Assistance - 24 hour    Equipment Recommendations  None recommended by OT (Pt confirms she has all needed DME/AE)    Recommendations for Other Services PT consult     Precautions / Restrictions Precautions Precautions: Back;Fall Precaution Booklet Issued: Yes (comment) Precaution Comments: Reviewed back precautions. drain intact Required Braces or Orthoses:  (no brace ordered) Restrictions Weight Bearing Restrictions: No      Mobility Bed Mobility Overal bed mobility: Needs Assistance Bed Mobility: Sidelying to Sit;Sit to Sidelying Rolling: Min guard Sidelying to sit: Min guard     Sit to sidelying: Min assist General bed mobility comments: Simulated home environement with bed elevated, bed rail lowered, and HOB lowered. Pt required Min A for lift BLE into bed when transitioning from EOB to sidelying. Performed better when coming from supine to EOB. Required Mod VCs for  log roll technique.  Transfers Overall transfer level: Needs assistance Equipment used: Rolling walker (2 wheeled) Transfers: Sit to/from [employer] Sit to Stand: Min guard         General transfer comment: Demonstrated good technique    Balance Overall balance assessment: Needs assistance Sitting-balance support: No upper extremity supported;Feet supported Sitting balance-Leahy Scale: Fair     Standing balance support: Bilateral upper extremity supported;During functional activity Standing balance-Leahy Scale: Fair Standing balance comment: Able to perform ADLs in standing             High level balance activites: Turns;Direction changes High Level Balance Comments: min guard assist with challenges to balance with RW.            ADL either performed or assessed with clinical judgement   ADL Overall ADL's : Needs assistance/impaired Eating/Feeding: Set up;Sitting   Grooming: Min guard;Standing   Upper Body Bathing: Set up;Sitting   Lower Body Bathing: Min guard;Sit to/from stand;Adhering to back precautions   Upper Body Dressing : Set up;Supervision/safety;Sitting   Lower Body Dressing: Min guard;Sit to/from stand;With adaptive equipment;Cueing for sequencing;Cueing for back precautions;Adhering to back precautions Lower Body Dressing Details (indicate cue type and reason): Initially required Min A for LB clothes. But with practice, pt able to perform LB dressing with Min guard for sit<>stand and VCs for sequencing Toilet Transfer: Min guard;Cueing for sequencing;RW (Simulated to recliner)       Tub/ Shower Transfer: Tub transfer;Min guard;Ambulation;Tub bench;Rolling walker Tub/Shower Transfer Details (indicate cue type and reason): Educated pt on tub transfer. She demonstrated good understanding Functional mobility during ADLs: Min guard;Supervision/safety;Rolling walker General ADL Comments: Pt performing ADLs and functional  mobility at [place]  guard-Min A level. Provided pt with all education on ADLs, bed mobility, transfers, and functional mobility. Pt educated on back precautions and was able to recall 2/3 without VCs.     Vision         Perception     Praxis      Pertinent Vitals/Pain Pain Assessment: 0-10 Pain Score: 9  Faces Pain Scale: Hurts whole lot Pain Location: back Pain Descriptors / Indicators: Aching;Jabbing;Guarding Pain Intervention(s): Monitored during session     Hand Dominance Right   Extremity/Trunk Assessment Upper Extremity Assessment Upper Extremity Assessment: Overall WFL for tasks assessed (Pt reports arthritis in Bil hands)   Lower Extremity Assessment Lower Extremity Assessment: Generalized weakness   Cervical / Trunk Assessment Cervical / Trunk Assessment: Normal   Communication Communication Communication: No difficulties   Cognition Arousal/Alertness: Awake/alert Behavior During Therapy: WFL for tasks assessed/performed Overall Cognitive Status: Impaired/Different from baseline                                 General Comments: Pt reports that she will be talking and then forget what she was saying mid-sentence. She reports this did not accure prior to surgery   General Comments  Requires additional VCs to adhere to precautions    Exercises     Shoulder Instructions      Home Living Family/patient expects to be discharged to:: Private residence Living Arrangements: Spouse/significant other Available Help at Discharge: Family;Available 24 hours/day Type of Home: House Home Access: Stairs to enter [employer] of Steps: 1 little step   Home Layout: Multi-level;Able to live on main level with bedroom/bathroom     Bathroom Shower/Tub: Tub/shower unit;Curtain   Biochemist, clinical: Standard     Home Equipment: Clinical cytogeneticist - 2 wheels;Tub bench;Grab bars - tub/shower;Grab bars - toilet;Hand held shower head;Adaptive equipment;Toilet riser  (riser with arms) Adaptive Equipment: Reacher        Prior Functioning/Environment Level of Independence: Independent with assistive device(s)        Comments: Pt was able to perform ADLs with difficulty        OT Problem List: Decreased strength;Decreased range of motion;Decreased activity tolerance;Decreased safety awareness;Decreased knowledge of use of DME or AE;Decreased knowledge of precautions;Pain      OT Treatment/Interventions:      OT Goals(Current goals can be found in the care plan section) Acute Rehab OT Goals Patient Stated Goal: Get stronger OT Goal Formulation: With patient Time For Goal Achievement: 05/19/16 Potential to Achieve Goals: Good  OT Frequency:     Barriers to D/C:            Co-evaluation              End of Session Equipment Utilized During Treatment: Gait belt;Rolling walker Nurse Communication: Mobility status;Precautions  Activity Tolerance: Patient tolerated treatment well Patient left: in chair;with call bell/phone within reach;with chair alarm set  OT Visit Diagnosis: Unsteadiness on feet (R26.81);Muscle weakness (generalized) (M62.81);Pain Pain - Right/Left:  (Lower back) Pain - part of body:  (Lower back)                Time: 5449-2010 OT Time Calculation (min): 47 min Charges:  OT General Charges $OT Visit: 1 Procedure OT Evaluation $OT Eval Low Complexity: 1 Procedure OT Treatments $Self Care/Home Management : 23-37 mins G-Codes:     [employer], OTR/L [place] 05/05/2016, 9:54 AM

## 2016-05-05 NOTE — Discharge Summary (Signed)
Physician Discharge Summary  Patient ID: [name] [name] MRN: [id number] DOB/AGE: 1935-08-27 81 y.o.  Admit date: 05/03/2016 Discharge date: 05/05/2016  Admission Diagnoses:  Severe scoliosis with severe spinal stenosis, lateral listhesis and spondylolisthesis, back and leg pain  Discharge Diagnoses:  Same Active Problems:   S/P lumbar fusion   Discharged Condition: Stable  Hospital Course:  [name] [name] is a 81 y.o. female who was admitted for the below procedure. There were no post operative complications. At time of discharge, pain was well controlled, ambulating with Pt/OT, tolerating po, voiding normal. Ready for discharge.  Treatments: Surgery - 1. Decompressive lumbar laminectomy, medial facetectomy and foraminotomies at L2-3 L3-4 bilaterally, 2. Lateral fusion L2-L4 inclusive utilizing locally harvested morselized autologous bone graft mixed with morcellized allograft  Discharge Exam: Blood pressure (!) 110/57, pulse 71, temperature 98.2 F (36.8 C), temperature source Oral, resp. rate 18, height 4\' 11"  (1.499 m), weight 76.3 kg (168 lb 3.2 oz), SpO2 94 %. Awake, alert, oriented Speech fluent, appropriate CN grossly intact 5/5 BUE/BLE Wound c/d/i  Disposition: 01-Home or Self Care  Discharge Instructions    Call MD for:  difficulty breathing, headache or visual disturbances    Complete by:  As directed    Call MD for:  persistant dizziness or light-headedness    Complete by:  As directed    Call MD for:  redness, tenderness, or signs of infection (pain, swelling, redness, odor or green/yellow discharge around incision site)    Complete by:  As directed    Call MD for:  severe uncontrolled pain    Complete by:  As directed    Call MD for:  temperature >100.4    Complete by:  As directed    Diet general    Complete by:  As directed    Driving Restrictions    Complete by:  As directed    Do not drive until given clearance.   Increase activity slowly     Complete by:  As directed    Lifting restrictions    Complete by:  As directed    Do not lift anything >10lbs. Avoid bending and twisting in awkward positions. Avoid bending at the back.     Allergies as of 05/05/2016      Reactions   Sulfa Antibiotics Hives      Medication List    TAKE these medications   ALLERGY EYE OP Place 1-2 drops into both eyes daily as needed (for irritated eyes).   amoxicillin 500 MG capsule Commonly known as:  AMOXIL Take 500 mg by mouth See admin instructions. TAKE 4 CAPSULES BY MOUTH  1 HOUR PRIOR TO DENTAL APPOINTMENT   aspirin EC 81 MG tablet Take 1 tablet (81 mg total) by mouth daily. What changed:  when to take this   aspirin-acetaminophen-caffeine 250-250-65 MG tablet Commonly known as:  EXCEDRIN MIGRAINE Take 1 tablet by mouth every 6 (six) hours as needed for headache or migraine.   betamethasone dipropionate 0.05 % lotion Apply 1 application topically daily as needed (for toes/fingers.).   calcium carbonate 500 MG chewable tablet Commonly known as:  TUMS - dosed in mg elemental calcium Chew 2 tablets by mouth 2 (two) times daily as needed for indigestion or heartburn.   carvedilol 12.5 MG tablet Commonly known as:  COREG Take 1 tablet (12.5 mg total) by mouth 2 (two) times daily.   furosemide 20 MG tablet Commonly known as:  LASIX Take 1 tablet (20 mg total) by mouth daily  as needed for edema. What changed:  reasons to take this   HYDROcodone-acetaminophen 5-325 MG tablet Commonly known as:  NORCO/VICODIN Take 1 tablet by mouth every 4 (four) hours as needed for moderate pain. What changed:  when to take this   losartan 25 MG tablet Commonly known as:  COZAAR Take 1 tablet (25 mg total) by mouth daily.   Melatonin 5 MG Tabs Take 5 mg by mouth at bedtime as needed (for sleep.).   methocarbamol 750 MG tablet Commonly known as:  ROBAXIN Take 1 tablet (750 mg total) by mouth 2 (two) times daily as needed. For leg spasms.    methylphenidate 20 MG tablet Commonly known as:  RITALIN Take 20 mg by mouth 2 (two) times daily as needed. For narcolepsy   NORTRIPYTLINE HCL PO Take 1 capsule by mouth at bedtime.   ondansetron 4 MG tablet Commonly known as:  ZOFRAN Take 0.5 tablets (2 mg total) by mouth every 8 (eight) hours as needed for nausea or vomiting. What changed:  how much to take   PROAIR HFA 108 (90 Base) MCG/ACT inhaler Generic drug:  albuterol Inhale 1 puff into the lungs every 4 (four) hours as needed for wheezing or shortness of breath.   PROBIOTIC PO Take 1 capsule by mouth every evening.   SYNTHROID 112 MCG tablet Generic drug:  levothyroxine Take 112 mcg by mouth daily.      Follow-up Information    Tia Alert, MD. Call.   Specialty:  Neurosurgery Why:  Call to schedule your first post operative visit Contact information: [address] [place] [address] [phone]           Signed: Alyson Ingles 05/05/2016, 7:12 AM

## 2016-05-05 NOTE — Care Management Note (Signed)
Case Management Note  Patient Details  Name: GAYLYNN CANNONE MRN: [id number] Date of Birth: 1935/03/07  Subjective/Objective:                   S/P lumbar fusion Action/Plan:  Discharge planning Expected Discharge Date:  05/05/16               Expected Discharge Plan:  Home w Home Health Services  In-House Referral:     Discharge planning Services  CM Consult  Post Acute Care Choice:  Home Health Choice offered to:  Patient  DME Arranged:  N/A DME Agency:  NA  HH Arranged:  PT, OT, Nurse's Aide HH Agency:  [facility]  Status of Service:  Completed, signed off  If discussed at Long Length of Stay Meetings, dates discussed:    Additional Comments: CM spoke with pt for choice and pt chooses [facility].  CM called x5 but unable to reach [facility] nurse on call.  CM has faxed referral to [phone] with not to please callback if [facility] does NOT accept referral.  No other CM needs were communicated. Yves Dill, RN 05/05/2016, 4:17 PM

## 2016-05-05 NOTE — Discharge Instructions (Signed)
Spinal Fusion, Care After °These instructions give you information about caring for yourself after your procedure. Your doctor may also give you more specific instructions. Call your doctor if you have any problems or questions after your procedure. °Follow these instructions at home: °Medicines  °· Take over-the-counter and prescription medicines only as told by your doctor. These include any medicines for pain. °· Do not drive for 24 hours if you received a sedative. °· Do not drive or use heavy machinery while taking prescription pain medicine. °· If you were prescribed an antibiotic medicine, take it as told by your doctor. Do not stop taking the antibiotic even if you start to feel better. °Surgical Cut (Incision) Care  °· Follow instructions from your doctor about how to take care of your surgical cut. Make sure you: °¨ Wash your hands with soap and water before you change your bandage (dressing). If you cannot use soap and water, use hand sanitizer. °¨ Change your bandage as told by your doctor. °¨ Leave stitches (sutures), skin glue, or skin tape (adhesive) strips in place. They may need to stay in place for 2 weeks or longer. If tape strips get loose and curl up, you may trim the loose edges. Do not remove tape strips completely unless your doctor says it is okay. °· Keep your surgical cut clean and dry. Do not take baths, swim, or use a hot tub until your doctor says it is okay. °· Check your surgical cut and the area around it every day for: °¨ Redness. °¨ Swelling. °¨ Fluid. °Physical Activity  °· Return to your normal activities as told by your doctor. Ask your doctor what activities are safe for you. Rest and protect your back as much as you can. °· Follow instructions from your doctor about how to move. Use good posture to help your spine heal. °· Do not lift anything that is heavier than 8 lb (3.6 kg) or as told by your doctor until he or she says that it is safe. Do not lift anything over your  head. °· Do not twist or bend at the waist until your doctor says it is okay. °· Avoid pushing or pulling motions. °· Do not sit or lie down in the same position for long periods of time. °· Do not start to exercise until your doctor says it is okay. Ask your doctor what kinds of exercise you can do to make your back stronger. °General instructions  °· If you were given a brace, use it as told by your doctor. °· Wear compression stockings as told by your doctor. °· Do not use tobacco products. These include cigarettes, chewing tobacco, or e-cigarettes. If you need help quitting, ask your doctor. °· Keep all follow-up visits as told by your doctor. This is important. This includes any visits with your physical therapist, if this applies. °Contact a doctor if: °· Your pain gets worse. °· Your medicine does not help your pain. °· Your legs or feet become painful or swollen. °· Your surgical cut is red, swollen, or painful. °· You have fluid, blood, or pus coming from your surgical cut. °· You feel sick to your stomach (nauseous). °· You throw up (vomit). °· Your have weakness or loss of feeling (numbness) in your legs that is new or getting worse. °· You have a fever. °· You have trouble controlling when you pee (urinate) or poop (have a bowel movement). °Get help right away if: °· Your pain is very   bad. °· You have chest pain. °· You have trouble breathing. °· You start to have a cough. °These symptoms may be an emergency. Do not wait to see if the symptoms will go away. Get medical help right away. Call your local emergency services (911 in the [place] Do not drive yourself to the hospital. °This information is not intended to replace advice given to you by your health care provider. Make sure you discuss any questions you have with your health care provider. °Document Released: 04/19/2010 Document Revised: 08/22/2015 Document Reviewed: 06/08/2014 °Elsevier Interactive Patient Education © 2017 [employer] ° °

## 2016-05-05 NOTE — Progress Notes (Signed)
Physical Therapy Treatment Patient Details Name: [name] [name] MRN: [id number] DOB: 1935/11/13 Today's Date: 05/05/2016    History of Present Illness pt is a 81 y/o female with pmh of CHF, AICD/pacer, CM, admitted with radicular leg pain not helped by conservative treatment, s/p L2-4 decompression and lateral fusion with bone grafting.    PT Comments    Pt admitted with above diagnosis. Pt currently with functional limitations due to balance and endurance deficits. Pt was able to ambulate in halls with min guard assist and cues.  Will have assist at home.  Will benefit from HHPT f/u.   Pt will benefit from skilled PT to increase their independence and safety with mobility to allow discharge to the venue listed below.     Follow Up Recommendations  Home health PT;Supervision - Intermittent (due to pts poor ambulation prior and limited assist at home  - i feel pt definetly needs HH PT at [place])     Equipment Recommendations  None recommended by PT    Recommendations for Other Services       Precautions / Restrictions Precautions Precautions: Back;Fall Precaution Comments: drain intact Required Braces or Orthoses:  (no brace ordered) Restrictions Weight Bearing Restrictions: No    Mobility  Bed Mobility Overal bed mobility: Needs Assistance Bed Mobility: Sidelying to Sit;Sit to Sidelying Rolling: Min guard Sidelying to sit: Min guard       General bed mobility comments: cues for tehcnque and allowed pt to use RW for bed rail - taught family how to secure it - like pt can do at home.  gave pt verbal cues only - she was abel to do all the work to sit up  Transfers Overall transfer level: Needs assistance Equipment used: Rolling walker (2 wheeled) Transfers: Sit to/from [employer] Sit to Stand: From elevated surface;Min guard         General transfer comment: pt needed cues for hand placement and techqnue for sit to stand  Ambulation/Gait Ambulation/Gait  assistance: Min assist Ambulation Distance (Feet): 140 Feet Assistive device: Rolling walker (2 wheeled) Gait Pattern/deviations: Step-through pattern;Decreased step length - right;Decreased step length - left;Decreased stride length;Antalgic     General Gait Details: Pt continues to need cues to stay close to RW and to stand tall.  Pt needs constant reminders for this.  Pt ambulated to bathroom and was able to get on and off toilet.  Cues needed for safetywith RW throughout.    Stairs Stairs: Yes   Stair Management: Step to pattern;Forwards;With walker Number of Stairs: 1 General stair comments: Cues for technique  Wheelchair Mobility    Modified Rankin (Stroke Patients Only)       Balance Overall balance assessment: Needs assistance Sitting-balance support: No upper extremity supported;Feet supported Sitting balance-Leahy Scale: Fair     Standing balance support: Bilateral upper extremity supported;During functional activity Standing balance-Leahy Scale: Poor Standing balance comment: relies on RW for balance.              High level balance activites: Turns;Direction changes High Level Balance Comments: min guard assist with challenges to balance with RW.             Cognition Arousal/Alertness: Awake/alert Behavior During Therapy: WFL for tasks assessed/performed Overall Cognitive Status: Within Functional Limits for tasks assessed  Exercises      General Comments General comments (skin integrity, edema, etc.): Pt needs reinforcement for precautions      Pertinent Vitals/Pain Pain Assessment: 0-10 Faces Pain Scale: Hurts whole lot Pain Location: back Pain Descriptors / Indicators: Aching;Jabbing;Guarding Pain Intervention(s): Limited activity within patient's tolerance;Monitored during session;Repositioned;Patient requesting pain meds-RN notified    Home Living                       Prior Function            PT Goals (current goals can now be found in the care plan section) Progress towards PT goals: Progressing toward goals    Frequency    Min 5X/week      PT Plan Current plan remains appropriate    Co-evaluation             End of Session Equipment Utilized During Treatment: Gait belt Activity Tolerance: Patient limited by pain;Patient limited by fatigue Patient left: with call bell/phone within reach;in chair;with chair alarm set Nurse Communication: Mobility status;Patient requests pain meds (RN brought prior to PT departure from room. ) PT Visit Diagnosis: Unsteadiness on feet (R26.81);Muscle weakness (generalized) (M62.81);Other abnormalities of gait and mobility (R26.89);Pain Pain - part of body:  (back)     Time: 0802-0819 PT Time Calculation (min) (ACUTE ONLY): 17 min  Charges:  $Gait Training: 8-22 mins                    G Codes:       Adellyn Capek,PT Acute Rehabilitation [phone] (pager)    Berline Lopes 05/05/2016, 9:00 AM

## 2016-05-06 ENCOUNTER — Telehealth: Payer: Self-pay | Admitting: *Deleted

## 2016-05-06 LAB — TYPE AND SCREEN
ABO/RH(D): A POS
Antibody Screen: POSITIVE
DAT, IGG: NEGATIVE
Donor AG Type: NEGATIVE
Donor AG Type: NEGATIVE
PT AG Type: NEGATIVE
UNIT DIVISION: 0
UNIT DIVISION: 0

## 2016-05-06 LAB — BPAM RBC
BLOOD PRODUCT EXPIRATION DATE: 201805102359
BLOOD PRODUCT EXPIRATION DATE: 201805102359
Unit Type and Rh: 6200
Unit Type and Rh: 6200

## 2016-05-13 ENCOUNTER — Encounter: Payer: Self-pay | Admitting: Internal Medicine

## 2016-05-28 ENCOUNTER — Encounter: Payer: Self-pay | Admitting: Internal Medicine

## 2016-05-28 ENCOUNTER — Ambulatory Visit (INDEPENDENT_AMBULATORY_CARE_PROVIDER_SITE_OTHER): Payer: Medicare Other | Admitting: Internal Medicine

## 2016-05-28 VITALS — BP 130/68 | HR 73 | Ht <= 58 in | Wt 163.0 lb

## 2016-05-28 DIAGNOSIS — I447 Left bundle-branch block, unspecified: Secondary | ICD-10-CM | POA: Diagnosis not present

## 2016-05-28 DIAGNOSIS — I428 Other cardiomyopathies: Secondary | ICD-10-CM | POA: Diagnosis not present

## 2016-05-28 DIAGNOSIS — Z95810 Presence of automatic (implantable) cardiac defibrillator: Secondary | ICD-10-CM

## 2016-05-28 DIAGNOSIS — R600 Localized edema: Secondary | ICD-10-CM

## 2016-05-28 DIAGNOSIS — I5022 Chronic systolic (congestive) heart failure: Secondary | ICD-10-CM

## 2016-05-28 NOTE — Progress Notes (Signed)
Patient Care Team: [name], MD as PCP - General (Family Medicine)   HPI  [name] [name] is a 81 y.o. female Seen in follow-up for nonischemic cardiomyopathy left bundle branch block and nonsustained ventricular tachycardia.   She underwent CRT Biotronik implantation  6/16. Initially she was significantly better.    She continues to feel quite a deep good deal better. Her breathlessness is still somewhat limiting but she is able to walk more than 100 feet. She had back surgery and she went from limited by her back to limited by her breathing. She's had some peripheral edema which is asymmetric or from the right. This has been present for about 3 months.   She has not noted any palpitations she's had no syncope    DATE TEST    6/16    echo   EF 25 %   1/17    echo   EF 55-60 %               Past Medical History:  Diagnosis Date  . AICD (automatic cardioverter/defibrillator) present   . Angina effort (HCC)   . Arthritis   . Asthma   . CHF (congestive heart failure) (HCC)   . Congestive dilated cardiomyopathy (HCC) 03/31/2014  . Hypothyroidism   . LBBB (left bundle branch block) 10/30/2013  . Left bundle branch block   . Pancreatitis, acute   . Presence of permanent cardiac pacemaker   . Thyroid disease     Past Surgical History:  Procedure Laterality Date  . ABDOMINAL HYSTERECTOMY    . CARDIAC DEFIBRILLATOR PLACEMENT    . CHOLECYSTECTOMY    . EP IMPLANTABLE DEVICE N/A 07/04/2014   Procedure: BiV ICD Insertion CRT-D;  Surgeon: Duke Salvia, MD;  Location: [facility] INVASIVE CV LAB;  Service: Cardiovascular;  Laterality: N/A;  . HERNIA REPAIR    . LEFT HEART CATHETERIZATION WITH CORONARY ANGIOGRAM N/A 11/02/2013   Procedure: LEFT HEART CATHETERIZATION WITH CORONARY ANGIOGRAM;  Surgeon: Corky Crafts, MD;  Location: [facility] CATH LAB;  Service: Cardiovascular;  Laterality: N/A;  . LOOP RECORDER IMPLANT N/A 04/13/2014   Procedure: LOOP RECORDER IMPLANT;   Surgeon: Duke Salvia, MD;  Location: [facility] CATH LAB;  Service: Cardiovascular;  Laterality: N/A;  . PACEMAKER INSERTION      Current Outpatient Prescriptions  Medication Sig Dispense Refill  . amoxicillin (AMOXIL) 500 MG capsule Take 500 mg by mouth See admin instructions. TAKE 4 CAPSULES BY MOUTH  1 HOUR PRIOR TO DENTAL APPOINTMENT  0  . aspirin EC 81 MG tablet Take 1 tablet (81 mg total) by mouth daily. (Patient taking differently: Take 81 mg by mouth every evening. ) 90 tablet 3  . aspirin-acetaminophen-caffeine (EXCEDRIN MIGRAINE) 250-250-65 MG per tablet Take 1 tablet by mouth every 6 (six) hours as needed for headache or migraine.    . betamethasone dipropionate 0.05 % lotion Apply 1 application topically daily as needed (for toes/fingers.).     [name] calcium carbonate (TUMS - DOSED IN MG ELEMENTAL CALCIUM) 500 MG chewable tablet Chew 2 tablets by mouth 2 (two) times daily as needed for indigestion or heartburn.    . carvedilol (COREG) 12.5 MG tablet Take 1 tablet (12.5 mg total) by mouth 2 (two) times daily. 180 tablet 3  . furosemide (LASIX) 20 MG tablet Take 1 tablet (20 mg total) by mouth daily as needed for edema. (Patient taking differently: Take 20 mg by mouth daily as needed for fluid or  edema. ) 90 tablet 3  . losartan (COZAAR) 25 MG tablet Take 1 tablet (25 mg total) by mouth daily. 90 tablet 3  . Melatonin 5 MG TABS Take 5 mg by mouth at bedtime as needed (for sleep.).    [name] methocarbamol (ROBAXIN) 750 MG tablet Take 1 tablet (750 mg total) by mouth 2 (two) times daily as needed. For leg spasms. 60 tablet 1  . methylphenidate (RITALIN) 20 MG tablet Take 20 mg by mouth 2 (two) times daily as needed. For narcolepsy    . Naphazoline-Pheniramine (ALLERGY EYE OP) Place 1-2 drops into both eyes daily as needed (for irritated eyes).    . ondansetron (ZOFRAN) 4 MG tablet Take 0.5 tablets (2 mg total) by mouth every 8 (eight) hours as needed for nausea or vomiting. (Patient taking differently:  Take 4 mg by mouth every 8 (eight) hours as needed for nausea or vomiting. ) 20 tablet 3  . PROAIR HFA 108 (90 BASE) MCG/ACT inhaler Inhale 1 puff into the lungs every 4 (four) hours as needed for wheezing or shortness of breath.     . Probiotic Product (PROBIOTIC PO) Take 1 capsule by mouth every evening.    [name] SYNTHROID 112 MCG tablet Take 112 mcg by mouth daily.     No current facility-administered medications for this visit.     Allergies  Allergen Reactions  . Sulfa Antibiotics Hives    Review of Systems negative except from HPI and PMH  Physical Exam BP 130/68   Pulse 73   Ht 4\' 10"  (1.473 m)   Wt 163 lb (73.9 kg)   SpO2 98%   BMI 34.07 kg/m  Well developed and nourished in no acute distress HENT normal Neck supple with JVP-flat Clear Regular rate and rhythm, no murmurs or gallops Abd-soft with active BS No Clubbing cyanosis mild right lower leg edema edema Skin-warm and dry A & Oriented  Grossly normal sensory and motor function   ECG demonstrates sinus rhythm with P-synchronous/ AV  pacing  Intervals 17/12/43. QRS was negative in lead V1 and upright in lead 1.     Assessment and  Plan  Nonischemic cardiomyopathy intercurrent resolution   Left bundle branch block  CRT-D  Biotronik  The patient's device was interrogated.  The information was reviewed. No changes were made in the programming.     Congestive heart failure class II  Hypertension  Asymmetric edema    BP well controlled   Euvolemic continue current meds  Will check LE doppler with edema to r/o DVT  Its hard to know whether she has pseudofusion or just a narrow QRS, but I suspect her LBBB may be intermittent   Continue Rx of cardiomyopathy

## 2016-05-28 NOTE — Patient Instructions (Signed)
Medication Instructions: - Your physician recommends that you continue on your current medications as directed. Please refer to the Current Medication list given to you today.  Labwork: - none ordered  Procedures/Testing: - Your physician has requested that you have a lower extremity venous duplex. This test is an ultrasound of the veins in the legs. It looks at venous blood flow that carries blood from the heart to the legs. Allow one hour for a Lower Venous exam. There are no restrictions or special instructions.  Follow-Up: - Your physician wants you to follow-up in: 1 year with Dr. Graciela Husbands. You will receive a reminder letter in the mail two months in advance. If you don't receive a letter, please call our office to schedule the follow-up appointment.   Any Additional Special Instructions Will Be Listed Below (If Applicable).     If you need a refill on your cardiac medications before your next appointment, please call your pharmacy.

## 2016-05-31 ENCOUNTER — Ambulatory Visit (HOSPITAL_COMMUNITY)
Admission: RE | Admit: 2016-05-31 | Discharge: 2016-05-31 | Disposition: A | Discharge status: Home / Self Care | Payer: Medicare Other | Source: Ambulatory Visit | Attending: Cardiovascular Disease | Admitting: Cardiovascular Disease

## 2016-05-31 DIAGNOSIS — R600 Localized edema: Secondary | ICD-10-CM

## 2016-05-31 DIAGNOSIS — I428 Other cardiomyopathies: Secondary | ICD-10-CM | POA: Diagnosis not present

## 2016-06-04 LAB — CUP PACEART INCLINIC DEVICE CHECK
HighPow Impedance: 77 Ohm
Implantable Lead Implant Date: 20160627
Implantable Lead Location: 753859
Implantable Lead Model: 379
Implantable Lead Model: 386835
Implantable Lead Model: 5076
Implantable Lead Serial Number: 49219603
Implantable Pulse Generator Implant Date: 20160627
Lead Channel Impedance Value: 594 Ohm
Lead Channel Pacing Threshold Amplitude: 0.5 V
Lead Channel Pacing Threshold Amplitude: 2 V
Lead Channel Pacing Threshold Pulse Width: 1 ms
Lead Channel Sensing Intrinsic Amplitude: 1.6 mV
Lead Channel Sensing Intrinsic Amplitude: 16.8 mV
Lead Channel Setting Pacing Amplitude: 2.5 V
Lead Channel Setting Pacing Pulse Width: 1 ms
Lead Channel Setting Sensing Sensitivity: 0.8 mV
MDC IDC LEAD IMPLANT DT: 20160627
MDC IDC LEAD IMPLANT DT: 20160627
MDC IDC LEAD LOCATION: 753858
MDC IDC LEAD LOCATION: 753860
MDC IDC LEAD SERIAL: 49263525
MDC IDC MSMT BATTERY VOLTAGE: 2.93 V
MDC IDC MSMT LEADCHNL RA IMPEDANCE VALUE: 521 Ohm
MDC IDC MSMT LEADCHNL RA PACING THRESHOLD PULSEWIDTH: 0.4 ms
MDC IDC MSMT LEADCHNL RV IMPEDANCE VALUE: 709 Ohm
MDC IDC MSMT LEADCHNL RV PACING THRESHOLD AMPLITUDE: 0.4 V
MDC IDC MSMT LEADCHNL RV PACING THRESHOLD PULSEWIDTH: 0.4 ms
MDC IDC MSMT LEADCHNL RV SENSING INTR AMPL: 17.9 mV
MDC IDC PG SERIAL: 60863705
MDC IDC SESS DTM: 20180522180600
MDC IDC SET LEADCHNL LV SENSING SENSITIVITY: 1.6 mV
MDC IDC SET LEADCHNL RA PACING AMPLITUDE: 2 V
MDC IDC SET LEADCHNL RV PACING AMPLITUDE: 2 V
MDC IDC SET LEADCHNL RV PACING PULSEWIDTH: 0.4 ms
MDC IDC STAT BRADY RA PERCENT PACED: 1 %
MDC IDC STAT BRADY RV PERCENT PACED: 98 %

## 2016-06-12 ENCOUNTER — Ambulatory Visit: Payer: Medicare Other | Admitting: Cardiology

## 2016-06-18 ENCOUNTER — Telehealth: Payer: Self-pay | Admitting: *Deleted

## 2016-06-18 NOTE — Telephone Encounter (Signed)
Called the pts Husband, for Dr Delton See and myself were informed that this pt was admitted to [facility] for nosocomial infection (pneumonia), from her back surgery a month ago.  Pt is currently inpatient with pneumonia and sepsis.  Husband states that they thought the pt had a PE yesterday, but ruled this out.  Pt had a negative D-Dimer and negative scans.  Husband states the pt is still very sick, weak, she has loss of appetite, and in excruciating pain.  Husband reports that the Internal MD wanted to discharge the pt today, but the husband refused this.  Husband reports that they would like to discharge the pt tomorrow, but she is too weak and Husband states that this would be an unsafe discharge, for there is no home health ordered, no help in the home for the pt, and and he feels that discharging her on PO antibiotics with sepsis, will only make her worse.  Husband states he is going to request for the pt to be discharged and transferred to [facility] for further management.  Husband wanted to make Dr Delton See aware of this, and that both the pt and himself will be scheduling a follow-up with her in the very near future.  Informed the pts Husband that I will make Dr Delton See aware of this information, and I agree with his plan for transfer, for potential unsafe discharge.  Advised the spouse to follow-up with our office as soon as she is discharged, to arrange for outpatient follow-up.  Husband verbalized understanding and agrees with this plan.

## 2016-06-19 ENCOUNTER — Telehealth: Payer: Self-pay | Admitting: Cardiology

## 2016-06-19 NOTE — Telephone Encounter (Signed)
Will route this to Dr Delton See as an Lorain Childes.

## 2016-06-19 NOTE — Telephone Encounter (Signed)
Follow Up:   [name] [name] wanted you to know the pt is going to stay another day in [facility] and she is doing better.

## 2016-06-20 NOTE — Telephone Encounter (Signed)
Dr. Sibyl Parr from [facility] called and spoke with Dr Delton See about this pt and her current health status.  Pt has been admitted to [facility] for over a week now.  Pt had multiple complications and Dr. Sibyl Parr suspects she now has endocarditis.  Dr Delton See spoke with Dr Sibyl Parr about a current plan for this pt, from a cardiac perspective.  Pt will undergo a TEE at [facility] and potentially may be transferred and admitted to [facility].  Dr Delton See provided Dr Sibyl Parr recommendations on this pt.  Dr Sibyl Parr to endorse to the pt and family.

## 2016-06-20 NOTE — Telephone Encounter (Signed)
F/U call:  Patient husband calling, states that he would like you to call him back and would not go into detail.

## 2016-06-21 ENCOUNTER — Inpatient Hospital Stay (HOSPITAL_COMMUNITY): Payer: Medicare Other

## 2016-06-21 ENCOUNTER — Inpatient Hospital Stay (HOSPITAL_COMMUNITY)
Admission: AD | Admit: 2016-06-21 | Discharge: 2016-06-24 | DRG: 872 | Disposition: A | Discharge status: Home Health | Payer: Medicare Other | Source: Other Acute Inpatient Hospital | Attending: Internal Medicine | Admitting: Internal Medicine

## 2016-06-21 ENCOUNTER — Encounter (HOSPITAL_COMMUNITY): Payer: Self-pay | Admitting: General Practice

## 2016-06-21 DIAGNOSIS — Z981 Arthrodesis status: Secondary | ICD-10-CM | POA: Diagnosis not present

## 2016-06-21 DIAGNOSIS — M1990 Unspecified osteoarthritis, unspecified site: Secondary | ICD-10-CM | POA: Diagnosis present

## 2016-06-21 DIAGNOSIS — E039 Hypothyroidism, unspecified: Secondary | ICD-10-CM | POA: Diagnosis present

## 2016-06-21 DIAGNOSIS — R7881 Bacteremia: Secondary | ICD-10-CM | POA: Diagnosis present

## 2016-06-21 DIAGNOSIS — Z96643 Presence of artificial hip joint, bilateral: Secondary | ICD-10-CM | POA: Diagnosis present

## 2016-06-21 DIAGNOSIS — Z7982 Long term (current) use of aspirin: Secondary | ICD-10-CM

## 2016-06-21 DIAGNOSIS — I25119 Atherosclerotic heart disease of native coronary artery with unspecified angina pectoris: Secondary | ICD-10-CM | POA: Diagnosis present

## 2016-06-21 DIAGNOSIS — I447 Left bundle-branch block, unspecified: Secondary | ICD-10-CM | POA: Diagnosis present

## 2016-06-21 DIAGNOSIS — E876 Hypokalemia: Secondary | ICD-10-CM | POA: Diagnosis present

## 2016-06-21 DIAGNOSIS — Z79899 Other long term (current) drug therapy: Secondary | ICD-10-CM | POA: Diagnosis not present

## 2016-06-21 DIAGNOSIS — Z9841 Cataract extraction status, right eye: Secondary | ICD-10-CM

## 2016-06-21 DIAGNOSIS — Z961 Presence of intraocular lens: Secondary | ICD-10-CM | POA: Diagnosis present

## 2016-06-21 DIAGNOSIS — I252 Old myocardial infarction: Secondary | ICD-10-CM

## 2016-06-21 DIAGNOSIS — J45909 Unspecified asthma, uncomplicated: Secondary | ICD-10-CM | POA: Diagnosis present

## 2016-06-21 DIAGNOSIS — Z9842 Cataract extraction status, left eye: Secondary | ICD-10-CM

## 2016-06-21 DIAGNOSIS — I110 Hypertensive heart disease with heart failure: Secondary | ICD-10-CM | POA: Diagnosis present

## 2016-06-21 DIAGNOSIS — Z950 Presence of cardiac pacemaker: Secondary | ICD-10-CM

## 2016-06-21 DIAGNOSIS — R51 Headache: Secondary | ICD-10-CM | POA: Diagnosis present

## 2016-06-21 DIAGNOSIS — K219 Gastro-esophageal reflux disease without esophagitis: Secondary | ICD-10-CM | POA: Diagnosis present

## 2016-06-21 DIAGNOSIS — I5022 Chronic systolic (congestive) heart failure: Secondary | ICD-10-CM | POA: Diagnosis present

## 2016-06-21 DIAGNOSIS — I420 Dilated cardiomyopathy: Secondary | ICD-10-CM | POA: Diagnosis present

## 2016-06-21 DIAGNOSIS — R079 Chest pain, unspecified: Secondary | ICD-10-CM

## 2016-06-21 DIAGNOSIS — B9561 Methicillin susceptible Staphylococcus aureus infection as the cause of diseases classified elsewhere: Secondary | ICD-10-CM | POA: Diagnosis present

## 2016-06-21 DIAGNOSIS — R0781 Pleurodynia: Secondary | ICD-10-CM | POA: Diagnosis not present

## 2016-06-21 DIAGNOSIS — Z95810 Presence of automatic (implantable) cardiac defibrillator: Secondary | ICD-10-CM | POA: Diagnosis not present

## 2016-06-21 DIAGNOSIS — Z882 Allergy status to sulfonamides status: Secondary | ICD-10-CM | POA: Diagnosis not present

## 2016-06-21 DIAGNOSIS — Z9889 Other specified postprocedural states: Secondary | ICD-10-CM | POA: Diagnosis not present

## 2016-06-21 DIAGNOSIS — Z8249 Family history of ischemic heart disease and other diseases of the circulatory system: Secondary | ICD-10-CM | POA: Diagnosis not present

## 2016-06-21 DIAGNOSIS — Z8349 Family history of other endocrine, nutritional and metabolic diseases: Secondary | ICD-10-CM | POA: Diagnosis not present

## 2016-06-21 DIAGNOSIS — A419 Sepsis, unspecified organism: Secondary | ICD-10-CM | POA: Diagnosis present

## 2016-06-21 HISTORY — DX: Anemia, unspecified: D64.9

## 2016-06-21 HISTORY — DX: Acute myocardial infarction, unspecified: I21.9

## 2016-06-21 HISTORY — DX: Methicillin susceptible Staphylococcus aureus infection as the cause of diseases classified elsewhere: B95.61

## 2016-06-21 HISTORY — DX: Gastro-esophageal reflux disease without esophagitis: K21.9

## 2016-06-21 HISTORY — DX: Acrodermatitis continua: L40.2

## 2016-06-21 HISTORY — DX: Bacteremia: R78.81

## 2016-06-21 HISTORY — DX: Personal history of other medical treatment: Z92.89

## 2016-06-21 LAB — CBC WITH DIFFERENTIAL/PLATELET
BASOS ABS: 0 10*3/uL (ref 0.0–0.1)
Basophils Relative: 0 %
EOS PCT: 2 %
Eosinophils Absolute: 0.1 10*3/uL (ref 0.0–0.7)
HCT: 32.2 % — ABNORMAL LOW (ref 36.0–46.0)
Hemoglobin: 10.2 g/dL — ABNORMAL LOW (ref 12.0–15.0)
LYMPHS PCT: 14 %
Lymphs Abs: 0.9 10*3/uL (ref 0.7–4.0)
MCH: 29.3 pg (ref 26.0–34.0)
MCHC: 31.7 g/dL (ref 30.0–36.0)
MCV: 92.5 fL (ref 78.0–100.0)
MONO ABS: 0.7 10*3/uL (ref 0.1–1.0)
Monocytes Relative: 11 %
Neutro Abs: 4.5 10*3/uL (ref 1.7–7.7)
Neutrophils Relative %: 73 %
PLATELETS: 252 10*3/uL (ref 150–400)
RBC: 3.48 MIL/uL — ABNORMAL LOW (ref 3.87–5.11)
RDW: 15.1 % (ref 11.5–15.5)
WBC: 6.1 10*3/uL (ref 4.0–10.5)

## 2016-06-21 LAB — COMPREHENSIVE METABOLIC PANEL
ALBUMIN: 2.4 g/dL — AB (ref 3.5–5.0)
ALK PHOS: 60 U/L (ref 38–126)
ALT: 9 U/L — AB (ref 14–54)
AST: 20 U/L (ref 15–41)
Anion gap: 6 (ref 5–15)
BILIRUBIN TOTAL: 0.7 mg/dL (ref 0.3–1.2)
BUN: 7 mg/dL (ref 6–20)
CO2: 27 mmol/L (ref 22–32)
CREATININE: 0.75 mg/dL (ref 0.44–1.00)
Calcium: 8.2 mg/dL — ABNORMAL LOW (ref 8.9–10.3)
Chloride: 103 mmol/L (ref 101–111)
GFR calc Af Amer: >60 mL/min (ref >60)
GFR calc non Af Amer: >60 mL/min (ref >60)
GLUCOSE: 91 mg/dL (ref 65–99)
POTASSIUM: 3.3 mmol/L — AB (ref 3.5–5.1)
Sodium: 136 mmol/L (ref 135–145)
TOTAL PROTEIN: 6.1 g/dL — AB (ref 6.5–8.1)

## 2016-06-21 LAB — PROTIME-INR
INR: 1.07
Prothrombin Time: 13.9 seconds (ref 11.4–15.2)

## 2016-06-21 LAB — TROPONIN I

## 2016-06-21 MED ORDER — CARVEDILOL 12.5 MG PO TABS
12.5000 mg | ORAL_TABLET | Freq: Two times a day (BID) | ORAL | Status: DC
  Administered 2016-06-21 – 2016-06-24 (×6): 12.5 mg via ORAL
  Filled 2016-06-21 (×6): qty 1

## 2016-06-21 MED ORDER — METHOCARBAMOL 500 MG PO TABS
750.0000 mg | ORAL_TABLET | Freq: Two times a day (BID) | ORAL | Status: DC | PRN
  Administered 2016-06-21 – 2016-06-24 (×4): 750 mg via ORAL
  Filled 2016-06-21 (×4): qty 2

## 2016-06-21 MED ORDER — ENOXAPARIN SODIUM 40 MG/0.4ML ~~LOC~~ SOLN
40.0000 mg | SUBCUTANEOUS | Status: DC
  Administered 2016-06-21 – 2016-06-23 (×3): 40 mg via SUBCUTANEOUS
  Filled 2016-06-21 (×3): qty 0.4

## 2016-06-21 MED ORDER — ASPIRIN EC 81 MG PO TBEC
81.0000 mg | DELAYED_RELEASE_TABLET | Freq: Every evening | ORAL | Status: DC
  Administered 2016-06-21 – 2016-06-24 (×4): 81 mg via ORAL
  Filled 2016-06-21 (×4): qty 1

## 2016-06-21 MED ORDER — ONDANSETRON HCL 4 MG PO TABS: 4.0000 mg | ORAL_TABLET | Freq: Four times a day (QID) | ORAL | Status: DC | PRN

## 2016-06-21 MED ORDER — LOSARTAN POTASSIUM 25 MG PO TABS
25.0000 mg | ORAL_TABLET | Freq: Every day | ORAL | Status: DC
  Administered 2016-06-22 – 2016-06-24 (×3): 25 mg via ORAL
  Filled 2016-06-21 (×3): qty 1

## 2016-06-21 MED ORDER — NAFCILLIN SODIUM 2 G IJ SOLR: 2.0000 g | INTRAMUSCULAR | Status: DC

## 2016-06-21 MED ORDER — ACETAMINOPHEN 325 MG PO TABS
650.0000 mg | ORAL_TABLET | Freq: Four times a day (QID) | ORAL | Status: DC | PRN
  Administered 2016-06-22 – 2016-06-23 (×6): 650 mg via ORAL
  Filled 2016-06-21 (×7): qty 2

## 2016-06-21 MED ORDER — LEVOTHYROXINE SODIUM 112 MCG PO TABS
112.0000 ug | ORAL_TABLET | Freq: Every day | ORAL | Status: DC
  Administered 2016-06-22 – 2016-06-24 (×3): 112 ug via ORAL
  Filled 2016-06-21 (×3): qty 1

## 2016-06-21 MED ORDER — ACETAMINOPHEN 650 MG RE SUPP: 650.0000 mg | Freq: Four times a day (QID) | RECTAL | Status: DC | PRN

## 2016-06-21 MED ORDER — NAFCILLIN SODIUM 2 G IJ SOLR
2.0000 g | INTRAVENOUS | Status: DC
  Administered 2016-06-21 – 2016-06-22 (×4): 2 g via INTRAVENOUS
  Filled 2016-06-21 (×6): qty 2000

## 2016-06-21 MED ORDER — SODIUM CHLORIDE 0.9% FLUSH
3.0000 mL | Freq: Two times a day (BID) | INTRAVENOUS | Status: DC
  Administered 2016-06-21 – 2016-06-24 (×5): 3 mL via INTRAVENOUS

## 2016-06-21 MED ORDER — ONDANSETRON HCL 4 MG/2ML IJ SOLN
4.0000 mg | Freq: Four times a day (QID) | INTRAMUSCULAR | Status: DC | PRN
  Administered 2016-06-23 – 2016-06-24 (×2): 4 mg via INTRAVENOUS
  Filled 2016-06-21 (×2): qty 2

## 2016-06-21 NOTE — Progress Notes (Signed)
[name] [name] is a 81 year old female with pmh CHF, hypothyroidism, s/p AICD; who was originally admitted at [facility] for persistent fevers. Patient had undergone laminectomy/fusion or stenosis of the lumbar spine on month prior by Dr. Yetta Barre of neurosurgery. The wound was noted to be healed without signs of infection. Blood cultures came back positive 2/2 sets for MSSA. Patient has been on nafcillin 2 g every 4 hours IV. TEE to be done at [facility] prior to transport as there is concern for endocarditis with possibility of an infected pacemaker lead. Case had been discussed with Dr. Delton See of cardiology. Vital signs noted to be stable. WBC 8.8, hemoglobin 10, BUN 9, and creatinine 0.6. Patient to be transferred to a telemetry bed as inpatient thereafter. Patient will likely need ID consult and cardiology for possible need of multiple pacemaker leads found to be infected.

## 2016-06-21 NOTE — H&P (Signed)
History and Physical    [name] [name]:[id number] DOB: 26-Jun-1935 DOA: 06/21/2016  PCP: [name], MD   Patient coming from: [facility]  Chief Complaint: Transfer for further evaluation of MSSA bacteremia, possible endocarditis and/or infected pacemaker lead  HPI: [name] [name] is a 81 y.o. woman with a history of CAD, prior MI, CHF, PPM/AICD placement, HTN, and hypothyroidism who is S/P lumbar laminectomy and fusion in April.  She presented to the ED at [facility] on 6/7 for evaluation of fever, nausea/vomiting, and generalized weakness.  She ultimately developed sepsis with hypotension and mild AKI and was found to have MSSA bacteremia (2 of 2 cultures positive from 6/7; repeat cultures from 6/9 without growth to date).  Source of bacteremia was initially unclear.  She had an LP on 6/7 and CT chest suggested RML and lingula infiltrates. Antibiotics were changed from vancomycin and rocephin to Levaquin.  She was on doxycycline briefly as well.  She eventually developed pleuritic chest pain with prompted repeat chest imaging.  CTA chest was negative for PE.  Surface echo was negative for vegetation.  She was referred for TEE and plan were also made to transfer her to [facility] so that she could be evaluated her cardiologists here Delton See, Graciela Husbands).  Pertinent labs from the outside hospital included normal WBC count.  Hgb 10.  BUN 9.  Creatinine 0.6.  Presently, she has a mild headache.  No chest pain or shortness of breath.  She has been light-headed but no syncope.  No nausea.  She is asking for her nightly muscle relaxer for leg and neck spasms.  Review of Systems: As per HPI otherwise 10 systems reviewed and negative.   Past Medical History:  Diagnosis Date  . AICD (automatic cardioverter/defibrillator) present   . Angina effort (HCC)   . Arthritis   . Asthma   . CHF (congestive heart failure) (HCC)   . Congestive dilated cardiomyopathy (HCC) 03/31/2014  .  Hypothyroidism   . LBBB (left bundle branch block) 10/30/2013  . Left bundle branch block   . Pancreatitis, acute   . Presence of permanent cardiac pacemaker   . Thyroid disease     Past Surgical History:  Procedure Laterality Date  . ABDOMINAL HYSTERECTOMY    . CARDIAC DEFIBRILLATOR PLACEMENT    . CHOLECYSTECTOMY    . EP IMPLANTABLE DEVICE N/A 07/04/2014   Procedure: BiV ICD Insertion CRT-D;  Surgeon: Duke Salvia, MD;  Location: [facility] INVASIVE CV LAB;  Service: Cardiovascular;  Laterality: N/A;  . HERNIA REPAIR    . LEFT HEART CATHETERIZATION WITH CORONARY ANGIOGRAM N/A 11/02/2013   Procedure: LEFT HEART CATHETERIZATION WITH CORONARY ANGIOGRAM;  Surgeon: Corky Crafts, MD;  Location: [facility] CATH LAB;  Service: Cardiovascular;  Laterality: N/A;  . LOOP RECORDER IMPLANT N/A 04/13/2014   Procedure: LOOP RECORDER IMPLANT;  Surgeon: Duke Salvia, MD;  Location: [facility] CATH LAB;  Service: Cardiovascular;  Laterality: N/A;  . PACEMAKER INSERTION       reports that she has never smoked. She has never used smokeless tobacco. She reports that she drinks alcohol. She reports that she does not use drugs.  Allergies  Allergen Reactions  . Sulfa Antibiotics Hives    Family History  Problem Relation Age of Onset  . Hyperlipidemia Mother   . Arrhythmia Mother        palpitations  . Heart disease Father   . Heart attack Father   . Heart attack Maternal Grandfather   .  Heart attack Paternal Grandfather   Daughter has HTN. Father had colon cancer and CVA   Prior to Admission medications   Medication Sig Start Date End Date Taking? Authorizing Provider  amoxicillin (AMOXIL) 500 MG capsule Take 500 mg by mouth See admin instructions. TAKE 4 CAPSULES BY MOUTH  1 HOUR PRIOR TO DENTAL APPOINTMENT 10/10/15   [provider]  aspirin EC 81 MG tablet Take 1 tablet (81 mg total) by mouth daily. Patient taking differently: Take 81 mg by mouth every evening.  10/29/13   Lars Masson, MD    aspirin-acetaminophen-caffeine (EXCEDRIN MIGRAINE) [phone] MG per tablet Take 1 tablet by mouth every 6 (six) hours as needed for headache or migraine.    [provider]  betamethasone dipropionate 0.05 % lotion Apply 1 application topically daily as needed (for toes/fingers.).     [provider]  calcium carbonate (TUMS - DOSED IN MG ELEMENTAL CALCIUM) 500 MG chewable tablet Chew 2 tablets by mouth 2 (two) times daily as needed for indigestion or heartburn.    [provider]  carvedilol (COREG) 12.5 MG tablet Take 1 tablet (12.5 mg total) by mouth 2 (two) times daily. 12/19/15   Lars Masson, MD  furosemide (LASIX) 20 MG tablet Take 1 tablet (20 mg total) by mouth daily as needed for edema. Patient taking differently: Take 20 mg by mouth daily as needed for fluid or edema.  09/07/15   Lars Masson, MD  losartan (COZAAR) 25 MG tablet Take 1 tablet (25 mg total) by mouth daily. 12/07/15 04/23/17  Lars Masson, MD  Melatonin 5 MG TABS Take 5 mg by mouth at bedtime as needed (for sleep.).    [provider]  methocarbamol (ROBAXIN) 750 MG tablet Take 1 tablet (750 mg total) by mouth 2 (two) times daily as needed. For leg spasms. 05/05/16   Costella, Darci Current, PA-C  methylphenidate (RITALIN) 20 MG tablet Take 20 mg by mouth 2 (two) times daily as needed. For narcolepsy 02/22/16   [provider]  Naphazoline-Pheniramine (ALLERGY EYE OP) Place 1-2 drops into both eyes daily as needed (for irritated eyes).    [provider]  ondansetron (ZOFRAN) 4 MG tablet Take 0.5 tablets (2 mg total) by mouth every 8 (eight) hours as needed for nausea or vomiting. Patient taking differently: Take 4 mg by mouth every 8 (eight) hours as needed for nausea or vomiting.  12/19/15   Lars Masson, MD  PROAIR HFA 108 (90 BASE) MCG/ACT inhaler Inhale 1 puff into the lungs every 4 (four) hours as needed for wheezing or shortness of breath.  09/08/13    [provider]  Probiotic Product (PROBIOTIC PO) Take 1 capsule by mouth every evening.    [provider]  SYNTHROID 112 MCG tablet Take 112 mcg by mouth daily. 03/05/16   [provider]    Physical Exam: Vitals:   06/21/16 1919  BP: (!) 157/71  Pulse: 72  Resp: 20  Temp: 98.2 F (36.8 C)  TempSrc: Oral  SpO2: 99%  Weight: 73.8 kg (162 lb 11.2 oz)  Height: 4\' 10"  (1.473 m)      Constitutional: NAD, calm, comfortable, NONtoxic appearing Vitals:   06/21/16 1919  BP: (!) 157/71  Pulse: 72  Resp: 20  Temp: 98.2 F (36.8 C)  TempSrc: Oral  SpO2: 99%  Weight: 73.8 kg (162 lb 11.2 oz)  Height: 4\' 10"  (1.473 m)   Eyes: PERRL, lids and conjunctivae normal ENMT:  Mucous membranes are moist. Posterior pharynx clear of any exudate or lesions. Normal dentition.  Neck: normal appearance, supple, no masses Respiratory: clear to auscultation bilaterally, no wheezing, no crackles. Normal respiratory effort. No accessory muscle use.  Cardiovascular: Normal rate, regular rhythm, + faint systolic murmur.  No rubs / gallops. No extremity edema. 2+ pedal pulses. GI: abdomen is soft and compressible.  No distention.  No tenderness.  Bowel sounds are present. Musculoskeletal:  No joint deformity in upper and lower extremities. Good ROM, no contractures. Normal muscle tone.  Skin: no rashes, warm and dry Neurologic: No apparent focal deficits. Psychiatric: Normal judgment and insight. Alert and oriented x 3. Normal mood.     Labs on Admission: I have personally reviewed following labs and imaging studies  CBC:  Recent Labs Lab 06/21/16 2153  WBC 6.1  NEUTROABS 4.5  HGB 10.2*  HCT 32.2*  MCV 92.5  PLT 252   Basic Metabolic Panel:  Recent Labs Lab 06/21/16 2153  NA 136  K 3.3*  CL 103  CO2 27  GLUCOSE 91  BUN 7  CREATININE 0.75  CALCIUM 8.2*   GFR: CrCl cannot be calculated (Patient's most recent lab result is older than the maximum 21 days  allowed.). Liver Function Tests:  Recent Labs Lab 06/21/16 2153  AST 20  ALT 9*  ALKPHOS 60  BILITOT 0.7  PROT 6.1*  ALBUMIN 2.4*   Coagulation Profile:  Recent Labs Lab 06/21/16 2153  INR 1.07   Cardiac Enzymes:  Recent Labs Lab 06/21/16 2153 06/22/16 0321  TROPONINI <0.03 <0.03      Radiological Exams on Admission: Dg Chest Port 1 View  Result Date: 06/21/2016 CLINICAL DATA:  Acute onset of generalized chest pain. Initial encounter. EXAM: PORTABLE CHEST 1 VIEW COMPARISON:  CTA of the chest performed 06/18/2016 FINDINGS: There is elevation of the right hemidiaphragm. Mild right basilar opacity may reflect atelectasis or mild pneumonia. Mild peribronchial thickening is seen. No pleural effusion or pneumothorax is seen. The cardiomediastinal silhouette is borderline normal in size. A pacemaker/AICD is noted overlying the left chest wall, with leads ending overlying the right atrium, right ventricle and coronary sinus. No acute osseous abnormalities are seen. IMPRESSION: Elevation of the right hemidiaphragm. Mild right basilar airspace opacity may reflect atelectasis or mild pneumonia. Mild peribronchial thickening seen. Electronically Signed   By: Roanna Raider M.D.   On: 06/21/2016 22:15    EKG: Independently reviewed. Paced rhythm.  Assessment/Plan Principal Problem:   MSSA bacteremia Active Problems:   Congestive dilated cardiomyopathy (HCC)   Chronic systolic CHF (congestive heart failure), NYHA class 3 (HCC)   Hypothyroidism   Biventricular ICD (implantable cardioverter-defibrillator) in place   S/P lumbar fusion   Pleuritic chest pain      MSSA bacteremia --Continue nafcillin --Consider ID consult in the AM --Repeat blood cultures here are pending --Sepsis resolved --No evidence of endocarditis per TTE and TEE at [facility].  Will need to call cardiology in the AM (she was transferred for this).  History of dilated cardiomyopathy --Compensated  CHF  History of MI --Baby aspirin and statin  Hypothyroidism --Levothyroxine  HTN --Coreg, Losartan  Hypokalemia --Replacement ordered.   DVT prophylaxis: Lovenox Code Status: FULL Family Communication: Patient alone at time of admission. Disposition Plan: To be determined. Consults called: Will need cardiology and infectious disease consults in the AM. Admission status: Inpatient, telemetry.   TIME SPENT: 60 minutes   Jerene Bears MD [facility] Pager [phone]  If 7PM-7AM, please contact  night-coverage [email or web address] Password [facility]  06/21/2016, 9:35 PM

## 2016-06-22 ENCOUNTER — Other Ambulatory Visit: Payer: Self-pay | Admitting: Internal Medicine

## 2016-06-22 ENCOUNTER — Encounter (HOSPITAL_COMMUNITY): Payer: Self-pay | Admitting: General Practice

## 2016-06-22 DIAGNOSIS — Z882 Allergy status to sulfonamides status: Secondary | ICD-10-CM

## 2016-06-22 DIAGNOSIS — Z8349 Family history of other endocrine, nutritional and metabolic diseases: Secondary | ICD-10-CM

## 2016-06-22 DIAGNOSIS — Z95810 Presence of automatic (implantable) cardiac defibrillator: Secondary | ICD-10-CM

## 2016-06-22 DIAGNOSIS — Z7982 Long term (current) use of aspirin: Secondary | ICD-10-CM

## 2016-06-22 DIAGNOSIS — B9561 Methicillin susceptible Staphylococcus aureus infection as the cause of diseases classified elsewhere: Secondary | ICD-10-CM

## 2016-06-22 DIAGNOSIS — I5022 Chronic systolic (congestive) heart failure: Secondary | ICD-10-CM

## 2016-06-22 DIAGNOSIS — Z79899 Other long term (current) drug therapy: Secondary | ICD-10-CM

## 2016-06-22 DIAGNOSIS — Z9889 Other specified postprocedural states: Secondary | ICD-10-CM

## 2016-06-22 DIAGNOSIS — Z8249 Family history of ischemic heart disease and other diseases of the circulatory system: Secondary | ICD-10-CM

## 2016-06-22 DIAGNOSIS — E039 Hypothyroidism, unspecified: Secondary | ICD-10-CM

## 2016-06-22 DIAGNOSIS — R7881 Bacteremia: Principal | ICD-10-CM

## 2016-06-22 DIAGNOSIS — Z981 Arthrodesis status: Secondary | ICD-10-CM

## 2016-06-22 LAB — TROPONIN I
Troponin I: <0.03 ng/mL (ref <0.03)
Troponin I: <0.03 ng/mL (ref <0.03)

## 2016-06-22 LAB — CBC WITH DIFFERENTIAL/PLATELET
BASOS PCT: 0 %
Basophils Absolute: 0 10*3/uL (ref 0.0–0.1)
EOS ABS: 0.1 10*3/uL (ref 0.0–0.7)
Eosinophils Relative: 2 %
HEMATOCRIT: 31.6 % — AB (ref 36.0–46.0)
HEMOGLOBIN: 10.3 g/dL — AB (ref 12.0–15.0)
LYMPHS ABS: 0.9 10*3/uL (ref 0.7–4.0)
LYMPHS PCT: 14 %
MCH: 30 pg (ref 26.0–34.0)
MCHC: 32.6 g/dL (ref 30.0–36.0)
MCV: 92.1 fL (ref 78.0–100.0)
Monocytes Absolute: 0.6 10*3/uL (ref 0.1–1.0)
Monocytes Relative: 9 %
NEUTROS ABS: 4.9 10*3/uL (ref 1.7–7.7)
Neutrophils Relative %: 75 %
Platelets: 238 10*3/uL (ref 150–400)
RBC: 3.43 MIL/uL — ABNORMAL LOW (ref 3.87–5.11)
RDW: 15 % (ref 11.5–15.5)
WBC: 6.5 10*3/uL (ref 4.0–10.5)

## 2016-06-22 LAB — BASIC METABOLIC PANEL
Anion gap: 8 (ref 5–15)
BUN: 8 mg/dL (ref 6–20)
CO2: 25 mmol/L (ref 22–32)
CREATININE: 0.78 mg/dL (ref 0.44–1.00)
Calcium: 8.3 mg/dL — ABNORMAL LOW (ref 8.9–10.3)
Chloride: 102 mmol/L (ref 101–111)
GFR calc Af Amer: >60 mL/min (ref >60)
GLUCOSE: 93 mg/dL (ref 65–99)
Potassium: 3.7 mmol/L (ref 3.5–5.1)
SODIUM: 135 mmol/L (ref 135–145)

## 2016-06-22 LAB — MAGNESIUM: Magnesium: 1.9 mg/dL (ref 1.7–2.4)

## 2016-06-22 MED ORDER — CEFAZOLIN SODIUM-DEXTROSE 2-4 GM/100ML-% IV SOLN
2.0000 g | Freq: Three times a day (TID) | INTRAVENOUS | Status: DC
  Administered 2016-06-22 – 2016-06-24 (×7): 2 g via INTRAVENOUS
  Filled 2016-06-22 (×11): qty 100

## 2016-06-22 MED ORDER — POTASSIUM CHLORIDE CRYS ER 20 MEQ PO TBCR
40.0000 meq | EXTENDED_RELEASE_TABLET | Freq: Once | ORAL | Status: AC
  Administered 2016-06-22: 40 meq via ORAL
  Filled 2016-06-22: qty 2

## 2016-06-22 NOTE — Progress Notes (Signed)
PHARMACY CONSULT NOTE FOR:  OUTPATIENT  PARENTERAL ANTIBIOTIC THERAPY (OPAT)  Indication: MSSA bacteremia Regimen: Ancef 2g IV qq8hr End date: 07/11/2016  IV antibiotic discharge orders are pended. To discharging provider:  please sign these orders via discharge navigator,  Select New Orders & click on the button choice - Manage This Unsigned Work.     Thank you for allowing pharmacy to be a part of this patient's care.  Pasty Spillers 06/22/2016, 12:38 PM

## 2016-06-22 NOTE — Consult Note (Signed)
Cardiology Consultation:   Patient ID: VELMER BROADFOOT; [id number]; Oct 13, 1935   Admit date: 06/21/2016 Date of Consult: 06/22/2016  Primary Care Provider: [name], MD Primary Cardiologist: Delton See Primary Electrophysiologist:  Graciela Husbands   Patient Profile:   [name] [name] is a 81 y.o. female with a hx of chronic systolic heart failure who is being seen today for the evaluation of staph bacteremia in the setting of an indwelling ICD at the request of Dr. Janee Morn.  History of Present Illness:   [name] [name] is a 81 yo woman with a long standing DCM with class 2 CHF and LBBB who underwent BiV ICD insertion almost 2 years ago. She has done well from a cardiac perspective. She underwent back surgery about 2 months ago. She has not had any pain. Over the past 2 weeks she has had daily fevers and chills. She was found to have MSSA bacteremia and underwent TEE which reportedly did not demonstrate evidence of a vegetation. She has improved markedly. Repeat blood cultures have been sterile.   Past Medical History:  Diagnosis Date  . 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 AICD (automatic cardioverter/defibrillator) present   . Anemia    hx  . Angina effort (HCC)   . Arthritis    "finger joints" (06/21/2016)  . Asthma   . CHF (congestive heart failure) (HCC)   . Congestive dilated cardiomyopathy (HCC) 03/31/2014  . Daily headache    "in the last week" (615/2018)  . GERD (gastroesophageal reflux disease)   . History of blood transfusion 1990s   "related to hysterectomy"  . Hypothyroidism   . LBBB (left bundle branch block) 10/30/2013  . Left bundle branch block   . MSSA bacteremia 06/21/2016  . Myocardial infarction (HCC)    "I've been told I've had some; I've never felt them" (06/21/2016)  . Pancreatitis, acute   . Pneumonia 06/2016  . Presence of permanent cardiac pacemaker   . Thyroid  disease     Past Surgical History:  Procedure Laterality Date  . ABDOMINAL HYSTERECTOMY    . APPENDECTOMY    . CARDIAC CATHETERIZATION    . CARDIAC DEFIBRILLATOR PLACEMENT  ?2016  . CATARACT EXTRACTION W/ INTRAOCULAR LENS  IMPLANT, BILATERAL Bilateral   . CHOLECYSTECTOMY OPEN     "related to pancreatitis"  . DILATION AND CURETTAGE OF UTERUS    . EP IMPLANTABLE DEVICE N/A 07/04/2014   Procedure: BiV ICD Insertion CRT-D;  Surgeon: Duke Salvia, MD;  Location: [facility] INVASIVE CV LAB;  Service: Cardiovascular;  Laterality: N/A;  . HERNIA REPAIR    . INSERT / REPLACE / REMOVE PACEMAKER  ?2016  . JOINT REPLACEMENT Bilateral    "thumb joints; used my own material"  . JOINT REPLACEMENT    . LEFT HEART CATHETERIZATION WITH CORONARY ANGIOGRAM N/A 11/02/2013   Procedure: LEFT HEART CATHETERIZATION WITH CORONARY ANGIOGRAM;  Surgeon: Corky Crafts, MD;  Location: [facility] CATH LAB;  Service: Cardiovascular;  Laterality: N/A;  . LOOP RECORDER IMPLANT N/A 04/13/2014   Procedure: LOOP RECORDER IMPLANT;  Surgeon: Duke Salvia, MD;  Location: [facility] CATH LAB;  Service: Cardiovascular;  Laterality: N/A;  . LOOP RECORDER REMOVAL  ?2016  . POSTERIOR LUMBAR FUSION  05/03/2016   Lumbar Two-Three, Lumbar Three-Four Posterior Lumbar Fusion withLaminectomy and Foraminotomy  . TONSILLECTOMY    . TOTAL HIP ARTHROPLASTY Bilateral   . UMBILICAL HERNIA REPAIR  1970s?     Inpatient Medications: Scheduled Meds: . aspirin EC  81 mg Oral QPM  . carvedilol  12.5 mg Oral BID  . enoxaparin (LOVENOX) injection  40 mg Subcutaneous Q24H  . levothyroxine  112 mcg Oral QAC breakfast  . losartan  25 mg Oral Daily  . sodium chloride flush  3 mL Intravenous Q12H   Continuous Infusions: .  ceFAZolin (ANCEF) IV Stopped (06/22/16 1415)   PRN Meds: acetaminophen **OR** acetaminophen, methocarbamol, ondansetron **OR** ondansetron (ZOFRAN) IV  Allergies:    Allergies  Allergen Reactions  . Sulfa Antibiotics Hives     Social History:   Social History   Social History  . Marital status: Married    Spouse name: N/A  . Number of children: N/A  . Years of education: N/A   Occupational History  . Not on file.   Social History Main Topics  . Smoking status: Never Smoker  . Smokeless tobacco: Never Used     Comment: "smoked as a teenager; for maybe 1 wk"  . Alcohol use Yes     Comment: 6/15//2018 "glass of wine q 6 months or so"  . Drug use: No  . Sexual activity: Not on file   Other Topics Concern  . Not on file   Social History Narrative  . No narrative on file    Family History:   The patient's family history includes Arrhythmia in her mother; Heart attack in her father, maternal grandfather, and paternal grandfather; Heart disease in her father; Hyperlipidemia in her mother.  ROS:  Please see the history of present illness.  ROS  All other ROS reviewed and negative.     Physical Exam/Data:   Vitals:   06/21/16 1919 06/21/16 2317 06/22/16 0405 06/22/16 1324  BP: (!) 157/71 (!) 158/67 (!) 147/65 (!) 158/67  Pulse: 72  77 75  Resp: 20 18 18 17   Temp: 98.2 F (36.8 C) 98.3 F (36.8 C) 98.4 F (36.9 C) 97.7 F (36.5 C)  TempSrc: Oral  Oral Oral  SpO2: 99%  95% 98%  Weight: 162 lb 11.2 oz (73.8 kg)     Height: 4\' 10"  (1.473 m)       Intake/Output Summary (Last 24 hours) at 06/22/16 1800 Last data filed at 06/22/16 1700  Gross per 24 hour  Intake              700 ml  Output              401 ml  Net              299 ml   Filed Weights   06/21/16 1919  Weight: 162 lb 11.2 oz (73.8 kg)   Body mass index is 34 kg/m.  General:  Well nourished, well developed, in no acute distress and not ill appearing HEENT: normal Lymph: no adenopathy Neck: 6 cm JVD Endocrine:  No thryomegaly Vascular: No carotid bruits; FA pulses 2+ bilaterally without bruits  Cardiac:  normal S1, S2; RRR; no murmur  Lungs:  clear to auscultation bilaterally, no wheezing, rhonchi or rales  Abd:  soft, nontender, no hepatomegaly  Ext: no edema Musculoskeletal:  No deformities, BUE and BLE strength normal and equal Skin: warm and dry  Neuro:  CNs 2-12 intact, no focal abnormalities noted Psych:  Normal affect    EKG:  The EKG was personally reviewed and demonstrates nsr with biv pacing  Relevant CV Studies: none  Laboratory Data:  Chemistry Recent Labs Lab  06/21/16 2153 06/22/16 1048  NA 136 135  K 3.3* 3.7  CL 103 102  CO2 27 25  GLUCOSE 91 93  BUN 7 8  CREATININE 0.75 0.78  CALCIUM 8.2* 8.3*  GFRNONAA >60 >60  GFRAA >60 >60  ANIONGAP 6 8     Recent Labs Lab 06/21/16 2153  PROT 6.1*  ALBUMIN 2.4*  AST 20  ALT 9*  ALKPHOS 60  BILITOT 0.7   Hematology Recent Labs Lab 06/21/16 2153 06/22/16 1048  WBC 6.1 6.5  RBC 3.48* 3.43*  HGB 10.2* 10.3*  HCT 32.2* 31.6*  MCV 92.5 92.1  MCH 29.3 30.0  MCHC 31.7 32.6  RDW 15.1 15.0  PLT 252 238   Cardiac Enzymes Recent Labs Lab 06/21/16 2153 06/22/16 0321 06/22/16 1048  TROPONINI <0.03 <0.03 <0.03   No results for input(s): TROPIPOC in the last 168 hours.  BNPNo results for input(s): BNP, PROBNP in the last 168 hours.  DDimer No results for input(s): DDIMER in the last 168 hours.  Radiology/Studies:  Dg Chest Port 1 View  Result Date: 06/21/2016 CLINICAL DATA:  Acute onset of generalized chest pain. Initial encounter. EXAM: PORTABLE CHEST 1 VIEW COMPARISON:  CTA of the chest performed 06/18/2016 FINDINGS: There is elevation of the right hemidiaphragm. Mild right basilar opacity may reflect atelectasis or mild pneumonia. Mild peribronchial thickening is seen. No pleural effusion or pneumothorax is seen. The cardiomediastinal silhouette is borderline normal in size. A pacemaker/AICD is noted overlying the left chest wall, with leads ending overlying the right atrium, right ventricle and coronary sinus. No acute osseous abnormalities are seen. IMPRESSION: Elevation of the right hemidiaphragm. Mild right  basilar airspace opacity may reflect atelectasis or mild pneumonia. Mild peribronchial thickening seen. Electronically Signed   By: Roanna Raider M.D.   On: 06/21/2016 22:15    Assessment and Plan:   1. MSSA bacteremia - she appears to be much improved since starting IV anti-biotics. She appears to have been ill for over 10 days before obtaining medical treatment. I remain concerned that she has seeded her device but would recommend careful observation for now with plans to remove her device only if there is evidence of recurrent infection. She will need 4 weeks of IV anti-biotics followed by surveillance cultures. 2. Chronic systolic heart failure - her symptoms are well controlled.  3. ICD - her device appears to be working normally. Will not make any programming changes.  Gregg Taylor,M.D.     Signed, Lewayne Bunting, MD  06/22/2016 6:00 PM

## 2016-06-22 NOTE — Consult Note (Signed)
[facility]       Reason for Consult: MSSA bacteremia    Referring Physician: Dr. Janee Morn  Principal Problem:   MSSA bacteremia Active Problems:   Congestive dilated cardiomyopathy (HCC)   Chronic systolic CHF (congestive heart failure), NYHA class 3 (HCC)   Hypothyroidism   Biventricular ICD (implantable cardioverter-defibrillator) in place   S/P lumbar fusion   Pleuritic chest pain   . aspirin EC  81 mg Oral QPM  . carvedilol  12.5 mg Oral BID  . enoxaparin (LOVENOX) injection  40 mg Subcutaneous Q24H  . levothyroxine  112 mcg Oral QAC breakfast  . losartan  25 mg Oral Daily  . sodium chloride flush  3 mL Intravenous Q12H    Recommendations: Cefazolin for 4 weeks from negative blood culture Home health/OPAT consult I will stop nafcillin  EP evaluation for ? Removal.  If not removed,  will plan on 4 weeks of IV antibiotics and post antibiotic surveillance culture; if she develops a new fever or other concerns will readdress [facility] for picc line since blood culture 6/8 negative Cefazolin through July 5th Remove picc line at the end of treatment Will arrange follow up after July 5th  Assessment: She has bacteremia with MSSA in the setting of recent back surgery done by Dr. Yetta Barre on 05/03/16, ICD.   Initial blood cultures were 2/2 with Staph aureus (sensivities not available in paper record) from 6/5 and repeat set on 6/8 remained negative at 5 days.  She did have diffuse pain including her back but this has all resolved, she is no longer having pain. CT of back to not find any infection.    Antibiotics: nafcillin  HPI: [name] [name] is a 81 y.o. female with CAD, history of MI, PPM/AICD placement and recent lumbar laminectomy by Dr. Yetta Barre who came in to [place] on 6/7 with fever, weakness and diffuse pain.  She was found to have MSSA bacteremia and started on appropriate therapy.  She underwent thorough evaluation with CT to include spine, TEE  and repeat blood cultures all reassurring.  She was sent her for evaluation by EP for ? ICDPPM management and management of infection.  Repeat blood cultures done here and repeat at [facility] and are negative. She has greatly improved.   Record from [place] reviewed as described above.  She has negative blood cultures.  She was on ceftriaxone, levaquin with concern for pneumonia, doxycyline for concern for tick-related illness prior to identifying bacteremia.    Review of Systems:  Constitutional: negative for fevers, chills and fatigue Gastrointestinal: negative for nausea and diarrhea Integument/breast: negative for rash Musculoskeletal: negative for myalgias, arthralgias and stiff joints All other systems reviewed and are negative    Past Medical History:  Diagnosis Date  . 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 AICD (automatic cardioverter/defibrillator) present   . Anemia    hx  . Angina effort (HCC)   . Arthritis    "finger joints" (06/21/2016)  . Asthma   . CHF (congestive heart failure) (HCC)   . Congestive dilated cardiomyopathy (HCC) 03/31/2014  . Daily headache    "in the last week" (615/2018)  . GERD (gastroesophageal reflux disease)   . History of blood transfusion 1990s   "related to hysterectomy"  . Hypothyroidism   . LBBB (left bundle branch block) 10/30/2013  . Left bundle branch block   .  MSSA bacteremia 06/21/2016  . Myocardial infarction (HCC)    "I've been told I've had some; I've never felt them" (06/21/2016)  . Pancreatitis, acute   . Pneumonia 06/2016  . Presence of permanent cardiac pacemaker   . Thyroid disease     Social History  Substance Use Topics  . Smoking status: Never Smoker  . Smokeless tobacco: Never Used     Comment: "smoked as a teenager; for maybe 1 wk"  . Alcohol use Yes     Comment: 6/15//2018 "glass of wine q 6 months or so"    Family History    Problem Relation Age of Onset  . Hyperlipidemia Mother   . Arrhythmia Mother        palpitations  . Heart disease Father   . Heart attack Father   . Heart attack Maternal Grandfather   . Heart attack Paternal Grandfather     Allergies  Allergen Reactions  . Sulfa Antibiotics Hives    Physical Exam: Constitutional: in no apparent distress and alert  Vitals:   06/21/16 2317 06/22/16 0405  BP: (!) 158/67 (!) 147/65  Pulse:  77  Resp: 18 18  Temp: 98.3 F (36.8 C) 98.4 F (36.9 C)   EYES: anicteric ENMT: no thrush Cardiovascular: Cor RRR Respiratory: CTA B; normal respiratory effort GI: Bowel sounds are normal, liver is not enlarged, spleen is not enlarged Musculoskeletal: no pedal edema noted Skin: negatives: no rash Hematologic: no cervical lad  Lab Results  Component Value Date   WBC 6.1 06/21/2016   HGB 10.2 (L) 06/21/2016   HCT 32.2 (L) 06/21/2016   MCV 92.5 06/21/2016   PLT 252 06/21/2016    Lab Results  Component Value Date   CREATININE 0.75 06/21/2016   BUN 7 06/21/2016   NA 136 06/21/2016   K 3.3 (L) 06/21/2016   CL 103 06/21/2016   CO2 27 06/21/2016    Lab Results  Component Value Date   ALT 9 (L) 06/21/2016   AST 20 06/21/2016   ALKPHOS 60 06/21/2016     Microbiology: Recent Results (from the past 240 hour(s))  Culture, blood (Routine X 2) w Reflex to ID Panel     Status: None (Preliminary result)   Collection Time: 06/21/16 10:05 PM  Result Value Ref Range Status   Specimen Description BLOOD RIGHT HAND  Final   Special Requests IN PEDIATRIC BOTTLE Blood Culture adequate volume  Final   Culture NO GROWTH < 12 HOURS  Final   Report Status PENDING  Incomplete  Culture, blood (Routine X 2) w Reflex to ID Panel     Status: None (Preliminary result)   Collection Time: 06/21/16 10:08 PM  Result Value Ref Range Status   Specimen Description BLOOD LEFT HAND  Final   Special Requests   Final    BOTTLES DRAWN AEROBIC ONLY Blood Culture adequate  volume   Culture NO GROWTH < 12 HOURS  Final   Report Status PENDING  Incomplete    Staci Righter, MD [facility] [email or web address] [phone] pager  [phone] cell 06/22/2016, 11:40 AM

## 2016-06-22 NOTE — Evaluation (Signed)
Physical Therapy Evaluation Patient Details Name: [name] [name] MRN: [id number] DOB: [birth year] Today's Date: 06/22/2016   History of Present Illness  Pt is a 81 yo female admitted from [facility] on 06/21/16. Pt was admitted to [facility] on 06/13/16 with a fever, nausea and vomiting and generalized weakness. Pt developed sepsis, hypotension and mild AKI and was diagnosed with MSSA bacteremia. Source is still in question, now considering leads from implaned defibrillator. PMH significant for lumbar surgery april, CHF, cardiomyopathy, hypothyroidism, biventricular ICD.    Clinical Impression  Pt presents with the above diagnosis and below deficits for therapy evaluation. Prior to admission, pt lived with her husband in a multi-level home with main living environment on the first level. Pt is initially resistant to mobility this session, but agrees to mobilize and is able to perform bed mobs, transfers and gait with min guard assistance. Pt reports feeling better following therapy session. Pt will benefit from continued acute PT services in order to address the above deficits prior to discharge home.     Follow Up Recommendations Home health PT    Equipment Recommendations  None recommended by PT    Recommendations for Other Services       Precautions / Restrictions Precautions Precautions: Fall Restrictions Weight Bearing Restrictions: No      Mobility  Bed Mobility Overal bed mobility: Modified Independent             General bed mobility comments: able to get EOB without assistance  Transfers Overall transfer level: Needs assistance Equipment used: Rolling walker (2 wheeled) Transfers: Sit to/from Stand Sit to Stand: Min guard         General transfer comment: min guard for safety from EOB.   Ambulation/Gait Ambulation/Gait assistance: Min guard Ambulation Distance (Feet): 75 Feet Assistive device: Rolling walker (2 wheeled) Gait Pattern/deviations:  Step-through pattern;Decreased step length - right;Decreased stance time - left Gait velocity: decreased Gait velocity interpretation: Below normal speed for age/gender General Gait Details: slight drop foot LLE, compensated well with knee flexion. Slow cadence with forward trunk lean throughout gait.   Stairs            Wheelchair Mobility    Modified Rankin (Stroke Patients Only)       Balance Overall balance assessment: Needs assistance Sitting-balance support: No upper extremity supported;Feet supported Sitting balance-Leahy Scale: Normal     Standing balance support: No upper extremity supported Standing balance-Leahy Scale: Fair                               Pertinent Vitals/Pain Pain Assessment: 0-10 Pain Score: 5  Pain Location: lumbar region Pain Descriptors / Indicators: Grimacing;Guarding;Aching Pain Intervention(s): Monitored during session;Repositioned;Premedicated before session    Home Living Family/patient expects to be discharged to:: Private residence Living Arrangements: Spouse/significant other Available Help at Discharge: Family;Available 24 hours/day Type of Home: House Home Access: Stairs to enter   [employer] of Steps: 1 little step Home Layout: Multi-level;Able to live on main level with bedroom/bathroom Home Equipment: Shower seat;Walker - 2 wheels;Tub bench;Grab bars - tub/shower;Grab bars - toilet;Hand held shower head;Adaptive equipment;Toilet riser      Prior Function Level of Independence: Independent with assistive device(s)         Comments: Pt was able to perform ADLs with difficulty     Hand Dominance   Dominant Hand: Right    Extremity/Trunk Assessment   Upper Extremity Assessment Upper Extremity  Assessment: Defer to OT evaluation    Lower Extremity Assessment Lower Extremity Assessment: Generalized weakness    Cervical / Trunk Assessment Cervical / Trunk Assessment: Kyphotic (slight  forward trunk lean)  Communication   Communication: No difficulties  Cognition Arousal/Alertness: Awake/alert Behavior During Therapy: WFL for tasks assessed/performed Overall Cognitive Status: Within Functional Limits for tasks assessed                                        General Comments      Exercises     Assessment/Plan    PT Assessment Patient needs continued PT services  PT Problem List Decreased strength;Decreased activity tolerance;Decreased balance;Decreased mobility;Pain       PT Treatment Interventions DME instruction;Gait training;Functional mobility training;Therapeutic activities;Therapeutic exercise;Balance training    PT Goals (Current goals can be found in the Care Plan section)  Acute Rehab PT Goals Patient Stated Goal: to get home  PT Goal Formulation: With patient Time For Goal Achievement: 06/29/16 Potential to Achieve Goals: Good    Frequency Min 3X/week   Barriers to discharge        Co-evaluation               AM-PAC PT "6 Clicks" Daily Activity  Outcome Measure Difficulty turning over in bed (including adjusting bedclothes, sheets and blankets)?: None Difficulty moving from lying on back to sitting on the side of the bed? : None Difficulty sitting down on and standing up from a chair with arms (e.g., wheelchair, bedside commode, etc,.)?: A Little Help needed moving to and from a bed to chair (including a wheelchair)?: A Little Help needed walking in hospital room?: A Little Help needed climbing 3-5 steps with a railing? : A Little 6 Click Score: 20    End of Session Equipment Utilized During Treatment: Gait belt Activity Tolerance: Patient tolerated treatment well Patient left: with call bell/phone within reach;Other (comment) (in bathroom) Nurse Communication: Mobility status;Other (comment) (Rn aware) PT Visit Diagnosis: Unsteadiness on feet (R26.81);Difficulty in walking, not elsewhere classified  (R26.2);Pain Pain - Right/Left:  (lower) Pain - part of body:  (back)    Time: 9604-5409 PT Time Calculation (min) (ACUTE ONLY): 24 min   Charges:   PT Evaluation $PT Eval Moderate Complexity: 1 Procedure PT Treatments $Gait Training: 8-22 mins   PT G Codes:        Colin Broach PT, DPT  [phone]   Ruel Favors Aletha Halim 06/22/2016, 2:50 PM

## 2016-06-22 NOTE — Progress Notes (Signed)
PROGRESS NOTE    [name] [name]  UJW:[id number] DOB: 03-30-1935 DOA: 06/21/2016 PCP: [name], MD    Brief Narrative:  Patient is a 81 year old female history of coronary artery disease, pacemaker placement/AICD, hypertension, hypothyroidism, status post lumbar laminectomy and fusion presenting to the ED around of hospital 06/13/2016 for evaluation of fever nausea vomiting generalized weakness. Patient noted to be septic at outside hospital and subsequently found to have MSSA bacteremia and placed on nafcillin. Also at outside hospitals concern for possible pneumonia and patient received antibiotics of vancomycin, Rocephin, Levaquin and doxycycline. Patient also with complaints of pleuritic chest pain prompted repeated chest imaging a CT angiogram was negative for PE.   Assessment & Plan:   Principal Problem:   MSSA bacteremia Active Problems:   Congestive dilated cardiomyopathy (HCC)   Chronic systolic CHF (congestive heart failure), NYHA class 3 (HCC)   Hypothyroidism   Biventricular ICD (implantable cardioverter-defibrillator) in place   S/P lumbar fusion   Pleuritic chest pain  #1 MSSA bacteremia Questionable etiology. Patient was transferred from outside hospital due to concern for possible endocarditis and possible pacemaker lead infection. Pacemaker site looks clean dry and intact. Patient underwent a transesophageal echocardiogram at outside hospital which was negative for vegetations. Patient currently afebrile. WBC normal. Continue empiric IV nafcillin. Consult with ID  for further evaluation and antibiotic duration and recommendations.Will consulted cardiology for evaluation for pacemaker lead infection and also endocarditis as this was the reason patient was transferred to [facility] from outside hospital.  #2 History of dilated cardiomyopathy/chronic systolic heart failure/biventricular ICD Stable. Compensated. Continue home regimen of Coreg, aspirin,  Cozaar.  #3 hypothyroidism Continue home dose Synthroid.  #5 status post lumbar fusion Site clean dry and intact. No signs of infection noted.  #6 hypertension Stable. Continue Coreg and losartan.  #7 hypokalemia Repleted.   DVT prophylaxis: Lovenox Code Status: Full Family Communication: Updated patient. Updated husband at bedside. Disposition Plan: Pending cardiology and ID evaluation as patient was transferred from outside hospital to [facility] for cardiology evaluation.   Consultants:   Cardiology pending  ID pending  Procedures:   Chest x-ray 06/21/2016  Antimicrobials:   IV nafcillin 06/21/2016>>>>    Subjective: Laying in bed. Denies any chest pain. No shortness of breath. No fevers noted overnight. Feeling better than on admission. Shadow chart from outside hospital reviewed.  Objective: Vitals:   06/21/16 1919 06/21/16 2317 06/22/16 0405  BP: (!) 157/71 (!) 158/67 (!) 147/65  Pulse: 72  77  Resp: 20 18 18   Temp: 98.2 F (36.8 C) 98.3 F (36.8 C) 98.4 F (36.9 C)  TempSrc: Oral  Oral  SpO2: 99%  95%  Weight: 73.8 kg (162 lb 11.2 oz)    Height: 4\' 10"  (1.473 m)      Intake/Output Summary (Last 24 hours) at 06/22/16 1016 Last data filed at 06/22/16 0603  Gross per 24 hour  Intake                0 ml  Output              100 ml  Net             -100 ml   Filed Weights   06/21/16 1919  Weight: 73.8 kg (162 lb 11.2 oz)    Examination:  General exam: Appears calm and comfortable  Respiratory system: Clear to auscultation. Respiratory effort normal. Cardiovascular system: S1 & S2 heard, RRR. No JVD, murmurs, rubs, gallops or clicks.  No pedal edema. Pacemaker site clean dry intact. No signs of infection noted. Nonerythematous. Nontender to palpation. Gastrointestinal system: Abdomen is nondistended, soft and nontender. No organomegaly or masses felt. Normal bowel sounds heard. Central nervous system: Alert and oriented. No focal neurological  deficits. Extremities: Symmetric 5 x 5 power. Skin: Lower back region with postsurgical scar which is clean dry and intact with good healing and no signs of fluctuance. No lesions or ulcers Psychiatry: Judgement and insight appear normal. Mood & affect appropriate.     Data Reviewed: I have personally reviewed following labs and imaging studies  CBC:  Recent Labs Lab 06/21/16 2153  WBC 6.1  NEUTROABS 4.5  HGB 10.2*  HCT 32.2*  MCV 92.5  PLT 252   Basic Metabolic Panel:  Recent Labs Lab 06/21/16 2153  NA 136  K 3.3*  CL 103  CO2 27  GLUCOSE 91  BUN 7  CREATININE 0.75  CALCIUM 8.2*   GFR: Estimated Creatinine Clearance: 49.5 mL/min (by C-G formula based on SCr of 0.75 mg/dL). Liver Function Tests:  Recent Labs Lab 06/21/16 2153  AST 20  ALT 9*  ALKPHOS 60  BILITOT 0.7  PROT 6.1*  ALBUMIN 2.4*   No results for input(s): LIPASE, AMYLASE in the last 168 hours. No results for input(s): AMMONIA in the last 168 hours. Coagulation Profile:  Recent Labs Lab 06/21/16 2153  INR 1.07   Cardiac Enzymes:  Recent Labs Lab 06/21/16 2153 06/22/16 0321  TROPONINI <0.03 <0.03   BNP (last 3 results) No results for input(s): PROBNP in the last 8760 hours. HbA1C: No results for input(s): HGBA1C in the last 72 hours. CBG: No results for input(s): GLUCAP in the last 168 hours. Lipid Profile: No results for input(s): CHOL, HDL, LDLCALC, TRIG, CHOLHDL, LDLDIRECT in the last 72 hours. Thyroid Function Tests: No results for input(s): TSH, T4TOTAL, FREET4, T3FREE, THYROIDAB in the last 72 hours. Anemia Panel: No results for input(s): VITAMINB12, FOLATE, FERRITIN, TIBC, IRON, RETICCTPCT in the last 72 hours. Sepsis Labs: No results for input(s): PROCALCITON, LATICACIDVEN in the last 168 hours.  No results found for this or any previous visit (from the past 240 hour(s)).       Radiology Studies: Dg Chest Port 1 View  Result Date: 06/21/2016 CLINICAL DATA:   Acute onset of generalized chest pain. Initial encounter. EXAM: PORTABLE CHEST 1 VIEW COMPARISON:  CTA of the chest performed 06/18/2016 FINDINGS: There is elevation of the right hemidiaphragm. Mild right basilar opacity may reflect atelectasis or mild pneumonia. Mild peribronchial thickening is seen. No pleural effusion or pneumothorax is seen. The cardiomediastinal silhouette is borderline normal in size. A pacemaker/AICD is noted overlying the left chest wall, with leads ending overlying the right atrium, right ventricle and coronary sinus. No acute osseous abnormalities are seen. IMPRESSION: Elevation of the right hemidiaphragm. Mild right basilar airspace opacity may reflect atelectasis or mild pneumonia. Mild peribronchial thickening seen. Electronically Signed   By: Roanna Raider M.D.   On: 06/21/2016 22:15        Scheduled Meds: . aspirin EC  81 mg Oral QPM  . carvedilol  12.5 mg Oral BID  . enoxaparin (LOVENOX) injection  40 mg Subcutaneous Q24H  . levothyroxine  112 mcg Oral QAC breakfast  . losartan  25 mg Oral Daily  . sodium chloride flush  3 mL Intravenous Q12H   Continuous Infusions: . nafcillin IV 2 g (06/22/16 0925)     LOS: 1 day    Time spent:  35 minutes    THOMPSON,DANIEL, MD [facility] Pager [phone]  If 7PM-7AM, please contact night-coverage [email or web address] Password [email or web address] 06/22/2016, 10:16 AM

## 2016-06-23 DIAGNOSIS — R0781 Pleurodynia: Secondary | ICD-10-CM

## 2016-06-23 LAB — BASIC METABOLIC PANEL
Anion gap: 8 (ref 5–15)
BUN: 7 mg/dL (ref 6–20)
CHLORIDE: 103 mmol/L (ref 101–111)
CO2: 25 mmol/L (ref 22–32)
CREATININE: 0.76 mg/dL (ref 0.44–1.00)
Calcium: 8.2 mg/dL — ABNORMAL LOW (ref 8.9–10.3)
GFR calc Af Amer: >60 mL/min (ref >60)
GLUCOSE: 85 mg/dL (ref 65–99)
POTASSIUM: 3.4 mmol/L — AB (ref 3.5–5.1)
SODIUM: 136 mmol/L (ref 135–145)

## 2016-06-23 LAB — CBC
HCT: 30.1 % — ABNORMAL LOW (ref 36.0–46.0)
Hemoglobin: 9.6 g/dL — ABNORMAL LOW (ref 12.0–15.0)
MCH: 29.6 pg (ref 26.0–34.0)
MCHC: 31.9 g/dL (ref 30.0–36.0)
MCV: 92.9 fL (ref 78.0–100.0)
PLATELETS: 251 10*3/uL (ref 150–400)
RBC: 3.24 MIL/uL — AB (ref 3.87–5.11)
RDW: 15 % (ref 11.5–15.5)
WBC: 6 10*3/uL (ref 4.0–10.5)

## 2016-06-23 LAB — FOLATE: Folate: 13.9 ng/mL (ref >5.9)

## 2016-06-23 LAB — VITAMIN B12: VITAMIN B 12: 1089 pg/mL — AB (ref 180–914)

## 2016-06-23 LAB — IRON AND TIBC
Iron: 51 ug/dL (ref 28–170)
SATURATION RATIOS: 23 % (ref 10.4–31.8)
TIBC: 218 ug/dL — AB (ref 250–450)
UIBC: 167 ug/dL

## 2016-06-23 LAB — FERRITIN: FERRITIN: 106 ng/mL (ref 11–307)

## 2016-06-23 MED ORDER — TRAMADOL HCL 50 MG PO TABS
50.0000 mg | ORAL_TABLET | Freq: Four times a day (QID) | ORAL | Status: DC | PRN
  Administered 2016-06-23 – 2016-06-24 (×4): 50 mg via ORAL
  Filled 2016-06-23 (×4): qty 1

## 2016-06-23 MED ORDER — SACCHAROMYCES BOULARDII 250 MG PO CAPS
250.0000 mg | ORAL_CAPSULE | Freq: Two times a day (BID) | ORAL | Status: DC
  Administered 2016-06-23 – 2016-06-24 (×2): 250 mg via ORAL
  Filled 2016-06-23 (×4): qty 1

## 2016-06-23 MED ORDER — POTASSIUM CHLORIDE CRYS ER 20 MEQ PO TBCR
40.0000 meq | EXTENDED_RELEASE_TABLET | Freq: Once | ORAL | Status: AC
  Administered 2016-06-23: 40 meq via ORAL

## 2016-06-23 NOTE — Progress Notes (Signed)
    [facility]   Reason for visit: Follow up on MSSA bacteremia  Interval History: repeat blood cultures here ngtd; no fever, no associated n/v/d.  No rashes.   Physical Exam: Constitutional:  Vitals:   06/22/16 2006 06/23/16 0519  BP: (!) 147/76 (!) 158/76  Pulse: 74 77  Resp: 18 18  Temp: 99.1 F (37.3 C) 98.4 F (36.9 C)   patient appears in NAD Respiratory: Normal respiratory effort; CTA B Cardiovascular: RRR GI: soft, nt, nd  Review of Systems: Constitutional: negative for fevers, chills, fatigue and anorexia Gastrointestinal: negative for diarrhea  Lab Results  Component Value Date   WBC 6.0 06/23/2016   HGB 9.6 (L) 06/23/2016   HCT 30.1 (L) 06/23/2016   MCV 92.9 06/23/2016   PLT 251 06/23/2016    Lab Results  Component Value Date   CREATININE 0.76 06/23/2016   BUN 7 06/23/2016   NA 136 06/23/2016   K 3.4 (L) 06/23/2016   CL 103 06/23/2016   CO2 25 06/23/2016    Lab Results  Component Value Date   ALT 9 (L) 06/21/2016   AST 20 06/21/2016   ALKPHOS 60 06/21/2016     Microbiology: Recent Results (from the past 240 hour(s))  Culture, blood (Routine X 2) w Reflex to ID Panel     Status: None (Preliminary result)   Collection Time: 06/21/16 10:05 PM  Result Value Ref Range Status   Specimen Description BLOOD RIGHT HAND  Final   Special Requests IN PEDIATRIC BOTTLE Blood Culture adequate volume  Final   Culture NO GROWTH 2 DAYS  Final   Report Status PENDING  Incomplete  Culture, blood (Routine X 2) w Reflex to ID Panel     Status: None (Preliminary result)   Collection Time: 06/21/16 10:08 PM  Result Value Ref Range Status   Specimen Description BLOOD LEFT HAND  Final   Special Requests   Final    BOTTLES DRAWN AEROBIC ONLY Blood Culture adequate volume   Culture NO GROWTH 2 DAYS  Final   Report Status PENDING  Incomplete    Impression/Plan:  1. MSSA bacteremia - TEE negative for vegetations.  Will get 4 weeks of IV  cefazolin through July 5th.  Will do blood cultures after that.   Remove picc line after July 5th by home health.   2. ICD/PM - no signs of vegetation.  Seen by Dr. Ladona Ridgel and no current plans for removal.  Patient knows to call if she develops any further fever but clinically doing well now.  As above, will repeat blood cultures 3-5 days after stopping antibiotics.    I will arrange follow up.    Ok for picc line   I will sign off. thanks

## 2016-06-23 NOTE — Care Management Note (Addendum)
Case Management Note  Patient Details  Name: [name] [name] MRN: [id number] Date of Birth: 30-Apr-1935  Subjective/Objective:                  staph bacteremia Action/Plan: Discharge planning Expected Discharge Date:                  Expected Discharge Plan:  Home w Home Health Services  In-House Referral:     Discharge planning Services  CM Consult  Post Acute Care Choice:  Home Health Choice offered to:  Spouse, Patient  DME Arranged:  IV pump/equipment DME Agency:  [facility]  HH Arranged:  RN, PT, OT, Nurse's Aide [facility] Agency:  [facility]  Status of Service:  In process, will continue to follow  If discussed at Long Length of Stay Meetings, dates discussed:    Additional Comments: CM received callback from [facility] who declines disciplines but can provide pharmacy.  CM reached out to [employer], [name] who accepts both the pharmacy and discipline aspects of the referral.  CM spoke with the family and explained the demographic challenge.  CM called [facility] rep, Jermaine to cancel the pharmacy aspect of the referral.  TOTAL REFERRAL WILL be through [facility].  No other CM needs were communicated.  CM spoke with spouse, [name] [name] for choice of home health agency.  Family choose [facility] to render HHRN/PT/OT/aide and IV ABX. Referral called to [facility] rep, Jermaine and am now waiting for callback on acceptance. Yves Dill, RN 06/23/2016, 11:47 AM

## 2016-06-23 NOTE — Progress Notes (Signed)
PROGRESS NOTE    [name] [name]  ZOX:[id number] DOB: 1935/09/10 DOA: 06/21/2016 PCP: [name], MD    Brief Narrative:  Patient is a 81 year old female history of coronary artery disease, pacemaker placement/AICD, hypertension, hypothyroidism, status post lumbar laminectomy and fusion presenting to the ED around of hospital 06/13/2016 for evaluation of fever nausea vomiting generalized weakness. Patient noted to be septic at outside hospital and subsequently found to have MSSA bacteremia and placed on nafcillin. Also at outside hospitals concern for possible pneumonia and patient received antibiotics of vancomycin, Rocephin, Levaquin and doxycycline. Patient also with complaints of pleuritic chest pain prompted repeated chest imaging a CT angiogram was negative for PE.   Assessment & Plan:   Principal Problem:   MSSA bacteremia Active Problems:   Congestive dilated cardiomyopathy (HCC)   Chronic systolic CHF (congestive heart failure), NYHA class 3 (HCC)   Hypothyroidism   Biventricular ICD (implantable cardioverter-defibrillator) in place   S/P lumbar fusion   Pleuritic chest pain  #1 MSSA bacteremia Questionable etiology. Patient was transferred from outside hospital due to concern for possible endocarditis and possible pacemaker lead infection. Pacemaker site looks clean dry and intact. Patient underwent a transesophageal echocardiogram at outside hospital which was negative for vegetations. Patient currently afebrile. WBC normal. IV antibiotics of nafcillin haven't changed to IV Ancef per ID recommendations. Patient has been seen in consultation by cardiology/EP will recommend current treatment with empiric IV antibiotics with close observation and if any signs of recurrent infection will likely need a device removed. Patient will continue on IV antibiotics for 4 weeks followed by surveillance cultures. ID and cardiology following.   #2 History of dilated  cardiomyopathy/chronic systolic heart failure/biventricular ICD Stable. Compensated. Continue home regimen of Coreg, aspirin, Cozaar.  #3 hypothyroidism Continue home dose Synthroid.  #5 status post lumbar fusion Site clean dry and intact. No signs of infection noted.  #6 hypertension Stable. Continue Coreg and losartan.  #7 hypokalemia Replete.   DVT prophylaxis: Lovenox Code Status: Full Family Communication: Updated patient. Updated husband at bedside. Disposition Plan: Hopefully home tomorrow once PICC line is placed and patient is afebrile.    Consultants:   Cardiology: Dr. Ladona Ridgel 06/22/2016  ID: Dr Luciana Axe 06/23/2015  Procedures:   Chest x-ray 06/21/2016  PICC line pending  Antimicrobials:   IV nafcillin 06/21/2016>>>>06/23/2016  IV Ancef 06/23/2015   Subjective: Laying in bed. Denies any chest pain. No shortness of breath. No fevers noted overnight. Patient complaining of headache. Patient feels fine otherwise.   Objective: Vitals:   06/22/16 0405 06/22/16 1324 06/22/16 2006 06/23/16 0519  BP: (!) 147/65 (!) 158/67 (!) 147/76 (!) 158/76  Pulse: 77 75 74 77  Resp: 18 17 18 18   Temp: 98.4 F (36.9 C) 97.7 F (36.5 C) 99.1 F (37.3 C) 98.4 F (36.9 C)  TempSrc: Oral Oral Oral Oral  SpO2: 95% 98% 97% 98%  Weight:      Height:        Intake/Output Summary (Last 24 hours) at 06/23/16 1116 Last data filed at 06/23/16 0517  Gross per 24 hour  Intake              460 ml  Output             1551 ml  Net            -1091 ml   Filed Weights   06/21/16 1919  Weight: 73.8 kg (162 lb 11.2 oz)    Examination:  General exam:  Appears calm and comfortable  Respiratory system: Clear to auscultation. Respiratory effort normal. Cardiovascular system: S1 & S2 heard, RRR. No JVD, murmurs, rubs, gallops or clicks. No pedal edema. Pacemaker site clean dry intact. No signs of infection noted. Nonerythematous. Nontender to palpation. Gastrointestinal system: Abdomen  is nondistended, soft and nontender. No organomegaly or masses felt. Normal bowel sounds heard. Central nervous system: Alert and oriented. No focal neurological deficits. Extremities: Symmetric 5 x 5 power. Skin: Lower back region with postsurgical scar which is clean dry and intact with good healing and no signs of fluctuance. No lesions or ulcers Psychiatry: Judgement and insight appear normal. Mood & affect appropriate.     Data Reviewed: I have personally reviewed following labs and imaging studies  CBC:  Recent Labs Lab 06/21/16 2153 06/22/16 1048 06/23/16 0209  WBC 6.1 6.5 6.0  NEUTROABS 4.5 4.9  --   HGB 10.2* 10.3* 9.6*  HCT 32.2* 31.6* 30.1*  MCV 92.5 92.1 92.9  PLT 252 238 251   Basic Metabolic Panel:  Recent Labs Lab 06/21/16 2153 06/22/16 1048 06/23/16 0209  NA 136 135 136  K 3.3* 3.7 3.4*  CL 103 102 103  CO2 27 25 25   GLUCOSE 91 93 85  BUN 7 8 7   CREATININE 0.75 0.78 0.76  CALCIUM 8.2* 8.3* 8.2*  MG  --  1.9  --    GFR: Estimated Creatinine Clearance: 49.5 mL/min (by C-G formula based on SCr of 0.76 mg/dL). Liver Function Tests:  Recent Labs Lab 06/21/16 2153  AST 20  ALT 9*  ALKPHOS 60  BILITOT 0.7  PROT 6.1*  ALBUMIN 2.4*   No results for input(s): LIPASE, AMYLASE in the last 168 hours. No results for input(s): AMMONIA in the last 168 hours. Coagulation Profile:  Recent Labs Lab 06/21/16 2153  INR 1.07   Cardiac Enzymes:  Recent Labs Lab 06/21/16 2153 06/22/16 0321 06/22/16 1048  TROPONINI <0.03 <0.03 <0.03   BNP (last 3 results) No results for input(s): PROBNP in the last 8760 hours. HbA1C: No results for input(s): HGBA1C in the last 72 hours. CBG: No results for input(s): GLUCAP in the last 168 hours. Lipid Profile: No results for input(s): CHOL, HDL, LDLCALC, TRIG, CHOLHDL, LDLDIRECT in the last 72 hours. Thyroid Function Tests: No results for input(s): TSH, T4TOTAL, FREET4, T3FREE, THYROIDAB in the last 72  hours. Anemia Panel: No results for input(s): VITAMINB12, FOLATE, FERRITIN, TIBC, IRON, RETICCTPCT in the last 72 hours. Sepsis Labs: No results for input(s): PROCALCITON, LATICACIDVEN in the last 168 hours.  Recent Results (from the past 240 hour(s))  Culture, blood (Routine X 2) w Reflex to ID Panel     Status: None (Preliminary result)   Collection Time: 06/21/16 10:05 PM  Result Value Ref Range Status   Specimen Description BLOOD RIGHT HAND  Final   Special Requests IN PEDIATRIC BOTTLE Blood Culture adequate volume  Final   Culture NO GROWTH 2 DAYS  Final   Report Status PENDING  Incomplete  Culture, blood (Routine X 2) w Reflex to ID Panel     Status: None (Preliminary result)   Collection Time: 06/21/16 10:08 PM  Result Value Ref Range Status   Specimen Description BLOOD LEFT HAND  Final   Special Requests   Final    BOTTLES DRAWN AEROBIC ONLY Blood Culture adequate volume   Culture NO GROWTH 2 DAYS  Final   Report Status PENDING  Incomplete         Radiology Studies: Dg  Chest Port 1 View  Result Date: 06/21/2016 CLINICAL DATA:  Acute onset of generalized chest pain. Initial encounter. EXAM: PORTABLE CHEST 1 VIEW COMPARISON:  CTA of the chest performed 06/18/2016 FINDINGS: There is elevation of the right hemidiaphragm. Mild right basilar opacity may reflect atelectasis or mild pneumonia. Mild peribronchial thickening is seen. No pleural effusion or pneumothorax is seen. The cardiomediastinal silhouette is borderline normal in size. A pacemaker/AICD is noted overlying the left chest wall, with leads ending overlying the right atrium, right ventricle and coronary sinus. No acute osseous abnormalities are seen. IMPRESSION: Elevation of the right hemidiaphragm. Mild right basilar airspace opacity may reflect atelectasis or mild pneumonia. Mild peribronchial thickening seen. Electronically Signed   By: Roanna Raider M.D.   On: 06/21/2016 22:15        Scheduled Meds: . aspirin  EC  81 mg Oral QPM  . carvedilol  12.5 mg Oral BID  . enoxaparin (LOVENOX) injection  40 mg Subcutaneous Q24H  . levothyroxine  112 mcg Oral QAC breakfast  . losartan  25 mg Oral Daily  . sodium chloride flush  3 mL Intravenous Q12H   Continuous Infusions: .  ceFAZolin (ANCEF) IV 2 g (06/23/16 0537)     LOS: 2 days    Time spent: 35 minutes    THOMPSON,DANIEL, MD [facility] Pager [phone]  If 7PM-7AM, please contact night-coverage [email or web address] Password [email or web address] 06/23/2016, 11:16 AM

## 2016-06-24 ENCOUNTER — Inpatient Hospital Stay (HOSPITAL_COMMUNITY): Payer: Medicare Other

## 2016-06-24 LAB — BASIC METABOLIC PANEL
ANION GAP: 8 (ref 5–15)
BUN: 6 mg/dL (ref 6–20)
CALCIUM: 8.2 mg/dL — AB (ref 8.9–10.3)
CHLORIDE: 104 mmol/L (ref 101–111)
CO2: 22 mmol/L (ref 22–32)
Creatinine, Ser: 0.76 mg/dL (ref 0.44–1.00)
GFR calc non Af Amer: >60 mL/min (ref >60)
GLUCOSE: 93 mg/dL (ref 65–99)
Potassium: 3.8 mmol/L (ref 3.5–5.1)
Sodium: 134 mmol/L — ABNORMAL LOW (ref 135–145)

## 2016-06-24 LAB — CBC WITH DIFFERENTIAL/PLATELET
BASOS PCT: 0 %
Basophils Absolute: 0 10*3/uL (ref 0.0–0.1)
Eosinophils Absolute: 0.1 10*3/uL (ref 0.0–0.7)
Eosinophils Relative: 2 %
HEMATOCRIT: 32.8 % — AB (ref 36.0–46.0)
HEMOGLOBIN: 10.5 g/dL — AB (ref 12.0–15.0)
LYMPHS ABS: 1.2 10*3/uL (ref 0.7–4.0)
Lymphocytes Relative: 17 %
MCH: 30.4 pg (ref 26.0–34.0)
MCHC: 32 g/dL (ref 30.0–36.0)
MCV: 95.1 fL (ref 78.0–100.0)
MONO ABS: 0.5 10*3/uL (ref 0.1–1.0)
MONOS PCT: 7 %
NEUTROS ABS: 5 10*3/uL (ref 1.7–7.7)
NEUTROS PCT: 74 %
Platelets: 228 10*3/uL (ref 150–400)
RBC: 3.45 MIL/uL — ABNORMAL LOW (ref 3.87–5.11)
RDW: 15.5 % (ref 11.5–15.5)
WBC: 6.7 10*3/uL (ref 4.0–10.5)

## 2016-06-24 MED ORDER — HEPARIN SOD (PORK) LOCK FLUSH 100 UNIT/ML IV SOLN
250.0000 [IU] | INTRAVENOUS | Status: AC | PRN
  Administered 2016-06-24: 250 [IU]

## 2016-06-24 MED ORDER — SODIUM CHLORIDE 0.9% FLUSH: 10.0000 mL | INTRAVENOUS | Status: DC | PRN

## 2016-06-24 MED ORDER — CEFAZOLIN IV (FOR PTA / DISCHARGE USE ONLY): 2.0000 g | Freq: Three times a day (TID) | INTRAVENOUS | 0 refills | Status: DC

## 2016-06-24 MED ORDER — SODIUM CHLORIDE 0.9% FLUSH: 10.0000 mL | Freq: Two times a day (BID) | INTRAVENOUS | Status: DC

## 2016-06-24 NOTE — Progress Notes (Signed)
06/24/2016 6:52 PM Discharge AVS meds taken today and those due this evening reviewed.  Follow-up appointments and when to call md reviewed.  D/C IV and TELE.  Questions and concerns addressed.   D/C home per orders. Kathryne Hitch

## 2016-06-24 NOTE — Discharge Summary (Signed)
Physician Discharge Summary  [name] [name] YBO:[id number] DOB: 04-27-1935 DOA: 06/21/2016  PCP: Leonard Downing, MD  Admit date: 06/21/2016 Discharge date: 06/24/2016  Time spent: 65 minutes  Recommendations for Outpatient Follow-up:  1. Follow-up with Dr Linus Salmons, ID in about 2-3 weeks. Patient will need surveillance cultures done after IV antibiotics are completed. 2. Follow-up with Dr. Caryl Comes cardiology 07/24/2016. 3. Follow-up with Leonard Downing, MD in 2 weeks.   Discharge Diagnoses:  Principal Problem:   MSSA bacteremia Active Problems:   Congestive dilated cardiomyopathy (HCC)   Chronic systolic CHF (congestive heart failure), NYHA class 3 (HCC)   Hypothyroidism   Biventricular ICD (implantable cardioverter-defibrillator) in place   S/P lumbar fusion   Pleuritic chest pain   Discharge Condition: stable  Diet recommendation: heart healthy  Filed Weights   06/21/16 1919  Weight: 73.8 kg (162 lb 11.2 oz)    History of present illness:  Per Dr. Whitney Muse is a 81 y.o. woman with a history of CAD, prior MI, CHF, PPM/AICD placement, HTN, and hypothyroidism who is S/P lumbar laminectomy and fusion in April.  She presented to the ED at [facility] on 6/7 for evaluation of fever, nausea/vomiting, and generalized weakness.  She ultimately developed sepsis with hypotension and mild AKI and was found to have MSSA bacteremia (2 of 2 cultures positive from 6/7; repeat cultures from 6/9 without growth to date).  Source of bacteremia was initially unclear.  She had an LP on 6/7 and CT chest suggested RML and lingula infiltrates. Antibiotics were changed from vancomycin and rocephin to Levaquin.  She was on doxycycline briefly as well.  She eventually developed pleuritic chest pain with prompted repeat chest imaging.  CTA chest was negative for PE.  Surface echo was negative for vegetation.  She was referred for TEE and plan were also made to transfer her to [facility] so  that she could be evaluated her cardiologists here Meda Coffee, Caryl Comes).  Pertinent labs from the outside hospital included normal WBC count.  Hgb 10.  BUN 9.  Creatinine 0.6.  On admission, she had a mild headache.  No chest pain or shortness of breath.  She had been light-headed but no syncope.  No nausea.  She was asking for her nightly muscle relaxer for leg and neck spasms.  Hospital Course:  #1 MSSA bacteremia Questionable etiology. Patient was transferred from outside hospital due to concern for possible endocarditis and possible pacemaker lead infection. Pacemaker site looked clean dry. Patient underwent a transesophageal echocardiogram at outside hospital which was negative for vegetations. Patient currently afebrile. WBC normal. IV antibiotics of nafcillin were changed to IV Ancef per ID recommendations. Patient was seen in consultation by cardiology/EP who recommended current treatment with empiric IV antibiotics with close observation and if any signs of recurrent infection will likely need a device removed. Patient was maintained on IV Ancef and will need to be on IV antibiotics for 4 weeks followed by surveillance cultures. ID and cardiology followed the patient throughout the hospitalization and patient will have close outpatient follow-up. Patient be discharged in stable and improved condition.  Patient will be discharged with home health services.  #2 History of dilated cardiomyopathy/chronic systolic heart failure/biventricular ICD Stable. Compensated. Placed on  home regimen of Coreg, aspirin, Cozaar.  #3 hypothyroidism Patient maintained on home regimen of Synthroid.  #5 status post lumbar fusion Site clean dry and intact. No signs of infection noted.  #6 hypertension Stable. Patient maintained on home regimen  of Coreg and losartan.  #7 hypokalemia Repleted.   Procedures:  Chest x-ray 06/21/2016  PICC line 06/24/2016  Consultations:  Cardiology: Dr. Lovena Le  06/22/2016  ID: Dr Linus Salmons 06/23/2015   Discharge Exam: Vitals:   06/24/16 0420 06/24/16 0959  BP: (!) 158/78 (!) 144/57  Pulse: 85 83  Resp: 18   Temp: 98.6 F (37 C)     General: NAD Cardiovascular: RRR Respiratory: CTAB  Discharge Instructions   Discharge Instructions    Diet - low sodium heart healthy    Complete by:  As directed    Home infusion instructions [facility] May follow [place] Dosing Protocol; May administer Cathflo as needed to maintain patency of vascular access device.; Flushing of vascular access device: per [facility] Protocol: 0.9% NaCl pre/post medica...    Complete by:  As directed    Instructions:  May follow [place] Dosing Protocol   Instructions:  May administer Cathflo as needed to maintain patency of vascular access device.   Instructions:  Flushing of vascular access device: per [facility] Protocol: 0.9% NaCl pre/post medication administration and prn patency; Heparin 100 u/ml, 88m for implanted ports and Heparin 10u/ml, 58mfor all other central venous catheters.   Instructions:  May follow [facility] Anaphylaxis Protocol for First Dose Administration in the home: 0.9% NaCl at 25-50 ml/hr to maintain IV access for protocol meds. Epinephrine 0.3 ml IV/IM PRN and Benadryl 25-50 IV/IM PRN s/s of anaphylaxis.   Instructions:  [facility] Coord[facility] (RN) to assist per patient IV care needs in the home PRN.   Increase activity slowly    Complete by:  As directed      Current Discharge Medication List    START taking these medications   Details  ceFAZolin (ANCEF) IVPB Inject 2 g into the vein every 8 (eight) hours. Indication:  MSSA bacteremia Last Day of Therapy:  07/11/2016 Labs - Once weekly:  CBC/D and BMP, Labs - Every other week:  ESR and CRP Qty: 57 Units, Refills: 0      CONTINUE these medications which have NOT CHANGED   Details  albuterol (PROAIR HFA) 108 (90 Base) MCG/ACT inhaler Inhale 2 puffs into the lungs every 6 (six)  hours as needed for wheezing or shortness of breath.    aspirin EC 81 MG tablet Take 1 tablet (81 mg total) by mouth daily. Qty: 90 tablet, Refills: 3   Associated Diagnoses: Typical angina ([place] Pre-procedure lab exam    aspirin-acetaminophen-caffeine (EXCEDRIN MIGRAINE) 250-250-65 MG per tablet Take 1-2 tablets by mouth every 6 (six) hours as needed for headache or migraine.     augmented betamethasone dipropionate (DIPROLENE-AF) 0.05 % ointment Apply 1 application topically daily as needed (acrodermatitis [place] on fingers/toes).     Calcium Carbonate Antacid (TUMS PO) Take 2 tablets by mouth daily as needed (indigestion).    carvedilol (COREG) 12.5 MG tablet Take 1 tablet (12.5 mg total) by mouth 2 (two) times daily. Qty: 180 tablet, Refills: 3    furosemide (LASIX) 20 MG tablet Take 1 tablet (20 mg total) by mouth daily as needed for edema. Qty: 90 tablet, Refills: 3   Associated Diagnoses: Precordial pain; Chronic systolic CHF (congestive heart failure), NYHA class 3 ([place] Bilateral low back pain with sciatica, sciatica laterality unspecified; Congestive dilated cardiomyopathy (HCC)    levothyroxine (SYNTHROID, LEVOTHROID) 112 MCG tablet Take 112 mcg by mouth daily before breakfast.    losartan (COZAAR) 25 MG tablet Take 1 tablet (25 mg total) by  mouth daily. Qty: 90 tablet, Refills: 3   Associated Diagnoses: Essential hypertension; Biventricular ICD (implantable cardioverter-defibrillator) in place; Chronic systolic CHF (congestive heart failure), NYHA class 3 ([place]); Nonischemic cardiomyopathy ([place]); Medication management; ICD (implantable cardioverter-defibrillator), biventricular, in situ; Acute bronchitis, unspecified organism    Melatonin 5 MG TABS Take 5 mg by mouth at bedtime as needed (for sleep.).    methocarbamol (ROBAXIN) 750 MG tablet Take 1 tablet (750 mg total) by mouth 2 (two) times daily as needed. For leg spasms. Qty: 60 tablet, Refills: 1    methylphenidate  (RITALIN) 20 MG tablet Take 20 mg by mouth 2 (two) times daily as needed (narcolepsy).     Naphazoline-Pheniramine (ALLERGY EYE OP) Place 1-2 drops into both eyes daily as needed (for irritated eyes).    ondansetron (ZOFRAN-ODT) 4 MG disintegrating tablet Take 4 mg by mouth every 6 (six) hours as needed. Refills: 0    Probiotic Product (PROBIOTIC PO) Take 1 capsule by mouth at bedtime.     ondansetron (ZOFRAN) 4 MG tablet Take 0.5 tablets (2 mg total) by mouth every 8 (eight) hours as needed for nausea or vomiting. Qty: 20 tablet, Refills: 3      STOP taking these medications     amoxicillin (AMOXIL) 500 MG capsule        Allergies  Allergen Reactions  . Sulfa Antibiotics Hives   Follow-up Information    [place], [place] Follow up.   Specialty:  [place] Why:  This will be the home health agency who will manage your PICC line and IV antibiotics and your therapy. Contact information: [place] [address] [phone]        Deboraha Sprang, MD Follow up on 07/24/2016.   Specialty:  Cardiology Why:  at 9:15AM Contact information: [place]. [address] [place] [address] [phone]        Thayer Headings, MD. Schedule an appointment as soon as possible for a visit in 1 week(s).   Specialty:  Infectious Diseases Why:  f/u in 2-3 weeks. Contact information: [address] [place] [address] [phone]        Leonard Downing, MD. Schedule an appointment as soon as possible for a visit in 2 week(s).   Specialty:  Family Medicine Contact information: [place] [address] [phone]            The results of significant diagnostics from this hospitalization (including imaging, microbiology, ancillary and laboratory) are listed below for reference.    Significant Diagnostic Studies: Dg Chest Port 1 View  Result Date: 06/24/2016 CLINICAL  DATA:  Check PICC line placement EXAM: PORTABLE CHEST 1 VIEW COMPARISON:  06/21/2016 FINDINGS: Newly placed right-sided PICC line is identified with the tip in the superior aspect of the right atrium just below the cavoatrial junction. This could be withdrawn 1 cm as necessary. Defibrillator is again seen on the left. The lungs are clear. Cardiac shadow is stable. IMPRESSION: PICC line as described. Electronically Signed   By: Inez Catalina M.D.   On: 06/24/2016 11:29   Dg Chest Port 1 View  Result Date: 06/21/2016 CLINICAL DATA:  Acute onset of generalized chest pain. Initial encounter. EXAM: PORTABLE CHEST 1 VIEW COMPARISON:  CTA of the chest performed 06/18/2016 FINDINGS: There is elevation of the right hemidiaphragm. Mild right basilar opacity may reflect atelectasis or mild pneumonia. Mild peribronchial thickening is seen. No pleural effusion or  pneumothorax is seen. The cardiomediastinal silhouette is borderline normal in size. A pacemaker/AICD is noted overlying the left chest wall, with leads ending overlying the right atrium, right ventricle and coronary sinus. No acute osseous abnormalities are seen. IMPRESSION: Elevation of the right hemidiaphragm. Mild right basilar airspace opacity may reflect atelectasis or mild pneumonia. Mild peribronchial thickening seen. Electronically Signed   By: Garald Balding M.D.   On: 06/21/2016 22:15    Microbiology: Recent Results (from the past 240 hour(s))  Culture, blood (Routine X 2) w Reflex to ID Panel     Status: None (Preliminary result)   Collection Time: 06/21/16 10:05 PM  Result Value Ref Range Status   Specimen Description BLOOD RIGHT HAND  Final   Special Requests IN PEDIATRIC BOTTLE Blood Culture adequate volume  Final   Culture NO GROWTH 3 DAYS  Final   Report Status PENDING  Incomplete  Culture, blood (Routine X 2) w Reflex to ID Panel     Status: None (Preliminary result)   Collection Time: 06/21/16 10:08 PM  Result Value Ref Range Status    Specimen Description BLOOD LEFT HAND  Final   Special Requests   Final    BOTTLES DRAWN AEROBIC ONLY Blood Culture adequate volume   Culture NO GROWTH 3 DAYS  Final   Report Status PENDING  Incomplete     Labs: Basic Metabolic Panel:  Recent Labs Lab 06/21/16 2153 06/22/16 1048 06/23/16 0209 06/24/16 0758  NA 136 135 136 134*  K 3.3* 3.7 3.4* 3.8  CL 103 102 103 104  CO2 '27 25 25 22  '$ GLUCOSE 91 93 85 93  BUN '7 8 7 6  '$ CREATININE 0.75 0.78 0.76 0.76  CALCIUM 8.2* 8.3* 8.2* 8.2*  MG  --  1.9  --   --    Liver Function Tests:  Recent Labs Lab 06/21/16 2153  AST 20  ALT 9*  ALKPHOS 60  BILITOT 0.7  PROT 6.1*  ALBUMIN 2.4*   No results for input(s): LIPASE, AMYLASE in the last 168 hours. No results for input(s): AMMONIA in the last 168 hours. CBC:  Recent Labs Lab 06/21/16 2153 06/22/16 1048 06/23/16 0209 06/24/16 0758  WBC 6.1 6.5 6.0 6.7  NEUTROABS 4.5 4.9  --  5.0  HGB 10.2* 10.3* 9.6* 10.5*  HCT 32.2* 31.6* 30.1* 32.8*  MCV 92.5 92.1 92.9 95.1  PLT 252 238 251 228   Cardiac Enzymes:  Recent Labs Lab 06/21/16 2153 06/22/16 0321 06/22/16 1048  TROPONINI <0.03 <0.03 <0.03   BNP: BNP (last 3 results)  Recent Labs  12/07/15 0934  BNP 33.0    ProBNP (last 3 results) No results for input(s): PROBNP in the last 8760 hours.  CBG: No results for input(s): GLUCAP in the last 168 hours.     SignedIrine Seal MD.  [facility] 06/24/2016, 5:33 PM

## 2016-06-24 NOTE — Progress Notes (Signed)
Peripherally Inserted Central Catheter/Midline Placement  The IV Nurse has discussed with the patient and/or persons authorized to consent for the patient, the purpose of this procedure and the potential benefits and risks involved with this procedure.  The benefits include less needle sticks, lab draws from the catheter, and the patient may be discharged home with the catheter. Risks include, but not limited to, infection, bleeding, blood clot (thrombus formation), and puncture of an artery; nerve damage and irregular heartbeat and possibility to perform a PICC exchange if needed/ordered by physician.  Alternatives to this procedure were also discussed.  Bard Power PICC patient education guide, fact sheet on infection prevention and patient information card has been provided to patient /or left at bedside.    PICC/Midline Placement Documentation  PICC Single Lumen 06/24/16 PICC Right Brachial 39 cm 0 cm (Active)  Indication for Insertion or Continuance of Line Prolonged intravenous therapies 06/24/2016  9:37 AM  Exposed Catheter (cm) 0 cm 06/24/2016  9:37 AM  Site Assessment Clean;Dry;Intact 06/24/2016  9:37 AM  Line Status Flushed;Saline locked;Blood return noted 06/24/2016  9:37 AM  Dressing Type Transparent;Securing device 06/24/2016  9:37 AM  Dressing Status Clean;Dry;Intact;Antimicrobial disc in place 06/24/2016  9:37 AM  Dressing Change Due 07/01/16 06/24/2016  9:37 AM       Romie Jumper 06/24/2016, 9:39 AM

## 2016-06-24 NOTE — Evaluation (Signed)
Occupational Therapy Evaluation Patient Details Name: [name] [name] MRN: [id number] DOB: 1935/04/03 Today's Date: 06/24/2016    History of Present Illness Pt is a 81 yo female admitted from [facility] on 06/21/16. Pt was admitted to [facility] on 06/13/16 with a fever, nausea and vomiting and generalized weakness. Pt developed sepsis, hypotension and mild AKI and was diagnosed with MSSA bacteremia. Source is still in question, now considering leads from implaned defibrillator. PMH significant for lumbar surgery april, CHF, cardiomyopathy, hypothyroidism, biventricular ICD.     Clinical Impression   PTA, pt was living with her husband and was independent. Currently, pt requires Min Guard A for ADLs and functional mobility using RW. Pt demonstrates decreased activity tolerance and generalize weakness impacting her occupational performance. Answered all pt questions in preparation for dc today. Recommend dc home with HHOT to further her independence and safety with ADLs. All further OT needs may be met with HHOT.     Follow Up Recommendations  Home health OT;Supervision/Assistance - 24 hour    Equipment Recommendations  None recommended by OT    Recommendations for Other Services PT consult     Precautions / Restrictions Precautions Precautions: Fall Restrictions Weight Bearing Restrictions: No      Mobility Bed Mobility Overal bed mobility: Modified Independent             General bed mobility comments: able to get EOB without assistance  Transfers Overall transfer level: Needs assistance Equipment used: Rolling walker (2 wheeled) Transfers: Sit to/from Stand Sit to Stand: Min guard         General transfer comment: min guard for safety from EOB.     Balance Overall balance assessment: Needs assistance Sitting-balance support: No upper extremity supported;Feet supported Sitting balance-Leahy Scale: Normal     Standing balance support: No upper  extremity supported Standing balance-Leahy Scale: Fair                             ADL either performed or assessed with clinical judgement   ADL Overall ADL's : Needs assistance/impaired Eating/Feeding: Set up;Sitting   Grooming: Set up;Sitting   Upper Body Bathing: Set up;Sitting   Lower Body Bathing: Min guard;Sit to/from stand   Upper Body Dressing : Set up;Sitting Upper Body Dressing Details (indicate cue type and reason): Pt donned night gown Lower Body Dressing: Min guard;Sit to/from stand Lower Body Dressing Details (indicate cue type and reason): Pt donned underwear with min guard for safety in standing Toilet Transfer: Min guard;Ambulation (simulated to recliner)           Functional mobility during ADLs: Min guard;Supervision/safety;Rolling walker General ADL Comments: Pt very appreciative of therapy. Pt performing at [place] guard level; however, has decreased activity tolerance and would benefit from further [facility] services     Vision Baseline Vision/History: Wears glasses Wears Glasses: Reading only Patient Visual Report: No change from baseline       Perception     Praxis      Pertinent Vitals/Pain Pain Assessment: Faces Faces Pain Scale: Hurts a little bit Pain Location: lumbar region Pain Descriptors / Indicators: Grimacing;Guarding;Aching Pain Intervention(s): Monitored during session;Repositioned     Hand Dominance Right   Extremity/Trunk Assessment Upper Extremity Assessment Upper Extremity Assessment: Generalized weakness   Lower Extremity Assessment Lower Extremity Assessment: Generalized weakness   Cervical / Trunk Assessment Cervical / Trunk Assessment: Kyphotic (slight forward trunk lean)   Communication Communication Communication: No difficulties  Cognition Arousal/Alertness: Awake/alert Behavior During Therapy: WFL for tasks assessed/performed Overall Cognitive Status: Within Functional Limits for tasks assessed                                      General Comments       Exercises     Shoulder Instructions      Home Living Family/patient expects to be discharged to:: Private residence Living Arrangements: Spouse/significant other Available Help at Discharge: Family;Available 24 hours/day Type of Home: House Home Access: Stairs to enter [employer] of Steps: 1 little step   Home Layout: Multi-level;Able to live on main level with bedroom/bathroom     Bathroom Shower/Tub: Tub/shower unit;Curtain   Biochemist, clinical: Standard     Home Equipment: Clinical cytogeneticist - 2 wheels;Tub bench;Grab bars - tub/shower;Grab bars - toilet;Hand held shower head;Adaptive equipment;Toilet riser Adaptive Equipment: Reacher        Prior Functioning/Environment Level of Independence: Independent with assistive device(s)        Comments: Pt reports being I in ADLs, IADLs, and driving prior to sickness        OT Problem List: Decreased strength;Decreased range of motion;Decreased activity tolerance;Impaired balance (sitting and/or standing);Decreased safety awareness;Decreased knowledge of use of DME or AE;Decreased knowledge of precautions;Pain      OT Treatment/Interventions:      OT Goals(Current goals can be found in the care plan section) Acute Rehab OT Goals Patient Stated Goal: to get home  OT Goal Formulation: With patient Time For Goal Achievement: 07/08/16 Potential to Achieve Goals: Good  OT Frequency:     Barriers to D/C:            Co-evaluation              AM-PAC PT "6 Clicks" Daily Activity     Outcome Measure Help from another person eating meals?: None Help from another person taking care of personal grooming?: A Little Help from another person toileting, which includes using toliet, bedpan, or urinal?: A Little Help from another person bathing (including washing, rinsing, drying)?: A Little Help from another person to put on and taking off  regular upper body clothing?: None Help from another person to put on and taking off regular lower body clothing?: A Little 6 Click Score: 20   End of Session Nurse Communication: Mobility status  Activity Tolerance: Patient tolerated treatment well Patient left: in chair;with call bell/phone within reach;with family/visitor present  OT Visit Diagnosis: Unsteadiness on feet (R26.81);Other abnormalities of gait and mobility (R26.89);Muscle weakness (generalized) (M62.81);Pain Pain - Right/Left:  (Lower back) Pain - part of body:  (back)                Time: 4462-8638 OT Time Calculation (min): 18 min Charges:  OT General Charges $OT Visit: 1 Procedure OT Evaluation $OT Eval Low Complexity: 1 Procedure G-Codes:     Rayne Loiseau MSOT, OTR/L Acute Rehab Pager: [phone] Office: [place] 06/24/2016, 3:44 PM

## 2016-06-24 NOTE — Progress Notes (Signed)
PT Cancellation Note  Patient Details Name: [name] [name] MRN: [id number] DOB: [birth year]   Cancelled Treatment:    Reason Eval/Treat Not Completed: Other (comment) Pt s/p PICC placemen. Awaiting XRAys prior to mobility. Will follow up.   Blake Divine A Jadene Stemmer 06/24/2016, 11:09 AM  Mylo Red, PT, DPT [phone]

## 2016-06-24 NOTE — Care Management Note (Addendum)
Case Management Note Previous CM note initiated by Yves Dill, RN--06/23/2016, 11:47 AM   Patient Details  Name: RAETTA HUESTIS MRN: [id number] Date of Birth: 10/14/1935  Subjective/Objective:                  staph bacteremia Action/Plan: Discharge planning Expected Discharge Date:                  Expected Discharge Plan:  Home w Home Health Services  In-House Referral:     Discharge planning Services  CM Consult  Post Acute Care Choice:  Home Health Choice offered to:  Spouse, Patient  DME Arranged:  IV pump/equipment DME Agency:  [facility]  HH Arranged:  RN, PT, OT, Nurse's Aide, IV Antibiotics HH Agency:  [facility]  Status of Service:  Completed, signed off  If discussed at Long Length of Stay Meetings, dates discussed:    Additional Comments:   06/24/16- 0930- Donn Pierini RN, CM- pt for PICC line placement this AM- this possible d/c today- spoke with Kenard Gower at [facility] and confirmed [facility] arrangements and referral has been received- No further CM needs noted at this time.  [facility] rep- Madelaine Bhat has come by Pt's room to speak with her and educate regarding home IV abx needs.  Cm also spoke with pt at bedside and TC made to her husband regarding d/c plans. Call also returned to her daughter Corrie Dandy to answer questions about discharge arrangements.   Yves Dill, RN 06/23/2016, 11:47 AM--CM received callback from [facility] who declines disciplines but can provide pharmacy.  CM reached out to [employer], [name] who accepts both the pharmacy and discipline aspects of the referral.  CM spoke with the family and explained the demographic challenge.  CM called [facility] rep, Jermaine to cancel the pharmacy aspect of the referral.  TOTAL REFERRAL WILL be through [facility].  No other CM needs were communicated.  CM spoke with spouse, Leonette Most for choice of home health agency.  Family choose [facility] to render HHRN/PT/OT/aide and  IV ABX. Referral called to [facility] rep, Jermaine and am now waiting for callback on acceptance.   Zenda Alpers [place], RN 06/24/2016, 9:35 AM [phone]

## 2016-06-26 LAB — CULTURE, BLOOD (ROUTINE X 2)
CULTURE: NO GROWTH
Culture: NO GROWTH
SPECIAL REQUESTS: ADEQUATE
Special Requests: ADEQUATE

## 2016-07-11 ENCOUNTER — Ambulatory Visit
Admission: RE | Admit: 2016-07-11 | Discharge: 2016-07-11 | Disposition: A | Discharge status: Home / Self Care | Payer: Medicare Other | Source: Ambulatory Visit | Attending: Infectious Diseases | Admitting: Infectious Diseases

## 2016-07-11 ENCOUNTER — Encounter: Payer: Self-pay | Admitting: Infectious Diseases

## 2016-07-11 ENCOUNTER — Ambulatory Visit (INDEPENDENT_AMBULATORY_CARE_PROVIDER_SITE_OTHER): Payer: Medicare Other | Admitting: Infectious Diseases

## 2016-07-11 VITALS — BP 160/76 | HR 86 | Temp 97.8°F | Wt 156.0 lb

## 2016-07-11 DIAGNOSIS — M79671 Pain in right foot: Secondary | ICD-10-CM | POA: Diagnosis not present

## 2016-07-11 DIAGNOSIS — Z981 Arthrodesis status: Secondary | ICD-10-CM

## 2016-07-11 DIAGNOSIS — R7881 Bacteremia: Secondary | ICD-10-CM

## 2016-07-11 DIAGNOSIS — R51 Headache: Secondary | ICD-10-CM

## 2016-07-11 DIAGNOSIS — R519 Headache, unspecified: Secondary | ICD-10-CM

## 2016-07-11 DIAGNOSIS — Z95810 Presence of automatic (implantable) cardiac defibrillator: Secondary | ICD-10-CM | POA: Diagnosis not present

## 2016-07-11 DIAGNOSIS — B9561 Methicillin susceptible Staphylococcus aureus infection as the cause of diseases classified elsewhere: Secondary | ICD-10-CM

## 2016-07-11 LAB — CBC
HCT: 30.6 % — ABNORMAL LOW (ref 35.0–45.0)
HEMOGLOBIN: 9.9 g/dL — AB (ref 11.7–15.5)
MCH: 30.7 pg (ref 27.0–33.0)
MCHC: 32.4 g/dL (ref 32.0–36.0)
MCV: 94.7 fL (ref 80.0–100.0)
MPV: 9.9 fL (ref 7.5–12.5)
PLATELETS: 228 10*3/uL (ref 140–400)
RBC: 3.23 MIL/uL — AB (ref 3.80–5.10)
RDW: 15.9 % — ABNORMAL HIGH (ref 11.0–15.0)
WBC: 6.8 10*3/uL (ref 3.8–10.8)

## 2016-07-11 NOTE — Progress Notes (Signed)
X-rays without evidence of fracture. Do show changes in bones related to osteoarthritis specifically in the great toe joint and bone spur formation in her midfoot that is likely causing her pain when she walks. Would recommend tylenol for pain control, elevation and ice for 15 min a few times a day if she can. Would also try a dose of lasix to help with swelling since she can't get stockings on for now. Patient notified of the above and discussed over the phone. Will await inflammatory markers to be certain no MRI FU needed.

## 2016-07-11 NOTE — Assessment & Plan Note (Signed)
Relatively new since admission for sepsis/bacteremia. She has documented osteoporosis on recent DEXA scan. It is possible she sustained stress fracture at some point during/prior to hospitalization (she does not remember much leading into admission and prior to transfer to [facility]). Other differentials include: septic arthritis (although she has been treated with appropriate course of therapy with 4 weeks Ancef), Gout and osteoarthritis. Will check Foot XRays, Sed Rate / CRP and CBC today. If x-rays unrevealing and inflammatory markers elevated will bridge with PO antibiotics until further FU imaging with MRI can be attained to rule out septic component. Her PCP has assessed serum uric acid this week and she is currently awaiting results.

## 2016-07-11 NOTE — Assessment & Plan Note (Addendum)
Uncertain as to the original source of infection with negative back imaging, negative TTE/TEE and no other potential cause. Completed 4 weeks of IV Cefazolin with last dose this morning. Her cardiac device was not extracted. Will have her come back Monday for repeat surveillance cultures as it is too early having received dose today. OK for home health to DC PICC.

## 2016-07-11 NOTE — Progress Notes (Signed)
HPI: [name] [name] is a 81 y.o. female that is here for her hospital follow up visit today. She recently underwent lumbar laminectomy by Dr. Ronnald Ramp on 05/03/16. Presented to [facility] 6/7 with fevers, weakness and diffuse pain. She developed sepsis/hypotension and was found to have MSSA bacteremia and transferred to [facility] 6/15 for continued care and assessment by EP team with her ICD/PPM. She was seen by Dr. Linus Salmons inpatient. At [facility] she underwent thorough evaluation for metastatic sites of infection including CT of spine, TTE/TEE and BCx monitoring. All repeat BCx were negative once transferred here. TTE/TEE were both negative for lead or valvular endocarditis. Was discharged home with 4 weeks IV Ancef via RUE PICC line and home health services through [facility].   Since discharge home she has been feeling very well. No chills and afebrile per her record - checks frequently. The only complaint she has today is in regards to right plantar forefoot and first MTP joint pain that started during hospitalization. When she was febrile with rigors she remarked that all her joints down to her toes were very sore and felt swollen but this one is lingering. She has been walking with a walker at home and has to offload the pressure during ambulation. Walking with limp because of this. Reports she now has swelling to the RLE through ankle and site feels warm to her. Her PCP checked uric acid recently so she is waiting to hear back about that. Pacer pocket is without tenderness, redness or dehiscence. PICC site was changed recently and is non-tender and without drainage.   Review of Systems: Review of Systems  Constitutional: Negative for chills, fever, malaise/fatigue and weight loss.  HENT: Negative for sore throat.        No dental problems  Respiratory: Negative for cough and sputum production.   Cardiovascular: Negative for chest pain and leg swelling.  Gastrointestinal: Positive for  nausea. Negative for abdominal pain, diarrhea and vomiting.  Genitourinary: Negative for dysuria and flank pain.  Musculoskeletal: Positive for joint pain (right foot pain). Negative for back pain, myalgias and neck pain.  Skin: Negative for rash.  Neurological: Negative for dizziness, tingling and headaches.  Psychiatric/Behavioral: Negative for depression and substance abuse. The patient is not nervous/anxious and does not have insomnia.    Patient's Medications  New Prescriptions   No medications on file  Previous Medications   ALBUTEROL (PROAIR HFA) 108 (90 BASE) MCG/ACT INHALER    Inhale 2 puffs into the lungs every 6 (six) hours as needed for wheezing or shortness of breath.   ASPIRIN EC 81 MG TABLET    Take 1 tablet (81 mg total) by mouth daily.   ASPIRIN-ACETAMINOPHEN-CAFFEINE (EXCEDRIN MIGRAINE) 250-250-65 MG PER TABLET    Take 1-2 tablets by mouth every 6 (six) hours as needed for headache or migraine.    AUGMENTED BETAMETHASONE DIPROPIONATE (DIPROLENE-AF) 0.05 % OINTMENT    Apply 1 application topically daily as needed (acrodermatitis [place] on fingers/toes).    CALCIUM CARBONATE ANTACID (TUMS PO)    Take 2 tablets by mouth daily as needed (indigestion).   CARVEDILOL (COREG) 12.5 MG TABLET    Take 1 tablet (12.5 mg total) by mouth 2 (two) times daily.   FUROSEMIDE (LASIX) 20 MG TABLET    Take 1 tablet (20 mg total) by mouth daily as needed for edema.   LEVOTHYROXINE (SYNTHROID, LEVOTHROID) 112 MCG TABLET    Take 112 mcg by mouth daily before breakfast.  LOSARTAN (COZAAR) 25 MG TABLET    Take 1 tablet (25 mg total) by mouth daily.   MELATONIN 5 MG TABS    Take 5 mg by mouth at bedtime as needed (for sleep.).   METHOCARBAMOL (ROBAXIN) 750 MG TABLET    Take 1 tablet (750 mg total) by mouth 2 (two) times daily as needed. For leg spasms.   METHYLPHENIDATE (RITALIN) 20 MG TABLET    Take 20 mg by mouth 2 (two) times daily as needed (narcolepsy).    NAPHAZOLINE-PHENIRAMINE (ALLERGY EYE  OP)    Place 1-2 drops into both eyes daily as needed (for irritated eyes).   ONDANSETRON (ZOFRAN) 4 MG TABLET    Take 0.5 tablets (2 mg total) by mouth every 8 (eight) hours as needed for nausea or vomiting.   ONDANSETRON (ZOFRAN-ODT) 4 MG DISINTEGRATING TABLET    Take 4 mg by mouth every 6 (six) hours as needed.   PROBIOTIC PRODUCT (PROBIOTIC PO)    Take 1 capsule by mouth at bedtime.   Modified Medications   No medications on file  Discontinued Medications   CEFAZOLIN (ANCEF) IVPB    Inject 2 g into the vein every 8 (eight) hours. Indication:  MSSA bacteremia Last Day of Therapy:  07/11/2016 Labs - Once weekly:  CBC/D and BMP, Labs - Every other week:  ESR and CRP    Past Medical History:  Diagnosis Date  . 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 AICD (automatic cardioverter/defibrillator) present   . Anemia    hx  . Angina effort ([place])   . Arthritis    "finger joints" (06/21/2016)  . Asthma   . CHF (congestive heart failure) ([place])   . 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 Daily headache    "in the last week" (615/2018)  . GERD (gastroesophageal reflux disease)   . Heart murmur   . History of blood transfusion 1990s   "related to hysterectomy"  . Hypothyroidism   . LBBB (left bundle branch block) 10/30/2013  . Left bundle branch block   . MSSA bacteremia 06/21/2016  . Myocardial infarction ([place])    "I've been told I've had some; I've never felt them" (06/21/2016)  . Pancreatitis, acute   . Pneumonia 06/2016  . Presence of permanent cardiac pacemaker   . Thyroid disease     Allergies  Allergen Reactions  . Sulfa Antibiotics Hives   OBJECTIVE: Vitals:   07/11/16 1001  BP: (!) 160/76  Pulse: 86  Temp: 97.8 F (36.6 C)  TempSrc: Oral  Weight: 156 lb (70.8 kg)   Body mass index is 32.6 kg/m.  Physical Exam  Constitutional: She is oriented to person, place, and  time and well-developed, well-nourished, and in no distress.  HENT:  Mouth/Throat: No oral lesions. No dental abscesses.  Cardiovascular: Normal rate and regular rhythm.   Murmur (Systolic ) heard. Pulmonary/Chest: Effort normal and breath sounds normal.  Abdominal: Soft. She exhibits no distension. There is no tenderness.  Musculoskeletal: Normal range of motion.       Right ankle: She exhibits swelling. She exhibits normal range of motion, no deformity and normal pulse.       Feet:  Lymphadenopathy:    She has no cervical adenopathy.  Neurological: She is alert and oriented to person, place, and time.  Skin: Skin is warm and dry. No rash noted.  Psychiatric: Mood, affect and  judgment normal.  RUE PICC: site appears clean and dry - no drainage, erythema, swelling or cording noted upon palpation. Brisk blood return during lab draws. I did not hep-lock site after blood draw as it will be D/C'd.   Microbiology: BCx 6/7 >> MSSA [place]) BCx 6/15 >> NG 2/2  ASSESSMENT & PLAN:   Problem List Items Addressed This Visit      Other   S/P lumbar fusion    Incision well healed and approximated. Without erythema, drainage, induration or tenderness. Back pain significantly improved per Ms. Kovalenko.       Right foot pain    Relatively new since admission for sepsis/bacteremia. She has documented osteoporosis on recent DEXA scan. It is possible she sustained stress fracture at some point during/prior to hospitalization (she does not remember much leading into admission and prior to transfer to [facility]). Other differentials include: septic arthritis (although she has been treated with appropriate course of therapy with 4 weeks Ancef), Gout and osteoarthritis. Will check Foot XRays, Sed Rate / CRP and CBC today. If x-rays unrevealing and inflammatory markers elevated will bridge with PO antibiotics until further FU imaging with MRI can be attained to rule out septic component. Her PCP has assessed serum  uric acid this week and she is currently awaiting results.       Relevant Orders   DG Foot Complete Right (Completed)   C-reactive protein   Sed Rate (ESR)   MSSA bacteremia - Primary    Uncertain as to the original source of infection with negative back imaging, negative TTE/TEE and no other potential cause. Completed 4 weeks of IV Cefazolin with last dose this morning. Her cardiac device was not extracted. Will have her come back Monday for repeat surveillance cultures as it is too early having received dose today. OK for home health to DC PICC.       Relevant Orders   Culture, blood (single)   Culture, blood (single)   CBC   RESOLVED: Daily headache   Biventricular ICD (implantable cardioverter-defibrillator) in place      Janene Madeira, MSN, NP-C [facility] for [facility] [place] Pager: [phone]   07/11/16 1:16 PM

## 2016-07-11 NOTE — Patient Instructions (Addendum)
Please come back on Monday 7/9 for lab work to make sure your infection is cleared  We will get x-rays on your foot today - please call if you have worsening pain or fevers/chills develop  OK to get IV line out today

## 2016-07-11 NOTE — Assessment & Plan Note (Signed)
Incision well healed and approximated. Without erythema, drainage, induration or tenderness. Back pain significantly improved per Ms. Marcello.

## 2016-07-12 ENCOUNTER — Telehealth: Payer: Self-pay | Admitting: Cardiology

## 2016-07-12 ENCOUNTER — Telehealth: Payer: Self-pay | Admitting: *Deleted

## 2016-07-12 DIAGNOSIS — Z95810 Presence of automatic (implantable) cardiac defibrillator: Secondary | ICD-10-CM

## 2016-07-12 DIAGNOSIS — J209 Acute bronchitis, unspecified: Secondary | ICD-10-CM

## 2016-07-12 DIAGNOSIS — I5022 Chronic systolic (congestive) heart failure: Secondary | ICD-10-CM

## 2016-07-12 DIAGNOSIS — I428 Other cardiomyopathies: Secondary | ICD-10-CM

## 2016-07-12 DIAGNOSIS — Z79899 Other long term (current) drug therapy: Secondary | ICD-10-CM

## 2016-07-12 DIAGNOSIS — I10 Essential (primary) hypertension: Secondary | ICD-10-CM

## 2016-07-12 LAB — SEDIMENTATION RATE: SED RATE: 69 mm/h — AB (ref 0–30)

## 2016-07-12 LAB — C-REACTIVE PROTEIN: CRP: 23.2 mg/L — AB (ref <8.0)

## 2016-07-12 NOTE — Telephone Encounter (Signed)
Per Judeth Cornfield called patient to find out who her Home Health agency is. The patient advised [facility] [phone].  Called [facility] and advised of fax number and that they need require written orders.  Valinda Hoar [phone]  Faxed written order to D/C PICC 07/12/16.

## 2016-07-12 NOTE — Telephone Encounter (Signed)
New message      Pt c/o BP issue: STAT if pt c/o blurred vision, one-sided weakness or slurred speech  1. What are your last 5 BP readings? 170/76 2. Are you having any other symptoms (ex. Dizziness, headache, blurred vision, passed out)? no 3. What is your BP issue? Pt took extra losartan 25mg  yesterday and today and bp is still elevated.  Please home health nurse or Ms Reffner at [phone]

## 2016-07-12 NOTE — Telephone Encounter (Signed)
[name] Pina RN and pts Home Health Care Nurse is calling to inform Dr [name] [name] that since the pt has been discharged from the hospital, her BP has been a little more elevated than usual.   Pt has taken an extra Losartan at times when her BP is elevated.  Per [name] [name], pt states Dr [name] [name] advised her to do so, if BP increases.  Per [name] Pina RN, pts BP at her appt with Infectious Disease yesterday, was 160/76 HR- 86- Temp- 97.8 Pt is battling multiple infections and is being closely followed by her PCP and Infectious Disease.  Pts PICC line was d/c'ed today, per Infectious Disease Provider [name] Madeira NP.  Please see Assessment and Plan below,  on the pt, by [name] Maryland NP in Infectious Disease, from yesterday:  ASSESSMENT & PLAN:      Problem List Items Addressed This Visit          Other   S/P lumbar fusion    Incision well healed and approximated. Without erythema, drainage, induration or tenderness. Back pain significantly improved per [name] [name].       Right foot pain    Relatively new since admission for sepsis/bacteremia. She has documented osteoporosis on recent DEXA scan. It is possible she sustained stress fracture at some point during/prior to hospitalization (she does not remember much leading into admission and prior to transfer to [facility]). Other differentials include: septic arthritis (although she has been treated with appropriate course of therapy with 4 weeks Ancef), Gout and osteoarthritis. Will check Foot XRays, Sed Rate / CRP and CBC today. If x-rays unrevealing and inflammatory markers elevated will bridge with PO antibiotics until further FU imaging with MRI can be attained to rule out septic component. Her PCP has assessed serum uric acid this week and she is currently awaiting results.       Relevant Orders   DG Foot Complete Right (Completed)   C-reactive protein   Sed Rate (ESR)   MSSA bacteremia - Primary    Uncertain as to the original source of infection with  negative back imaging, negative TTE/TEE and no other potential cause. Completed 4 weeks of IV Cefazolin with last dose this morning. Her cardiac device was not extracted. Will have her come back Monday for repeat surveillance cultures as it is too early having received dose today. OK for home health to DC PICC.       Relevant Orders   Culture, blood (single)   Culture, blood (single)   CBC   RESOLVED: Daily headache   Biventricular ICD (implantable cardioverter-defibrillator) in place    [name] Madeira, MSN, NP-C [facility] [place] Pager: [phone]  07/11/16 1:16 PM   Per [name] Pina RN with the pt, the pt will be going into see Infectious Disease on next Monday and she will be calling her PCP then as well, to obtain a follow-up appt for ongoing HTN. Per [name] [name], the pt is asymptomatic with her elevated numbers.  Per [name] [name], she will be in contact with the pt all weekend.  Informed [name] [name] that Dr [name] [name] is out of the office for the next week, but I will route this message to her for further review and recommendation if needed.  Informed [name] [name] that someone from the office will follow-up with her or the pt, if Dr [name] [name] has further recommendations.  [name] [name] verbalized understanding and agrees with this plan.

## 2016-07-15 ENCOUNTER — Other Ambulatory Visit: Payer: Medicare Other

## 2016-07-15 DIAGNOSIS — R7881 Bacteremia: Principal | ICD-10-CM

## 2016-07-15 NOTE — Telephone Encounter (Signed)
Please increase Losartan to 50 mg po daily, please send my greetings to her and her husband! Tobias Alexander

## 2016-07-16 MED ORDER — LOSARTAN POTASSIUM 50 MG PO TABS: 50.0000 mg | ORAL_TABLET | Freq: Every day | ORAL | 11 refills | Status: DC

## 2016-07-16 NOTE — Telephone Encounter (Signed)
Patient made aware of Dr. Lindaann Slough recommendations to increase losartan to 50 mg daily. Rx sent to patient's preferred pharmacy. Patient advised to continue to monitor BP. Patient verbalized understanding and thanked me for the call.

## 2016-07-21 LAB — CULTURE, BLOOD (SINGLE)
ORGANISM ID, BACTERIA: NO GROWTH
Organism ID, Bacteria: NO GROWTH

## 2016-07-23 ENCOUNTER — Telehealth: Payer: Self-pay | Admitting: *Deleted

## 2016-07-23 NOTE — Telephone Encounter (Signed)
Call from [facility] to advise that patient missed her physical therapy appointment for 07/19/16. Wendall Mola

## 2016-07-24 ENCOUNTER — Encounter: Payer: Self-pay | Admitting: Internal Medicine

## 2016-07-24 ENCOUNTER — Ambulatory Visit (INDEPENDENT_AMBULATORY_CARE_PROVIDER_SITE_OTHER): Payer: Medicare Other | Admitting: Internal Medicine

## 2016-07-24 VITALS — BP 142/78 | HR 79 | Ht <= 58 in | Wt 150.0 lb

## 2016-07-24 DIAGNOSIS — R7881 Bacteremia: Secondary | ICD-10-CM

## 2016-07-24 DIAGNOSIS — Z95810 Presence of automatic (implantable) cardiac defibrillator: Secondary | ICD-10-CM | POA: Diagnosis not present

## 2016-07-24 DIAGNOSIS — I5022 Chronic systolic (congestive) heart failure: Secondary | ICD-10-CM

## 2016-07-24 DIAGNOSIS — I447 Left bundle-branch block, unspecified: Secondary | ICD-10-CM | POA: Diagnosis not present

## 2016-07-24 DIAGNOSIS — B9561 Methicillin susceptible Staphylococcus aureus infection as the cause of diseases classified elsewhere: Secondary | ICD-10-CM

## 2016-07-24 DIAGNOSIS — I428 Other cardiomyopathies: Secondary | ICD-10-CM

## 2016-07-24 LAB — CUP PACEART INCLINIC DEVICE CHECK
Brady Statistic RA Percent Paced: 0 %
Brady Statistic RV Percent Paced: 98 %
Date Time Interrogation Session: 20180718112837
HighPow Impedance: 75 Ohm
Implantable Lead Implant Date: 20160627
Implantable Lead Location: 753858
Implantable Lead Location: 753859
Implantable Lead Location: 753860
Implantable Lead Model: 386835
Implantable Lead Model: 5076
Lead Channel Impedance Value: 623 Ohm
Lead Channel Pacing Threshold Amplitude: 0.4 V
Lead Channel Setting Pacing Amplitude: 2.5 V
Lead Channel Setting Pacing Pulse Width: 0.4 ms
Lead Channel Setting Pacing Pulse Width: 1 ms
MDC IDC LEAD IMPLANT DT: 20160627
MDC IDC LEAD IMPLANT DT: 20160627
MDC IDC LEAD SERIAL: 49219603
MDC IDC LEAD SERIAL: 49263525
MDC IDC MSMT BATTERY VOLTAGE: 2.92 V
MDC IDC MSMT LEADCHNL LV PACING THRESHOLD AMPLITUDE: 1.8 V
MDC IDC MSMT LEADCHNL LV PACING THRESHOLD PULSEWIDTH: 1 ms
MDC IDC MSMT LEADCHNL LV SENSING INTR AMPL: 18 mV
MDC IDC MSMT LEADCHNL RA IMPEDANCE VALUE: 536 Ohm
MDC IDC MSMT LEADCHNL RA PACING THRESHOLD AMPLITUDE: 0.5 V
MDC IDC MSMT LEADCHNL RA PACING THRESHOLD PULSEWIDTH: 0.4 ms
MDC IDC MSMT LEADCHNL RA SENSING INTR AMPL: 1.3 mV
MDC IDC MSMT LEADCHNL RV IMPEDANCE VALUE: 637 Ohm
MDC IDC MSMT LEADCHNL RV PACING THRESHOLD PULSEWIDTH: 0.4 ms
MDC IDC MSMT LEADCHNL RV SENSING INTR AMPL: 21.1 mV
MDC IDC PG IMPLANT DT: 20160627
MDC IDC SET LEADCHNL LV SENSING SENSITIVITY: 1.6 mV
MDC IDC SET LEADCHNL RA PACING AMPLITUDE: 2 V
MDC IDC SET LEADCHNL RV PACING AMPLITUDE: 2 V
MDC IDC SET LEADCHNL RV SENSING SENSITIVITY: 0.8 mV
Pulse Gen Model: 383547
Pulse Gen Serial Number: 60863705

## 2016-07-24 NOTE — Patient Instructions (Addendum)
Medication Instructions: - Your physician has recommended you make the following change in your medication:  1) hold aspirin until you are seen by Dr. Delton See.  Labwork: - Your physician recommends that you have lab work today:  Blood cultures x 2   Procedures/Testing: - none ordered  Follow-Up: - Your physician recommends that you schedule a follow-up appointment in: 6-8 weeks with Dr. Delton See.   - Remote monitoring is used to monitor your Pacemaker of ICD from home. This monitoring reduces the number of office visits required to check your device to one time per year. It allows [place] to keep an eye on the functioning of your device to ensure it is working properly. You are scheduled for a device check from home on 08/27/16. You may send your transmission at any time that day. If you have a wireless device, the transmission will be sent automatically. After your physician reviews your transmission, you will receive a postcard with your next transmission date.  - Your physician wants you to follow-up in: 1 year with Dr. Graciela Husbands. You will receive a reminder letter in the mail two months in advance. If you don't receive a letter, please call our office to schedule the follow-up appointment.   Any Additional Special Instructions Will Be Listed Below (If Applicable).     If you need a refill on your cardiac medications before your next appointment, please call your pharmacy.

## 2016-07-24 NOTE — Progress Notes (Signed)
Patient Care Team: [name], MD as PCP - General (Family Medicine)   HPI  [name] [name] is a 81 y.o. female Seen in follow-up for nonischemic cardiomyopathy left bundle branch block and nonsustained ventricular tachycardia.   She underwent CRT Biotronik implantation  6/16. Initially she was significantly better.    She continues to feel quite a deep good deal better. Her breathlessness is still somewhat limiting but she is able to walk more than 100 feet. She had back surgery and she went from limited by her back to limited by her breathing. She's had some peripheral edema which is asymmetric or from the right. This has been present for about 3 months.   She has not noted any palpitations she's had no syncope  6/18 she was hospitalized and found to have staph bacteremia of (MSSA) in the wake of orthopedic surgery. and underwent TEE at [facility] which was reportedly negative. She was treated with a PICC line and anticipated 6 weeks of antibiotics   The ABx ended a week ago and cultures were neg at that time  No complaints of fevers and chills, breathing is better and no edema   She had complaints of "septic arthritis "involving her ankle, her knee and now her hip. These developed while she was on antibiotics and resolved spontaneously. There was no aspiration. It sounds like a serum uric acid was obtained and was  normal   DATE TEST    6/16    echo   EF 25 %   1/17    echo   EF 55-60 %           7/18  Hgb 9.9  Ferritin 106  Saturation 23    Past Medical History:  Diagnosis Date  . 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 AICD (automatic cardioverter/defibrillator) present   . Anemia    hx  . Angina effort (HCC)   . Arthritis    "finger joints" (06/21/2016)  . Asthma   . CHF (congestive heart failure) (HCC)   . Congestive dilated cardiomyopathy (HCC) 03/31/2014    . Daily headache    "in the last week" (615/2018)  . GERD (gastroesophageal reflux disease)   . Heart murmur   . History of blood transfusion 1990s   "related to hysterectomy"  . Hypothyroidism   . LBBB (left bundle branch block) 10/30/2013  . Left bundle branch block   . MSSA bacteremia 06/21/2016  . Myocardial infarction (HCC)    "I've been told I've had some; I've never felt them" (06/21/2016)  . Pancreatitis, acute   . Pneumonia 06/2016  . Presence of permanent cardiac pacemaker   . Thyroid disease     Past Surgical History:  Procedure Laterality Date  . ABDOMINAL HYSTERECTOMY    . APPENDECTOMY    . CARDIAC CATHETERIZATION    . CARDIAC DEFIBRILLATOR PLACEMENT  ?2016  . CATARACT EXTRACTION W/ INTRAOCULAR LENS  IMPLANT, BILATERAL Bilateral   . CHOLECYSTECTOMY OPEN     "related to pancreatitis"  . DILATION AND CURETTAGE OF UTERUS    . EP IMPLANTABLE DEVICE N/A 07/04/2014   Procedure: BiV ICD Insertion CRT-D;  Surgeon: Duke Salvia, MD;  Location: [facility] INVASIVE CV LAB;  Service: Cardiovascular;  Laterality: N/A;  . HERNIA REPAIR    . INSERT / REPLACE / REMOVE PACEMAKER  ?2016  . JOINT  REPLACEMENT Bilateral    "thumb joints; used my own material"  . JOINT REPLACEMENT    . LEFT HEART CATHETERIZATION WITH CORONARY ANGIOGRAM N/A 11/02/2013   Procedure: LEFT HEART CATHETERIZATION WITH CORONARY ANGIOGRAM;  Surgeon: Corky Crafts, MD;  Location: [facility] CATH LAB;  Service: Cardiovascular;  Laterality: N/A;  . LOOP RECORDER IMPLANT N/A 04/13/2014   Procedure: LOOP RECORDER IMPLANT;  Surgeon: Duke Salvia, MD;  Location: [facility] CATH LAB;  Service: Cardiovascular;  Laterality: N/A;  . LOOP RECORDER REMOVAL  ?2016  . POSTERIOR LUMBAR FUSION  05/03/2016   Lumbar Two-Three, Lumbar Three-Four Posterior Lumbar Fusion withLaminectomy and Foraminotomy  . SPINE SURGERY  04/2016   lumbar   . TONSILLECTOMY    . TOTAL HIP ARTHROPLASTY Bilateral   . UMBILICAL HERNIA REPAIR  1970s?    Current  Outpatient Prescriptions  Medication Sig Dispense Refill  . albuterol (PROAIR HFA) 108 (90 Base) MCG/ACT inhaler Inhale 2 puffs into the lungs every 6 (six) hours as needed for wheezing or shortness of breath.    [name] aspirin EC 81 MG tablet Take 81 mg by mouth daily.    [name] aspirin-acetaminophen-caffeine (EXCEDRIN MIGRAINE) 250-250-65 MG per tablet Take 1-2 tablets by mouth every 6 (six) hours as needed for headache or migraine.     [name] augmented betamethasone dipropionate (DIPROLENE-AF) 0.05 % ointment Apply 1 application topically daily as needed (acrodermatitis [place] on fingers/toes).     . Calcium Carbonate Antacid (TUMS PO) Take 2 tablets by mouth daily as needed (indigestion).    . carvedilol (COREG) 12.5 MG tablet Take 1 tablet (12.5 mg total) by mouth 2 (two) times daily. 180 tablet 3  . furosemide (LASIX) 20 MG tablet Take 1 tablet (20 mg total) by mouth daily as needed for edema. (Patient taking differently: Take 20 mg by mouth daily as needed for fluid or edema. ) 90 tablet 3  . levothyroxine (SYNTHROID, LEVOTHROID) 112 MCG tablet Take 112 mcg by mouth daily before breakfast.    . losartan (COZAAR) 50 MG tablet Take 1 tablet (50 mg total) by mouth daily. 30 tablet 11  . Melatonin 5 MG TABS Take 5 mg by mouth at bedtime as needed (for sleep.).    [name] methocarbamol (ROBAXIN) 750 MG tablet Take 1 tablet (750 mg total) by mouth 2 (two) times daily as needed. For leg spasms. (Patient taking differently: Take 750 mg by mouth 2 (two) times daily as needed for muscle spasms (leg spasms). ) 60 tablet 1  . methylphenidate (RITALIN) 20 MG tablet Take 20 mg by mouth 2 (two) times daily as needed (narcolepsy).     March Rummage (ALLERGY EYE OP) Place 1-2 drops into both eyes daily as needed (for irritated eyes).    . ondansetron (ZOFRAN) 4 MG tablet Take 0.5 tablets (2 mg total) by mouth every 8 (eight) hours as needed for nausea or vomiting. 20 tablet 3  . ondansetron (ZOFRAN-ODT) 4 MG  disintegrating tablet Take 4 mg by mouth every 6 (six) hours as needed.  0  . Probiotic Product (PROBIOTIC PO) Take 1 capsule by mouth at bedtime.      No current facility-administered medications for this visit.     Allergies  Allergen Reactions  . Sulfa Antibiotics Hives    Review of Systems negative except from HPI and PMH  Physical Exam BP (!) 142/78   Pulse 79   Ht 4\' 10"  (1.473 m)   Wt 150 lb (68 kg)   SpO2 98%   BMI  31.35 kg/m  Well developed and nourished in no acute distress HENT normal Neck supple with JVP-flat Carotids brisk and full without bruits Clear Pocket clear  Regular rate and rhythm, no murmurs or gallops Abd-soft with active BS without hepatomegaly No Clubbing cyanosis edema Skin-warm and dry A & Oriented  Grossly normal sensory and motor function   ECG demonstrates sinus rhythm with P-synchronous/ AV  pacing  Intervals 16/10/40 . QRS was negative in lead V1 and upright in lead 1.     Assessment and  Plan  Nonischemic cardiomyopathy w intercurrent resolution   Left bundle branch block  CRT-D  Biotronik  The patient's device was interrogated.  The information was reviewed. No changes were made in the programming.     Congestive heart failure class II  Hypertension  MSSA bacteremia  ? Source   Anemia     Well compensated from a heart failure viewpoint. No volume overload. Functional status is stable.  Somewhat surprising giving her anemia. I suspect that this emerged in the context of her bacteremia  but I think there is also no clear indication for aspirin so we'll have her hold that at this time.   I spoke with Dr. Luciana Axe regarding surveillance blood cultures in the context of MSSA bacteremia. We will obtain him today. If they are negative he suggests we follow her closely for evidence of recurrent infection  I think it is exceedingly unlikely that she developed transient septic arthritis of her ankle knee and hip on antibiotics. I  suspect is more likely gout or pseudogout. In the event that it recurs, she will need to be considered for joint aspiration

## 2016-07-30 LAB — CULTURE, BLOOD (SINGLE)

## 2016-08-11 ENCOUNTER — Emergency Department (HOSPITAL_COMMUNITY): Payer: Medicare Other | Admitting: Anesthesiology

## 2016-08-11 ENCOUNTER — Encounter (HOSPITAL_COMMUNITY)
Admission: EM | Disposition: A | Discharge status: Other Acute Inpatient Hospital | Payer: Self-pay | Source: Home / Self Care | Attending: Internal Medicine

## 2016-08-11 ENCOUNTER — Inpatient Hospital Stay (HOSPITAL_COMMUNITY)
Admission: EM | Admit: 2016-08-11 | Discharge: 2016-08-15 | DRG: 463 | Disposition: A | Discharge status: Other Acute Inpatient Hospital | Payer: Medicare Other | Attending: Internal Medicine | Admitting: Internal Medicine

## 2016-08-11 ENCOUNTER — Emergency Department (HOSPITAL_COMMUNITY): Payer: Medicare Other

## 2016-08-11 ENCOUNTER — Encounter (HOSPITAL_COMMUNITY): Payer: Self-pay | Admitting: *Deleted

## 2016-08-11 DIAGNOSIS — E039 Hypothyroidism, unspecified: Secondary | ICD-10-CM | POA: Diagnosis present

## 2016-08-11 DIAGNOSIS — Z66 Do not resuscitate: Secondary | ICD-10-CM | POA: Diagnosis present

## 2016-08-11 DIAGNOSIS — R1031 Right lower quadrant pain: Secondary | ICD-10-CM | POA: Diagnosis not present

## 2016-08-11 DIAGNOSIS — Z95810 Presence of automatic (implantable) cardiac defibrillator: Secondary | ICD-10-CM | POA: Diagnosis not present

## 2016-08-11 DIAGNOSIS — R7881 Bacteremia: Secondary | ICD-10-CM | POA: Diagnosis not present

## 2016-08-11 DIAGNOSIS — I420 Dilated cardiomyopathy: Secondary | ICD-10-CM | POA: Diagnosis not present

## 2016-08-11 DIAGNOSIS — I330 Acute and subacute infective endocarditis: Secondary | ICD-10-CM | POA: Diagnosis not present

## 2016-08-11 DIAGNOSIS — K219 Gastro-esophageal reflux disease without esophagitis: Secondary | ICD-10-CM | POA: Diagnosis present

## 2016-08-11 DIAGNOSIS — D638 Anemia in other chronic diseases classified elsewhere: Secondary | ICD-10-CM | POA: Diagnosis not present

## 2016-08-11 DIAGNOSIS — I2510 Atherosclerotic heart disease of native coronary artery without angina pectoris: Secondary | ICD-10-CM | POA: Diagnosis present

## 2016-08-11 DIAGNOSIS — T8484XA Pain due to internal orthopedic prosthetic devices, implants and grafts, initial encounter: Secondary | ICD-10-CM

## 2016-08-11 DIAGNOSIS — A498 Other bacterial infections of unspecified site: Secondary | ICD-10-CM | POA: Diagnosis not present

## 2016-08-11 DIAGNOSIS — Z9841 Cataract extraction status, right eye: Secondary | ICD-10-CM | POA: Diagnosis not present

## 2016-08-11 DIAGNOSIS — N179 Acute kidney failure, unspecified: Secondary | ICD-10-CM | POA: Diagnosis present

## 2016-08-11 DIAGNOSIS — E032 Hypothyroidism due to medicaments and other exogenous substances: Secondary | ICD-10-CM | POA: Diagnosis not present

## 2016-08-11 DIAGNOSIS — B9561 Methicillin susceptible Staphylococcus aureus infection as the cause of diseases classified elsewhere: Secondary | ICD-10-CM | POA: Diagnosis present

## 2016-08-11 DIAGNOSIS — M25561 Pain in right knee: Secondary | ICD-10-CM | POA: Diagnosis present

## 2016-08-11 DIAGNOSIS — J45909 Unspecified asthma, uncomplicated: Secondary | ICD-10-CM | POA: Diagnosis present

## 2016-08-11 DIAGNOSIS — M01X0 Direct infection of unspecified joint in infectious and parasitic diseases classified elsewhere: Secondary | ICD-10-CM | POA: Diagnosis not present

## 2016-08-11 DIAGNOSIS — Z96643 Presence of artificial hip joint, bilateral: Secondary | ICD-10-CM | POA: Diagnosis present

## 2016-08-11 DIAGNOSIS — Z961 Presence of intraocular lens: Secondary | ICD-10-CM | POA: Diagnosis present

## 2016-08-11 DIAGNOSIS — M00069 Staphylococcal arthritis, unspecified knee: Secondary | ICD-10-CM | POA: Diagnosis not present

## 2016-08-11 DIAGNOSIS — L402 Acrodermatitis continua: Secondary | ICD-10-CM | POA: Diagnosis present

## 2016-08-11 DIAGNOSIS — I252 Old myocardial infarction: Secondary | ICD-10-CM | POA: Diagnosis not present

## 2016-08-11 DIAGNOSIS — M009 Pyogenic arthritis, unspecified: Secondary | ICD-10-CM

## 2016-08-11 DIAGNOSIS — Z7982 Long term (current) use of aspirin: Secondary | ICD-10-CM

## 2016-08-11 DIAGNOSIS — Z981 Arthrodesis status: Secondary | ICD-10-CM

## 2016-08-11 DIAGNOSIS — T8453XD Infection and inflammatory reaction due to internal right knee prosthesis, subsequent encounter: Secondary | ICD-10-CM | POA: Diagnosis not present

## 2016-08-11 DIAGNOSIS — T8450XA Infection and inflammatory reaction due to unspecified internal joint prosthesis, initial encounter: Secondary | ICD-10-CM

## 2016-08-11 DIAGNOSIS — I428 Other cardiomyopathies: Secondary | ICD-10-CM

## 2016-08-11 DIAGNOSIS — D62 Acute posthemorrhagic anemia: Secondary | ICD-10-CM | POA: Diagnosis not present

## 2016-08-11 DIAGNOSIS — Z9842 Cataract extraction status, left eye: Secondary | ICD-10-CM | POA: Diagnosis not present

## 2016-08-11 DIAGNOSIS — T827XXA Infection and inflammatory reaction due to other cardiac and vascular devices, implants and grafts, initial encounter: Secondary | ICD-10-CM | POA: Diagnosis present

## 2016-08-11 DIAGNOSIS — Y831 Surgical operation with implant of artificial internal device as the cause of abnormal reaction of the patient, or of later complication, without mention of misadventure at the time of the procedure: Secondary | ICD-10-CM | POA: Diagnosis present

## 2016-08-11 DIAGNOSIS — M1990 Unspecified osteoarthritis, unspecified site: Secondary | ICD-10-CM | POA: Diagnosis present

## 2016-08-11 DIAGNOSIS — M00061 Staphylococcal arthritis, right knee: Secondary | ICD-10-CM | POA: Diagnosis not present

## 2016-08-11 DIAGNOSIS — Z96652 Presence of left artificial knee joint: Secondary | ICD-10-CM | POA: Diagnosis present

## 2016-08-11 DIAGNOSIS — T827XXD Infection and inflammatory reaction due to other cardiac and vascular devices, implants and grafts, subsequent encounter: Secondary | ICD-10-CM | POA: Diagnosis not present

## 2016-08-11 DIAGNOSIS — Z79899 Other long term (current) drug therapy: Secondary | ICD-10-CM | POA: Diagnosis not present

## 2016-08-11 DIAGNOSIS — Y838 Other surgical procedures as the cause of abnormal reaction of the patient, or of later complication, without mention of misadventure at the time of the procedure: Secondary | ICD-10-CM | POA: Diagnosis present

## 2016-08-11 DIAGNOSIS — Z09 Encounter for follow-up examination after completed treatment for conditions other than malignant neoplasm: Secondary | ICD-10-CM

## 2016-08-11 DIAGNOSIS — T8484XD Pain due to internal orthopedic prosthetic devices, implants and grafts, subsequent encounter: Secondary | ICD-10-CM | POA: Diagnosis not present

## 2016-08-11 DIAGNOSIS — M0009 Staphylococcal polyarthritis: Secondary | ICD-10-CM | POA: Diagnosis not present

## 2016-08-11 DIAGNOSIS — M00071 Staphylococcal arthritis, right ankle and foot: Secondary | ICD-10-CM | POA: Diagnosis not present

## 2016-08-11 DIAGNOSIS — T8453XA Infection and inflammatory reaction due to internal right knee prosthesis, initial encounter: Principal | ICD-10-CM | POA: Diagnosis present

## 2016-08-11 DIAGNOSIS — I5022 Chronic systolic (congestive) heart failure: Secondary | ICD-10-CM | POA: Diagnosis present

## 2016-08-11 DIAGNOSIS — I447 Left bundle-branch block, unspecified: Secondary | ICD-10-CM | POA: Diagnosis present

## 2016-08-11 DIAGNOSIS — Z96651 Presence of right artificial knee joint: Secondary | ICD-10-CM | POA: Diagnosis not present

## 2016-08-11 DIAGNOSIS — A419 Sepsis, unspecified organism: Secondary | ICD-10-CM | POA: Diagnosis not present

## 2016-08-11 DIAGNOSIS — M01X9 Direct infection of multiple joints in infectious and parasitic diseases classified elsewhere: Secondary | ICD-10-CM | POA: Diagnosis not present

## 2016-08-11 DIAGNOSIS — I351 Nonrheumatic aortic (valve) insufficiency: Secondary | ICD-10-CM | POA: Diagnosis not present

## 2016-08-11 HISTORY — PX: I & D KNEE WITH POLY EXCHANGE: SHX5024

## 2016-08-11 LAB — I-STAT CG4 LACTIC ACID, ED
LACTIC ACID, VENOUS: 1.19 mmol/L (ref 0.5–1.9)
LACTIC ACID, VENOUS: 1.01 mmol/L (ref 0.5–1.9)

## 2016-08-11 LAB — COMPREHENSIVE METABOLIC PANEL
ALK PHOS: 73 U/L (ref 38–126)
ALT: 8 U/L — AB (ref 14–54)
AST: 16 U/L (ref 15–41)
Albumin: 3.3 g/dL — ABNORMAL LOW (ref 3.5–5.0)
Anion gap: 10 (ref 5–15)
BILIRUBIN TOTAL: 0.7 mg/dL (ref 0.3–1.2)
BUN: 17 mg/dL (ref 6–20)
CALCIUM: 9.3 mg/dL (ref 8.9–10.3)
CO2: 24 mmol/L (ref 22–32)
CREATININE: 1.01 mg/dL — AB (ref 0.44–1.00)
Chloride: 103 mmol/L (ref 101–111)
GFR calc Af Amer: >60 mL/min (ref >60)
GFR, EST NON AFRICAN AMERICAN: 52 mL/min — AB (ref >60)
Glucose, Bld: 121 mg/dL — ABNORMAL HIGH (ref 65–99)
Potassium: 4.2 mmol/L (ref 3.5–5.1)
Sodium: 137 mmol/L (ref 135–145)
Total Protein: 7.7 g/dL (ref 6.5–8.1)

## 2016-08-11 LAB — URINALYSIS, ROUTINE W REFLEX MICROSCOPIC
Bilirubin Urine: NEGATIVE
GLUCOSE, UA: NEGATIVE mg/dL
Ketones, ur: NEGATIVE mg/dL
Leukocytes, UA: NEGATIVE
Nitrite: NEGATIVE
Protein, ur: 100 mg/dL — AB
SPECIFIC GRAVITY, URINE: 1.025 (ref 1.005–1.030)
pH: 5.5 (ref 5.0–8.0)

## 2016-08-11 LAB — URINALYSIS, MICROSCOPIC (REFLEX)

## 2016-08-11 LAB — CBC WITH DIFFERENTIAL/PLATELET
BASOS ABS: 0 10*3/uL (ref 0.0–0.1)
Basophils Relative: 1 %
EOS ABS: 0.1 10*3/uL (ref 0.0–0.7)
EOS PCT: 2 %
HCT: 34.7 % — ABNORMAL LOW (ref 36.0–46.0)
Hemoglobin: 11.3 g/dL — ABNORMAL LOW (ref 12.0–15.0)
LYMPHS ABS: 0.8 10*3/uL (ref 0.7–4.0)
Lymphocytes Relative: 9 %
MCH: 30.3 pg (ref 26.0–34.0)
MCHC: 32.6 g/dL (ref 30.0–36.0)
MCV: 93 fL (ref 78.0–100.0)
Monocytes Absolute: 0.6 10*3/uL (ref 0.1–1.0)
Monocytes Relative: 7 %
Neutro Abs: 6.5 10*3/uL (ref 1.7–7.7)
Neutrophils Relative %: 81 %
PLATELETS: 165 10*3/uL (ref 150–400)
RBC: 3.73 MIL/uL — ABNORMAL LOW (ref 3.87–5.11)
RDW: 14.4 % (ref 11.5–15.5)
WBC: 8 10*3/uL (ref 4.0–10.5)

## 2016-08-11 LAB — SYNOVIAL CELL COUNT + DIFF, W/ CRYSTALS
CRYSTALS FLUID: NONE SEEN
Eosinophils-Synovial: 0 % (ref 0–1)
Lymphocytes-Synovial Fld: 8 % (ref 0–20)
Monocyte-Macrophage-Synovial Fluid: 12 % — ABNORMAL LOW (ref 50–90)
Neutrophil, Synovial: 80 % — ABNORMAL HIGH (ref 0–25)
WBC, SYNOVIAL: 23310 /mm3 — AB (ref 0–200)

## 2016-08-11 LAB — PROTIME-INR
INR: 1
PROTHROMBIN TIME: 13.2 s (ref 11.4–15.2)

## 2016-08-11 LAB — SEDIMENTATION RATE: Sed Rate: 74 mm/hr — ABNORMAL HIGH (ref 0–22)

## 2016-08-11 LAB — GRAM STAIN

## 2016-08-11 LAB — C-REACTIVE PROTEIN: CRP: 19.8 mg/dL — AB (ref <1.0)

## 2016-08-11 SURGERY — IRRIGATION AND DEBRIDEMENT KNEE WITH POLY EXCHANGE
Anesthesia: General | Site: Leg Upper | Laterality: Right

## 2016-08-11 MED ORDER — ALBUTEROL SULFATE HFA 108 (90 BASE) MCG/ACT IN AERS
INHALATION_SPRAY | RESPIRATORY_TRACT | Status: DC | PRN
  Administered 2016-08-11: 4 via RESPIRATORY_TRACT

## 2016-08-11 MED ORDER — FENTANYL CITRATE (PF) 250 MCG/5ML IJ SOLN
INTRAMUSCULAR | Status: AC
  Filled 2016-08-11: qty 5

## 2016-08-11 MED ORDER — METHOCARBAMOL 500 MG PO TABS
500.0000 mg | ORAL_TABLET | Freq: Four times a day (QID) | ORAL | Status: DC | PRN
  Administered 2016-08-11 – 2016-08-13 (×5): 500 mg via ORAL
  Filled 2016-08-11 (×5): qty 1

## 2016-08-11 MED ORDER — LIDOCAINE HCL (CARDIAC) 20 MG/ML IV SOLN
INTRAVENOUS | Status: DC | PRN
  Administered 2016-08-11: 80 mg via INTRAVENOUS

## 2016-08-11 MED ORDER — PROPOFOL 10 MG/ML IV BOLUS
INTRAVENOUS | Status: AC
  Filled 2016-08-11: qty 20

## 2016-08-11 MED ORDER — CEFAZOLIN SODIUM-DEXTROSE 2-3 GM-% IV SOLR
INTRAVENOUS | Status: DC | PRN
  Administered 2016-08-11: 2 g via INTRAVENOUS

## 2016-08-11 MED ORDER — ARTIFICIAL TEARS OPHTHALMIC OINT
TOPICAL_OINTMENT | OPHTHALMIC | Status: DC | PRN
  Administered 2016-08-11: 1 via OPHTHALMIC

## 2016-08-11 MED ORDER — POVIDONE-IODINE 10 % EX SWAB: 2.0000 "application " | Freq: Once | CUTANEOUS | Status: DC

## 2016-08-11 MED ORDER — ENOXAPARIN SODIUM 40 MG/0.4ML ~~LOC~~ SOLN
40.0000 mg | SUBCUTANEOUS | Status: DC
Start: 2016-08-11 — End: 2016-08-11

## 2016-08-11 MED ORDER — SENNA 8.6 MG PO TABS
2.0000 | ORAL_TABLET | Freq: Every day | ORAL | Status: DC
  Administered 2016-08-11 – 2016-08-14 (×4): 17.2 mg via ORAL
  Filled 2016-08-11 (×4): qty 2

## 2016-08-11 MED ORDER — BUPIVACAINE HCL (PF) 0.5 % IJ SOLN
INTRAMUSCULAR | Status: DC | PRN
  Administered 2016-08-11: 60 mL

## 2016-08-11 MED ORDER — SODIUM CHLORIDE 0.9 % IV SOLN
INTRAVENOUS | Status: DC
  Administered 2016-08-12: via INTRAVENOUS

## 2016-08-11 MED ORDER — ONDANSETRON HCL 4 MG PO TABS: 4.0000 mg | ORAL_TABLET | Freq: Four times a day (QID) | ORAL | Status: DC | PRN

## 2016-08-11 MED ORDER — ONDANSETRON HCL 4 MG/2ML IJ SOLN: 4.0000 mg | Freq: Once | INTRAMUSCULAR | Status: DC

## 2016-08-11 MED ORDER — TRANEXAMIC ACID 1000 MG/10ML IV SOLN
1000.0000 mg | INTRAVENOUS | Status: AC
  Administered 2016-08-11: 1000 mg via INTRAVENOUS
  Filled 2016-08-11 (×2): qty 10

## 2016-08-11 MED ORDER — BUPIVACAINE HCL (PF) 0.5 % IJ SOLN
INTRAMUSCULAR | Status: AC
  Filled 2016-08-11: qty 30

## 2016-08-11 MED ORDER — LEVOTHYROXINE SODIUM 112 MCG PO TABS
112.0000 ug | ORAL_TABLET | Freq: Every day | ORAL | Status: DC
  Administered 2016-08-12 – 2016-08-15 (×4): 112 ug via ORAL
  Filled 2016-08-11 (×4): qty 1

## 2016-08-11 MED ORDER — APIXABAN 2.5 MG PO TABS: 2.5000 mg | ORAL_TABLET | Freq: Two times a day (BID) | ORAL | Status: DC

## 2016-08-11 MED ORDER — METOCLOPRAMIDE HCL 5 MG/ML IJ SOLN: 5.0000 mg | Freq: Three times a day (TID) | INTRAMUSCULAR | Status: DC | PRN

## 2016-08-11 MED ORDER — SUCCINYLCHOLINE CHLORIDE 20 MG/ML IJ SOLN
INTRAMUSCULAR | Status: DC | PRN
Start: 2016-08-11 — End: 2016-08-11
  Administered 2016-08-11: 100 mg via INTRAVENOUS

## 2016-08-11 MED ORDER — HYDROCODONE-ACETAMINOPHEN 5-325 MG PO TABS
1.0000 | ORAL_TABLET | ORAL | Status: DC | PRN
  Administered 2016-08-12 (×2): 1 via ORAL
  Administered 2016-08-13: 2 via ORAL
  Filled 2016-08-11: qty 1
  Filled 2016-08-11: qty 2
  Filled 2016-08-11: qty 1

## 2016-08-11 MED ORDER — CHLORHEXIDINE GLUCONATE 4 % EX LIQD: 60.0000 mL | Freq: Once | CUTANEOUS | Status: DC

## 2016-08-11 MED ORDER — KETOROLAC TROMETHAMINE 30 MG/ML IJ SOLN
INTRAMUSCULAR | Status: DC | PRN
  Administered 2016-08-11: 30 mg via INTRAVENOUS

## 2016-08-11 MED ORDER — ACETAMINOPHEN 10 MG/ML IV SOLN: 1000.0000 mg | INTRAVENOUS | Status: DC

## 2016-08-11 MED ORDER — FUROSEMIDE 20 MG PO TABS: 20.0000 mg | ORAL_TABLET | Freq: Every day | ORAL | Status: DC | PRN

## 2016-08-11 MED ORDER — METHOCARBAMOL 500 MG PO TABS
ORAL_TABLET | ORAL | Status: AC
  Administered 2016-08-11: 500 mg via ORAL
  Filled 2016-08-11: qty 1

## 2016-08-11 MED ORDER — CEFAZOLIN SODIUM-DEXTROSE 2-4 GM/100ML-% IV SOLN
INTRAVENOUS | Status: AC
  Filled 2016-08-11: qty 100

## 2016-08-11 MED ORDER — NAPHAZOLINE-PHENIRAMINE 0.025-0.3 % OP SOLN: 1.0000 [drp] | Freq: Every day | OPHTHALMIC | Status: DC | PRN

## 2016-08-11 MED ORDER — FENTANYL CITRATE (PF) 100 MCG/2ML IJ SOLN
25.0000 ug | INTRAMUSCULAR | Status: DC | PRN
  Administered 2016-08-11: 50 ug via INTRAVENOUS
  Administered 2016-08-11 (×2): 25 ug via INTRAVENOUS

## 2016-08-11 MED ORDER — ARTIFICIAL TEARS OPHTHALMIC OINT
TOPICAL_OINTMENT | OPHTHALMIC | Status: AC
  Filled 2016-08-11: qty 3.5

## 2016-08-11 MED ORDER — LACTATED RINGERS IV SOLN
INTRAVENOUS | Status: DC | PRN
  Administered 2016-08-11: 15:00:00 via INTRAVENOUS

## 2016-08-11 MED ORDER — LIDOCAINE HCL (PF) 1 % IJ SOLN
5.0000 mL | Freq: Once | INTRAMUSCULAR | Status: DC
  Administered 2016-08-11: 5 mL via INTRADERMAL
  Filled 2016-08-11: qty 5

## 2016-08-11 MED ORDER — ONDANSETRON HCL 4 MG/2ML IJ SOLN
INTRAMUSCULAR | Status: AC
  Filled 2016-08-11: qty 2

## 2016-08-11 MED ORDER — LOSARTAN POTASSIUM 50 MG PO TABS
50.0000 mg | ORAL_TABLET | Freq: Every day | ORAL | Status: DC
  Administered 2016-08-12 – 2016-08-15 (×4): 50 mg via ORAL
  Filled 2016-08-11 (×4): qty 1

## 2016-08-11 MED ORDER — ENOXAPARIN SODIUM 40 MG/0.4ML ~~LOC~~ SOLN
40.0000 mg | SUBCUTANEOUS | Status: DC
  Administered 2016-08-12: 40 mg via SUBCUTANEOUS
  Filled 2016-08-11: qty 0.4

## 2016-08-11 MED ORDER — PHENYLEPHRINE HCL 10 MG/ML IJ SOLN
INTRAVENOUS | Status: DC | PRN
  Administered 2016-08-11: 50 ug/min via INTRAVENOUS

## 2016-08-11 MED ORDER — ACETAMINOPHEN 650 MG RE SUPP: 650.0000 mg | Freq: Four times a day (QID) | RECTAL | Status: DC | PRN

## 2016-08-11 MED ORDER — FENTANYL CITRATE (PF) 100 MCG/2ML IJ SOLN
INTRAMUSCULAR | Status: AC
  Administered 2016-08-11: 25 ug via INTRAVENOUS
  Filled 2016-08-11: qty 2

## 2016-08-11 MED ORDER — FENTANYL CITRATE (PF) 100 MCG/2ML IJ SOLN: 25.0000 ug | INTRAMUSCULAR | Status: DC | PRN

## 2016-08-11 MED ORDER — METOCLOPRAMIDE HCL 5 MG PO TABS: 5.0000 mg | ORAL_TABLET | Freq: Three times a day (TID) | ORAL | Status: DC | PRN

## 2016-08-11 MED ORDER — ACETAMINOPHEN 325 MG PO TABS
650.0000 mg | ORAL_TABLET | Freq: Four times a day (QID) | ORAL | Status: DC | PRN
  Administered 2016-08-12 – 2016-08-14 (×5): 650 mg via ORAL
  Filled 2016-08-11 (×5): qty 2

## 2016-08-11 MED ORDER — KETOROLAC TROMETHAMINE 30 MG/ML IJ SOLN
INTRAMUSCULAR | Status: AC
  Filled 2016-08-11: qty 1

## 2016-08-11 MED ORDER — DIPHENHYDRAMINE HCL 12.5 MG/5ML PO ELIX: 12.5000 mg | ORAL_SOLUTION | ORAL | Status: DC | PRN

## 2016-08-11 MED ORDER — ALBUTEROL SULFATE (2.5 MG/3ML) 0.083% IN NEBU: 2.5000 mg | INHALATION_SOLUTION | Freq: Four times a day (QID) | RESPIRATORY_TRACT | Status: DC | PRN

## 2016-08-11 MED ORDER — SUCCINYLCHOLINE CHLORIDE 200 MG/10ML IV SOSY
PREFILLED_SYRINGE | INTRAVENOUS | Status: AC
  Filled 2016-08-11: qty 10

## 2016-08-11 MED ORDER — SODIUM CHLORIDE 0.9 % IR SOLN
Status: DC | PRN
  Administered 2016-08-11 (×3): 3000 mL

## 2016-08-11 MED ORDER — FENTANYL CITRATE (PF) 100 MCG/2ML IJ SOLN
INTRAMUSCULAR | Status: DC | PRN
  Administered 2016-08-11: 50 ug via INTRAVENOUS
  Administered 2016-08-11: 25 ug via INTRAVENOUS
  Administered 2016-08-11: 100 ug via INTRAVENOUS
  Administered 2016-08-11: 50 ug via INTRAVENOUS
  Administered 2016-08-11: 25 ug via INTRAVENOUS
  Administered 2016-08-11: 50 ug via INTRAVENOUS
  Administered 2016-08-11: 25 ug via INTRAVENOUS

## 2016-08-11 MED ORDER — ONDANSETRON HCL 4 MG/2ML IJ SOLN
4.0000 mg | Freq: Four times a day (QID) | INTRAMUSCULAR | Status: DC | PRN
  Administered 2016-08-13 – 2016-08-15 (×2): 4 mg via INTRAVENOUS
  Filled 2016-08-11 (×2): qty 2

## 2016-08-11 MED ORDER — MENTHOL 3 MG MT LOZG: 1.0000 | LOZENGE | OROMUCOSAL | Status: DC | PRN

## 2016-08-11 MED ORDER — CEFAZOLIN SODIUM-DEXTROSE 2-4 GM/100ML-% IV SOLN
2.0000 g | Freq: Three times a day (TID) | INTRAVENOUS | Status: DC
  Administered 2016-08-12 (×2): 2 g via INTRAVENOUS
  Filled 2016-08-11 (×3): qty 100

## 2016-08-11 MED ORDER — 0.9 % SODIUM CHLORIDE (POUR BTL) OPTIME
TOPICAL | Status: DC | PRN
  Administered 2016-08-11: 1000 mL

## 2016-08-11 MED ORDER — POLYETHYLENE GLYCOL 3350 17 G PO PACK
17.0000 g | PACK | Freq: Every day | ORAL | Status: DC | PRN
  Administered 2016-08-12: 17 g via ORAL
  Filled 2016-08-11: qty 1

## 2016-08-11 MED ORDER — LIDOCAINE 2% (20 MG/ML) 5 ML SYRINGE
INTRAMUSCULAR | Status: AC
  Filled 2016-08-11: qty 5

## 2016-08-11 MED ORDER — ALUM & MAG HYDROXIDE-SIMETH 200-200-20 MG/5ML PO SUSP: 30.0000 mL | ORAL | Status: DC | PRN

## 2016-08-11 MED ORDER — DEXAMETHASONE SODIUM PHOSPHATE 10 MG/ML IJ SOLN
10.0000 mg | Freq: Once | INTRAMUSCULAR | Status: AC
  Administered 2016-08-12: 10 mg via INTRAVENOUS
  Filled 2016-08-11: qty 1

## 2016-08-11 MED ORDER — SODIUM CHLORIDE 0.9 % IV SOLN
1000.0000 mg | Freq: Once | INTRAVENOUS | Status: DC
  Filled 2016-08-11 (×2): qty 10

## 2016-08-11 MED ORDER — DOCUSATE SODIUM 100 MG PO CAPS
100.0000 mg | ORAL_CAPSULE | Freq: Two times a day (BID) | ORAL | Status: DC
  Administered 2016-08-11 – 2016-08-15 (×8): 100 mg via ORAL
  Filled 2016-08-11 (×8): qty 1

## 2016-08-11 MED ORDER — METHYLPHENIDATE HCL 5 MG PO TABS: 20.0000 mg | ORAL_TABLET | Freq: Two times a day (BID) | ORAL | Status: DC | PRN

## 2016-08-11 MED ORDER — SODIUM CHLORIDE 0.9 % IV SOLN
INTRAVENOUS | Status: DC | PRN
  Administered 2016-08-11: 500 mL

## 2016-08-11 MED ORDER — RISAQUAD PO CAPS
1.0000 | ORAL_CAPSULE | Freq: Every day | ORAL | Status: DC
  Administered 2016-08-12 – 2016-08-14 (×4): 1 via ORAL
  Filled 2016-08-11 (×4): qty 1

## 2016-08-11 MED ORDER — METHOCARBAMOL 1000 MG/10ML IJ SOLN
500.0000 mg | Freq: Four times a day (QID) | INTRAVENOUS | Status: DC | PRN
  Filled 2016-08-11: qty 5

## 2016-08-11 MED ORDER — RIFAMPIN 300 MG PO CAPS
300.0000 mg | ORAL_CAPSULE | Freq: Two times a day (BID) | ORAL | Status: DC
  Administered 2016-08-11 – 2016-08-15 (×8): 300 mg via ORAL
  Filled 2016-08-11 (×8): qty 1

## 2016-08-11 MED ORDER — HYDROMORPHONE HCL 1 MG/ML IJ SOLN
0.5000 mg | INTRAMUSCULAR | Status: DC | PRN
  Administered 2016-08-14 (×2): 1 mg via INTRAVENOUS
  Administered 2016-08-14: 2 mg via INTRAVENOUS
  Administered 2016-08-14: 0.5 mg via INTRAVENOUS
  Administered 2016-08-14 – 2016-08-15 (×4): 1 mg via INTRAVENOUS
  Filled 2016-08-11 (×8): qty 1

## 2016-08-11 MED ORDER — CARVEDILOL 12.5 MG PO TABS
12.5000 mg | ORAL_TABLET | Freq: Two times a day (BID) | ORAL | Status: DC
Start: 2016-08-12 — End: 2016-08-15
  Administered 2016-08-12 – 2016-08-15 (×7): 12.5 mg via ORAL
  Filled 2016-08-11 (×7): qty 1

## 2016-08-11 MED ORDER — ACETAMINOPHEN 10 MG/ML IV SOLN
INTRAVENOUS | Status: DC | PRN
  Administered 2016-08-11: 1000 mg via INTRAVENOUS

## 2016-08-11 MED ORDER — ONDANSETRON HCL 4 MG/2ML IJ SOLN
INTRAMUSCULAR | Status: DC | PRN
  Administered 2016-08-11: 4 mg via INTRAVENOUS

## 2016-08-11 MED ORDER — DEXAMETHASONE SODIUM PHOSPHATE 4 MG/ML IJ SOLN
INTRAMUSCULAR | Status: DC | PRN
  Administered 2016-08-11: 4 mg via INTRAVENOUS

## 2016-08-11 MED ORDER — SODIUM CHLORIDE 0.9 % IV SOLN: 1000.0000 mg | INTRAVENOUS | Status: DC

## 2016-08-11 MED ORDER — ETOMIDATE 2 MG/ML IV SOLN
INTRAVENOUS | Status: AC
  Filled 2016-08-11: qty 10

## 2016-08-11 MED ORDER — KETOROLAC TROMETHAMINE 15 MG/ML IJ SOLN
15.0000 mg | Freq: Four times a day (QID) | INTRAMUSCULAR | Status: AC
  Administered 2016-08-12 (×3): 15 mg via INTRAVENOUS
  Filled 2016-08-11 (×3): qty 1

## 2016-08-11 MED ORDER — ETOMIDATE 2 MG/ML IV SOLN
INTRAVENOUS | Status: DC | PRN
  Administered 2016-08-11: 16 mg via INTRAVENOUS

## 2016-08-11 MED ORDER — ACETAMINOPHEN 10 MG/ML IV SOLN
INTRAVENOUS | Status: AC
  Filled 2016-08-11: qty 100

## 2016-08-11 MED ORDER — PHENOL 1.4 % MT LIQD: 1.0000 | OROMUCOSAL | Status: DC | PRN

## 2016-08-11 SURGICAL SUPPLY — 50 items
ADH SKN CLS APL DERMABOND .7 (GAUZE/BANDAGES/DRESSINGS) ×1
ALCOHOL ISOPROPYL (RUBBING) (MISCELLANEOUS) ×3 IMPLANT
BANDAGE ACE 6X5 VEL STRL LF (GAUZE/BANDAGES/DRESSINGS) ×3 IMPLANT
BANDAGE ELASTIC 4 VELCRO ST LF (GAUZE/BANDAGES/DRESSINGS) ×2 IMPLANT
BANDAGE ELASTIC 6 VELCRO ST LF (GAUZE/BANDAGES/DRESSINGS) ×2 IMPLANT
BLADE SAW RECIP 87.9 MT (BLADE) ×3 IMPLANT
CHLORAPREP W/TINT 26ML (MISCELLANEOUS) ×6 IMPLANT
CUFF TOURNIQUET SINGLE 34IN LL (TOURNIQUET CUFF) ×3 IMPLANT
DERMABOND ADVANCED (GAUZE/BANDAGES/DRESSINGS) ×2
DERMABOND ADVANCED .7 DNX12 (GAUZE/BANDAGES/DRESSINGS) ×1 IMPLANT
DRAIN HEMOVAC 7FR (DRAIN) ×3 IMPLANT
DRAPE SHEET LG 3/4 BI-LAMINATE (DRAPES) ×6 IMPLANT
DRAPE U-SHAPE 47X51 STRL (DRAPES) ×3 IMPLANT
DRAPE UNIVERSAL PACK (DRAPES) ×3 IMPLANT
DRSG AQUACEL AG ADV 3.5X14 (GAUZE/BANDAGES/DRESSINGS) ×3 IMPLANT
DRSG TEGADERM 2-3/8X2-3/4 SM (GAUZE/BANDAGES/DRESSINGS) ×3 IMPLANT
DRSG TEGADERM 4X4.75 (GAUZE/BANDAGES/DRESSINGS) ×2 IMPLANT
DRSG VAC ATS MED SENSATRAC (GAUZE/BANDAGES/DRESSINGS) ×2 IMPLANT
ELECT REM PT RETURN 9FT ADLT (ELECTROSURGICAL) ×3
ELECTRODE REM PT RTRN 9FT ADLT (ELECTROSURGICAL) ×1 IMPLANT
EVACUATOR 1/8 PVC DRAIN (DRAIN) IMPLANT
GAUZE SPONGE 2X2 8PLY STRL LF (GAUZE/BANDAGES/DRESSINGS) IMPLANT
GLOVE BIO SURGEON STRL SZ8.5 (GLOVE) ×6 IMPLANT
GLOVE BIOGEL PI IND STRL 8.5 (GLOVE) ×1 IMPLANT
GLOVE BIOGEL PI INDICATOR 8.5 (GLOVE) ×2
GOWN STRL REUS W/TWL 2XL LVL3 (GOWN DISPOSABLE) ×3 IMPLANT
HANDPIECE INTERPULSE COAX TIP (DISPOSABLE) ×3
INSERT PFC SIG STB SZ3 15.0MM (Knees) ×2 IMPLANT
KIT BASIN OR (CUSTOM PROCEDURE TRAY) ×3 IMPLANT
MANIFOLD NEPTUNE II (INSTRUMENTS) ×3 IMPLANT
NDL SPNL 18GX3.5 QUINCKE PK (NEEDLE) ×2 IMPLANT
NEEDLE SPNL 18GX3.5 QUINCKE PK (NEEDLE) ×6 IMPLANT
PACK TOTAL JOINT (CUSTOM PROCEDURE TRAY) ×3 IMPLANT
PACK TOTAL KNEE CUSTOM (KITS) ×3 IMPLANT
PADDING CAST COTTON 6X4 STRL (CAST SUPPLIES) ×3 IMPLANT
SAW OSC TIP CART 19.5X105X1.3 (SAW) ×3 IMPLANT
SEALER BIPOLAR AQUA 6.0 (INSTRUMENTS) ×3 IMPLANT
SET HNDPC FAN SPRY TIP SCT (DISPOSABLE) ×1 IMPLANT
SET PAD KNEE POSITIONER (MISCELLANEOUS) ×3 IMPLANT
SPONGE GAUZE 2X2 STER 10/PKG (GAUZE/BANDAGES/DRESSINGS) ×2
SUT MNCRL AB 3-0 PS2 27 (SUTURE) ×3 IMPLANT
SUT MON AB 2-0 CT1 36 (SUTURE) ×6 IMPLANT
SUT VIC AB 1 CTX 27 (SUTURE) ×3 IMPLANT
SUT VIC AB 2-0 CT1 27 (SUTURE) ×3
SUT VIC AB 2-0 CT1 TAPERPNT 27 (SUTURE) ×1 IMPLANT
SUT VLOC 180 0 24IN GS25 (SUTURE) ×3 IMPLANT
SYR 50ML LL SCALE MARK (SYRINGE) ×6 IMPLANT
TOWER CARTRIDGE SMART MIX (DISPOSABLE) ×3 IMPLANT
TRAY CATH 16FR W/PLASTIC CATH (SET/KITS/TRAYS/PACK) IMPLANT
WRAP KNEE MAXI GEL POST OP (GAUZE/BANDAGES/DRESSINGS) ×3 IMPLANT

## 2016-08-11 NOTE — ED Notes (Signed)
Perihpreal IVB attempt x2, unsuccessful. R forearm, L AC.

## 2016-08-11 NOTE — ED Notes (Signed)
Pt given small amount of ice chips, per PA abigail.

## 2016-08-11 NOTE — Anesthesia Postprocedure Evaluation (Signed)
Anesthesia Post Note  Patient: [name] [name]  Procedure(s) Performed: Procedure(s) (LRB): IRRIGATION AND DEBRIDEMENT KNEE WITH POLY EXCHANGE (Right)     Patient location during evaluation: PACU Anesthesia Type: General Level of consciousness: awake Pain management: pain level controlled Vital Signs Assessment: post-procedure vital signs reviewed and stable Respiratory status: spontaneous breathing Cardiovascular status: stable Postop Assessment: no signs of nausea or vomiting Anesthetic complications: no    Last Vitals:  Vitals:   08/11/16 1828 08/11/16 1855  BP: (!) 118/51   Pulse: 89   Resp: 14   Temp:  36.6 C    Last Pain:  Vitals:   08/11/16 1051  TempSrc:   PainSc: 10-Worst pain ever                 Jahnavi Muratore

## 2016-08-11 NOTE — ED Notes (Signed)
[name] [name]

## 2016-08-11 NOTE — Progress Notes (Signed)
Patient has Biotronik  AICD . Dr. Gerre Pebbles EP MD on [phone] he returned my page.  Recommended interogation of AICD in am . And patient to be monitored postop. Biotronik rep Derek Jack call at [phone]. He stated he would be at hospital tomorrow and proceed with interogation and place report on chart.

## 2016-08-11 NOTE — Brief Op Note (Signed)
08/11/2016  6:20 PM  PATIENT:  [name] [name]  81 y.o. female  PRE-OPERATIVE DIAGNOSIS:  right infected knee  POST-OPERATIVE DIAGNOSIS:  right infected knee  PROCEDURE:  Procedure(s): IRRIGATION AND DEBRIDEMENT KNEE WITH POLY EXCHANGE (Right)  SURGEON:  Surgeon(s) and Role:    * Yariah Selvey, Arlys John, MD - Primary  PHYSICIAN ASSISTANT: bryson stilwell, pa-c  ASSISTANTS: staff   ANESTHESIA:   general  EBL:  Total I/O In: 500 [I.V.:500] Out: 100 [Blood:100]  BLOOD ADMINISTERED:none  DRAINS: medium HV and incisional VAC   LOCAL MEDICATIONS USED:  MARCAINE     SPECIMEN:  Source of Specimen:  right knee capsule for tissue culture  DISPOSITION OF SPECIMEN:  micro  COUNTS:  YES  TOURNIQUET:  53 min  DICTATION: .Other Dictation: Dictation Number [id number]  PLAN OF CARE: Admit to inpatient   PATIENT DISPOSITION:  PACU - hemodynamically stable.   Delay start of Pharmacological VTE agent (>24hrs) due to surgical blood loss or risk of bleeding: no

## 2016-08-11 NOTE — Transfer of Care (Signed)
Immediate Anesthesia Transfer of Care Note  Patient: [name] [name]  Procedure(s) Performed: Procedure(s): IRRIGATION AND DEBRIDEMENT KNEE WITH POLY EXCHANGE (Right)  Patient Location: PACU  Anesthesia Type:General  Level of Consciousness: awake, alert  and oriented  Airway & Oxygen Therapy: Patient Spontanous Breathing and Patient connected to nasal cannula oxygen  Post-op Assessment: Report given to RN and Post -op Vital signs reviewed and stable  Post vital signs: Reviewed and stable  Last Vitals:  Vitals:   08/11/16 1345 08/11/16 1415  BP: (!) 108/92   Pulse: 99 98  Resp: 19 18  Temp:      Last Pain:  Vitals:   08/11/16 1051  TempSrc:   PainSc: 10-Worst pain ever         Complications: No apparent anesthesia complications

## 2016-08-11 NOTE — Anesthesia Preprocedure Evaluation (Addendum)
Anesthesia Evaluation  Patient identified by MRN, date of birth, ID band Patient awake    Reviewed: Allergy & Precautions, NPO status , Patient's Chart, lab work & pertinent test results  Airway Mallampati: II  TM Distance: >3 FB     Dental  (+) Teeth Intact, Dental Advisory Given, Caps   Pulmonary asthma , pneumonia,    breath sounds clear to auscultation       Cardiovascular + angina + CAD, + Past MI and +CHF  + dysrhythmias + pacemaker + Cardiac Defibrillator + Valvular Problems/Murmurs  Rhythm:Regular Rate:Normal     Neuro/Psych  Headaches,  Neuromuscular disease    GI/Hepatic GERD  ,  Endo/Other  Hypothyroidism   Renal/GU      Musculoskeletal  (+) Arthritis ,   Abdominal   Peds  Hematology  (+) anemia ,   Anesthesia Other Findings   Reproductive/Obstetrics                           Anesthesia Physical Anesthesia Plan  ASA: IV  Anesthesia Plan: General   Post-op Pain Management:    Induction: Intravenous  PONV Risk Score and Plan: 3 and Ondansetron, Dexamethasone and Propofol infusion  Airway Management Planned: Oral ETT  Additional Equipment:   Intra-op Plan:   Post-operative Plan: Possible Post-op intubation/ventilation  Informed Consent: I have reviewed the patients History and Physical, chart, labs and discussed the procedure including the risks, benefits and alternatives for the proposed anesthesia with the patient or authorized representative who has indicated his/her understanding and acceptance.   Dental advisory given  Plan Discussed with: CRNA, Anesthesiologist and Surgeon  Anesthesia Plan Comments:         Anesthesia Quick Evaluation

## 2016-08-11 NOTE — H&P (Signed)
History and Physical    [name] [name] JJO:[id number] DOB: 1935/01/30 DOA: 08/11/2016  PCP: Leonard Downing, MD Consultants:  [place] - [place]; Franne Grip - cardiology; Aluis[place], [place] - ortho Patient coming from: home - lives with husband  Chief Complaint: knee infection  HPI: [name] [name] is a 81 y.o. female with medical history significant of congestive dilated cardiomyopathy; chronic systolic CHF; hypothyroidism; AICD/pacer in place; s/p lumbar fusion in 4/18; and admission from 6/7-18 with MSSA bacteremia of uncertain etiology.  She "felt great" upon completing her antibiotics on 7/5.  She had a bilateral TKR 14 years ago and on Thursday (8/2) noticed R knee stiffness/pain.  On Friday (8/3) it was swollen and hot. It seemed to get better yesterday and so she thought it might be osteoarthritis.  However, when she awoke today, it was the size of a melon and very hot, "it was horrible."  No fever.  With the previous infection, she was initially hospitalized at [facility] on [facility], transferred to [facility] on 6/15.  She developed severe sepsis and had 2/2 blood cultures positive on 6/7.  She had an LP and a CT chest that suggested RML and lingular infiltrates.  She was treated at various times with Vanc, Rocephin, Levaquin, and Doxy and she was eventually changed to Ancef per ID recommendations and completed 4 weeks of therapy.  TTE and TEE were negative.  Pacemaker site looked clean and dry but the thought was that any signs of recurrent infection would lead to the device likely being removed.  .    ED Course: Grossly purulent fluid on right knee arthrocentesis and so patient taken for washout by orthopedics.  Review of Systems: As per HPI; otherwise review of systems reviewed and negative.    Past Medical History:  Diagnosis Date  . 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 AICD (automatic  cardioverter/defibrillator) present   . Anemia    hx  . Angina effort ([place])   . Arthritis    "finger joints" (06/21/2016)  . Asthma   . CHF (congestive heart failure) ([place])   . Congestive dilated cardiomyopathy ([place]) 03/31/2014  . Daily headache    "in the last week" (615/2018)  . GERD (gastroesophageal reflux disease)   . Heart murmur   . History of blood transfusion 1990s   "related to hysterectomy"  . Hypothyroidism   . LBBB (left bundle branch block) 10/30/2013  . Left bundle branch block   . MSSA bacteremia 06/21/2016  . Myocardial infarction ([place])    "I've been told I've had some; I've never felt them" (06/21/2016)  . Pancreatitis, acute   . Pneumonia 06/2016  . Presence of permanent cardiac pacemaker   . Thyroid disease     Past Surgical History:  Procedure Laterality Date  . ABDOMINAL HYSTERECTOMY    . APPENDECTOMY    . CARDIAC CATHETERIZATION    . CARDIAC DEFIBRILLATOR PLACEMENT  ?2016  . CATARACT EXTRACTION W/ INTRAOCULAR LENS  IMPLANT, BILATERAL Bilateral   . CHOLECYSTECTOMY OPEN     "related to pancreatitis"  . DILATION AND CURETTAGE OF UTERUS    . EP IMPLANTABLE DEVICE N/A 07/04/2014   Procedure: BiV ICD Insertion CRT-D;  Surgeon: Deboraha Sprang, MD;  Location: [facility];  Service: Cardiovascular;  Laterality: N/A;  . HERNIA REPAIR    . INSERT / REPLACE / REMOVE PACEMAKER  ?2016  .  JOINT REPLACEMENT Bilateral    "thumb joints; used my own material"  . JOINT REPLACEMENT    . LEFT HEART CATHETERIZATION WITH CORONARY ANGIOGRAM N/A 11/02/2013   Procedure: LEFT HEART CATHETERIZATION WITH CORONARY ANGIOGRAM;  Surgeon: Jettie Booze, MD;  Location[facility] C[facility] LAB;  Service: Cardiovascular;  Laterality: N/A;  . LOOP RECORDER IMPLANT N/A 04/13/2014   Procedure: LOOP RECORDER IMPLANT;  Surgeon: Deboraha Sprang, MD;  Location: [facility] CATH LAB;  Service: Cardiovascular;  Laterality: N/A;  . LOOP RECORDER REMOVAL  ?2016  . POSTERIOR LUMBAR FUSION  05/03/2016    Lumbar Two-Three, Lumbar Three-Four Posterior Lumbar Fusion withLaminectomy and Foraminotomy  . SPINE SURGERY  04/2016   lumbar   . TONSILLECTOMY    . TOTAL HIP ARTHROPLASTY Bilateral   . UMBILICAL HERNIA REPAIR  1970s?    Social History   Social History  . Marital status: Married    Spouse name: N/A  . Number of children: N/A  . Years of education: N/A   Occupational History  . Not on file.   Social History Main Topics  . Smoking status: Never Smoker  . Smokeless tobacco: Never Used     Comment: "smoked as a teenager; for maybe 1 wk"  . Alcohol use Yes     Comment: 6/15//2018 "glass of wine q 6 months or so"  . Drug use: No  . Sexual activity: Not on file   Other Topics Concern  . Not on file   Social History Narrative  . No narrative on file    Allergies  Allergen Reactions  . Sulfa Antibiotics Hives    Family History  Problem Relation Age of Onset  . Hyperlipidemia Mother   . Arrhythmia Mother        palpitations  . Heart disease Father   . Heart attack Father   . Heart attack Maternal Grandfather   . Heart attack Paternal Grandfather     Prior to Admission medications   Medication Sig Start Date End Date Taking? Authorizing Provider  albuterol (PROAIR HFA) 108 (90 Base) MCG/ACT inhaler Inhale 2 puffs into the lungs every 6 (six) hours as needed for wheezing or shortness of breath.    [provider]  aspirin EC 81 MG tablet Take 81 mg by mouth daily.    [provider]  aspirin-acetaminophen-caffeine (EXCEDRIN MIGRAINE) [phone] MG per tablet Take 1-2 tablets by mouth every 6 (six) hours as needed for headache or migraine.     [provider]  augmented betamethasone dipropionate (DIPROLENE-AF) 0.05 % ointment Apply 1 application topically daily as needed (acrodermatitis [place] on fingers/toes).     [provider]  Calcium Carbonate Antacid (TUMS PO) Take 2 tablets by mouth daily as needed (indigestion).     [provider]  carvedilol (COREG) 12.5 MG tablet Take 1 tablet (12.5 mg total) by mouth 2 (two) times daily. 12/19/15   Dorothy Spark, MD  furosemide (LASIX) 20 MG tablet Take 1 tablet (20 mg total) by mouth daily as needed for edema. Patient taking differently: Take 20 mg by mouth daily as needed for fluid or edema.  09/07/15   Dorothy Spark, MD  levothyroxine (SYNTHROID, LEVOTHROID) 112 MCG tablet Take 112 mcg by mouth daily before breakfast.    [provider]  losartan (COZAAR) 50 MG tablet Take 1 tablet (50 mg total) by mouth daily. 07/16/16 10/14/16  Dorothy Spark, MD  Melatonin 5 MG TABS Take 5 mg by mouth at bedtime as  needed (for sleep.).    [provider]  methocarbamol (ROBAXIN) 750 MG tablet Take 1 tablet (750 mg total) by mouth 2 (two) times daily as needed. For leg spasms. Patient taking differently: Take 750 mg by mouth 2 (two) times daily as needed for muscle spasms (leg spasms).  05/05/16   Costella, Vista Mink, PA-C  methylphenidate (RITALIN) 20 MG tablet Take 20 mg by mouth 2 (two) times daily as needed (narcolepsy).  02/22/16   [provider]  Naphazoline-Pheniramine (ALLERGY EYE OP) Place 1-2 drops into both eyes daily as needed (for irritated eyes).    [provider]  ondansetron (ZOFRAN) 4 MG tablet Take 0.5 tablets (2 mg total) by mouth every 8 (eight) hours as needed for nausea or vomiting. 12/19/15   Dorothy Spark, MD  ondansetron (ZOFRAN-ODT) 4 MG disintegrating tablet Take 4 mg by mouth every 6 (six) hours as needed. 06/11/16   [provider]  Probiotic Product (PROBIOTIC PO) Take 1 capsule by mouth at bedtime.     [provider]    Physical Exam: Vitals:   08/11/16 1855 08/11/16 1915 08/11/16 1945 08/11/16 2100  BP:   (!) 130/95 (!) 112/48  Pulse:    83  Resp:    18  Temp: 97.8 F (36.6 C)  97.7 F (36.5 C) 97.7 F (36.5 C)  TempSrc:    Oral  SpO2:  97%  100%  Weight:    68 kg  (150 lb)  Height:    _0  (1.473 m)     General:  Appears calm and comfortable and is NAD Eyes:  EOMI, normal lids, iris ENT:  grossly normal hearing, lips & tongue, mmm Neck:  no LAD, masses or thyromegaly Cardiovascular:  RRR, no m/r/g. No LE edema.  Respiratory:  CTA bilaterally, no w/r/r. Normal respiratory effort. Abdomen:  soft, ntnd, NABS Skin:  no rash or induration seen on limited exam Musculoskeletal:  Right knee is in an immobilizer with drain(s) in place Psychiatric:  grossly normal mood and affect, speech fluent and appropriate, AOx3 Neurologic:  CN 2-12 grossly intact, moves all extremities in coordinated fashion, sensation intact  Labs on Admission: I have personally reviewed following labs and imaging studies  CBC:  Recent Labs Lab 08/11/16 1100  WBC 8.0  NEUTROABS 6.5  HGB 11.3*  HCT 34.7*  MCV 93.0  PLT 778   Basic Metabolic Panel:  Recent Labs Lab 08/11/16 1100  NA 137  K 4.2  CL 103  CO2 24  GLUCOSE 121*  BUN 17  CREATININE 1.01*  CALCIUM 9.3   GFR: Estimated Creatinine Clearance: 37.5 mL/min (A) (by C-G formula based on SCr of 1.01 mg/dL (H)). Liver Function Tests:  Recent Labs Lab 08/11/16 1100  AST 16  ALT 8*  ALKPHOS 73  BILITOT 0.7  PROT 7.7  ALBUMIN 3.3*   No results for input(s): LIPASE, AMYLASE in the last 168 hours. No results for input(s): AMMONIA in the last 168 hours. Coagulation Profile:  Recent Labs Lab 08/11/16 1100  INR 1.00   Cardiac Enzymes: No results for input(s): CKTOTAL, CKMB, CKMBINDEX, TROPONINI in the last 168 hours. BNP (last 3 results) No results for input(s): PROBNP in the last 8760 hours. HbA1C: No results for input(s): HGBA1C in the last 72 hours. CBG: No results for input(s): GLUCAP in the last 168 hours. Lipid Profile: No results for input(s): CHOL, HDL, LDLCALC, TRIG, CHOLHDL, LDLDIRECT in the last 72 hours. Thyroid Function Tests: No results for input(s):  TSH, T4TOTAL, FREET4,  T3FREE, THYROIDAB in the last 72 hours. Anemia Panel: No results for input(s): VITAMINB12, FOLATE, FERRITIN, TIBC, IRON, RETICCTPCT in the last 72 hours. Urine analysis:    Component Value Date/Time   COLORURINE YELLOW 08/11/2016 1429   APPEARANCEUR CLEAR 08/11/2016 1429   LABSPEC 1.025 08/11/2016 1429   PHURINE 5.5 08/11/2016 1429   GLUCOSEU NEGATIVE 08/11/2016 1429   HGBUR TRACE (A) 08/11/2016 1429   BILIRUBINUR NEGATIVE 08/11/2016 1429   KETONESUR NEGATIVE 08/11/2016 1429   PROTEINUR 100 (A) 08/11/2016 1429   NITRITE NEGATIVE 08/11/2016 1429   LEUKOCYTESUR NEGATIVE 08/11/2016 1429    Creatinine Clearance: Estimated Creatinine Clearance: 37.5 mL/min (A) (by C-G formula based on SCr of 1.01 mg/dL (H)).  Sepsis Labs: _0 (procalcitonin:4,lacticidven:4) ) Recent Results (from the past 240 hour(s))  Gram stain     Status: None   Collection Time: 08/11/16  1:46 PM  Result Value Ref Range Status   Specimen Description FLUID SYNOVIAL RIGHT KNEE  Final   Special Requests NONE  Final   Gram Stain   Final    ABUNDANT WBC PRESENT, PREDOMINANTLY PMN RARE INTRACELLULAR GRAM POSITIVE COCCI IN PAIRS    Report Status 08/11/2016 FINAL  Final  Aerobic/Anaerobic Culture (surgical/deep wound)     Status: None (Preliminary result)   Collection Time: 08/11/16  4:55 PM  Result Value Ref Range Status   Specimen Description KNEE RIGHT TISSUE  Final   Special Requests SPEC A  Final   Gram Stain   Final    FEW WBC PRESENT, PREDOMINANTLY PMN RARE GRAM POSITIVE COCCI IN PAIRS Gram Stain Report Called to,Read Back By and Verified With: A COLLINS,RN AT 1802 08/11/16 BY L BENFIELD    Culture PENDING  Incomplete   Report Status PENDING  Incomplete     Radiological Exams on Admission: Dg Knee 1-2 Views Right  Result Date: 08/11/2016 CLINICAL DATA:  81 year old female with painful right total knee replacement. Swelling and pain for 2 days. EXAM: RIGHT KNEE - 1-2 VIEW COMPARISON:  None.  FINDINGS: Large joint effusion evident on the cross-table lateral view. Total knee arthroplasty hardware appears intact and normally aligned. Patella appears intact. Dystrophic calcifications at the quadriceps tendon. No acute osseous abnormality identified. IMPRESSION: Large joint effusion. No acute osseous abnormality or adverse hardware features identified. Electronically Signed   By: Genevie Ann M.D.   On: 08/11/2016 13:34   Dg Knee Right Port  Result Date: 08/11/2016 CLINICAL DATA:  S/P IRRIGATION AND DEBRIDEMENT KNEE WITH POLY EXCHANGE (Right Leg Upper) EXAM: PORTABLE RIGHT KNEE - 1-2 VIEW COMPARISON:  08/11/2016 FINDINGS: Status post right knee arthroplasty. Locules of gas are identified in the joint space and soft tissues. A surgical drain is identified in the joint space. There is no acute fracture. IMPRESSION: Soft tissue gas and surgical drain following urination. Electronically Signed   By: Nolon Nations M.D.   On: 08/11/2016 19:07    EKG: Independently reviewed.  Atrial-sensed ventricular-paced rhythm with rate 95; similar to prior EKG  Assessment/Plan Principal Problem:   Septic arthritis (HCC) Active Problems:   Chronic systolic CHF (congestive heart failure), NYHA class 3 (HCC)   Hypothyroidism   Biventricular ICD (implantable cardioverter-defibrillator) in place   S/P lumbar fusion   MSSA bacteremia   Infection of total right knee replacement ([place])   Pertinent labs: UA unremarkable Knee fluid: turbid, no crystals, 23,310 WBC, 80% neutrophils Knee fluid gram stain: FEW WBC PRESENT, PREDOMINANTLY PMN ; RARE GRAM POSITIVE COCCI IN PAIRS CRP 19.8  ESR 74 Lactate 1.01 Blood cultures pending Glucose 121 BUN 17/Creatinine 1.01/GFR 52; prior 6/0.76/>60 INR 1  Septic arthritis in very remote TKR in a patient with recent admission for MSSA bacteremia -She tested positive for Staph aureus (MSSA) in 6/16 and 4/18 -Blood cultures here were negative during her last admission and  records are not currently available from [facility] -Patient was admitted for a prolonged hospitalization both at [facility] and at [facility]  -ID was consulted; Dr. Linus Salmons last saw the patient on 6/1 and recommended 4 weeks of IV Ancef through 7/5 and f/u blood cultures thereafter -Patient had a previously exhaustive search for the source of her bacteremia including TTE/TEE and LP but no source of identified -She once again appears to be seeding and this begs the question of a cardioembolic source -She will very likely once again need TEE -For now, will admit and resume Ancef -Recommend both ID and cardiology consults in the AM -The patient, who did test positive for MSSA in 4/18, had a decompressive laminectomy, facetectomy, and foraminotomies as well as lateral fusion using autologous bone graft mixed with allograft. -She also had a BIV ICD implanted in 6/16 and she was positive for MSSA at that time, as well. -If no additional sources of infection can be identified, both of these are potential niduses for infection and may need to be considered for removal. 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); given her propensity for rheumatologic conditions, an autoimmune phenomenon may be contributing to her recurrent presentation  CHF -h/o dilated cardiomyopathy -Appears to be compensated at this time -Limited Echo in 10/17 showed grade 1 diastolic dysfunction -Echo in 1/17 showed improved EF from 35-40% to 50-55%  Hypothyroidism -Check TSH -Continue Synthroid at current dose for now    DVT prophylaxis:  Lovenox  Code Status:  DNR - confirmed with patient Family Communication: None present Disposition Plan:  Home once clinically improved Consults called: Orthopedics; needs cardiology and ID in AM  Admission status: Admit - It is my clinical opinion that admission to INPATIENT is reasonable and necessary because this patient will require  at least 2 midnights in the hospital to treat this condition based on the medical complexity of the problems presented.  Given the aforementioned information, the predictability of an adverse outcome is felt to be significant.    Karmen Bongo MD [facility]  If 7PM-7AM, please contact night-coverage [email or web address] Password [email or web address]  08/12/2016, 2:39 AM

## 2016-08-11 NOTE — ED Triage Notes (Signed)
Pt reports hx of sepsis x 2. Now has right knee pain x 2 days, difficulty ambulating. Having fatigue and nausea, denies fever.

## 2016-08-11 NOTE — Discharge Instructions (Signed)
Dr. Samson Frederic Total Joint Specialist [facility] [address], [address] [place], [place] [address] [phone]  TOTAL KNEE REPLACEMENT POSTOPERATIVE DIRECTIONS  Knee Rehabilitation, Guidelines Following Surgery  Results after knee surgery are often greatly improved when you follow the exercise, range of motion and muscle strengthening exercises prescribed by your doctor. Safety measures are also important to protect the knee from further injury. Any time any of these exercises cause you to have increased pain or swelling in your knee joint, decrease the amount until you are comfortable again and slowly increase them. If you have problems or questions, call your caregiver or physical therapist for advice.   HOME CARE INSTRUCTIONS  Remove items at home which could result in a fall. This includes throw rugs or furniture in walking pathways.   ICE to the affected knee every three hours for 30 minutes at a time and then as needed for pain and swelling.  Continue to use ice on the knee for pain and swelling from surgery. You may notice swelling that will progress down to the foot and ankle.  This is normal after surgery.  Elevate the leg when you are not up walking on it.    Continue to use the breathing machine which will help keep your temperature down.  It is common for your temperature to cycle up and down following surgery, especially at night when you are not up moving around and exerting yourself.  The breathing machine keeps your lungs expanded and your temperature down.  Do not place pillow under knee, focus on keeping the knee straight while resting  DIET You may resume your previous home diet once your are discharged from the hospital.  DRESSING / WOUND CARE / SHOWERING You may change your dressing 3-5 days after surgery.  Then change the dressing every day with sterile gauze.  Please use good hand washing techniques before changing the dressing.  Do not use  any lotions or creams on the incision until instructed by your surgeon. You may start showering once you are discharged home but do not submerge the incision under water. Just pat the incision dry and apply a dry gauze dressing on daily. Change the surgical dressing daily and reapply a dry dressing each time.  ACTIVITY Walk with your walker as instructed. Use walker as long as suggested by your caregivers. Avoid periods of inactivity such as sitting longer than an hour when not asleep. This helps prevent blood clots.  You may resume a sexual relationship in one month or when given the OK by your doctor.  You may return to work once you are cleared by your doctor.  Do not drive a car for 6 weeks or until released by you surgeon.  Do not drive while taking narcotics.  WEIGHT BEARING Weight bearing as tolerated with assist device (walker, cane, etc) as directed, use it as long as suggested by your surgeon or therapist, typically at least 4-6 weeks.  POSTOPERATIVE CONSTIPATION PROTOCOL Constipation - defined medically as fewer than three stools per week and severe constipation as less than one stool per week.  One of the most common issues patients have following surgery is constipation.  Even if you have a regular bowel pattern at home, your normal regimen is likely to be disrupted due to multiple reasons following surgery.  Combination of anesthesia, postoperative narcotics, change in appetite and fluid intake all can affect your bowels.  In order to avoid complications following surgery, here are some recommendations  in order to help you during your recovery period.  Colace (docusate) - Pick up an over-the-counter form of Colace or another stool softener and take twice a day as long as you are requiring postoperative pain medications.  Take with a full glass of water daily.  If you experience loose stools or diarrhea, hold the colace until you stool forms back up.  If your symptoms do not get  better within 1 week or if they get worse, check with your doctor.  Dulcolax (bisacodyl) - Pick up over-the-counter and take as directed by the product packaging as needed to assist with the movement of your bowels.  Take with a full glass of water.  Use this product as needed if not relieved by Colace only.   MiraLax (polyethylene glycol) - Pick up over-the-counter to have on hand.  MiraLax is a solution that will increase the amount of water in your bowels to assist with bowel movements.  Take as directed and can mix with a glass of water, juice, soda, coffee, or tea.  Take if you go more than two days without a movement. Do not use MiraLax more than once per day. Call your doctor if you are still constipated or irregular after using this medication for 7 days in a row.  If you continue to have problems with postoperative constipation, please contact the office for further assistance and recommendations.  If you experience "the worst abdominal pain ever" or develop nausea or vomiting, please contact the office immediatly for further recommendations for treatment.  ITCHING  If you experience itching with your medications, try taking only a single pain pill, or even half a pain pill at a time.  You can also use Benadryl over the counter for itching or also to help with sleep.   TED HOSE STOCKINGS Wear the elastic stockings on both legs for three weeks following surgery during the day but you may remove then at night for sleeping.  MEDICATIONS See your medication summary on the After Visit Summary that the nursing staff will review with you prior to discharge.  You may have some home medications which will be placed on hold until you complete the course of blood thinner medication.  It is important for you to complete the blood thinner medication as prescribed by your surgeon.  Continue your approved medications as instructed at time of discharge.  PRECAUTIONS If you experience chest pain or  shortness of breath - call 911 immediately for transfer to the hospital emergency department.  If you develop a fever greater that 101 F, purulent drainage from wound, increased redness or drainage from wound, foul odor from the wound/dressing, or calf pain - CONTACT YOUR SURGEON.                                                   FOLLOW-UP APPOINTMENTS Make sure you keep all of your appointments after your operation with your surgeon and caregivers. You should call the office at the above phone number and make an appointment for approximately two weeks after the date of your surgery or on the date instructed by your surgeon outlined in the "After Visit Summary".   RANGE OF MOTION AND STRENGTHENING EXERCISES  Rehabilitation of the knee is important following a knee injury or an operation. After just a few days of immobilization, the muscles  of the thigh which control the knee become weakened and shrink (atrophy). Knee exercises are designed to build up the tone and strength of the thigh muscles and to improve knee motion. Often times heat used for twenty to thirty minutes before working out will loosen up your tissues and help with improving the range of motion but do not use heat for the first two weeks following surgery. These exercises can be done on a training (exercise) mat, on the floor, on a table or on a bed. Use what ever works the best and is most comfortable for you Knee exercises include:  Leg Lifts - While your knee is still immobilized in a splint or cast, you can do straight leg raises. Lift the leg to 60 degrees, hold for 3 sec, and slowly lower the leg. Repeat 10-20 times 2-3 times daily. Perform this exercise against resistance later as your knee gets better.  Quad and Hamstring Sets - Tighten up the muscle on the front of the thigh (Quad) and hold for 5-10 sec. Repeat this 10-20 times hourly. Hamstring sets are done by pushing the foot backward against an object and holding for 5-10 sec.  Repeat as with quad sets.   Leg Slides: Lying on your back, slowly slide your foot toward your buttocks, bending your knee up off the floor (only go as far as is comfortable). Then slowly slide your foot back down until your leg is flat on the floor again.  Angel Wings: Lying on your back spread your legs to the side as far apart as you can without causing discomfort.  A rehabilitation program following serious knee injuries can speed recovery and prevent re-injury in the future due to weakened muscles. Contact your doctor or a physical therapist for more information on knee rehabilitation.   IF YOU ARE TRANSFERRED TO A SKILLED REHAB FACILITY If the patient is transferred to a skilled rehab facility following release from the hospital, a list of the current medications will be sent to the facility for the patient to continue.  When discharged from the skilled rehab facility, please have the facility set up the patient's Home Health Physical Therapy prior to being released. Also, the skilled facility will be responsible for providing the patient with their medications at time of release from the facility to include their pain medication, the muscle relaxants, and their blood thinner medication. If the patient is still at the rehab facility at time of the two week follow up appointment, the skilled rehab facility will also need to assist the patient in arranging follow up appointment in our office and any transportation needs.  MAKE SURE YOU:  Understand these instructions.  Get help right away if you are not doing well or get worse.    Pick up stool softner and laxative for home use following surgery while on pain medications. Do not submerge incision under water. Please use good hand washing techniques while changing dressing each day. May shower starting three days after surgery. Please use a clean towel to pat the incision dry following showers. Continue to use ice for pain and swelling after  surgery. Do not use any lotions or creams on the incision until instructed by your surgeon.

## 2016-08-11 NOTE — Consult Note (Signed)
ORTHOPAEDIC CONSULTATION  REQUESTING PHYSICIAN: Mancel Bale, MD  PCP:  [name], MD  Chief Complaint: Right knee pain and swelling  HPI: [name] [name] is a 81 y.o. female who complains of right knee pain and swelling for the past 2-3 days. The onset of her pain was atraumatic and sudden. She has associated warmth and swelling to the right knee. She has history of right total knee replacement 04/12/2002 by Dr. Ollen Gross. Prior to this episode, she has not had any pain or issues with the knee. Of note, the patient had a lumbar fusion by Dr. Yetta Barre in April of this year. Approximately 6 weeks postop, she was admitted to [facility] for MSSA bacteremia. She had an extensive workup to find the source of the infection, and the workup was negative. She completed IV antibiotics about 2 weeks ago. She denies fevers or chills. She denies pain or swelling in other areas of the body. In the emergency department, her serum white blood cell count was normal.  Past Medical History:  Diagnosis Date  . 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 AICD (automatic cardioverter/defibrillator) present   . Anemia    hx  . Angina effort (HCC)   . Arthritis    "finger joints" (06/21/2016)  . Asthma   . CHF (congestive heart failure) (HCC)   . Congestive dilated cardiomyopathy (HCC) 03/31/2014  . Daily headache    "in the last week" (615/2018)  . GERD (gastroesophageal reflux disease)   . Heart murmur   . History of blood transfusion 1990s   "related to hysterectomy"  . Hypothyroidism   . LBBB (left bundle branch block) 10/30/2013  . Left bundle branch block   . MSSA bacteremia 06/21/2016  . Myocardial infarction (HCC)    "I've been told I've had some; I've never felt them" (06/21/2016)  . Pancreatitis, acute   . Pneumonia 06/2016  . Presence of permanent cardiac pacemaker   . Thyroid disease      Past Surgical History:  Procedure Laterality Date  . ABDOMINAL HYSTERECTOMY    . APPENDECTOMY    . CARDIAC CATHETERIZATION    . CARDIAC DEFIBRILLATOR PLACEMENT  ?2016  . CATARACT EXTRACTION W/ INTRAOCULAR LENS  IMPLANT, BILATERAL Bilateral   . CHOLECYSTECTOMY OPEN     "related to pancreatitis"  . DILATION AND CURETTAGE OF UTERUS    . EP IMPLANTABLE DEVICE N/A 07/04/2014   Procedure: BiV ICD Insertion CRT-D;  Surgeon: Duke Salvia, MD;  Location: [facility] INVASIVE CV LAB;  Service: Cardiovascular;  Laterality: N/A;  . HERNIA REPAIR    . INSERT / REPLACE / REMOVE PACEMAKER  ?2016  . JOINT REPLACEMENT Bilateral    "thumb joints; used my own material"  . JOINT REPLACEMENT    . LEFT HEART CATHETERIZATION WITH CORONARY ANGIOGRAM N/A 11/02/2013   Procedure: LEFT HEART CATHETERIZATION WITH CORONARY ANGIOGRAM;  Surgeon: Corky Crafts, MD;  Location: [facility] CATH LAB;  Service: Cardiovascular;  Laterality: N/A;  . LOOP RECORDER IMPLANT N/A 04/13/2014   Procedure: LOOP RECORDER IMPLANT;  Surgeon: Duke Salvia, MD;  Location: [facility] CATH LAB;  Service: Cardiovascular;  Laterality: N/A;  . LOOP RECORDER REMOVAL  ?2016  . POSTERIOR LUMBAR FUSION  05/03/2016   Lumbar Two-Three, Lumbar Three-Four Posterior Lumbar Fusion withLaminectomy and Foraminotomy  . SPINE SURGERY  04/2016   lumbar   . TONSILLECTOMY    .  TOTAL HIP ARTHROPLASTY Bilateral   . UMBILICAL HERNIA REPAIR  1970s?   Social History   Social History  . Marital status: Married    Spouse name: N/A  . Number of children: N/A  . Years of education: N/A   Social History Main Topics  . Smoking status: Never Smoker  . Smokeless tobacco: Never Used     Comment: "smoked as a teenager; for maybe 1 wk"  . Alcohol use Yes     Comment: 6/15//2018 "glass of wine q 6 months or so"  . Drug use: No  . Sexual activity: Not Asked   Other Topics Concern  . None   Social History Narrative  . None   Family History  Problem Relation Age of  Onset  . Hyperlipidemia Mother   . Arrhythmia Mother        palpitations  . Heart disease Father   . Heart attack Father   . Heart attack Maternal Grandfather   . Heart attack Paternal Grandfather    Allergies  Allergen Reactions  . Sulfa Antibiotics Hives   Prior to Admission medications   Medication Sig Start Date End Date Taking? Authorizing Provider  albuterol (PROAIR HFA) 108 (90 Base) MCG/ACT inhaler Inhale 2 puffs into the lungs every 6 (six) hours as needed for wheezing or shortness of breath.    [provider]  aspirin EC 81 MG tablet Take 81 mg by mouth daily.    [provider]  aspirin-acetaminophen-caffeine (EXCEDRIN MIGRAINE) [phone] MG per tablet Take 1-2 tablets by mouth every 6 (six) hours as needed for headache or migraine.     [provider]  augmented betamethasone dipropionate (DIPROLENE-AF) 0.05 % ointment Apply 1 application topically daily as needed (acrodermatitis [place] on fingers/toes).     [provider]  Calcium Carbonate Antacid (TUMS PO) Take 2 tablets by mouth daily as needed (indigestion).    [provider]  carvedilol (COREG) 12.5 MG tablet Take 1 tablet (12.5 mg total) by mouth 2 (two) times daily. 12/19/15   Lars Masson, MD  furosemide (LASIX) 20 MG tablet Take 1 tablet (20 mg total) by mouth daily as needed for edema. Patient taking differently: Take 20 mg by mouth daily as needed for fluid or edema.  09/07/15   Lars Masson, MD  levothyroxine (SYNTHROID, LEVOTHROID) 112 MCG tablet Take 112 mcg by mouth daily before breakfast.    [provider]  losartan (COZAAR) 50 MG tablet Take 1 tablet (50 mg total) by mouth daily. 07/16/16 10/14/16  Lars Masson, MD  Melatonin 5 MG TABS Take 5 mg by mouth at bedtime as needed (for sleep.).    [provider]  methocarbamol (ROBAXIN) 750 MG tablet Take 1 tablet (750 mg total) by mouth 2 (two) times daily as needed. For leg  spasms. Patient taking differently: Take 750 mg by mouth 2 (two) times daily as needed for muscle spasms (leg spasms).  05/05/16   Costella, Darci Current, PA-C  methylphenidate (RITALIN) 20 MG tablet Take 20 mg by mouth 2 (two) times daily as needed (narcolepsy).  02/22/16   [provider]  Naphazoline-Pheniramine (ALLERGY EYE OP) Place 1-2 drops into both eyes daily as needed (for irritated eyes).    [provider]  ondansetron (ZOFRAN) 4 MG tablet Take 0.5 tablets (2 mg total) by mouth every 8 (eight) hours as needed for nausea or vomiting. 12/19/15   Lars Masson, MD  ondansetron (ZOFRAN-ODT) 4 MG disintegrating tablet Take  4 mg by mouth every 6 (six) hours as needed. 06/11/16   [provider]  Probiotic Product (PROBIOTIC PO) Take 1 capsule by mouth at bedtime.     [provider]   Dg Knee 1-2 Views Right  Result Date: 08/11/2016 CLINICAL DATA:  81 year old female with painful right total knee replacement. Swelling and pain for 2 days. EXAM: RIGHT KNEE - 1-2 VIEW COMPARISON:  None. FINDINGS: Large joint effusion evident on the cross-table lateral view. Total knee arthroplasty hardware appears intact and normally aligned. Patella appears intact. Dystrophic calcifications at the quadriceps tendon. No acute osseous abnormality identified. IMPRESSION: Large joint effusion. No acute osseous abnormality or adverse hardware features identified. Electronically Signed   By: Odessa Fleming M.D.   On: 08/11/2016 13:34    Positive ROS: All other systems have been reviewed and were otherwise negative with the exception of those mentioned in the HPI and as above.  Physical Exam: General: Alert, no acute distress Cardiovascular: No pedal edema Respiratory: No cyanosis, no use of accessory musculature GI: No organomegaly, abdomen is soft and non-tender Skin: No lesions in the area of chief complaint Neurologic: Sensation intact distally Psychiatric: Patient is competent for  consent with normal mood and affect Lymphatic: No axillary or cervical lymphadenopathy  MUSCULOSKELETAL: Examination of the right knee reveals a well-healed anterior midline incision. She has warmth and an effusion. There is no erythema. She is able to perform a straight leg raise without any lag. Flexion is limited secondary to pain. She is neurovascularly intact distally.  I reviewed her previous operative report from 04/12/02. She has a Research officer, political party total knee with size 3 PS femur, size 3 mobile-bearing tibial tray, 12.5 mm RP polyethylene insert, and size 38 patella.  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 I recommended diagnostic aspiration of the right knee. After verbal consent was obtained, the right knee was sterilely prepped and draped. I used 5 mL of 1% plain lidocaine to anesthetize the skin and subcutaneous tissues. Then using aseptic technique, I inserted an 18-gauge needle into the suprapatellar pouch of the right knee. I withdrew the stylet, and then I aspirated 60 mL of purulent, cloudy joint fluid. The fluid was sent to the lab for cell count with differential, crystal ID, Gram stain, and culture. I expect the synovial fluid to indicate infection. Given the acute nature of this infection, we are going to attempt to salvage her prosthesis. I have discussed with the patient and her family the risks, benefits, and alternatives to debridement of the right knee with polyliner exchange. If this procedure fails, then she will require a two-stage revision in the future. She will also need a PICC line postoperatively with infectious disease consult. Recommend hospitalist consult for admission due to recurrent bacteremia. Continue nothing by mouth for now. Hold chemical DVT prophylaxis. Final plans to be made when synovial  fluid analysis results available.  The risks, benefits, and alternatives were discussed with the patient. There are risks associated with the surgery including, but not limited to, problems with anesthesia (death), infection, instability (giving out of the joint), dislocation, differences in leg length/angulation/rotation, fracture of bones, loosening or failure of implants, hematoma (blood accumulation) which may require surgical drainage, blood clots, pulmonary embolism, nerve injury (foot drop), and blood vessel injury. The patient understands these risks and elects to proceed.  ADDENDUM: synovial fluid WBC 23K. Gram stain and diff pending. Indicative of infection. Will proceed with I&D, poly liner exchange.   Alys Dulak, Cloyde Reams, MD Cell [phone]    08/11/2016 1:59 PM

## 2016-08-11 NOTE — Anesthesia Procedure Notes (Signed)
Procedure Name: Intubation Date/Time: 08/11/2016 4:19 PM Performed by: Edmonia Caprio Pre-anesthesia Checklist: Patient identified, Emergency Drugs available, Suction available, Patient being monitored and Timeout performed Patient Re-evaluated:Patient Re-evaluated prior to induction Oxygen Delivery Method: Circle system utilized Preoxygenation: Pre-oxygenation with 100% oxygen Induction Type: IV induction Ventilation: Mask ventilation without difficulty Laryngoscope Size: Miller and 2 Grade View: Grade I Tube type: Oral Tube size: 7.0 mm Number of attempts: 1 Placement Confirmation: ETT inserted through vocal cords under direct vision,  positive ETCO2 and breath sounds checked- equal and bilateral Secured at: 22 cm Tube secured with: Tape Dental Injury: Teeth and Oropharynx as per pre-operative assessment

## 2016-08-11 NOTE — Progress Notes (Signed)
Dr. Linna Caprice called and updated on post op stay. Made aware of Dr. Gerre Pebbles recommendation for post op cardiac monitoring . Order to be written by nurse

## 2016-08-11 NOTE — ED Provider Notes (Signed)
[facility]-EMERGENCY DEPT Provider Note   CSN: [id number] Arrival date & time: 08/11/16  1036     History   Chief Complaint Chief Complaint  Patient presents with  . Knee Pain    HPI [name] [name] is a 81 y.o. female who presents emergency Department with chief complaint of right knee pain. The patient has a recent history of admission for unspecified bacteremia. Blood cultures did not grow out any organisms. [facility] She had several parenteral antibiotics and was followed by infectious disease. She has been doing well up until yesterday when she had sudden onset of his sheet, swelling and severe pain in her right knee. She has near inability to bear weight on the leg and cannot move the joint. She was afraid that she might have an infection in her knee. She has not had any fevers, chills, chest pains, or other complaints.  HPI  Past Medical History:  Diagnosis Date  . 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 AICD (automatic cardioverter/defibrillator) present   . Anemia    hx  . Angina effort (HCC)   . Arthritis    "finger joints" (06/21/2016)  . Asthma   . CHF (congestive heart failure) (HCC)   . Congestive dilated cardiomyopathy (HCC) 03/31/2014  . Daily headache    "in the last week" (615/2018)  . GERD (gastroesophageal reflux disease)   . Heart murmur   . History of blood transfusion 1990s   "related to hysterectomy"  . Hypothyroidism   . LBBB (left bundle branch block) 10/30/2013  . Left bundle branch block   . MSSA bacteremia 06/21/2016  . Myocardial infarction (HCC)    "I've been told I've had some; I've never felt them" (06/21/2016)  . Pancreatitis, acute   . Pneumonia 06/2016  . Presence of permanent cardiac pacemaker   . Thyroid disease     Patient Active Problem List   Diagnosis Date Noted  . Right foot pain 07/11/2016  . MSSA  bacteremia 06/21/2016  . Pleuritic chest pain 06/21/2016  . S/P lumbar fusion 05/03/2016  . Nonischemic cardiomyopathy (HCC) 02/08/2015  . Biventricular ICD (implantable cardioverter-defibrillator) in place 02/08/2015  . Chest pain 07/17/2014  . Back pain 07/17/2014  . Hypothyroidism 07/17/2014  . Prinzmetal variant angina (HCC) 07/17/2014  . Drug-induced nausea and vomiting 07/17/2014  . Chronic systolic CHF (congestive heart failure), NYHA class 3 (HCC) 07/04/2014  . Presyncope 04/11/2014  . Ventricular tachycardia-nonsustained  04/11/2014  . Congestive dilated cardiomyopathy (HCC) 03/31/2014  . Heart palpitations 03/31/2014  . Family history of premature CAD 10/30/2013  . LBBB (left bundle branch block) 10/30/2013    Past Surgical History:  Procedure Laterality Date  . ABDOMINAL HYSTERECTOMY    . APPENDECTOMY    . CARDIAC CATHETERIZATION    . CARDIAC DEFIBRILLATOR PLACEMENT  ?2016  . CATARACT EXTRACTION W/ INTRAOCULAR LENS  IMPLANT, BILATERAL Bilateral   . CHOLECYSTECTOMY OPEN     "related to pancreatitis"  . DILATION AND CURETTAGE OF UTERUS    . EP IMPLANTABLE DEVICE N/A 07/04/2014   Procedure: BiV ICD Insertion CRT-D;  Surgeon: Duke Salvia, MD;  Location: [facility] INVASIVE CV LAB;  Service: Cardiovascular;  Laterality: N/A;  . HERNIA REPAIR    . INSERT / REPLACE / REMOVE PACEMAKER  ?2016  . JOINT REPLACEMENT Bilateral    "thumb joints; used my own  material"  . JOINT REPLACEMENT    . LEFT HEART CATHETERIZATION WITH CORONARY ANGIOGRAM N/A 11/02/2013   Procedure: LEFT HEART CATHETERIZATION WITH CORONARY ANGIOGRAM;  Surgeon: Corky Crafts, MD;  Location: [facility] CATH LAB;  Service: Cardiovascular;  Laterality: N/A;  . LOOP RECORDER IMPLANT N/A 04/13/2014   Procedure: LOOP RECORDER IMPLANT;  Surgeon: Duke Salvia, MD;  Location: [facility] CATH LAB;  Service: Cardiovascular;  Laterality: N/A;  . LOOP RECORDER REMOVAL  ?2016  . POSTERIOR LUMBAR FUSION  05/03/2016   Lumbar Two-Three,  Lumbar Three-Four Posterior Lumbar Fusion withLaminectomy and Foraminotomy  . SPINE SURGERY  04/2016   lumbar   . TONSILLECTOMY    . TOTAL HIP ARTHROPLASTY Bilateral   . UMBILICAL HERNIA REPAIR  1970s?    OB History    No data available       Home Medications    Prior to Admission medications   Medication Sig Start Date End Date Taking? Authorizing Provider  albuterol (PROAIR HFA) 108 (90 Base) MCG/ACT inhaler Inhale 2 puffs into the lungs every 6 (six) hours as needed for wheezing or shortness of breath.    [provider]  aspirin EC 81 MG tablet Take 81 mg by mouth daily.    [provider]  aspirin-acetaminophen-caffeine (EXCEDRIN MIGRAINE) [phone] MG per tablet Take 1-2 tablets by mouth every 6 (six) hours as needed for headache or migraine.     [provider]  augmented betamethasone dipropionate (DIPROLENE-AF) 0.05 % ointment Apply 1 application topically daily as needed (acrodermatitis [place] on fingers/toes).     [provider]  Calcium Carbonate Antacid (TUMS PO) Take 2 tablets by mouth daily as needed (indigestion).    [provider]  carvedilol (COREG) 12.5 MG tablet Take 1 tablet (12.5 mg total) by mouth 2 (two) times daily. 12/19/15   Lars Masson, MD  furosemide (LASIX) 20 MG tablet Take 1 tablet (20 mg total) by mouth daily as needed for edema. Patient taking differently: Take 20 mg by mouth daily as needed for fluid or edema.  09/07/15   Lars Masson, MD  levothyroxine (SYNTHROID, LEVOTHROID) 112 MCG tablet Take 112 mcg by mouth daily before breakfast.    [provider]  losartan (COZAAR) 50 MG tablet Take 1 tablet (50 mg total) by mouth daily. 07/16/16 10/14/16  Lars Masson, MD  Melatonin 5 MG TABS Take 5 mg by mouth at bedtime as needed (for sleep.).    [provider]  methocarbamol (ROBAXIN) 750 MG tablet Take 1 tablet (750 mg total) by mouth 2 (two) times daily as needed. For  leg spasms. Patient taking differently: Take 750 mg by mouth 2 (two) times daily as needed for muscle spasms (leg spasms).  05/05/16   Costella, Darci Current, PA-C  methylphenidate (RITALIN) 20 MG tablet Take 20 mg by mouth 2 (two) times daily as needed (narcolepsy).  02/22/16   [provider]  Naphazoline-Pheniramine (ALLERGY EYE OP) Place 1-2 drops into both eyes daily as needed (for irritated eyes).    [provider]  ondansetron (ZOFRAN) 4 MG tablet Take 0.5 tablets (2 mg total) by mouth every 8 (eight) hours as needed for nausea or vomiting. 12/19/15   Lars Masson, MD  ondansetron (ZOFRAN-ODT) 4 MG disintegrating tablet Take 4 mg by mouth every 6 (six) hours as needed. 06/11/16   [provider]  Probiotic Product (PROBIOTIC PO) Take 1 capsule by mouth at bedtime.     [provider]  Family History Family History  Problem Relation Age of Onset  . Hyperlipidemia Mother   . Arrhythmia Mother        palpitations  . Heart disease Father   . Heart attack Father   . Heart attack Maternal Grandfather   . Heart attack Paternal Grandfather     Social History Social History  Substance Use Topics  . Smoking status: Never Smoker  . Smokeless tobacco: Never Used     Comment: "smoked as a teenager; for maybe 1 wk"  . Alcohol use Yes     Comment: 6/15//2018 "glass of wine q 6 months or so"     Allergies   Sulfa antibiotics   Review of Systems Review of Systems Ten systems reviewed and are negative for acute change, except as noted in the HPI.    Physical Exam Updated Vital Signs BP (!) 108/92   Pulse 98   Temp 98.6 F (37 C) (Oral)   Resp 18   Ht 4\' 10"  (1.473 m)   Wt 67.1 kg (148 lb)   SpO2 100%   BMI 30.93 kg/m   Physical Exam  Constitutional: She is oriented to person, place, and time. She appears well-developed and well-nourished. No distress.  HENT:  Head: Normocephalic and atraumatic.  Eyes: Conjunctivae are normal. No  scleral icterus.  Neck: Normal range of motion.  Cardiovascular: Normal rate, regular rhythm and normal heart sounds.  Exam reveals no gallop and no friction rub.   No murmur heard. Pulmonary/Chest: Effort normal and breath sounds normal. No respiratory distress.  Abdominal: Soft. Bowel sounds are normal. She exhibits no distension and no mass. There is no tenderness. There is no guarding.  Musculoskeletal:  Right knee hot, tender to palpation and pain with even minimal range of motion, large effusion present.  Neurological: She is alert and oriented to person, place, and time.  Skin: Skin is warm and dry. She is not diaphoretic.  Psychiatric: Her behavior is normal.  Nursing note and vitals reviewed.    ED Treatments / Results  Labs (all labs ordered are listed, but only abnormal results are displayed) Labs Reviewed  COMPREHENSIVE METABOLIC PANEL - Abnormal; Notable for the following:       Result Value   Glucose, Bld 121 (*)    Creatinine, Ser 1.01 (*)    Albumin 3.3 (*)    ALT 8 (*)    GFR calc non Af Amer 52 (*)    All other components within normal limits  CBC WITH DIFFERENTIAL/PLATELET - Abnormal; Notable for the following:    RBC 3.73 (*)    Hemoglobin 11.3 (*)    HCT 34.7 (*)    All other components within normal limits  GRAM STAIN  BODY FLUID CULTURE  ANAEROBIC CULTURE  CULTURE, BLOOD (ROUTINE X 2)  CULTURE, BLOOD (ROUTINE X 2)  PROTIME-INR  SEDIMENTATION RATE  C-REACTIVE PROTEIN  SYNOVIAL CELL COUNT + DIFF, W/ CRYSTALS  URINALYSIS, ROUTINE W REFLEX MICROSCOPIC  I-STAT CG4 LACTIC ACID, ED  I-STAT CG4 LACTIC ACID, ED  I-STAT CG4 LACTIC ACID, ED    EKG  EKG Interpretation None       Radiology Dg Knee 1-2 Views Right  Result Date: 08/11/2016 CLINICAL DATA:  81 year old female with painful right total knee replacement. Swelling and pain for 2 days. EXAM: RIGHT KNEE - 1-2 VIEW COMPARISON:  None. FINDINGS: Large joint effusion evident on the cross-table  lateral view. Total knee arthroplasty hardware appears intact and normally aligned. Patella appears intact.  Dystrophic calcifications at the quadriceps tendon. No acute osseous abnormality identified. IMPRESSION: Large joint effusion. No acute osseous abnormality or adverse hardware features identified. Electronically Signed   By: Odessa Fleming M.D.   On: 08/11/2016 13:34    Procedures Procedures (including critical care time)  Medications Ordered in ED Medications - No data to display   Initial Impression / Assessment and Plan / ED Course  I have reviewed the triage vital signs and the nursing notes.  Pertinent labs & imaging results that were available during my care of the patient were reviewed by me and considered in my medical decision making (see chart for details).     Patient seen in the ER by Dr. Linna Caprice who performed arthrocentesis of the knee. Patient had grossly purulent fluid on arthrocentesis and will need a washout. Patient also needs medicine admission given her unknown source of sepsis. She is off the floor at this time for admission. Stable through her visit here in the emergency department.  Final Clinical Impressions(s) / ED Diagnoses   Final diagnoses:  Painful total knee replacement, right [facility])    New Prescriptions New Prescriptions   No medications on file     Arthor Captain, PA-C 08/11/16 1507    Arthor Captain, PA-C 08/11/16 1507    Mancel Bale, MD 08/11/16 [phone]

## 2016-08-11 NOTE — ED Provider Notes (Signed)
  Face-to-face evaluation   History: She presents for evaluation of painful swelling in the right knee, with a hot feeling for several days, without trauma. She denies fever, chills, weakness or dizziness. She completed a course of antibiotics for septicemia, 2 weeks ago.  Physical exam: Alert, elderly female in mild discomfort. Right knee, tender with slight swelling, but no significant overlying erythema.  Medical screening examination/treatment/procedure(s) were conducted as a shared visit with non-physician practitioner(s) and myself.  I personally evaluated the patient during the encounter    Mancel Bale, MD 08/11/16 1527

## 2016-08-12 ENCOUNTER — Encounter (HOSPITAL_COMMUNITY): Payer: Self-pay | Admitting: Orthopedic Surgery

## 2016-08-12 DIAGNOSIS — M0009 Staphylococcal polyarthritis: Secondary | ICD-10-CM

## 2016-08-12 DIAGNOSIS — A498 Other bacterial infections of unspecified site: Secondary | ICD-10-CM

## 2016-08-12 DIAGNOSIS — M01X9 Direct infection of multiple joints in infectious and parasitic diseases classified elsewhere: Secondary | ICD-10-CM

## 2016-08-12 DIAGNOSIS — T827XXA Infection and inflammatory reaction due to other cardiac and vascular devices, implants and grafts, initial encounter: Secondary | ICD-10-CM | POA: Diagnosis present

## 2016-08-12 DIAGNOSIS — E032 Hypothyroidism due to medicaments and other exogenous substances: Secondary | ICD-10-CM

## 2016-08-12 LAB — BASIC METABOLIC PANEL
ANION GAP: 8 (ref 5–15)
BUN: 23 mg/dL — ABNORMAL HIGH (ref 6–20)
CALCIUM: 8.5 mg/dL — AB (ref 8.9–10.3)
CO2: 22 mmol/L (ref 22–32)
CREATININE: 1.42 mg/dL — AB (ref 0.44–1.00)
Chloride: 104 mmol/L (ref 101–111)
GFR, EST AFRICAN AMERICAN: 40 mL/min — AB (ref >60)
GFR, EST NON AFRICAN AMERICAN: 34 mL/min — AB (ref >60)
Glucose, Bld: 186 mg/dL — ABNORMAL HIGH (ref 65–99)
Potassium: 3.8 mmol/L (ref 3.5–5.1)
SODIUM: 134 mmol/L — AB (ref 135–145)

## 2016-08-12 LAB — PATHOLOGIST SMEAR REVIEW

## 2016-08-12 LAB — CBC
HEMATOCRIT: 27.5 % — AB (ref 36.0–46.0)
Hemoglobin: 8.8 g/dL — ABNORMAL LOW (ref 12.0–15.0)
MCH: 29.5 pg (ref 26.0–34.0)
MCHC: 32 g/dL (ref 30.0–36.0)
MCV: 92.3 fL (ref 78.0–100.0)
Platelets: 157 10*3/uL (ref 150–400)
RBC: 2.98 MIL/uL — ABNORMAL LOW (ref 3.87–5.11)
RDW: 14.2 % (ref 11.5–15.5)
WBC: 7.9 10*3/uL (ref 4.0–10.5)

## 2016-08-12 LAB — TSH: TSH: 0.995 u[IU]/mL (ref 0.350–4.500)

## 2016-08-12 MED ORDER — CEFAZOLIN SODIUM-DEXTROSE 2-4 GM/100ML-% IV SOLN
2.0000 g | Freq: Two times a day (BID) | INTRAVENOUS | Status: DC
  Administered 2016-08-12 – 2016-08-14 (×4): 2 g via INTRAVENOUS
  Filled 2016-08-12 (×4): qty 100

## 2016-08-12 MED ORDER — ENOXAPARIN SODIUM 30 MG/0.3ML ~~LOC~~ SOLN
30.0000 mg | SUBCUTANEOUS | Status: DC
  Administered 2016-08-13 – 2016-08-14 (×2): 30 mg via SUBCUTANEOUS
  Filled 2016-08-12 (×2): qty 0.3

## 2016-08-12 MED ORDER — ENSURE ENLIVE PO LIQD
237.0000 mL | Freq: Two times a day (BID) | ORAL | Status: DC
  Administered 2016-08-13 (×2): 237 mL via ORAL

## 2016-08-12 NOTE — Progress Notes (Signed)
At patient's bedside to discuss PICC.  Patient states that Infectious Disease came in for a consult today.  VA[facility] will place PICC when approved by ID.   Thank you,  Gasper Lloyd, RN VAST

## 2016-08-12 NOTE — Progress Notes (Addendum)
Initial Nutrition Assessment  DOCUMENTATION CODES:   Obesity unspecified  INTERVENTION:  Continue Ensure Enlive po BID, each supplement provides 350 kcal and 20 grams of protein.  NUTRITION DIAGNOSIS:   Increased nutrient needs related to wound healing as evidenced by estimated needs.  GOAL:   Patient will meet greater than or equal to 90% of their needs  MONITOR:   PO intake, Supplement acceptance, Labs, Weight trends, Skin, I & O's  REASON FOR ASSESSMENT:   Malnutrition Screening Tool    ASSESSMENT:   82 y.o. female with medical history significant of congestive dilated cardiomyopathy; chronic systolic CHF; hypothyroidism; AICD/pacer in place; s/p lumbar fusion in 4/18; and admission from 6/7-18 with MSSA bacteremia of uncertain etiology.  She "felt great" upon completing her antibiotics on 7/5.  She had a bilateral TKR 14 years ago and on Thursday (8/2) noticed R knee stiffness/pain.  On Friday (8/3) it was swollen and hot.  Procedure (8/5): IRRIGATION AND DEBRIDEMENT KNEE WITH POLY EXCHANGE (Right)  Pt was unavailable during time of visit. Meal completion has been 100%. Pt currently has Ensure ordered. RD to continue with current orders to aid in wound healing. Per weight records, pt with a 10% weight loss in 4 months. Noted pt with history of CHF, weight loss may be fluid related. Unable to complete Nutrition-Focused physical exam at this time. RD to perform physical exam at next visit.   Labs and medications reviewed.   Diet Order:  Diet regular Room service appropriate? Yes; Fluid consistency: Thin  Skin:  Wound (see comment) (Incision R knee with VAC)  Last BM:  8/3  Height:   Ht Readings from Last 1 Encounters:  08/11/16 4\' 10"  (1.473 m)    Weight:   Wt Readings from Last 1 Encounters:  08/11/16 150 lb (68 kg)    Ideal Body Weight:  44 kg  BMI:  Body mass index is 31.35 kg/m.  Estimated Nutritional Needs:   Kcal:  1700-1800  Protein:  70-85  grams  Fluid:  1.7 - 1.8 L/day  EDUCATION NEEDS:   No education needs identified at this time  [name] Smiling, MS, RD, LDN Pager # [phone] After hours/ weekend pager # [phone]

## 2016-08-12 NOTE — Evaluation (Signed)
Physical Therapy Evaluation Patient Details Name: [name] [name] MRN: [id number] DOB: [birth year] Today's Date: 08/12/2016   History of Present Illness  Pt is a 81 yo female admitted from [facility] hospital on 06/21/16. Pt was admitted to [facility] on 06/13/16 with a fever, nausea and vomiting and generalized weakness. Pt developed sepsis, hypotension and mild AKI and was diagnosed with MSSA bacteremia. Source is still in question, now considering leads from implaned defibrillator. PMH significant for lumbar surgery april, CHF, cardiomyopathy, hypothyroidism, biventricular ICD.  [name] Pt s/p I and D of R knee and poly exchange.  Clinical Impression  Pt functioning well with report "this feels so much better" Pt with good home set up and support. ANticipate pt to progress well and be safe for d/c home once medically stable. Acute PT to follow.    Follow Up Recommendations Outpatient PT    Equipment Recommendations  None recommended by PT    Recommendations for Other Services       Precautions / Restrictions Precautions Precautions: Fall Restrictions Weight Bearing Restrictions: Yes RLE Weight Bearing: Weight bearing as tolerated      Mobility  Bed Mobility Overal bed mobility: Needs Assistance Bed Mobility: Supine to Sit     Supine to sit: Supervision     General bed mobility comments: Able to perform bed mobiltiy with no physical A  Transfers Overall transfer level: Needs assistance Equipment used: 1 person hand held assist;Rolling walker (2 wheeled) Transfers: Sit to/from Stand Sit to Stand: Min guard         General transfer comment: increased time, good hand placement  Ambulation/Gait Ambulation/Gait assistance: Min guard Ambulation Distance (Feet): 150 Feet Assistive device: Rolling walker (2 wheeled) Gait Pattern/deviations: Step-through pattern Gait velocity: decreased Gait velocity interpretation: Below normal speed for age/gender General Gait Details: pt  with step through gait pattern, mild antalgia with report "it feels so much better now that I had the surgery"  Stairs            Wheelchair Mobility    Modified Rankin (Stroke Patients Only)       Balance Overall balance assessment: Needs assistance Sitting-balance support: No upper extremity supported;Feet supported Sitting balance-Leahy Scale: Good     Standing balance support: During functional activity;Single extremity supported Standing balance-Leahy Scale: Fair                               Pertinent Vitals/Pain Pain Assessment: 0-10 Pain Score: 5  Pain Location: R knee; R goin Pain Descriptors / Indicators: Constant;Discomfort;Grimacing;Guarding Pain Intervention(s): Monitored during session    Home Living Family/patient expects to be discharged to:: Private residence Living Arrangements: Spouse/significant other Available Help at Discharge: Family;Available 24 hours/day Type of Home: House Home Access: Stairs to enter   [employer] of Steps: 1 little step Home Layout: Multi-level;Able to live on main level with bedroom/bathroom Home Equipment: Shower seat;Walker - 2 wheels;Tub bench;Grab bars - tub/shower;Grab bars - toilet;Hand held shower head;Adaptive equipment;Toilet riser      Prior Function Level of Independence: Independent with assistive device(s)         Comments: Handling her own PIC line and performing ADLs since last admittion     Hand Dominance   Dominant Hand: Right    Extremity/Trunk Assessment   Upper Extremity Assessment Upper Extremity Assessment: Defer to OT evaluation    Lower Extremity Assessment Lower Extremity Assessment: RLE deficits/detail RLE Deficits / Details: able to initiate  quad set    Cervical / Trunk Assessment Cervical / Trunk Assessment: Kyphotic  Communication   Communication: No difficulties  Cognition Arousal/Alertness: Awake/alert Behavior During Therapy: WFL for tasks  assessed/performed Overall Cognitive Status: Within Functional Limits for tasks assessed                                        General Comments General comments (skin integrity, edema, etc.): ace wrap in place, drain in place    Exercises Total Joint Exercises Quad Sets: AROM;Right;10 reps;Supine Heel Slides: AAROM;Right;10 reps;Supine   Assessment/Plan    PT Assessment Patient needs continued PT services  PT Problem List Decreased strength;Decreased range of motion;Decreased balance;Decreased mobility;Decreased activity tolerance;Decreased cognition;Decreased coordination;Decreased knowledge of use of DME       PT Treatment Interventions DME instruction;Gait training;Stair training;Functional mobility training;Therapeutic activities;Therapeutic exercise    PT Goals (Current goals can be found in the Care Plan section)  Acute Rehab PT Goals Patient Stated Goal: Go home PT Goal Formulation: With patient Time For Goal Achievement: 08/26/16 Potential to Achieve Goals: Good    Frequency 7X/week   Barriers to discharge        Co-evaluation               AM-PAC PT "6 Clicks" Daily Activity  Outcome Measure Difficulty turning over in bed (including adjusting bedclothes, sheets and blankets)?: A Little Difficulty moving from lying on back to sitting on the side of the bed? : A Little Difficulty sitting down on and standing up from a chair with arms (e.g., wheelchair, bedside commode, etc,.)?: A Little Help needed moving to and from a bed to chair (including a wheelchair)?: A Little Help needed walking in hospital room?: A Little Help needed climbing 3-5 steps with a railing? : A Little 6 Click Score: 18    End of Session Equipment Utilized During Treatment: Gait belt Activity Tolerance: Patient tolerated treatment well Patient left: in chair;with call bell/phone within reach;with restraints reapplied Nurse Communication: Mobility status PT Visit  Diagnosis: Unsteadiness on feet (R26.81);Difficulty in walking, not elsewhere classified (R26.2)    Time: 0300-9233 PT Time Calculation (min) (ACUTE ONLY): 23 min   Charges:   PT Evaluation $PT Eval Moderate Complexity: 1 Mod PT Treatments $Gait Training: 8-22 mins   PT G Codes:        Lewis Shock, PT, DPT Pager #: [phone] Office #: [phone]   Dang Mathison M Baili Stang 08/12/2016, 3:22 PM

## 2016-08-12 NOTE — Progress Notes (Signed)
Subjective:  Patient reports pain as mild to moderate.  No c/o. Patient sates that ID saw her today and rec removal of AICD  Objective:   VITALS:   Vitals:   08/11/16 1945 08/11/16 2100 08/12/16 0420 08/12/16 1500  BP: (!) 130/95 (!) 112/48 (!) 113/48 (!) 108/50  Pulse:  83 67 82  Resp:  18 18 18   Temp: 97.7 F (36.5 C) 97.7 F (36.5 C) 97.6 F (36.4 C) 97.9 F (36.6 C)  TempSrc:  Oral Oral Oral  SpO2:  100% 100% 100%  Weight:  68 kg (150 lb)    Height:  4\' 10"  (1.473 m)      NAD ABD soft Sensation intact distally Intact pulses distally Dorsiflexion/Plantar flexion intact Incision: dressing C/D/I Compartment soft Wound VAC intact HV ss  L knee: no erythema, swelling, or effusion. NO evidence of infection.   Lab Results  Component Value Date   WBC 7.9 08/12/2016   HGB 8.8 (L) 08/12/2016   HCT 27.5 (L) 08/12/2016   MCV 92.3 08/12/2016   PLT 157 08/12/2016   BMET    Component Value Date/Time   NA 134 (L) 08/12/2016 0343   K 3.8 08/12/2016 0343   CL 104 08/12/2016 0343   CO2 22 08/12/2016 0343   GLUCOSE 186 (H) 08/12/2016 0343   BUN 23 (H) 08/12/2016 0343   CREATININE 1.42 (H) 08/12/2016 0343   CREATININE 1.11 (H) 12/07/2015 0934   CALCIUM 8.5 (L) 08/12/2016 0343   GFRNONAA 34 (L) 08/12/2016 0343   GFRAA 40 (L) 08/12/2016 0343     Recent Results (from the past 240 hour(s))  Culture, body fluid-bottle     Status: None (Preliminary result)   Collection Time: 08/11/16  1:46 PM  Result Value Ref Range Status   Specimen Description FLUID SYNOVIAL RIGHT KNEE  Final   Special Requests NONE  Final   Gram Stain   Final    GRAM POSITIVE COCCI IN CLUSTERS IN BOTH AEROBIC AND ANAEROBIC BOTTLES    Culture GRAM POSITIVE COCCI  Final   Report Status PENDING  Incomplete  Gram stain     Status: None   Collection Time: 08/11/16  1:46 PM  Result Value Ref Range Status   Specimen Description FLUID SYNOVIAL RIGHT KNEE  Final   Special Requests NONE  Final   Gram Stain   Final    ABUNDANT WBC PRESENT, PREDOMINANTLY PMN RARE INTRACELLULAR GRAM POSITIVE COCCI IN PAIRS    Report Status 08/11/2016 FINAL  Final  Blood Culture (routine x 2)     Status: None (Preliminary result)   Collection Time: 08/11/16  1:57 PM  Result Value Ref Range Status   Specimen Description BLOOD RIGHT ANTECUBITAL  Final   Special Requests IN PEDIATRIC BOTTLE Blood Culture adequate volume  Final   Culture NO GROWTH < 24 HOURS  Final   Report Status PENDING  Incomplete  Blood Culture (routine x 2)     Status: None (Preliminary result)   Collection Time: 08/11/16  2:00 PM  Result Value Ref Range Status   Specimen Description BLOOD LEFT ANTECUBITAL  Final   Special Requests   Final    BOTTLES DRAWN AEROBIC AND ANAEROBIC Blood Culture adequate volume   Culture NO GROWTH < 24 HOURS  Final   Report Status PENDING  Incomplete  Aerobic/Anaerobic Culture (surgical/deep wound)     Status: None (Preliminary result)   Collection Time: 08/11/16  4:55 PM  Result Value Ref Range Status   Specimen  Description KNEE RIGHT TISSUE  Final   Special Requests SPEC A  Final   Gram Stain   Final    FEW WBC PRESENT, PREDOMINANTLY PMN RARE GRAM POSITIVE COCCI IN PAIRS Gram Stain Report Called to,Read Back By and Verified With: A COLLINS,RN AT 1802 08/11/16 BY L BENFIELD    Culture   Final    FEW STAPHYLOCOCCUS AUREUS SUSCEPTIBILITIES TO FOLLOW    Report Status PENDING  Incomplete     Assessment/Plan: 1 Day Post-Op   Principal Problem:   Septic arthritis (HCC) Active Problems:   Chronic systolic CHF (congestive heart failure), NYHA class 3 (HCC)   Hypothyroidism   Biventricular ICD (implantable cardioverter-defibrillator) in place   S/P lumbar fusion   MSSA bacteremia   Infection of total right knee replacement (HCC)   WBAT with walker Abx per ID DVT ppx: lovenox, SCDs, tEDs PO pain control Follow cultures: likely MSSA Dispo: will d/c HV drain tomorrow, will remove iVAC  prior to d/c, needs long term IV abx   [name] [name], [name] [name] 08/12/2016, 5:31 PM   Samson Frederic, MD Cell [phone]

## 2016-08-12 NOTE — Progress Notes (Signed)
[facility] PROGRESS NOTE  [name] [name] ZOX:[id number] DOB: November 08, 1935 DOA: 08/11/2016   PCP: [name], MD     Assessment/Plan: Principal Problem:   Septic arthritis [facility]) Active Problems:   Chronic systolic CHF (congestive heart failure), NYHA class 3 (HCC)   Hypothyroidism   Biventricular ICD (implantable cardioverter-defibrillator) in place   S/P lumbar fusion   MSSA bacteremia   Infection of total right knee replacement (HCC)    81 y.o. female with medical history significant of congestive dilated cardiomyopathy; chronic systolic CHF; hypothyroidism; AICD/pacer in place; s/p lumbar fusion in 4/18; and admission from 6/7-18 with MSSA bacteremia of uncertain etiology.  She "felt great" upon completing her antibiotics on 7/5.  She had a bilateral TKR 14 years ago and on Thursday (8/2) noticed R knee stiffness/pain.  On Friday (8/3) it was swollen and hot. Recent TTE and TEE were negative. Pacemaker site looked clean and dry.  Assessment and plan Septic arthritis in very remote TKR in a patient with recent admission for MSSA bacteremia -She tested positive for Staph aureus (MSSA) in 6/16 and 4/18 -Blood cultures here were negative during her last admission and records are not currently available from [facility] -Patient was admitted for a prolonged hospitalization both at [facility] and at [facility]  -ID was consulted; Dr. Luciana Axe last saw the patient on 6/1 and recommended 4 weeks of IV Ancef through 7/5 and f/u blood cultures thereafter Previous TTE/TEE and LP but no source of identified Requests infectious disease for further recommendations, antibiotics per ID May need cardiology consultation for repeat TEE Patient is status post debridement of the right knee  and received a polyliner exchange     CHF -h/o dilated cardiomyopathy, currently appears to be compensated  -Limited Echo in 10/17 showed grade 1 diastolic dysfunction -Echo in 8/11 showed improved EF  from 35-40% to 50-55%  Hypothyroidism -Check TSH -Continue Synthroid at current dose for now      DVT prophylaxsis Lovenox   CODE STATUS DO NOT RESUSCITATE   Family Communication: Discussed in detail with the patient, all imaging results, lab results explained to the patient   Disposition Plan:   Per ID      Consultants:  Infectious disease    Procedures:   None  Antibiotics: Anti-infectives    Start     Dose/Rate Route Frequency Ordered Stop   08/12/16 0100  ceFAZolin (ANCEF) IVPB 2g/100 mL premix     2 g 200 mL/hr over 30 Minutes Intravenous Every 8 hours 08/11/16 2100 08/19/16 0059   08/11/16 2200  rifampin (RIFADIN) capsule 300 mg     300 mg Oral Every 12 hours 08/11/16 2100     08/11/16 1737  polymyxin B 500,000 Units, bacitracin 50,000 Units in sodium chloride 0.9 % 500 mL irrigation  Status:  Discontinued       As needed 08/11/16 1737 08/11/16 1822   08/11/16 1556  ceFAZolin (ANCEF) 2-4 GM/100ML-% IVPB    Comments:  Joneen Caraway   : cabinet override      08/11/16 1556 08/12/16 0359         HPI/Subjective: Feels much better, right knee not as painful after surgery  Objective: Vitals:   08/11/16 1915 08/11/16 1945 08/11/16 2100 08/12/16 0420  BP:  (!) 130/95 (!) 112/48 (!) 113/48  Pulse:   83 67  Resp:   18 18  Temp:  97.7 F (36.5 C) 97.7 F (36.5 C) 97.6 F (36.4 C)  TempSrc:  Oral Oral  SpO2: 97%  100% 100%  Weight:   68 kg (150 lb)   Height:   4\' 10"  (1.473 m)     Intake/Output Summary (Last 24 hours) at 08/12/16 1212 Last data filed at 08/12/16 0900  Gross per 24 hour  Intake           1697.5 ml  Output              510 ml  Net           1187.5 ml    Exam:  Examination:  General exam: Appears calm and comfortable  Respiratory system: Clear to auscultation. Respiratory effort normal. Cardiovascular system: S1 & S2 heard, RRR. No JVD, murmurs, rubs, gallops or clicks. No pedal edema. Gastrointestinal system: Abdomen is  nondistended, soft and nontender. No organomegaly or masses felt. Normal bowel sounds heard. Central nervous system: Alert and oriented. No focal neurological deficits. Extremities: Symmetric 5 x 5 power. Skin: No rashes, lesions or ulcers Psychiatry: Judgement and insight appear normal. Mood & affect appropriate.     Data Reviewed: I have personally reviewed following labs and imaging studies  Micro Results Recent Results (from the past 240 hour(s))  Culture, body fluid-bottle     Status: None (Preliminary result)   Collection Time: 08/11/16  1:46 PM  Result Value Ref Range Status   Specimen Description FLUID SYNOVIAL RIGHT KNEE  Final   Special Requests NONE  Final   Gram Stain   Final    GRAM POSITIVE COCCI IN CLUSTERS IN BOTH AEROBIC AND ANAEROBIC BOTTLES    Culture GRAM POSITIVE COCCI  Final   Report Status PENDING  Incomplete  Gram stain     Status: None   Collection Time: 08/11/16  1:46 PM  Result Value Ref Range Status   Specimen Description FLUID SYNOVIAL RIGHT KNEE  Final   Special Requests NONE  Final   Gram Stain   Final    ABUNDANT WBC PRESENT, PREDOMINANTLY PMN RARE INTRACELLULAR GRAM POSITIVE COCCI IN PAIRS    Report Status 08/11/2016 FINAL  Final  Blood Culture (routine x 2)     Status: None (Preliminary result)   Collection Time: 08/11/16  1:57 PM  Result Value Ref Range Status   Specimen Description BLOOD RIGHT ANTECUBITAL  Final   Special Requests IN PEDIATRIC BOTTLE Blood Culture adequate volume  Final   Culture NO GROWTH < 24 HOURS  Final   Report Status PENDING  Incomplete  Blood Culture (routine x 2)     Status: None (Preliminary result)   Collection Time: 08/11/16  2:00 PM  Result Value Ref Range Status   Specimen Description BLOOD LEFT ANTECUBITAL  Final   Special Requests   Final    BOTTLES DRAWN AEROBIC AND ANAEROBIC Blood Culture adequate volume   Culture NO GROWTH < 24 HOURS  Final   Report Status PENDING  Incomplete  Aerobic/Anaerobic  Culture (surgical/deep wound)     Status: None (Preliminary result)   Collection Time: 08/11/16  4:55 PM  Result Value Ref Range Status   Specimen Description KNEE RIGHT TISSUE  Final   Special Requests SPEC A  Final   Gram Stain   Final    FEW WBC PRESENT, PREDOMINANTLY PMN RARE GRAM POSITIVE COCCI IN PAIRS Gram Stain Report Called to,Read Back By and Verified With: A COLLINS,RN AT 1802 08/11/16 BY L BENFIELD    Culture FEW UNIDENTIFIED ORGANISM  Final   Report Status PENDING  Incomplete  Radiology Reports Dg Knee 1-2 Views Right  Result Date: 08/11/2016 CLINICAL DATA:  81 year old female with painful right total knee replacement. Swelling and pain for 2 days. EXAM: RIGHT KNEE - 1-2 VIEW COMPARISON:  None. FINDINGS: Large joint effusion evident on the cross-table lateral view. Total knee arthroplasty hardware appears intact and normally aligned. Patella appears intact. Dystrophic calcifications at the quadriceps tendon. No acute osseous abnormality identified. IMPRESSION: Large joint effusion. No acute osseous abnormality or adverse hardware features identified. Electronically Signed   By: Odessa Fleming M.D.   On: 08/11/2016 13:34   Dg Knee Right Port  Result Date: 08/11/2016 CLINICAL DATA:  S/P IRRIGATION AND DEBRIDEMENT KNEE WITH POLY EXCHANGE (Right Leg Upper) EXAM: PORTABLE RIGHT KNEE - 1-2 VIEW COMPARISON:  08/11/2016 FINDINGS: Status post right knee arthroplasty. Locules of gas are identified in the joint space and soft tissues. A surgical drain is identified in the joint space. There is no acute fracture. IMPRESSION: Soft tissue gas and surgical drain following urination. Electronically Signed   By: Norva Pavlov M.D.   On: 08/11/2016 19:07     CBC  Recent Labs Lab 08/11/16 1100 08/12/16 0343  WBC 8.0 7.9  HGB 11.3* 8.8*  HCT 34.7* 27.5*  PLT 165 157  MCV 93.0 92.3  MCH 30.3 29.5  MCHC 32.6 32.0  RDW 14.4 14.2  LYMPHSABS 0.8  --   MONOABS 0.6  --   EOSABS 0.1  --    BASOSABS 0.0  --     Chemistries   Recent Labs Lab 08/11/16 1100 08/12/16 0343  NA 137 134*  K 4.2 3.8  CL 103 104  CO2 24 22  GLUCOSE 121* 186*  BUN 17 23*  CREATININE 1.01* 1.42*  CALCIUM 9.3 8.5*  AST 16  --   ALT 8*  --   ALKPHOS 73  --   BILITOT 0.7  --    ------------------------------------------------------------------------------------------------------------------ estimated creatinine clearance is 26.6 mL/min (A) (by C-G formula based on SCr of 1.42 mg/dL (H)). ------------------------------------------------------------------------------------------------------------------ No results for input(s): HGBA1C in the last 72 hours. ------------------------------------------------------------------------------------------------------------------ No results for input(s): CHOL, HDL, LDLCALC, TRIG, CHOLHDL, LDLDIRECT in the last 72 hours. ------------------------------------------------------------------------------------------------------------------  Recent Labs  08/12/16 0343  TSH 0.995   ------------------------------------------------------------------------------------------------------------------ No results for input(s): VITAMINB12, FOLATE, FERRITIN, TIBC, IRON, RETICCTPCT in the last 72 hours.  Coagulation profile  Recent Labs Lab 08/11/16 1100  INR 1.00    No results for input(s): DDIMER in the last 72 hours.  Cardiac Enzymes No results for input(s): CKMB, TROPONINI, MYOGLOBIN in the last 168 hours.  Invalid input(s): CK ------------------------------------------------------------------------------------------------------------------ Invalid input(s): POCBNP   CBG: No results for input(s): GLUCAP in the last 168 hours.     Studies: Dg Knee 1-2 Views Right  Result Date: 08/11/2016 CLINICAL DATA:  81 year old female with painful right total knee replacement. Swelling and pain for 2 days. EXAM: RIGHT KNEE - 1-2 VIEW COMPARISON:  None.  FINDINGS: Large joint effusion evident on the cross-table lateral view. Total knee arthroplasty hardware appears intact and normally aligned. Patella appears intact. Dystrophic calcifications at the quadriceps tendon. No acute osseous abnormality identified. IMPRESSION: Large joint effusion. No acute osseous abnormality or adverse hardware features identified. Electronically Signed   By: Odessa Fleming M.D.   On: 08/11/2016 13:34   Dg Knee Right Port  Result Date: 08/11/2016 CLINICAL DATA:  S/P IRRIGATION AND DEBRIDEMENT KNEE WITH POLY EXCHANGE (Right Leg Upper) EXAM: PORTABLE RIGHT KNEE - 1-2 VIEW COMPARISON:  08/11/2016 FINDINGS: Status post right  knee arthroplasty. Locules of gas are identified in the joint space and soft tissues. A surgical drain is identified in the joint space. There is no acute fracture. IMPRESSION: Soft tissue gas and surgical drain following urination. Electronically Signed   By: Norva Pavlov M.D.   On: 08/11/2016 19:07      No results found for: HGBA1C Lab Results  Component Value Date   LDLCALC 144 (H) 12/07/2015   CREATININE 1.42 (H) 08/12/2016       Scheduled Meds: . acidophilus  1 capsule Oral QHS  . carvedilol  12.5 mg Oral BID WC  . docusate sodium  100 mg Oral BID  . enoxaparin (LOVENOX) injection  40 mg Subcutaneous Q24H  . feeding supplement (ENSURE ENLIVE)  237 mL Oral BID BM  . ketorolac  15 mg Intravenous Q6H  . levothyroxine  112 mcg Oral QAC breakfast  . losartan  50 mg Oral Daily  . rifampin  300 mg Oral Q12H  . senna  2 tablet Oral QHS   Continuous Infusions: . sodium chloride 150 mL/hr at 08/12/16 0001  .  ceFAZolin (ANCEF) IV Stopped (08/12/16 0917)  . methocarbamol (ROBAXIN)  IV       LOS: 1 day    Time spent: >30 MINS    Richarda Overlie  [facility] Pager [phone]. If 7PM-7AM, please contact night-coverage at [email or web address], password [facility] 08/12/2016, 12:12 PM  LOS: 1 day

## 2016-08-12 NOTE — Evaluation (Signed)
Occupational Therapy Evaluation Patient Details Name: [name] [name] MRN: [id number] DOB: 05/26/1935 Today's Date: 08/12/2016    History of Present Illness Pt is a 81 yo female admitted from [facility] hospital on 06/21/16. Pt was admitted to [facility] on 06/13/16 with a fever, nausea and vomiting and generalized weakness. Pt developed sepsis, hypotension and mild AKI and was diagnosed with MSSA bacteremia. Source is still in question, now considering leads from implaned defibrillator. PMH significant for lumbar surgery april, CHF, cardiomyopathy, hypothyroidism, biventricular ICD.     Clinical Impression   PTA, pt was living with her husband and was performing her ADLs. Currently, pt requires Min A for LB ADLs and functional mobility using RW. Pt would benefit form acute OT to facilitate safe dc and increase pt safety and independence with ADLs. Recommend dc home once medically stable per physician.      Follow Up Recommendations  Supervision/Assistance - 24 hour;No OT follow up    Equipment Recommendations  None recommended by OT    Recommendations for Other Services PT consult     Precautions / Restrictions Precautions Precautions: Fall Restrictions Weight Bearing Restrictions: Yes RLE Weight Bearing: Weight bearing as tolerated      Mobility Bed Mobility Overal bed mobility: Needs Assistance Bed Mobility: Supine to Sit     Supine to sit: Supervision     General bed mobility comments: Able to perform bed mobiltiy with no physical A  Transfers Overall transfer level: Needs assistance Equipment used: 1 person hand held assist Transfers: Sit to/from Stand Sit to Stand: Min assist         General transfer comment: Min A for balance    Balance Overall balance assessment: Needs assistance Sitting-balance support: No upper extremity supported;Feet supported Sitting balance-Leahy Scale: Good     Standing balance support: Bilateral upper extremity  supported;During functional activity Standing balance-Leahy Scale: Poor Standing balance comment: Reliant on UE support                           ADL either performed or assessed with clinical judgement   ADL Overall ADL's : Needs assistance/impaired Eating/Feeding: Set up;Sitting   Grooming: Oral care;Brushing hair;Set up;Sitting   Upper Body Bathing: Set up;Supervision/ safety;Sitting   Lower Body Bathing: Sit to/from stand;Minimal assistance   Upper Body Dressing : Set up;Supervision/safety;Sitting   Lower Body Dressing: Minimal assistance;Sit to/from stand Lower Body Dressing Details (indicate cue type and reason): Pt donned R sock while seated at EOB Toilet Transfer: Minimal assistance;Ambulation;Cueing for sequencing (Simulated to recliner; two hand assist)           Functional mobility during ADLs: Minimal assistance (two hand hold A) General ADL Comments: Pt able to perform grooming and UB ADLs while sitting. Requires Min A for standing during LB ADLs. Pt limited by pain in R knee (and R goin area). Feel pt will progress well and pain decreases. Pt very motivated to participate and was very pleasant     Vision         Perception     Praxis      Pertinent Vitals/Pain Pain Assessment: 0-10 Pain Score: 7  Pain Location: R knee; R goin Pain Descriptors / Indicators: Constant;Discomfort;Grimacing;Guarding Pain Intervention(s): Monitored during session;Limited activity within patient's tolerance;Repositioned;Ice applied     Hand Dominance Right   Extremity/Trunk Assessment Upper Extremity Assessment Upper Extremity Assessment: Overall WFL for tasks assessed   Lower Extremity Assessment Lower Extremity Assessment: Defer to PT  evaluation   Cervical / Trunk Assessment Cervical / Trunk Assessment: Kyphotic   Communication Communication Communication: No difficulties   Cognition Arousal/Alertness: Awake/alert Behavior During Therapy: WFL for tasks  assessed/performed Overall Cognitive Status: Within Functional Limits for tasks assessed                                     General Comments       Exercises     Shoulder Instructions      Home Living Family/patient expects to be discharged to:: Private residence Living Arrangements: Spouse/significant other Available Help at Discharge: Family;Available 24 hours/day Type of Home: House Home Access: Stairs to enter [employer] of Steps: 1 little step   Home Layout: Multi-level;Able to live on main level with bedroom/bathroom     Bathroom Shower/Tub: Tub/shower unit;Curtain   Firefighter: Standard     Home Equipment: Emergency planning/management officer - 2 wheels;Tub bench;Grab bars - tub/shower;Grab bars - toilet;Hand held shower head;Adaptive equipment;Toilet riser Adaptive Equipment: Reacher        Prior Functioning/Environment Level of Independence: Independent with assistive device(s)        Comments: Handling her own PIC line and performing ADLs since last admittion        OT Problem List: Decreased strength;Decreased activity tolerance;Impaired balance (sitting and/or standing);Decreased safety awareness;Decreased knowledge of use of DME or AE;Decreased knowledge of precautions;Pain      OT Treatment/Interventions: Self-care/ADL training;Therapeutic exercise;Energy conservation;DME and/or AE instruction;Therapeutic activities;Patient/family education    OT Goals(Current goals can be found in the care plan section) Acute Rehab OT Goals Patient Stated Goal: Go home OT Goal Formulation: With patient Time For Goal Achievement: 08/26/16 Potential to Achieve Goals: Good ADL Goals Pt Will Perform Grooming: with min guard assist;standing Pt Will Perform Lower Body Dressing: with min guard assist;sit to/from stand Pt Will Transfer to Toilet: with min guard assist;bedside commode;ambulating Pt Will Perform Tub/Shower Transfer: Tub transfer;with min  guard assist;ambulating;rolling walker;tub bench  OT Frequency: Min 2X/week   Barriers to D/C:            Co-evaluation              AM-PAC PT "6 Clicks" Daily Activity     Outcome Measure Help from another person eating meals?: None Help from another person taking care of personal grooming?: A Little Help from another person toileting, which includes using toliet, bedpan, or urinal?: A Little Help from another person bathing (including washing, rinsing, drying)?: A Little Help from another person to put on and taking off regular upper body clothing?: None Help from another person to put on and taking off regular lower body clothing?: A Little 6 Click Score: 20   End of Session Equipment Utilized During Treatment: Gait belt Nurse Communication: Mobility status;Other (comment) (pain in goin)  Activity Tolerance: Patient tolerated treatment well;Patient limited by pain Patient left: in chair;with call bell/phone within reach;with nursing/sitter in room  OT Visit Diagnosis: Unsteadiness on feet (R26.81);Other abnormalities of gait and mobility (R26.89);Muscle weakness (generalized) (M62.81);Pain Pain - Right/Left: Right Pain - part of body: Knee;Leg                Time: 2956-2130 OT Time Calculation (min): 28 min Charges:  OT General Charges $OT Visit: 1 Procedure OT Evaluation $OT Eval Low Complexity: 1 Procedure OT Treatments $Self Care/Home Management : 8-22 mins G-Codes:     Travez Stancil MSOT, OTR/L Acute Rehab  Pager: [phone] Office: [phone]  Theodoro Grist Kennisha Qin 08/12/2016, 11:48 AM

## 2016-08-12 NOTE — Consult Note (Signed)
[facility]    Reason for Consult: Septic knee after recent MSSA bacteremia    Referring Physician: Dr. Susie Cassette   Recommendations: - Continue cefazolin (8/5 - ) - Continue rifampin 300 mg bid (8/5 - ) - Follow up knee aspirate culture, blood cultures, and knee tissue cultures and sensitivites - Recommend consultation with Electrophysiology and Cardiology regarding management of AICD. Will need repeat TTE/TEE to reevaluate for vegetations. High likelihood that device has seeded the infection. - Patient will need at least 6 weeks IV antibiotics. Patient may need longer-term anticoagulation should her AICD not be operatively managed. She will likely need 6 months antibiotics for prosthetic joint infection  Assessment: Patient presents with septic arthritis in setting of MSSA infection less than 2 months ago. TTE and TEE performed at [facility] prior to first presentation was negative for lead or valvular vegetations She received 4 weeks of IV cefazolin, surveillance culture one week after completing IV antibiotics showed no growth, she did not follow with oral therapy. Despite initial negative echocardiography, there is high suspicion for infection seeding to her AICD given recurrence of infection and staph aureus predilection for implanted devices. It is also possible that her knee implants house infection. Patient will need to have cardiac devices replaced to allow for clearance of MSSA, however risks and benefits of undergoing the procedure vs being on long-term antibiotics and possible recurrence of bacteremia should be discussed with Electrophysiology and Cardiology.    [facility] Staphylococcus aureus bacteremia   Staphylococcus aureus bacteremia (SAB) is associated with a high rate of complications and mortality.  Specific aspects of clinical management are critical to optimizing the outcome of patients with SAB.  Therefore, the [facility]  Health Antimicrobial Management Team [facility]) has initiated an intervention aimed at improving the management of SAB at [facility].  To do so, Infectious Diseases physicians are providing an evidence-based consult for the management of all patients with SAB.     Yes No Comments  Perform follow-up blood cultures (even if the patient is afebrile) to ensure clearance of bacteremia []  []    Remove vascular catheter and obtain follow-up blood cultures after the removal of the catheter []  []    Perform echocardiography to evaluate for endocarditis (transthoracic ECHO is 40-50% sensitive, TEE is > 90% sensitive) []  []  Please keep in mind, that neither test can definitively EXCLUDE endocarditis, and that should clinical suspicion remain high for endocarditis the patient should then still be treated with an "endocarditis" duration of therapy = 6 weeks  Pt will need repeat TTE and TEE  Consult electrophysiologist to evaluate implanted cardiac device (pacemaker, ICD) []  []    Ensure source control  []  []  Have all abscesses been drained effectively? Have deep seeded infections (septic joints or osteomyelitis) had appropriate surgical debridement?  AICD possible source. S/p I&D right knee 8/5, portion of implant retained   Investigate for "metastatic" sites of infection []  []  Does the patient have ANY symptom or physical exam finding that would suggest a deeper infection (back or neck pain that may be suggestive of vertebral osteomyelitis or epidural abscess, muscle pain that could be a symptom of pyomyositis)?  Keep in mind that for deep seeded infections MRI imaging with contrast is preferred rather than other often insensitive tests such as plain x-rays, especially early in a patient's presentation.  Change antibiotic therapy to __________________ []  []  Beta-lactam antibiotics are preferred for MSSA due to higher cure rates.   If on Vancomycin,  goal trough should be 15 - 20 mcg/mL  MSSA suspected due to  prior infection, will follow up culture data   Estimated duration of IV antibiotic therapy:   []  []  Consult case management for probably prolonged outpatient IV antibiotic therapy  Patient will need at 6 weeks IV antibiotics and at least 6 months oral therapy   Antibiotics: Cefazolin (8/5 - )  Principal Problem:   Septic arthritis (HCC) Active Problems:   Chronic systolic CHF (congestive heart failure), NYHA class 3 (HCC)   Hypothyroidism   Biventricular ICD (implantable cardioverter-defibrillator) in place   S/P lumbar fusion   MSSA bacteremia   Infection of total right knee replacement (HCC)   . acidophilus  1 capsule Oral QHS  . carvedilol  12.5 mg Oral BID WC  . docusate sodium  100 mg Oral BID  . [START ON 08/13/2016] enoxaparin (LOVENOX) injection  30 mg Subcutaneous Q24H  . feeding supplement (ENSURE ENLIVE)  237 mL Oral BID BM  . ketorolac  15 mg Intravenous Q6H  . levothyroxine  112 mcg Oral QAC breakfast  . losartan  50 mg Oral Daily  . rifampin  300 mg Oral Q12H  . senna  2 tablet Oral QHS     HPI: [name] [name] is a 81 y.o. female with a history of CHF, congestive dilated cardiomyopathy, AICD in place, spine surgery (04/2016), and bilateral total knee replacement (2004) who presents with R knee septic arthritis in the setting of recent MSSA bacteremia.  She was admitted in 06/2016 for MSSA bacteremia, TTE and TEE at [facility] were negative for vegetation. Patient seen by Dr. Luciana Axe while in-house, discharged home and completed 4 weeks of IV cefazolin. Had surveillance culture one week later that was negative. She was back to her normal state of health until 8/2 when she started noticing R knee pain. It started to become hot and swollen the following day, which initially improved but exacerbated yesterday morning, for which she was brought in.   Patient had knee aspirated which showed 23k WBCs with neutrophil predominance, gram stain and culture showed Gram positive cocci in  clusters. Patient was otherwise afebrile and hemodynamically stable, WBC at 8. Blood cultures show obtained on intake show no growth in <24 hours. UA showed proteinuria and was otherwise unremarkable. Patient had I&D of R knee on 8/5, tissue culture collected and pending, initially showing rare Gram positive cocci in pairs. She was started on IV cefazolin and rifampin.  Review of Systems: Review of Systems  Constitutional: Negative for chills, fever and malaise/fatigue.  HENT: Negative for congestion.   Respiratory: Negative for cough and shortness of breath.   Cardiovascular: Negative for chest pain, palpitations and leg swelling.  Gastrointestinal: Negative for abdominal pain, nausea and vomiting.  Genitourinary: Negative for dysuria, frequency and hematuria.  Musculoskeletal: Positive for joint pain (at right knee). Negative for myalgias and neck pain.  Skin: Negative for rash.  Neurological: Negative for headaches.   All other systems reviewed and are negative    Past Medical History:  Diagnosis Date  . 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 AICD (automatic cardioverter/defibrillator) present   . Anemia    hx  . Angina effort (HCC)   . Arthritis    "finger joints" (06/21/2016)  . Asthma   . CHF (congestive heart failure) (HCC)   . Congestive dilated cardiomyopathy (HCC) 03/31/2014  . Daily  headache    "in the last week" (615/2018)  . GERD (gastroesophageal reflux disease)   . Heart murmur   . History of blood transfusion 1990s   "related to hysterectomy"  . Hypothyroidism   . LBBB (left bundle branch block) 10/30/2013  . Left bundle branch block   . MSSA bacteremia 06/21/2016  . Myocardial infarction (HCC)    "I've been told I've had some; I've never felt them" (06/21/2016)  . Pancreatitis, acute   . Pneumonia 06/2016  . Presence of permanent cardiac pacemaker   . Thyroid disease      Social History  Substance Use Topics  . Smoking status: Never Smoker  . Smokeless tobacco: Never Used     Comment: "smoked as a teenager; for maybe 1 wk"  . Alcohol use Yes     Comment: 6/15//2018 "glass of wine q 6 months or so"    Family History  Problem Relation Age of Onset  . Hyperlipidemia Mother   . Arrhythmia Mother        palpitations  . Heart disease Father   . Heart attack Father   . Heart attack Maternal Grandfather   . Heart attack Paternal Grandfather     Allergies  Allergen Reactions  . Sulfa Antibiotics Hives    Physical Exam: Constitutional: in no apparent distress, non-toxic, alert and cooperative  Vitals:   08/12/16 0420 08/12/16 1500  BP: (!) 113/48 (!) 108/50  Pulse: 67 82  Resp: 18 18  Temp: 97.6 F (36.4 C) 97.9 F (36.6 C)   EYES: anicteric, no conjunctival pallor ENMT: MMM, oropharynx clear Cardiovascular: RRR, S1 and S2 present. No murmur Respiratory: clear to auscultation bilaterally. Normal work of breathing GI: Bowel sounds are normal, soft, non-distended, no CVA tenderness Musculoskeletal: Ace bandage in place overlying right lower extremity. peripheral pulses normal, no pedal edema, no clubbing or cyanosis Skin: no rash or lesions Neuro/Psych: Sensation grossly intact, movement in toes in R foot, full ROM in left lower extremity and upper extremities bilaterally. Normal mood and affect  Lab Results  Component Value Date   WBC 7.9 08/12/2016   HGB 8.8 (L) 08/12/2016   HCT 27.5 (L) 08/12/2016   MCV 92.3 08/12/2016   PLT 157 08/12/2016    Lab Results  Component Value Date   CREATININE 1.42 (H) 08/12/2016   BUN 23 (H) 08/12/2016   NA 134 (L) 08/12/2016   K 3.8 08/12/2016   CL 104 08/12/2016   CO2 22 08/12/2016    Lab Results  Component Value Date   ALT 8 (L) 08/11/2016   AST 16 08/11/2016   ALKPHOS 73 08/11/2016     Microbiology: Recent Results (from the past 240 hour(s))  Culture, body fluid-bottle     Status:  None (Preliminary result)   Collection Time: 08/11/16  1:46 PM  Result Value Ref Range Status   Specimen Description FLUID SYNOVIAL RIGHT KNEE  Final   Special Requests NONE  Final   Gram Stain   Final    GRAM POSITIVE COCCI IN CLUSTERS IN BOTH AEROBIC AND ANAEROBIC BOTTLES    Culture GRAM POSITIVE COCCI  Final   Report Status PENDING  Incomplete  Gram stain     Status: None   Collection Time: 08/11/16  1:46 PM  Result Value Ref Range Status   Specimen Description FLUID SYNOVIAL RIGHT KNEE  Final   Special Requests NONE  Final   Gram Stain   Final    ABUNDANT WBC PRESENT, PREDOMINANTLY PMN RARE  INTRACELLULAR GRAM POSITIVE COCCI IN PAIRS    Report Status 08/11/2016 FINAL  Final  Blood Culture (routine x 2)     Status: None (Preliminary result)   Collection Time: 08/11/16  1:57 PM  Result Value Ref Range Status   Specimen Description BLOOD RIGHT ANTECUBITAL  Final   Special Requests IN PEDIATRIC BOTTLE Blood Culture adequate volume  Final   Culture NO GROWTH < 24 HOURS  Final   Report Status PENDING  Incomplete  Blood Culture (routine x 2)     Status: None (Preliminary result)   Collection Time: 08/11/16  2:00 PM  Result Value Ref Range Status   Specimen Description BLOOD LEFT ANTECUBITAL  Final   Special Requests   Final    BOTTLES DRAWN AEROBIC AND ANAEROBIC Blood Culture adequate volume   Culture NO GROWTH < 24 HOURS  Final   Report Status PENDING  Incomplete  Aerobic/Anaerobic Culture (surgical/deep wound)     Status: None (Preliminary result)   Collection Time: 08/11/16  4:55 PM  Result Value Ref Range Status   Specimen Description KNEE RIGHT TISSUE  Final   Special Requests SPEC A  Final   Gram Stain   Final    FEW WBC PRESENT, PREDOMINANTLY PMN RARE GRAM POSITIVE COCCI IN PAIRS Gram Stain Report Called to,Read Back By and Verified With: A COLLINS,RN AT 1802 08/11/16 BY L BENFIELD    Culture   Final    FEW STAPHYLOCOCCUS AUREUS SUSCEPTIBILITIES TO FOLLOW    Report  Status PENDING  Incomplete    Will Leopold Smyers, MS4  08/12/2016, 3:45 PM

## 2016-08-12 NOTE — Progress Notes (Signed)
Transitions of Care - Pain Medication Review for Discharge to Home  Patient [name] [name] is a 81 YO F hospitalized on 08/11/16, s/p debridement of R knee secondary to septic arthritis and remote R TKR in 2004.   PTA, patient is not currently on any opioid pain medications, confirmed by CSRS check on 08/12/16. Previously on hydrocodone-acetaminophen 5-325 mg po q4h prn, but has not had any filled since April 2018. PTA med list includes methylphenidate 20 mg bid prn for narcolepsy, but no recent history of this is found in the CSRS.    Pertinent co-morbid conditions include congestive dilated cardiomyopathy, chronic systolic CHF, hypothyroidism and has AICD pacer in place. Lumbar fusion in 04/2016 and recent admission in June for MSSA bacteremia.    CrCl=34.7 ml/min (using Cockcroft-Gault equation).   Other pertinent PTA medications include ASA-APAP-caffeine 250-250-65 mg q6h prn for headache or migraine and methocarbamol 750 mg po bid prn for muscle spasms and leg spasms. States that when she is in pain at home, Excedrin is her "go-to medication".   [place] CSRS reviewed on 08/12/16 and shows that the patient should have 0 days worth of their home opiate pain medications remaining.   Today, patient reports 5.5/10 for surgical pain. Today, she has not received any opioid medications and states that she wishes to not take any opioids if she can avoid it.  States that her pain in her knee is improving a lot since completing surgery. She is prescribed hydrocodone-acetaminophen 5-325 mg, 1-2 tabs po q4h prn for breakthrough pain and hydromorphone 0.5-2 mg IV q2h prn severe pain. She had not needed either of these.   Non-opioid pain management includes scheduled ketorolac 15 mg IV q6 hours for 4 doses and acetaminophen 650 mg po q6h prn mild pain, which the patient has received one dose of this AM.   States that pain is located in R knee and describes pain as gradual and aching.    Patient states that her pain  goal is to have no pain, but is realistic that she may have some pain and limits in her functionality after discharge. States she will like to work back up to working in her garden each day and that she will "take it easy" until she is able to do so.   Patient unaware of any renal impairment that may limit her from using NSAIDs such as ibuprofen, counseled patient to check with MD and to not use NSAID's unless approved by MD.    -------------------------------------------------------------  Recommendation:  1. Counsel patient to avoid excessive caffeine-containing Excedrin use. 2. Consider prescribing Acetaminophen 650 mg po QID x 5-7 days, then taper to use PRN.   Counseling provided:  1. Avoid at-home OTC NSAIDs unless approved by physician.

## 2016-08-12 NOTE — Op Note (Signed)
NAME:  [name] [name], [name] [name]                    ACCOUNT NO.:  MEDICAL RECORD NO.:  [id number]  LOCATION:                                 FACILITY:  PHYSICIAN:  Samson Frederic, MD     DATE OF BIRTH:  1935-04-28  DATE OF PROCEDURE:  08/11/2016 DATE OF DISCHARGE:                              OPERATIVE REPORT   PREOPERATIVE DIAGNOSIS:  Periprosthetic joint infection, right knee.  POSTOPERATIVE DIAGNOSIS:  Periprosthetic joint infection, right knee.  PROCEDURE PERFORMED:  Debridement of right knee with poly liner exchange.  SURGEON:  Samson Frederic, MD  ASSISTANT:  Arsenio Loader, PA-C.  ANESTHESIA:  General.  EBL:  Less than 50 mL.  SPECIMENS:  Right knee synovium for tissue culture.  EXPLANTS:  DePuy Sigma RP poly, size 3, 12.5 mm.  IMPLANTS:  DePuy Sigma RP poly size 3, 15 mm.  TUBES AND DRAINS: 1. Medium Hemovac x1. 2. Incisional wound VAC at 75 mmHg.  COMPLICATIONS:  None.  TOURNIQUET TIME:  53 minutes.  DISPOSITION:  Stable to PACU.  INDICATIONS:  The patient is a 81 year old female, who has a history of right total knee arthroplasty by Dr. Ollen Gross in 2004.  The knee replacement was functioning very well until 2 days ago when she had sudden onset pain, swelling and warmth.  The patient was treated for MSSA bacteremia recently and her course of antibiotics ended on July 11, 2016.  She had never had any problems with the knee replacement until about 2 days ago.  She came to the hospital, I aspirated her knee in the ER.  She was found have 23,000 white blood cells with 80% polys.  Her inflammatory markers were elevated.  She was admitted to the Hospitalist Service.  We discussed the risks, benefits, alternatives to debridement and poly liner exchange, she elected to proceed.  DESCRIPTION OF PROCEDURE IN DETAIL:  The patient was identified in the holding area and using two identifiers, I marked the surgical site.  She was taken to the operating room.  General  anesthesia was induced. Nonsterile tourniquet was applied to the right groin.  Right lower extremity was prepped and draped in normal sterile surgical fashion. Time-out was called verifying side and site of surgery.  She did not receive antibiotics preoperatively due to the fact we wanted to obtain intraop cultures.  I used gravity to exsanguinate the extremity.  I elevated the tourniquet to 300 mmHg.  I used a #10 blade to sharply excise her previous scar.  Full-thickness skin flaps were created.  I made a medial parapatellar arthrotomy.  She did have some purulent joint fluid, which we encountered.  I brought the knee into full extension.  I performed a radical synovectomy of the infrapatellar scar as well as the medial and lateral gutters and the suprapatellar pouch.  Representative specimen of the synovium was sent for tissue culture.  I flexed the knee up to 90 degrees.  I removed the polyethylene liner.  There was no evidence of loosening of the implant.  Overall, the synovium did not look chronically inflamed.  A laminar spreader was placed and I performed a synovectomy of the posterior compartment  of the knee.  The wound was then copiously irrigated with 9 L of saline.  Old previous synovial tissue was debrided and removed excisionally.  I then placed the new polyethylene liner.  The tourniquet was let down.  Meticulous hemostasis was achieved.  I closed the arthrotomy over a medium Hemovac drain with #1 PDS and 0 V-Loc suture.  Deep dermal layer was closed with 2-0 interrupted Monocryl.  Skin was closed with 2-0 vertical mattress nylon sutures.  Incisional wound VAC was placed.  Suction was connected, 75 mmHg.  There was no leak.  Ace wrap was applied.  The patient was then extubated, taken to the PACU in stable condition.  Sponge, needle and instrument counts were correct at the end of the case x2.  There were no known complications.  Please note that after cultures  were obtained, 2 g of IV Ancef were given.  Postoperatively, the patient will be admitted to the Hospitalist Service.  She may weightbear as tolerated with the walker.  We will place her on Lovenox for DVT prophylaxis.  Infectious Disease has already been consulted.  She will need a PICC line.  We will discontinue the Hemovac drain when the output is less than 30 mL per shift.  I will remove her incisional wound VAC just prior to discharge.  She will need to have home IV antibiotics arranged.  We will follow intraop cultures.          ______________________________ Samson Frederic, MD     BS/MEDQ  D:  08/11/2016  T:  08/11/2016  Job:  [id number]

## 2016-08-13 ENCOUNTER — Encounter (HOSPITAL_COMMUNITY): Payer: Self-pay | Admitting: Nurse Practitioner

## 2016-08-13 DIAGNOSIS — Z96651 Presence of right artificial knee joint: Secondary | ICD-10-CM

## 2016-08-13 DIAGNOSIS — T8484XA Pain due to internal orthopedic prosthetic devices, implants and grafts, initial encounter: Secondary | ICD-10-CM | POA: Diagnosis present

## 2016-08-13 DIAGNOSIS — M01X0 Direct infection of unspecified joint in infectious and parasitic diseases classified elsewhere: Secondary | ICD-10-CM

## 2016-08-13 DIAGNOSIS — T8484XD Pain due to internal orthopedic prosthetic devices, implants and grafts, subsequent encounter: Secondary | ICD-10-CM

## 2016-08-13 LAB — T4, FREE: FREE T4: 1.65 ng/dL — AB (ref 0.61–1.12)

## 2016-08-13 LAB — IRON AND TIBC
Iron: 18 ug/dL — ABNORMAL LOW (ref 28–170)
Saturation Ratios: 7 % — ABNORMAL LOW (ref 10.4–31.8)
TIBC: 274 ug/dL (ref 250–450)
UIBC: 256 ug/dL

## 2016-08-13 LAB — CBC
HEMATOCRIT: 22.6 % — AB (ref 36.0–46.0)
HEMOGLOBIN: 7.3 g/dL — AB (ref 12.0–15.0)
MCH: 29.6 pg (ref 26.0–34.0)
MCHC: 32.3 g/dL (ref 30.0–36.0)
MCV: 91.5 fL (ref 78.0–100.0)
Platelets: 160 10*3/uL (ref 150–400)
RBC: 2.47 MIL/uL — AB (ref 3.87–5.11)
RDW: 14 % (ref 11.5–15.5)
WBC: 7.2 10*3/uL (ref 4.0–10.5)

## 2016-08-13 LAB — SEDIMENTATION RATE: SED RATE: 60 mm/h — AB (ref 0–22)

## 2016-08-13 LAB — RETICULOCYTES
RBC.: 2.61 MIL/uL — AB (ref 3.87–5.11)
RETIC CT PCT: 2.7 % (ref 0.4–3.1)
Retic Count, Absolute: 70.5 10*3/uL (ref 19.0–186.0)

## 2016-08-13 LAB — C-REACTIVE PROTEIN: CRP: 10.1 mg/dL — AB (ref <1.0)

## 2016-08-13 LAB — FERRITIN: Ferritin: 64 ng/mL (ref 11–307)

## 2016-08-13 LAB — PREPARE RBC (CROSSMATCH)

## 2016-08-13 LAB — VITAMIN B12: VITAMIN B 12: 311 pg/mL (ref 180–914)

## 2016-08-13 LAB — HIV ANTIBODY (ROUTINE TESTING W REFLEX): HIV Screen 4th Generation wRfx: NONREACTIVE

## 2016-08-13 LAB — FOLATE: FOLATE: 6.1 ng/mL (ref >5.9)

## 2016-08-13 MED ORDER — SODIUM CHLORIDE 0.9 % IV SOLN
Freq: Once | INTRAVENOUS | Status: AC
  Administered 2016-08-13: 14:00:00 via INTRAVENOUS

## 2016-08-13 NOTE — Progress Notes (Addendum)
      INFECTIOUS DISEASE ATTENDING ADDENDUM:   Date: 08/13/2016  [name]  Medical record number: [id number]  Date of birth: 06-Mar-1935    This patient has been seen and discussed with the house staff. Please see the resident's note for complete details. I concur with their findings with the following additions/corrections:  Patient seems relatively comfortable this morning still of course with some knee pain.  Her blood cultures are no growth today but I would like to make sure there definitely sterile prior to placing a PICC line.  Also want to have the issue of her cardiac device infection addressed  I have paged Card Master and requested TEE and formal Cardiology Consult. I have also texted Dr. Ladona Ridgel.  We need to have plan with re to device sorted out prior to proceeding further  For now continue Ancef and rifampin  I spent greater than 35  minutes with the patient including greater than 50% of time in face to face counsel of the patient and in coordination of their care with Cardiology.   [name] 08/13/2016, 10:36 AM

## 2016-08-13 NOTE — Progress Notes (Signed)
RN discontinued PICC line order per MD verbal order.

## 2016-08-13 NOTE — Care Management Note (Signed)
Case Management Note  Patient Details  Name: [name] [name] MRN: [id number] Date of Birth: 02-25-1935  Subjective/Objective:  81 yr old female admitted with septic right knee, s/p I & D with poly exchange. Patient will have pace maker and defibrillator removed prior to having PICC line placed.               Action/Plan: Case manager spoke with patient and her husband concerning discharge plan and Home Health Needs. Choice for Home Health agency was offered, patient says she has recently used [facility] and they were great, wants to use them again. CM called referral to [name] [name], [facility] Liaison. Referral was called to [name] Modena, RN IV Specialist with [facility], they will provide antibiotics. Patient's husband will provide care at discharge. CM will continue to monitor.  Expected Discharge Date:    pending              Expected Discharge Plan:  Home w Home Health Services  In-House Referral:  NA  Discharge planning Services  CM Consult  Post Acute Care Choice:  Home Health Choice offered to:  Patient, Spouse  DME Arranged:  IV pump/equipment DME Agency:  NA  HH Arranged:  RN, PT HH Agency:  [facility]  Status of Service:  In process, will continue to follow  If discussed at Long Length of Stay Meetings, dates discussed:    Additional Comments:  [name] Guthrie, RN 08/13/2016, 1:04 PM

## 2016-08-13 NOTE — Progress Notes (Signed)
Physical Therapy Treatment Patient Details Name: [name] [name] MRN: [id number] DOB: July 12, 1935 Today's Date: 08/13/2016    History of Present Illness Pt is a 81 yo female admitted from [facility] hospital on 06/21/16. Pt was admitted to [facility] on 06/13/16 with a fever, nausea and vomiting and generalized weakness. Pt developed sepsis, hypotension and mild AKI and was diagnosed with MSSA bacteremia. Source is still in question, now considering leads from implaned defibrillator. PMH significant for lumbar surgery april, CHF, cardiomyopathy, hypothyroidism, biventricular ICD.  [name] Pt s/p I and D of R knee and poly exchange.    PT Comments    Pt performed increased gait during session.  C/o mild dizziness when transitioning from supine to sit.  Pt reports this resolved and not present during gait.  Pt to receive blood transfusion this pm.  Will continue PT per POC.  Informed case management of need for youth height RW at d/c.      Follow Up Recommendations  Outpatient PT     Equipment Recommendations  None recommended by PT    Recommendations for Other Services       Precautions / Restrictions Precautions Precautions: Fall Restrictions Weight Bearing Restrictions: Yes RLE Weight Bearing: Weight bearing as tolerated    Mobility  Bed Mobility Overal bed mobility: Needs Assistance Bed Mobility: Supine to Sit     Supine to sit: Supervision     General bed mobility comments: Pt performed with increased time due to fatigue.    Transfers Overall transfer level: Needs assistance Equipment used: Rolling walker (2 wheeled) Transfers: Sit to/from Stand Sit to Stand: Min guard         General transfer comment: increased time, good hand placement  Ambulation/Gait Ambulation/Gait assistance: Min guard Ambulation Distance (Feet): 160 Feet (standing rest break mid way with increased DOE.  ) Assistive device: Rolling walker (2 wheeled) Gait Pattern/deviations: Step-through  pattern;Trunk flexed;Decreased stride length Gait velocity: decreased Gait velocity interpretation: Below normal speed for age/gender General Gait Details: Pt performed increased gait but remains to fatigue.  DOE 2/4, O2 sats 96% on RA.  Cues for upper trunk control, improved posture noted with use of RW.     Stairs            Wheelchair Mobility    Modified Rankin (Stroke Patients Only)       Balance Overall balance assessment: Needs assistance Sitting-balance support: No upper extremity supported;Feet supported Sitting balance-Leahy Scale: Good       Standing balance-Leahy Scale: Fair                              Cognition Arousal/Alertness: Awake/alert Behavior During Therapy: WFL for tasks assessed/performed Overall Cognitive Status: Within Functional Limits for tasks assessed                                        Exercises      General Comments        Pertinent Vitals/Pain Pain Assessment: 0-10 Pain Score: 5  Pain Location: R knee; R goin Pain Descriptors / Indicators: Constant;Discomfort;Grimacing;Guarding Pain Intervention(s): Monitored during session;Repositioned    Home Living                      Prior Function            PT Goals (current  goals can now be found in the care plan section) Acute Rehab PT Goals Patient Stated Goal: Go home Potential to Achieve Goals: Good Progress towards PT goals: Progressing toward goals    Frequency    7X/week      PT Plan Current plan remains appropriate    Co-evaluation              AM-PAC PT "6 Clicks" Daily Activity  Outcome Measure  Difficulty turning over in bed (including adjusting bedclothes, sheets and blankets)?: A Lot Difficulty moving from lying on back to sitting on the side of the bed? : A Lot Difficulty sitting down on and standing up from a chair with arms (e.g., wheelchair, bedside commode, etc,.)?: A Little Help needed moving to and  from a bed to chair (including a wheelchair)?: A Little Help needed walking in hospital room?: A Little Help needed climbing 3-5 steps with a railing? : A Little 6 Click Score: 16    End of Session Equipment Utilized During Treatment: Gait belt Activity Tolerance: Patient tolerated treatment well Patient left: in chair;with call bell/phone within reach;with restraints reapplied Nurse Communication: Mobility status PT Visit Diagnosis: Unsteadiness on feet (R26.81);Difficulty in walking, not elsewhere classified (R26.2)     Time: 4540-9811 PT Time Calculation (min) (ACUTE ONLY): 23 min  Charges:  $Gait Training: 8-22 mins $Therapeutic Activity: 8-22 mins                    G Codes:       Joycelyn Rua, PTA pager [phone]    Florestine Avers 08/13/2016, 2:53 PM

## 2016-08-13 NOTE — Progress Notes (Signed)
Subjective:  Patient reports pain as mild to moderate.  No c/o.   Objective:   VITALS:   Vitals:   08/12/16 0420 08/12/16 1500 08/12/16 2044 08/13/16 0450  BP: (!) 113/48 (!) 108/50 (!) 111/50 (!) 122/52  Pulse: 67 82 79 81  Resp: 18 18 18 17   Temp: 97.6 F (36.4 C) 97.9 F (36.6 C) 98.5 F (36.9 C) 98.6 F (37 C)  TempSrc: Oral Oral Oral Oral  SpO2: 100% 100% 100% 100%  Weight:      Height:        NAD ABD soft Sensation intact distally Intact pulses distally Dorsiflexion/Plantar flexion intact Incision: dressing C/D/I Compartment soft Wound VAC intact HV ss  L knee: no erythema, swelling, or effusion. NO evidence of infection.   Lab Results  Component Value Date   WBC 7.2 08/13/2016   HGB 7.3 (L) 08/13/2016   HCT 22.6 (L) 08/13/2016   MCV 91.5 08/13/2016   PLT 160 08/13/2016   BMET    Component Value Date/Time   NA 134 (L) 08/12/2016 0343   K 3.8 08/12/2016 0343   CL 104 08/12/2016 0343   CO2 22 08/12/2016 0343   GLUCOSE 186 (H) 08/12/2016 0343   BUN 23 (H) 08/12/2016 0343   CREATININE 1.42 (H) 08/12/2016 0343   CREATININE 1.11 (H) 12/07/2015 0934   CALCIUM 8.5 (L) 08/12/2016 0343   GFRNONAA 34 (L) 08/12/2016 0343   GFRAA 40 (L) 08/12/2016 0343     Recent Results (from the past 240 hour(s))  Culture, body fluid-bottle     Status: Abnormal (Preliminary result)   Collection Time: 08/11/16  1:46 PM  Result Value Ref Range Status   Specimen Description FLUID SYNOVIAL RIGHT KNEE  Final   Special Requests NONE  Final   Gram Stain   Final    GRAM POSITIVE COCCI IN CLUSTERS IN BOTH AEROBIC AND ANAEROBIC BOTTLES    Culture (A)  Final    STAPHYLOCOCCUS AUREUS SUSCEPTIBILITIES TO FOLLOW    Report Status PENDING  Incomplete  Gram stain     Status: None   Collection Time: 08/11/16  1:46 PM  Result Value Ref Range Status   Specimen Description FLUID SYNOVIAL RIGHT KNEE  Final   Special Requests NONE  Final   Gram Stain   Final    ABUNDANT WBC  PRESENT, PREDOMINANTLY PMN RARE INTRACELLULAR GRAM POSITIVE COCCI IN PAIRS    Report Status 08/11/2016 FINAL  Final  Blood Culture (routine x 2)     Status: None (Preliminary result)   Collection Time: 08/11/16  1:57 PM  Result Value Ref Range Status   Specimen Description BLOOD RIGHT ANTECUBITAL  Final   Special Requests IN PEDIATRIC BOTTLE Blood Culture adequate volume  Final   Culture NO GROWTH < 24 HOURS  Final   Report Status PENDING  Incomplete  Blood Culture (routine x 2)     Status: None (Preliminary result)   Collection Time: 08/11/16  2:00 PM  Result Value Ref Range Status   Specimen Description BLOOD LEFT ANTECUBITAL  Final   Special Requests   Final    BOTTLES DRAWN AEROBIC AND ANAEROBIC Blood Culture adequate volume   Culture NO GROWTH < 24 HOURS  Final   Report Status PENDING  Incomplete  Aerobic/Anaerobic Culture (surgical/deep wound)     Status: None (Preliminary result)   Collection Time: 08/11/16  4:55 PM  Result Value Ref Range Status   Specimen Description KNEE RIGHT TISSUE  Final   Special  Requests SPEC A  Final   Gram Stain   Final    FEW WBC PRESENT, PREDOMINANTLY PMN RARE GRAM POSITIVE COCCI IN PAIRS Gram Stain Report Called to,Read Back By and Verified With: A COLLINS,RN AT 1802 08/11/16 BY L BENFIELD    Culture   Final    FEW STAPHYLOCOCCUS AUREUS SUSCEPTIBILITIES TO FOLLOW    Report Status PENDING  Incomplete     Assessment/Plan: 2 Days Post-Op   Principal Problem:   Septic arthritis (HCC) Active Problems:   Chronic systolic CHF (congestive heart failure), NYHA class 3 (HCC)   Hypothyroidism   Biventricular ICD (implantable cardioverter-defibrillator) in place   S/P lumbar fusion   MSSA bacteremia   Infection of total right knee replacement (HCC)   Infected defibrillator (HCC)   Painful total knee replacement, right (HCC)   WBAT with walker Abx per ID: ancef and rifampin DVT ppx: lovenox, SCDs, tEDs PO pain control Follow cultures:  likely MSSA D/C HV drain Dispo: will remove iVAC prior to d/c, needs long term IV abx   Nicosha Struve, Cloyde Reams 08/13/2016, 1:17 PM   Samson Frederic, MD Cell [phone]

## 2016-08-13 NOTE — Progress Notes (Signed)
[facility]  [facility] Infusion Coordinator following pt with ID team for home IV ABX.    [facility] will work with patients HH Agency of choice- [facility] .  If patient discharges after hours, please call [phone].   [name] [name] 08/13/2016, 1:59 PM

## 2016-08-13 NOTE — Progress Notes (Signed)
Date of Admission:  08/11/2016   Total days of antibiotics 3        Day 3 of IV cefazolin        Day 3 of PO rifampin  Recommendations: - Continue cefazolin (8/5 - ) - Continue rifampin 300 mg bid (8/5 - ) - Follow up knee aspirate culture growing Staph aureus, sensitivities pending. Follow up blood cultures, and knee tissue cultures   - TEE to reevaluate for vegetations given high likelihood of infection. Dr. Caryl Comes to evaluate after TEE, Electrophysiology has been consulted - Patient will need at least 6 weeks IV antibiotics. Patient may need longer-term antibiotics, will follow with Cardiology and EP regarding AICD management.  Assessment: Patient presents with septic arthritis in setting of MSSA infection less than 2 months ago. TTE and TEE performed at [facility] prior to first presentation was negative for lead or valvular vegetations She received 4 weeks of IV cefazolin, surveillance culture one week after completing IV antibiotics showed no growth, she did not follow with oral therapy. Despite initial negative echocardiography, there is high suspicion for infection seeding to her AICD given recurrence of infection and staph aureus predilection for implanted devices. It is also possible that her knee implants house infection. Patient will need to have cardiac devices replaced to allow for clearance of MSSA, however risks and benefits of undergoing the procedure vs being on long-term antibiotics and possible recurrence of bacteremia should be discussed with Electrophysiology and Cardiology.  Knee aspirate cultures: Staph aureus, susceptibilities pending Blood cultures pending Knee tissue cultures: Staph aureus, susceptibilities pending  CRP: 19.8 ESR: 74      [place] Antimicrobial Management Team Staphylococcus aureus bacteremia   Staphylococcus aureus bacteremia (SAB) is associated with a high rate of complications and mortality.  Specific aspects of clinical management are  critical to optimizing the outcome of patients with SAB.  Therefore, the [facility] Antimicrobial Management Team [facility]) has initiated an intervention aimed at improving the management of SAB at [facility].  To do so, Infectious Diseases physicians are providing an evidence-based consult for the management of all patients with SAB.     Yes No Comments  Perform follow-up blood cultures (even if the patient is afebrile) to ensure clearance of bacteremia '[]'$  '[x]'$    Remove vascular catheter and obtain follow-up blood cultures after the removal of the catheter '[]'$  '[x]'$    Perform echocardiography to evaluate for endocarditis (transthoracic ECHO is 40-50% sensitive, TEE is > 90% sensitive) '[]'$  '[x]'$  Please keep in mind, that neither test can definitively EXCLUDE endocarditis, and that should clinical suspicion remain high for endocarditis the patient should then still be treated with an "endocarditis" duration of therapy = 6 weeks  Consult electrophysiologist to evaluate implanted cardiac device (pacemaker, ICD) '[]'$  '[]'$  Contacted  Ensure source control '[]'$  '[]'$  Have all abscesses been drained effectively? Have deep seeded infections (septic joints or osteomyelitis) had appropriate surgical debridement?  I&D of knee performed. Suspect AICD as another source, will follow with EP about possible replacement  Investigate for "metastatic" sites of infection '[]'$  '[]'$  Does the patient have ANY symptom or physical exam finding that would suggest a deeper infection (back or neck pain that may be suggestive of vertebral osteomyelitis or epidural abscess, muscle pain that could be a symptom of pyomyositis)?  Keep in mind that for deep seeded infections MRI imaging with contrast is preferred rather than other often insensitive tests such as plain x-rays, especially early in a patient's presentation.  Implant remains though in both knees. AICD in place as above  Change antibiotic therapy to __________________ '[]'$  '[]'$  Beta-lactam  antibiotics are preferred for MSSA due to higher cure rates.   If on Vancomycin, goal trough should be 15 - 20 mcg/mL  Estimated duration of IV antibiotic therapy:   '[]'$  '[]'$  Consult case management for probably prolonged outpatient IV antibiotic therapy  At least 6 weeks IV antibiotics, prolonged oral therapy may be required if AICD is not replaced    Principal Problem:   Septic arthritis ([place]) Active Problems:   Chronic systolic CHF (congestive heart failure), NYHA class 3 (HCC)   Hypothyroidism   Biventricular ICD (implantable cardioverter-defibrillator) in place   S/P lumbar fusion   MSSA bacteremia   Infection of total right knee replacement ([place])   Infected defibrillator ([place])   . acidophilus  1 capsule Oral QHS  . carvedilol  12.5 mg Oral BID WC  . docusate sodium  100 mg Oral BID  . enoxaparin (LOVENOX) injection  30 mg Subcutaneous Q24H  . feeding supplement (ENSURE ENLIVE)  237 mL Oral BID BM  . levothyroxine  112 mcg Oral QAC breakfast  . losartan  50 mg Oral Daily  . rifampin  300 mg Oral Q12H  . senna  2 tablet Oral QHS    SUBJECTIVE: Patient reports having mild knee pain overnight but has improved this morning. No other acute events. Denies chills, n/v, chest pain, palpitations, shortness of breath, abdominal pain. Endorses having non-productive cough.   Review of Systems: Review of Systems  Constitutional: Negative.   HENT: Negative for congestion.   Respiratory:        Negative except as above  Cardiovascular: Negative for orthopnea and leg swelling.  Genitourinary: Negative.   Musculoskeletal:       Negative except as above  Skin: Negative for rash.  Neurological: Negative for dizziness and headaches.    Allergies  Allergen Reactions  . Sulfa Antibiotics Hives    OBJECTIVE: Vitals:   08/12/16 0420 08/12/16 1500 08/12/16 2044 08/13/16 0450  BP: (!) 113/48 (!) 108/50 (!) 111/50 (!) 122/52  Pulse: 67 82 79 81  Resp: '18 18 18 17  '$ Temp: 97.6 F  (36.4 C) 97.9 F (36.6 C) 98.5 F (36.9 C) 98.6 F (37 C)  TempSrc: Oral Oral Oral Oral  SpO2: 100% 100% 100% 100%  Weight:      Height:       Body mass index is 31.35 kg/m.  Physical Exam  Constitutional: She is oriented to person, place, and time and well-developed, well-nourished, and in no distress.  Eyes: Conjunctivae and EOM are normal. No scleral icterus.  Neck: Neck supple.  Cardiovascular: Normal rate, regular rhythm and normal heart sounds.   No murmur heard. Pulmonary/Chest: Effort normal and breath sounds normal.  Abdominal: Soft. Bowel sounds are normal. She exhibits no distension. There is no tenderness.  Musculoskeletal: She exhibits no edema.  Can move toes on R foot, other extremities with full ROM  Lymphadenopathy:    She has no cervical adenopathy.  Neurological: She is alert and oriented to person, place, and time.  Skin: Skin is warm and dry. No rash noted. She is not diaphoretic. No erythema.  Psychiatric: Mood and affect normal.    Lab Results Lab Results  Component Value Date   WBC 7.2 08/13/2016   HGB 7.3 (L) 08/13/2016   HCT 22.6 (L) 08/13/2016   MCV 91.5 08/13/2016   PLT 160 08/13/2016    Lab  Results  Component Value Date   CREATININE 1.42 (H) 08/12/2016   BUN 23 (H) 08/12/2016   NA 134 (L) 08/12/2016   K 3.8 08/12/2016   CL 104 08/12/2016   CO2 22 08/12/2016    Lab Results  Component Value Date   ALT 8 (L) 08/11/2016   AST 16 08/11/2016   ALKPHOS 73 08/11/2016   BILITOT 0.7 08/11/2016     Microbiology: Recent Results (from the past 240 hour(s))  Culture, body fluid-bottle     Status: None (Preliminary result)   Collection Time: 08/11/16  1:46 PM  Result Value Ref Range Status   Specimen Description FLUID SYNOVIAL RIGHT KNEE  Final   Special Requests NONE  Final   Gram Stain   Final    GRAM POSITIVE COCCI IN CLUSTERS IN BOTH AEROBIC AND ANAEROBIC BOTTLES    Culture GRAM POSITIVE COCCI  Final   Report Status PENDING   Incomplete  Gram stain     Status: None   Collection Time: 08/11/16  1:46 PM  Result Value Ref Range Status   Specimen Description FLUID SYNOVIAL RIGHT KNEE  Final   Special Requests NONE  Final   Gram Stain   Final    ABUNDANT WBC PRESENT, PREDOMINANTLY PMN RARE INTRACELLULAR GRAM POSITIVE COCCI IN PAIRS    Report Status 08/11/2016 FINAL  Final  Blood Culture (routine x 2)     Status: None (Preliminary result)   Collection Time: 08/11/16  1:57 PM  Result Value Ref Range Status   Specimen Description BLOOD RIGHT ANTECUBITAL  Final   Special Requests IN PEDIATRIC BOTTLE Blood Culture adequate volume  Final   Culture NO GROWTH < 24 HOURS  Final   Report Status PENDING  Incomplete  Blood Culture (routine x 2)     Status: None (Preliminary result)   Collection Time: 08/11/16  2:00 PM  Result Value Ref Range Status   Specimen Description BLOOD LEFT ANTECUBITAL  Final   Special Requests   Final    BOTTLES DRAWN AEROBIC AND ANAEROBIC Blood Culture adequate volume   Culture NO GROWTH < 24 HOURS  Final   Report Status PENDING  Incomplete  Aerobic/Anaerobic Culture (surgical/deep wound)     Status: None (Preliminary result)   Collection Time: 08/11/16  4:55 PM  Result Value Ref Range Status   Specimen Description KNEE RIGHT TISSUE  Final   Special Requests SPEC A  Final   Gram Stain   Final    FEW WBC PRESENT, PREDOMINANTLY PMN RARE GRAM POSITIVE COCCI IN PAIRS Gram Stain Report Called to,Read Back By and Verified With: A COLLINS,RN AT 1802 08/11/16 BY L BENFIELD    Culture   Final    FEW STAPHYLOCOCCUS AUREUS SUSCEPTIBILITIES TO FOLLOW    Report Status PENDING  Incomplete    Will Larnce Schnackenberg, MS4 08/13/2016, 8:42 AM

## 2016-08-13 NOTE — Progress Notes (Signed)
[facility] PROGRESS NOTE  [name] [name]:[id number] DOB: Oct 19, 1935 DOA: 08/11/2016   PCP: [name], MD     Assessment/Plan: Principal Problem:   Septic arthritis [facility]) Active Problems:   Chronic systolic CHF (congestive heart failure), NYHA class 3 (HCC)   Hypothyroidism   Biventricular ICD (implantable cardioverter-defibrillator) in place   S/P lumbar fusion   MSSA bacteremia   Infection of total right knee replacement (HCC)   Infected defibrillator [facility])    81 y.o. female with medical history significant of congestive dilated cardiomyopathy; chronic systolic CHF; hypothyroidism; AICD/pacer in place; s/p lumbar fusion in 4/18; and admission from 6/7-18 with MSSA bacteremia of uncertain etiology.  She "felt great" upon completing her antibiotics on 7/5.  She had a bilateral TKR 14 years ago and on Thursday (8/2) noticed R knee stiffness/pain.  On Friday (8/3) it was swollen and hot. Recent TTE and TEE were negative. Infectious disease consulted for recommendations regarding MSSA septic knee, also status post IND and poly-exchange by orthopedics  Assessment and plan Septic arthritis in very remote TKR in a patient with recent admission for MSSA bacteremia -She tested positive for Staph aureus (MSSA) in 6/16 and 4/18 -Blood cultures here were negative during her last admission and currently no growth so far Patient has been seen by infectious disease Continue cefazolin, right rifampin Patient is status post Single staged I and D and polyexchange Infectious disease recommends pacemaker removal, if not pursue patient will need 6 weeks of parenteral therapy, lifelong suppressive antibiotics ID recommends cardiology consultation for TEE, cardiology may want to consider device removal, they have been consulted     Compensated CHF -h/o dilated cardiomyopathy, currently appears to be compensated, continue Coreg  -Limited Echo in 10/17 showed grade 1 diastolic  dysfunction -Echo in 1/17 showed improved EF from 35-40% to 50-55%  Hypothyroidism   TSH 0.995 -Continue Synthroid at current dose for now  Acute blood loss anemia/anemia of chronic disease Baseline hemoglobin around 10, dropped from 11.3>8.8>7.3 Check anemia panel, transfuse one unit     DVT prophylaxsis Lovenox   CODE STATUS DO NOT RESUSCITATE   Family Communication: Discussed in detail with the patient, all imaging results, lab results explained to the patient   Disposition Plan:   As above     Consultants:  Infectious disease  Cardiology    Procedures:   None  Antibiotics: Anti-infectives    Start     Dose/Rate Route Frequency Ordered Stop   08/12/16 2200  ceFAZolin (ANCEF) IVPB 2g/100 mL premix     2 g 200 mL/hr over 30 Minutes Intravenous Every 12 hours 08/12/16 1502 08/18/16 0959   08/12/16 0100  ceFAZolin (ANCEF) IVPB 2g/100 mL premix  Status:  Discontinued     2 g 200 mL/hr over 30 Minutes Intravenous Every 8 hours 08/11/16 2100 08/12/16 1502   08/11/16 2200  rifampin (RIFADIN) capsule 300 mg     300 mg Oral Every 12 hours 08/11/16 2100     08/11/16 1737  polymyxin B 500,000 Units, bacitracin 50,000 Units in sodium chloride 0.9 % 500 mL irrigation  Status:  Discontinued       As needed 08/11/16 1737 08/11/16 1822   08/11/16 1556  ceFAZolin (ANCEF) 2-4 GM/100ML-% IVPB    Comments:  Joneen Caraway   : cabinet override      08/11/16 1556 08/12/16 0359         HPI/Subjective: Feels weak and tired , denies cp,sob,  Objective: Vitals:   08/12/16 0420 08/12/16 1500 08/12/16 2044 08/13/16 0450  BP: (!) 113/48 (!) 108/50 (!) 111/50 (!) 122/52  Pulse: 67 82 79 81  Resp: 18 18 18 17   Temp: 97.6 F (36.4 C) 97.9 F (36.6 C) 98.5 F (36.9 C) 98.6 F (37 C)  TempSrc: Oral Oral Oral Oral  SpO2: 100% 100% 100% 100%  Weight:      Height:        Intake/Output Summary (Last 24 hours) at 08/13/16 0854 Last data filed at 08/13/16 0300  Gross  per 24 hour  Intake              730 ml  Output                0 ml  Net              730 ml    Exam:  Examination:  General exam: Appears calm and comfortable  Respiratory system: Clear to auscultation. Respiratory effort normal. Cardiovascular system: S1 & S2 heard, RRR. No JVD, murmurs, rubs, gallops or clicks. No pedal edema. Gastrointestinal system: Abdomen is nondistended, soft and nontender. No organomegaly or masses felt. Normal bowel sounds heard. Central nervous system: Alert and oriented. No focal neurological deficits. Extremities: Symmetric 5 x 5 power. Skin: No rashes, lesions or ulcers Psychiatry: Judgement and insight appear normal. Mood & affect appropriate.     Data Reviewed: I have personally reviewed following labs and imaging studies  Micro Results Recent Results (from the past 240 hour(s))  Culture, body fluid-bottle     Status: None (Preliminary result)   Collection Time: 08/11/16  1:46 PM  Result Value Ref Range Status   Specimen Description FLUID SYNOVIAL RIGHT KNEE  Final   Special Requests NONE  Final   Gram Stain   Final    GRAM POSITIVE COCCI IN CLUSTERS IN BOTH AEROBIC AND ANAEROBIC BOTTLES    Culture GRAM POSITIVE COCCI  Final   Report Status PENDING  Incomplete  Gram stain     Status: None   Collection Time: 08/11/16  1:46 PM  Result Value Ref Range Status   Specimen Description FLUID SYNOVIAL RIGHT KNEE  Final   Special Requests NONE  Final   Gram Stain   Final    ABUNDANT WBC PRESENT, PREDOMINANTLY PMN RARE INTRACELLULAR GRAM POSITIVE COCCI IN PAIRS    Report Status 08/11/2016 FINAL  Final  Blood Culture (routine x 2)     Status: None (Preliminary result)   Collection Time: 08/11/16  1:57 PM  Result Value Ref Range Status   Specimen Description BLOOD RIGHT ANTECUBITAL  Final   Special Requests IN PEDIATRIC BOTTLE Blood Culture adequate volume  Final   Culture NO GROWTH < 24 HOURS  Final   Report Status PENDING  Incomplete  Blood  Culture (routine x 2)     Status: None (Preliminary result)   Collection Time: 08/11/16  2:00 PM  Result Value Ref Range Status   Specimen Description BLOOD LEFT ANTECUBITAL  Final   Special Requests   Final    BOTTLES DRAWN AEROBIC AND ANAEROBIC Blood Culture adequate volume   Culture NO GROWTH < 24 HOURS  Final   Report Status PENDING  Incomplete  Aerobic/Anaerobic Culture (surgical/deep wound)     Status: None (Preliminary result)   Collection Time: 08/11/16  4:55 PM  Result Value Ref Range Status   Specimen Description KNEE RIGHT TISSUE  Final   Special Requests SPEC A  Final   Gram Stain   Final    FEW WBC PRESENT, PREDOMINANTLY PMN RARE GRAM POSITIVE COCCI IN PAIRS Gram Stain Report Called to,Read Back By and Verified With: A COLLINS,RN AT 1802 08/11/16 BY L BENFIELD    Culture   Final    FEW STAPHYLOCOCCUS AUREUS SUSCEPTIBILITIES TO FOLLOW    Report Status PENDING  Incomplete    Radiology Reports Dg Knee 1-2 Views Right  Result Date: 08/11/2016 CLINICAL DATA:  81 year old female with painful right total knee replacement. Swelling and pain for 2 days. EXAM: RIGHT KNEE - 1-2 VIEW COMPARISON:  None. FINDINGS: Large joint effusion evident on the cross-table lateral view. Total knee arthroplasty hardware appears intact and normally aligned. Patella appears intact. Dystrophic calcifications at the quadriceps tendon. No acute osseous abnormality identified. IMPRESSION: Large joint effusion. No acute osseous abnormality or adverse hardware features identified. Electronically Signed   By: Odessa Fleming M.D.   On: 08/11/2016 13:34   Dg Knee Right Port  Result Date: 08/11/2016 CLINICAL DATA:  S/P IRRIGATION AND DEBRIDEMENT KNEE WITH POLY EXCHANGE (Right Leg Upper) EXAM: PORTABLE RIGHT KNEE - 1-2 VIEW COMPARISON:  08/11/2016 FINDINGS: Status post right knee arthroplasty. Locules of gas are identified in the joint space and soft tissues. A surgical drain is identified in the joint space. There is no  acute fracture. IMPRESSION: Soft tissue gas and surgical drain following urination. Electronically Signed   By: Norva Pavlov M.D.   On: 08/11/2016 19:07     CBC  Recent Labs Lab 08/11/16 1100 08/12/16 0343 08/13/16 0559  WBC 8.0 7.9 7.2  HGB 11.3* 8.8* 7.3*  HCT 34.7* 27.5* 22.6*  PLT 165 157 160  MCV 93.0 92.3 91.5  MCH 30.3 29.5 29.6  MCHC 32.6 32.0 32.3  RDW 14.4 14.2 14.0  LYMPHSABS 0.8  --   --   MONOABS 0.6  --   --   EOSABS 0.1  --   --   BASOSABS 0.0  --   --     Chemistries   Recent Labs Lab 08/11/16 1100 08/12/16 0343  NA 137 134*  K 4.2 3.8  CL 103 104  CO2 24 22  GLUCOSE 121* 186*  BUN 17 23*  CREATININE 1.01* 1.42*  CALCIUM 9.3 8.5*  AST 16  --   ALT 8*  --   ALKPHOS 73  --   BILITOT 0.7  --    ------------------------------------------------------------------------------------------------------------------ estimated creatinine clearance is 26.6 mL/min (A) (by C-G formula based on SCr of 1.42 mg/dL (H)). ------------------------------------------------------------------------------------------------------------------ No results for input(s): HGBA1C in the last 72 hours. ------------------------------------------------------------------------------------------------------------------ No results for input(s): CHOL, HDL, LDLCALC, TRIG, CHOLHDL, LDLDIRECT in the last 72 hours. ------------------------------------------------------------------------------------------------------------------  Recent Labs  08/12/16 0343  TSH 0.995   ------------------------------------------------------------------------------------------------------------------ No results for input(s): VITAMINB12, FOLATE, FERRITIN, TIBC, IRON, RETICCTPCT in the last 72 hours.  Coagulation profile  Recent Labs Lab 08/11/16 1100  INR 1.00    No results for input(s): DDIMER in the last 72 hours.  Cardiac Enzymes No results for input(s): CKMB, TROPONINI, MYOGLOBIN in the  last 168 hours.  Invalid input(s): CK ------------------------------------------------------------------------------------------------------------------ Invalid input(s): POCBNP   CBG: No results for input(s): GLUCAP in the last 168 hours.     Studies: Dg Knee 1-2 Views Right  Result Date: 08/11/2016 CLINICAL DATA:  81 year old female with painful right total knee replacement. Swelling and pain for 2 days. EXAM: RIGHT KNEE - 1-2 VIEW COMPARISON:  None. FINDINGS: Large joint effusion evident on the cross-table lateral  view. Total knee arthroplasty hardware appears intact and normally aligned. Patella appears intact. Dystrophic calcifications at the quadriceps tendon. No acute osseous abnormality identified. IMPRESSION: Large joint effusion. No acute osseous abnormality or adverse hardware features identified. Electronically Signed   By: Odessa Fleming M.D.   On: 08/11/2016 13:34   Dg Knee Right Port  Result Date: 08/11/2016 CLINICAL DATA:  S/P IRRIGATION AND DEBRIDEMENT KNEE WITH POLY EXCHANGE (Right Leg Upper) EXAM: PORTABLE RIGHT KNEE - 1-2 VIEW COMPARISON:  08/11/2016 FINDINGS: Status post right knee arthroplasty. Locules of gas are identified in the joint space and soft tissues. A surgical drain is identified in the joint space. There is no acute fracture. IMPRESSION: Soft tissue gas and surgical drain following urination. Electronically Signed   By: Norva Pavlov M.D.   On: 08/11/2016 19:07      No results found for: HGBA1C Lab Results  Component Value Date   LDLCALC 144 (H) 12/07/2015   CREATININE 1.42 (H) 08/12/2016       Scheduled Meds: . acidophilus  1 capsule Oral QHS  . carvedilol  12.5 mg Oral BID WC  . docusate sodium  100 mg Oral BID  . enoxaparin (LOVENOX) injection  30 mg Subcutaneous Q24H  . feeding supplement (ENSURE ENLIVE)  237 mL Oral BID BM  . levothyroxine  112 mcg Oral QAC breakfast  . losartan  50 mg Oral Daily  . rifampin  300 mg Oral Q12H  . senna  2  tablet Oral QHS   Continuous Infusions: . sodium chloride 150 mL/hr at 08/12/16 0001  .  ceFAZolin (ANCEF) IV Stopped (08/12/16 2156)  . methocarbamol (ROBAXIN)  IV       LOS: 2 days    Time spent: >30 MINS    Richarda Overlie  [facility] Pager [phone]. If 7PM-7AM, please contact night-coverage at [email or web address], password [facility] 08/13/2016, 8:54 AM  LOS: 2 days

## 2016-08-13 NOTE — Plan of Care (Signed)
Problem: Pain Managment: Goal: General experience of comfort will improve Outcome: Progressing Educated pt on pain control. Explained to pt the reason for keeping pain at a manageable level and how to rate the pain on a pain scale. Pt understood. Will cont. To monitor pain level.

## 2016-08-13 NOTE — Progress Notes (Signed)
EP consult received for bacteremia in the setting of implanted device Dr Graciela Husbands knows patient well and is in the hospital tomorrow. TEE also scheduled for tomorrow and at that time would have more information. Will pass on for Dr Graciela Husbands to evaluate after TEE  Gypsy Balsam, NP 08/13/2016 2:59 PM

## 2016-08-13 NOTE — Progress Notes (Signed)
Physical Therapy Treatment Patient Details Name: [name] [name] MRN: [id number] DOB: Oct 17, 1935 Today's Date: 08/13/2016    History of Present Illness Pt is a 81 yo female admitted from [facility] hospital on 06/21/16. Pt was admitted to [facility] on 06/13/16 with a fever, nausea and vomiting and generalized weakness. Pt developed sepsis, hypotension and mild AKI and was diagnosed with MSSA bacteremia. Source is still in question, now considering leads from implaned defibrillator. PMH significant for lumbar surgery april, CHF, cardiomyopathy, hypothyroidism, biventricular ICD.  [name] Pt s/p I and D of R knee and poly exchange.    PT Comments    Pt requesting back to bed and RN unable to assist.  PTA educated patient on correct safety and technique during transfers to return to bed.  Will f/u next session with emphasis on gait and therapeutic exercise.    Follow Up Recommendations  Outpatient PT     Equipment Recommendations  None recommended by PT    Recommendations for Other Services       Precautions / Restrictions Precautions Precautions: Fall Restrictions Weight Bearing Restrictions: Yes RLE Weight Bearing: Weight bearing as tolerated    Mobility  Bed Mobility Overal bed mobility: Needs Assistance Bed Mobility: Sit to Supine     Supine to sit: Min assist     General bed mobility comments: Pt required assistance with RLE to lift against gravity.    Transfers Overall transfer level: Needs assistance Equipment used: Rolling walker (2 wheeled) Transfers: Sit to/from Stand Sit to Stand: Min guard         General transfer comment: increased time, good hand placement  Ambulation/Gait Ambulation/Gait assistance: Min guard Ambulation Distance (Feet):  (steps from chair back to bed.  ) Assistive device: Rolling walker (2 wheeled) Gait Pattern/deviations: Step-through pattern;Trunk flexed;Decreased stride length Gait velocity: decreased Gait velocity interpretation:  Below normal speed for age/gender General Gait Details: Pt remains slow and guarded and eager to return to bed after sitting in chair for 4 hours.  Pt remains to require cues for RW safety.     Stairs            Wheelchair Mobility    Modified Rankin (Stroke Patients Only)       Balance Overall balance assessment: Needs assistance Sitting-balance support: No upper extremity supported;Feet supported Sitting balance-Leahy Scale: Good       Standing balance-Leahy Scale: Fair Standing balance comment: Reliant on UE support                            Cognition Arousal/Alertness: Awake/alert Behavior During Therapy: WFL for tasks assessed/performed Overall Cognitive Status: Within Functional Limits for tasks assessed                                        Exercises      General Comments        Pertinent Vitals/Pain Pain Assessment: 0-10 Pain Score: 5  Pain Location: R knee; R goin Pain Descriptors / Indicators: Constant;Discomfort;Grimacing;Guarding Pain Intervention(s): Monitored during session;Repositioned    Home Living                      Prior Function            PT Goals (current goals can now be found in the care plan section) Acute Rehab PT Goals Patient  Stated Goal: Go home Potential to Achieve Goals: Good Progress towards PT goals: Progressing toward goals    Frequency    7X/week      PT Plan Current plan remains appropriate    Co-evaluation              AM-PAC PT "6 Clicks" Daily Activity  Outcome Measure  Difficulty turning over in bed (including adjusting bedclothes, sheets and blankets)?: A Lot Difficulty moving from lying on back to sitting on the side of the bed? : A Lot Difficulty sitting down on and standing up from a chair with arms (e.g., wheelchair, bedside commode, etc,.)?: A Little Help needed moving to and from a bed to chair (including a wheelchair)?: A Little Help needed  walking in hospital room?: A Little Help needed climbing 3-5 steps with a railing? : A Little 6 Click Score: 16    End of Session Equipment Utilized During Treatment: Gait belt Activity Tolerance: Patient tolerated treatment well Patient left: in chair;with call bell/phone within reach;with restraints reapplied Nurse Communication: Mobility status PT Visit Diagnosis: Unsteadiness on feet (R26.81);Difficulty in walking, not elsewhere classified (R26.2)     Time: 5520-8022 PT Time Calculation (min) (ACUTE ONLY): 9 min  Charges: $Therapeutic Activity: 8-22 mins                    G Codes:       Joycelyn Rua, PTA pager [phone]    Florestine Avers 08/13/2016, 5:36 PM

## 2016-08-14 ENCOUNTER — Encounter (HOSPITAL_COMMUNITY)
Admission: EM | Disposition: A | Discharge status: Other Acute Inpatient Hospital | Payer: Self-pay | Source: Home / Self Care | Attending: Internal Medicine

## 2016-08-14 ENCOUNTER — Inpatient Hospital Stay (HOSPITAL_COMMUNITY): Payer: Medicare Other

## 2016-08-14 ENCOUNTER — Encounter (HOSPITAL_COMMUNITY): Payer: Self-pay | Admitting: *Deleted

## 2016-08-14 DIAGNOSIS — M00069 Staphylococcal arthritis, unspecified knee: Secondary | ICD-10-CM

## 2016-08-14 DIAGNOSIS — T827XXA Infection and inflammatory reaction due to other cardiac and vascular devices, implants and grafts, initial encounter: Secondary | ICD-10-CM

## 2016-08-14 DIAGNOSIS — Z95810 Presence of automatic (implantable) cardiac defibrillator: Secondary | ICD-10-CM

## 2016-08-14 DIAGNOSIS — M00061 Staphylococcal arthritis, right knee: Secondary | ICD-10-CM

## 2016-08-14 DIAGNOSIS — R7881 Bacteremia: Secondary | ICD-10-CM

## 2016-08-14 DIAGNOSIS — B9561 Methicillin susceptible Staphylococcus aureus infection as the cause of diseases classified elsewhere: Secondary | ICD-10-CM

## 2016-08-14 DIAGNOSIS — I330 Acute and subacute infective endocarditis: Secondary | ICD-10-CM

## 2016-08-14 DIAGNOSIS — I351 Nonrheumatic aortic (valve) insufficiency: Secondary | ICD-10-CM

## 2016-08-14 DIAGNOSIS — T8453XD Infection and inflammatory reaction due to internal right knee prosthesis, subsequent encounter: Secondary | ICD-10-CM

## 2016-08-14 HISTORY — PX: TEE WITHOUT CARDIOVERSION: SHX5443

## 2016-08-14 LAB — SURGICAL PCR SCREEN
MRSA, PCR: NEGATIVE
STAPHYLOCOCCUS AUREUS: NEGATIVE

## 2016-08-14 LAB — CULTURE, BODY FLUID-BOTTLE

## 2016-08-14 LAB — COMPREHENSIVE METABOLIC PANEL
AST: 13 U/L — ABNORMAL LOW (ref 15–41)
Albumin: 2.5 g/dL — ABNORMAL LOW (ref 3.5–5.0)
Alkaline Phosphatase: 51 U/L (ref 38–126)
Anion gap: 7 (ref 5–15)
BUN: 22 mg/dL — ABNORMAL HIGH (ref 6–20)
CHLORIDE: 108 mmol/L (ref 101–111)
CO2: 25 mmol/L (ref 22–32)
CREATININE: 0.99 mg/dL (ref 0.44–1.00)
Calcium: 8.6 mg/dL — ABNORMAL LOW (ref 8.9–10.3)
GFR calc non Af Amer: 53 mL/min — ABNORMAL LOW (ref >60)
Glucose, Bld: 94 mg/dL (ref 65–99)
Potassium: 4.1 mmol/L (ref 3.5–5.1)
SODIUM: 140 mmol/L (ref 135–145)
Total Bilirubin: 0.9 mg/dL (ref 0.3–1.2)
Total Protein: 5.6 g/dL — ABNORMAL LOW (ref 6.5–8.1)

## 2016-08-14 LAB — TYPE AND SCREEN
ABO/RH(D): A POS
Antibody Screen: POSITIVE
DAT, IGG: NEGATIVE
Donor AG Type: NEGATIVE
UNIT DIVISION: 0

## 2016-08-14 LAB — CBC
HCT: 27.5 % — ABNORMAL LOW (ref 36.0–46.0)
Hemoglobin: 8.8 g/dL — ABNORMAL LOW (ref 12.0–15.0)
MCH: 29 pg (ref 26.0–34.0)
MCHC: 32 g/dL (ref 30.0–36.0)
MCV: 90.8 fL (ref 78.0–100.0)
PLATELETS: 189 10*3/uL (ref 150–400)
RBC: 3.03 MIL/uL — AB (ref 3.87–5.11)
RDW: 15.5 % (ref 11.5–15.5)
WBC: 4.5 10*3/uL (ref 4.0–10.5)

## 2016-08-14 LAB — CULTURE, BODY FLUID W GRAM STAIN -BOTTLE

## 2016-08-14 LAB — HCV COMMENT:

## 2016-08-14 LAB — BPAM RBC
Blood Product Expiration Date: 201808292359
ISSUE DATE / TIME: 201808071410
Unit Type and Rh: 6200

## 2016-08-14 LAB — HEPATITIS C ANTIBODY (REFLEX)

## 2016-08-14 SURGERY — ECHOCARDIOGRAM, TRANSESOPHAGEAL
Anesthesia: Moderate Sedation

## 2016-08-14 MED ORDER — FENTANYL CITRATE (PF) 100 MCG/2ML IJ SOLN
INTRAMUSCULAR | Status: DC | PRN
  Administered 2016-08-14: 25 ug via INTRAVENOUS

## 2016-08-14 MED ORDER — CEFAZOLIN SODIUM-DEXTROSE 2-4 GM/100ML-% IV SOLN
2.0000 g | Freq: Three times a day (TID) | INTRAVENOUS | Status: DC
  Administered 2016-08-14 – 2016-08-15 (×3): 2 g via INTRAVENOUS
  Filled 2016-08-14 (×4): qty 100

## 2016-08-14 MED ORDER — ENOXAPARIN SODIUM 40 MG/0.4ML ~~LOC~~ SOLN
40.0000 mg | SUBCUTANEOUS | Status: DC
  Administered 2016-08-15: 40 mg via SUBCUTANEOUS
  Filled 2016-08-14: qty 0.4

## 2016-08-14 MED ORDER — FENTANYL CITRATE (PF) 100 MCG/2ML IJ SOLN
INTRAMUSCULAR | Status: AC
  Filled 2016-08-14: qty 2

## 2016-08-14 MED ORDER — BUTAMBEN-TETRACAINE-BENZOCAINE 2-2-14 % EX AERO
INHALATION_SPRAY | CUTANEOUS | Status: DC | PRN
  Administered 2016-08-14: 2 via TOPICAL

## 2016-08-14 MED ORDER — MIDAZOLAM HCL 10 MG/2ML IJ SOLN
INTRAMUSCULAR | Status: DC | PRN
  Administered 2016-08-14: 1 mg via INTRAVENOUS

## 2016-08-14 MED ORDER — HYDROCODONE-ACETAMINOPHEN 5-325 MG PO TABS
1.0000 | ORAL_TABLET | ORAL | Status: DC | PRN
  Administered 2016-08-14 – 2016-08-15 (×3): 2 via ORAL
  Filled 2016-08-14 (×3): qty 2

## 2016-08-14 MED ORDER — MIDAZOLAM HCL 5 MG/ML IJ SOLN
INTRAMUSCULAR | Status: AC
  Filled 2016-08-14: qty 2

## 2016-08-14 NOTE — Progress Notes (Signed)
  Echocardiogram Echocardiogram Transesophageal has been performed.  Delcie Roch 08/14/2016, 5:46 PM

## 2016-08-14 NOTE — Progress Notes (Signed)
PROGRESS NOTE                                                                                                                                                                                                             Patient Demographics:    [name] [name], is a 81 y.o. female, DOB - 06-28-1935, RUE:[id number]  Admit date - 08/11/2016   Admitting Physician Jonah Blue, MD  Outpatient Primary MD for the patient is [name], MD  LOS - 3  Outpatient Specialists: Dr. Graciela Husbands  Chief Complaint  Patient presents with  . Knee Pain       Brief Narrative   81 year old female with history of congestive dilated cardiomyopathy, status post CRT Pacemaker in 06/2014, remote history of right TKA, hypothyroidism, history of lumbar fusion in 4/18, hospitalization from 6/7-18 with MSSA bacteremia. She completed antibiotic course on 7/5. She presented with right knee pain and stiffness which subsequently became painful. She had recent 2-D echo and TEE in [place] which were negative for vegetation. Patient underwent I&D of the joint with antibiotic poorly exchange by orthopedics.   Subjective:   Patient reports pain in her right knee has improved but has  occasional pain in the right groin.   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 EP consulted for removal of her pacemaker. Underwent I&D with poorly exchange and Synovial fluid culture growing MSSA. Continue rifampin and Ancef. Pain control as needed. Blood cultures negative. Will need PICC line placement.  Active Problems: Pacemaker endocarditis Given new finding with recurrent staph aureus bacteremia and septic arthritis she would  need pacemaker extraction and long-term antibiotics. EP consult pending.    Chronic systolic CHF (congestive heart failure), NYHA class 3 (HCC) Euvolemic. Continue Coreg.  Hypothyroidism  continue Synthroid.  Acute blood loss anemia/anemia of chronic disease Baseline hemoglobin around 10. Received 1 unit PRBC with improvement today.    Code Status : Full code  Family Communication  : husband at bedside  Disposition Plan  : Pending course  Barriers For Discharge : Active symptoms  Consults  :   ID Cardiology/EP [facility]  orthopedics   Procedures  :  I&D of right knee joint TEE  DVT Prophylaxis  :  Lovenox -  Lab Results  Component Value Date   PLT 189 08/14/2016    Antibiotics  :    Anti-infectives    Start     Dose/Rate Route Frequency Ordered Stop   08/14/16 1800  ceFAZolin (ANCEF) IVPB 2g/100 mL premix     2 g 200 mL/hr over 30 Minutes Intravenous Every 8 hours 08/14/16 1103     08/12/16 2200  ceFAZolin (ANCEF) IVPB 2g/100 mL premix  Status:  Discontinued     2 g 200 mL/hr over 30 Minutes Intravenous Every 12 hours 08/12/16 1502 08/14/16 1103   08/12/16 0100  ceFAZolin (ANCEF) IVPB 2g/100 mL premix  Status:  Discontinued     2 g 200 mL/hr over 30 Minutes Intravenous Every 8 hours 08/11/16 2100 08/12/16 1502   08/11/16 2200  rifampin (RIFADIN) capsule 300 mg     300 mg Oral Every 12 hours 08/11/16 2100     08/11/16 1737  polymyxin B 500,000 Units, bacitracin 50,000 Units in sodium chloride 0.9 % 500 mL irrigation  Status:  Discontinued       As needed 08/11/16 1737 08/11/16 1822   08/11/16 1556  ceFAZolin (ANCEF) 2-4 GM/100ML-% IVPB    Comments:  Joneen Caraway   : cabinet override      08/11/16 1556 08/12/16 0359        Objective:   Vitals:   08/14/16 1628 08/14/16 1633 08/14/16 1640 08/14/16 1652  BP: (!) 108/58 136/71 (!) 142/51 (!) 147/52  Pulse: 70 78 70 77  Resp: 15 (!) 23 18 13   Temp:   97.6 F (36.4 C)   TempSrc:   Oral   SpO2: 97% 98%  95% 98%  Weight:      Height:        Wt Readings from Last 3 Encounters:  08/11/16 68 kg (150 lb)  07/24/16 68 kg (150 lb)  07/11/16 70.8 kg (156 lb)     Intake/Output Summary (Last 24 hours) at 08/14/16 1811 Last data filed at 08/14/16 1800  Gross per 24 hour  Intake              554 ml  Output                0 ml  Net              554 ml     Physical Exam  Gen: not in distress HEENT: Pallor present, moist mucosa, supple neck Chest: clear b/l, no added sounds CVS: N S1&S2, no murmurs, rubs or gallop, pacemaker in place GI: soft, nondistended NT, bowel sounds present,  Musculoskeletal: warm, no edema     Data Review:    CBC  Recent Labs Lab 08/11/16 1100 08/12/16 0343 08/13/16 0559 08/14/16 0354  WBC 8.0 7.9 7.2 4.5  HGB 11.3* 8.8* 7.3* 8.8*  HCT 34.7* 27.5* 22.6* 27.5*  PLT 165 157 160 189  MCV 93.0 92.3 91.5 90.8  MCH 30.3 29.5 29.6 29.0  MCHC 32.6 32.0 32.3 32.0  RDW 14.4 14.2 14.0 15.5  LYMPHSABS 0.8  --   --   --   MONOABS 0.6  --   --   --   EOSABS 0.1  --   --   --   BASOSABS 0.0  --   --   --     Chemistries   Recent Labs Lab 08/11/16  1100 08/12/16 0343 08/14/16 0354  NA 137 134* 140  K 4.2 3.8 4.1  CL 103 104 108  CO2 24 22 25   GLUCOSE 121* 186* 94  BUN 17 23* 22*  CREATININE 1.01* 1.42* 0.99  CALCIUM 9.3 8.5* 8.6*  AST 16  --  13*  ALT 8*  --  <5*  ALKPHOS 73  --  51  BILITOT 0.7  --  0.9   ------------------------------------------------------------------------------------------------------------------ No results for input(s): CHOL, HDL, LDLCALC, TRIG, CHOLHDL, LDLDIRECT in the last 72 hours.  No results found for: HGBA1C ------------------------------------------------------------------------------------------------------------------  Recent Labs  08/12/16 0343  TSH 0.995   ------------------------------------------------------------------------------------------------------------------  Recent Labs  08/13/16 1120   VITAMINB12 311  FOLATE 6.1  FERRITIN 64  TIBC 274  IRON 18*  RETICCTPCT 2.7    Coagulation profile  Recent Labs Lab 08/11/16 1100  INR 1.00    No results for input(s): DDIMER in the last 72 hours.  Cardiac Enzymes No results for input(s): CKMB, TROPONINI, MYOGLOBIN in the last 168 hours.  Invalid input(s): CK ------------------------------------------------------------------------------------------------------------------    Component Value Date/Time   BNP 33.0 12/07/2015 0934    Inpatient Medications  Scheduled Meds: . acidophilus  1 capsule Oral QHS  . carvedilol  12.5 mg Oral BID WC  . docusate sodium  100 mg Oral BID  . [START ON 08/15/2016] enoxaparin (LOVENOX) injection  40 mg Subcutaneous Q24H  . feeding supplement (ENSURE ENLIVE)  237 mL Oral BID BM  . levothyroxine  112 mcg Oral QAC breakfast  . losartan  50 mg Oral Daily  . rifampin  300 mg Oral Q12H  . senna  2 tablet Oral QHS   Continuous Infusions: .  ceFAZolin (ANCEF) IV Stopped (08/14/16 1757)  . methocarbamol (ROBAXIN)  IV     PRN Meds:.acetaminophen **OR** acetaminophen, albuterol, alum & mag hydroxide-simeth, diphenhydrAMINE, furosemide, HYDROcodone-acetaminophen, HYDROmorphone (DILAUDID) injection, menthol-cetylpyridinium **OR** phenol, methocarbamol **OR** methocarbamol (ROBAXIN)  IV, methylphenidate, metoCLOPramide **OR** metoCLOPramide (REGLAN) injection, naphazoline-pheniramine, ondansetron **OR** ondansetron (ZOFRAN) IV, polyethylene glycol  Micro Results Recent Results (from the past 240 hour(s))  Culture, body fluid-bottle     Status: Abnormal   Collection Time: 08/11/16  1:46 PM  Result Value Ref Range Status   Specimen Description FLUID SYNOVIAL RIGHT KNEE  Final   Special Requests NONE  Final   Gram Stain   Final    GRAM POSITIVE COCCI IN CLUSTERS IN BOTH AEROBIC AND ANAEROBIC BOTTLES    Culture STAPHYLOCOCCUS AUREUS (A)  Final   Report Status 08/14/2016 FINAL  Final   Organism  ID, Bacteria STAPHYLOCOCCUS AUREUS  Final      Susceptibility   Staphylococcus aureus - MIC*    CIPROFLOXACIN <=0.5 SENSITIVE Sensitive     ERYTHROMYCIN >=8 RESISTANT Resistant     GENTAMICIN <=0.5 SENSITIVE Sensitive     OXACILLIN 0.5 SENSITIVE Sensitive     TETRACYCLINE <=1 SENSITIVE Sensitive     VANCOMYCIN <=0.5 SENSITIVE Sensitive     TRIMETH/SULFA <=10 SENSITIVE Sensitive     CLINDAMYCIN RESISTANT Resistant     RIFAMPIN <=0.5 SENSITIVE Sensitive     Inducible Clindamycin POSITIVE Resistant     * STAPHYLOCOCCUS AUREUS  Gram stain     Status: None   Collection Time: 08/11/16  1:46 PM  Result Value Ref Range Status   Specimen Description FLUID SYNOVIAL RIGHT KNEE  Final   Special Requests NONE  Final   Gram Stain   Final    ABUNDANT WBC PRESENT, PREDOMINANTLY PMN RARE INTRACELLULAR GRAM POSITIVE  COCCI IN PAIRS    Report Status 08/11/2016 FINAL  Final  Blood Culture (routine x 2)     Status: None (Preliminary result)   Collection Time: 08/11/16  1:57 PM  Result Value Ref Range Status   Specimen Description BLOOD RIGHT ANTECUBITAL  Final   Special Requests IN PEDIATRIC BOTTLE Blood Culture adequate volume  Final   Culture NO GROWTH 3 DAYS  Final   Report Status PENDING  Incomplete  Blood Culture (routine x 2)     Status: None (Preliminary result)   Collection Time: 08/11/16  2:00 PM  Result Value Ref Range Status   Specimen Description BLOOD LEFT ANTECUBITAL  Final   Special Requests   Final    BOTTLES DRAWN AEROBIC AND ANAEROBIC Blood Culture adequate volume   Culture NO GROWTH 3 DAYS  Final   Report Status PENDING  Incomplete  Aerobic/Anaerobic Culture (surgical/deep wound)     Status: None (Preliminary result)   Collection Time: 08/11/16  4:55 PM  Result Value Ref Range Status   Specimen Description KNEE RIGHT TISSUE  Final   Special Requests SPEC A  Final   Gram Stain   Final    FEW WBC PRESENT, PREDOMINANTLY PMN RARE GRAM POSITIVE COCCI IN PAIRS Gram Stain Report  Called to,Read Back By and Verified With: A COLLINS,RN AT 1802 08/11/16 BY L BENFIELD    Culture   Final    FEW STAPHYLOCOCCUS AUREUS NO ANAEROBES ISOLATED; CULTURE IN PROGRESS FOR 5 DAYS    Report Status PENDING  Incomplete   Organism ID, Bacteria STAPHYLOCOCCUS AUREUS  Final      Susceptibility   Staphylococcus aureus - MIC*    CIPROFLOXACIN <=0.5 SENSITIVE Sensitive     ERYTHROMYCIN >=8 RESISTANT Resistant     GENTAMICIN <=0.5 SENSITIVE Sensitive     OXACILLIN 0.5 SENSITIVE Sensitive     TETRACYCLINE <=1 SENSITIVE Sensitive     VANCOMYCIN <=0.5 SENSITIVE Sensitive     TRIMETH/SULFA <=10 SENSITIVE Sensitive     CLINDAMYCIN RESISTANT Resistant     RIFAMPIN <=0.5 SENSITIVE Sensitive     Inducible Clindamycin POSITIVE Resistant     * FEW STAPHYLOCOCCUS AUREUS  Surgical pcr screen     Status: None   Collection Time: 08/14/16  2:46 AM  Result Value Ref Range Status   MRSA, PCR NEGATIVE NEGATIVE Final   Staphylococcus aureus NEGATIVE NEGATIVE Final    Comment:        The Xpert SA Assay (FDA approved for NASAL specimens in patients over 68 years of age), is one component of a comprehensive surveillance program.  Test performance has been validated by [facility] for patients greater than or equal to 58 year old. It is not intended to diagnose infection nor to guide or monitor treatment.     Radiology Reports Dg Knee 1-2 Views Right  Result Date: 08/11/2016 CLINICAL DATA:  80 year old female with painful right total knee replacement. Swelling and pain for 2 days. EXAM: RIGHT KNEE - 1-2 VIEW COMPARISON:  None. FINDINGS: Large joint effusion evident on the cross-table lateral view. Total knee arthroplasty hardware appears intact and normally aligned. Patella appears intact. Dystrophic calcifications at the quadriceps tendon. No acute osseous abnormality identified. IMPRESSION: Large joint effusion. No acute osseous abnormality or adverse hardware features identified. Electronically  Signed   By: Odessa Fleming M.D.   On: 08/11/2016 13:34   Dg Knee Right Port  Result Date: 08/11/2016 CLINICAL DATA:  S/P IRRIGATION AND DEBRIDEMENT KNEE WITH POLY EXCHANGE (Right  Leg Upper) EXAM: PORTABLE RIGHT KNEE - 1-2 VIEW COMPARISON:  08/11/2016 FINDINGS: Status post right knee arthroplasty. Locules of gas are identified in the joint space and soft tissues. A surgical drain is identified in the joint space. There is no acute fracture. IMPRESSION: Soft tissue gas and surgical drain following urination. Electronically Signed   By: Norva Pavlov M.D.   On: 08/11/2016 19:07    Time Spent in minutes  25   Eddie North M.D on 08/14/2016 at 6:11 PM  Between 7am to 7pm - Pager - [phone]  After 7pm go to [email or web address] - password [facility]  [facility] -  Office  [phone]

## 2016-08-14 NOTE — Progress Notes (Signed)
PT Cancellation Note  Patient Details Name: ALLEX KASSON MRN: [id number] DOB: 11-17-1935   Cancelled Treatment:    Reason Eval/Treat Not Completed: (P) Patient at procedure or test/unavailable (Pt off unit for endoscopy and expected to return later this pm.   Will defer tx at this time.  )   Florestine Avers 08/14/2016, 5:17 PM Joycelyn Rua, PTA pager [phone]

## 2016-08-14 NOTE — Progress Notes (Signed)
      INFECTIOUS DISEASE ATTENDING ADDENDUM:   Date: 08/14/2016  [name]  Medical record number: [id number]  Date of birth: 07-24-1935    This patient has been seen and discussed with the house staff. Please see the resident's note for complete details. I concur with their findings with the following additions/corrections:  Patient is now complaining of right groin pain that has required narcotics..  She has a LARGE mobile vegetation on PM lead  To ensure maximum chance of CURE of endovascular infection and of preventing further seeing of other sites such as her opposite prosthetic knee I believe she SHOULD HAVE HER DEVICE EXPLANTED followed by 6 weeks of parenteral antibiotics prior to reimplantation  Dr. Ladona Ridgel is out of town for 2 weeks so this would need to happen via transfer to another facility.  continue Ancef and rifampin  With re to her PJI she would need 6 weeks of parenteral antibiotics followed by protracted oral abx with MINIMUM of 6 months and potentially longer depending upon her response to treatment, knee pain and inflammatory markers.   I DO NOT THINK THAT IV ABX followed by PO abx will be a successful strategy at suppressing her endovascular device infection ABSENT removal of the device  Her hip pain concerns me and I am worried that this site may also be seeded.  I will consider CT of the hip with contrast (unless her PM is MRI compatible)   I spent greater than 35  minutes with the patient including greater than 50% of time in face to face counsel of the patient and in coordination of their care with Cardiology.   [name] 08/14/2016, 7:15 PM

## 2016-08-14 NOTE — Care Management (Deleted)
[facility] is not able to provide service for patient, she is out of their area. Case manager is working with Jeri Modena, [facility] to arrange Home Health Agency.

## 2016-08-14 NOTE — Progress Notes (Signed)
Subjective:  Patient reports pain as mild to moderate.  C/o right hip and buttock pain starting last night. TEE and EP eval scheduled for today.  Objective:   VITALS:   Vitals:   08/13/16 1457 08/13/16 1813 08/13/16 2019 08/14/16 0428  BP: (!) 103/52 (!) 124/52 (!) 124/50 (!) 123/48  Pulse: 89 85 84 75  Resp:  16 16 16   Temp: 98.1 F (36.7 C) 98.6 F (37 C) 98.3 F (36.8 C) 97.7 F (36.5 C)  TempSrc: Oral Oral Oral Oral  SpO2: 99% 99% 99% 100%  Weight:      Height:        NAD ABD soft Sensation intact distally Intact pulses distally Dorsiflexion/Plantar flexion intact Incision: dressing C/D/I Compartment soft Wound VAC intact Painless logrolling of hip  L knee: no erythema, swelling, or effusion. NO evidence of infection.   Lab Results  Component Value Date   WBC 4.5 08/14/2016   HGB 8.8 (L) 08/14/2016   HCT 27.5 (L) 08/14/2016   MCV 90.8 08/14/2016   PLT 189 08/14/2016   BMET    Component Value Date/Time   NA 140 08/14/2016 0354   K 4.1 08/14/2016 0354   CL 108 08/14/2016 0354   CO2 25 08/14/2016 0354   GLUCOSE 94 08/14/2016 0354   BUN 22 (H) 08/14/2016 0354   CREATININE 0.99 08/14/2016 0354   CREATININE 1.11 (H) 12/07/2015 0934   CALCIUM 8.6 (L) 08/14/2016 0354   GFRNONAA 53 (L) 08/14/2016 0354   GFRAA >60 08/14/2016 0354     Recent Results (from the past 240 hour(s))  Culture, body fluid-bottle     Status: Abnormal (Preliminary result)   Collection Time: 08/11/16  1:46 PM  Result Value Ref Range Status   Specimen Description FLUID SYNOVIAL RIGHT KNEE  Final   Special Requests NONE  Final   Gram Stain   Final    GRAM POSITIVE COCCI IN CLUSTERS IN BOTH AEROBIC AND ANAEROBIC BOTTLES    Culture (A)  Final    STAPHYLOCOCCUS AUREUS SUSCEPTIBILITIES TO FOLLOW    Report Status PENDING  Incomplete  Gram stain     Status: None   Collection Time: 08/11/16  1:46 PM  Result Value Ref Range Status   Specimen Description FLUID SYNOVIAL RIGHT KNEE   Final   Special Requests NONE  Final   Gram Stain   Final    ABUNDANT WBC PRESENT, PREDOMINANTLY PMN RARE INTRACELLULAR GRAM POSITIVE COCCI IN PAIRS    Report Status 08/11/2016 FINAL  Final  Blood Culture (routine x 2)     Status: None (Preliminary result)   Collection Time: 08/11/16  1:57 PM  Result Value Ref Range Status   Specimen Description BLOOD RIGHT ANTECUBITAL  Final   Special Requests IN PEDIATRIC BOTTLE Blood Culture adequate volume  Final   Culture NO GROWTH 2 DAYS  Final   Report Status PENDING  Incomplete  Blood Culture (routine x 2)     Status: None (Preliminary result)   Collection Time: 08/11/16  2:00 PM  Result Value Ref Range Status   Specimen Description BLOOD LEFT ANTECUBITAL  Final   Special Requests   Final    BOTTLES DRAWN AEROBIC AND ANAEROBIC Blood Culture adequate volume   Culture NO GROWTH 2 DAYS  Final   Report Status PENDING  Incomplete  Aerobic/Anaerobic Culture (surgical/deep wound)     Status: None (Preliminary result)   Collection Time: 08/11/16  4:55 PM  Result Value Ref Range Status   Specimen  Description KNEE RIGHT TISSUE  Final   Special Requests SPEC A  Final   Gram Stain   Final    FEW WBC PRESENT, PREDOMINANTLY PMN RARE GRAM POSITIVE COCCI IN PAIRS Gram Stain Report Called to,Read Back By and Verified With: A COLLINS,RN AT 1802 08/11/16 BY L BENFIELD    Culture   Final    FEW STAPHYLOCOCCUS AUREUS NO ANAEROBES ISOLATED; CULTURE IN PROGRESS FOR 5 DAYS    Report Status PENDING  Incomplete   Organism ID, Bacteria STAPHYLOCOCCUS AUREUS  Final      Susceptibility   Staphylococcus aureus - MIC*    CIPROFLOXACIN <=0.5 SENSITIVE Sensitive     ERYTHROMYCIN >=8 RESISTANT Resistant     GENTAMICIN <=0.5 SENSITIVE Sensitive     OXACILLIN 0.5 SENSITIVE Sensitive     TETRACYCLINE <=1 SENSITIVE Sensitive     VANCOMYCIN <=0.5 SENSITIVE Sensitive     TRIMETH/SULFA <=10 SENSITIVE Sensitive     CLINDAMYCIN RESISTANT Resistant     RIFAMPIN <=0.5  SENSITIVE Sensitive     Inducible Clindamycin POSITIVE Resistant     * FEW STAPHYLOCOCCUS AUREUS  Surgical pcr screen     Status: None   Collection Time: 08/14/16  2:46 AM  Result Value Ref Range Status   MRSA, PCR NEGATIVE NEGATIVE Final   Staphylococcus aureus NEGATIVE NEGATIVE Final    Comment:        The Xpert SA Assay (FDA approved for NASAL specimens in patients over 12 years of age), is one component of a comprehensive surveillance program.  Test performance has been validated by [facility] for patients greater than or equal to 82 year old. It is not intended to diagnose infection nor to guide or monitor treatment.      Assessment/Plan: 3 Days Post-Op   Principal Problem:   Septic arthritis (HCC) Active Problems:   Chronic systolic CHF (congestive heart failure), NYHA class 3 (HCC)   Hypothyroidism   Biventricular ICD (implantable cardioverter-defibrillator) in place   S/P lumbar fusion   MSSA bacteremia   Infection of total right knee replacement (HCC)   Infected defibrillator (HCC)   Painful total knee replacement, right (HCC)   WBAT with walker Abx per ID: ancef and rifampin DVT ppx: lovenox, SCDs, tEDs PO pain control Follow cultures: MSSA R hip pain: likely positional from surgery / lying supine in bed w/ h/o LBP, low suspicion for septic arthritis due to painless ROM of hip, if worsens can consider MRI R hip to r/o effusion Dispo: will remove iVAC prior to d/c, needs long term IV abx   Chesnie Capell, Cloyde Reams 08/14/2016, 7:42 AM   Samson Frederic, MD Cell [phone]

## 2016-08-14 NOTE — Progress Notes (Signed)
PHARMACY NOTE:  ANTIMICROBIAL RENAL DOSAGE ADJUSTMENT  Current antimicrobial regimen includes a mismatch between antimicrobial dosage and estimated renal function.  As per policy approved by the Pharmacy & Therapeutics and Medical Executive Committees, the antimicrobial dosage will be adjusted accordingly.  Current antimicrobial dosage:  Ancef 2gm IV Q12H  Indication:  Septic arthritis with recent MSSA bacteremia, r/o endocarditis  Renal Function:  Estimated Creatinine Clearance: 38.2 mL/min (by C-G formula based on SCr of 0.99 mg/dL). []      On intermittent HD, scheduled: []      On CRRT    Antimicrobial dosage has been changed to:  Ancef 2gm IV Q8H  Additional comments:  remove stop date of 08/18/16 while patient is being worked-up.   Thank you for allowing pharmacy to be a part of this patient's care.   Tavoris Brisk D. Laney Potash, PharmD, BCPS Pager:  [phone] 08/14/2016, 11:01 AM

## 2016-08-14 NOTE — Consult Note (Signed)
ELECTROPHYSIOLOGY CONSULT NOTE  Patient ID: [name] [name], MRN: [id number], DOB/AGE: 08/29/1935 81 y.o. Admit date: 08/11/2016 Date of Consult: 08/14/2016  Primary Physician: [name], MD Primary Cardiologist: [facility] [name] [name] is a 81 y.o. female who is being seen today for the evaluation of staph infection at the request of Daiva Eves.   Chief Complaint: staph infection w pacer in place    HPI [name] [name] is a 81 y.o. female  Seen in consultation because of recurrent MSSA infection  She has a history of nonischemic cardiomyopathy left bundle branch block and nonsustained ventricular tachycardia. 6/16 she underwent CRT P-implantation with considerable functional improvement. 6/18 she developed  MSSA bacteremia in the context of orthopedic surgery i.e. a lumbar fusion 4/18.. Reportedly a TEE from [facility] was negative. She was treated with 6 weeks of antibiotics. Discussions at that time with ID suggested surveillance post antibiotic cultures  On this occasion she presented with a swollen knee. Aspiration demonstrated pus. She was taken for debridement with a polyliner exchange.   Past Medical History:  Diagnosis Date  . 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 Arthritis    "finger joints" (06/21/2016)  . Asthma   . CHF (congestive heart failure) (HCC)   . Congestive dilated cardiomyopathy (HCC) 03/31/2014   a. s/p Biotronik CRTD 06/2014  . GERD (gastroesophageal reflux disease)   . History of blood transfusion 1990s   "related to hysterectomy"  . Hypothyroidism   . Left bundle branch block   . MSSA bacteremia 06/21/2016  . Myocardial infarction (HCC)   . Pancreatitis, acute       Surgical History:  Past Surgical History:  Procedure Laterality Date  . ABDOMINAL HYSTERECTOMY    . APPENDECTOMY    . CARDIAC CATHETERIZATION    . CARDIAC DEFIBRILLATOR PLACEMENT   ?2016  . CATARACT EXTRACTION W/ INTRAOCULAR LENS  IMPLANT, BILATERAL Bilateral   . CHOLECYSTECTOMY OPEN     "related to pancreatitis"  . DILATION AND CURETTAGE OF UTERUS    . EP IMPLANTABLE DEVICE N/A 07/04/2014   Procedure: BiV ICD Insertion CRT-D;  Surgeon: Duke Salvia, MD;  Location: [facility] INVASIVE CV LAB;  Service: Cardiovascular;  Laterality: N/A;  . HERNIA REPAIR    . I&D KNEE WITH POLY EXCHANGE Right 08/11/2016   Procedure: IRRIGATION AND DEBRIDEMENT KNEE WITH POLY EXCHANGE;  Surgeon: Samson Frederic, MD;  Location: [facility] OR;  Service: Orthopedics;  Laterality: Right;  . INSERT / REPLACE / REMOVE PACEMAKER  ?2016  . JOINT REPLACEMENT Bilateral    "thumb joints; used my own material"  . JOINT REPLACEMENT    . LEFT HEART CATHETERIZATION WITH CORONARY ANGIOGRAM N/A 11/02/2013   Procedure: LEFT HEART CATHETERIZATION WITH CORONARY ANGIOGRAM;  Surgeon: Corky Crafts, MD;  Location: [facility] CATH LAB;  Service: Cardiovascular;  Laterality: N/A;  . LOOP RECORDER IMPLANT N/A 04/13/2014   Procedure: LOOP RECORDER IMPLANT;  Surgeon: Duke Salvia, MD;  Location: [facility] CATH LAB;  Service: Cardiovascular;  Laterality: N/A;  . LOOP RECORDER REMOVAL  ?2016  . POSTERIOR LUMBAR FUSION  05/03/2016   Lumbar Two-Three, Lumbar Three-Four Posterior Lumbar Fusion withLaminectomy and Foraminotomy  . SPINE SURGERY  04/2016   lumbar   . TONSILLECTOMY    . TOTAL HIP ARTHROPLASTY Bilateral   . UMBILICAL HERNIA REPAIR  1970s?     Home Meds: Prior to Admission  medications   Medication Sig Start Date End Date Taking? Authorizing Provider  albuterol (PROAIR HFA) 108 (90 Base) MCG/ACT inhaler Inhale 2 puffs into the lungs every 6 (six) hours as needed for wheezing or shortness of breath.   Yes [provider]  aspirin EC 81 MG tablet Take 81 mg by mouth daily.   Yes [provider]  aspirin-acetaminophen-caffeine (EXCEDRIN MIGRAINE) [phone] MG per tablet Take 1-2 tablets by mouth every 6 (six)  hours as needed for headache or migraine.    Yes [provider]  augmented betamethasone dipropionate (DIPROLENE-AF) 0.05 % ointment Apply 1 application topically daily as needed (acrodermatitis [place] on fingers/toes).    Yes [provider]  Calcium Carbonate Antacid (TUMS PO) Take 2 tablets by mouth daily as needed (indigestion).   Yes [provider]  carvedilol (COREG) 12.5 MG tablet Take 1 tablet (12.5 mg total) by mouth 2 (two) times daily. 12/19/15  Yes Lars Masson, MD  furosemide (LASIX) 20 MG tablet Take 1 tablet (20 mg total) by mouth daily as needed for edema. Patient taking differently: Take 20 mg by mouth daily as needed for fluid or edema.  09/07/15  Yes Lars Masson, MD  levothyroxine (SYNTHROID, LEVOTHROID) 112 MCG tablet Take 112 mcg by mouth daily before breakfast.   Yes [provider]  losartan (COZAAR) 50 MG tablet Take 1 tablet (50 mg total) by mouth daily. Patient taking differently: Take 25-50 mg by mouth See admin instructions. 25mg  daily Then take an additional 25mg  when BP elevated 07/16/16 10/14/16 Yes Lars Masson, MD  Melatonin 5 MG TABS Take 15 mg by mouth at bedtime as needed (for sleep.).    Yes [provider]  methocarbamol (ROBAXIN) 750 MG tablet Take 1 tablet (750 mg total) by mouth 2 (two) times daily as needed. For leg spasms. Patient taking differently: Take 750 mg by mouth 2 (two) times daily as needed for muscle spasms (leg spasms).  05/05/16  Yes Costella, Darci Current, PA-C  methylphenidate (RITALIN) 20 MG tablet Take 20 mg by mouth 2 (two) times daily as needed (narcolepsy).  02/22/16  Yes [provider]  Naphazoline-Pheniramine (ALLERGY EYE OP) Place 1-2 drops into both eyes daily as needed (for irritated eyes).   Yes [provider]  ondansetron (ZOFRAN) 4 MG tablet Take 0.5 tablets (2 mg total) by mouth every 8 (eight) hours as needed for nausea or vomiting. 12/19/15  Yes  Lars Masson, MD  Probiotic Product (PROBIOTIC PO) Take 1 capsule by mouth at bedtime.    Yes [provider]    Inpatient Medications:  . acidophilus  1 capsule Oral QHS  . carvedilol  12.5 mg Oral BID WC  . docusate sodium  100 mg Oral BID  . enoxaparin (LOVENOX) injection  30 mg Subcutaneous Q24H  . feeding supplement (ENSURE ENLIVE)  237 mL Oral BID BM  . levothyroxine  112 mcg Oral QAC breakfast  . losartan  50 mg Oral Daily  . rifampin  300 mg Oral Q12H  . senna  2 tablet Oral QHS      Allergies:  Allergies  Allergen Reactions  . Sulfa Antibiotics Hives    Social History   Social History  . Marital status: Married    Spouse name: N/A  . Number of children: N/A  . Years of education: N/A   Occupational History  . Not on file.   Social History Main Topics  . Smoking status: Never Smoker  .  Smokeless tobacco: Never Used     Comment: "smoked as a teenager; for maybe 1 wk"  . Alcohol use Yes     Comment: 6/15//2018 "glass of wine q 6 months or so"  . Drug use: No  . Sexual activity: Not on file   Other Topics Concern  . Not on file   Social History Narrative  . No narrative on file     Family History  Problem Relation Age of Onset  . Hyperlipidemia Mother   . Arrhythmia Mother        palpitations  . Heart disease Father   . Heart attack Father   . Heart attack Maternal Grandfather   . Heart attack Paternal Grandfather      ROS:  Please see the history of present illness.     All other systems reviewed and negative.    Physical Exam: Blood pressure (!) 123/48, pulse 75, temperature 97.7 F (36.5 C), temperature source Oral, resp. rate 16, height 4\' 10"  (1.473 m), weight 150 lb (68 kg), SpO2 100 %. General: Well developed, well nourished female in no acute distress. Head: Normocephalic, atraumatic, sclera non-icteric, no xanthomas, nares are without discharge. EENT: normal Lymph Nodes:  none Back: without scoliosis/kyphosis, no CVA  tendersness Neck: Negative for carotid bruits. JVD not elevated. Lungs: Clear bilaterally to auscultation without wheezes, rales, or rhonchi. Breathing is unlabored. Heart: RRR with S1 S2. 2/6  murmur , rubs, or gallops appreciated. Abdomen: Soft, non-tender, non-distended with normoactive bowel sounds. No hepatomegaly. No rebound/guarding. No obvious abdominal masses. Msk:  Strength and tone appear normal for age. Extremities: No clubbing or cyanosis. No  edema.  Distal pedal pulses are 2+ and equal bilaterally. Skin: Warm and Dry Neuro: Alert and oriented X 3. CN III-XII intact Grossly normal sensory and motor function . Psych:  Responds to questions appropriately with a normal affect.      Labs: Cardiac Enzymes No results for input(s): CKTOTAL, CKMB, TROPONINI in the last 72 hours. CBC Lab Results  Component Value Date   WBC 4.5 08/14/2016   HGB 8.8 (L) 08/14/2016   HCT 27.5 (L) 08/14/2016   MCV 90.8 08/14/2016   PLT 189 08/14/2016   PROTIME: No results for input(s): LABPROT, INR in the last 72 hours. Chemistry  Recent Labs Lab 08/14/16 0354  NA 140  K 4.1  CL 108  CO2 25  BUN 22*  CREATININE 0.99  CALCIUM 8.6*  PROT 5.6*  BILITOT 0.9  ALKPHOS 51  ALT <5*  AST 13*  GLUCOSE 94   Lipids Lab Results  Component Value Date   CHOL 231 (H) 12/07/2015   HDL 61 12/07/2015   LDLCALC 144 (H) 12/07/2015   TRIG 131 12/07/2015   BNP No results found for: PROBNP Thyroid Function Tests:  Recent Labs  08/12/16 0343  TSH 0.995      Miscellaneous No results found for: DDIMER  Radiology/Studies:  Dg Knee 1-2 Views Right  Result Date: 08/11/2016 CLINICAL DATA:  81 year old female with painful right total knee replacement. Swelling and pain for 2 days. EXAM: RIGHT KNEE - 1-2 VIEW COMPARISON:  None. FINDINGS: Large joint effusion evident on the cross-table lateral view. Total knee arthroplasty hardware appears intact and normally aligned. Patella appears intact.  Dystrophic calcifications at the quadriceps tendon. No acute osseous abnormality identified. IMPRESSION: Large joint effusion. No acute osseous abnormality or adverse hardware features identified. Electronically Signed   By: Odessa Fleming M.D.   On: 08/11/2016 13:34   Dg  Knee Right Port  Result Date: 08/11/2016 CLINICAL DATA:  S/P IRRIGATION AND DEBRIDEMENT KNEE WITH POLY EXCHANGE (Right Leg Upper) EXAM: PORTABLE RIGHT KNEE - 1-2 VIEW COMPARISON:  08/11/2016 FINDINGS: Status post right knee arthroplasty. Locules of gas are identified in the joint space and soft tissues. A surgical drain is identified in the joint space. There is no acute fracture. IMPRESSION: Soft tissue gas and surgical drain following urination. Electronically Signed   By: Norva Pavlov M.D.   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 The concern has been device infection not withstanding the "negative TEE "at [place] in June.  Having spoken with ID, I think is hard to imagine that the device is not the source of the recurrent seeding, and not withstanding that the blood cultures are negative.  Normally we would remove the device. The patient however has told me that infectious diseases has told her that she will need lifelong suppressive antibiotics. If this is the case and if the TEE were negative, I would like to talk with ID about the possibility of chronic antimicrobial suppression and avoid of the device extraction. The patient raises possibility. I find it intriguing.  Sherryl Manges

## 2016-08-14 NOTE — Progress Notes (Signed)
[email or web address]    Date of Admission:  08/11/2016   Total days of antibiotics 4        Day 4 of cefazolin        Day 4 of rifampin         Recommendations: - Continue cefazolin (8/5 - ) with rifampin 300 mg bid (8/5 - ) to treat MSSA infective endocarditis and prosthetic joint infection - Consider CT (or MRI if not contraindicated) of R hip with contrast to assess for septic arthritis should pain worsen or if there's clinical suspicion - Patient will need at least 6 weeks IV antibiotics to treat MSSA after device explantation, which will need to be completed before replacement with another permanent device. Plan for 6-12 months oral antibiotics after and potentially longer. Blood cultures showing no growth at 3 days. - Patient will need to have pacemaker removed to allow for MSSA clearance. Plan to have patient transferred to [facility] for operative management   Assessment: #Septic Arthritis, Infective Endocarditis TEE suggestive of infective endocarditis with large vegetation on right atrial lead of pacemaker. Plan for transfer to [facility] for cardiac device removal. Patient is on ancef and rifampin to treat MSSA infective endocarditis and prosthetic joint infection. Blood cultures negative at 3 days x2. Patient will need to have cardiac devices replaced to allow for clearance of MSSA.[name] She will need 6 weeks of IV antibiotics, will continue ancef and added on rifampin for possible prosthetic joint infection, which may also need longer-term oral antibiotics. Patient endorsing intermittent severe R hip pain but is not reproducible with movement of hip, less suggestive of septic arthritis. Can consider R hip CT with contrast or MRI if prostheses are compatible to assess.  Principal Problem:   Septic arthritis (HCC) Active Problems:   Chronic systolic CHF (congestive heart failure), NYHA class 3 (HCC)   Hypothyroidism   Biventricular ICD (implantable cardioverter-defibrillator) in place   S/P lumbar fusion  MSSA bacteremia   Infection of total right knee replacement (HCC)   Infected defibrillator (HCC)   Painful total knee replacement, right (HCC)   . acidophilus  1 capsule Oral QHS  . carvedilol  12.5 mg Oral BID WC  . docusate sodium  100 mg Oral BID  . enoxaparin (LOVENOX) injection  40 mg Subcutaneous Q24H  . feeding supplement (ENSURE ENLIVE)  237 mL Oral BID BM  . levothyroxine  112 mcg Oral QAC breakfast  . losartan  50 mg Oral Daily  . rifampin  300 mg Oral Q12H  . senna  2 tablet Oral QHS    SUBJECTIVE: Had another episode of R hip/groin pain, states that she also feels some "outpouching" at R groin area. Pain has subsided this morning. No chills chest pain, shortness of breath, headache, n/v, abdominal pain.    Review of Systems: Review of Systems  All other systems reviewed and are negative.   Allergies  Allergen Reactions  . Sulfa Antibiotics Hives    OBJECTIVE: Vitals:   08/14/16 1640 08/14/16 1652 08/14/16 2034 08/15/16 0500  BP: (!) 142/51 (!) 147/52 (!) 111/53 (!) 129/58  Pulse: 70 77 77 75  Resp: 18 13 18 20   Temp: 97.6 F (36.4 C)  98.4 F (36.9 C) 98 F (36.7 C)  TempSrc: Oral  Oral Oral  SpO2: 95% 98% 95% 97%  Weight:      Height:       Body mass index is 31.35 kg/m.  Physical Exam  Constitutional: She is oriented to person, place, and  time and well-developed, well-nourished, and in no distress.  HENT:  Head: Normocephalic and atraumatic.  Eyes: EOM are normal. No scleral icterus.  Neck: Neck supple.  Cardiovascular: Normal rate, regular rhythm and normal heart sounds.   No murmur heard. Pulmonary/Chest: Effort normal and breath sounds normal. No respiratory distress. She has no wheezes. She has no rales.  Abdominal: Soft. Bowel sounds are normal. She exhibits no distension. There is no tenderness.  Musculoskeletal: She exhibits no edema.  Patient can wiggle toes and sensation intact in R foot. Wound vac in place, R knee/lower leg dressed  and placed in splint. No pain on R hip flexion  Neurological: She is alert and oriented to person, place, and time.  Skin: Skin is warm and dry. No rash noted. No erythema.  Psychiatric: Mood and affect normal.    Lab Results Lab Results  Component Value Date   WBC 4.5 08/14/2016   HGB 8.8 (L) 08/14/2016   HCT 27.5 (L) 08/14/2016   MCV 90.8 08/14/2016   PLT 189 08/14/2016    Lab Results  Component Value Date   CREATININE 0.78 08/15/2016   BUN 12 08/15/2016   NA 139 08/15/2016   K 4.9 08/15/2016   CL 104 08/15/2016   CO2 27 08/15/2016    Lab Results  Component Value Date   ALT <5 (L) 08/14/2016   AST 13 (L) 08/14/2016   ALKPHOS 51 08/14/2016   BILITOT 0.9 08/14/2016     Microbiology: Recent Results (from the past 240 hour(s))  Culture, body fluid-bottle     Status: Abnormal   Collection Time: 08/11/16  1:46 PM  Result Value Ref Range Status   Specimen Description FLUID SYNOVIAL RIGHT KNEE  Final   Special Requests NONE  Final   Gram Stain   Final    GRAM POSITIVE COCCI IN CLUSTERS IN BOTH AEROBIC AND ANAEROBIC BOTTLES    Culture STAPHYLOCOCCUS AUREUS (A)  Final   Report Status 08/14/2016 FINAL  Final   Organism ID, Bacteria STAPHYLOCOCCUS AUREUS  Final      Susceptibility   Staphylococcus aureus - MIC*    CIPROFLOXACIN <=0.5 SENSITIVE Sensitive     ERYTHROMYCIN >=8 RESISTANT Resistant     GENTAMICIN <=0.5 SENSITIVE Sensitive     OXACILLIN 0.5 SENSITIVE Sensitive     TETRACYCLINE <=1 SENSITIVE Sensitive     VANCOMYCIN <=0.5 SENSITIVE Sensitive     TRIMETH/SULFA <=10 SENSITIVE Sensitive     CLINDAMYCIN RESISTANT Resistant     RIFAMPIN <=0.5 SENSITIVE Sensitive     Inducible Clindamycin POSITIVE Resistant     * STAPHYLOCOCCUS AUREUS  Gram stain     Status: None   Collection Time: 08/11/16  1:46 PM  Result Value Ref Range Status   Specimen Description FLUID SYNOVIAL RIGHT KNEE  Final   Special Requests NONE  Final   Gram Stain   Final    ABUNDANT WBC  PRESENT, PREDOMINANTLY PMN RARE INTRACELLULAR GRAM POSITIVE COCCI IN PAIRS    Report Status 08/11/2016 FINAL  Final  Blood Culture (routine x 2)     Status: None (Preliminary result)   Collection Time: 08/11/16  1:57 PM  Result Value Ref Range Status   Specimen Description BLOOD RIGHT ANTECUBITAL  Final   Special Requests IN PEDIATRIC BOTTLE Blood Culture adequate volume  Final   Culture NO GROWTH 3 DAYS  Final   Report Status PENDING  Incomplete  Blood Culture (routine x 2)     Status: None (Preliminary result)  Collection Time: 08/11/16  2:00 PM  Result Value Ref Range Status   Specimen Description BLOOD LEFT ANTECUBITAL  Final   Special Requests   Final    BOTTLES DRAWN AEROBIC AND ANAEROBIC Blood Culture adequate volume   Culture NO GROWTH 3 DAYS  Final   Report Status PENDING  Incomplete  Aerobic/Anaerobic Culture (surgical/deep wound)     Status: None (Preliminary result)   Collection Time: 08/11/16  4:55 PM  Result Value Ref Range Status   Specimen Description KNEE RIGHT TISSUE  Final   Special Requests SPEC A  Final   Gram Stain   Final    FEW WBC PRESENT, PREDOMINANTLY PMN RARE GRAM POSITIVE COCCI IN PAIRS Gram Stain Report Called to,Read Back By and Verified With: A COLLINS,RN AT 1802 08/11/16 BY L BENFIELD    Culture   Final    FEW STAPHYLOCOCCUS AUREUS NO ANAEROBES ISOLATED; CULTURE IN PROGRESS FOR 5 DAYS    Report Status PENDING  Incomplete   Organism ID, Bacteria STAPHYLOCOCCUS AUREUS  Final      Susceptibility   Staphylococcus aureus - MIC*    CIPROFLOXACIN <=0.5 SENSITIVE Sensitive     ERYTHROMYCIN >=8 RESISTANT Resistant     GENTAMICIN <=0.5 SENSITIVE Sensitive     OXACILLIN 0.5 SENSITIVE Sensitive     TETRACYCLINE <=1 SENSITIVE Sensitive     VANCOMYCIN <=0.5 SENSITIVE Sensitive     TRIMETH/SULFA <=10 SENSITIVE Sensitive     CLINDAMYCIN RESISTANT Resistant     RIFAMPIN <=0.5 SENSITIVE Sensitive     Inducible Clindamycin POSITIVE Resistant     * FEW  STAPHYLOCOCCUS AUREUS  Surgical pcr screen     Status: None   Collection Time: 08/14/16  2:46 AM  Result Value Ref Range Status   MRSA, PCR NEGATIVE NEGATIVE Final   Staphylococcus aureus NEGATIVE NEGATIVE Final    Comment:        The Xpert SA Assay (FDA approved for NASAL specimens in patients over 51 years of age), is one component of a comprehensive surveillance program.  Test performance has been validated by [facility] for patients greater than or equal to 48 year old. It is not intended to diagnose infection nor to guide or monitor treatment.     Will Analie Katzman, MS4 08/15/2016, 8:47 AM

## 2016-08-14 NOTE — Interval H&P Note (Signed)
History and Physical Interval Note:  08/14/2016 1:29 PM  [name] [name]  has presented today for surgery, with the diagnosis of BACTEREMIA  The various methods of treatment have been discussed with the patient and family. After consideration of risks, benefits and other options for treatment, the patient has consented to  Procedure(s): TRANSESOPHAGEAL ECHOCARDIOGRAM (TEE) (N/A) as a surgical intervention .  The patient's history has been reviewed, patient examined, no change in status, stable for surgery.  I have reviewed the patient's chart and labs.  Questions were answered to the patient's satisfaction.     Tobias Alexander

## 2016-08-14 NOTE — H&P (View-Only) (Signed)
EP consult received for bacteremia in the setting of implanted device Dr Klein knows patient well and is in the hospital tomorrow. TEE also scheduled for tomorrow and at that time would have more information. Will pass on for Dr Klein to evaluate after TEE  Amber Seiler, NP 08/13/2016 2:59 PM   

## 2016-08-14 NOTE — CV Procedure (Signed)
     Transesophageal Echocardiogram Note  [name] [name] [id number] January 01, 1936  Procedure: Transesophageal Echocardiogram Indications: Bacteremia  Procedure Details Consent: Obtained Time Out: Verified patient identification, verified procedure, site/side was marked, verified correct patient position, special equipment/implants available, Radiology Safety Procedures followed,  medications/allergies/relevent history reviewed, required imaging and test results available.  Performed  Medications: Fentanyl: 50 mcg Versed: 5 mg  Left ventricle: Systolic function was normal. The estimated   ejection fraction was in the range of 55% to 60%. Wall motion was   normal; there were no regional wall motion abnormalities. - Aortic valve: No evidence of vegetation. There was trivial   regurgitation. - Mitral valve: There was mild regurgitation. - Left atrium: No evidence of thrombus in the atrial cavity or   appendage. No evidence of thrombus in the atrial cavity or   appendage. - Right atrium: No evidence of thrombus in the atrial cavity or   appendage. No evidence of thrombus in the atrial cavity or   appendage. - Tricuspid valve: No evidence of vegetation. - Pulmonic valve: No evidence of vegetation.  Impressions:  - There is a highly mobile echodensity measuring 13 x 3 mm attached   to the right atrial lead in the right atrium at the tricuspid   valve level. This is consistent with pacemaker endocarditis.  Complications: No apparent complications Patient did tolerate procedure well.  Tobias Alexander, MD, [facility] 08/14/2016, 6:03 PM

## 2016-08-15 DIAGNOSIS — I5022 Chronic systolic (congestive) heart failure: Secondary | ICD-10-CM

## 2016-08-15 DIAGNOSIS — T827XXD Infection and inflammatory reaction due to other cardiac and vascular devices, implants and grafts, subsequent encounter: Secondary | ICD-10-CM

## 2016-08-15 DIAGNOSIS — R1031 Right lower quadrant pain: Secondary | ICD-10-CM

## 2016-08-15 DIAGNOSIS — I428 Other cardiomyopathies: Secondary | ICD-10-CM

## 2016-08-15 DIAGNOSIS — D638 Anemia in other chronic diseases classified elsewhere: Secondary | ICD-10-CM

## 2016-08-15 DIAGNOSIS — M009 Pyogenic arthritis, unspecified: Secondary | ICD-10-CM

## 2016-08-15 DIAGNOSIS — Z950 Presence of cardiac pacemaker: Secondary | ICD-10-CM | POA: Insufficient documentation

## 2016-08-15 DIAGNOSIS — M00071 Staphylococcal arthritis, right ankle and foot: Secondary | ICD-10-CM

## 2016-08-15 LAB — BASIC METABOLIC PANEL
Anion gap: 8 (ref 5–15)
BUN: 12 mg/dL (ref 6–20)
CHLORIDE: 104 mmol/L (ref 101–111)
CO2: 27 mmol/L (ref 22–32)
Calcium: 8.9 mg/dL (ref 8.9–10.3)
Creatinine, Ser: 0.78 mg/dL (ref 0.44–1.00)
GFR calc Af Amer: >60 mL/min (ref >60)
GFR calc non Af Amer: >60 mL/min (ref >60)
GLUCOSE: 97 mg/dL (ref 65–99)
Potassium: 4.9 mmol/L (ref 3.5–5.1)
Sodium: 139 mmol/L (ref 135–145)

## 2016-08-15 MED ORDER — DOCUSATE SODIUM 100 MG PO CAPS: 100.0000 mg | ORAL_CAPSULE | Freq: Two times a day (BID) | ORAL | 0 refills | Status: DC

## 2016-08-15 MED ORDER — PHENOL 1.4 % MT LIQD: 1.0000 | OROMUCOSAL | 0 refills | Status: DC | PRN

## 2016-08-15 MED ORDER — HYDROMORPHONE HCL 1 MG/ML IJ SOLN: 0.5000 mg | INTRAMUSCULAR | 0 refills | Status: DC | PRN

## 2016-08-15 MED ORDER — RIFAMPIN 300 MG PO CAPS: 300.0000 mg | ORAL_CAPSULE | Freq: Two times a day (BID) | ORAL | 0 refills | Status: DC

## 2016-08-15 MED ORDER — DIPHENHYDRAMINE HCL 12.5 MG/5ML PO ELIX: 12.5000 mg | ORAL_SOLUTION | ORAL | 0 refills | Status: DC | PRN

## 2016-08-15 MED ORDER — ALUM & MAG HYDROXIDE-SIMETH 200-200-20 MG/5ML PO SUSP: 30.0000 mL | ORAL | 0 refills | Status: DC | PRN

## 2016-08-15 MED ORDER — ONDANSETRON HCL 4 MG PO TABS: 4.0000 mg | ORAL_TABLET | Freq: Four times a day (QID) | ORAL | 0 refills | Status: DC | PRN

## 2016-08-15 MED ORDER — SENNA 8.6 MG PO TABS: 2.0000 | ORAL_TABLET | Freq: Every day | ORAL | 0 refills | Status: DC

## 2016-08-15 MED ORDER — METHOCARBAMOL 500 MG PO TABS: 500.0000 mg | ORAL_TABLET | Freq: Four times a day (QID) | ORAL | 0 refills | Status: AC | PRN

## 2016-08-15 MED ORDER — METOCLOPRAMIDE HCL 5 MG PO TABS: 5.0000 mg | ORAL_TABLET | Freq: Three times a day (TID) | ORAL | 0 refills | Status: DC | PRN

## 2016-08-15 MED ORDER — HYDROMORPHONE HCL 1 MG/ML IJ SOLN: 0.5000 mg | INTRAMUSCULAR | Status: DC | PRN

## 2016-08-15 MED ORDER — ENSURE ENLIVE PO LIQD: 237.0000 mL | Freq: Two times a day (BID) | ORAL | 12 refills | Status: AC

## 2016-08-15 MED ORDER — HYDROCODONE-ACETAMINOPHEN 5-325 MG PO TABS: 1.0000 | ORAL_TABLET | ORAL | 0 refills | Status: DC | PRN

## 2016-08-15 MED ORDER — POLYETHYLENE GLYCOL 3350 17 G PO PACK: 17.0000 g | PACK | Freq: Every day | ORAL | 0 refills | Status: AC | PRN

## 2016-08-15 MED ORDER — MENTHOL 3 MG MT LOZG: 1.0000 | LOZENGE | OROMUCOSAL | 12 refills | Status: DC | PRN

## 2016-08-15 MED ORDER — CEFAZOLIN SODIUM-DEXTROSE 2-4 GM/100ML-% IV SOLN: 2.0000 g | Freq: Three times a day (TID) | INTRAVENOUS | 0 refills | Status: DC

## 2016-08-15 NOTE — Discharge Summary (Addendum)
Physician Discharge Summary  MARCELE KOSTA ZOX:[id number] DOB: [birth year] DOA: 08/11/2016  PCP: [name], MD  Admit date: 08/11/2016 Discharge date: 08/15/2016  Admitted From: home Disposition:  Transfer to [facility] ( accepting physician : Dr Gary Fleet , EP)  Recommendations for Outpatient Follow-up:  1. Discharge to [facility]. 2. Patient will need either an MRI ( if device compatible) ot CT of her hip with contrast given new onset pain over her rt hip area to r/o effusion/ rt hip septic arthritis.  Antibiotics start date: 08/11/2016    Discharge Condition: guarded CODE STATUS: DNR Diet recommendation: Heart Healthy     Discharge Diagnoses:  Principal Problem:   Infected defibrillator [facility])   Active Problems:   Chronic systolic CHF (congestive heart failure), NYHA class 3 (HCC)   Hypothyroidism   Nonischemic cardiomyopathy (HCC)   Biventricular ICD (implantable cardioverter-defibrillator) in place   S/P lumbar fusion   MSSA bacteremia   Infection of total right knee replacement (HCC)   Septic arthritis (HCC)   Painful total knee replacement, right (HCC)   Anemia in other chronic diseases classified elsewhere   Right groin pain  Brief narrative/ History of presenting illness 81 year old female with history of congestive dilated cardiomyopathy, status post CRT Pacemaker in 06/2014, remote history of right TKA, hypothyroidism, history of lumbar fusion in 4/18, hospitalization from 6/7-18 with MSSA bacteremia. She completed antibiotic course on 7/5. She presented with right knee pain and stiffness which subsequently became painful. She had recent 2-D echo and TEE in [place] which were negative for vegetation. Patient underwent I&D of the joint with antibiotic poorly exchange by orthopedics. A TEE done on 8/7 showed infected ICD.  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 EP consulted for removal of her pacemaker. Since Dr Ladona Ridgel who normally does device extraction is out of office , [facility] as consulted who readily accepted the patient to be transferred there. [facility].  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 -patient underwent I&D of right knee joint  with poly-exchange on 8/6. Synovial fluid culture growing MSSA.  Continue rifampin and Ancef based on sensitivity. . Pain control as needed. Blood cultures negative.   Active Problems: Right hip/ groin pain  noted since 8/8. Possible for septic arthritis +/- effusion. patient will need either MRI of the hip ( if ICD compatible) or CT with contrast to r/o septic joint/ effusion.   Chronic systolic CHF (congestive heart failure), NYHA class 3 (HCC) Euvolemic. Continue ASA ,  Coreg and losartan.  Hypothyroidism  continue Synthroid.  Acute blood loss anemia/anemia of chronic disease Baseline hemoglobin around 10. Received 1 unit PRBC on 8/6 with improvement.  Acute kidney injury Mild on admission secondary to sepsis . Resolved within 24 hrs with IV fluids.  DVT prophylaxis: sq lovenox   Family Communication  : husband at bedside  Disposition Plan  : discharge to [facility]  Consults  :   ID Cardiology/EP [place] [facility]   Procedures  :  I&D of right knee joint TEE  Discharge Instructions   Allergies as of 08/15/2016      Reactions   Sulfa Antibiotics Hives      Medication List    TAKE these medications   ALLERGY  EYE OP Place 1-2 drops into both eyes daily as needed (for irritated eyes).   alum & mag hydroxide-simeth 200-200-20 MG/5ML suspension Commonly known as:  MAALOX/MYLANTA Take 30 mLs by mouth every 4 (four) hours as needed for  indigestion.   aspirin EC 81 MG tablet Take 81 mg by mouth daily.   aspirin-acetaminophen-caffeine 250-250-65 MG tablet Commonly known as:  EXCEDRIN MIGRAINE Take 1-2 tablets by mouth every 6 (six) hours as needed for headache or migraine.   augmented betamethasone dipropionate 0.05 % ointment Commonly known as:  DIPROLENE-AF Apply 1 application topically daily as needed (acrodermatitis [place] on fingers/toes).   carvedilol 12.5 MG tablet Commonly known as:  COREG Take 1 tablet (12.5 mg total) by mouth 2 (two) times daily.   ceFAZolin 2-4 GM/100ML-% IVPB Commonly known as:  ANCEF Inject 100 mLs (2 g total) into the vein every 8 (eight) hours.   diphenhydrAMINE 12.5 MG/5ML elixir Commonly known as:  BENADRYL Take 5-10 mLs (12.5-25 mg total) by mouth every 4 (four) hours as needed for itching.   docusate sodium 100 MG capsule Commonly known as:  COLACE Take 1 capsule (100 mg total) by mouth 2 (two) times daily.   feeding supplement (ENSURE ENLIVE) Liqd Take 237 mLs by mouth 2 (two) times daily between meals.   furosemide 20 MG tablet Commonly known as:  LASIX Take 1 tablet (20 mg total) by mouth daily as needed for edema. What changed:  reasons to take this   HYDROcodone-acetaminophen 5-325 MG tablet Commonly known as:  NORCO/VICODIN Take 1-2 tablets by mouth every 4 (four) hours as needed for moderate pain or severe pain.   HYDROmorphone 1 MG/ML injection Commonly known as:  DILAUDID Inject 0.5-2 mLs (0.5-2 mg total) into the vein every 2 (two) hours as needed for severe pain (unresolved breakthrough pain).   levothyroxine 112 MCG tablet Commonly known as:  SYNTHROID, LEVOTHROID Take 112 mcg by mouth daily before breakfast.   losartan 50 MG tablet Commonly known as:  COZAAR Take 1 tablet (50 mg total) by mouth daily. What changed:  how much to take  when to take this  additional instructions   Melatonin 5 MG Tabs Take 15 mg by mouth at bedtime as needed  (for sleep.).   menthol-cetylpyridinium 3 MG lozenge Commonly known as:  CEPACOL Take 1 lozenge (3 mg total) by mouth as needed for sore throat (sore throat).   methocarbamol 500 MG tablet Commonly known as:  ROBAXIN Take 1 tablet (500 mg total) by mouth every 6 (six) hours as needed for muscle spasms. What changed:  medication strength  how much to take  when to take this  reasons to take this  additional instructions   methylphenidate 20 MG tablet Commonly known as:  RITALIN Take 20 mg by mouth 2 (two) times daily as needed (narcolepsy).   metoCLOPramide 5 MG tablet Commonly known as:  REGLAN Take 1-2 tablets (5-10 mg total) by mouth every 8 (eight) hours as needed for nausea (if ondansetron (ZOFRAN) ineffective.).   ondansetron 4 MG tablet Commonly known as:  ZOFRAN Take 0.5 tablets (2 mg total) by mouth every 8 (eight) hours as needed for nausea or vomiting. What changed:  Another medication with the same name was added. Make sure you understand how and when to take each.   ondansetron 4 MG tablet Commonly known as:  ZOFRAN Take 1 tablet (4 mg total) by mouth every 6 (six) hours as needed for nausea. What changed:  You were already  taking a medication with the same name, and this prescription was added. Make sure you understand how and when to take each.   phenol 1.4 % Liqd Commonly known as:  CHLORASEPTIC Use as directed 1 spray in the mouth or throat as needed for throat irritation / pain.   polyethylene glycol packet Commonly known as:  MIRALAX / GLYCOLAX Take 17 g by mouth daily as needed for mild constipation.   PROAIR HFA 108 (90 Base) MCG/ACT inhaler Generic drug:  albuterol Inhale 2 puffs into the lungs every 6 (six) hours as needed for wheezing or shortness of breath.   PROBIOTIC PO Take 1 capsule by mouth at bedtime.   rifampin 300 MG capsule Commonly known as:  RIFADIN Take 1 capsule (300 mg total) by mouth every 12 (twelve) hours.   senna 8.6  MG Tabs tablet Commonly known as:  SENOKOT Take 2 tablets (17.2 mg total) by mouth at bedtime.   TUMS PO Take 2 tablets by mouth daily as needed (indigestion).            Durable Medical Equipment        Start     Ordered   08/13/16 1255  For home use only DME Walker rolling  Once    Comments:  Needs youth walker  Question:  Patient needs a walker to treat with the following condition  Answer:  Arthritis, septic, knee (HCC)   08/13/16 1255     Follow-up Information    Ollen Gross, MD. Schedule an appointment as soon as possible for a visit in 2 week(s).   Specialty:  Orthopedic Surgery Why:  For suture removal Contact information: [address] [place] [address] [phone]        [facility] Follow up.   Why:  A representative from [facility] will contact you to arrange delivery of IV antibiotics. Contact information: [address] [place] [address] [phone]        [facility] Follow up.   Specialty:  Home Health Services Why:  A representative from [facility] will contact you to arrange start date and time for your therapy. Contact information: [address] [place] [address] [phone]          Allergies  Allergen Reactions  . Sulfa Antibiotics Hives     Procedures/Studies: Dg Knee 1-2 Views Right  Result Date: 08/11/2016 CLINICAL DATA:  81 year old female with painful right total knee replacement. Swelling and pain for 2 days. EXAM: RIGHT KNEE - 1-2 VIEW COMPARISON:  None. FINDINGS: Large joint effusion evident on the cross-table lateral view. Total knee arthroplasty hardware appears intact and normally aligned. Patella appears intact. Dystrophic calcifications at the quadriceps tendon. No acute osseous abnormality identified. IMPRESSION: Large joint effusion. No acute osseous abnormality or adverse hardware features identified.  Electronically Signed   By: Odessa Fleming M.D.   On: 08/11/2016 13:34   Dg Knee Right Port  Result Date: 08/11/2016 CLINICAL DATA:  S/P IRRIGATION AND DEBRIDEMENT KNEE WITH POLY EXCHANGE (Right Leg Upper) EXAM: PORTABLE RIGHT KNEE - 1-2 VIEW COMPARISON:  08/11/2016 FINDINGS: Status post right knee arthroplasty. Locules of gas are identified in the joint space and soft tissues. A surgical drain is identified in the joint space. There is no acute fracture. IMPRESSION: Soft tissue gas and surgical drain following urination. Electronically Signed   By: Norva Pavlov M.D.   On: 08/11/2016 19:07    TEE on 08/14/2016  Study Conclusions  - Left ventricle: Systolic function was normal. The estimated   ejection fraction was in the range of 55% to 60%. Wall motion was   normal; there were no regional wall motion abnormalities. - Aortic valve: No evidence of vegetation. There was mild   regurgitation. - Aorta: There was mild non-mobile atheroma. - Mitral valve: There was mild regurgitation. - Left atrium: No evidence of thrombus in the atrial cavity or   appendage. No evidence of thrombus in the atrial cavity or   appendage. - Right atrium: No evidence of thrombus in the atrial cavity or   appendage. No evidence of thrombus in the atrial cavity or   appendage. - Tricuspid valve: No evidence of vegetation. There was mild   regurgitation. - Pulmonic valve: No evidence of vegetation.  Impressions:  - There is a highly mobile echodensity measuring 13 x 3 mm attached   to the right atrial lead in the right atrium at the tricuspid   valve level. This is consistent with pacemaker endocarditis.  Subjective: C/o pain in her rt groin/ hip. Afebrile. Also c/o nausea  Discharge Exam: Vitals:   08/15/16 0500 08/15/16 0926  BP: (!) 129/58 (!) 155/65  Pulse: 75 75  Resp: 20 18  Temp: 98 F (36.7 C) 98.1 F (36.7 C)  SpO2: 97%    Vitals:   08/14/16 1652 08/14/16 2034 08/15/16 0500 08/15/16 0926   BP: (!) 147/52 (!) 111/53 (!) 129/58 (!) 155/65  Pulse: 77 77 75 75  Resp: 13 18 20 18   Temp:  98.4 F (36.9 C) 98 F (36.7 C) 98.1 F (36.7 C)  TempSrc:  Oral Oral Oral  SpO2: 98% 95% 97%   Weight:      Height:        Gen: not in distress, fatigued HEENT: Pallor present, moist mucosa, supple neck Chest: clear b/l, no added sounds CVS: N S1&S2, systolic murmur 3/6, no rubs or gallop, pacemaker in place GI: soft, nondistended , rt groin/ hip tenderness,  bowel sounds present,  Musculoskeletal: warm, clean dressing over rt knee. CNS: alert and oriented.     The results of significant diagnostics from this hospitalization (including imaging, microbiology, ancillary and laboratory) are listed below for reference.     Microbiology: Recent Results (from the past 240 hour(s))  Culture, body fluid-bottle     Status: Abnormal   Collection Time: 08/11/16  1:46 PM  Result Value Ref Range Status   Specimen Description FLUID SYNOVIAL RIGHT KNEE  Final   Special Requests NONE  Final   Gram Stain   Final    GRAM POSITIVE COCCI IN CLUSTERS IN BOTH AEROBIC AND ANAEROBIC BOTTLES    Culture STAPHYLOCOCCUS AUREUS (A)  Final   Report Status 08/14/2016 FINAL  Final   Organism ID, Bacteria STAPHYLOCOCCUS AUREUS  Final      Susceptibility   Staphylococcus aureus - MIC*    CIPROFLOXACIN <=0.5 SENSITIVE Sensitive     ERYTHROMYCIN >=8 RESISTANT Resistant     GENTAMICIN <=0.5 SENSITIVE Sensitive     OXACILLIN 0.5 SENSITIVE Sensitive     TETRACYCLINE <=1 SENSITIVE Sensitive     VANCOMYCIN <=0.5 SENSITIVE Sensitive     TRIMETH/SULFA <=10 SENSITIVE Sensitive     CLINDAMYCIN RESISTANT Resistant     RIFAMPIN <=0.5 SENSITIVE Sensitive     Inducible Clindamycin POSITIVE Resistant     * STAPHYLOCOCCUS AUREUS  Gram stain     Status: None   Collection Time: 08/11/16  1:46 PM  Result Value Ref Range Status   Specimen Description FLUID SYNOVIAL RIGHT KNEE  Final   Special Requests NONE  Final    Gram Stain   Final    ABUNDANT WBC PRESENT, PREDOMINANTLY PMN RARE INTRACELLULAR GRAM POSITIVE COCCI IN PAIRS    Report Status 08/11/2016 FINAL  Final  Blood Culture (routine x 2)     Status: None (Preliminary result)   Collection Time: 08/11/16  1:57 PM  Result Value Ref Range Status   Specimen Description BLOOD RIGHT ANTECUBITAL  Final   Special Requests IN PEDIATRIC BOTTLE Blood Culture adequate volume  Final   Culture NO GROWTH 3 DAYS  Final   Report Status PENDING  Incomplete  Blood Culture (routine x 2)     Status: None (Preliminary result)   Collection Time: 08/11/16  2:00 PM  Result Value Ref Range Status   Specimen Description BLOOD LEFT ANTECUBITAL  Final   Special Requests   Final    BOTTLES DRAWN AEROBIC AND ANAEROBIC Blood Culture adequate volume   Culture NO GROWTH 3 DAYS  Final   Report Status PENDING  Incomplete  Aerobic/Anaerobic Culture (surgical/deep wound)     Status: None (Preliminary result)   Collection Time: 08/11/16  4:55 PM  Result Value Ref Range Status   Specimen Description KNEE RIGHT TISSUE  Final   Special Requests SPEC A  Final   Gram Stain   Final    FEW WBC PRESENT, PREDOMINANTLY PMN RARE GRAM POSITIVE COCCI IN PAIRS Gram Stain Report Called to,Read Back By and Verified With: A COLLINS,RN AT 1802 08/11/16 BY L BENFIELD    Culture   Final    FEW STAPHYLOCOCCUS AUREUS NO ANAEROBES ISOLATED; CULTURE IN PROGRESS FOR 5 DAYS    Report Status PENDING  Incomplete   Organism ID, Bacteria STAPHYLOCOCCUS AUREUS  Final      Susceptibility   Staphylococcus aureus - MIC*    CIPROFLOXACIN <=0.5 SENSITIVE Sensitive     ERYTHROMYCIN >=8 RESISTANT Resistant     GENTAMICIN <=0.5 SENSITIVE Sensitive     OXACILLIN 0.5 SENSITIVE Sensitive     TETRACYCLINE <=1 SENSITIVE Sensitive     VANCOMYCIN <=0.5 SENSITIVE Sensitive     TRIMETH/SULFA <=10 SENSITIVE Sensitive     CLINDAMYCIN RESISTANT Resistant     RIFAMPIN <=0.5 SENSITIVE Sensitive     Inducible  Clindamycin POSITIVE Resistant     * FEW STAPHYLOCOCCUS AUREUS  Surgical pcr screen     Status: None   Collection Time: 08/14/16  2:46 AM  Result Value Ref Range Status   MRSA, PCR NEGATIVE NEGATIVE Final   Staphylococcus aureus NEGATIVE NEGATIVE Final    Comment:        The Xpert SA Assay (FDA approved for NASAL specimens in patients over 8 years of age), is one component of a comprehensive surveillance program.  Test performance has been validated by [facility] for patients greater than or equal to 88 year old. It is not intended to diagnose infection nor to guide or monitor treatment.      Labs: BNP (last 3 results)  Recent Labs  12/07/15 0934  BNP 33.0   Basic Metabolic Panel:  Recent Labs Lab 08/11/16 1100 08/12/16 0343 08/14/16 0354 08/15/16 0458  NA 137 134* 140 139  K 4.2 3.8 4.1 4.9  CL 103 104 108 104  CO2 24 22 25 27   GLUCOSE 121* 186* 94 97  BUN 17 23* 22* 12  CREATININE 1.01* 1.42* 0.99 0.78  CALCIUM 9.3 8.5*  8.6* 8.9   Liver Function Tests:  Recent Labs Lab 08/11/16 1100 08/14/16 0354  AST 16 13*  ALT 8* <5*  ALKPHOS 73 51  BILITOT 0.7 0.9  PROT 7.7 5.6*  ALBUMIN 3.3* 2.5*   No results for input(s): LIPASE, AMYLASE in the last 168 hours. No results for input(s): AMMONIA in the last 168 hours. CBC:  Recent Labs Lab 08/11/16 1100 08/12/16 0343 08/13/16 0559 08/14/16 0354  WBC 8.0 7.9 7.2 4.5  NEUTROABS 6.5  --   --   --   HGB 11.3* 8.8* 7.3* 8.8*  HCT 34.7* 27.5* 22.6* 27.5*  MCV 93.0 92.3 91.5 90.8  PLT 165 157 160 189   Cardiac Enzymes: No results for input(s): CKTOTAL, CKMB, CKMBINDEX, TROPONINI in the last 168 hours. BNP: Invalid input(s): POCBNP CBG: No results for input(s): GLUCAP in the last 168 hours. D-Dimer No results for input(s): DDIMER in the last 72 hours. Hgb A1c No results for input(s): HGBA1C in the last 72 hours. Lipid Profile No results for input(s): CHOL, HDL, LDLCALC, TRIG, CHOLHDL, LDLDIRECT in  the last 72 hours. Thyroid function studies No results for input(s): TSH, T4TOTAL, T3FREE, THYROIDAB in the last 72 hours.  Invalid input(s): FREET3 Anemia work up  Recent Labs  08/13/16 1120  VITAMINB12 311  FOLATE 6.1  FERRITIN 64  TIBC 274  IRON 18*  RETICCTPCT 2.7   Urinalysis    Component Value Date/Time   COLORURINE YELLOW 08/11/2016 1429   APPEARANCEUR CLEAR 08/11/2016 1429   LABSPEC 1.025 08/11/2016 1429   PHURINE 5.5 08/11/2016 1429   GLUCOSEU NEGATIVE 08/11/2016 1429   HGBUR TRACE (A) 08/11/2016 1429   BILIRUBINUR NEGATIVE 08/11/2016 1429   KETONESUR NEGATIVE 08/11/2016 1429   PROTEINUR 100 (A) 08/11/2016 1429   NITRITE NEGATIVE 08/11/2016 1429   LEUKOCYTESUR NEGATIVE 08/11/2016 1429   Sepsis Labs Invalid input(s): PROCALCITONIN,  WBC,  LACTICIDVEN Microbiology Recent Results (from the past 240 hour(s))  Culture, body fluid-bottle     Status: Abnormal   Collection Time: 08/11/16  1:46 PM  Result Value Ref Range Status   Specimen Description FLUID SYNOVIAL RIGHT KNEE  Final   Special Requests NONE  Final   Gram Stain   Final    GRAM POSITIVE COCCI IN CLUSTERS IN BOTH AEROBIC AND ANAEROBIC BOTTLES    Culture STAPHYLOCOCCUS AUREUS (A)  Final   Report Status 08/14/2016 FINAL  Final   Organism ID, Bacteria STAPHYLOCOCCUS AUREUS  Final      Susceptibility   Staphylococcus aureus - MIC*    CIPROFLOXACIN <=0.5 SENSITIVE Sensitive     ERYTHROMYCIN >=8 RESISTANT Resistant     GENTAMICIN <=0.5 SENSITIVE Sensitive     OXACILLIN 0.5 SENSITIVE Sensitive     TETRACYCLINE <=1 SENSITIVE Sensitive     VANCOMYCIN <=0.5 SENSITIVE Sensitive     TRIMETH/SULFA <=10 SENSITIVE Sensitive     CLINDAMYCIN RESISTANT Resistant     RIFAMPIN <=0.5 SENSITIVE Sensitive     Inducible Clindamycin POSITIVE Resistant     * STAPHYLOCOCCUS AUREUS  Gram stain     Status: None   Collection Time: 08/11/16  1:46 PM  Result Value Ref Range Status   Specimen Description FLUID SYNOVIAL  RIGHT KNEE  Final   Special Requests NONE  Final   Gram Stain   Final    ABUNDANT WBC PRESENT, PREDOMINANTLY PMN RARE INTRACELLULAR GRAM POSITIVE COCCI IN PAIRS    Report Status 08/11/2016 FINAL  Final  Blood Culture (routine x 2)  Status: None (Preliminary result)   Collection Time: 08/11/16  1:57 PM  Result Value Ref Range Status   Specimen Description BLOOD RIGHT ANTECUBITAL  Final   Special Requests IN PEDIATRIC BOTTLE Blood Culture adequate volume  Final   Culture NO GROWTH 3 DAYS  Final   Report Status PENDING  Incomplete  Blood Culture (routine x 2)     Status: None (Preliminary result)   Collection Time: 08/11/16  2:00 PM  Result Value Ref Range Status   Specimen Description BLOOD LEFT ANTECUBITAL  Final   Special Requests   Final    BOTTLES DRAWN AEROBIC AND ANAEROBIC Blood Culture adequate volume   Culture NO GROWTH 3 DAYS  Final   Report Status PENDING  Incomplete  Aerobic/Anaerobic Culture (surgical/deep wound)     Status: None (Preliminary result)   Collection Time: 08/11/16  4:55 PM  Result Value Ref Range Status   Specimen Description KNEE RIGHT TISSUE  Final   Special Requests SPEC A  Final   Gram Stain   Final    FEW WBC PRESENT, PREDOMINANTLY PMN RARE GRAM POSITIVE COCCI IN PAIRS Gram Stain Report Called to,Read Back By and Verified With: A COLLINS,RN AT 1802 08/11/16 BY L BENFIELD    Culture   Final    FEW STAPHYLOCOCCUS AUREUS NO ANAEROBES ISOLATED; CULTURE IN PROGRESS FOR 5 DAYS    Report Status PENDING  Incomplete   Organism ID, Bacteria STAPHYLOCOCCUS AUREUS  Final      Susceptibility   Staphylococcus aureus - MIC*    CIPROFLOXACIN <=0.5 SENSITIVE Sensitive     ERYTHROMYCIN >=8 RESISTANT Resistant     GENTAMICIN <=0.5 SENSITIVE Sensitive     OXACILLIN 0.5 SENSITIVE Sensitive     TETRACYCLINE <=1 SENSITIVE Sensitive     VANCOMYCIN <=0.5 SENSITIVE Sensitive     TRIMETH/SULFA <=10 SENSITIVE Sensitive     CLINDAMYCIN RESISTANT Resistant     RIFAMPIN  <=0.5 SENSITIVE Sensitive     Inducible Clindamycin POSITIVE Resistant     * FEW STAPHYLOCOCCUS AUREUS  Surgical pcr screen     Status: None   Collection Time: 08/14/16  2:46 AM  Result Value Ref Range Status   MRSA, PCR NEGATIVE NEGATIVE Final   Staphylococcus aureus NEGATIVE NEGATIVE Final    Comment:        The Xpert SA Assay (FDA approved for NASAL specimens in patients over 34 years of age), is one component of a comprehensive surveillance program.  Test performance has been validated by [facility] for patients greater than or equal to 45 year old. It is not intended to diagnose infection nor to guide or monitor treatment.      Time coordinating discharge: Over 30 minutes  SIGNED:   Eddie North, MD  [facility] 08/15/2016, 9:40 AM Pager   If 7PM-7AM, please contact night-coverage [email or web address] Password [email or web address]

## 2016-08-15 NOTE — Care Management Important Message (Signed)
Important Message  Patient Details  Name: [name] [name] MRN: [id number] Date of Birth: August 01, 1935   Medicare Important Message Given:  Yes    Ariana Cavenaugh 08/15/2016, 12:06 PM

## 2016-08-15 NOTE — Progress Notes (Signed)
Physical Therapy Treatment Patient Details Name: [name] MRN: [id number] DOB: [birth year] Today's Date: 08/15/2016    History of Present Illness Pt is a 81 yo female [name]21[facility]. Pt was [facility] to [name] on 06/13/16 with a fever, nausea and vomiting and generalized weakness. Pt developed sepsis, hypotension and mild AKI and was diagnosed with MSSA bacteremia. Source is still in question, now considering leads from implaned defibrillator. PMH significant for lumbar surgery april, CHF, cardiomyopathy, hypothyroidism, biventricular ICD.  [name] Pt s/p I and D of R knee and poly exchange.    PT Comments    Pt tolerated Tx well, pt C/O pain initially but decreased with mobility. Pt is min guard with bed mobility, transfers, and gait. Pt ambulated 75 Feet and repositioned in reclining chair. Next Tx to Increase gait distance and continue with exercise, while still in acute care setting.    Follow Up Recommendations  Outpatient PT     Equipment Recommendations  None recommended by PT    Recommendations for Other Services       Precautions / Restrictions Precautions Precautions: Fall Restrictions Weight Bearing Restrictions: Yes RLE Weight Bearing: Weight bearing as tolerated    Mobility  Bed Mobility Overal bed mobility: Needs Assistance Bed Mobility: Supine to Sit     Supine to sit: Min guard Sit to supine: Min guard   General bed mobility comments: Pt able to get to EOB without assistance, VC's for hand placement  Transfers Overall transfer level: Needs assistance Equipment used: Rolling walker (2 wheeled) Transfers: Sit to/from Stand Sit to Stand: Min guard         General transfer comment: Pt required pretransfer cues to scoot to EOB to bring feet to floor. Increased time with no physical assistance   Ambulation/Gait Ambulation/Gait assistance: Min guard Ambulation Distance (Feet): 75 Feet Assistive device: Rolling walker  (2 wheeled) Gait Pattern/deviations: Trunk flexed;Decreased dorsiflexion - left;Decreased dorsiflexion - right;Step-to pattern;Step-through pattern Gait velocity: decreased   General Gait Details: Min guard for safety, VC's for posture    Stairs            Wheelchair Mobility    Modified Rankin (Stroke Patients Only)       Balance Overall balance assessment: Needs assistance Sitting-balance support: No upper extremity supported;Feet unsupported Sitting balance-Leahy Scale: Good     Standing balance support: During functional activity;Bilateral upper extremity supported Standing balance-Leahy Scale: Fair Standing balance comment: Reliant on RW for UE support during functional activity, No UE support required in static standing                             Cognition Arousal/Alertness: Awake/alert Behavior During Therapy: WFL for tasks assessed/performed Overall Cognitive Status: Within Functional Limits for tasks assessed                                        Exercises Other Exercises Other Exercises: R Hip flexor stretch x3 30 second hold Other Exercises: R Hip abductor stretch x1 15 second hold    General Comments        Pertinent Vitals/Pain Pain Assessment: 0-10 Pain Score: 6  Pain Location: R groin Pain Descriptors / Indicators: Aching;Sore;Cramping Pain Intervention(s): Limited activity within patient's tolerance;Monitored during session;Repositioned;Heat applied    Home Living  Prior Function            PT Goals (current goals can now be found in the care plan section) Acute Rehab PT Goals Patient Stated Goal: go to [name] today PT Goal Formulation: With patient Potential to Achieve Goals: Good Progress towards PT goals: Progressing toward goals    Frequency    7X/week      PT Plan Current plan remains appropriate    Co-evaluation              AM-PAC PT "6 Clicks" Daily  Activity  Outcome Measure  Difficulty turning over in bed (including adjusting bedclothes, sheets and blankets)?: A Little Difficulty moving from lying on back to sitting on the side of the bed? : A Lot Difficulty sitting down on and standing up from a chair with arms (e.g., wheelchair, bedside commode, etc,.)?: A Lot Help needed moving to and from a bed to chair (including a wheelchair)?: A Little Help needed walking in [name] room?: A Little Help needed climbing 3-5 steps with a railing? : A Lot 6 Click Score: 15    End of Session Equipment Utilized During Treatment: Gait belt Activity Tolerance: Patient tolerated treatment well Patient left: in chair;with call bell/phone within reach;with family/visitor present Nurse Communication: Mobility status;Other (comment) (Notified nurse of IV beeping ) PT Visit Diagnosis: Unsteadiness on feet (R26.81);Difficulty in walking, not elsewhere classified (R26.2)     Time: 4917-9150 PT Time Calculation (min) (ACUTE ONLY): 27 min  Charges:  $Gait Training: 8-22 mins $Therapeutic Activity: 8-22 mins                    G Codes:       Joya Willmott, SPTA [id number]    Saad Buhl 08/15/2016, 12:32 PM

## 2016-08-15 NOTE — Progress Notes (Signed)
Occupational Therapy Treatment Patient Details Name: KRISSI WILLAIMS MRN: [id number] DOB: October 24, 1935 Today's Date: 08/15/2016    History of present illness Pt is a 81 yo female admitted from [facility] hospital on 06/21/16. Pt was admitted to [facility] on 06/13/16 with a fever, nausea and vomiting and generalized weakness. Pt developed sepsis, hypotension and mild AKI and was diagnosed with MSSA bacteremia. Source is still in question, now considering leads from implaned defibrillator. PMH significant for lumbar surgery april, CHF, cardiomyopathy, hypothyroidism, biventricular ICD.  [name] Pt s/p I and D of R knee and poly exchange.   OT comments  Pt able to perform toilet transfer and perform grooming task x1 standing at the sink with min guard assist. Continues to require min assist for LB dressing. D/c plan remains appropriate. Will continue to follow acutely.   Follow Up Recommendations  Supervision/Assistance - 24 hour;No OT follow up    Equipment Recommendations  None recommended by OT    Recommendations for Other Services      Precautions / Restrictions Precautions Precautions: Fall Restrictions Weight Bearing Restrictions: Yes RLE Weight Bearing: Weight bearing as tolerated       Mobility Bed Mobility Overal bed mobility: Needs Assistance Bed Mobility: Supine to Sit;Sit to Supine     Supine to sit: Min guard Sit to supine: Min guard   General bed mobility comments: Pt able to manage RLE to EOB without physical assist. Required increased time. HOB minimally elevated with use of bed rails.  Transfers Overall transfer level: Needs assistance Equipment used: Rolling walker (2 wheeled) Transfers: Sit to/from Stand Sit to Stand: Min guard         General transfer comment: Increased time, no physical assist. Good hand placement and technique    Balance Overall balance assessment: Needs assistance Sitting-balance support: No upper extremity supported;Feet  unsupported Sitting balance-Leahy Scale: Good     Standing balance support: No upper extremity supported;During functional activity Standing balance-Leahy Scale: Fair                             ADL either performed or assessed with clinical judgement   ADL Overall ADL's : Needs assistance/impaired     Grooming: Min guard;Wash/dry hands;Standing               Lower Body Dressing: Minimal assistance Lower Body Dressing Details (indicate cue type and reason): Pt able to don L sock with supervision, Min assist for R sock Toilet Transfer: Min guard;Ambulation;BSC;RW   Toileting- Architect and Hygiene: Min guard;Sit to/from stand       Functional mobility during ADLs: Health and safety inspector     Praxis      Cognition Arousal/Alertness: Awake/alert Behavior During Therapy: WFL for tasks assessed/performed Overall Cognitive Status: Within Functional Limits for tasks assessed                                          Exercises     Shoulder Instructions       General Comments      Pertinent Vitals/ Pain       Pain Assessment: 0-10 Pain Score: 10-Worst pain ever Pain Location: R groin Pain Descriptors / Indicators: Aching;Sore Pain Intervention(s): Monitored during session;RN gave pain meds during session  Home  Living                                          Prior Functioning/Environment              Frequency  Min 2X/week        Progress Toward Goals  OT Goals(current goals can now be found in the care plan section)  Progress towards OT goals: Progressing toward goals  Acute Rehab OT Goals Patient Stated Goal: go to [facility] today OT Goal Formulation: With patient/family  Plan Discharge plan remains appropriate    Co-evaluation                 AM-PAC PT "6 Clicks" Daily Activity     Outcome Measure   Help from another person eating  meals?: None Help from another person taking care of personal grooming?: A Little Help from another person toileting, which includes using toliet, bedpan, or urinal?: A Little Help from another person bathing (including washing, rinsing, drying)?: A Little Help from another person to put on and taking off regular upper body clothing?: None Help from another person to put on and taking off regular lower body clothing?: A Little 6 Click Score: 20    End of Session Equipment Utilized During Treatment: Rolling walker  OT Visit Diagnosis: Unsteadiness on feet (R26.81);Other abnormalities of gait and mobility (R26.89);Muscle weakness (generalized) (M62.81);Pain Pain - Right/Left: Right Pain - part of body:  (groin)   Activity Tolerance Patient tolerated treatment well   Patient Left in bed;with call bell/phone within reach;with family/visitor present   Nurse Communication          Time: 0211-1552 OT Time Calculation (min): 16 min  Charges: OT General Charges $OT Visit: 1 Procedure OT Treatments $Self Care/Home Management : 8-22 mins  Yitzchok Carriger A. Brett Albino, M.S., OTR/L Pager: [phone]   Gaye Alken 08/15/2016, 10:34 AM

## 2016-08-15 NOTE — Progress Notes (Signed)
Subjective:  Patient reports pain as mild to moderate.  C/o right hip and buttock pain similar to yesterday. Objective:   VITALS:   Vitals:   08/14/16 1640 08/14/16 1652 08/14/16 2034 08/15/16 0500  BP: (!) 142/51 (!) 147/52 (!) 111/53 (!) 129/58  Pulse: 70 77 77 75  Resp: 18 13 18 20   Temp: 97.6 F (36.4 C)  98.4 F (36.9 C) 98 F (36.7 C)  TempSrc: Oral  Oral Oral  SpO2: 95% 98% 95% 97%  Weight:      Height:        NAD ABD soft Sensation intact distally Intact pulses distally Dorsiflexion/Plantar flexion intact Incision: dressing C/D/I Compartment soft Wound VAC intact Painless logrolling of hip  L knee: no erythema, swelling, or effusion. NO evidence of infection.   Lab Results  Component Value Date   WBC 4.5 08/14/2016   HGB 8.8 (L) 08/14/2016   HCT 27.5 (L) 08/14/2016   MCV 90.8 08/14/2016   PLT 189 08/14/2016   BMET    Component Value Date/Time   NA 139 08/15/2016 0458   K 4.9 08/15/2016 0458   CL 104 08/15/2016 0458   CO2 27 08/15/2016 0458   GLUCOSE 97 08/15/2016 0458   BUN 12 08/15/2016 0458   CREATININE 0.78 08/15/2016 0458   CREATININE 1.11 (H) 12/07/2015 0934   CALCIUM 8.9 08/15/2016 0458   GFRNONAA >60 08/15/2016 0458   GFRAA >60 08/15/2016 0458     Recent Results (from the past 240 hour(s))  Culture, body fluid-bottle     Status: Abnormal   Collection Time: 08/11/16  1:46 PM  Result Value Ref Range Status   Specimen Description FLUID SYNOVIAL RIGHT KNEE  Final   Special Requests NONE  Final   Gram Stain   Final    GRAM POSITIVE COCCI IN CLUSTERS IN BOTH AEROBIC AND ANAEROBIC BOTTLES    Culture STAPHYLOCOCCUS AUREUS (A)  Final   Report Status 08/14/2016 FINAL  Final   Organism ID, Bacteria STAPHYLOCOCCUS AUREUS  Final      Susceptibility   Staphylococcus aureus - MIC*    CIPROFLOXACIN <=0.5 SENSITIVE Sensitive     ERYTHROMYCIN >=8 RESISTANT Resistant     GENTAMICIN <=0.5 SENSITIVE Sensitive     OXACILLIN 0.5 SENSITIVE  Sensitive     TETRACYCLINE <=1 SENSITIVE Sensitive     VANCOMYCIN <=0.5 SENSITIVE Sensitive     TRIMETH/SULFA <=10 SENSITIVE Sensitive     CLINDAMYCIN RESISTANT Resistant     RIFAMPIN <=0.5 SENSITIVE Sensitive     Inducible Clindamycin POSITIVE Resistant     * STAPHYLOCOCCUS AUREUS  Gram stain     Status: None   Collection Time: 08/11/16  1:46 PM  Result Value Ref Range Status   Specimen Description FLUID SYNOVIAL RIGHT KNEE  Final   Special Requests NONE  Final   Gram Stain   Final    ABUNDANT WBC PRESENT, PREDOMINANTLY PMN RARE INTRACELLULAR GRAM POSITIVE COCCI IN PAIRS    Report Status 08/11/2016 FINAL  Final  Blood Culture (routine x 2)     Status: None (Preliminary result)   Collection Time: 08/11/16  1:57 PM  Result Value Ref Range Status   Specimen Description BLOOD RIGHT ANTECUBITAL  Final   Special Requests IN PEDIATRIC BOTTLE Blood Culture adequate volume  Final   Culture NO GROWTH 3 DAYS  Final   Report Status PENDING  Incomplete  Blood Culture (routine x 2)     Status: None (Preliminary result)   Collection Time:  08/11/16  2:00 PM  Result Value Ref Range Status   Specimen Description BLOOD LEFT ANTECUBITAL  Final   Special Requests   Final    BOTTLES DRAWN AEROBIC AND ANAEROBIC Blood Culture adequate volume   Culture NO GROWTH 3 DAYS  Final   Report Status PENDING  Incomplete  Aerobic/Anaerobic Culture (surgical/deep wound)     Status: None (Preliminary result)   Collection Time: 08/11/16  4:55 PM  Result Value Ref Range Status   Specimen Description KNEE RIGHT TISSUE  Final   Special Requests SPEC A  Final   Gram Stain   Final    FEW WBC PRESENT, PREDOMINANTLY PMN RARE GRAM POSITIVE COCCI IN PAIRS Gram Stain Report Called to,Read Back By and Verified With: A COLLINS,RN AT 1802 08/11/16 BY L BENFIELD    Culture   Final    FEW STAPHYLOCOCCUS AUREUS NO ANAEROBES ISOLATED; CULTURE IN PROGRESS FOR 5 DAYS    Report Status PENDING  Incomplete   Organism ID,  Bacteria STAPHYLOCOCCUS AUREUS  Final      Susceptibility   Staphylococcus aureus - MIC*    CIPROFLOXACIN <=0.5 SENSITIVE Sensitive     ERYTHROMYCIN >=8 RESISTANT Resistant     GENTAMICIN <=0.5 SENSITIVE Sensitive     OXACILLIN 0.5 SENSITIVE Sensitive     TETRACYCLINE <=1 SENSITIVE Sensitive     VANCOMYCIN <=0.5 SENSITIVE Sensitive     TRIMETH/SULFA <=10 SENSITIVE Sensitive     CLINDAMYCIN RESISTANT Resistant     RIFAMPIN <=0.5 SENSITIVE Sensitive     Inducible Clindamycin POSITIVE Resistant     * FEW STAPHYLOCOCCUS AUREUS  Surgical pcr screen     Status: None   Collection Time: 08/14/16  2:46 AM  Result Value Ref Range Status   MRSA, PCR NEGATIVE NEGATIVE Final   Staphylococcus aureus NEGATIVE NEGATIVE Final    Comment:        The Xpert SA Assay (FDA approved for NASAL specimens in patients over 85 years of age), is one component of a comprehensive surveillance program.  Test performance has been validated by [facility] for patients greater than or equal to 68 year old. It is not intended to diagnose infection nor to guide or monitor treatment.      Assessment/Plan: 1 Day Post-Op   Principal Problem:   Septic arthritis (HCC) Active Problems:   Chronic systolic CHF (congestive heart failure), NYHA class 3 (HCC)   Hypothyroidism   Biventricular ICD (implantable cardioverter-defibrillator) in place   S/P lumbar fusion   MSSA bacteremia   Infection of total right knee replacement (HCC)   Infected defibrillator (HCC)   Painful total knee replacement, right (HCC)   WBAT with walker Abx per ID: ancef and rifampin DVT ppx: lovenox in house --> discharge on 30 days of apixaban 2.5 mg PO BID, SCDs, TEDs PO pain control Follow cultures: MSSA R hip pain: likely positional from lying supine in bed w/ h/o LBP, low suspicion for septic arthritis due to painless ROM of hip, if worsens can consider MRI R hip to r/o effusion once AICD removed Dispo: plan to transfer to [facility]  today for cardiac device removal, will need to f/u with Dr. Ollen Gross 2 weeks after discharge - please call [phone] to schedule. Daily WC to R knee with ABD pad - secure with thigh high TEDs   Hajime Asfaw, Cloyde Reams 08/15/2016, 7:59 AM   Samson Frederic, MD Cell [phone]

## 2016-08-15 NOTE — Progress Notes (Signed)
      INFECTIOUS DISEASE ATTENDING ADDENDUM:   Date: 08/15/2016  [name]  Medical record number: [id number]  Date of birth: October 15, 1935    This patient has been seen and discussed with the house staff. Please see the resident's note for complete details. I concur with their findings with the following additions/corrections:  Patient is still c/o groin pain on right side  She has a LARGE mobile vegetation on PM lead   #1 CARDIAC DEVICE INFECTION  To ensure maximum chance of CURE of endovascular infection and of preventing further seeing of other sites such as her opposite prosthetic knee I believe she SHOULD HAVE HER DEVICE EXPLANTED followed by 6 weeks of parenteral antibiotics prior to reimplantation  Dr. Ladona Ridgel is out of town for 2 weeks so this would need to happen via transfer to [facility] which Dr. Gonzella Lex has arranged  continue Ancef and rifampin  I DO NOT THINK THAT IV ABX followed by PO abx will be a successful strategy at suppressing her endovascular device infection ABSENT removal of the device  #2 Groin pain: her RIGHT HIP SHOULD BE IMAGED WITH MRI with gad. I believe her device may be MRI compatible. Alternatively could image after extraction. I am worried that she might have a SEPTIC HIP with MSSA   #3  PJI she would need 6 weeks of parenteral antibiotics followed by protracted oral abx (likely keflex and rifampin with MINIMUM of 6 months and potentially longer depending upon her response to treatment, knee pain and inflammatory markers.   #4 Nausea: likely due to opiates  I spent greater than 35  minutes with the patient including greater than 50% of time in face to face counsel of the patient and in coordination of their care with Cardiology and primary team.  [facility] Team please contact us at [facility] [phone] to ensure she is going to followup with [place] in [place]. I do not mind if your protocol for home abx requires she come to [facility] for  part of this but we wish to follow her long term.   [name] 08/15/2016, 10:13 AM

## 2016-08-15 NOTE — Consult Note (Signed)
ELECTROPHYSIOLOGY PROGRESS NOTE  Patient ID: [name] [name], MRN: [id number], DOB/AGE: [birth year] 81 y.o. Admit date: 08/11/2016 Date of Consult: 08/15/2016  Primary Physician: [name], MD Primary Cardiologist: [facility] [name] [name] is a 81 y.o. female who is being seen today for the evaluation of staph infection at the request of Daiva Eves.   Chief Complaint: staph infection w pacer in place    HPI [name] [name] is a 81 y.o. female  Seen in followup for Staph CIED Is staph aureus septic arthritis in the context of a knee arthroplasty.  She has a history of nonischemic cardiomyopathy left bundle branch block and nonsustained ventricular tachycardia. 6/16 she underwent CRT P-implantation with considerable functional improvement. 6/18 she developed  MSSA bacteremia in the context of orthopedic surgery i.e. a lumbar fusion 4/18.. Reportedly a TEE from [facility] was negative. She was treated with 6 weeks of antibiotics. Discussions at that time with ID suggested surveillance post antibiotic cultures   TEE yesterday demonstrated vegetation on the atrial lead.  She continues to complain of groin pain.  Orthopedics has been involved with the drainage from her right knee.  Past Medical History:  Diagnosis Date  . 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 Arthritis    "finger joints" (06/21/2016)  . Asthma   . CHF (congestive heart failure) (HCC)   . Congestive dilated cardiomyopathy (HCC) 03/31/2014   a. s/p Biotronik CRTD 06/2014  . GERD (gastroesophageal reflux disease)   . History of blood transfusion 1990s   "related to hysterectomy"  . Hypothyroidism   . Left bundle branch block   . MSSA bacteremia 06/21/2016  . Myocardial infarction (HCC)   . Pancreatitis, acute       Surgical History:  Past Surgical History:  Procedure Laterality Date  . ABDOMINAL HYSTERECTOMY    .  APPENDECTOMY    . CARDIAC CATHETERIZATION    . CARDIAC DEFIBRILLATOR PLACEMENT  ?2016  . CATARACT EXTRACTION W/ INTRAOCULAR LENS  IMPLANT, BILATERAL Bilateral   . CHOLECYSTECTOMY OPEN     "related to pancreatitis"  . DILATION AND CURETTAGE OF UTERUS    . EP IMPLANTABLE DEVICE N/A 07/04/2014   Procedure: BiV ICD Insertion CRT-D;  Surgeon: Duke Salvia, MD;  Location: [facility] INVASIVE CV LAB;  Service: Cardiovascular;  Laterality: N/A;  . HERNIA REPAIR    . I&D KNEE WITH POLY EXCHANGE Right 08/11/2016   Procedure: IRRIGATION AND DEBRIDEMENT KNEE WITH POLY EXCHANGE;  Surgeon: Samson Frederic, MD;  Location: [facility] OR;  Service: Orthopedics;  Laterality: Right;  . INSERT / REPLACE / REMOVE PACEMAKER  ?2016  . JOINT REPLACEMENT Bilateral    "thumb joints; used my own material"  . JOINT REPLACEMENT    . LEFT HEART CATHETERIZATION WITH CORONARY ANGIOGRAM N/A 11/02/2013   Procedure: LEFT HEART CATHETERIZATION WITH CORONARY ANGIOGRAM;  Surgeon: Corky Crafts, MD;  Location: [facility] CATH LAB;  Service: Cardiovascular;  Laterality: N/A;  . LOOP RECORDER IMPLANT N/A 04/13/2014   Procedure: LOOP RECORDER IMPLANT;  Surgeon: Duke Salvia, MD;  Location: [facility] CATH LAB;  Service: Cardiovascular;  Laterality: N/A;  . LOOP RECORDER REMOVAL  ?2016  . POSTERIOR LUMBAR FUSION  05/03/2016   Lumbar Two-Three, Lumbar Three-Four Posterior Lumbar Fusion withLaminectomy and Foraminotomy  . SPINE SURGERY  04/2016   lumbar   . TONSILLECTOMY    . TOTAL HIP ARTHROPLASTY Bilateral   .  UMBILICAL HERNIA REPAIR  1970s?      Inpatient Medications:  . acidophilus  1 capsule Oral QHS  . carvedilol  12.5 mg Oral BID WC  . docusate sodium  100 mg Oral BID  . enoxaparin (LOVENOX) injection  40 mg Subcutaneous Q24H  . feeding supplement (ENSURE ENLIVE)  237 mL Oral BID BM  . levothyroxine  112 mcg Oral QAC breakfast  . losartan  50 mg Oral Daily  . rifampin  300 mg Oral Q12H  . senna  2 tablet Oral QHS      Allergies:    Allergies  Allergen Reactions  . Sulfa Antibiotics Hives       ROS:  Please see the history of present illness.     All other systems reviewed and negative.    Physical Exam: Blood pressure (!) 155/65, pulse 75, temperature 98.1 F (36.7 C), resp. rate 18, height 4\' 10"  (1.473 m), weight 150 lb (68 kg), SpO2 97 %. Well developed and nourished in no acute distress But ill appearing HENT normal Neck supple with JVP-flat Carotids brisk and full without bruits Clear Regular rate and rhythm, no murmurs or gallops Abd-soft with active BS without hepatomegaly No Clubbing cyanosis tredema Drain  Skin-wa in her right kneerm and dry A & Oriented  Grossly normal sensory and motor function     Labs: Cardiac Enzymes No results for input(s): CKTOTAL, CKMB, TROPONINI in the last 72 hours. CBC Lab Results  Component Value Date   WBC 4.5 08/14/2016   HGB 8.8 (L) 08/14/2016   HCT 27.5 (L) 08/14/2016   MCV 90.8 08/14/2016   PLT 189 08/14/2016   PROTIME: No results for input(s): LABPROT, INR in the last 72 hours. Chemistry   Recent Labs Lab 08/14/16 0354 08/15/16 0458  NA 140 139  K 4.1 4.9  CL 108 104  CO2 25 27  BUN 22* 12  CREATININE 0.99 0.78  CALCIUM 8.6* 8.9  PROT 5.6*  --   BILITOT 0.9  --   ALKPHOS 51  --   ALT <5*  --   AST 13*  --   GLUCOSE 94 97   Lipids Lab Results  Component Value Date   CHOL 231 (H) 12/07/2015   HDL 61 12/07/2015   LDLCALC 144 (H) 12/07/2015   TRIG 131 12/07/2015   BNP No results found for: PROBNP Thyroid Function Tests: No results for input(s): TSH, T4TOTAL, T3FREE, THYROIDAB in the last 72 hours.  Invalid input(s): FREET3    Miscellaneous No results found for: DDIMER  Radiology/Studies:  Dg Knee 1-2 Views Right  Result Date: 08/11/2016 CLINICAL DATA:  81 year old female with painful right total knee replacement. Swelling and pain for 2 days. EXAM: RIGHT KNEE - 1-2 VIEW COMPARISON:  None. FINDINGS: Large joint effusion  evident on the cross-table lateral view. Total knee arthroplasty hardware appears intact and normally aligned. Patella appears intact. Dystrophic calcifications at the quadriceps tendon. No acute osseous abnormality identified. IMPRESSION: Large joint effusion. No acute osseous abnormality or adverse hardware features identified. Electronically Signed   By: Odessa Fleming M.D.   On: 08/11/2016 13:34   Dg Knee Right Port  Result Date: 08/11/2016 CLINICAL DATA:  S/P IRRIGATION AND DEBRIDEMENT KNEE WITH POLY EXCHANGE (Right Leg Upper) EXAM: PORTABLE RIGHT KNEE - 1-2 VIEW COMPARISON:  08/11/2016 FINDINGS: Status post right knee arthroplasty. Locules of gas are identified in the joint space and soft tissues. A surgical drain is identified in the joint space. There is no acute  fracture. IMPRESSION: Soft tissue gas and surgical drain following urination. Electronically Signed   By: Norva Pavlov M.D.   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 I have spoken with Dr. Gary Fleet who has agreed to receive her transfer and would anticipate extraction tomorrow or Monday.   There has been interval near normalization of LV function.  The device was implanted for primary prevention so extraction with outpatient support or Vest support would be most reasonable I think  Issues of reimplantation to be addressed as an outpatient  Sherryl Manges

## 2016-08-16 LAB — CULTURE, BLOOD (ROUTINE X 2)
Culture: NO GROWTH
Culture: NO GROWTH
SPECIAL REQUESTS: ADEQUATE
Special Requests: ADEQUATE

## 2016-08-16 LAB — AEROBIC/ANAEROBIC CULTURE (SURGICAL/DEEP WOUND)

## 2016-08-16 LAB — AEROBIC/ANAEROBIC CULTURE W GRAM STAIN (SURGICAL/DEEP WOUND)

## 2016-08-18 ENCOUNTER — Encounter (HOSPITAL_COMMUNITY): Payer: Self-pay | Admitting: Cardiology

## 2016-08-19 DIAGNOSIS — R768 Other specified abnormal immunological findings in serum: Secondary | ICD-10-CM | POA: Insufficient documentation

## 2016-08-20 ENCOUNTER — Telehealth: Payer: Self-pay | Admitting: Cardiology

## 2016-08-20 ENCOUNTER — Telehealth: Payer: Self-pay | Admitting: *Deleted

## 2016-08-20 NOTE — Telephone Encounter (Signed)
Spoke with the pt and wife and informed them both of Dr Lindaann Slough recommendations.  Pts Husband states that she will be discharged tomorrow to a rehab facility for the next 6 weeks, so her PICC line can be safely accessed and cared for.  Advised the pts Husband that at discharge, he should ask about follow-up with the pts surgeon, and have the facility the pt will rehab in, provide her transportation to this appt.  Advised the pts Husband to call our office for follow-up with Dr Graciela Husbands and Dr Delton See, once her Surgeon releases care.  Both verbalized understanding and agrees with this plan.  Both more than gracious for all the assistance provided.

## 2016-08-20 NOTE — Telephone Encounter (Signed)
Patient's daughter called stating that patient was at [facility] and they want to send her to a skilled nursing facility for six months. She wants her mother to get IV antibiotics as an out patient. She thought that our doctors go to [facility]. Explained they do not and she is going to talk to her MDs at [facility] and see if her Mom can be discharged home. Wendall Mola

## 2016-08-20 NOTE — Telephone Encounter (Signed)
No need to see me all the abx are done

## 2016-08-20 NOTE — Telephone Encounter (Signed)
They should follow once with the surgeon as that's standart practice, but then come to see [name] [name] and Dr Graciela Husbands within the next 2 weeks. Please say hi to them.

## 2016-08-20 NOTE — Telephone Encounter (Signed)
New message       Patient is in [facility].   Husband want to talk to [facility] or Dr Delton See.  He would not tell me what exactly he wanted

## 2016-08-20 NOTE — Telephone Encounter (Signed)
Pts Husband is calling to inform both Dr. Delton See and Dr. Graciela Husbands that the pt will soon be discharged from [facility], to her private residence with home health care.  Husband wanted to keep both Dr. Delton See and Dr. Graciela Husbands in the loop with pts future discharge, for both the pt and Husband want the pts care to be transferred back to Dr Delton See and Dr Graciela Husbands for post hospital follow-up.  Husband is upset because the discharging MD's are discussing releasing the pts care into their Cardiologist with [facility].  Husband states that he will follow-up with the pts Surgeon post discharge, but wants them to make arrangements with the pts established Cardiologist ( Dr. Delton See and Dr. Graciela Husbands), for any other follow-up needed for the pts cardiac issues.  Informed the Husband that the pt should go for follow-up with the Surgeon who cared for her in the hospital, when discharge planning occurs. Informed the pts Husband that I do agree that any other cardiac related issues should be arranged and followed in our office, with her established Cardiologist.  Advised the pts Husband that when her discharge planning is being discussed with the pt, he should make the discharging RN/Case Manager aware of this request.  Informed the Husband that I will route this information to both Dr Delton See and Dr Graciela Husbands, for their review.  Spouse verbalized understanding and agrees with this plan.  Husband gracious for all the assistance provided.

## 2016-08-21 DIAGNOSIS — K219 Gastro-esophageal reflux disease without esophagitis: Secondary | ICD-10-CM | POA: Insufficient documentation

## 2016-08-23 ENCOUNTER — Telehealth: Payer: Self-pay | Admitting: Internal Medicine

## 2016-08-23 NOTE — Telephone Encounter (Signed)
Will need to review with Dr. Graciela Husbands- he spoke with the patient's husband earlier this week and told him the patient did not need to follow up with him at this point.

## 2016-08-23 NOTE — Telephone Encounter (Signed)
New message   Pt husband is calling and is very upset. States he needs advice and cannot get through to anyone. His wife has been to [facility] as well as the nursing home and they had to remove her pace maker because of an infection. He states she is having neck and back pain in addition to pain where the pacemaker was. He is unsure what to do or if she really needs the pacemaker. He requests a call back as soon as someone is able.

## 2016-08-23 NOTE — Telephone Encounter (Signed)
Called and spoke to pt's husband  Pt has appt w ortho 8 am tues And we will see her at 9 Tues   She may need some of her sutures removed following her extraction

## 2016-08-23 NOTE — Progress Notes (Deleted)
Cardiology Office Note Date:  08/23/2016  Patient ID:  [name], [name] 1935/02/21, MRN [id number] PCP:  [name], MD  Cardiologist:  Dr. Delton See Electrophysiologist: Dr. Graciela Husbands   Chief Complaint: SOB  History of Present Illness: [name] [name] is a 81 y.o. female with history of NICM w/hx of CRT-D though extracted 2/2 recurrent MSSA bacteremia,  asthma, LBBB, chronic CHF (systolic), hypothyroidism.  She was admitted to [facility] initially 6/7-18 with MSSA bacteremia of uncertain etiology, during this time severe sepsis and had 2/2 blood cultures positive on 6/7.  She had an LP and a CT chest that suggested RML and lingular infiltrates.  She was treated at various times with Vanc, Rocephin, Levaquin, and Doxy and she was eventually changed to Ancef per ID recommendations and completed 4 weeks of therapy.  TTE and TEE were negative.  She "felt great" upon completing her antibiotics on 7/5.    She had a bilateral TKR 14 years ago and on Thursday (8/2) noticed R knee stiffness/pain.  On Friday (8/3) it was swollen and hot, presented to [facility] 08/11/16 with acutely increased swelling and pain, in the ER Grossly purulent fluid on right knee arthrocentesis, admitted with septic arthritis and underwent I&D of R knee with ply exchange.  TEE this admission was + for vegetation on RA lead, and transferred to [facility] for device extraction (given Dr. Ladona Ridgel was away).  She underwent extraction of her device by Dr. Gary Fleet on 08/16/16.  *** DR. Gary Fleet? Wound f/u *** fluid status *** ID f/u, recommended 6 weeks IV/PO abx *** needs SK f/u once abx completed, needs post abx cultures *** rehab *** ortho f/u ? Hip??   DEVICE information: Biotronik CRT-D, implanted 07/04/14, Dr. Graciela Husbands EXTRACTED 2/2 MSSA bacteremia 08/16/16, Dr. Gary Fleet at [facility]  Past Medical History:  Diagnosis Date  . 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 Arthritis    "finger joints" (06/21/2016)  . Asthma   . CHF (congestive heart failure) (HCC)   . Congestive dilated cardiomyopathy (HCC) 03/31/2014   a. s/p Biotronik CRTD 06/2014  . GERD (gastroesophageal reflux disease)   . History of blood transfusion 1990s   "related to hysterectomy"  . Hypothyroidism   . Left bundle branch block   . MSSA bacteremia 06/21/2016  . Myocardial infarction (HCC)   . Pancreatitis, acute     Past Surgical History:  Procedure Laterality Date  . ABDOMINAL HYSTERECTOMY    . APPENDECTOMY    . CARDIAC CATHETERIZATION    . CARDIAC DEFIBRILLATOR PLACEMENT  ?2016  . CATARACT EXTRACTION W/ INTRAOCULAR LENS  IMPLANT, BILATERAL Bilateral   . CHOLECYSTECTOMY OPEN     "related to pancreatitis"  . DILATION AND CURETTAGE OF UTERUS    . EP IMPLANTABLE DEVICE N/A 07/04/2014   Procedure: BiV ICD Insertion CRT-D;  Surgeon: Duke Salvia, MD;  Location: [facility] INVASIVE CV LAB;  Service: Cardiovascular;  Laterality: N/A;  . HERNIA REPAIR    . I&D KNEE WITH POLY EXCHANGE Right 08/11/2016   Procedure: IRRIGATION AND DEBRIDEMENT KNEE WITH POLY EXCHANGE;  Surgeon: Samson Frederic, MD;  Location: [facility] OR;  Service: Orthopedics;  Laterality: Right;  . INSERT / REPLACE / REMOVE PACEMAKER  ?2016  . JOINT REPLACEMENT Bilateral    "thumb joints; used my own material"  . JOINT REPLACEMENT    . LEFT HEART CATHETERIZATION WITH CORONARY ANGIOGRAM N/A 11/02/2013  Procedure: LEFT HEART CATHETERIZATION WITH CORONARY ANGIOGRAM;  Surgeon: Corky Crafts, MD;  Location: [facility] CATH LAB;  Service: Cardiovascular;  Laterality: N/A;  . LOOP RECORDER IMPLANT N/A 04/13/2014   Procedure: LOOP RECORDER IMPLANT;  Surgeon: Duke Salvia, MD;  Location: [facility] CATH LAB;  Service: Cardiovascular;  Laterality: N/A;  . LOOP RECORDER REMOVAL  ?2016  . POSTERIOR LUMBAR FUSION  05/03/2016   Lumbar Two-Three, Lumbar Three-Four Posterior Lumbar Fusion withLaminectomy and Foraminotomy  . SPINE  SURGERY  04/2016   lumbar   . TEE WITHOUT CARDIOVERSION N/A 08/14/2016   Procedure: TRANSESOPHAGEAL ECHOCARDIOGRAM (TEE);  Surgeon: Lars Masson, MD;  Location: [facility] ENDOSCOPY;  Service: Cardiovascular;  Laterality: N/A;  . TONSILLECTOMY    . TOTAL HIP ARTHROPLASTY Bilateral   . UMBILICAL HERNIA REPAIR  1970s?    Current Outpatient Prescriptions  Medication Sig Dispense Refill  . albuterol (PROAIR HFA) 108 (90 Base) MCG/ACT inhaler Inhale 2 puffs into the lungs every 6 (six) hours as needed for wheezing or shortness of breath.    [name] alum & mag hydroxide-simeth (MAALOX/MYLANTA) 200-200-20 MG/5ML suspension Take 30 mLs by mouth every 4 (four) hours as needed for indigestion. 355 mL 0  . aspirin EC 81 MG tablet Take 81 mg by mouth daily.    [name] aspirin-acetaminophen-caffeine (EXCEDRIN MIGRAINE) 250-250-65 MG per tablet Take 1-2 tablets by mouth every 6 (six) hours as needed for headache or migraine.     [name] augmented betamethasone dipropionate (DIPROLENE-AF) 0.05 % ointment Apply 1 application topically daily as needed (acrodermatitis [place] on fingers/toes).     . Calcium Carbonate Antacid (TUMS PO) Take 2 tablets by mouth daily as needed (indigestion).    . carvedilol (COREG) 12.5 MG tablet Take 1 tablet (12.5 mg total) by mouth 2 (two) times daily. 180 tablet 3  . ceFAZolin (ANCEF) 2-4 GM/100ML-% IVPB Inject 100 mLs (2 g total) into the vein every 8 (eight) hours. 1 each 0  . diphenhydrAMINE (BENADRYL) 12.5 MG/5ML elixir Take 5-10 mLs (12.5-25 mg total) by mouth every 4 (four) hours as needed for itching. 120 mL 0  . docusate sodium (COLACE) 100 MG capsule Take 1 capsule (100 mg total) by mouth 2 (two) times daily. 10 capsule 0  . feeding supplement, ENSURE ENLIVE, (ENSURE ENLIVE) LIQD Take 237 mLs by mouth 2 (two) times daily between meals. 237 mL 12  . furosemide (LASIX) 20 MG tablet Take 1 tablet (20 mg total) by mouth daily as needed for edema. (Patient taking differently: Take 20 mg by  mouth daily as needed for fluid or edema. ) 90 tablet 3  . HYDROcodone-acetaminophen (NORCO/VICODIN) 5-325 MG tablet Take 1-2 tablets by mouth every 4 (four) hours as needed for moderate pain or severe pain. 30 tablet 0  . HYDROmorphone (DILAUDID) 1 MG/ML injection Inject 0.5-2 mLs (0.5-2 mg total) into the vein every 2 (two) hours as needed for severe pain (unresolved breakthrough pain). 1 mL 0  . HYDROmorphone (DILAUDID) 1 MG/ML injection Inject 0.5-2 mLs (0.5-2 mg total) into the vein every 2 (two) hours as needed for severe pain (unresolved severe breakthrough pain after oral or oral not tolerated). 1 mL 0  . levothyroxine (SYNTHROID, LEVOTHROID) 112 MCG tablet Take 112 mcg by mouth daily before breakfast.    . losartan (COZAAR) 50 MG tablet Take 1 tablet (50 mg total) by mouth daily. (Patient taking differently: Take 25-50 mg by mouth See admin instructions. 25mg  daily Then take an additional 25mg  when BP elevated) 30 tablet 11  .  Melatonin 5 MG TABS Take 15 mg by mouth at bedtime as needed (for sleep.).     [name] menthol-cetylpyridinium (CEPACOL) 3 MG lozenge Take 1 lozenge (3 mg total) by mouth as needed for sore throat (sore throat). 100 tablet 12  . methocarbamol (ROBAXIN) 500 MG tablet Take 1 tablet (500 mg total) by mouth every 6 (six) hours as needed for muscle spasms. 10 tablet 0  . methylphenidate (RITALIN) 20 MG tablet Take 20 mg by mouth 2 (two) times daily as needed (narcolepsy).     [name] metoCLOPramide (REGLAN) 5 MG tablet Take 1-2 tablets (5-10 mg total) by mouth every 8 (eight) hours as needed for nausea (if ondansetron (ZOFRAN) ineffective.). 10 tablet 0  . Naphazoline-Pheniramine (ALLERGY EYE OP) Place 1-2 drops into both eyes daily as needed (for irritated eyes).    . ondansetron (ZOFRAN) 4 MG tablet Take 0.5 tablets (2 mg total) by mouth every 8 (eight) hours as needed for nausea or vomiting. 20 tablet 3  . ondansetron (ZOFRAN) 4 MG tablet Take 1 tablet (4 mg total) by mouth every 6  (six) hours as needed for nausea. 20 tablet 0  . phenol (CHLORASEPTIC) 1.4 % LIQD Use as directed 1 spray in the mouth or throat as needed for throat irritation / pain. 1 Bottle 0  . polyethylene glycol (MIRALAX / GLYCOLAX) packet Take 17 g by mouth daily as needed for mild constipation. 14 each 0  . Probiotic Product (PROBIOTIC PO) Take 1 capsule by mouth at bedtime.     . rifampin (RIFADIN) 300 MG capsule Take 1 capsule (300 mg total) by mouth every 12 (twelve) hours. 10 capsule 0  . senna (SENOKOT) 8.6 MG TABS tablet Take 2 tablets (17.2 mg total) by mouth at bedtime. 120 each 0   No current facility-administered medications for this visit.     Allergies:   Sulfa antibiotics   Social History:  The patient  reports that she has never smoked. She has never used smokeless tobacco. She reports that she drinks alcohol. She reports that she does not use drugs.   Family History:  The patient's family history includes Arrhythmia in her mother; Heart attack in her father, maternal grandfather, and paternal grandfather; Heart disease in her father; Hyperlipidemia in her mother.  ROS:  Please see the history of present illness.  All other systems are reviewed and otherwise negative.   PHYSICAL EXAM:  VS:  There were no vitals taken for this visit. BMI: There is no height or weight on file to calculate BMI. Well nourished, well developed, in no acute distress  HEENT: normocephalic, atraumatic  Neck: no JVD, carotid bruits or masses Cardiac: *** RRR ; no significant murmurs, no rubs, or gallops Lungs: *** CTA b/l, no wheezing, rhonchi or rales  Abd: soft, nontender MS: no deformity or atrophy Ext: *** no edema is appreciated Skin: warm and dry, no rash Neuro:  No gross deficits appreciated Psych: euthymic mood, full affect  ICD extraction site: ***   EKG:  Done 08/11/16 ***   10/17/15: limited echo for AV optimization Impressions: - The study is performed for AV optimization   There is  grade 1 diastolic dysfunction   The optimal AV delay seems to be around 90-100 ms but i would   defer to the doctors managing the pacer.  02/01/15: TTE Study Conclusions - Left ventricle: The cavity size was normal. There was moderate   concentric hypertrophy. Systolic function was normal. The   estimated ejection fraction  was in the range of 50% to 55%. Wall   motion was normal; there were no regional wall motion   abnormalities. Doppler parameters are consistent with abnormal   left ventricular relaxation (grade 1 diastolic dysfunction). The   E/e&' ratio is >15, suggesting elevated LV filling pressure. - Mitral valve: Calcified annulus. Mildly thickened leaflets .   There was trivial regurgitation. - Left atrium: The atrium was normal in size. - Right ventricle: The cavity size was normal. Wall thickness was   normal. Pacer wire or catheter noted in right ventricle. Systolic   function was normal. - Right atrium: The atrium was normal in size. Pacer wire or   catheter noted in right atrium. - Tricuspid valve: There was moderate regurgitation. - Pulmonary arteries: PA peak pressure: 30 mm Hg (S). - Inferior vena cava: The vessel was normal in size. The   respirophasic diameter changes were in the normal range (= 50%),   consistent with normal central venous pressure. Impressions: - Compared to a prior study in 09/2014, the EF has improved to   50-55%.  Recent Labs: 12/07/2015: Brain Natriuretic Peptide 33.0 06/22/2016: Magnesium 1.9 08/12/2016: TSH 0.995 08/14/2016: ALT <5; Hemoglobin 8.8; Platelets 189 08/15/2016: BUN 12; Creatinine, Ser 0.78; Potassium 4.9; Sodium 139  12/07/2015: Cholesterol 231; HDL 61; LDL Cholesterol 144; Total CHOL/HDL Ratio 3.8; Triglycerides 131; VLDL 26   Estimated Creatinine Clearance: 47.3 mL/min (by C-G formula based on SCr of 0.78 mg/dL).   Wt Readings from Last 3 Encounters:  08/11/16 150 lb (68 kg)  07/24/16 150 lb (68 kg)  07/11/16 156 lb (70.8  kg)     Other studies reviewed: Additional studies/records reviewed today include: summarized above  ASSESSMENT AND PLAN:  1. SOB 1. NICM with interval improvement of her EF     ***  2. CRT-D     ***normal device function  3. HTN     ***stable    Disposition: ***  Current medicines are reviewed at length with the patient today.  The patient did not have any concerns regarding medicines.  Judith Blonder, PA-C 08/23/2016 2:53 PM     [facility] [address] [place] [address] [phone] (office)  [phone] (fax)

## 2016-08-26 ENCOUNTER — Telehealth: Payer: Self-pay | Admitting: Cardiology

## 2016-08-26 ENCOUNTER — Ambulatory Visit: Payer: Medicare Other | Admitting: Infectious Diseases

## 2016-08-26 NOTE — Telephone Encounter (Signed)
Spoke with the pts Husband and he needs to reschedule the pts post hospital follow-up appt with [facility] tomorrow, to another day, ASAP, due to other conflicting MD appts.  Spoke with EP [employer], and she will call the pts Husband back now, and reschedule the pts follow-up appt with our office with either Dr Graciela Husbands or Francis Dowse PA-C, at their very next available appt.  Informed the pts Husband of this plan and he agreed to this.

## 2016-08-26 NOTE — Telephone Encounter (Signed)
New Message    Husband called and wants to change appt with Renee due to having 2 appt scheduled at the same time, pt husband states that his wife is to see Dr Delton See and Luster Landsberg at the same time due infection effecting her device?  I explained to Mr Jurewicz that we do not having any openings at the moment with Renee until 9/4 and Dr Delton See is not in the office 8/21, he said to have IVY call him she will fix it.

## 2016-08-26 NOTE — Telephone Encounter (Signed)
Pts post-hospital follow-up appt is rescheduled with Renee on 8/23 at 0930. Pts Husband made aware of appt date and time by our EP Scheduler.

## 2016-08-27 ENCOUNTER — Ambulatory Visit: Payer: Medicare Other | Admitting: Physician Assistant

## 2016-08-27 ENCOUNTER — Telehealth: Payer: Self-pay | Admitting: Cardiology

## 2016-08-27 ENCOUNTER — Encounter: Payer: Medicare Other | Admitting: *Deleted

## 2016-08-27 NOTE — Telephone Encounter (Signed)
LMOVM reminding pt to send remote transmission.   

## 2016-08-28 ENCOUNTER — Encounter: Payer: Self-pay | Admitting: Infectious Diseases

## 2016-08-28 ENCOUNTER — Ambulatory Visit (INDEPENDENT_AMBULATORY_CARE_PROVIDER_SITE_OTHER): Payer: Medicare Other | Admitting: Infectious Diseases

## 2016-08-28 VITALS — BP 107/68 | HR 89 | Temp 97.8°F

## 2016-08-28 DIAGNOSIS — T8453XD Infection and inflammatory reaction due to internal right knee prosthesis, subsequent encounter: Secondary | ICD-10-CM | POA: Diagnosis not present

## 2016-08-28 DIAGNOSIS — T827XXD Infection and inflammatory reaction due to other cardiac and vascular devices, implants and grafts, subsequent encounter: Secondary | ICD-10-CM

## 2016-08-28 DIAGNOSIS — Z96651 Presence of right artificial knee joint: Secondary | ICD-10-CM | POA: Diagnosis not present

## 2016-08-28 DIAGNOSIS — T8484XD Pain due to internal orthopedic prosthetic devices, implants and grafts, subsequent encounter: Secondary | ICD-10-CM | POA: Diagnosis not present

## 2016-08-28 DIAGNOSIS — T827XXA Infection and inflammatory reaction due to other cardiac and vascular devices, implants and grafts, initial encounter: Secondary | ICD-10-CM | POA: Diagnosis not present

## 2016-08-28 DIAGNOSIS — T8453XA Infection and inflammatory reaction due to internal right knee prosthesis, initial encounter: Secondary | ICD-10-CM | POA: Diagnosis not present

## 2016-08-28 LAB — CBC
HCT: 30.3 % — ABNORMAL LOW (ref 35.0–45.0)
HEMOGLOBIN: 9.5 g/dL — AB (ref 11.7–15.5)
MCH: 29.3 pg (ref 27.0–33.0)
MCHC: 31.4 g/dL — ABNORMAL LOW (ref 32.0–36.0)
MCV: 93.5 fL (ref 80.0–100.0)
MPV: 9.4 fL (ref 7.5–12.5)
Platelets: 425 10*3/uL — ABNORMAL HIGH (ref 140–400)
RBC: 3.24 MIL/uL — AB (ref 3.80–5.10)
RDW: 14.8 % (ref 11.0–15.0)
WBC: 5.8 10*3/uL (ref 3.8–10.8)

## 2016-08-28 NOTE — Progress Notes (Addendum)
Cardiology Office Note Date:  08/29/2016  Patient ID:  [name] [name], [name] [name] June 19, 1935, MRN [id number] PCP:  [name], MD  Cardiologist:  Dr. Delton See Electrophysiologist: Dr. Graciela Husbands   Chief Complaint: SOB  History of Present Illness: [name] [name] is a 81 y.o. female with history of NICM w/hx of CRT-D though extracted 2/2 recurrent MSSA bacteremia,  asthma, LBBB, chronic CHF (systolic), hypothyroidism.  She was admitted to [facility] initially 6/7-18 with MSSA bacteremia of uncertain etiology, during this time severe sepsis and had 2/2 blood cultures positive on 6/7.  She had an LP and a CT chest that suggested RML and lingular infiltrates.  She was treated at various times with Vanc, Rocephin, Levaquin, and Doxy and she was eventually changed to Ancef per ID recommendations and completed 4 weeks of therapy.  TTE and TEE were negative.  She "felt great" upon completing her antibiotics on 7/5.    She had a bilateral TKR 14 years ago and on Thursday (8/2) noticed R knee stiffness/pain.  On Friday (8/3) it was swollen and hot, presented to [facility] 08/11/16 with acutely increased swelling and pain, in the ER Grossly purulent fluid on right knee arthrocentesis, admitted with septic arthritis and underwent I&D of R knee with ply exchange.  TEE this admission was + for vegetation on RA lead, and transferred to [facility] for device extraction (given Dr. Ladona Ridgel was away).  She underwent extraction of her device by Dr. Gary Fleet on 08/16/16.  She comes today accompanied by her husband.  She is doing pretty well, ambulating with a walker, remains with PICC line and saw ID earlier this week, reports being told another 4 weeks of IV antibiotics and likely will need lifelong oral antibiotics afterwards.  She denies any kind of CP, no palpitations or SOB, no dizziness, near syncope or syncope.  She did not want to have follow up for wound care/check at Dr. Shon Baton, wanted to come back here.  She feels like she is  making slow steady progress, no fever or symptoms of illness.  DEVICE information: Biotronik CRT-D, implanted 07/04/14, Dr. Graciela Husbands EXTRACTED 2/2 MSSA bacteremia 08/16/16, Dr. Gary Fleet at [facility]  Past Medical History:  Diagnosis Date  . 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 Arthritis    "finger joints" (06/21/2016)  . Asthma   . CHF (congestive heart failure) (HCC)   . Congestive dilated cardiomyopathy (HCC) 03/31/2014   a. s/p Biotronik CRTD 06/2014  . GERD (gastroesophageal reflux disease)   . History of blood transfusion 1990s   "related to hysterectomy"  . Hypothyroidism   . Left bundle branch block   . MSSA bacteremia 06/21/2016  . Myocardial infarction (HCC)   . Pancreatitis, acute     Past Surgical History:  Procedure Laterality Date  . ABDOMINAL HYSTERECTOMY    . APPENDECTOMY    . CARDIAC CATHETERIZATION    . CARDIAC DEFIBRILLATOR PLACEMENT  ?2016  . CATARACT EXTRACTION W/ INTRAOCULAR LENS  IMPLANT, BILATERAL Bilateral   . CHOLECYSTECTOMY OPEN     "related to pancreatitis"  . DILATION AND CURETTAGE OF UTERUS    . EP IMPLANTABLE DEVICE N/A 07/04/2014   Procedure: BiV ICD Insertion CRT-D;  Surgeon: Duke Salvia, MD;  Location: [facility] INVASIVE CV LAB;  Service: Cardiovascular;  Laterality: N/A;  . HERNIA REPAIR    . I&D KNEE WITH POLY EXCHANGE Right 08/11/2016   Procedure: IRRIGATION  AND DEBRIDEMENT KNEE WITH POLY EXCHANGE;  Surgeon: Samson Frederic, MD;  Location: [facility] OR;  Service: Orthopedics;  Laterality: Right;  . INSERT / REPLACE / REMOVE PACEMAKER  ?2016  . JOINT REPLACEMENT Bilateral    "thumb joints; used my own material"  . JOINT REPLACEMENT    . LEFT HEART CATHETERIZATION WITH CORONARY ANGIOGRAM N/A 11/02/2013   Procedure: LEFT HEART CATHETERIZATION WITH CORONARY ANGIOGRAM;  Surgeon: Corky Crafts, MD;  Location: [facility] CATH LAB;  Service: Cardiovascular;  Laterality: N/A;  .  LOOP RECORDER IMPLANT N/A 04/13/2014   Procedure: LOOP RECORDER IMPLANT;  Surgeon: Duke Salvia, MD;  Location: [facility] CATH LAB;  Service: Cardiovascular;  Laterality: N/A;  . LOOP RECORDER REMOVAL  ?2016  . POSTERIOR LUMBAR FUSION  05/03/2016   Lumbar Two-Three, Lumbar Three-Four Posterior Lumbar Fusion withLaminectomy and Foraminotomy  . SPINE SURGERY  04/2016   lumbar   . TEE WITHOUT CARDIOVERSION N/A 08/14/2016   Procedure: TRANSESOPHAGEAL ECHOCARDIOGRAM (TEE);  Surgeon: Lars Masson, MD;  Location: [facility] ENDOSCOPY;  Service: Cardiovascular;  Laterality: N/A;  . TONSILLECTOMY    . TOTAL HIP ARTHROPLASTY Bilateral   . UMBILICAL HERNIA REPAIR  1970s?    Current Outpatient Prescriptions  Medication Sig Dispense Refill  . albuterol (PROAIR HFA) 108 (90 Base) MCG/ACT inhaler Inhale 2 puffs into the lungs every 6 (six) hours as needed for wheezing or shortness of breath.    [name] alum & mag hydroxide-simeth (MAALOX/MYLANTA) 200-200-20 MG/5ML suspension Take 30 mLs by mouth every 4 (four) hours as needed for indigestion. 355 mL 0  . aspirin EC 81 MG tablet Take 81 mg by mouth daily.    [name] aspirin-acetaminophen-caffeine (EXCEDRIN MIGRAINE) 250-250-65 MG per tablet Take 1-2 tablets by mouth every 6 (six) hours as needed for headache or migraine.     [name] augmented betamethasone dipropionate (DIPROLENE-AF) 0.05 % ointment Apply 1 application topically daily as needed (acrodermatitis [place] on fingers/toes).     . Calcium Carbonate Antacid (TUMS PO) Take 2 tablets by mouth daily as needed (indigestion).    . carvedilol (COREG) 12.5 MG tablet Take 1 tablet (12.5 mg total) by mouth 2 (two) times daily. 180 tablet 3  . ceFAZolin (ANCEF) 2-4 GM/100ML-% IVPB Inject 100 mLs (2 g total) into the vein every 8 (eight) hours. 1 each 0  . diphenhydrAMINE (BENADRYL) 12.5 MG/5ML elixir Take 5-10 mLs (12.5-25 mg total) by mouth every 4 (four) hours as needed for itching. 120 mL 0  . docusate sodium (COLACE) 100 MG  capsule Take 1 capsule (100 mg total) by mouth 2 (two) times daily. 10 capsule 0  . feeding supplement, ENSURE ENLIVE, (ENSURE ENLIVE) LIQD Take 237 mLs by mouth 2 (two) times daily between meals. 237 mL 12  . furosemide (LASIX) 20 MG tablet Take 1 tablet (20 mg total) by mouth daily as needed for edema. (Patient taking differently: Take 20 mg by mouth daily as needed for fluid or edema. ) 90 tablet 3  . HYDROcodone-acetaminophen (NORCO/VICODIN) 5-325 MG tablet Take 1-2 tablets by mouth every 4 (four) hours as needed for moderate pain or severe pain. 30 tablet 0  . HYDROmorphone (DILAUDID) 1 MG/ML injection Inject 0.5-2 mLs (0.5-2 mg total) into the vein every 2 (two) hours as needed for severe pain (unresolved breakthrough pain). 1 mL 0  . HYDROmorphone (DILAUDID) 1 MG/ML injection Inject 0.5-2 mLs (0.5-2 mg total) into the vein every 2 (two) hours as needed for severe pain (unresolved severe breakthrough pain after oral or oral  not tolerated). 1 mL 0  . levothyroxine (SYNTHROID, LEVOTHROID) 112 MCG tablet Take 112 mcg by mouth daily before breakfast.    . losartan (COZAAR) 50 MG tablet Take 1 tablet (50 mg total) by mouth daily. (Patient taking differently: Take 25-50 mg by mouth See admin instructions. 25mg  daily Then take an additional 25mg  when BP elevated) 30 tablet 11  . Melatonin 5 MG TABS Take 15 mg by mouth at bedtime as needed (for sleep.).     [name] menthol-cetylpyridinium (CEPACOL) 3 MG lozenge Take 1 lozenge (3 mg total) by mouth as needed for sore throat (sore throat). 100 tablet 12  . methocarbamol (ROBAXIN) 500 MG tablet Take 1 tablet (500 mg total) by mouth every 6 (six) hours as needed for muscle spasms. 10 tablet 0  . methylphenidate (RITALIN) 20 MG tablet Take 20 mg by mouth 2 (two) times daily as needed (narcolepsy).     [name] metoCLOPramide (REGLAN) 5 MG tablet Take 1-2 tablets (5-10 mg total) by mouth every 8 (eight) hours as needed for nausea (if ondansetron (ZOFRAN) ineffective.). 10  tablet 0  . Naphazoline-Pheniramine (ALLERGY EYE OP) Place 1-2 drops into both eyes daily as needed (for irritated eyes).    . ondansetron (ZOFRAN) 4 MG tablet Take 0.5 tablets (2 mg total) by mouth every 8 (eight) hours as needed for nausea or vomiting. 20 tablet 3  . ondansetron (ZOFRAN) 4 MG tablet Take 1 tablet (4 mg total) by mouth every 6 (six) hours as needed for nausea. 20 tablet 0  . phenol (CHLORASEPTIC) 1.4 % LIQD Use as directed 1 spray in the mouth or throat as needed for throat irritation / pain. 1 Bottle 0  . polyethylene glycol (MIRALAX / GLYCOLAX) packet Take 17 g by mouth daily as needed for mild constipation. 14 each 0  . Probiotic Product (PROBIOTIC PO) Take 1 capsule by mouth at bedtime.     . rifampin (RIFADIN) 300 MG capsule Take 1 capsule (300 mg total) by mouth every 12 (twelve) hours. 10 capsule 0  . senna (SENOKOT) 8.6 MG TABS tablet Take 2 tablets (17.2 mg total) by mouth at bedtime. 120 each 0   No current facility-administered medications for this visit.     Allergies:   Sulfa antibiotics   Social History:  The patient  reports that she has never smoked. She has never used smokeless tobacco. She reports that she drinks alcohol. She reports that she does not use drugs.   Family History:  The patient's family history includes Arrhythmia in her mother; Heart attack in her father, maternal grandfather, and paternal grandfather; Heart disease in her father; Hyperlipidemia in her mother.  ROS:  Please see the history of present illness.  All other systems are reviewed and otherwise negative.   PHYSICAL EXAM:  VS:  BP 122/80   Pulse 64   Ht 4\' 10"  (1.473 m)   Wt 151 lb 9.6 oz (68.8 kg)   LMP  (LMP Unknown)   BMI 31.68 kg/m  BMI: Body mass index is 31.68 kg/m. Well nourished, well developed, in no acute distress  HEENT: normocephalic, atraumatic  Neck: no JVD, carotid bruits or masses Cardiac: RRR ; no significant murmurs, no rubs, or gallops Lungs: CTA b/l,  no wheezing, rhonchi or rales  Abd: soft, nontender MS: no deformity or atrophy Ext: trace-1+no edema is appreciated, RUE picc line, RLE with surgical scar, is dry Skin: warm and dry, no rash Neuro:  No gross deficits appreciated Psych: euthymic mood, full  affect  ICD extraction site: dressing/steri strips were removed well healed.  No erythema, edema, no increased heat to the tissues, no fluid collection or tenderness.   EKG:  Done today and reviewed by myself is SR, LBBB, QRS  08/14/16: TEE Study Conclusions - Left ventricle: Systolic function was normal. The estimated   ejection fraction was in the range of 55% to 60%. Wall motion was   normal; there were no regional wall motion abnormalities. - Aortic valve: No evidence of vegetation. There was mild   regurgitation. - Aorta: There was mild non-mobile atheroma. - Mitral valve: There was mild regurgitation. - Left atrium: No evidence of thrombus in the atrial cavity or   appendage. No evidence of thrombus in the atrial cavity or   appendage. - Right atrium: No evidence of thrombus in the atrial cavity or   appendage. No evidence of thrombus in the atrial cavity or   appendage. - Tricuspid valve: No evidence of vegetation. There was mild   regurgitation. - Pulmonic valve: No evidence of vegetation. Impressions: - There is a highly mobile echodensity measuring 13 x 3 mm attached   to the right atrial lead in the right atrium at the tricuspid   valve level. This is consistent with pacemaker endocarditis.   10/17/15: limited echo for AV optimization Impressions: - The study is performed for AV optimization   There is grade 1 diastolic dysfunction   The optimal AV delay seems to be around 90-100 ms but i would   defer to the doctors managing the pacer.  02/01/15: TTE Study Conclusions - Left ventricle: The cavity size was normal. There was moderate   concentric hypertrophy. Systolic function was normal. The    estimated ejection fraction was in the range of 50% to 55%. Wall   motion was normal; there were no regional wall motion   abnormalities. Doppler parameters are consistent with abnormal   left ventricular relaxation (grade 1 diastolic dysfunction). The   E/e&' ratio is >15, suggesting elevated LV filling pressure. - Mitral valve: Calcified annulus. Mildly thickened leaflets .   There was trivial regurgitation. - Left atrium: The atrium was normal in size. - Right ventricle: The cavity size was normal. Wall thickness was   normal. Pacer wire or catheter noted in right ventricle. Systolic   function was normal. - Right atrium: The atrium was normal in size. Pacer wire or   catheter noted in right atrium. - Tricuspid valve: There was moderate regurgitation. - Pulmonary arteries: PA peak pressure: 30 mm Hg (S). - Inferior vena cava: The vessel was normal in size. The   respirophasic diameter changes were in the normal range (= 50%),   consistent with normal central venous pressure. Impressions: - Compared to a prior study in 09/2014, the EF has improved to   50-55%.  Recent Labs: 12/07/2015: Brain Natriuretic Peptide 33.0 06/22/2016: Magnesium 1.9 08/12/2016: TSH 0.995 08/28/2016: ALT <3; BUN 12; Creat 0.70; Hemoglobin 9.5; Platelets 425; Potassium 4.0; Sodium 136  12/07/2015: Cholesterol 231; HDL 61; LDL Cholesterol 144; Total CHOL/HDL Ratio 3.8; Triglycerides 131; VLDL 26   Estimated Creatinine Clearance: 47.7 mL/min (by C-G formula based on SCr of 0.7 mg/dL).   Wt Readings from Last 3 Encounters:  08/29/16 151 lb 9.6 oz (68.8 kg)  08/11/16 150 lb (68 kg)  07/24/16 150 lb (68 kg)     Other studies reviewed: Additional studies/records reviewed today include: summarized above  ASSESSMENT AND PLAN:  Dr. Delton See, saw patient today to  check in, this made the patient very happy.  1. NICM with interval improvement of her EF     No SOB/DOE     She has minimal LE edema      Take her  lasix daily for 5 days then resume PRN  2. CRT-D     She is s/p device extraction with recurrent MSSA bacteremia and vegetation     She has had interval improvement of LVEF and remains 55-60%     No plans for re-implant at this time (Dr. Graciela Husbands discussed this with the patient's husband via telephone last week)  3. HTN     looks good, no changes    Disposition: She will follow up with Dr. Delton See next month, will follow her EF, management  Current medicines are reviewed at length with the patient today.  The patient did not have any concerns regarding medicines.  Judith Blonder, PA-C 08/29/2016 5:33 PM     [facility] [address] [place] [address] [phone] (office)  [phone] (fax)

## 2016-08-28 NOTE — Assessment & Plan Note (Signed)
Doing well post operatively and tolerating antibiotics well. Will continue IV Ancef and Rifampin by mouth for 6 weeks (day 11/42). Although she was not bacteremic during this hospitalization would have the start date for tx 08/17/16 after infected device was removed and poly exchange was done. She will need long term oral treatment as well. [facility] ID team suggested minocycline + rifampin, however sensitivities of knee aspirate and initial bcx were sensitive to oxacillin; Keflex + Rifampin I believe would be preferred in this setting with a duration of 6 months. Regarding long-term planning for [name] [name] with her having had lumbar surgery, bilateral knee replacements it may be good idea to consider longer suppressive therapy. Will have her follow up again with Dr. Daiva Eves or myself in 4 weeks prior to completion of IV abx to discuss. Hip pain is improving; native hip without hardware - CT of pelvis showed some stranding and inflammatory changes to hip but no concern for joint effusion requiring intervention. We discussed today that should this fail to resolve in the next 1-2 weeks or worsens we should re-image hip to see if joint arthrocentesis is warranted. If this is/was SA she should over the course of 6 weeks be well treated for this.

## 2016-08-28 NOTE — Assessment & Plan Note (Signed)
Device has been extracted d/t lead endocarditis. She has had some improvement in EF which is now 55 - 60%. Hopefully she has recovered enough functional improvement to avoid need for re-insertion. As previously recommended would complete the entire course of IV antibiotic therapy before this is considered as I believe she could use life vest in the interim should things change.

## 2016-08-28 NOTE — Patient Instructions (Addendum)
Continue IV antibiotics through September 22nd (start date will be after defibrillator was extracted on 08/17/16)  We will see you back prior to stopping this with Dr. Daiva Eves to get you started on oral regimen   Wonderful to see you and I hope you continue to improve.

## 2016-08-28 NOTE — Progress Notes (Signed)
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 Olin Pia Orthopedic Provider: Dr. Maureen Ralphs   INFECTIOUS DISEASE OFFICE VISIT SUBJECTIVE:  HPI: [name] [name] is a 81 y.o. female here today after hospitalization for MSSA right knee PJI and  concurrent ICD endocarditis.   Prior to this hospitalization [name] [name] underwent lumbar laminectomy by Dr. Ronnald Ramp on 05/03/16. Presented to [facility] 6/7 with fevers, weakness and diffuse pain. She developed sepsis/hypotension and was found to have MSSA bacteremia and transferred to [facility] 6/15 for continued care and assessment by EP team considering she has cardiac device. At [facility] she underwent evaluation for metastatic sites of infection including CT of spine, TTE/TEE and BCx monitoring. All repeat BCx were negative once transferred here. TTE/TEE were both negative for lead or valvular endocarditis at that time; received 4 weeks IV Ancef and decision to leave cardiac device. She followed up with me on 07/11/16 - at that time no f/c and felt much better but endorsed right plantar forefoot/first MTP and ankle pain. XRay of foot/ankle showed changes in bones related to osteoarthritis specifically in the great toe joint and bone spur formation in her midfoot. Two sets of clearance blood cultures were obtained between ID office and EP team 7/9 and 7/18 and all were negative.   On 08/11/16 a month later she was readmitted to the hospital with a 3 day history of worsening pain and swelling of her right prosthetic knee. Aspiration revealed over 23k WBCs, 80% neutrophils that was positive for MSSA; Blood cultures on 8/5 and 8/9 were negative. 8/5 underwent polyethylene liner exchange of knee by Dr. Delfino Lovett. 8/9 TEE revealed pacemaker endocarditis with mobile echodensity measuring 13 x 3 mm attached to the right atrial lead in the right atrium at  the tricuspid valve level. She was seen by Dr. Tommy Medal in the hospital during this admission and was recommended to have PPM extracted with 6w of IV Ancef + Rifampin therapy. She was transferred to [facility] for pacemaker extraction on 08/16/16 as Dr. Lovena Le was unable to perform this here at [facility]. She was followed by the ID team at [facility] while inpatient and was recommended 6w of IV ancef + rifampin post extraction (8/11 start date) followed by minocycline + rifampin PO x 4.5 months.   Since her discharge from [facility] she has been at [facility] in [place], [place]. Went for follow up with Dr. Wynelle Link and had staples removed recently. Right knee is still swollen and stiff however improved. She is concerned it still feels warm today. Left knee (also with prosthesis) is without pain, swelling or erythema. Periodically with stretching and weight bearing she will still have shooting pains to her right inner groin/hip but overall improved. Denies fevers, chills, diarrhea; does have some lower back pain that is relieved with robaxin and she attributes to the "terrible hospital beds." Looking forward to going home in a few weeks. PICC line is in RUA and giving her no trouble. Pacemaker pocket is unremarkable and she has followed up with cardiology team.    Patient's Medications  New Prescriptions   No medications on file  Previous Medications   ALBUTEROL (PROAIR HFA) 108 (90 BASE) MCG/ACT INHALER    Inhale 2 puffs into the lungs every 6 (six) hours as needed for wheezing or shortness of breath.   ALUM & MAG  HYDROXIDE-SIMETH (MAALOX/MYLANTA) 200-200-20 MG/5ML SUSPENSION    Take 30 mLs by mouth every 4 (four) hours as needed for indigestion.   ASPIRIN EC 81 MG TABLET    Take 81 mg by mouth daily.   ASPIRIN-ACETAMINOPHEN-CAFFEINE (EXCEDRIN MIGRAINE) 250-250-65 MG PER TABLET    Take 1-2 tablets by mouth every 6 (six) hours as needed for headache or migraine.    AUGMENTED BETAMETHASONE DIPROPIONATE (DIPROLENE-AF) 0.05 %  OINTMENT    Apply 1 application topically daily as needed (acrodermatitis [place] on fingers/toes).    CALCIUM CARBONATE ANTACID (TUMS PO)    Take 2 tablets by mouth daily as needed (indigestion).   CARVEDILOL (COREG) 12.5 MG TABLET    Take 1 tablet (12.5 mg total) by mouth 2 (two) times daily.   CEFAZOLIN (ANCEF) 2-4 GM/100ML-% IVPB    Inject 100 mLs (2 g total) into the vein every 8 (eight) hours.   DIPHENHYDRAMINE (BENADRYL) 12.5 MG/5ML ELIXIR    Take 5-10 mLs (12.5-25 mg total) by mouth every 4 (four) hours as needed for itching.   DOCUSATE SODIUM (COLACE) 100 MG CAPSULE    Take 1 capsule (100 mg total) by mouth 2 (two) times daily.   FEEDING SUPPLEMENT, ENSURE ENLIVE, (ENSURE ENLIVE) LIQD    Take 237 mLs by mouth 2 (two) times daily between meals.   FUROSEMIDE (LASIX) 20 MG TABLET    Take 1 tablet (20 mg total) by mouth daily as needed for edema.   HYDROCODONE-ACETAMINOPHEN (NORCO/VICODIN) 5-325 MG TABLET    Take 1-2 tablets by mouth every 4 (four) hours as needed for moderate pain or severe pain.   HYDROMORPHONE (DILAUDID) 1 MG/ML INJECTION    Inject 0.5-2 mLs (0.5-2 mg total) into the vein every 2 (two) hours as needed for severe pain (unresolved breakthrough pain).   HYDROMORPHONE (DILAUDID) 1 MG/ML INJECTION    Inject 0.5-2 mLs (0.5-2 mg total) into the vein every 2 (two) hours as needed for severe pain (unresolved severe breakthrough pain after oral or oral not tolerated).   LEVOTHYROXINE (SYNTHROID, LEVOTHROID) 112 MCG TABLET    Take 112 mcg by mouth daily before breakfast.   LOSARTAN (COZAAR) 50 MG TABLET    Take 1 tablet (50 mg total) by mouth daily.   MELATONIN 5 MG TABS    Take 15 mg by mouth at bedtime as needed (for sleep.).    MENTHOL-CETYLPYRIDINIUM (CEPACOL) 3 MG LOZENGE    Take 1 lozenge (3 mg total) by mouth as needed for sore throat (sore throat).   METHOCARBAMOL (ROBAXIN) 500 MG TABLET    Take 1 tablet (500 mg total) by mouth every 6 (six) hours as needed for muscle spasms.     METHYLPHENIDATE (RITALIN) 20 MG TABLET    Take 20 mg by mouth 2 (two) times daily as needed (narcolepsy).    METOCLOPRAMIDE (REGLAN) 5 MG TABLET    Take 1-2 tablets (5-10 mg total) by mouth every 8 (eight) hours as needed for nausea (if ondansetron (ZOFRAN) ineffective.).   NAPHAZOLINE-PHENIRAMINE (ALLERGY EYE OP)    Place 1-2 drops into both eyes daily as needed (for irritated eyes).   ONDANSETRON (ZOFRAN) 4 MG TABLET    Take 0.5 tablets (2 mg total) by mouth every 8 (eight) hours as needed for nausea or vomiting.   ONDANSETRON (ZOFRAN) 4 MG TABLET    Take 1 tablet (4 mg total) by mouth every 6 (six) hours as needed for nausea.   PHENOL (CHLORASEPTIC) 1.4 % LIQD    Use as directed 1  spray in the mouth or throat as needed for throat irritation / pain.   POLYETHYLENE GLYCOL (MIRALAX / GLYCOLAX) PACKET    Take 17 g by mouth daily as needed for mild constipation.   PROBIOTIC PRODUCT (PROBIOTIC PO)    Take 1 capsule by mouth at bedtime.    RIFAMPIN (RIFADIN) 300 MG CAPSULE    Take 1 capsule (300 mg total) by mouth every 12 (twelve) hours.   SENNA (SENOKOT) 8.6 MG TABS TABLET    Take 2 tablets (17.2 mg total) by mouth at bedtime.  Modified Medications   No medications on file  Discontinued Medications   No medications on file    Review of Systems  Constitutional: Positive for malaise/fatigue. Negative for chills and fever.  Respiratory: Negative for cough and shortness of breath.   Cardiovascular: Positive for leg swelling. Negative for chest pain and palpitations.  Gastrointestinal: Positive for nausea. Negative for diarrhea and vomiting.  Genitourinary: Negative for dysuria.  Musculoskeletal: Positive for back pain (spasms to lower right lumbar region) and joint pain (right knee soreness following surgery).  Skin: Negative for rash.  Neurological: Positive for tingling and weakness. Negative for headaches.    Past Medical History:  Diagnosis Date  . Acrodermatitis continua of [place]     "was in remission for 5 years the 1st time; ~ 7 years the second time" (06/21/2016)  . Arthritis    "finger joints" (06/21/2016)  . Asthma   . CHF (congestive heart failure) ([place])   . Congestive dilated cardiomyopathy ([place]) 03/31/2014   a. s/p Biotronik CRTD 06/2014  . GERD (gastroesophageal reflux disease)   . History of blood transfusion 1990s   "related to hysterectomy"  . Hypothyroidism   . Left bundle branch block   . MSSA bacteremia 06/21/2016  . Myocardial infarction ([place])   . Pancreatitis, acute     Allergies  Allergen Reactions  . Sulfa Antibiotics Hives    OBJECTIVE: Vitals:   08/28/16 1440  BP: 107/68  Pulse: 89  Temp: 97.8 F (36.6 C)  TempSrc: Oral   There is no height or weight on file to calculate BMI.  Physical Exam  Constitutional: She is oriented to person, place, and time and well-developed, well-nourished, and in no distress.  Pleasant elderly woman seated in wheelchair accompanied by her husband.   HENT:  Mouth/Throat: Oropharynx is clear and moist. No oral lesions. No dental abscesses.  Eyes: No scleral icterus.  Cardiovascular: Normal rate, regular rhythm and normal heart sounds.   Pulmonary/Chest: Effort normal and breath sounds normal.  Abdominal: Soft. She exhibits no distension. There is no tenderness.  Musculoskeletal: Normal range of motion. She exhibits no tenderness.  Lymphadenopathy:    She has no cervical adenopathy.  Neurological: She is alert and oriented to person, place, and time.  Skin: Skin is warm and dry. No rash noted.  Psychiatric: Mood and affect normal.    08/16/16 XRay of Right Knee @ [facility]:   ASSESSMENT & PLAN:  Problem List Items Addressed This Visit      Musculoskeletal and Integument   Infection of total right knee replacement ([place]) - Primary    Doing well post operatively and tolerating antibiotics well. Will continue IV Ancef and Rifampin by mouth for 6 weeks (day 11/42). Although she was not bacteremic  during this hospitalization would have the start date for tx 08/17/16 after infected device was removed and poly exchange was done. She will need long term oral treatment as well. [facility] ID team  suggested minocycline + rifampin, however sensitivities of knee aspirate and initial bcx were sensitive to oxacillin; Keflex + Rifampin I believe would be preferred in this setting with a duration of 6 months. Regarding long-term planning for Ms. Wombles with her having had lumbar surgery, bilateral knee replacements it may be good idea to consider longer suppressive therapy. Will have her follow up again with Dr. Tommy Medal or myself in 4 weeks prior to completion of IV abx to discuss. Hip pain is improving; native hip without hardware - CT of pelvis showed some stranding and inflammatory changes to hip but no concern for joint effusion requiring intervention. We discussed today that should this fail to resolve in the next 1-2 weeks or worsens we should re-image hip to see if joint arthrocentesis is warranted. If this is/was SA she should over the course of 6 weeks be well treated for this.       Relevant Orders   CBC (Completed)   Comprehensive metabolic panel (Completed)   C-reactive protein (Completed)   Sed Rate (ESR) (Completed)     Other   Infected defibrillator [facility])    Device has been extracted d/t lead endocarditis. She has had some improvement in EF which is now 55 - 60%. Hopefully she has recovered enough functional improvement to avoid need for re-insertion. As previously recommended would complete the entire course of IV antibiotic therapy before this is considered as I believe she could use life vest in the interim should things change.          Janene Madeira, MSN, [facility]  08/28/2016  10:04 PM

## 2016-08-29 ENCOUNTER — Ambulatory Visit (INDEPENDENT_AMBULATORY_CARE_PROVIDER_SITE_OTHER): Payer: Medicare Other | Admitting: Physician Assistant

## 2016-08-29 ENCOUNTER — Encounter: Payer: Self-pay | Admitting: Physician Assistant

## 2016-08-29 VITALS — BP 122/80 | HR 64 | Ht <= 58 in | Wt 151.6 lb

## 2016-08-29 DIAGNOSIS — I428 Other cardiomyopathies: Secondary | ICD-10-CM

## 2016-08-29 DIAGNOSIS — I10 Essential (primary) hypertension: Secondary | ICD-10-CM | POA: Diagnosis not present

## 2016-08-29 DIAGNOSIS — I447 Left bundle-branch block, unspecified: Secondary | ICD-10-CM | POA: Diagnosis not present

## 2016-08-29 LAB — C-REACTIVE PROTEIN: CRP: 63.1 mg/L — AB (ref <8.0)

## 2016-08-29 LAB — COMPREHENSIVE METABOLIC PANEL
ALBUMIN: 3.4 g/dL — AB (ref 3.6–5.1)
ALK PHOS: 81 U/L (ref 33–130)
AST: 14 U/L (ref 10–35)
BUN: 12 mg/dL (ref 7–25)
CHLORIDE: 102 mmol/L (ref 98–110)
CO2: 23 mmol/L (ref 20–32)
CREATININE: 0.7 mg/dL (ref 0.60–0.93)
Calcium: 9.1 mg/dL (ref 8.6–10.4)
Glucose, Bld: 111 mg/dL — ABNORMAL HIGH (ref 65–99)
Potassium: 4 mmol/L (ref 3.5–5.3)
Sodium: 136 mmol/L (ref 135–146)
TOTAL PROTEIN: 6.7 g/dL (ref 6.1–8.1)
Total Bilirubin: 0.9 mg/dL (ref 0.2–1.2)

## 2016-08-29 LAB — SEDIMENTATION RATE: SED RATE: 85 mm/h — AB (ref 0–30)

## 2016-08-29 NOTE — Addendum Note (Signed)
Addendum  created 08/29/16 1342 by Kipp Brood, MD   Sign clinical note

## 2016-08-29 NOTE — Patient Instructions (Signed)
Medication Instructions:  Your physician has recommended you make the following change in your medication: 1.) START taking your Lasix everyday for 5 days then just as needed.   Labwork: None Ordered   Testing/Procedures: None Ordered   Follow-Up: Your physician recommends that you schedule a follow-up appointment in: please schedule patient in the September 18 slot at 10am per Dr. Delton See.  Any Other Special Instructions Will Be Listed Below (If Applicable).  Please keep a check on your daily weights.    If you need a refill on your cardiac medications before your next appointment, please call your pharmacy.

## 2016-08-30 NOTE — Progress Notes (Signed)
CRP 63.1 and Sed 85. She is pretty fresh out of surgery from poly exchange and ICD removal; swelling present to knee but would expect this at this stage. She is on appropriate medical therapy with Rifampin + Ancef for MSSA PJI. Will continue to follow for now.

## 2016-09-02 ENCOUNTER — Other Ambulatory Visit: Payer: Self-pay | Admitting: Cardiology

## 2016-09-02 DIAGNOSIS — R072 Precordial pain: Secondary | ICD-10-CM

## 2016-09-02 DIAGNOSIS — M5440 Lumbago with sciatica, unspecified side: Secondary | ICD-10-CM

## 2016-09-02 DIAGNOSIS — I420 Dilated cardiomyopathy: Secondary | ICD-10-CM

## 2016-09-02 DIAGNOSIS — I5022 Chronic systolic (congestive) heart failure: Secondary | ICD-10-CM

## 2016-09-11 ENCOUNTER — Telehealth: Payer: Self-pay

## 2016-09-11 MED ORDER — RIFAMPIN 300 MG PO CAPS: 300.0000 mg | ORAL_CAPSULE | Freq: Two times a day (BID) | ORAL | 1 refills | Status: DC

## 2016-09-11 MED ORDER — CEFTRIAXONE SODIUM 2 G IV SOLR: 2.0000 g | INTRAVENOUS | 0 refills | Status: DC

## 2016-09-11 NOTE — Telephone Encounter (Signed)
Thanks so much. Not sure why she was ever sent hom with IV rifampin

## 2016-09-11 NOTE — Telephone Encounter (Signed)
Patient's husband is calling.  They are struggling trying to make sure the IV antibiotics are administered correctly.  He says it is way too much for them to handle.  They are requesting a different route of medication or regimen. Anything that is simpler  Her husband is willing to do what is best but he admits this it too much for them at their age.  Their return appointment is 09-24-16.   I will speak with Dr Daiva Eves for advice.    On return call to patient her husband stated he was confused about the names of the antibiotics so I asked him to read the names directly from the medications per husband   current regimen :  Rifampin  @ 3 pm and Ancef four times daily via PICC.   Per Dr Daiva Eves to simplify regimen:  Rifampin 300 mg tablets twice daily.                                                                         Ceftriaxone 2 grams IV push  Medication changes called to [facility] @ [phone] order . They will fax orders to sign on Thursday, September 12, 2016 Home Health Agency: [facility] [phone]  Patient's husband was instructed to discontinue the Ancef four times daily and the IV Rifampin.  Pharmacy [facility]) notified discontinue Ancef and Rifampin IV.  New orders given.    Laurell Josephs, RN

## 2016-09-16 ENCOUNTER — Other Ambulatory Visit: Payer: Self-pay | Admitting: Pharmacist

## 2016-09-24 ENCOUNTER — Encounter: Payer: Self-pay | Admitting: Cardiology

## 2016-09-24 ENCOUNTER — Encounter: Payer: Self-pay | Admitting: *Deleted

## 2016-09-24 ENCOUNTER — Ambulatory Visit (INDEPENDENT_AMBULATORY_CARE_PROVIDER_SITE_OTHER): Payer: Medicare Other | Admitting: Cardiology

## 2016-09-24 VITALS — BP 120/70 | HR 72 | Ht <= 58 in | Wt 150.0 lb

## 2016-09-24 DIAGNOSIS — I428 Other cardiomyopathies: Secondary | ICD-10-CM | POA: Diagnosis not present

## 2016-09-24 DIAGNOSIS — I5022 Chronic systolic (congestive) heart failure: Secondary | ICD-10-CM

## 2016-09-24 DIAGNOSIS — I10 Essential (primary) hypertension: Secondary | ICD-10-CM | POA: Diagnosis not present

## 2016-09-24 DIAGNOSIS — Z95810 Presence of automatic (implantable) cardiac defibrillator: Secondary | ICD-10-CM

## 2016-09-24 DIAGNOSIS — R7881 Bacteremia: Secondary | ICD-10-CM | POA: Diagnosis not present

## 2016-09-24 DIAGNOSIS — B9561 Methicillin susceptible Staphylococcus aureus infection as the cause of diseases classified elsewhere: Secondary | ICD-10-CM

## 2016-09-24 NOTE — Patient Instructions (Addendum)
Medication Instructions:   Your physician recommends that you continue on your current medications as directed. Please refer to the Current Medication list given to you today.    Testing/Procedures:  Your physician has requested that you have an echocardiogram. Echocardiography is a painless test that uses sound waves to create images of your heart. It provides your doctor with information about the size and shape of your heart and how well your heart's chambers and valves are working. This procedure takes approximately one hour. There are no restrictions for this procedure. PER DR NELSON PLEASE SCHEDULE THIS THE MORNING THE PATIENT IS SCHEDULED TO SEE DR NELSON ON 10/07/16.  SCHEDULE THIS BEFORE DR Delton See SEE'S THE PATIENT     Follow-Up:  ON 10/07/16 WITH DR NELSON---PLEASE HAVE YOUR ECHO SCHEDULED SAME DAY AS THIS APPOINTMENT, PRIOR TOO SEEING DR NELSON AT 10 AM       If you need a refill on your cardiac medications before your next appointment, please call your pharmacy.

## 2016-09-24 NOTE — Progress Notes (Signed)
Cardiology Office Note Date:  09/24/2016  Patient ID:  [name] [name], [place] 1935/01/31, MRN [id number] PCP:  [name], MD  Cardiologist:  Dr. Delton See Electrophysiologist: Dr. Graciela Husbands   Chief Complaint: SOB  History of Present Illness: IVANNAH [name] is a 81 y.o. female with history of NICM w/hx of CRT-D though extracted 2/2 recurrent MSSA bacteremia,  asthma, LBBB, chronic CHF (systolic), hypothyroidism.  She was admitted to [facility] initially 6/7-18 with MSSA bacteremia of uncertain etiology, during this time severe sepsis and had 2/2 blood cultures positive on 6/7.  She had an LP and a CT chest that suggested RML and lingular infiltrates.  She was treated at various times with Vanc, Rocephin, Levaquin, and Doxy and she was eventually changed to Ancef per ID recommendations and completed 4 weeks of therapy.  TTE and TEE were negative.  She "felt great" upon completing her antibiotics on 7/5.    She had a bilateral TKR 14 years ago and on Thursday (8/2) noticed R knee stiffness/pain.  On Friday (8/3) it was swollen and hot, presented to [facility] 08/11[facility]16 with acutely increased swelling and pain, in the ER Grossly purulent fluid on right knee arthrocentesis, admitted with septic arthritis and underwent I&D of R knee with ply exchange.  TEE this admission was + for vegetation on RA lead, and transferred to [facility] for device extraction (given Dr. Ladona Ridgel was away).  She underwent extraction of her device by Dr. Gary Fleet on 08/16/16.  08/29/2016  - She comes today accompanied by her husband.  She is doing pretty well, ambulating with a walker, remains with PICC line and saw ID earlier this week, reports being told another 4 weeks of IV antibiotics and likely will need lifelong oral antibiotics afterwards.  She denies any kind of CP, no palpitations or SOB, no dizziness, near syncope or syncope.  She did not want to have follow up for wound care/check at Dr. Shon Baton, wanted to come back here.  She feels  like she is making slow steady progress, no fever or symptoms of illness.  09/24/2016 - 3 weeks follow-up, the patient feels and looks significantly better. She finally Harris color in her cheeks and her energy level has improved significantly. She denies any chest pain or shortness of breath, she has persistent swelling and pain in her right knee that was operated on on 08/11/2016. Her right lower extremity has mild lower extremity edema but not the left lower extremity. She has no orthopnea or personal nocturnal dyspnea no dizziness palpitation or syncope. She has mild tenderness at night in her left upper chest wall where her biventricular ICD was explanted. She continues to use daily IV antibiotics, currently completing 6 weeks course and is following up with ID tomorrow. She might require lifelong antibiotics, hopefully PO.  DEVICE information: Biotronik CRT-D, implanted 07/04/14, Dr. Graciela Husbands EXTRACTED 2/2 MSSA bacteremia 08/16/16, Dr. Gary Fleet at [facility]  Past Medical History:  Diagnosis Date  . 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 Arthritis    "finger joints" (06/21/2016)  . Asthma   . CHF (congestive heart failure) (HCC)   . Congestive dilated cardiomyopathy (HCC) 03/31/2014   a. s/p Biotronik CRTD 06/2014  . GERD (gastroesophageal reflux disease)   . History of blood transfusion 1990s   "related to hysterectomy"  . Hypothyroidism   . Left bundle branch block   . MSSA bacteremia 06/21/2016  .  Myocardial infarction (HCC)   . Pancreatitis, acute     Past Surgical History:  Procedure Laterality Date  . ABDOMINAL HYSTERECTOMY    . APPENDECTOMY    . CARDIAC CATHETERIZATION    . CARDIAC DEFIBRILLATOR PLACEMENT  ?2016  . CATARACT EXTRACTION W/ INTRAOCULAR LENS  IMPLANT, BILATERAL Bilateral   . CHOLECYSTECTOMY OPEN     "related to pancreatitis"  . DILATION AND CURETTAGE OF UTERUS    . EP  IMPLANTABLE DEVICE N/A 07/04/2014   Procedure: BiV ICD Insertion CRT-D;  Surgeon: Duke Salvia, MD;  Location: [facility] INVASIVE CV LAB;  Service: Cardiovascular;  Laterality: N/A;  . HERNIA REPAIR    . I&D KNEE WITH POLY EXCHANGE Right 08/11/2016   Procedure: IRRIGATION AND DEBRIDEMENT KNEE WITH POLY EXCHANGE;  Surgeon: Samson Frederic, MD;  Location: [facility] OR;  Service: Orthopedics;  Laterality: Right;  . INSERT / REPLACE / REMOVE PACEMAKER  ?2016  . JOINT REPLACEMENT Bilateral    "thumb joints; used my own material"  . JOINT REPLACEMENT    . LEFT HEART CATHETERIZATION WITH CORONARY ANGIOGRAM N/A 11/02/2013   Procedure: LEFT HEART CATHETERIZATION WITH CORONARY ANGIOGRAM;  Surgeon: Corky Crafts, MD;  Location: [facility] CATH LAB;  Service: Cardiovascular;  Laterality: N/A;  . LOOP RECORDER IMPLANT N/A 04/13/2014   Procedure: LOOP RECORDER IMPLANT;  Surgeon: Duke Salvia, MD;  Location: [facility] CATH LAB;  Service: Cardiovascular;  Laterality: N/A;  . LOOP RECORDER REMOVAL  ?2016  . POSTERIOR LUMBAR FUSION  05/03/2016   Lumbar Two-Three, Lumbar Three-Four Posterior Lumbar Fusion withLaminectomy and Foraminotomy  . SPINE SURGERY  04/2016   lumbar   . TEE WITHOUT CARDIOVERSION N/A 08/14/2016   Procedure: TRANSESOPHAGEAL ECHOCARDIOGRAM (TEE);  Surgeon: Lars Masson, MD;  Location: [facility] ENDOSCOPY;  Service: Cardiovascular;  Laterality: N/A;  . TONSILLECTOMY    . TOTAL HIP ARTHROPLASTY Bilateral   . UMBILICAL HERNIA REPAIR  1970s?    Current Outpatient Prescriptions  Medication Sig Dispense Refill  . albuterol (PROAIR HFA) 108 (90 Base) MCG/ACT inhaler Inhale 2 puffs into the lungs every 6 (six) hours as needed for wheezing or shortness of breath.    [name] alum & mag hydroxide-simeth (MAALOX/MYLANTA) 200-200-20 MG/5ML suspension Take 30 mLs by mouth every 4 (four) hours as needed for indigestion. 355 mL 0  . aspirin EC 81 MG tablet Take 81 mg by mouth daily.    [name] aspirin-acetaminophen-caffeine (EXCEDRIN  MIGRAINE) 250-250-65 MG per tablet Take 1-2 tablets by mouth every 6 (six) hours as needed for headache or migraine.     [name] augmented betamethasone dipropionate (DIPROLENE-AF) 0.05 % ointment Apply 1 application topically daily as needed (acrodermatitis [place] on fingers/toes).     . Calcium Carbonate Antacid (TUMS PO) Take 2 tablets by mouth daily as needed (indigestion).    . carvedilol (COREG) 12.5 MG tablet Take 1 tablet (12.5 mg total) by mouth 2 (two) times daily. 180 tablet 3  . cefTRIAXone (ROCEPHIN) 2 g SOLR injection Inject 2 g into the vein daily. 13 each 0  . diphenhydrAMINE (BENADRYL) 12.5 MG/5ML elixir Take 5-10 mLs (12.5-25 mg total) by mouth every 4 (four) hours as needed for itching. 120 mL 0  . docusate sodium (COLACE) 100 MG capsule Take 1 capsule (100 mg total) by mouth 2 (two) times daily. 10 capsule 0  . feeding supplement, ENSURE ENLIVE, (ENSURE ENLIVE) LIQD Take 237 mLs by mouth 2 (two) times daily between meals. 237 mL 12  . furosemide (LASIX) 20 MG tablet TAKE 1  TABLET DAILY AS NEEDED FOR EDEMA 90 tablet 2  . HYDROmorphone (DILAUDID) 1 MG/ML injection Inject 0.5-2 mLs (0.5-2 mg total) into the vein every 2 (two) hours as needed for severe pain (unresolved severe breakthrough pain after oral or oral not tolerated). 1 mL 0  . levothyroxine (SYNTHROID, LEVOTHROID) 112 MCG tablet Take 112 mcg by mouth daily before breakfast.    . losartan (COZAAR) 50 MG tablet Take 1 tablet (50 mg total) by mouth daily. (Patient taking differently: Take 25-50 mg by mouth See admin instructions.  daily Then take an additional  when BP elevated) 30 tablet 11  . Melatonin 5 MG TABS Take 15 mg by mouth at bedtime as needed (for sleep.).     [name] menthol-cetylpyridinium (CEPACOL) 3 MG lozenge Take 1 lozenge (3 mg total) by mouth as needed for sore throat (sore throat). 100 tablet 12  . methocarbamol (ROBAXIN) 500 MG tablet Take 1 tablet (500 mg total) by mouth every 6 (six) hours as needed for  muscle spasms. 10 tablet 0  . methylphenidate (RITALIN) 20 MG tablet Take 20 mg by mouth 2 (two) times daily as needed (narcolepsy).     [name] metoCLOPramide (REGLAN) 5 MG tablet Take 1-2 tablets (5-10 mg total) by mouth every 8 (eight) hours as needed for nausea (if ondansetron (ZOFRAN) ineffective.). 10 tablet 0  . Naphazoline-Pheniramine (ALLERGY EYE OP) Place 1-2 drops into both eyes daily as needed (for irritated eyes).    . ondansetron (ZOFRAN) 4 MG tablet Take 0.5 tablets (2 mg total) by mouth every 8 (eight) hours as needed for nausea or vomiting. 20 tablet 3  . ondansetron (ZOFRAN) 4 MG tablet Take 1 tablet (4 mg total) by mouth every 6 (six) hours as needed for nausea. 20 tablet 0  . phenol (CHLORASEPTIC) 1.4 % LIQD Use as directed 1 spray in the mouth or throat as needed for throat irritation / pain. 1 Bottle 0  . polyethylene glycol (MIRALAX / GLYCOLAX) packet Take 17 g by mouth daily as needed for mild constipation. 14 each 0  . Probiotic Product (PROBIOTIC PO) Take 1 capsule by mouth at bedtime.     . rifampin (RIFADIN) 300 MG capsule Take 1 capsule (300 mg total) by mouth every 12 (twelve) hours. 60 capsule 1  . senna (SENOKOT) 8.6 MG TABS tablet Take 2 tablets (17.2 mg total) by mouth at bedtime. 120 each 0   No current facility-administered medications for this visit.     Allergies:   Sulfa antibiotics   Social History:  The patient  reports that she has never smoked. She has never used smokeless tobacco. She reports that she drinks alcohol. She reports that she does not use drugs.   Family History:  The patient's family history includes Arrhythmia in her mother; Heart attack in her father, maternal grandfather, and paternal grandfather; Heart disease in her father; Hyperlipidemia in her mother.  ROS:  Please see the history of present illness.  All other systems are reviewed and otherwise negative.   PHYSICAL EXAM:  VS:  BP 120/70   Pulse 72   Ht  (1.473 m)   Wt 150 lb  (68 kg)   LMP  (LMP Unknown)   BMI 31.35 kg/m  BMI: Body mass index is 31.35 kg/m. Well nourished, well developed, in no acute distress  HEENT: normocephalic, atraumatic  Neck: no JVD, carotid bruits or masses Cardiac: RRR ; no significant murmurs, no rubs, or gallops Lungs: CTA b/l, no wheezing, rhonchi or  rales , eschar in the left upper thoracic low post CRT-D explantation with no increase warmth or erythema, mild tenderness no discharge. Abd: soft, nontender MS: no deformity or atrophy Ext: trace-1+no edema in the right lower extremity, increased warmth around the knee joint with persistent mild swelling. Right upper extremity has a PICC line that doesn't seem to be infected. Skin: warm and dry, no rash Neuro:  No gross deficits appreciated Psych: euthymic mood, full affect  ICD extraction site: dressing/steri strips were removed well healed.  No erythema, edema, no increased heat to the tissues, no fluid collection or tenderness.   EKG:  Done today and reviewed by myself is SR, LBBB, QRS  08/14/16: TEE Study Conclusions - Left ventricle: Systolic function was normal. The estimated   ejection fraction was in the range of 55% to 60%. Wall motion was   normal; there were no regional wall motion abnormalities. - Aortic valve: No evidence of vegetation. There was mild   regurgitation. - Aorta: There was mild non-mobile atheroma. - Mitral valve: There was mild regurgitation. - Left atrium: No evidence of thrombus in the atrial cavity or   appendage. No evidence of thrombus in the atrial cavity or   appendage. - Right atrium: No evidence of thrombus in the atrial cavity or   appendage. No evidence of thrombus in the atrial cavity or   appendage. - Tricuspid valve: No evidence of vegetation. There was mild   regurgitation. - Pulmonic valve: No evidence of vegetation. Impressions: - There is a highly mobile echodensity measuring 13 x 3 mm attached   to the right atrial  lead in the right atrium at the tricuspid   valve level. This is consistent with pacemaker endocarditis.   10/17/15: limited echo for AV optimization Impressions: - The study is performed for AV optimization   There is grade 1 diastolic dysfunction   The optimal AV delay seems to be around 90-100 ms but i would   defer to the doctors managing the pacer.  02/01/15: TTE Study Conclusions - Left ventricle: The cavity size was normal. There was moderate   concentric hypertrophy. Systolic function was normal. The   estimated ejection fraction was in the range of 50% to 55%. Wall   motion was normal; there were no regional wall motion   abnormalities. Doppler parameters are consistent with abnormal   left ventricular relaxation (grade 1 diastolic dysfunction). The   E/e&' ratio is >15, suggesting elevated LV filling pressure. - Mitral valve: Calcified annulus. Mildly thickened leaflets .   There was trivial regurgitation. - Left atrium: The atrium was normal in size. - Right ventricle: The cavity size was normal. Wall thickness was   normal. Pacer wire or catheter noted in right ventricle. Systolic   function was normal. - Right atrium: The atrium was normal in size. Pacer wire or   catheter noted in right atrium. - Tricuspid valve: There was moderate regurgitation. - Pulmonary arteries: PA peak pressure: 30 mm Hg (S). - Inferior vena cava: The vessel was normal in size. The   respirophasic diameter changes were in the normal range (= 50%),   consistent with normal central venous pressure. Impressions: - Compared to a prior study in 09/2014, the EF has improved to   50-55%.  Recent Labs: 12/07/2015: Brain Natriuretic Peptide 33.0 06/22/2016: Magnesium 1.9 08/12/2016: TSH 0.995 08/28/2016: ALT <3; BUN 12; Creat 0.70; Hemoglobin 9.5; Platelets 425; Potassium 4.0; Sodium 136  12/07/2015: Cholesterol 231; HDL 61; LDL Cholesterol 144;  Total CHOL/HDL Ratio 3.8; Triglycerides 131; VLDL 26    CrCl cannot be calculated (Patient's most recent lab result is older than the maximum 21 days allowed.).   Wt Readings from Last 3 Encounters:  09/24/16 150 lb (68 kg)  08/29/16 151 lb 9.6 oz (68.8 kg)  08/11/16 150 lb (68 kg)     Other studies reviewed: Additional studies/records reviewed today include: summarized above    ASSESSMENT AND PLAN:  Dr. Delton See, saw patient today to check in, this made the patient very happy.  1. NICM with interval improvement of her EF 35-40% -->50-55% - s/p CRT-D explantation on 08/16/16 - I will schedule an echocardiogram to recheck LVEF she hasn't had one in a year. She has minimal LE edema in the leg that was operated on. Take her lasix PRN, approximately once a week.  2. CRT-D     She is s/p device extraction with recurrent MSSA bacteremia and vegetation    No plans for re-implant at this time (Dr. Graciela Husbands discussed this with the patient's husband via telephone last week), I will recheck her echocardiogram    Her wound is still sore but doesn't show any signs of swelling, increased warmth or discharge, will keep an eye on it, she is advised that it might be sore for approximately 3 months.  3. HTN     looks good, no changes   Disposition: Follow up on 10/07/2016.  Current medicines are reviewed at length with the patient today.  The patient did not have any concerns regarding medicines.  Signed, Tobias Alexander, MD 09/24/2016 11:24 AM

## 2016-09-25 ENCOUNTER — Telehealth: Payer: Self-pay | Admitting: *Deleted

## 2016-09-25 ENCOUNTER — Encounter: Payer: Self-pay | Admitting: Infectious Disease

## 2016-09-25 ENCOUNTER — Ambulatory Visit (INDEPENDENT_AMBULATORY_CARE_PROVIDER_SITE_OTHER): Payer: Medicare Other | Admitting: Infectious Disease

## 2016-09-25 VITALS — BP 152/82 | HR 84 | Temp 97.7°F | Ht <= 58 in | Wt 151.0 lb

## 2016-09-25 DIAGNOSIS — Z981 Arthrodesis status: Secondary | ICD-10-CM | POA: Diagnosis not present

## 2016-09-25 DIAGNOSIS — T827XXD Infection and inflammatory reaction due to other cardiac and vascular devices, implants and grafts, subsequent encounter: Secondary | ICD-10-CM | POA: Diagnosis not present

## 2016-09-25 DIAGNOSIS — T8453XA Infection and inflammatory reaction due to internal right knee prosthesis, initial encounter: Secondary | ICD-10-CM | POA: Diagnosis not present

## 2016-09-25 DIAGNOSIS — I420 Dilated cardiomyopathy: Secondary | ICD-10-CM | POA: Diagnosis not present

## 2016-09-25 DIAGNOSIS — T827XXA Infection and inflammatory reaction due to other cardiac and vascular devices, implants and grafts, initial encounter: Secondary | ICD-10-CM | POA: Diagnosis present

## 2016-09-25 DIAGNOSIS — T8453XD Infection and inflammatory reaction due to internal right knee prosthesis, subsequent encounter: Secondary | ICD-10-CM

## 2016-09-25 DIAGNOSIS — Z96651 Presence of right artificial knee joint: Secondary | ICD-10-CM | POA: Diagnosis not present

## 2016-09-25 DIAGNOSIS — I447 Left bundle-branch block, unspecified: Secondary | ICD-10-CM | POA: Diagnosis not present

## 2016-09-25 DIAGNOSIS — R7881 Bacteremia: Secondary | ICD-10-CM | POA: Diagnosis not present

## 2016-09-25 DIAGNOSIS — T8484XD Pain due to internal orthopedic prosthetic devices, implants and grafts, subsequent encounter: Secondary | ICD-10-CM

## 2016-09-25 MED ORDER — RIFAMPIN 300 MG PO CAPS: 300.0000 mg | ORAL_CAPSULE | Freq: Two times a day (BID) | ORAL | 11 refills | Status: DC

## 2016-09-25 MED ORDER — CEPHALEXIN 500 MG PO CAPS: 500.0000 mg | ORAL_CAPSULE | Freq: Four times a day (QID) | ORAL | 11 refills | Status: DC

## 2016-09-25 NOTE — Telephone Encounter (Signed)
Order per Dr. Daiva Eves faxed to [facility] to pull patient's picc line tomorrow phone [phone] and fax [phone]. Aundra Millet at [facility] stated that [facility] did have protocol orders to draw labs on patient and [facility] failed to do this. Said they needed to clarify with this office and we did not receive a phone call from home health.

## 2016-09-25 NOTE — Progress Notes (Signed)
Subjective:   Right knee pain   Patient ID: [name] [name], female    DOB: 1935-07-24, 81 y.o.   MRN: [id number]  HPI  81 year old who underwent mbar laminectomy by Dr. Ronnald Ramp on 05/03/16. Presented to [facility] 6/7 with fevers, weakness and diffuse pain. She developed sepsis/hypotension and was found to have MSSA bacteremia and transferred to [facility] 6/15 for continued care and assessment by EP team considering she has cardiac device. At [facility] she underwent evaluation for metastatic sites of infection including CT of spine, TTE/TEE and BCx monitoring. All repeat BCx were negative once transferred here. TTE/TEE were both negative for lead or valvular endocarditis at that time; received 4 weeks IV Ancef and decision to leave cardiac device.  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 She underwent single staged exchange arthroplasty on right knee by Dr. Delfino Lovett. Dr. Lovena Le was not available for device extraction here so she was transferred to [facility] where she had cardiac device extracted on August 10th. She had blood cultures and device cultures done with no growth. PICC inserted on 08/18/16. She was sent out on IV cefazolin 2g IV q 8 but also IV rifampin 300 mg IV q 12 at SNF and then home. Managing FIVE infusions was overwhelming for her husband. I changed her to rifampin '300mg'$  orally BID and then Ceftriaxone 2gram IV push to complete 42 days of therapy post extraction. Fortunately her cardiac function has improved quite nicely and she may not need reimplantation.   She still does have knee pain medially that continues more so with weight bearing.  She was not having labs done by home infusion company.  Past Medical History:  Diagnosis Date  . Acrodermatitis continua of [place]    "was in remission for 5 years the 1st  time; ~ 7 years the second time" (06/21/2016)  . Arthritis    "finger joints" (06/21/2016)  . Asthma   . CHF (congestive heart failure) ([place])   . Congestive dilated cardiomyopathy ([place]) 03/31/2014   a. s/p Biotronik CRTD 06/2014  . GERD (gastroesophageal reflux disease)   . History of blood transfusion 1990s   "related to hysterectomy"  . Hypothyroidism   . Left bundle branch block   . MSSA bacteremia 06/21/2016  . Myocardial infarction ([place])   . Pancreatitis, acute     Past Surgical History:  Procedure Laterality Date  . ABDOMINAL HYSTERECTOMY    . APPENDECTOMY    . CARDIAC CATHETERIZATION    . CARDIAC DEFIBRILLATOR PLACEMENT  ?2016  . CATARACT EXTRACTION W/ INTRAOCULAR LENS  IMPLANT, BILATERAL Bilateral   . CHOLECYSTECTOMY OPEN     "related to pancreatitis"  . DILATION AND CURETTAGE OF UTERUS    . EP IMPLANTABLE DEVICE N/A 07/04/2014   Procedure: BiV ICD Insertion CRT-D;  Surgeon: Deboraha Sprang, MD;  Location: [facility] CV LAB;  Service: Cardiovascular;  Laterality: N/A;  . HERNIA REPAIR    . I&D KNEE WITH POLY EXCHANGE Right 08/11/2016   Procedure: IRRIGATION AND DEBRIDEMENT KNEE WITH POLY EXCHANGE;  Surgeon: Rod Can, MD;  Location: [facility];  Service: Orthopedics;  Laterality: Right;  . INSERT / REPLACE / REMOVE PACEMAKER  ?2016  . JOINT REPLACEMENT Bilateral    "thumb joints; used my own material"  . JOINT REPLACEMENT    . LEFT HEART CATHETERIZATION WITH CORONARY ANGIOGRAM  N/A 11/02/2013   Procedure: LEFT HEART CATHETERIZATION WITH CORONARY ANGIOGRAM;  Surgeon: Corky Crafts, MD;  Location: [facility] CATH LAB;  Service: Cardiovascular;  Laterality: N/A;  . LOOP RECORDER IMPLANT N/A 04/13/2014   Procedure: LOOP RECORDER IMPLANT;  Surgeon: Duke Salvia, MD;  Location: [facility] CATH LAB;  Service: Cardiovascular;  Laterality: N/A;  . LOOP RECORDER REMOVAL  ?2016  . POSTERIOR LUMBAR FUSION  05/03/2016   Lumbar Two-Three, Lumbar Three-Four Posterior Lumbar Fusion withLaminectomy  and Foraminotomy  . SPINE SURGERY  04/2016   lumbar   . TEE WITHOUT CARDIOVERSION N/A 08/14/2016   Procedure: TRANSESOPHAGEAL ECHOCARDIOGRAM (TEE);  Surgeon: Lars Masson, MD;  Location: [facility] ENDOSCOPY;  Service: Cardiovascular;  Laterality: N/A;  . TONSILLECTOMY    . TOTAL HIP ARTHROPLASTY Bilateral   . UMBILICAL HERNIA REPAIR  1970s?    Family History  Problem Relation Age of Onset  . Hyperlipidemia Mother   . Arrhythmia Mother        palpitations  . Heart disease Father   . Heart attack Father   . Heart attack Maternal Grandfather   . Heart attack Paternal Grandfather       Social History   Social History  . Marital status: Married    Spouse name: N/A  . Number of children: N/A  . Years of education: N/A   Social History Main Topics  . Smoking status: Never Smoker  . Smokeless tobacco: Never Used     Comment: "smoked as a teenager; for maybe 1 wk"  . Alcohol use Yes     Comment: 6/15//2018 "glass of wine q 6 months or so"  . Drug use: No  . Sexual activity: Not Asked   Other Topics Concern  . None   Social History Narrative  . None    Allergies  Allergen Reactions  . Sulfa Antibiotics Hives     Current Outpatient Prescriptions:  .  albuterol (PROAIR HFA) 108 (90 Base) MCG/ACT inhaler, Inhale 2 puffs into the lungs every 6 (six) hours as needed for wheezing or shortness of breath., Disp: , Rfl:  .  alum & mag hydroxide-simeth (MAALOX/MYLANTA) 200-200-20 MG/5ML suspension, Take 30 mLs by mouth every 4 (four) hours as needed for indigestion., Disp: 355 mL, Rfl: 0 .  aspirin EC 81 MG tablet, Take 81 mg by mouth daily., Disp: , Rfl:  .  aspirin-acetaminophen-caffeine (EXCEDRIN MIGRAINE) 250-250-65 MG per tablet, Take 1-2 tablets by mouth every 6 (six) hours as needed for headache or migraine. , Disp: , Rfl:  .  augmented betamethasone dipropionate (DIPROLENE-AF) 0.05 % ointment, Apply 1 application topically daily as needed (acrodermatitis [place] on  fingers/toes). , Disp: , Rfl:  .  Calcium Carbonate Antacid (TUMS PO), Take 2 tablets by mouth daily as needed (indigestion)., Disp: , Rfl:  .  carvedilol (COREG) 12.5 MG tablet, Take 1 tablet (12.5 mg total) by mouth 2 (two) times daily., Disp: 180 tablet, Rfl: 3 .  cefTRIAXone (ROCEPHIN) 2 g SOLR injection, Inject 2 g into the vein daily., Disp: 13 each, Rfl: 0 .  docusate sodium (COLACE) 100 MG capsule, Take 1 capsule (100 mg total) by mouth 2 (two) times daily., Disp: 10 capsule, Rfl: 0 .  feeding supplement, ENSURE ENLIVE, (ENSURE ENLIVE) LIQD, Take 237 mLs by mouth 2 (two) times daily between meals., Disp: 237 mL, Rfl: 12 .  furosemide (LASIX) 20 MG tablet, TAKE 1 TABLET DAILY AS NEEDED FOR EDEMA, Disp: 90 tablet, Rfl: 2 .  levothyroxine (SYNTHROID, LEVOTHROID) 112  MCG tablet, Take 112 mcg by mouth daily before breakfast., Disp: , Rfl:  .  losartan (COZAAR) 50 MG tablet, Take 1 tablet (50 mg total) by mouth daily. (Patient taking differently: Take 25-50 mg by mouth See admin instructions. '25mg'$  daily Then take an additional '25mg'$  when BP elevated), Disp: 30 tablet, Rfl: 11 .  Melatonin 5 MG TABS, Take 15 mg by mouth at bedtime as needed (for sleep.). , Disp: , Rfl:  .  menthol-cetylpyridinium (CEPACOL) 3 MG lozenge, Take 1 lozenge (3 mg total) by mouth as needed for sore throat (sore throat)., Disp: 100 tablet, Rfl: 12 .  methocarbamol (ROBAXIN) 500 MG tablet, Take 1 tablet (500 mg total) by mouth every 6 (six) hours as needed for muscle spasms., Disp: 10 tablet, Rfl: 0 .  methylphenidate (RITALIN) 20 MG tablet, Take 20 mg by mouth 2 (two) times daily as needed (narcolepsy). , Disp: , Rfl:  .  metoCLOPramide (REGLAN) 5 MG tablet, Take 1-2 tablets (5-10 mg total) by mouth every 8 (eight) hours as needed for nausea (if ondansetron (ZOFRAN) ineffective.)., Disp: 10 tablet, Rfl: 0 .  Naphazoline-Pheniramine (ALLERGY EYE OP), Place 1-2 drops into both eyes daily as needed (for irritated eyes)., Disp: ,  Rfl:  .  ondansetron (ZOFRAN) 4 MG tablet, Take 1 tablet (4 mg total) by mouth every 6 (six) hours as needed for nausea., Disp: 20 tablet, Rfl: 0 .  polyethylene glycol (MIRALAX / GLYCOLAX) packet, Take 17 g by mouth daily as needed for mild constipation., Disp: 14 each, Rfl: 0 .  Probiotic Product (PROBIOTIC PO), Take 1 capsule by mouth at bedtime. , Disp: , Rfl:  .  rifampin (RIFADIN) 300 MG capsule, Take 1 capsule (300 mg total) by mouth every 12 (twelve) hours., Disp: 60 capsule, Rfl: 11 .  senna (SENOKOT) 8.6 MG TABS tablet, Take 2 tablets (17.2 mg total) by mouth at bedtime., Disp: 120 each, Rfl: 0 .  cephALEXin (KEFLEX) 500 MG capsule, Take 1 capsule (500 mg total) by mouth 4 (four) times daily., Disp: 120 capsule, Rfl: 11 .  diphenhydrAMINE (BENADRYL) 12.5 MG/5ML elixir, Take 5-10 mLs (12.5-25 mg total) by mouth every 4 (four) hours as needed for itching. (Patient not taking: Reported on 09/25/2016), Disp: 120 mL, Rfl: 0   Review of Systems  Constitutional: Negative for chills and fever.  HENT: Negative for congestion and sore throat.   Eyes: Negative for photophobia.  Respiratory: Negative for cough, shortness of breath and wheezing.   Cardiovascular: Negative for chest pain, palpitations and leg swelling.  Gastrointestinal: Negative for abdominal pain, blood in stool, constipation, diarrhea, nausea and vomiting.  Genitourinary: Negative for dysuria, flank pain and hematuria.  Musculoskeletal: Positive for neck pain. Negative for back pain and myalgias.  Skin: Negative for rash.  Neurological: Negative for dizziness, weakness and headaches.  Hematological: Does not bruise/bleed easily.  Psychiatric/Behavioral: Negative for suicidal ideas.       Objective:   Physical Exam  Constitutional: She is oriented to person, place, and time. She appears well-developed and well-nourished. No distress.  HENT:  Head: Normocephalic and atraumatic.  Mouth/Throat: No oropharyngeal exudate.    Eyes: Conjunctivae and EOM are normal. No scleral icterus.  Neck: Normal range of motion. Neck supple.  Cardiovascular: Normal rate and regular rhythm.   Murmur heard.  Systolic murmur is present  RUSB  Pulmonary/Chest: Effort normal. No respiratory distress. She has no wheezes.    Abdominal: She exhibits no distension.  Musculoskeletal: She exhibits no edema or tenderness.  Legs: Neurological: She is alert and oriented to person, place, and time. She exhibits normal muscle tone. Coordination normal.  Skin: Skin is warm and dry. No rash noted. She is not diaphoretic. No erythema. No pallor.  Psychiatric: She has a normal mood and affect. Her behavior is normal. Judgment and thought content normal.          Assessment & Plan:    ICD infection: sp extraction and she will have completed 42 days of systemic therapy post extraction.  Pull PICC after her last dose of her Ceftriaxone tomorrow, then go to keflex and rifampin (see below)  PJI sp single staged procedure; higher rate of failure with a 2 step. We will give her protracted oral therapy starting with keflex '500mg'$  QID and oral rifampin '300mg'$  BID  She does have another PJ on the left which would be at risk if she becomes bacteremic again and also her lumbar site   Check ESR, CRP, CMP, CBC today (labs were not being done with infusion co)  RTC to see me in 2 months  I spent greater than 40 minutes with the patient including greater than 50% of time in face to face counsel of the patient and her husband re her ICD infection, PJI, how antibiotics are to be given, risks and benefits to different abx regimens and  in coordination of her care with home infusion company.

## 2016-09-26 ENCOUNTER — Telehealth: Payer: Self-pay | Admitting: Licensed Clinical Social Worker

## 2016-09-26 LAB — CBC WITH DIFFERENTIAL/PLATELET
BASOS ABS: 80 {cells}/uL (ref 0–200)
Basophils Relative: 1.4 %
EOS ABS: 467 {cells}/uL (ref 15–500)
Eosinophils Relative: 8.2 %
HEMATOCRIT: 35.1 % (ref 35.0–45.0)
HEMOGLOBIN: 11.4 g/dL — AB (ref 11.7–15.5)
LYMPHS ABS: 1083 {cells}/uL (ref 850–3900)
MCH: 28.9 pg (ref 27.0–33.0)
MCHC: 32.5 g/dL (ref 32.0–36.0)
MCV: 89.1 fL (ref 80.0–100.0)
MPV: 11.4 fL (ref 7.5–12.5)
Monocytes Relative: 8.9 %
NEUTROS ABS: 3563 {cells}/uL (ref 1500–7800)
NEUTROS PCT: 62.5 %
Platelets: 226 10*3/uL (ref 140–400)
RBC: 3.94 10*6/uL (ref 3.80–5.10)
RDW: 13.6 % (ref 11.0–15.0)
Total Lymphocyte: 19 %
WBC: 5.7 10*3/uL (ref 3.8–10.8)
WBCMIX: 507 {cells}/uL (ref 200–950)

## 2016-09-26 LAB — BASIC METABOLIC PANEL WITH GFR
BUN: 24 mg/dL (ref 7–25)
CALCIUM: 9.1 mg/dL (ref 8.6–10.4)
CHLORIDE: 104 mmol/L (ref 98–110)
CO2: 22 mmol/L (ref 20–32)
Creat: 0.86 mg/dL (ref 0.60–0.93)
GFR, EST AFRICAN AMERICAN: 75 mL/min/{1.73_m2} (ref >=60)
GFR, EST NON AFRICAN AMERICAN: 65 mL/min/{1.73_m2} (ref >=60)
Glucose, Bld: 82 mg/dL (ref 65–99)
Potassium: 4.5 mmol/L (ref 3.5–5.3)
Sodium: 136 mmol/L (ref 135–146)

## 2016-09-26 LAB — HEPATIC FUNCTION PANEL
AG RATIO: 1.1 (calc) (ref 1.0–2.5)
ALKALINE PHOSPHATASE (APISO): 67 U/L (ref 33–130)
ALT: 7 U/L (ref 6–29)
AST: 15 U/L (ref 10–35)
Albumin: 4 g/dL (ref 3.6–5.1)
Bilirubin, Direct: 0.1 mg/dL (ref 0.0–0.2)
Globulin: 3.5 g/dL (calc) (ref 1.9–3.7)
Indirect Bilirubin: 0.3 mg/dL (calc) (ref 0.2–1.2)
TOTAL PROTEIN: 7.5 g/dL (ref 6.1–8.1)
Total Bilirubin: 0.4 mg/dL (ref 0.2–1.2)

## 2016-09-26 LAB — C-REACTIVE PROTEIN: CRP: 1.3 mg/L (ref <8.0)

## 2016-09-26 LAB — SEDIMENTATION RATE: SED RATE: 36 mm/h — AB (ref 0–30)

## 2016-09-26 NOTE — Telephone Encounter (Addendum)
Patient called wondering when [facility] would come out and pull her PICC line out, I called and they said they would call the patient and schedule an appointment for tomorrow or the weekend. The order was faxed yesterday and it had not scheduled yet. I expressed that the  patient would not be available tomorrow, and was hoping for today. They were going to call her to set an appointment up.  I called the patient to let her know this information and she could be available before 11am tomorrow morning.  Talked to Lorie with performance improvement with [facility] and the nurse manager is going to the patient's home today to pull her PICC.

## 2016-10-07 ENCOUNTER — Encounter: Payer: Self-pay | Admitting: Cardiology

## 2016-10-07 ENCOUNTER — Encounter (INDEPENDENT_AMBULATORY_CARE_PROVIDER_SITE_OTHER): Payer: Self-pay

## 2016-10-07 ENCOUNTER — Ambulatory Visit (INDEPENDENT_AMBULATORY_CARE_PROVIDER_SITE_OTHER): Payer: Medicare Other | Admitting: Cardiology

## 2016-10-07 ENCOUNTER — Ambulatory Visit (HOSPITAL_COMMUNITY): Payer: Medicare Other | Attending: Internal Medicine

## 2016-10-07 VITALS — BP 128/68 | HR 71 | Ht <= 58 in | Wt 150.0 lb

## 2016-10-07 DIAGNOSIS — I428 Other cardiomyopathies: Secondary | ICD-10-CM | POA: Insufficient documentation

## 2016-10-07 DIAGNOSIS — I080 Rheumatic disorders of both mitral and aortic valves: Secondary | ICD-10-CM | POA: Insufficient documentation

## 2016-10-07 DIAGNOSIS — Z95810 Presence of automatic (implantable) cardiac defibrillator: Secondary | ICD-10-CM

## 2016-10-07 DIAGNOSIS — B9561 Methicillin susceptible Staphylococcus aureus infection as the cause of diseases classified elsewhere: Secondary | ICD-10-CM

## 2016-10-07 DIAGNOSIS — I119 Hypertensive heart disease without heart failure: Secondary | ICD-10-CM | POA: Diagnosis not present

## 2016-10-07 DIAGNOSIS — R7881 Bacteremia: Secondary | ICD-10-CM

## 2016-10-07 DIAGNOSIS — I447 Left bundle-branch block, unspecified: Secondary | ICD-10-CM | POA: Diagnosis not present

## 2016-10-07 DIAGNOSIS — I10 Essential (primary) hypertension: Secondary | ICD-10-CM | POA: Diagnosis not present

## 2016-10-07 NOTE — Progress Notes (Signed)
Cardiology Office Note Date:  10/07/2016  Patient ID:  [name] [name], [name] [name] [birth year], MRN [id number] PCP:  [name], MD  Cardiologist:  Dr. Delton See Electrophysiologist: Dr. Graciela Husbands   Chief Complaint: Two-week follow-up, worsening fatigue shortness of breath.  History of Present Illness: [name] [name] is a 81 y.o. female with history of NICM w/hx of CRT-D though extracted 2/2 recurrent MSSA bacteremia,  asthma, LBBB, chronic CHF (systolic), hypothyroidism.  She was admitted to [facility] initially 6/7-18 with MSSA bacteremia of uncertain etiology, during this time severe sepsis and had 2/2 blood cultures positive on 6/7.  She had an LP and a CT chest that suggested RML and lingular infiltrates.  She was treated at various times with Vanc, Rocephin, Levaquin, and Doxy and she was eventually changed to Ancef per ID recommendations and completed 4 weeks of therapy.  TTE and TEE were negative.  She "felt great" upon completing her antibiotics on 7/5.    She had a bilateral TKR 14 years ago and on Thursday (8/2) noticed R knee stiffness/pain.  On Friday (8/3) it was swollen and hot, presented to [facility] 08/11/16 with acutely increased swelling and pain, in the ER Grossly purulent fluid on right knee arthrocentesis, admitted with septic arthritis and underwent I&D of R knee with ply exchange.  TEE this admission was + for vegetation on RA lead, and transferred to [facility] for device extraction (given Dr. Ladona Ridgel was away).  She underwent extraction of her device by Dr. Gary Fleet on 08/16/16.  08/29/2016  - She comes today accompanied by her husband.  She is doing pretty well, ambulating with a walker, remains with PICC line and saw ID earlier this week, reports being told another 4 weeks of IV antibiotics and likely will need lifelong oral antibiotics afterwards.  She denies any kind of CP, no palpitations or SOB, no dizziness, near syncope or syncope.  She did not want to have follow up for wound care/check  at Dr. Shon Baton, wanted to come back here.  She feels like she is making slow steady progress, no fever or symptoms of illness.  09/24/2016 - 3 weeks follow-up, the patient feels and looks significantly better. She finally Harris color in her cheeks and her energy level has improved significantly. She denies any chest pain or shortness of breath, she has persistent swelling and pain in her right knee that was operated on on 08/11/2016. Her right lower extremity has mild lower extremity edema but not the left lower extremity. She has no orthopnea or personal nocturnal dyspnea no dizziness palpitation or syncope. She has mild tenderness at night in her left upper chest wall where her biventricular ICD was explanted. She continues to use daily IV antibiotics, currently completing 6 weeks course and is following up with ID tomorrow. She might require lifelong antibiotics, hopefully PO.  10/07/2016 - the patient is coming after 2 weeks, she continues with the antibiotics that were now switch to by mouth, she is using Keflex and rifampin. She is to follow with ID in 2 weeks. Since the last visit she has not based progressive worsening shortness of breath and lack of energy, she is able to do activities of daily living such as cooking but gets profoundly tired and has to take several breaks. She denies any lower extremity edema orthopnea or proximal nocturnal dyspnea. She feels PVCs no longer palpitations dizziness or falls. She walks with a walker.  DEVICE information: Biotronik CRT-D, implanted 07/04/14, Dr. Graciela Husbands EXTRACTED 2/2 MSSA bacteremia 08/16/16,  Dr. Gary Fleet at [facility]  Past Medical History:  Diagnosis Date  . 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 Arthritis    "finger joints" (06/21/2016)  . Asthma   . CHF (congestive heart failure) (HCC)   . Congestive dilated cardiomyopathy (HCC) 03/31/2014   a. s/p  Biotronik CRTD 06/2014  . GERD (gastroesophageal reflux disease)   . History of blood transfusion 1990s   "related to hysterectomy"  . Hypothyroidism   . Left bundle branch block   . MSSA bacteremia 06/21/2016  . Myocardial infarction (HCC)   . Pancreatitis, acute     Past Surgical History:  Procedure Laterality Date  . ABDOMINAL HYSTERECTOMY    . APPENDECTOMY    . CARDIAC CATHETERIZATION    . CARDIAC DEFIBRILLATOR PLACEMENT  ?2016  . CATARACT EXTRACTION W/ INTRAOCULAR LENS  IMPLANT, BILATERAL Bilateral   . CHOLECYSTECTOMY OPEN     "related to pancreatitis"  . DILATION AND CURETTAGE OF UTERUS    . EP IMPLANTABLE DEVICE N/A 07/04/2014   Procedure: BiV ICD Insertion CRT-D;  Surgeon: Duke Salvia, MD;  Location: [facility] INVASIVE CV LAB;  Service: Cardiovascular;  Laterality: N/A;  . HERNIA REPAIR    . I&D KNEE WITH POLY EXCHANGE Right 08/11/2016   Procedure: IRRIGATION AND DEBRIDEMENT KNEE WITH POLY EXCHANGE;  Surgeon: Samson Frederic, MD;  Location: [facility] OR;  Service: Orthopedics;  Laterality: Right;  . INSERT / REPLACE / REMOVE PACEMAKER  ?2016  . JOINT REPLACEMENT Bilateral    "thumb joints; used my own material"  . JOINT REPLACEMENT    . LEFT HEART CATHETERIZATION WITH CORONARY ANGIOGRAM N/A 11/02/2013   Procedure: LEFT HEART CATHETERIZATION WITH CORONARY ANGIOGRAM;  Surgeon: Corky Crafts, MD;  Location: [facility] CATH LAB;  Service: Cardiovascular;  Laterality: N/A;  . LOOP RECORDER IMPLANT N/A 04/13/2014   Procedure: LOOP RECORDER IMPLANT;  Surgeon: Duke Salvia, MD;  Location: [facility] CATH LAB;  Service: Cardiovascular;  Laterality: N/A;  . LOOP RECORDER REMOVAL  ?2016  . POSTERIOR LUMBAR FUSION  05/03/2016   Lumbar Two-Three, Lumbar Three-Four Posterior Lumbar Fusion withLaminectomy and Foraminotomy  . SPINE SURGERY  04/2016   lumbar   . TEE WITHOUT CARDIOVERSION N/A 08/14/2016   Procedure: TRANSESOPHAGEAL ECHOCARDIOGRAM (TEE);  Surgeon: Lars Masson, MD;  Location: [facility] ENDOSCOPY;   Service: Cardiovascular;  Laterality: N/A;  . TONSILLECTOMY    . TOTAL HIP ARTHROPLASTY Bilateral   . UMBILICAL HERNIA REPAIR  1970s?    Current Outpatient Prescriptions  Medication Sig Dispense Refill  . albuterol (PROAIR HFA) 108 (90 Base) MCG/ACT inhaler Inhale 2 puffs into the lungs every 6 (six) hours as needed for wheezing or shortness of breath.    [name] alum & mag hydroxide-simeth (MAALOX/MYLANTA) 200-200-20 MG/5ML suspension Take 30 mLs by mouth every 4 (four) hours as needed for indigestion. 355 mL 0  . aspirin EC 81 MG tablet Take 81 mg by mouth daily.    [name] aspirin-acetaminophen-caffeine (EXCEDRIN MIGRAINE) 250-250-65 MG per tablet Take 1-2 tablets by mouth every 6 (six) hours as needed for headache or migraine.     [name] augmented betamethasone dipropionate (DIPROLENE-AF) 0.05 % ointment Apply 1 application topically daily as needed (acrodermatitis [place] on fingers/toes).     . Calcium Carbonate Antacid (TUMS PO) Take 2 tablets by mouth daily as needed (indigestion).    . carvedilol (COREG) 12.5 MG tablet Take 1 tablet (12.5 mg total)  by mouth 2 (two) times daily. 180 tablet 3  . cefTRIAXone (ROCEPHIN) 2 g SOLR injection Inject 2 g into the vein daily. 13 each 0  . cephALEXin (KEFLEX) 500 MG capsule Take 1 capsule (500 mg total) by mouth 4 (four) times daily. 120 capsule 11  . diphenhydrAMINE (BENADRYL) 12.5 MG/5ML elixir Take 5-10 mLs (12.5-25 mg total) by mouth every 4 (four) hours as needed for itching. 120 mL 0  . docusate sodium (COLACE) 100 MG capsule Take 1 capsule (100 mg total) by mouth 2 (two) times daily. 10 capsule 0  . feeding supplement, ENSURE ENLIVE, (ENSURE ENLIVE) LIQD Take 237 mLs by mouth 2 (two) times daily between meals. 237 mL 12  . furosemide (LASIX) 20 MG tablet TAKE 1 TABLET DAILY AS NEEDED FOR EDEMA 90 tablet 2  . levothyroxine (SYNTHROID, LEVOTHROID) 112 MCG tablet Take 112 mcg by mouth daily before breakfast.    . losartan (COZAAR) 50 MG tablet Take 1 tablet  (50 mg total) by mouth daily. (Patient taking differently: Take 25-50 mg by mouth See admin instructions.  daily Then take an additional  when BP elevated) 30 tablet 11  . Melatonin 5 MG TABS Take 15 mg by mouth at bedtime as needed (for sleep.).     [name] menthol-cetylpyridinium (CEPACOL) 3 MG lozenge Take 1 lozenge (3 mg total) by mouth as needed for sore throat (sore throat). 100 tablet 12  . methocarbamol (ROBAXIN) 500 MG tablet Take 1 tablet (500 mg total) by mouth every 6 (six) hours as needed for muscle spasms. 10 tablet 0  . methylphenidate (RITALIN) 20 MG tablet Take 20 mg by mouth 2 (two) times daily as needed (narcolepsy).     [name] metoCLOPramide (REGLAN) 5 MG tablet Take 1-2 tablets (5-10 mg total) by mouth every 8 (eight) hours as needed for nausea (if ondansetron (ZOFRAN) ineffective.). 10 tablet 0  . Naphazoline-Pheniramine (ALLERGY EYE OP) Place 1-2 drops into both eyes daily as needed (for irritated eyes).    . ondansetron (ZOFRAN) 4 MG tablet Take 1 tablet (4 mg total) by mouth every 6 (six) hours as needed for nausea. 20 tablet 0  . polyethylene glycol (MIRALAX / GLYCOLAX) packet Take 17 g by mouth daily as needed for mild constipation. 14 each 0  . Probiotic Product (PROBIOTIC PO) Take 1 capsule by mouth at bedtime.     . rifampin (RIFADIN) 300 MG capsule Take 1 capsule (300 mg total) by mouth every 12 (twelve) hours. 60 capsule 11  . senna (SENOKOT) 8.6 MG TABS tablet Take 2 tablets (17.2 mg total) by mouth at bedtime. 120 each 0   No current facility-administered medications for this visit.     Allergies:   Sulfa antibiotics   Social History:  The patient  reports that she has never smoked. She has never used smokeless tobacco. She reports that she drinks alcohol. She reports that she does not use drugs.   Family History:  The patient's family history includes Arrhythmia in her mother; Heart attack in her father, maternal grandfather, and paternal grandfather; Heart  disease in her father; Hyperlipidemia in her mother.  ROS:  Please see the history of present illness.  All other systems are reviewed and otherwise negative.   PHYSICAL EXAM:  VS:  BP 128/68   Pulse 71   Ht  (1.473 m)   Wt 150 lb (68 kg)   LMP  (LMP Unknown)   SpO2 99%   BMI 31.35 kg/m  BMI: Body mass  index is 31.35 kg/m. Well nourished, well developed, in no acute distress  HEENT: normocephalic, atraumatic  Neck: no JVD, carotid bruits or masses Cardiac: RRR ; no significant murmurs, no rubs, or gallops Lungs: CTA b/l, no wheezing, rhonchi or rales , eschar in the left upper thoracic low post CRT-D explantation with no increase warmth or erythema, mild tenderness no discharge. Abd: soft, nontender MS: no deformity or atrophy Ext: trace-1+no edema in the right lower extremity, increased warmth around the knee joint with persistent mild swelling. Right upper extremity has a PICC line that doesn't seem to be infected. Skin: warm and dry, no rash Neuro:  No gross deficits appreciated Psych: euthymic mood, full affect  ICD extraction site: dressing/steri strips were removed well healed.  No erythema, edema, no increased heat to the tissues, no fluid collection or tenderness.   EKG:  Done today and reviewed by myself is SR, LBBB, QRS  08/14/16: TEE Study Conclusions - Left ventricle: Systolic function was normal. The estimated   ejection fraction was in the range of 55% to 60%. Wall motion was   normal; there were no regional wall motion abnormalities. - Aortic valve: No evidence of vegetation. There was mild   regurgitation. - Aorta: There was mild non-mobile atheroma. - Mitral valve: There was mild regurgitation. - Left atrium: No evidence of thrombus in the atrial cavity or   appendage. No evidence of thrombus in the atrial cavity or   appendage. - Right atrium: No evidence of thrombus in the atrial cavity or   appendage. No evidence of thrombus in the atrial  cavity or   appendage. - Tricuspid valve: No evidence of vegetation. There was mild   regurgitation. - Pulmonic valve: No evidence of vegetation. Impressions: - There is a highly mobile echodensity measuring 13 x 3 mm attached   to the right atrial lead in the right atrium at the tricuspid   valve level. This is consistent with pacemaker endocarditis.   10/17/15: limited echo for AV optimization Impressions: - The study is performed for AV optimization   There is grade 1 diastolic dysfunction   The optimal AV delay seems to be around 90-100 ms but i would   defer to the doctors managing the pacer.  02/01/15: TTE Study Conclusions - Left ventricle: The cavity size was normal. There was moderate   concentric hypertrophy. Systolic function was normal. The   estimated ejection fraction was in the range of 50% to 55%. Wall   motion was normal; there were no regional wall motion   abnormalities. Doppler parameters are consistent with abnormal   left ventricular relaxation (grade 1 diastolic dysfunction). The   E/e&' ratio is >15, suggesting elevated LV filling pressure. - Mitral valve: Calcified annulus. Mildly thickened leaflets .   There was trivial regurgitation. - Left atrium: The atrium was normal in size. - Right ventricle: The cavity size was normal. Wall thickness was   normal. Pacer wire or catheter noted in right ventricle. Systolic   function was normal. - Right atrium: The atrium was normal in size. Pacer wire or   catheter noted in right atrium. - Tricuspid valve: There was moderate regurgitation. - Pulmonary arteries: PA peak pressure: 30 mm Hg (S). - Inferior vena cava: The vessel was normal in size. The   respirophasic diameter changes were in the normal range (= 50%),   consistent with normal central venous pressure. Impressions: - Compared to a prior study in 09/2014, the EF has improved to  50-55%.  Recent Labs: 12/07/2015: Brain Natriuretic Peptide  33.0 06/22/2016: Magnesium 1.9 08/12/2016: TSH 0.995 09/25/2016: ALT 7; BUN 24; Creat 0.86; Hemoglobin 11.4; Platelets 226; Potassium 4.5; Sodium 136  12/07/2015: Cholesterol 231; HDL 61; LDL Cholesterol 144; Total CHOL/HDL Ratio 3.8; Triglycerides 131; VLDL 26   Estimated Creatinine Clearance: 44 mL/min (by C-G formula based on SCr of 0.86 mg/dL).   Wt Readings from Last 3 Encounters:  10/07/16 150 lb (68 kg)  09/25/16 151 lb (68.5 kg)  09/24/16 150 lb (68 kg)     Other studies reviewed: Additional studies/records reviewed today include: summarized above  EKG performed today 10/07/2016 shows normal sinus rhythm with left axis deviation and left bundle branch block QRS flanks 168 ms that is prolonged from prior day was 156 ms. This was personally reviewed.   ASSESSMENT AND PLAN:  1. NICM with interval improvement of her EF 35-40% -->50-55%, however CRT-D endocarditis - s/p CRT-D explantation on 08/16/16 --> repeat ECG prolongation of QRS from 122 ms while on by the pacing, post 6 mentation patient back and left bundle branch block with QRS of 156 ms at the last visit and 168 ms on today's EKG.  - echocardiogram today shows worsening LVEF now 40%, personally reviewed. - Worsening symptoms of fatigue and shortness of breath back to pre-CRT-D time. - She is not fluid overloaded we'll continue the same management - I will discuss potential reimplantation of the device with Dr. Graciela Husbands  2. CRT-D - s/p device extraction with recurrent MSSA bacteremia and vegetation - No plans for re-implant at this time (Dr. Graciela Husbands discussed this with the patient's husband via telephone last week), however worsening symptoms and worsening LVEF I will discuss with Dr. Graciela Husbands. - On antibiotics long-term potentially life long.  3. HTN - Controlled.  Disposition: Follow up on 10/07/2016.  Current medicines are reviewed at length with the patient today.  The patient did not have any concerns regarding  medicines.  Signed, Tobias Alexander, MD 10/07/2016 11:06 AM

## 2016-10-07 NOTE — Patient Instructions (Addendum)
Medication Instructions:   Your physician recommends that you continue on your current medications as directed. Please refer to the Current Medication list given to you today.     Follow-Up:  WITH DR. Delton See ON 11/07/16 AT 11:40 AM.         If you need a refill on your cardiac medications before your next appointment, please call your pharmacy.

## 2016-10-10 ENCOUNTER — Telehealth: Payer: Self-pay | Admitting: Cardiology

## 2016-10-10 NOTE — Telephone Encounter (Signed)
Patient is calling to see if she needed to see Dr. Daiva Eves prior to seeing Dr. Delton See d/t being on ABT for septicemia. Please advise.

## 2016-10-10 NOTE — Telephone Encounter (Signed)
New Message     Patient has questions about coordinating with Dr Daiva Eves about her procedure would like to speak with Ivy when she is in office

## 2016-10-16 NOTE — Telephone Encounter (Signed)
Yes, I agree that she  Should see ID (Dr Zenaida Niece dam) doctor before she sees [place]. Dr Graciela Husbands is on vacation so I wasn't able to discuss her case and possible re-implantation of a pacemaker with him yet, he will be back the next week, we will call her then.

## 2016-10-24 NOTE — Telephone Encounter (Signed)
Spoke with the [name] [name] and informed her that Dr Graciela Husbands would like to see her on 10/26 at 3:30 pm.  [name] [name] agreed to appt date and time with Dr Graciela Husbands. [name] [name] gracious for all the assistance provided.

## 2016-10-24 NOTE — Telephone Encounter (Signed)
Dr. Delton See, did you want to talk with Dr Graciela Husbands about this pt, prior to making adjustments to her scheduled appt with you on 11/1?

## 2016-10-24 NOTE — Telephone Encounter (Signed)
Ivy, Please let her know that Dr Graciela Husbands has her scheduled for Friday 10/26 at 3:30 pm. Thank you, KN

## 2016-10-24 NOTE — Telephone Encounter (Signed)
Follow up    Pt is calling back to speak with [facility]

## 2016-10-24 NOTE — Telephone Encounter (Signed)
Left a message for both the pt and wife to call back to endorse scheduled appt with Dr Graciela Husbands on 10/26 at 3:30 pm.

## 2016-11-01 ENCOUNTER — Ambulatory Visit (INDEPENDENT_AMBULATORY_CARE_PROVIDER_SITE_OTHER): Payer: Medicare Other | Admitting: Internal Medicine

## 2016-11-01 VITALS — BP 150/88 | HR 83 | Ht <= 58 in | Wt 151.0 lb

## 2016-11-01 DIAGNOSIS — M00061 Staphylococcal arthritis, right knee: Secondary | ICD-10-CM | POA: Diagnosis not present

## 2016-11-01 DIAGNOSIS — I447 Left bundle-branch block, unspecified: Secondary | ICD-10-CM

## 2016-11-01 DIAGNOSIS — I428 Other cardiomyopathies: Secondary | ICD-10-CM | POA: Diagnosis not present

## 2016-11-01 DIAGNOSIS — I5022 Chronic systolic (congestive) heart failure: Secondary | ICD-10-CM

## 2016-11-01 NOTE — Patient Instructions (Addendum)
Medication Instructions:  Your physician recommends that you continue on your current medications as directed. Please refer to the Current Medication list given to you today.  Labwork: None ordered  Testing/Procedures: None ordered  Follow-Up: No follow up is needed at this time with Dr. Klein.  He will see you on an as needed basis.   If you need a refill on your cardiac medications before your next appointment, please call your pharmacy.  Thank you for choosing [facility]!!         

## 2016-11-01 NOTE — Progress Notes (Signed)
Patient Care Team: Kaleen MaskElkins, Wilson Oliver, MD as PCP - General (Family Medicine)   HPI  [name] [name] is a 81 y.o. female Seen in follow-up for nonischemic cardiomyopathy left bundle branch block and nonsustained ventricular tachycardia.   She underwent CRT Biotronik implantation  6/16.  6/18  hospitalized and found to have staph bacteremia of (MSSA) in the wake of orthopedic surgery  [facility] was reportedly negative. She was treated with  6 weeks of antibiotics  She developed recurrent 'septic arthritis" and bacteremia and TEE>>Vegetation on RA lead.  Transferred to [facility] for extraction which she tolerated well   Rx with 8 weeks of IV Abx, most recently 10/18 keflex and rifampin; these are anticipated to last at least 6 months  She initially improved significantly following extreaction, but more recently has been struggling with fatigue some SOB            DATE TEST    6/16 Echo   EF 25 %   1/17 Echo   EF 55-60 %   10/18 Echo EF 30%  Mild AS Mod MR      7/18  Hgb 9.9  Ferritin 106  Saturation 23    Past Medical History:  Diagnosis Date  . 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 Arthritis    "finger joints" (06/21/2016)  . Asthma   . CHF (congestive heart failure) (HCC)   . Congestive dilated cardiomyopathy (HCC) 03/31/2014   a. s/p Biotronik CRTD 06/2014  . GERD (gastroesophageal reflux disease)   . History of blood transfusion 1990s   "related to hysterectomy"  . Hypothyroidism   . Left bundle branch block   . MSSA bacteremia 06/21/2016  . Myocardial infarction (HCC)   . Pancreatitis, acute     Past Surgical History:  Procedure Laterality Date  . ABDOMINAL HYSTERECTOMY    . APPENDECTOMY    . CARDIAC CATHETERIZATION    . CARDIAC DEFIBRILLATOR PLACEMENT  ?2016  . CATARACT EXTRACTION W/ INTRAOCULAR LENS  IMPLANT, BILATERAL Bilateral   . CHOLECYSTECTOMY  OPEN     "related to pancreatitis"  . DILATION AND CURETTAGE OF UTERUS    . EP IMPLANTABLE DEVICE N/A 07/04/2014   Procedure: BiV ICD Insertion CRT-D;  Surgeon: Duke SalviaSteven C Klein, MD;  [facility];  Service: Cardiovascular;  Laterality: N/A;  . HERNIA REPAIR    . I&D KNEE WITH POLY EXCHANGE Right 08/11/2016   Procedure: IRRIGATION AND DEBRIDEMENT KNEE WITH POLY EXCHANGE;  Surgeon: Samson FredericSwinteck, Brian, MD;  Location: [facility] OR;  Service: Orthopedics;  Laterality: Right;  . INSERT / REPLACE / REMOVE PACEMAKER  ?2016  . JOINT REPLACEMENT Bilateral    "thumb joints; used my own material"  . JOINT REPLACEMENT    . LEFT HEART CATHETERIZATION WITH CORONARY ANGIOGRAM N/A 11/02/2013   Procedure: LEFT HEART CATHETERIZATION WITH CORONARY ANGIOGRAM;  Surgeon: Corky CraftsJayadeep S Varanasi, MD;  [facility];  Service: Cardiovascular;  Laterality: N/A;  . LOOP RECORDER IMPLANT N/A 04/13/2014   Procedure: LOOP RECORDER IMPLANT;  Surgeon: Duke SalviaSteven C Klein, MD;  [facility];  Service: Cardiovascular;  Laterality: N/A;  . LOOP RECORDER REMOVAL  ?2016  . POSTERIOR LUMBAR FUSION  05/03/2016   Lumbar Two-Three, Lumbar Three-Four Posterior Lumbar Fusion withLaminectomy and Foraminotomy  . SPINE SURGERY  04/2016   lumbar   . TEE WITHOUT CARDIOVERSION  N/A 08/14/2016   Procedure: TRANSESOPHAGEAL ECHOCARDIOGRAM (TEE);  Surgeon: Lars Masson, MD;  Location: [facility] ENDOSCOPY;  Service: Cardiovascular;  Laterality: N/A;  . TONSILLECTOMY    . TOTAL HIP ARTHROPLASTY Bilateral   . UMBILICAL HERNIA REPAIR  1970s?    Current Outpatient Prescriptions  Medication Sig Dispense Refill  . albuterol (PROAIR HFA) 108 (90 Base) MCG/ACT inhaler Inhale 2 puffs into the lungs every 6 (six) hours as needed for wheezing or shortness of breath.    [name] alum & mag hydroxide-simeth (MAALOX/MYLANTA) 200-200-20 MG/5ML suspension Take 30 mLs by mouth every 4 (four) hours as needed for indigestion. 355 mL 0  . aspirin EC 81 MG  tablet Take 81 mg by mouth daily.    [name] aspirin-acetaminophen-caffeine (EXCEDRIN MIGRAINE) 250-250-65 MG per tablet Take 1-2 tablets by mouth every 6 (six) hours as needed for headache or migraine.     [name] augmented betamethasone dipropionate (DIPROLENE-AF) 0.05 % ointment Apply 1 application topically daily as needed (acrodermatitis [place] on fingers/toes).     . Calcium Carbonate Antacid (TUMS PO) Take 2 tablets by mouth daily as needed (indigestion).    . carvedilol (COREG) 12.5 MG tablet Take 1 tablet (12.5 mg total) by mouth 2 (two) times daily. 180 tablet 3  . cephALEXin (KEFLEX) 500 MG capsule Take 1 capsule (500 mg total) by mouth 4 (four) times daily. 120 capsule 11  . diphenhydrAMINE (BENADRYL) 12.5 MG/5ML elixir Take 5-10 mLs (12.5-25 mg total) by mouth every 4 (four) hours as needed for itching. 120 mL 0  . docusate sodium (COLACE) 100 MG capsule Take 1 capsule (100 mg total) by mouth 2 (two) times daily. 10 capsule 0  . feeding supplement, ENSURE ENLIVE, (ENSURE ENLIVE) LIQD Take 237 mLs by mouth 2 (two) times daily between meals. 237 mL 12  . furosemide (LASIX) 20 MG tablet TAKE 1 TABLET DAILY AS NEEDED FOR EDEMA 90 tablet 2  . levothyroxine (SYNTHROID, LEVOTHROID) 112 MCG tablet Take 112 mcg by mouth daily before breakfast.    . Melatonin 5 MG TABS Take 15 mg by mouth at bedtime as needed (for sleep.).     [name] menthol-cetylpyridinium (CEPACOL) 3 MG lozenge Take 1 lozenge (3 mg total) by mouth as needed for sore throat (sore throat). 100 tablet 12  . methocarbamol (ROBAXIN) 500 MG tablet Take 1 tablet (500 mg total) by mouth every 6 (six) hours as needed for muscle spasms. 10 tablet 0  . methylphenidate (RITALIN) 20 MG tablet Take 20 mg by mouth 2 (two) times daily as needed (narcolepsy).     [name] metoCLOPramide (REGLAN) 5 MG tablet Take 1-2 tablets (5-10 mg total) by mouth every 8 (eight) hours as needed for nausea (if ondansetron (ZOFRAN) ineffective.). 10 tablet 0  .  Naphazoline-Pheniramine (ALLERGY EYE OP) Place 1-2 drops into both eyes daily as needed (for irritated eyes).    . ondansetron (ZOFRAN) 4 MG tablet Take 1 tablet (4 mg total) by mouth every 6 (six) hours as needed for nausea. 20 tablet 0  . polyethylene glycol (MIRALAX / GLYCOLAX) packet Take 17 g by mouth daily as needed for mild constipation. 14 each 0  . Probiotic Product (PROBIOTIC PO) Take 1 capsule by mouth at bedtime.     . rifampin (RIFADIN) 300 MG capsule Take 1 capsule (300 mg total) by mouth every 12 (twelve) hours. 60 capsule 11  . senna (SENOKOT) 8.6 MG TABS tablet Take 2 tablets (17.2 mg total) by mouth at bedtime. 120 each 0  .  losartan (COZAAR) 50 MG tablet Take 1 tablet (50 mg total) by mouth daily. (Patient taking differently: Take 25-50 mg by mouth See admin instructions. 25mg  daily Then take an additional 25mg  when BP elevated) 30 tablet 11   No current facility-administered medications for this visit.     Allergies  Allergen Reactions  . Sulfa Antibiotics Hives    Review of Systems negative except from HPI and PMH  Physical Exam BP (!) 150/88   Pulse 83   Ht 4\' 10"  (1.473 m)   Wt 151 lb (68.5 kg)   LMP  (LMP Unknown)   SpO2 98%   BMI 31.56 kg/m  Well developed and nourished in no acute distress HENT normal Neck supple with JVP-flat Carotids brisk and full without bruits Clear Regular rate and rhythm, 2/6 m Abd-soft with active BS without hepatomegaly No Clubbing cyanosis edema Skin-warm and dry A & Oriented  Grossly normal sensory and motor function   0 10/18 ECG Personally reviewed    Sinus @ 78 17/17/47    Assessment and  Plan  Nonischemic cardiomyopathy    Left bundle branch block  CRT-D  Biotronik s/p extraction  Congestive heart failure class III  Hypertension  Septic Arthritis--chronic antibiotic suppression     The patient has worsening cardiomyopathy following device extraction. This unfortunately occurred in the context of  recurrent bacteremia and septic arthritis for which she is now on chronic suppressive antibiotics. I have spoken with ID as well as Dr. Delton See today. Medical therapy options remain for her cardiomyopathy and in the context of unknown risks of recurrent infection and bacteremia it seems like this would be the preferred route.  Dr. Algis Liming from ID will be looking into recurrent infection risks. We then may have to try to extrapolate risk of recurrent bacteremia so as to best be able to advise best as to her risks of recurrent device implantation  Hence, we will begin her on Aldactone. She will need a metabolic profile next week when she follows up with Dr. Verlin Fester. At that juncture, she might also be a candidate for the initiation of Entresto.  More than 50% of 40 min was spent in counseling related to the above  .

## 2016-11-07 ENCOUNTER — Other Ambulatory Visit: Payer: Self-pay | Admitting: Infectious Disease

## 2016-11-07 ENCOUNTER — Ambulatory Visit (INDEPENDENT_AMBULATORY_CARE_PROVIDER_SITE_OTHER): Payer: Medicare Other | Admitting: Cardiology

## 2016-11-07 ENCOUNTER — Encounter: Payer: Self-pay | Admitting: Cardiology

## 2016-11-07 ENCOUNTER — Encounter: Payer: Self-pay | Admitting: *Deleted

## 2016-11-07 VITALS — BP 124/64 | HR 72 | Ht <= 58 in | Wt 150.0 lb

## 2016-11-07 DIAGNOSIS — I447 Left bundle-branch block, unspecified: Secondary | ICD-10-CM

## 2016-11-07 DIAGNOSIS — M00061 Staphylococcal arthritis, right knee: Secondary | ICD-10-CM | POA: Diagnosis not present

## 2016-11-07 DIAGNOSIS — I428 Other cardiomyopathies: Secondary | ICD-10-CM

## 2016-11-07 DIAGNOSIS — I5022 Chronic systolic (congestive) heart failure: Secondary | ICD-10-CM | POA: Diagnosis not present

## 2016-11-07 MED ORDER — SACUBITRIL-VALSARTAN 24-26 MG PO TABS: 1.0000 | ORAL_TABLET | Freq: Two times a day (BID) | ORAL | 1 refills | Status: DC

## 2016-11-07 NOTE — Patient Instructions (Addendum)
Medication Instructions:   STOP TAKING LOSARTAN NOW  START TAKING ENTRESTO 24/26 MG BY MOUTH TWICE DAILY STARTING TOMORROW 11/08/16     Follow-Up:  ON 11/21/16 WITH DR NELSON--PLEASE ADD THIS PATIENT INTO THIS CLINIC DAY PER DR NELSON      If you need a refill on your cardiac medications before your next appointment, please call your pharmacy.

## 2016-11-07 NOTE — Progress Notes (Signed)
Cardiology Office Note Date:  11/07/2016  Patient ID:  [name] [name], [name] [name] [birth year], MRN [id number] PCP:  [name], MD  Cardiologist:  Dr. Delton See Electrophysiologist: Dr. Graciela Husbands   Chief Complaint: Two-week follow-up, worsening fatigue shortness of breath.  History of Present Illness: [name] [name] is a 80 y.o. female with history of NICM w/hx of CRT-D though extracted 2/2 recurrent MSSA bacteremia,  asthma, LBBB, chronic CHF (systolic), hypothyroidism.  She was admitted to [facility] initially 6/7-18 with MSSA bacteremia of uncertain etiology, during this time severe sepsis and had 2/2 blood cultures positive on 6/7.  She had an LP and a CT chest that suggested RML and lingular infiltrates.  She was treated at various times with Vanc, Rocephin, Levaquin, and Doxy and she was eventually changed to Ancef per ID recommendations and completed 4 weeks of therapy.  TTE and TEE were negative.  She "felt great" upon completing her antibiotics on 7/5.    She had a bilateral TKR 14 years ago and on Thursday (8/2) noticed R knee stiffness/pain.  On Friday (8/3) it was swollen and hot, presented to [facility] 08/11/16 with acutely increased swelling and pain, in the ER Grossly purulent fluid on right knee arthrocentesis, admitted with septic arthritis and underwent I&D of R knee with ply exchange.  TEE this admission was + for vegetation on RA lead, and transferred to [facility] for device extraction (given Dr. Ladona Ridgel was away).  She underwent extraction of her device by Dr. Gary Fleet on 08/16/16.  08/29/2016  - She comes today accompanied by her husband.  She is doing pretty well, ambulating with a walker, remains with PICC line and saw ID earlier this week, reports being told another 4 weeks of IV antibiotics and likely will need lifelong oral antibiotics afterwards.  She denies any kind of CP, no palpitations or SOB, no dizziness, near syncope or syncope.  She did not want to have follow up for wound care/check  at Dr. Shon Baton, wanted to come back here.  She feels like she is making slow steady progress, no fever or symptoms of illness.  09/24/2016 - 3 weeks follow-up, the patient feels and looks significantly better. She finally Harris color in her cheeks and her energy level has improved significantly. She denies any chest pain or shortness of breath, she has persistent swelling and pain in her right knee that was operated on on 08/11/2016. Her right lower extremity has mild lower extremity edema but not the left lower extremity. She has no orthopnea or personal nocturnal dyspnea no dizziness palpitation or syncope. She has mild tenderness at night in her left upper chest wall where her biventricular ICD was explanted. She continues to use daily IV antibiotics, currently completing 6 weeks course and is following up with ID tomorrow. She might require lifelong antibiotics, hopefully PO.  10/07/2016 - the patient is coming after 2 weeks, she continues with the antibiotics that were now switch to by mouth, she is using Keflex and rifampin. She is to follow with ID in 2 weeks. Since the last visit she has not based progressive worsening shortness of breath and lack of energy, she is able to do activities of daily living such as cooking but gets profoundly tired and has to take several breaks. She denies any lower extremity edema orthopnea or proximal nocturnal dyspnea. She feels PVCs no longer palpitations dizziness or falls. She walks with a walker.  11/07/2016 - 1 month follow up, she was seen by Dr Graciela Husbands who  states: The patient has worsening cardiomyopathy following device extraction. This unfortunately occurred in the context of recurrent bacteremia and septic arthritis for which she is now on chronic suppressive antibiotics. I have spoken with ID as well as Dr. Delton See today. Medical therapy options remain for her cardiomyopathy and in the context of unknown risks of recurrent infection and bacteremia it seems  like this would be the preferred route. Dr. Algis Liming from ID will be looking into recurrent infection risks. We then may have to try to extrapolate risk of recurrent bacteremia so as to best be able to advise best as to her risks of recurrent device implantation  11/07/2016 - the patient states that she has no energy and sometimes she does not feel like getting up however she pushes herself to go through the day.  She has been noticing skipped beats and palpitations but no dizziness or syncope.  She uses Lasix only as needed and has mild residual edema in her right lower extremity where her knee infection is.  She has no orthopnea or paroxysmal nocturnal dyspnea.  DEVICE information: Biotronik CRT-D, implanted 07/04/14, Dr. Graciela Husbands EXTRACTED 2/2 MSSA bacteremia 08/16/16, Dr. Gary Fleet at [facility]  Past Medical History:  Diagnosis Date  . 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 Arthritis    "finger joints" (06/21/2016)  . Asthma   . CHF (congestive heart failure) (HCC)   . Congestive dilated cardiomyopathy (HCC) 03/31/2014   a. s/p Biotronik CRTD 06/2014  . GERD (gastroesophageal reflux disease)   . History of blood transfusion 1990s   "related to hysterectomy"  . Hypothyroidism   . Left bundle branch block   . MSSA bacteremia 06/21/2016  . Myocardial infarction (HCC)   . Pancreatitis, acute     Past Surgical History:  Procedure Laterality Date  . ABDOMINAL HYSTERECTOMY    . APPENDECTOMY    . CARDIAC CATHETERIZATION    . CARDIAC DEFIBRILLATOR PLACEMENT  ?2016  . CATARACT EXTRACTION W/ INTRAOCULAR LENS  IMPLANT, BILATERAL Bilateral   . CHOLECYSTECTOMY OPEN     "related to pancreatitis"  . DILATION AND CURETTAGE OF UTERUS    . EP IMPLANTABLE DEVICE N/A 07/04/2014   Procedure: BiV ICD Insertion CRT-D;  Surgeon: Duke Salvia, MD;  Location: [facility] INVASIVE CV LAB;  Service: Cardiovascular;  Laterality: N/A;  .  HERNIA REPAIR    . I&D KNEE WITH POLY EXCHANGE Right 08/11/2016   Procedure: IRRIGATION AND DEBRIDEMENT KNEE WITH POLY EXCHANGE;  Surgeon: Samson Frederic, MD;  Location: [facility] OR;  Service: Orthopedics;  Laterality: Right;  . INSERT / REPLACE / REMOVE PACEMAKER  ?2016  . JOINT REPLACEMENT Bilateral    "thumb joints; used my own material"  . JOINT REPLACEMENT    . LEFT HEART CATHETERIZATION WITH CORONARY ANGIOGRAM N/A 11/02/2013   Procedure: LEFT HEART CATHETERIZATION WITH CORONARY ANGIOGRAM;  Surgeon: Corky Crafts, MD;  Location: [facility] CATH LAB;  Service: Cardiovascular;  Laterality: N/A;  . LOOP RECORDER IMPLANT N/A 04/13/2014   Procedure: LOOP RECORDER IMPLANT;  Surgeon: Duke Salvia, MD;  Location: [facility] CATH LAB;  Service: Cardiovascular;  Laterality: N/A;  . LOOP RECORDER REMOVAL  ?2016  . POSTERIOR LUMBAR FUSION  05/03/2016   Lumbar Two-Three, Lumbar Three-Four Posterior Lumbar Fusion withLaminectomy and Foraminotomy  . SPINE SURGERY  04/2016   lumbar   . TEE WITHOUT CARDIOVERSION N/A 08/14/2016   Procedure: TRANSESOPHAGEAL ECHOCARDIOGRAM (TEE);  Surgeon: Lars Masson, MD;  Location: [facility] ENDOSCOPY;  Service: Cardiovascular;  Laterality: N/A;  . TONSILLECTOMY    . TOTAL HIP ARTHROPLASTY Bilateral   . UMBILICAL HERNIA REPAIR  1970s?    Current Outpatient Prescriptions  Medication Sig Dispense Refill  . albuterol (PROAIR HFA) 108 (90 Base) MCG/ACT inhaler Inhale 2 puffs into the lungs every 6 (six) hours as needed for wheezing or shortness of breath.    [name] alum & mag hydroxide-simeth (MAALOX/MYLANTA) 200-200-20 MG/5ML suspension Take 30 mLs by mouth every 4 (four) hours as needed for indigestion. 355 mL 0  . aspirin EC 81 MG tablet Take 81 mg by mouth daily.    [name] aspirin-acetaminophen-caffeine (EXCEDRIN MIGRAINE) 250-250-65 MG per tablet Take 1-2 tablets by mouth every 6 (six) hours as needed for headache or migraine.     [name] augmented betamethasone dipropionate (DIPROLENE-AF) 0.05 %  ointment Apply 1 application topically daily as needed (acrodermatitis [place] on fingers/toes).     . Calcium Carbonate Antacid (TUMS PO) Take 2 tablets by mouth daily as needed (indigestion).    . carvedilol (COREG) 12.5 MG tablet Take 1 tablet (12.5 mg total) by mouth 2 (two) times daily. 180 tablet 3  . cephALEXin (KEFLEX) 500 MG capsule Take 1 capsule (500 mg total) by mouth 4 (four) times daily. 120 capsule 11  . diphenhydrAMINE (BENADRYL) 12.5 MG/5ML elixir Take 5-10 mLs (12.5-25 mg total) by mouth every 4 (four) hours as needed for itching. 120 mL 0  . docusate sodium (COLACE) 100 MG capsule Take 1 capsule (100 mg total) by mouth 2 (two) times daily. 10 capsule 0  . feeding supplement, ENSURE ENLIVE, (ENSURE ENLIVE) LIQD Take 237 mLs by mouth 2 (two) times daily between meals. 237 mL 12  . furosemide (LASIX) 20 MG tablet TAKE 1 TABLET DAILY AS NEEDED FOR EDEMA 90 tablet 2  . levothyroxine (SYNTHROID, LEVOTHROID) 112 MCG tablet Take 112 mcg by mouth daily before breakfast.    . Melatonin 5 MG TABS Take 15 mg by mouth at bedtime as needed (for sleep.).     [name] menthol-cetylpyridinium (CEPACOL) 3 MG lozenge Take 1 lozenge (3 mg total) by mouth as needed for sore throat (sore throat). 100 tablet 12  . methocarbamol (ROBAXIN) 500 MG tablet Take 1 tablet (500 mg total) by mouth every 6 (six) hours as needed for muscle spasms. 10 tablet 0  . methylphenidate (RITALIN) 20 MG tablet Take 20 mg by mouth 2 (two) times daily as needed (narcolepsy).     [name] metoCLOPramide (REGLAN) 5 MG tablet Take 1-2 tablets (5-10 mg total) by mouth every 8 (eight) hours as needed for nausea (if ondansetron (ZOFRAN) ineffective.). 10 tablet 0  . Naphazoline-Pheniramine (ALLERGY EYE OP) Place 1-2 drops into both eyes daily as needed (for irritated eyes).    . ondansetron (ZOFRAN) 4 MG tablet Take 1 tablet (4 mg total) by mouth every 6 (six) hours as needed for nausea. 20 tablet 0  . polyethylene glycol (MIRALAX / GLYCOLAX)  packet Take 17 g by mouth daily as needed for mild constipation. 14 each 0  . Probiotic Product (PROBIOTIC PO) Take 1 capsule by mouth at bedtime.     . rifampin (RIFADIN) 300 MG capsule Take 1 capsule (300 mg total) by mouth every 12 (twelve) hours. 60 capsule 11  . rifampin (RIFADIN) 300 MG capsule TAKE 1 CAPSULE(300 MG) BY MOUTH EVERY 12 HOURS 60 capsule 0  . senna (SENOKOT) 8.6 MG TABS tablet Take 2 tablets (17.2 mg  total) by mouth at bedtime. 120 each 0  . losartan (COZAAR) 50 MG tablet Take 1 tablet (50 mg total) by mouth daily. (Patient taking differently: Take 25-50 mg by mouth See admin instructions. 25mg  daily Then take an additional 25mg  when BP elevated) 30 tablet 11   No current facility-administered medications for this visit.     Allergies:   Sulfa antibiotics   Social History:  The patient  reports that she has never smoked. She has never used smokeless tobacco. She reports that she drinks alcohol. She reports that she does not use drugs.   Family History:  The patient's family history includes Arrhythmia in her mother; Heart attack in her father, maternal grandfather, and paternal grandfather; Heart disease in her father; Hyperlipidemia in her mother.  ROS:  Please see the history of present illness.  All other systems are reviewed and otherwise negative.   PHYSICAL EXAM:  VS:  BP 124/64   Pulse 72   Ht 4\' 10"  (1.473 m)   Wt 150 lb (68 kg)   LMP  (LMP Unknown)   SpO2 97%   BMI 31.35 kg/m  BMI: Body mass index is 31.35 kg/m. Well nourished, well developed, in no acute distress  HEENT: normocephalic, atraumatic  Neck: no JVD, carotid bruits or masses Cardiac: RRR ; no significant murmurs, no rubs, or gallops Lungs: CTA b/l, no wheezing, rhonchi or rales , eschar in the left upper thoracic low post CRT-D explantation with no increase warmth or erythema, mild tenderness no discharge. Abd: soft, nontender MS: no deformity or atrophy Ext: trace-1+no edema in the right  lower extremity, increased warmth around the knee joint with persistent mild swelling. Right upper extremity has a PICC line that doesn't seem to be infected. Skin: warm and dry, no rash Neuro:  No gross deficits appreciated Psych: euthymic mood, full affect  ICD extraction site: dressing/steri strips were removed well healed.  No erythema, edema, no increased heat to the tissues, no fluid collection or tenderness.   EKG:  Done today and reviewed by myself is SR, LBBB, QRS 156ms  08/14/16: TEE Study Conclusions - Left ventricle: Systolic function was normal. The estimated   ejection fraction was in the range of 55% to 60%. Wall motion was   normal; there were no regional wall motion abnormalities. - Aortic valve: No evidence of vegetation. There was mild   regurgitation. - Aorta: There was mild non-mobile atheroma. - Mitral valve: There was mild regurgitation. - Left atrium: No evidence of thrombus in the atrial cavity or   appendage. No evidence of thrombus in the atrial cavity or   appendage. - Right atrium: No evidence of thrombus in the atrial cavity or   appendage. No evidence of thrombus in the atrial cavity or   appendage. - Tricuspid valve: No evidence of vegetation. There was mild   regurgitation. - Pulmonic valve: No evidence of vegetation. Impressions: - There is a highly mobile echodensity measuring 13 x 3 mm attached   to the right atrial lead in the right atrium at the tricuspid   valve level. This is consistent with pacemaker endocarditis.   10/17/15: limited echo for AV optimization Impressions: - The study is performed for AV optimization   There is grade 1 diastolic dysfunction   The optimal AV delay seems to be around 90-100 ms but i would   defer to the doctors managing the pacer.  02/01/15: TTE Study Conclusions - Left ventricle: The cavity size was normal. There was  moderate   concentric hypertrophy. Systolic function was normal. The   estimated  ejection fraction was in the range of 50% to 55%. Wall   motion was normal; there were no regional wall motion   abnormalities. Doppler parameters are consistent with abnormal   left ventricular relaxation (grade 1 diastolic dysfunction). The   E/e&' ratio is >15, suggesting elevated LV filling pressure. - Mitral valve: Calcified annulus. Mildly thickened leaflets .   There was trivial regurgitation. - Left atrium: The atrium was normal in size. - Right ventricle: The cavity size was normal. Wall thickness was   normal. Pacer wire or catheter noted in right ventricle. Systolic   function was normal. - Right atrium: The atrium was normal in size. Pacer wire or   catheter noted in right atrium. - Tricuspid valve: There was moderate regurgitation. - Pulmonary arteries: PA peak pressure: 30 mm Hg (S). - Inferior vena cava: The vessel was normal in size. The   respirophasic diameter changes were in the normal range (= 50%),   consistent with normal central venous pressure. Impressions: - Compared to a prior study in 09/2014, the EF has improved to   50-55%.  Recent Labs: 12/07/2015: Brain Natriuretic Peptide 33.0 06/22/2016: Magnesium 1.9 08/12/2016: TSH 0.995 09/25/2016: ALT 7; BUN 24; Creat 0.86; Hemoglobin 11.4; Platelets 226; Potassium 4.5; Sodium 136  12/07/2015: Cholesterol 231; HDL 61; LDL Cholesterol 144; Total CHOL/HDL Ratio 3.8; Triglycerides 131; VLDL 26   CrCl cannot be calculated (Patient's most recent lab result is older than the maximum 21 days allowed.).   Wt Readings from Last 3 Encounters:  11/07/16 150 lb (68 kg)  11/01/16 151 lb (68.5 kg)  10/07/16 150 lb (68 kg)     Other studies reviewed: Additional studies/records reviewed today include: summarized above  EKG performed today 10/07/2016 shows normal sinus rhythm with left axis deviation and left bundle branch block QRS flanks 168 ms that is prolonged from prior day was 156 ms. This was personally  reviewed.   ASSESSMENT AND PLAN:  1. NICM with interval improvement of her EF 35-40% -->50-55%, however CRT-D endocarditis - s/p CRT-D explantation on 08/16/16 --> repeat ECG prolongation of QRS from 122 ms while on by the pacing, post 6 mentation patient back and left bundle branch block with QRS 168 ms at the last visit, LVEF dropped to 30% on the echo on 10/07/2016. - Worsening symptoms of fatigue and shortness of breath back to pre-CRT-D time. - She is euvolemic, lasix just as needed - d/c losarta, start Entresto 26/24 po BID, follow up on 11/15 to see if she tolerates it as her BP tebds to get low. If she is hypotensive I will decrease carvedilol to 6.25 mg po BID - if her BP is ok at the next visit, I will start spironolactone 12.5 mg po daily. - potential reimplantation of the device as above - I will follow Dr Odessa Fleming and [facility] decision  2. CRT-D - s/p device extraction with recurrent MSSA bacteremia and vegetation - as above - On antibiotics long-term potentially life long.  3. HTN - Controlled.  Disposition: Follow up on 11/21/16 Current medicines are reviewed at length with the patient today.  The patient did not have any concerns regarding medicines.  Signed, Tobias Alexander, MD 11/07/2016 12:19 PM

## 2016-11-21 ENCOUNTER — Ambulatory Visit: Payer: Medicare Other | Admitting: Cardiology

## 2016-11-22 ENCOUNTER — Ambulatory Visit (INDEPENDENT_AMBULATORY_CARE_PROVIDER_SITE_OTHER): Payer: Medicare Other | Admitting: Cardiology

## 2016-11-22 ENCOUNTER — Other Ambulatory Visit: Payer: Self-pay | Admitting: *Deleted

## 2016-11-22 ENCOUNTER — Encounter: Payer: Self-pay | Admitting: Cardiology

## 2016-11-22 VITALS — BP 124/70 | HR 84 | Ht <= 58 in | Wt 151.0 lb

## 2016-11-22 DIAGNOSIS — I5043 Acute on chronic combined systolic (congestive) and diastolic (congestive) heart failure: Secondary | ICD-10-CM

## 2016-11-22 DIAGNOSIS — R072 Precordial pain: Secondary | ICD-10-CM | POA: Diagnosis not present

## 2016-11-22 DIAGNOSIS — I447 Left bundle-branch block, unspecified: Secondary | ICD-10-CM

## 2016-11-22 DIAGNOSIS — R002 Palpitations: Secondary | ICD-10-CM | POA: Diagnosis not present

## 2016-11-22 DIAGNOSIS — R7881 Bacteremia: Secondary | ICD-10-CM | POA: Diagnosis not present

## 2016-11-22 DIAGNOSIS — I428 Other cardiomyopathies: Secondary | ICD-10-CM | POA: Diagnosis not present

## 2016-11-22 DIAGNOSIS — B9561 Methicillin susceptible Staphylococcus aureus infection as the cause of diseases classified elsewhere: Secondary | ICD-10-CM

## 2016-11-22 DIAGNOSIS — Z95810 Presence of automatic (implantable) cardiac defibrillator: Secondary | ICD-10-CM | POA: Diagnosis not present

## 2016-11-22 MED ORDER — CARVEDILOL 12.5 MG PO TABS: 18.7500 mg | ORAL_TABLET | Freq: Two times a day (BID) | ORAL | 3 refills | Status: DC

## 2016-11-22 MED ORDER — NITROGLYCERIN 0.4 MG SL SUBL: 0.4000 mg | SUBLINGUAL_TABLET | SUBLINGUAL | 3 refills | Status: AC | PRN

## 2016-11-22 NOTE — Progress Notes (Signed)
Cardiology Office Note Date:  11/22/2016  Patient ID:  [name] [name], [name] [name] [birth year], MRN [id number] PCP:  [name], MD  Cardiologist:  Dr. Delton See Electrophysiologist: Dr. Graciela Husbands   Chief Complaint: Two-week follow-up, worsening fatigue shortness of breath.  History of Present Illness: [name] [name] is a 81 y.o. female with history of NICM w/hx of CRT-D though extracted 2/2 recurrent MSSA bacteremia,  asthma, LBBB, chronic CHF (systolic), hypothyroidism.  She was admitted to [facility] initially 6/7-18 with MSSA bacteremia of uncertain etiology, during this time severe sepsis and had 2/2 blood cultures positive on 6/7.  She had an LP and a CT chest that suggested RML and lingular infiltrates.  She was treated at various times with Vanc, Rocephin, Levaquin, and Doxy and she was eventually changed to Ancef per ID recommendations and completed 4 weeks of therapy.  TTE and TEE were negative.  She "felt great" upon completing her antibiotics on 7/5.    She had a bilateral TKR 14 years ago and on Thursday (8/2) noticed R knee stiffness/pain.  On Friday (8/3) it was swollen and hot, presented to [place] 08/11/16 with acutely increased swelling and pain, in the ER Grossly purulent fluid on right knee arthrocentesis, admitted with septic arthritis and underwent I&D of R knee with ply exchange.  TEE this admission was + for vegetation on RA lead, and transferred to [facility] for device extraction (given Dr. Ladona Ridgel was away).  She underwent extraction of her device by Dr. Gary Fleet on 08/16/16.  08/29/2016  - She comes today accompanied by her husband.  She is doing pretty well, ambulating with a walker, remains with PICC line and saw ID earlier this week, reports being told another 4 weeks of IV antibiotics and likely will need lifelong oral antibiotics afterwards.  She denies any kind of CP, no palpitations or SOB, no dizziness, near syncope or syncope.  She did not want to have follow up for wound care/check  at Dr. Shon Baton, wanted to come back here.  She feels like she is making slow steady progress, no fever or symptoms of illness.  09/24/2016 - 3 weeks follow-up, the patient feels and looks significantly better. She finally Harris color in her cheeks and her energy level has improved significantly. She denies any chest pain or shortness of breath, she has persistent swelling and pain in her right knee that was operated on on 08/11/2016. Her right lower extremity has mild lower extremity edema but not the left lower extremity. She has no orthopnea or personal nocturnal dyspnea no dizziness palpitation or syncope. She has mild tenderness at night in her left upper chest wall where her biventricular ICD was explanted. She continues to use daily IV antibiotics, currently completing 6 weeks course and is following up with ID tomorrow. She might require lifelong antibiotics, hopefully PO.  10/07/2016 - the patient is coming after 2 weeks, she continues with the antibiotics that were now switch to by mouth, she is using Keflex and rifampin. She is to follow with ID in 2 weeks. Since the last visit she has not based progressive worsening shortness of breath and lack of energy, she is able to do activities of daily living such as cooking but gets profoundly tired and has to take several breaks. She denies any lower extremity edema orthopnea or proximal nocturnal dyspnea. She feels PVCs no longer palpitations dizziness or falls. She walks with a walker.  11/07/2016 - 1 month follow up, she was seen by Dr Graciela Husbands who  states: The patient has worsening cardiomyopathy following device extraction. This unfortunately occurred in the context of recurrent bacteremia and septic arthritis for which she is now on chronic suppressive antibiotics. I have spoken with ID as well as Dr. Delton See today. Medical therapy options remain for her cardiomyopathy and in the context of unknown risks of recurrent infection and bacteremia it seems  like this would be the preferred route. Dr. Algis [name] from ID will be looking into recurrent infection risks. We then may have to try to extrapolate risk of recurrent bacteremia so as to best be able to advise best as to her risks of recurrent device implantation  11/07/2016 - the patient states that she has no energy and sometimes she does not feel like getting up however she pushes herself to go through the day.  She has been noticing skipped beats and palpitations but no dizziness or syncope.  She uses Lasix only as needed and has mild residual edema in her right lower extremity where her knee infection is.  She has no orthopnea or paroxysmal nocturnal dyspnea.  11/22/2016 - this 2 week follow-up, the patient states that she feels like she has slightly more energy with Entresto, for example she started to cook again however she's been experiencing retrosternal chest pains associated palpitations that would last 2-3 minutes then resolve as well as retrosternal chest pressure. She hasn't tried nitroglycerin. She has been experiencing gastric reflux as well. She has been experiencing more frequent palpitations that are associated with dizziness but no syncope. She denies any lower extremity edema orthopnea or proximal nocturnal dyspnea. She has also been complaining of cough productive of yellow sputum for about a week, no fever or chills.   DEVICE information: Biotronik CRT-D, implanted 07/04/14, Dr. Graciela Husbands EXTRACTED 2/2 MSSA bacteremia 08/16/16, Dr. Gary Fleet at [facility]  Past Medical History:  Diagnosis Date  . 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 Arthritis    "finger joints" (06/21/2016)  . Asthma   . CHF (congestive heart failure) (HCC)   . Congestive dilated cardiomyopathy (HCC) 03/31/2014   a. s/p Biotronik CRTD 06/2014  . GERD (gastroesophageal reflux disease)   . History of blood transfusion 1990s    "related to hysterectomy"  . Hypothyroidism   . Left bundle branch block   . MSSA bacteremia 06/21/2016  . Myocardial infarction (HCC)   . Pancreatitis, acute     Past Surgical History:  Procedure Laterality Date  . ABDOMINAL HYSTERECTOMY    . APPENDECTOMY    . BiV ICD Insertion CRT-D N/A 07/04/2014   Performed by Duke Salvia, MD at [facility] INVASIVE CV LAB  . CARDIAC CATHETERIZATION    . CARDIAC DEFIBRILLATOR PLACEMENT  ?2016  . CATARACT EXTRACTION W/ INTRAOCULAR LENS  IMPLANT, BILATERAL Bilateral   . CHOLECYSTECTOMY OPEN     "related to pancreatitis"  . DILATION AND CURETTAGE OF UTERUS    . HERNIA REPAIR    . INSERT / REPLACE / REMOVE PACEMAKER  ?2016  . IRRIGATION AND DEBRIDEMENT KNEE WITH POLY EXCHANGE Right 08/11/2016   Performed by Samson Frederic, MD at [facility] OR  . JOINT REPLACEMENT Bilateral    "thumb joints; used my own material"  . JOINT REPLACEMENT    . LEFT HEART CATHETERIZATION WITH CORONARY ANGIOGRAM N/A 11/02/2013   Performed by Corky Crafts, MD at [facility] CATH LAB  . LOOP RECORDER IMPLANT N/A 04/13/2014   Performed  by Duke SalviaKlein, Steven C[facility]  . LOOP RECORDER REMOVAL  ?2016  . Lumbar Two-Three, Lumbar Three-Four Posterior Lumbar Fusion withLaminectomy and Foraminotomy N/A 05/03/2016   Per[facility] Tia AlertJones[facility] David S[facility]  . POSTERIOR LUMBAR FUSION  05/03/2016   Lumbar Two-Three, Lumbar Three-Four Posterior Lumbar Fusion withLaminectomy and Foraminotomy  . SPINE SURGERY  04/2016   lumbar   . TONSILLECTOMY    . TOTAL HIP ARTHROPLASTY Bilateral   . TRANSESOPHAGEAL ECHOCARDIOGRAM (TEE) N/A 08/14/2016   Performed by Lars MassonNelson, Christie Viscomi H, [facility]  . UMBILICAL HERNIA REPAIR  1970s?    Current Outpatient Medications  Medication Sig Dispense Refill  . albuterol (PROAIR HFA) 108 (90 Base) MCG/ACT inhaler Inhale 2 puffs into the lungs every 6 (six) hours as needed for wheezing or shortness of breath.    [name]. alum & mag hydroxide-simeth (MAALOX/MYLANTA)  200-200-20 MG/5ML suspension Take 30 mLs by mouth every 4 (four) hours as needed for indigestion. 355 mL 0  . aspirin EC 81 MG tablet Take 81 mg by mouth daily.    [name]. aspirin-acetaminophen-caffeine (EXCEDRIN MIGRAINE) 250-250-65 MG per tablet Take 1-2 tablets by mouth every 6 (six) hours as needed for headache or migraine.     [name]. augmented betamethasone dipropionate (DIPROLENE-AF) 0.05 % ointment Apply 1 application topically daily as needed (acrodermatitis [place] on fingers/toes).     . Calcium Carbonate Antacid (TUMS PO) Take 2 tablets by mouth daily as needed (indigestion).    . carvedilol (COREG) 12.5 MG tablet Take 1 tablet (12.5 mg total) by mouth 2 (two) times daily. 180 tablet 3  . cephALEXin (KEFLEX) 500 MG capsule Take 1 capsule (500 mg total) by mouth 4 (four) times daily. 120 capsule 11  . diphenhydrAMINE (BENADRYL) 12.5 MG/5ML elixir Take 5-10 mLs (12.5-25 mg total) by mouth every 4 (four) hours as needed for itching. 120 mL 0  . docusate sodium (COLACE) 100 MG capsule Take 1 capsule (100 mg total) by mouth 2 (two) times daily. 10 capsule 0  . feeding supplement, ENSURE ENLIVE, (ENSURE ENLIVE) LIQD Take 237 mLs by mouth 2 (two) times daily between meals. 237 mL 12  . furosemide (LASIX) 20 MG tablet TAKE 1 TABLET DAILY AS NEEDED FOR EDEMA 90 tablet 2  . levothyroxine (SYNTHROID, LEVOTHROID) 112 MCG tablet Take 112 mcg by mouth daily before breakfast.    . Melatonin 5 MG TABS Take 15 mg by mouth at bedtime as needed (for sleep.).     [name]. menthol-cetylpyridinium (CEPACOL) 3 MG lozenge Take 1 lozenge (3 mg total) by mouth as needed for sore throat (sore throat). 100 tablet 12  . methocarbamol (ROBAXIN) 500 MG tablet Take 1 tablet (500 mg total) by mouth every 6 (six) hours as needed for muscle spasms. 10 tablet 0  . methylphenidate (RITALIN) 20 MG tablet Take 20 mg by mouth 2 (two) times daily as needed (narcolepsy).     [name]. metoCLOPramide (REGLAN) 5 MG tablet Take 1-2 tablets (5-10 mg total)  by mouth every 8 (eight) hours as needed for nausea (if ondansetron (ZOFRAN) ineffective.). 10 tablet 0  . Naphazoline-Pheniramine (ALLERGY EYE OP) Place 1-2 drops into both eyes daily as needed (for irritated eyes).    . ondansetron (ZOFRAN) 4 MG tablet Take 1 tablet (4 mg total) by mouth every 6 (six) hours as needed for nausea. 20 tablet 0  . polyethylene glycol (MIRALAX / GLYCOLAX) packet Take 17 g by mouth daily as needed for mild constipation.  14 each 0  . Probiotic Product (PROBIOTIC PO) Take 1 capsule by mouth at bedtime.     . rifampin (RIFADIN) 300 MG capsule TAKE 1 CAPSULE(300 MG) BY MOUTH EVERY 12 HOURS 60 capsule 0  . sacubitril-valsartan (ENTRESTO) 24-26 MG Take 1 tablet by mouth 2 (two) times daily. 60 tablet 1  . senna (SENOKOT) 8.6 MG TABS tablet Take 2 tablets (17.2 mg total) by mouth at bedtime. 120 each 0   No current facility-administered medications for this visit.     Allergies:   Sulfa antibiotics   Social History:  The patient  reports that  has never smoked. she has never used smokeless tobacco. She reports that she drinks alcohol. She reports that she does not use drugs.   Family History:  The patient's family history includes Arrhythmia in her mother; Heart attack in her father, maternal grandfather, and paternal grandfather; Heart disease in her father; Hyperlipidemia in her mother.  ROS:  Please see the history of present illness.  All other systems are reviewed and otherwise negative.   PHYSICAL EXAM:  VS:  BP 124/70   Pulse 84   Ht 4\' 10"  (1.473 m)   Wt 151 lb (68.5 kg)   LMP  (LMP Unknown)   SpO2 96%   BMI 31.56 kg/m  BMI: Body mass index is 31.56 kg/m. Well nourished, well developed, in no acute distress  HEENT: normocephalic, atraumatic  Neck: no JVD, carotid bruits or masses Cardiac: RRR ; no significant murmurs, no rubs, or gallops Lungs: CTA b/l, no wheezing, rhonchi or rales , eschar in the left upper thoracic low post CRT-D explantation with  no increase warmth or erythema, mild tenderness no discharge. Abd: soft, nontender MS: no deformity or atrophy Ext: trace-1+no edema in the right lower extremity, increased warmth around the knee joint with persistent mild swelling. Right upper extremity has a PICC line that doesn't seem to be infected. Skin: warm and dry, no rash Neuro:  No gross deficits appreciated Psych: euthymic mood, full affect  ICD extraction site: dressing/steri strips were removed well healed.  No erythema, edema, no increased heat to the tissues, no fluid collection or tenderness.   EKG:  Done today and reviewed by myself is SR, LBBB, QRS  08/14/16: TEE Study Conclusions - Left ventricle: Systolic function was normal. The estimated   ejection fraction was in the range of 55% to 60%. Wall motion was   normal; there were no regional wall motion abnormalities. - Aortic valve: No evidence of vegetation. There was mild   regurgitation. - Aorta: There was mild non-mobile atheroma. - Mitral valve: There was mild regurgitation. - Left atrium: No evidence of thrombus in the atrial cavity or   appendage. No evidence of thrombus in the atrial cavity or   appendage. - Right atrium: No evidence of thrombus in the atrial cavity or   appendage. No evidence of thrombus in the atrial cavity or   appendage. - Tricuspid valve: No evidence of vegetation. There was mild   regurgitation. - Pulmonic valve: No evidence of vegetation. Impressions: - There is a highly mobile echodensity measuring 13 x 3 mm attached   to the right atrial lead in the right atrium at the tricuspid   valve level. This is consistent with pacemaker endocarditis.   10/17/15: limited echo for AV optimization Impressions: - The study is performed for AV optimization   There is grade 1 diastolic dysfunction   The optimal AV delay seems to be around  90-100 ms but i would   defer to the doctors managing the pacer.  02/01/15: TTE Study  Conclusions - Left ventricle: The cavity size was normal. There was moderate   concentric hypertrophy. Systolic function was normal. The   estimated ejection fraction was in the range of 50% to 55%. Wall   motion was normal; there were no regional wall motion   abnormalities. Doppler parameters are consistent with abnormal   left ventricular relaxation (grade 1 diastolic dysfunction). The   E/e&' ratio is >15, suggesting elevated LV filling pressure. - Mitral valve: Calcified annulus. Mildly thickened leaflets .   There was trivial regurgitation. - Left atrium: The atrium was normal in size. - Right ventricle: The cavity size was normal. Wall thickness was   normal. Pacer wire or catheter noted in right ventricle. Systolic   function was normal. - Right atrium: The atrium was normal in size. Pacer wire or   catheter noted in right atrium. - Tricuspid valve: There was moderate regurgitation. - Pulmonary arteries: PA peak pressure: 30 mm Hg (S). - Inferior vena cava: The vessel was normal in size. The   respirophasic diameter changes were in the normal range (= 50%),   consistent with normal central venous pressure. Impressions: - Compared to a prior study in 09/2014, the EF has improved to   50-55%.  Recent Labs: 12/07/2015: Brain Natriuretic Peptide 33.0 06/22/2016: Magnesium 1.9 08/12/2016: TSH 0.995 09/25/2016: ALT 7; BUN 24; Creat 0.86; Hemoglobin 11.4; Platelets 226; Potassium 4.5; Sodium 136  12/07/2015: Cholesterol 231; HDL 61; LDL Cholesterol 144; Total CHOL/HDL Ratio 3.8; Triglycerides 131; VLDL 26   CrCl cannot be calculated (Patient's most recent lab result is older than the maximum 21 days allowed.).   Wt Readings from Last 3 Encounters:  11/22/16 151 lb (68.5 kg)  11/07/16 150 lb (68 kg)  11/01/16 151 lb (68.5 kg)     Other studies reviewed: Additional studies/records reviewed today include: summarized above  EKG performed today 10/07/2016 shows normal sinus rhythm  with left axis deviation and left bundle branch block QRS flanks 168 ms that is prolonged from prior day was 156 ms. This was personally reviewed.   ASSESSMENT AND PLAN:  1. NICM with interval improvement of her EF 35-40% -->50-55%, however CRT-D endocarditis - s/p CRT-D explantation on 08/16/16 --> repeat ECG prolongation of QRS from 122 ms while on by the pacing, post 6 mentation patient back and left bundle branch block with QRS 168 ms at the last visit, LVEF dropped to 30% on the echo on 10/07/2016. - Worsening symptoms of fatigue and shortness of breath back to pre-CRT-D time. - She is euvolemic, lasix just as needed - started on Entresto 26/24 po BID on 11/07/16, some improvement of symptoms - increase carvedilol to 18.75 mg po BID - if her BP is ok at the next visit, I will start spironolactone 12.5 mg po daily. - potential reimplantation of the device as above - I will follow Dr Odessa Fleming and [facility] decision, next follow up on 11/19 - concern about VTs, increase carvedilol, start Holter monitor 48 H - 0.4 NTG as needed x 1 for CP  2. CRT-D - s/p device extraction with recurrent MSSA bacteremia and vegetation - as above - On antibiotics long-term potentially life long.  3. HTN - Controlled.  4. Acute respiratory infection with productive sputum, I will avoid adding another antibiotic, she is advised to use guaifenesin, ordered counter cough drops, we might add antibiotics if she will chills  or her cough becomes worse and more productive.  Disposition: Follow up on 12/12/16 Current medicines are reviewed at length with the patient today.  The patient did not have any concerns regarding medicines.  Signed, Tobias Alexander, MD 11/22/2016 3:08 PM

## 2016-11-22 NOTE — Patient Instructions (Signed)
Medication Instructions:   INCREASE YOUR CARVEDILOL TO 18.75 MG TWICE DAILY  DR NELSON HAS PRESCRIBED YOU NITROGLYCERIN 0.4 MG  SUBLINGUAL (UNDER THE TONGUE) AS NEEDED FOR CHEST PAIN--FOLLOW THE INSTRUCTIONS ON THE BOTTLE CAREFULLY    Testing/Procedures:  Your physician has recommended that you wear a 48 HOUR holter monitor. Holter monitors are medical devices that record the heart's electrical activity. Doctors most often use these monitors to diagnose arrhythmias. Arrhythmias are problems with the speed or rhythm of the heartbeat. The monitor is a small, portable device. You can wear one while you do your normal daily activities. This is usually used to diagnose what is causing palpitations/syncope (passing out).     Follow-Up:  ADD PATIENT TO DR NELSON'S SCHEDULE FOR 11:00 AM SLOT ON 12/12/16       If you need a refill on your cardiac medications before your next appointment, please call your pharmacy.

## 2016-11-25 ENCOUNTER — Ambulatory Visit (INDEPENDENT_AMBULATORY_CARE_PROVIDER_SITE_OTHER): Payer: Medicare Other | Admitting: Infectious Disease

## 2016-11-25 ENCOUNTER — Encounter: Payer: Self-pay | Admitting: Infectious Disease

## 2016-11-25 VITALS — BP 128/74 | HR 66 | Temp 98.2°F | Ht <= 58 in | Wt 155.0 lb

## 2016-11-25 DIAGNOSIS — T8453XD Infection and inflammatory reaction due to internal right knee prosthesis, subsequent encounter: Secondary | ICD-10-CM

## 2016-11-25 DIAGNOSIS — I447 Left bundle-branch block, unspecified: Secondary | ICD-10-CM

## 2016-11-25 DIAGNOSIS — I5022 Chronic systolic (congestive) heart failure: Secondary | ICD-10-CM

## 2016-11-25 DIAGNOSIS — T827XXD Infection and inflammatory reaction due to other cardiac and vascular devices, implants and grafts, subsequent encounter: Secondary | ICD-10-CM | POA: Diagnosis not present

## 2016-11-25 DIAGNOSIS — Z981 Arthrodesis status: Secondary | ICD-10-CM

## 2016-11-25 DIAGNOSIS — R7881 Bacteremia: Secondary | ICD-10-CM

## 2016-11-25 DIAGNOSIS — B9561 Methicillin susceptible Staphylococcus aureus infection as the cause of diseases classified elsewhere: Secondary | ICD-10-CM

## 2016-11-25 NOTE — Progress Notes (Signed)
Subjective:   Fatigue is less now on new medication   Patient ID: [name] [name], female    DOB: June 26, 1935, 81 y.o.   MRN: [id number]  HPI  81 year old who underwent lumbar laminectomy by Dr. Ronnald Ramp on 05/03/16. Presented to [facility] 6/7 with fevers, weakness and diffuse pain. She developed sepsis/hypotension and was found to have MSSA bacteremia and transferred to [facility] 6/15 for continued care and assessment by EP team considering she has cardiac device. At [facility] she underwent evaluation for metastatic sites of infection including CT of spine, TTE/TEE and BCx monitoring. All repeat BCx were negative once transferred here. TTE/TEE were both negative for lead or valvular endocarditis at that time; received 4 weeks IV Ancef and decision to leave cardiac device.  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 She underwent single staged exchange arthroplasty on right knee by Dr. Delfino Lovett. Dr. Lovena Le was not available for device extraction here so she was transferred to [facility] where she had cardiac device extracted on August 10th. She had blood cultures and device cultures done with no growth. PICC inserted on 08/18/16. She was sent out on IV cefazolin 2g IV q 8 but also IV rifampin 300 mg IV q 12 at SNF and then home. Managing FIVE infusions was overwhelming for her husband. I changed her to rifampin '300mg'$  orally BID and then Ceftriaxone 2gram IV push to complete 42 days of therapy post extraction. Initially her cardiac function had mproved quite nicely. We placed her on high dose keflex and bid rifampin and she has been on these meds without fail since I last saw her. She is approaching in December 4 months of systemic antimicrobials since extraction of her device and a litle more since I and D and exchange of liner of prosthesis.    Her EF has worsened and EP and Cardiology are considering reimplantation  She still does have knee pain when she walks but this is so much better than before. No systemic ssx of infectiob  Past Medical History:  Diagnosis Date  . 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 Arthritis    "finger joints" (06/21/2016)  . Asthma   . CHF (congestive heart failure) ([place])   . Congestive dilated cardiomyopathy ([place]) 03/31/2014   a. s/p Biotronik CRTD 06/2014  . GERD (gastroesophageal reflux disease)   . History of blood transfusion 1990s   "related to hysterectomy"  . Hypothyroidism   . Left bundle branch block   . MSSA bacteremia 06/21/2016  . Myocardial infarction ([place])   . Pancreatitis, acute     Past Surgical History:  Procedure Laterality Date  . ABDOMINAL HYSTERECTOMY    . APPENDECTOMY    . BiV ICD Insertion CRT-D N/A 07/04/2014   Performed by Deboraha Sprang, MD at [facility]  . CARDIAC CATHETERIZATION    . CARDIAC DEFIBRILLATOR PLACEMENT  ?2016  . CATARACT EXTRACTION W/ INTRAOCULAR LENS  IMPLANT, BILATERAL Bilateral   . CHOLECYSTECTOMY OPEN     "related to pancreatitis"  . DILATION AND CURETTAGE OF UTERUS    . HERNIA REPAIR    . INSERT / REPLACE / REMOVE PACEMAKER  ?2016  . IRRIGATION AND DEBRIDEMENT KNEE WITH POLY EXCHANGE Right 08/11/2016  Performed by Rod Can, MD at [place] Bilateral    "thumb joints; used my own material"  . JOINT REPLACEMENT    . LEFT HEART CATHETERIZATION WITH CORONARY ANGIOGRAM N/A 11/02/2013   Performed by Jettie Booze, MD at [facility] CATH LAB  . LOOP RECORDER IMPLANT N/A 04/13/2014   Performed by Deboraha Sprang, MD at [facility] CATH LAB  . LOOP RECORDER REMOVAL  ?2016  . Lumbar Two-Three, Lumbar Three-Four Posterior Lumbar Fusion withLaminectomy and Foraminotomy N/A 05/03/2016   Performed by Eustace Moore, MD at [place]  . POSTERIOR  LUMBAR FUSION  05/03/2016   Lumbar Two-Three, Lumbar Three-Four Posterior Lumbar Fusion withLaminectomy and Foraminotomy  . SPINE SURGERY  04/2016   lumbar   . TONSILLECTOMY    . TOTAL HIP ARTHROPLASTY Bilateral   . TRANSESOPHAGEAL ECHOCARDIOGRAM (TEE) N/A 08/14/2016   Performed by Dorothy Spark, MD at [place]  . UMBILICAL HERNIA REPAIR  1970s?    Family History  Problem Relation Age of Onset  . Hyperlipidemia Mother   . Arrhythmia Mother        palpitations  . Heart disease Father   . Heart attack Father   . Heart attack Maternal Grandfather   . Heart attack Paternal Grandfather       Social History   Socioeconomic History  . Marital status: Married    Spouse name: None  . Number of children: None  . Years of education: None  . Highest education level: None  Social Needs  . Financial resource strain: None  . Food insecurity - worry: None  . Food insecurity - inability: None  . Transportation needs - medical: None  . Transportation needs - non-medical: None  Occupational History  . None  Tobacco Use  . Smoking status: Never Smoker  . Smokeless tobacco: Never Used  . Tobacco comment: "smoked as a teenager; for maybe 1 wk"  Substance and Sexual Activity  . Alcohol use: Yes    Comment: 6/15//2018 "glass of wine q 6 months or so"  . Drug use: No  . Sexual activity: None  Other Topics Concern  . None  Social History Narrative  . None    Allergies  Allergen Reactions  . Sulfa Antibiotics Hives     Current Outpatient Medications:  .  albuterol (PROAIR HFA) 108 (90 Base) MCG/ACT inhaler, Inhale 2 puffs into the lungs every 6 (six) hours as needed for wheezing or shortness of breath., Disp: , Rfl:  .  alum & mag hydroxide-simeth (MAALOX/MYLANTA) 200-200-20 MG/5ML suspension, Take 30 mLs by mouth every 4 (four) hours as needed for indigestion., Disp: 355 mL, Rfl: 0 .  aspirin EC 81 MG tablet, Take 81 mg by mouth daily., Disp: , Rfl:  .   aspirin-acetaminophen-caffeine (EXCEDRIN MIGRAINE) 250-250-65 MG per tablet, Take 1-2 tablets by mouth every 6 (six) hours as needed for headache or migraine. , Disp: , Rfl:  .  augmented betamethasone dipropionate (DIPROLENE-AF) 0.05 % ointment, Apply 1 application topically daily as needed (acrodermatitis [place] on fingers/toes). , Disp: , Rfl:  .  Calcium Carbonate Antacid (TUMS PO), Take 2 tablets by mouth daily as needed (indigestion)., Disp: , Rfl:  .  carvedilol (COREG) 12.5 MG tablet, Take 1.5 tablets (18.75 mg total) 2 (two) times daily by mouth., Disp: 180 tablet, Rfl: 3 .  cephALEXin (KEFLEX) 500 MG capsule, Take 1 capsule (500 mg total) by mouth 4 (four) times daily., Disp: 120 capsule, Rfl: 11 .  diphenhydrAMINE (BENADRYL) 12.5 MG/5ML elixir, Take 5-10 mLs (12.5-25 mg total) by mouth every 4 (four) hours as needed for itching., Disp: 120 mL, Rfl: 0 .  docusate sodium (COLACE) 100 MG capsule, Take 1 capsule (100 mg total) by mouth 2 (two) times daily., Disp: 10 capsule, Rfl: 0 .  feeding supplement, ENSURE ENLIVE, (ENSURE ENLIVE) LIQD, Take 237 mLs by mouth 2 (two) times daily between meals., Disp: 237 mL, Rfl: 12 .  furosemide (LASIX) 20 MG tablet, TAKE 1 TABLET DAILY AS NEEDED FOR EDEMA, Disp: 90 tablet, Rfl: 2 .  levothyroxine (SYNTHROID, LEVOTHROID) 112 MCG tablet, Take 112 mcg by mouth daily before breakfast., Disp: , Rfl:  .  Melatonin 5 MG TABS, Take 15 mg by mouth at bedtime as needed (for sleep.). , Disp: , Rfl:  .  menthol-cetylpyridinium (CEPACOL) 3 MG lozenge, Take 1 lozenge (3 mg total) by mouth as needed for sore throat (sore throat)., Disp: 100 tablet, Rfl: 12 .  methocarbamol (ROBAXIN) 500 MG tablet, Take 1 tablet (500 mg total) by mouth every 6 (six) hours as needed for muscle spasms., Disp: 10 tablet, Rfl: 0 .  methylphenidate (RITALIN) 20 MG tablet, Take 20 mg by mouth 2 (two) times daily as needed (narcolepsy). , Disp: , Rfl:  .  metoCLOPramide (REGLAN) 5 MG tablet,  Take 1-2 tablets (5-10 mg total) by mouth every 8 (eight) hours as needed for nausea (if ondansetron (ZOFRAN) ineffective.)., Disp: 10 tablet, Rfl: 0 .  Naphazoline-Pheniramine (ALLERGY EYE OP), Place 1-2 drops into both eyes daily as needed (for irritated eyes)., Disp: , Rfl:  .  nitroGLYCERIN (NITROSTAT) 0.4 MG SL tablet, Place 1 tablet (0.4 mg total) every 5 (five) minutes as needed under the tongue for chest pain., Disp: 90 tablet, Rfl: 3 .  ondansetron (ZOFRAN) 4 MG tablet, Take 1 tablet (4 mg total) by mouth every 6 (six) hours as needed for nausea., Disp: 20 tablet, Rfl: 0 .  polyethylene glycol (MIRALAX / GLYCOLAX) packet, Take 17 g by mouth daily as needed for mild constipation., Disp: 14 each, Rfl: 0 .  rifampin (RIFADIN) 300 MG capsule, TAKE 1 CAPSULE(300 MG) BY MOUTH EVERY 12 HOURS, Disp: 60 capsule, Rfl: 0 .  sacubitril-valsartan (ENTRESTO) 24-26 MG, Take 1 tablet by mouth 2 (two) times daily., Disp: 60 tablet, Rfl: 1 .  senna (SENOKOT) 8.6 MG TABS tablet, Take 2 tablets (17.2 mg total) by mouth at bedtime., Disp: 120 each, Rfl: 0 .  Probiotic Product (PROBIOTIC PO), Take 1 capsule by mouth at bedtime. , Disp: , Rfl:    Review of Systems  Constitutional: Negative for chills and fever.  HENT: Negative for congestion and sore throat.   Eyes: Negative for photophobia.  Respiratory: Negative for cough, shortness of breath and wheezing.   Cardiovascular: Negative for chest pain, palpitations and leg swelling.  Gastrointestinal: Negative for abdominal pain, blood in stool, constipation, diarrhea, nausea and vomiting.  Genitourinary: Negative for dysuria, flank pain and hematuria.  Musculoskeletal: Positive for myalgias. Negative for back pain and neck pain.  Skin: Positive for wound. Negative for rash.  Neurological: Negative for dizziness, weakness and headaches.  Hematological: Does not bruise/bleed easily.  Psychiatric/Behavioral: Negative for suicidal ideas.       Objective:    Physical Exam  Constitutional: She is oriented to person, place, and time. She appears well-developed and well-nourished. No distress.  HENT:  Head: Normocephalic and atraumatic.  Mouth/Throat: No oropharyngeal exudate.  Eyes: Conjunctivae and EOM are normal. No scleral icterus.  Neck: Normal range of motion. Neck supple.  Cardiovascular: Normal rate and regular rhythm.  Murmur heard.  Systolic murmur is present. RUSB  Pulmonary/Chest: Effort normal. No respiratory distress. She has no wheezes.    Abdominal: She exhibits no distension.  Musculoskeletal: She exhibits no edema or tenderness.       Legs: Neurological: She is alert and oriented to person, place, and time. She exhibits normal muscle tone. Coordination normal.  Skin: Skin is warm and dry. No rash noted. She is not diaphoretic. No erythema. No pallor.  Psychiatric: She has a normal mood and affect. Her behavior is normal. Judgment and thought content normal.  Nursing note and vitals reviewed.         Assessment & Plan:    ICD infection: sp extraction and she will have completed 42 days of systemic therapy post extraction and has been on oral abx for her PJI since then now approaching 4 months since extraction   In terms of her PJI, the standard effort is to get through 6 months of therapy though in many where risk of failure is high I personally continue on for much longer. With re to timing of her device re-implantation one could try to delay it until she had a full 6 months of therapy for the knee hoping that the risk for recurrence in the knee would then be lower and hence risk for bacteremia. However I would NOT be held hostage by such a concept. If she URGENTLY needs device I would support reimplantation. My intention is to keep her on antibiotics for years if not rest of her life due to my concerns of relapse int he knee and more importantly risk for bacteremia yet again.   PJI sp single staged procedure; higher  rate of failure with a 2 step. We will give her protracted oral therapy as stated above likely lifelong   Check ESR, CRP, CMP, CBC today  RTC to see me in 2 months  I spent greater than 25 minutes with the patient including greater than 50% of time in face to face counsel of the patient and husband re risk benefits we are trying to weigh here between reimplantation and infection with an unfortunately virulent organism and in coordination of her  care.

## 2016-11-26 LAB — CBC WITH DIFFERENTIAL/PLATELET
BASOS PCT: 1 %
Basophils Absolute: 48 cells/uL (ref 0–200)
Eosinophils Absolute: 634 cells/uL — ABNORMAL HIGH (ref 15–500)
Eosinophils Relative: 13.2 %
HCT: 34.9 % — ABNORMAL LOW (ref 35.0–45.0)
HEMOGLOBIN: 11.6 g/dL — AB (ref 11.7–15.5)
Lymphs Abs: 902 cells/uL (ref 850–3900)
MCH: 30.1 pg (ref 27.0–33.0)
MCHC: 33.2 g/dL (ref 32.0–36.0)
MCV: 90.4 fL (ref 80.0–100.0)
MONOS PCT: 7.9 %
MPV: 11.9 fL (ref 7.5–12.5)
NEUTROS ABS: 2837 {cells}/uL (ref 1500–7800)
Neutrophils Relative %: 59.1 %
PLATELETS: 173 10*3/uL (ref 140–400)
RBC: 3.86 10*6/uL (ref 3.80–5.10)
RDW: 14.4 % (ref 11.0–15.0)
TOTAL LYMPHOCYTE: 18.8 %
WBC: 4.8 10*3/uL (ref 3.8–10.8)
WBCMIX: 379 {cells}/uL (ref 200–950)

## 2016-11-26 LAB — COMPLETE METABOLIC PANEL WITH GFR
AG Ratio: 1.4 (calc) (ref 1.0–2.5)
ALT: 8 U/L (ref 6–29)
AST: 14 U/L (ref 10–35)
Albumin: 4 g/dL (ref 3.6–5.1)
Alkaline phosphatase (APISO): 63 U/L (ref 33–130)
BILIRUBIN TOTAL: 0.3 mg/dL (ref 0.2–1.2)
BUN/Creatinine Ratio: 25 (calc) — ABNORMAL HIGH (ref 6–22)
BUN: 25 mg/dL (ref 7–25)
CALCIUM: 9.2 mg/dL (ref 8.6–10.4)
CHLORIDE: 106 mmol/L (ref 98–110)
CO2: 25 mmol/L (ref 20–32)
Creat: 1.02 mg/dL — ABNORMAL HIGH (ref 0.60–0.93)
GFR, EST AFRICAN AMERICAN: 61 mL/min/{1.73_m2} (ref >=60)
GFR, Est Non African American: 53 mL/min/{1.73_m2} — ABNORMAL LOW (ref >=60)
GLUCOSE: 95 mg/dL (ref 65–99)
Globulin: 2.8 g/dL (calc) (ref 1.9–3.7)
Potassium: 4.9 mmol/L (ref 3.5–5.3)
Sodium: 139 mmol/L (ref 135–146)
TOTAL PROTEIN: 6.8 g/dL (ref 6.1–8.1)

## 2016-11-26 LAB — SEDIMENTATION RATE: Sed Rate: 11 mm/h (ref 0–30)

## 2016-11-26 LAB — C-REACTIVE PROTEIN: CRP: 1.4 mg/L (ref <8.0)

## 2016-12-04 ENCOUNTER — Telehealth: Payer: Self-pay | Admitting: *Deleted

## 2016-12-04 NOTE — Telephone Encounter (Signed)
Called the pt to check in on her.  Pt states that other than having a cold at the time, she is doing ok.  Pt has an appt for a 48 hr holter monitor for next Tues 12/4, and will follow-up with Dr Delton See on next Thursday 12/6.  Advised the pt that if she states having any issues at all between now and next week, she should call us back.  Pt verbalized understanding and agrees with this plan.  Pt more than gracious for checking in to see how she's doing.  Will forward to Dr Delton See as an Lorain Childes.

## 2016-12-04 NOTE — Telephone Encounter (Signed)
Thank  You so much for calling her

## 2016-12-04 NOTE — Telephone Encounter (Signed)
-----  Message from Dorothy Spark, MD sent at 12/03/2016  3:54 PM EST ----- Can you please call her and ask how is she doing? Thank you! KN  ----- Message ----- From: Tommy Medal, Lavell Islam, MD Sent: 11/25/2016  11:53 AM To: Dorothy Spark, MD, Deboraha Sprang, MD  Clover Mealy. Tinleigh looks pretty good right now in terms of her knee. I am rechecking her ESR, CRP. If we wanted to push her to 6 months on oral abx therapy that would be the cookbook minimal duration after single staged exchange and could consider reimplantation after that. However I dont want [place] to be held hostage to that. If you think she needs the device more urgently I will support that. She is approachign month 4 already. AND I intend to keep her on lifelong suppressive therapy. There is too much risk with her PJI and her back infection that I can see recurrence happening easily off of abx and I would prefer for any and all recurrence to stay local to the knee or the back and not involve seeding of a new cardiac device My cell is [phone]

## 2016-12-10 ENCOUNTER — Ambulatory Visit (INDEPENDENT_AMBULATORY_CARE_PROVIDER_SITE_OTHER): Payer: Medicare Other

## 2016-12-10 DIAGNOSIS — R072 Precordial pain: Secondary | ICD-10-CM

## 2016-12-10 DIAGNOSIS — R002 Palpitations: Secondary | ICD-10-CM

## 2016-12-11 ENCOUNTER — Telehealth: Payer: Self-pay | Admitting: Cardiology

## 2016-12-11 NOTE — Telephone Encounter (Signed)
New message     Per Patient husband patient is still wearing monitor , she does not finish test until 12/6, should she keep her appointment on 12/6 since Dr Delton See will not have the results back yet?

## 2016-12-11 NOTE — Telephone Encounter (Signed)
Endorsed to the pt that she should keep her follow-up appt with Dr Delton See, as advised at the last OV.  Informed the pt that its ok that her results want be available by the visit, for the holters do take a bit to process and come back.  Informed the pt that once that result is back we will call her on the phone to endorse, but Dr Delton See wanted to still see her as an OV on 12/6, to reassess her and follow-up from note sent from her Infectious MD to Dr Delton See on 11/28.  Pt verbalized understanding and agrees with this plan.

## 2016-12-12 ENCOUNTER — Encounter: Payer: Self-pay | Admitting: Cardiology

## 2016-12-12 ENCOUNTER — Ambulatory Visit (INDEPENDENT_AMBULATORY_CARE_PROVIDER_SITE_OTHER): Payer: Medicare Other | Admitting: Cardiology

## 2016-12-12 VITALS — BP 134/66 | HR 70 | Ht <= 58 in | Wt 155.0 lb

## 2016-12-12 DIAGNOSIS — R7881 Bacteremia: Secondary | ICD-10-CM

## 2016-12-12 DIAGNOSIS — I5043 Acute on chronic combined systolic (congestive) and diastolic (congestive) heart failure: Secondary | ICD-10-CM

## 2016-12-12 DIAGNOSIS — B9561 Methicillin susceptible Staphylococcus aureus infection as the cause of diseases classified elsewhere: Secondary | ICD-10-CM

## 2016-12-12 DIAGNOSIS — I5022 Chronic systolic (congestive) heart failure: Secondary | ICD-10-CM

## 2016-12-12 DIAGNOSIS — R002 Palpitations: Secondary | ICD-10-CM

## 2016-12-12 DIAGNOSIS — Z95810 Presence of automatic (implantable) cardiac defibrillator: Secondary | ICD-10-CM | POA: Diagnosis not present

## 2016-12-12 DIAGNOSIS — I428 Other cardiomyopathies: Secondary | ICD-10-CM

## 2016-12-12 MED ORDER — SPIRONOLACTONE 25 MG PO TABS: 12.5000 mg | ORAL_TABLET | Freq: Every day | ORAL | 3 refills | Status: DC

## 2016-12-12 MED ORDER — FUROSEMIDE 20 MG PO TABS: 20.0000 mg | ORAL_TABLET | Freq: Every day | ORAL | 3 refills | Status: DC

## 2016-12-12 MED ORDER — SACUBITRIL-VALSARTAN 24-26 MG PO TABS: 1.0000 | ORAL_TABLET | Freq: Two times a day (BID) | ORAL | 2 refills | Status: DC

## 2016-12-12 NOTE — Patient Instructions (Signed)
Medication Instructions:   START TAKING LASIX 20 MG ONCE DAILY  START TAKING SPIRONOLACTONE 12.5 MG ONCE DAILY    Labwork  TODAY---BMET AND MAGNESIUM      Follow-Up:  ADD TO DR NELSON'S SCHEDULED FOR 12/25/16 AT 11:40 AM PER DR NELSON       If you need a refill on your cardiac medications before your next appointment, please call your pharmacy.

## 2016-12-12 NOTE — Progress Notes (Signed)
Cardiology Office Note Date:  12/12/2016  Patient ID:  [name] [name] 08/20/1935, MRN [id number] PCP:  [name] Mask, MD  Cardiologist:  Dr. Delton See Electrophysiologist: Dr. Graciela Husbands   Chief Complaint: Two-week follow-up, worsening fatigue shortness of breath.  History of Present Illness: [name] [name] is a 81 y.o. female with history of NICM w/hx of CRT-D though extracted 2/2 recurrent MSSA bacteremia,  asthma, LBBB, chronic CHF (systolic), hypothyroidism.  She was admitted to [facility] initially 6/7-18 with MSSA bacteremia of uncertain etiology, during this time severe sepsis and had 2/2 blood cultures positive on 6/7.  She had an LP and a CT chest that suggested RML and lingular infiltrates.  She was treated at various times with Vanc, Rocephin, Levaquin, and Doxy and she was eventually changed to Ancef per ID recommendations and completed 4 weeks of therapy.  TTE and TEE were negative.  She "felt great" upon completing her antibiotics on 7/5.    She had a bilateral TKR 14 years ago and on Thursday (8/2) noticed R knee stiffness/pain.  On Friday (8/3) it was swollen and hot, presented to [facility] 08/11/16 with acutely increased swelling and pain, in the ER Grossly purulent fluid on right knee arthrocentesis, admitted with septic arthritis and underwent I&D of R knee with ply exchange.  TEE this admission was + for vegetation on RA lead, and transferred to [facility] for device extraction (given Dr. Ladona Ridgel was away).  She underwent extraction of her device by Dr. Gary Fleet on 08/16/16.  08/29/2016  - She comes today accompanied by her husband.  She is doing pretty well, ambulating with a walker, remains with PICC line and saw ID earlier this week, reports being told another 4 weeks of IV antibiotics and likely will need lifelong oral antibiotics afterwards.  She denies any kind of CP, no palpitations or SOB, no dizziness, near syncope or syncope.  She did not want to have follow up for wound care/check  at Dr. Shon Baton, wanted to come back here.  She feels like she is making slow steady progress, no fever or symptoms of illness.  09/24/2016 - 3 weeks follow-up, the patient feels and looks significantly better. She finally Harris color in her cheeks and her energy level has improved significantly. She denies any chest pain or shortness of breath, she has persistent swelling and pain in her right knee that was operated on on 08/11/2016. Her right lower extremity has mild lower extremity edema but not the left lower extremity. She has no orthopnea or personal nocturnal dyspnea no dizziness palpitation or syncope. She has mild tenderness at night in her left upper chest wall where her biventricular ICD was explanted. She continues to use daily IV antibiotics, currently completing 6 weeks course and is following up with ID tomorrow. She might require lifelong antibiotics, hopefully PO.  10/07/2016 - the patient is coming after 2 weeks, she continues with the antibiotics that were now switch to by mouth, she is using Keflex and rifampin. She is to follow with ID in 2 weeks. Since the last visit she has not based progressive worsening shortness of breath and lack of energy, she is able to do activities of daily living such as cooking but gets profoundly tired and has to take several breaks. She denies any lower extremity edema orthopnea or proximal nocturnal dyspnea. She feels PVCs no longer palpitations dizziness or falls. She walks with a walker.  11/07/2016 - 1 month follow up, she was seen by Dr Graciela Husbands who  states: The patient has worsening cardiomyopathy following device extraction. This unfortunately occurred in the context of recurrent bacteremia and septic arthritis for which she is now on chronic suppressive antibiotics. I have spoken with ID as well as Dr. Delton See today. Medical therapy options remain for her cardiomyopathy and in the context of unknown risks of recurrent infection and bacteremia it seems  like this would be the preferred route. Dr. Algis Liming from ID will be looking into recurrent infection risks. We then may have to try to extrapolate risk of recurrent bacteremia so as to best be able to advise best as to her risks of recurrent device implantation  11/07/2016 - the patient states that she has no energy and sometimes she does not feel like getting up however she pushes herself to go through the day.  She has been noticing skipped beats and palpitations but no dizziness or syncope.  She uses Lasix only as needed and has mild residual edema in her right lower extremity where her knee infection is.  She has no orthopnea or paroxysmal nocturnal dyspnea.  11/22/2016 - this 2 week follow-up, the patient states that she feels like she has slightly more energy with Entresto, for example she started to cook again however she's been experiencing retrosternal chest pains associated palpitations that would last 2-3 minutes then resolve as well as retrosternal chest pressure. She hasn't tried nitroglycerin. She has been experiencing gastric reflux as well. She has been experiencing more frequent palpitations that are associated with dizziness but no syncope. She denies any lower extremity edema orthopnea or proximal nocturnal dyspnea. She has also been complaining of cough productive of yellow sputum for about a week, no fever or chills.  12/12/2016, patient is coming after 3 weeks, she was started on Entresto at the last visit she states that she has noticed mild energy level improvement however still much worse than previously when she had biventricular pacemaker. She was able to [facility] lawn on a 10 acre land and currently gets short of breath while walking from room to room. She has noticed two-pillow orthopnea at night and no significant lower extremity edema. She continues to have palpitations associated with chest pressure but no syncope or falls.   DEVICE information: Biotronik CRT-D, implanted 07/04/14,  Dr. Graciela Husbands EXTRACTED 2/2 MSSA bacteremia 08/16/16, Dr. Gary Fleet at [facility]  Past Medical History:  Diagnosis Date  . 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 Arthritis    "finger joints" (06/21/2016)  . Asthma   . CHF (congestive heart failure) (HCC)   . Congestive dilated cardiomyopathy (HCC) 03/31/2014   a. s/p Biotronik CRTD 06/2014  . GERD (gastroesophageal reflux disease)   . History of blood transfusion 1990s   "related to hysterectomy"  . Hypothyroidism   . Left bundle branch block   . MSSA bacteremia 06/21/2016  . Myocardial infarction (HCC)   . Pancreatitis, acute     Past Surgical History:  Procedure Laterality Date  . ABDOMINAL HYSTERECTOMY    . APPENDECTOMY    . CARDIAC CATHETERIZATION    . CARDIAC DEFIBRILLATOR PLACEMENT  ?2016  . CATARACT EXTRACTION W/ INTRAOCULAR LENS  IMPLANT, BILATERAL Bilateral   . CHOLECYSTECTOMY OPEN     "related to pancreatitis"  . DILATION AND CURETTAGE OF UTERUS    . EP IMPLANTABLE DEVICE N/A 07/04/2014   Procedure: BiV ICD Insertion CRT-D;  Surgeon: Duke Salvia, MD;  Location:  [facility] INVASIVE CV LAB;  Service: Cardiovascular;  Laterality: N/A;  . HERNIA REPAIR    . I&D KNEE WITH POLY EXCHANGE Right 08/11/2016   Procedure: IRRIGATION AND DEBRIDEMENT KNEE WITH POLY EXCHANGE;  Surgeon: Samson Frederic, MD;  Location: [facility] OR;  Service: Orthopedics;  Laterality: Right;  . INSERT / REPLACE / REMOVE PACEMAKER  ?2016  . JOINT REPLACEMENT Bilateral    "thumb joints; used my own material"  . JOINT REPLACEMENT    . LEFT HEART CATHETERIZATION WITH CORONARY ANGIOGRAM N/A 11/02/2013   Procedure: LEFT HEART CATHETERIZATION WITH CORONARY ANGIOGRAM;  Surgeon: Corky Crafts, MD;  Location: [facility] CATH LAB;  Service: Cardiovascular;  Laterality: N/A;  . LOOP RECORDER IMPLANT N/A 04/13/2014   Procedure: LOOP RECORDER IMPLANT;  Surgeon: Duke Salvia, MD;  Location: [facility] CATH  LAB;  Service: Cardiovascular;  Laterality: N/A;  . LOOP RECORDER REMOVAL  ?2016  . POSTERIOR LUMBAR FUSION  05/03/2016   Lumbar Two-Three, Lumbar Three-Four Posterior Lumbar Fusion withLaminectomy and Foraminotomy  . SPINE SURGERY  04/2016   lumbar   . TEE WITHOUT CARDIOVERSION N/A 08/14/2016   Procedure: TRANSESOPHAGEAL ECHOCARDIOGRAM (TEE);  Surgeon: Lars Masson, MD;  Location: [facility] ENDOSCOPY;  Service: Cardiovascular;  Laterality: N/A;  . TONSILLECTOMY    . TOTAL HIP ARTHROPLASTY Bilateral   . UMBILICAL HERNIA REPAIR  1970s?    Current Outpatient Medications  Medication Sig Dispense Refill  . albuterol (PROAIR HFA) 108 (90 Base) MCG/ACT inhaler Inhale 2 puffs into the lungs every 6 (six) hours as needed for wheezing or shortness of breath.    [name] alum & mag hydroxide-simeth (MAALOX/MYLANTA) 200-200-20 MG/5ML suspension Take 30 mLs by mouth every 4 (four) hours as needed for indigestion. 355 mL 0  . aspirin EC 81 MG tablet Take 81 mg by mouth daily.    [name] aspirin-acetaminophen-caffeine (EXCEDRIN MIGRAINE) 250-250-65 MG per tablet Take 1-2 tablets by mouth every 6 (six) hours as needed for headache or migraine.     [name] augmented betamethasone dipropionate (DIPROLENE-AF) 0.05 % ointment Apply 1 application topically daily as needed (acrodermatitis [place] on fingers/toes).     . Calcium Carbonate Antacid (TUMS PO) Take 2 tablets by mouth daily as needed (indigestion).    . carvedilol (COREG) 12.5 MG tablet Take 1.5 tablets (18.75 mg total) 2 (two) times daily by mouth. 180 tablet 3  . cephALEXin (KEFLEX) 500 MG capsule Take 1 capsule (500 mg total) by mouth 4 (four) times daily. 120 capsule 11  . diphenhydrAMINE (BENADRYL) 12.5 MG/5ML elixir Take 5-10 mLs (12.5-25 mg total) by mouth every 4 (four) hours as needed for itching. 120 mL 0  . docusate sodium (COLACE) 100 MG capsule Take 1 capsule (100 mg total) by mouth 2 (two) times daily. 10 capsule 0  . feeding supplement, ENSURE ENLIVE,  (ENSURE ENLIVE) LIQD Take 237 mLs by mouth 2 (two) times daily between meals. 237 mL 12  . furosemide (LASIX) 20 MG tablet TAKE 1 TABLET DAILY AS NEEDED FOR EDEMA 90 tablet 2  . levothyroxine (SYNTHROID, LEVOTHROID) 112 MCG tablet Take 112 mcg by mouth daily before breakfast.    . Melatonin 5 MG TABS Take 15 mg by mouth at bedtime as needed (for sleep.).     [name] menthol-cetylpyridinium (CEPACOL) 3 MG lozenge Take 1 lozenge (3 mg total) by mouth as needed for sore throat (sore throat). 100 tablet 12  . methocarbamol (ROBAXIN) 500 MG tablet Take 1 tablet (500 mg total) by mouth every 6 (six) hours as needed  for muscle spasms. 10 tablet 0  . methylphenidate (RITALIN) 20 MG tablet Take 20 mg by mouth 2 (two) times daily as needed (narcolepsy).     [name]. metoCLOPramide (REGLAN) 5 MG tablet Take 1-2 tablets (5-10 mg total) by mouth every 8 (eight) hours as needed for nausea (if ondansetron (ZOFRAN) ineffective.). 10 tablet 0  . Naphazoline-Pheniramine (ALLERGY EYE OP) Place 1-2 drops into both eyes daily as needed (for irritated eyes).    . nitroGLYCERIN (NITROSTAT) 0.4 MG SL tablet Place 1 tablet (0.4 mg total) every 5 (five) minutes as needed under the tongue for chest pain. 90 tablet 3  . ondansetron (ZOFRAN) 4 MG tablet Take 1 tablet (4 mg total) by mouth every 6 (six) hours as needed for nausea. 20 tablet 0  . polyethylene glycol (MIRALAX / GLYCOLAX) packet Take 17 g by mouth daily as needed for mild constipation. 14 each 0  . Probiotic Product (PROBIOTIC PO) Take 1 capsule by mouth at bedtime.     . rifampin (RIFADIN) 300 MG capsule TAKE 1 CAPSULE(300 MG) BY MOUTH EVERY 12 HOURS 60 capsule 0  . sacubitril-valsartan (ENTRESTO) 24-26 MG Take 1 tablet by mouth 2 (two) times daily. 60 tablet 1  . senna (SENOKOT) 8.6 MG TABS tablet Take 2 tablets (17.2 mg total) by mouth at bedtime. 120 each 0   No current facility-administered medications for this visit.     Allergies:   Sulfa antibiotics   Social  History:  The patient  reports that  has never smoked. she has never used smokeless tobacco. She reports that she drinks alcohol. She reports that she does not use drugs.   Family History:  The patient's family history includes Arrhythmia in her mother; Heart attack in her father, maternal grandfather, and paternal grandfather; Heart disease in her father; Hyperlipidemia in her mother.  ROS:  Please see the history of present illness.  All other systems are reviewed and otherwise negative.   PHYSICAL EXAM:  VS:  LMP  (LMP Unknown)  BMI: There is no height or weight on file to calculate BMI. Well nourished, well developed, in no acute distress  HEENT: normocephalic, atraumatic  Neck: no JVD, carotid bruits or masses Cardiac: RRR ; no significant murmurs, no rubs, or gallops Lungs: CTA b/l, no wheezing, rhonchi or rales , eschar in the left upper thoracic low post CRT-D explantation with no increase warmth or erythema, mild tenderness no discharge. Abd: soft, nontender MS: no deformity or atrophy Ext: trace-1+no edema in the right lower extremity, increased warmth around the knee joint with persistent mild swelling. Right upper extremity has a PICC line that doesn't seem to be infected. Skin: warm and dry, no rash Neuro:  No gross deficits appreciated Psych: euthymic mood, full affect  ICD extraction site: dressing/steri strips were removed well healed.  No erythema, edema, no increased heat to the tissues, no fluid collection or tenderness.   EKG:  Done today and reviewed by myself is SR, LBBB, QRS 156ms  08/14/16: TEE Study Conclusions - Left ventricle: Systolic function was normal. The estimated   ejection fraction was in the range of 55% to 60%. Wall motion was   normal; there were no regional wall motion abnormalities. - Aortic valve: No evidence of vegetation. There was mild   regurgitation. - Aorta: There was mild non-mobile atheroma. - Mitral valve: There was mild  regurgitation. - Left atrium: No evidence of thrombus in the atrial cavity or   appendage. No evidence of thrombus in  the atrial cavity or   appendage. - Right atrium: No evidence of thrombus in the atrial cavity or   appendage. No evidence of thrombus in the atrial cavity or   appendage. - Tricuspid valve: No evidence of vegetation. There was mild   regurgitation. - Pulmonic valve: No evidence of vegetation. Impressions: - There is a highly mobile echodensity measuring 13 x 3 mm attached   to the right atrial lead in the right atrium at the tricuspid   valve level. This is consistent with pacemaker endocarditis.   10/17/15: limited echo for AV optimization Impressions: - The study is performed for AV optimization   There is grade 1 diastolic dysfunction   The optimal AV delay seems to be around 90-100 ms but i would   defer to the doctors managing the pacer.  02/01/15: TTE Study Conclusions - Left ventricle: The cavity size was normal. There was moderate   concentric hypertrophy. Systolic function was normal. The   estimated ejection fraction was in the range of 50% to 55%. Wall   motion was normal; there were no regional wall motion   abnormalities. Doppler parameters are consistent with abnormal   left ventricular relaxation (grade 1 diastolic dysfunction). The   E/e&' ratio is >15, suggesting elevated LV filling pressure. - Mitral valve: Calcified annulus. Mildly thickened leaflets .   There was trivial regurgitation. - Left atrium: The atrium was normal in size. - Right ventricle: The cavity size was normal. Wall thickness was   normal. Pacer wire or catheter noted in right ventricle. Systolic   function was normal. - Right atrium: The atrium was normal in size. Pacer wire or   catheter noted in right atrium. - Tricuspid valve: There was moderate regurgitation. - Pulmonary arteries: PA peak pressure: 30 mm Hg (S). - Inferior vena cava: The vessel was normal in size.  The   respirophasic diameter changes were in the normal range (= 50%),   consistent with normal central venous pressure. Impressions: - Compared to a prior study in 09/2014, the EF has improved to   50-55%.  Recent Labs: 06/22/2016: Magnesium 1.9 08/12/2016: TSH 0.995 11/25/2016: ALT 8; BUN 25; Creat 1.02; Hemoglobin 11.6; Platelets 173; Potassium 4.9; Sodium 139  No results found for requested labs within last 8760 hours.   CrCl cannot be calculated (Unknown ideal weight.).   Wt Readings from Last 3 Encounters:  11/25/16 155 lb (70.3 kg)  11/22/16 151 lb (68.5 kg)  11/07/16 150 lb (68 kg)     Other studies reviewed: Additional studies/records reviewed today include: summarized above  EKG performed today 10/07/2016 shows normal sinus rhythm with left axis deviation and left bundle branch block QRS flanks 168 ms that is prolonged from prior day was 156 ms. This was personally reviewed.   ASSESSMENT AND PLAN:  1. NICM with interval improvement of her EF 35-40% -->50-55%, however CRT-D endocarditis - s/p CRT-D explantation on 08/16/16 --> repeat ECG prolongation of QRS from 122 ms while on by the pacing, post 6 mentation patient back and left bundle branch block with QRS 168 ms at the last visit, LVEF dropped to 30% on the echo on 10/07/2016. - Worsening symptoms of fatigue and shortness of breath back to pre-CRT-D time. -- started on Entresto 26/24 po BID on 11/07/16 that is well tolerated with mild improvement of symptoms however still NYHA III - She is mildly fluid overloaded, I will switch Lasix 20 when necessary Lasix 20 daily and add spironolactone 12.5 mg by  mouth daily. - concern about VTs,carvedilol was increased to 12.75 twice a day, she is currently wearing Holter monitor 48 H, will review once available. - The patient remains in NYHA class III, will contact Dr. Graciela Husbands for consideration of biventricular pacemaker reimplantation.  2. CRT-D - s/p device extraction with recurrent  MSSA bacteremia and vegetation - as above - On antibiotics long-term for total of 6 months and remaining 2 months..  3. HTN - Controlled.  Disposition: Follow up on 12/24/2016 Current medicines are reviewed at length with the patient today.  The patient did not have any concerns regarding medicines.  Signed, Tobias Alexander, MD 12/12/2016 9:21 AM

## 2016-12-13 LAB — BASIC METABOLIC PANEL
BUN/Creatinine Ratio: 21 (ref 12–28)
BUN: 21 mg/dL (ref 8–27)
CO2: 22 mmol/L (ref 20–29)
Calcium: 9.7 mg/dL (ref 8.7–10.3)
Chloride: 103 mmol/L (ref 96–106)
Creatinine, Ser: 1 mg/dL (ref 0.57–1.00)
GFR calc Af Amer: 62 mL/min/{1.73_m2} (ref >59)
GFR calc non Af Amer: 54 mL/min/{1.73_m2} — ABNORMAL LOW (ref >59)
Glucose: 99 mg/dL (ref 65–99)
Potassium: 4.6 mmol/L (ref 3.5–5.2)
Sodium: 142 mmol/L (ref 134–144)

## 2016-12-13 LAB — MAGNESIUM: Magnesium: 1.9 mg/dL (ref 1.6–2.3)

## 2016-12-18 ENCOUNTER — Telehealth: Payer: Self-pay

## 2016-12-18 NOTE — Telephone Encounter (Signed)
-----   Message from Loa Socks, LPN sent at 14/01/299 12:12 PM EST ----- Regarding: PRIOR AUTH ON ENTRESTO  THIS PT IS NOW CONTINUING ENTRESTO 24/26 MG BID PER DR NELSON.  SAMPLES WERE GIVEN AS WELL AS COPAY CARD. SHE REQUESTED THAT WE SENT TO HER [facility] [employer] [facility].  CAN YOU WORK ON GETTING THIS PRIOR AUTHORIZED.  SHE HAS KNOWN HEART FAILURE.   THANKS SO MUCH FOR ALL YOU DO,  IVY

## 2016-12-18 NOTE — Telephone Encounter (Addendum)
**Note De-Identified Gus Littler Obfuscation** Electronic Benefits Verification  Need help?  Call us at [phone]  This prescription does not require a prior authorization ESI has indicated your patient is covered for this prescription.  Return to Dashboard  Estimated Patient Out of Pocket Cost $50.00  Quantity: 60  Days Supply: 30  Actual patient out of pocket cost may vary based on the pharmacy selected for fulfillment.    I did a PA through covermymeds and received the above message. PA for [facility] not needed.

## 2016-12-25 ENCOUNTER — Ambulatory Visit (INDEPENDENT_AMBULATORY_CARE_PROVIDER_SITE_OTHER): Payer: Medicare Other | Admitting: Cardiology

## 2016-12-25 ENCOUNTER — Other Ambulatory Visit: Payer: Self-pay

## 2016-12-25 ENCOUNTER — Encounter: Payer: Self-pay | Admitting: Cardiology

## 2016-12-25 VITALS — BP 122/62 | HR 75 | Resp 16 | Ht <= 58 in | Wt 154.2 lb

## 2016-12-25 DIAGNOSIS — M00061 Staphylococcal arthritis, right knee: Secondary | ICD-10-CM

## 2016-12-25 DIAGNOSIS — I5022 Chronic systolic (congestive) heart failure: Secondary | ICD-10-CM

## 2016-12-25 DIAGNOSIS — Z95810 Presence of automatic (implantable) cardiac defibrillator: Secondary | ICD-10-CM

## 2016-12-25 DIAGNOSIS — I428 Other cardiomyopathies: Secondary | ICD-10-CM | POA: Diagnosis not present

## 2016-12-25 DIAGNOSIS — R7881 Bacteremia: Secondary | ICD-10-CM | POA: Diagnosis not present

## 2016-12-25 DIAGNOSIS — Z01812 Encounter for preprocedural laboratory examination: Secondary | ICD-10-CM

## 2016-12-25 DIAGNOSIS — I201 Angina pectoris with documented spasm: Secondary | ICD-10-CM

## 2016-12-25 DIAGNOSIS — I10 Essential (primary) hypertension: Secondary | ICD-10-CM

## 2016-12-25 DIAGNOSIS — B9561 Methicillin susceptible Staphylococcus aureus infection as the cause of diseases classified elsewhere: Secondary | ICD-10-CM

## 2016-12-25 DIAGNOSIS — I420 Dilated cardiomyopathy: Secondary | ICD-10-CM

## 2016-12-25 NOTE — Progress Notes (Signed)
Cardiology Office Note Date:  12/25/2016  Patient ID:  September, Mormile Feb 22, 1935, MRN [id number] PCP:  [name], MD  Cardiologist:  Dr. Delton See Electrophysiologist: Dr. Graciela Husbands   Chief Complaint: Two-week follow-up, nonischemic cardiomyopathy, CHF, status post explantation of infected biventricular ICD.  History of Present Illness: JUDA LAJEUNESSE is a 81 y.o. female with history of NICM w/hx of CRT-D though extracted 2/2 recurrent MSSA bacteremia,  asthma, LBBB, chronic CHF (systolic), hypothyroidism.  She was admitted to [facility] initially 6/7-18 with MSSA bacteremia of uncertain etiology, during this time severe sepsis and had 2/2 blood cultures positive on 6/7.  She had an LP and a CT chest that suggested RML and lingular infiltrates.  She was treated at various times with Vanc, Rocephin, Levaquin, and Doxy and she was eventually changed to Ancef per ID recommendations and completed 4 weeks of therapy.  TTE and TEE were negative.  She "felt great" upon completing her antibiotics on 7/5.    She had a bilateral TKR 14 years ago and on Thursday (8/2) noticed R knee stiffness/pain.  On Friday (8/3) it was swollen and hot, presented to [facility] 08/11/16 with acutely increased swelling and pain, in the ER Grossly purulent fluid on right knee arthrocentesis, admitted with septic arthritis and underwent I&D of R knee with ply exchange.  TEE this admission was + for vegetation on RA lead, and transferred to [facility] for device extraction (given Dr. Ladona Ridgel was away).  She underwent extraction of her device by Dr. Gary Fleet on 08/16/16.  08/29/2016  - She comes today accompanied by her husband.  She is doing pretty well, ambulating with a walker, remains with PICC line and saw ID earlier this week, reports being told another 4 weeks of IV antibiotics and likely will need lifelong oral antibiotics afterwards.  She denies any kind of CP, no palpitations or SOB, no dizziness, near syncope or syncope.  She  did not want to have follow up for wound care/check at Dr. Shon Baton, wanted to come back here.  She feels like she is making slow steady progress, no fever or symptoms of illness.  09/24/2016 - 3 weeks follow-up, the patient feels and looks significantly better. She finally Harris color in her cheeks and her energy level has improved significantly. She denies any chest pain or shortness of breath, she has persistent swelling and pain in her right knee that was operated on on 08/11/2016. Her right lower extremity has mild lower extremity edema but not the left lower extremity. She has no orthopnea or personal nocturnal dyspnea no dizziness palpitation or syncope. She has mild tenderness at night in her left upper chest wall where her biventricular ICD was explanted. She continues to use daily IV antibiotics, currently completing 6 weeks course and is following up with ID tomorrow. She might require lifelong antibiotics, hopefully PO.  10/07/2016 - the patient is coming after 2 weeks, she continues with the antibiotics that were now switch to by mouth, she is using Keflex and rifampin. She is to follow with ID in 2 weeks. Since the last visit she has not based progressive worsening shortness of breath and lack of energy, she is able to do activities of daily living such as cooking but gets profoundly tired and has to take several breaks. She denies any lower extremity edema orthopnea or proximal nocturnal dyspnea. She feels PVCs no longer palpitations dizziness or falls. She walks with a walker.  11/07/2016 - 1 month follow up, she was  seen by Dr Graciela Husbands who states: The patient has worsening cardiomyopathy following device extraction. This unfortunately occurred in the context of recurrent bacteremia and septic arthritis for which she is now on chronic suppressive antibiotics. I have spoken with ID as well as Dr. Delton See today. Medical therapy options remain for her cardiomyopathy and in the context of unknown  risks of recurrent infection and bacteremia it seems like this would be the preferred route. Dr. Algis Liming from ID will be looking into recurrent infection risks. We then may have to try to extrapolate risk of recurrent bacteremia so as to best be able to advise best as to her risks of recurrent device implantation  11/07/2016 - the patient states that she has no energy and sometimes she does not feel like getting up however she pushes herself to go through the day.  She has been noticing skipped beats and palpitations but no dizziness or syncope.  She uses Lasix only as needed and has mild residual edema in her right lower extremity where her knee infection is.  She has no orthopnea or paroxysmal nocturnal dyspnea.  11/22/2016 - this 2 week follow-up, the patient states that she feels like she has slightly more energy with Entresto, for example she started to cook again however she's been experiencing retrosternal chest pains associated palpitations that would last 2-3 minutes then resolve as well as retrosternal chest pressure. She hasn't tried nitroglycerin. She has been experiencing gastric reflux as well. She has been experiencing more frequent palpitations that are associated with dizziness but no syncope. She denies any lower extremity edema orthopnea or proximal nocturnal dyspnea. She has also been complaining of cough productive of yellow sputum for about a week, no fever or chills.  12/12/2016, patient is coming after 3 weeks, she was started on Entresto at the last visit she states that she has noticed mild energy level improvement however still much worse than previously when she had biventricular pacemaker. She was able to [facility] lawn on a 10 acre land and currently gets short of breath while walking from room to room. She has noticed two-pillow orthopnea at night and no significant lower extremity edema. She continues to have palpitations associated with chest pressure but no syncope or  falls.  12/25/2016 - the patient is coming after 2 weeks, she denies any lower extremity edema she is wearing compression stockings, she feels like she is slightly more energy and was able to do some cooking and baking. However she has been experiencing more frequent proximal nocturnal dyspnea. She still gets short of breath with mild exertion and denies any chest pain. She is tolerating her medications well and can see a difference with initiation of Entresto.  DEVICE information: Biotronik CRT-D, implanted 07/04/14, Dr. Graciela Husbands EXTRACTED 2/2 MSSA bacteremia 08/16/16, Dr. Gary Fleet at [facility]  Past Medical History:  Diagnosis Date  . 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 Arthritis    "finger joints" (06/21/2016)  . Asthma   . CHF (congestive heart failure) (HCC)   . Congestive dilated cardiomyopathy (HCC) 03/31/2014   a. s/p Biotronik CRTD 06/2014  . GERD (gastroesophageal reflux disease)   . History of blood transfusion 1990s   "related to hysterectomy"  . Hypothyroidism   . Left bundle branch block   . MSSA bacteremia 06/21/2016  . Myocardial infarction (HCC)   . Pancreatitis, acute     Past Surgical History:  Procedure Laterality Date  . ABDOMINAL HYSTERECTOMY    . APPENDECTOMY    . CARDIAC CATHETERIZATION    . CARDIAC DEFIBRILLATOR PLACEMENT  ?2016  . CATARACT EXTRACTION W/ INTRAOCULAR LENS  IMPLANT, BILATERAL Bilateral   . CHOLECYSTECTOMY OPEN     "related to pancreatitis"  . DILATION AND CURETTAGE OF UTERUS    . EP IMPLANTABLE DEVICE N/A 07/04/2014   Procedure: BiV ICD Insertion CRT-D;  Surgeon: Duke SalviaSteven C Klein, MD;  [facility];  Service: Cardiovascular;  Laterality: N/A;  . HERNIA REPAIR    . I&D KNEE WITH POLY EXCHANGE Right 08/11/2016   Procedure: IRRIGATION AND DEBRIDEMENT KNEE WITH POLY EXCHANGE;  Surgeon: Samson FredericSwinteck, Brian, MD;  Location: [facility] OR;  Service: Orthopedics;   Laterality: Right;  . INSERT / REPLACE / REMOVE PACEMAKER  ?2016  . JOINT REPLACEMENT Bilateral    "thumb joints; used my own material"  . JOINT REPLACEMENT    . LEFT HEART CATHETERIZATION WITH CORONARY ANGIOGRAM N/A 11/02/2013   Procedure: LEFT HEART CATHETERIZATION WITH CORONARY ANGIOGRAM;  Surgeon: Corky CraftsJayadeep S Varanasi, MD;  [facility];  Service: Cardiovascular;  Laterality: N/A;  . LOOP RECORDER IMPLANT N/A 04/13/2014   Procedure: LOOP RECORDER IMPLANT;  Surgeon: Duke SalviaSteven C Klein, MD;  [facility];  Service: Cardiovascular;  Laterality: N/A;  . LOOP RECORDER REMOVAL  ?2016  . POSTERIOR LUMBAR FUSION  05/03/2016   Lumbar Two-Three, Lumbar Three-Four Posterior Lumbar Fusion withLaminectomy and Foraminotomy  . SPINE SURGERY  04/2016   lumbar   . TEE WITHOUT CARDIOVERSION N/A 08/14/2016   Procedure: TRANSESOPHAGEAL ECHOCARDIOGRAM (TEE);  Surgeon: Lars MassonNelson, Deana Krock H, MD;  [facility];  Service: Cardiovascular;  Laterality: N/A;  . TONSILLECTOMY    . TOTAL HIP ARTHROPLASTY Bilateral   . UMBILICAL HERNIA REPAIR  1970s?    Current Outpatient Medications  Medication Sig Dispense Refill  . albuterol (PROAIR HFA) 108 (90 Base) MCG/ACT inhaler Inhale 2 puffs into the lungs every 6 (six) hours as needed for wheezing or shortness of breath.    [name]. alum & mag hydroxide-simeth (MAALOX/MYLANTA) 200-200-20 MG/5ML suspension Take 30 mLs by mouth every 4 (four) hours as needed for indigestion. 355 mL 0  . aspirin EC 81 MG tablet Take 81 mg by mouth daily.    [name]. aspirin-acetaminophen-caffeine (EXCEDRIN MIGRAINE) 250-250-65 MG per tablet Take 1-2 tablets by mouth every 6 (six) hours as needed for headache or migraine.     [name]. augmented betamethasone dipropionate (DIPROLENE-AF) 0.05 % ointment Apply 1 application topically daily as needed (acrodermatitis [place] on fingers/toes).     . Calcium Carbonate Antacid (TUMS PO) Take 2 tablets by mouth daily as needed (indigestion).    .  carvedilol (COREG) 12.5 MG tablet Take 1.5 tablets (18.75 mg total) 2 (two) times daily by mouth. 180 tablet 3  . cephALEXin (KEFLEX) 500 MG capsule Take 1 capsule (500 mg total) by mouth 4 (four) times daily. 120 capsule 11  . diphenhydrAMINE (BENADRYL) 12.5 MG/5ML elixir Take 5-10 mLs (12.5-25 mg total) by mouth every 4 (four) hours as needed for itching. 120 mL 0  . docusate sodium (COLACE) 100 MG capsule Take 1 capsule (100 mg total) by mouth 2 (two) times daily. 10 capsule 0  . feeding supplement, ENSURE ENLIVE, (ENSURE ENLIVE) LIQD Take 237 mLs by mouth 2 (two) times daily between meals. 237 mL 12  . furosemide (LASIX) 20 MG tablet Take 1 tablet (20 mg total) by mouth daily. 90 tablet 3  .  levothyroxine (SYNTHROID, LEVOTHROID) 112 MCG tablet Take 112 mcg by mouth daily before breakfast.    . Melatonin 5 MG TABS Take 15 mg by mouth at bedtime as needed (for sleep.).     [name] menthol-cetylpyridinium (CEPACOL) 3 MG lozenge Take 1 lozenge (3 mg total) by mouth as needed for sore throat (sore throat). 100 tablet 12  . methocarbamol (ROBAXIN) 500 MG tablet Take 1 tablet (500 mg total) by mouth every 6 (six) hours as needed for muscle spasms. 10 tablet 0  . methylphenidate (RITALIN) 20 MG tablet Take 20 mg by mouth 2 (two) times daily as needed (narcolepsy).     [name] metoCLOPramide (REGLAN) 5 MG tablet Take 1-2 tablets (5-10 mg total) by mouth every 8 (eight) hours as needed for nausea (if ondansetron (ZOFRAN) ineffective.). 10 tablet 0  . Naphazoline-Pheniramine (ALLERGY EYE OP) Place 1-2 drops into both eyes daily as needed (for irritated eyes).    . nitroGLYCERIN (NITROSTAT) 0.4 MG SL tablet Place 1 tablet (0.4 mg total) every 5 (five) minutes as needed under the tongue for chest pain. 90 tablet 3  . ondansetron (ZOFRAN) 4 MG tablet Take 1 tablet (4 mg total) by mouth every 6 (six) hours as needed for nausea. 20 tablet 0  . polyethylene glycol (MIRALAX / GLYCOLAX) packet Take 17 g by mouth daily as needed  for mild constipation. 14 each 0  . Probiotic Product (PROBIOTIC PO) Take 1 capsule by mouth at bedtime.     . rifampin (RIFADIN) 300 MG capsule TAKE 1 CAPSULE(300 MG) BY MOUTH EVERY 12 HOURS 60 capsule 0  . sacubitril-valsartan (ENTRESTO) 24-26 MG Take 1 tablet by mouth 2 (two) times daily. 180 tablet 2  . senna (SENOKOT) 8.6 MG TABS tablet Take 2 tablets (17.2 mg total) by mouth at bedtime. 120 each 0  . spironolactone (ALDACTONE) 25 MG tablet Take 0.5 tablets (12.5 mg total) by mouth daily. 90 tablet 3   No current facility-administered medications for this visit.     Allergies:   Sulfa antibiotics   Social History:  The patient  reports that  has never smoked. she has never used smokeless tobacco. She reports that she drinks alcohol. She reports that she does not use drugs.   Family History:  The patient's family history includes Arrhythmia in her mother; Heart attack in her father, maternal grandfather, and paternal grandfather; Heart disease in her father; Hyperlipidemia in her mother.  ROS:  Please see the history of present illness.  All other systems are reviewed and otherwise negative.   PHYSICAL EXAM:  VS:  BP 122/62   Pulse 75   Resp 16   Ht 4\' 10"  (1.473 m)   Wt 154 lb 3.2 oz (69.9 kg)   LMP  (LMP Unknown)   SpO2 98%   BMI 32.23 kg/m  BMI: Body mass index is 32.23 kg/m. Well nourished, well developed, in no acute distress  HEENT: normocephalic, atraumatic  Neck: no JVD, carotid bruits or masses Cardiac: RRR ; no significant murmurs, no rubs, or gallops Lungs: CTA b/l, no wheezing, rhonchi or rales , eschar in the left upper thoracic low post CRT-D explantation with no increase warmth or erythema, mild tenderness no discharge. Abd: soft, nontender MS: no deformity or atrophy Ext: trace-1+no edema in the right lower extremity, increased warmth around the knee joint with persistent mild swelling. Right upper extremity has a PICC line that doesn't seem to be  infected. Skin: warm and dry, no rash Neuro:  No gross  deficits appreciated Psych: euthymic mood, full affect  ICD extraction site: dressing/steri strips were removed well healed.  No erythema, edema, no increased heat to the tissues, no fluid collection or tenderness.   EKG:  Done today and reviewed by myself is SR, LBBB, QRS  08/14/16: TEE Study Conclusions - Left ventricle: Systolic function was normal. The estimated   ejection fraction was in the range of 55% to 60%. Wall motion was   normal; there were no regional wall motion abnormalities. - Aortic valve: No evidence of vegetation. There was mild   regurgitation. - Aorta: There was mild non-mobile atheroma. - Mitral valve: There was mild regurgitation. - Left atrium: No evidence of thrombus in the atrial cavity or   appendage. No evidence of thrombus in the atrial cavity or   appendage. - Right atrium: No evidence of thrombus in the atrial cavity or   appendage. No evidence of thrombus in the atrial cavity or   appendage. - Tricuspid valve: No evidence of vegetation. There was mild   regurgitation. - Pulmonic valve: No evidence of vegetation. Impressions: - There is a highly mobile echodensity measuring 13 x 3 mm attached   to the right atrial lead in the right atrium at the tricuspid   valve level. This is consistent with pacemaker endocarditis.   10/17/15: limited echo for AV optimization Impressions: - The study is performed for AV optimization   There is grade 1 diastolic dysfunction   The optimal AV delay seems to be around 90-100 ms but i would   defer to the doctors managing the pacer.  02/01/15: TTE Study Conclusions - Left ventricle: The cavity size was normal. There was moderate   concentric hypertrophy. Systolic function was normal. The   estimated ejection fraction was in the range of 50% to 55%. Wall   motion was normal; there were no regional wall motion   abnormalities. Doppler parameters are  consistent with abnormal   left ventricular relaxation (grade 1 diastolic dysfunction). The   E/e&' ratio is >15, suggesting elevated LV filling pressure. - Mitral valve: Calcified annulus. Mildly thickened leaflets .   There was trivial regurgitation. - Left atrium: The atrium was normal in size. - Right ventricle: The cavity size was normal. Wall thickness was   normal. Pacer wire or catheter noted in right ventricle. Systolic   function was normal. - Right atrium: The atrium was normal in size. Pacer wire or   catheter noted in right atrium. - Tricuspid valve: There was moderate regurgitation. - Pulmonary arteries: PA peak pressure: 30 mm Hg (S). - Inferior vena cava: The vessel was normal in size. The   respirophasic diameter changes were in the normal range (= 50%),   consistent with normal central venous pressure. Impressions: - Compared to a prior study in 09/2014, the EF has improved to   50-55%.  Recent Labs: 08/12/2016: TSH 0.995 11/25/2016: ALT 8; Hemoglobin 11.6; Platelets 173 12/12/2016: BUN 21; Creatinine, Ser 1.00; Magnesium 1.9; Potassium 4.6; Sodium 142  No results found for requested labs within last 8760 hours.   Estimated Creatinine Clearance: 37.8 mL/min (by C-G formula based on SCr of 1 mg/dL).   Wt Readings from Last 3 Encounters:  12/25/16 154 lb 3.2 oz (69.9 kg)  12/12/16 155 lb (70.3 kg)  11/25/16 155 lb (70.3 kg)    Other studies reviewed: Additional studies/records reviewed today include: summarized above  EKG performed on 10/07/2016 shows normal sinus rhythm with left axis deviation and left bundle  branch block QRS flanks 168 ms that is prolonged from prior day was 156 ms. This was personally reviewed.    ASSESSMENT AND PLAN:  1. NICM with interval improvement of her EF 35-40% -->50-55%, however CRT-D endocarditis - s/p CRT-D explantation on 08/16/16 --> repeat ECG prolongation of QRS from 122 ms while on by the pacing, post 6 mentation patient back  and left bundle branch block with QRS 168 ms at the last visit, LVEF dropped to 30% on the echo on 10/07/2016. - Worsening symptoms of fatigue and shortness of breath back to pre-CRT-D time. NYHA III. -- started on Entresto 26/24 po BID on 11/07/16 and Spironolactone with mild improvement of symptoms however still NYHA III - She is euvolemic, her symptoms are truly low flow/low output CHF - concern about VTs,carvedilol was increased to 12.75 twice a day - The patient remains in NYHA class III, Dr. Graciela Husbands will plan for an BiV ICD re-implantation in th enext 30 days  2. CRT-D - s/p device extraction with recurrent MSSA bacteremia and vegetation - as above - On antibiotics long-term for total of 6 months and remaining 2 months. Followed by Dr Synthia Innocent  3. HTN - Controlled.  Disposition: Follow up in 4 weeks. Current medicines are reviewed at length with the patient today.  The patient did not have any concerns regarding medicines.  Signed, Tobias Alexander, MD 12/25/2016 12:04 PM

## 2016-12-25 NOTE — Patient Instructions (Signed)
Medication Instructions:   Your physician recommends that you continue on your current medications as directed. Please refer to the Current Medication list given to you today.    Follow-Up:  1 MONTH WITH DR NELSON---PLEASE ADD THE PATIENT INTO ONE OF HER QUARTER DAY END SLOTS       If you need a refill on your cardiac medications before your next appointment, please call your pharmacy.

## 2017-01-07 HISTORY — PX: REPLACEMENT TOTAL KNEE: SUR1224

## 2017-01-08 ENCOUNTER — Other Ambulatory Visit: Payer: Medicare Other | Admitting: *Deleted

## 2017-01-08 DIAGNOSIS — I5022 Chronic systolic (congestive) heart failure: Secondary | ICD-10-CM

## 2017-01-08 DIAGNOSIS — I420 Dilated cardiomyopathy: Secondary | ICD-10-CM

## 2017-01-08 DIAGNOSIS — Z01812 Encounter for preprocedural laboratory examination: Secondary | ICD-10-CM

## 2017-01-09 LAB — CBC WITH DIFFERENTIAL/PLATELET
BASOS ABS: 0 10*3/uL (ref 0.0–0.2)
BASOS: 0 %
EOS (ABSOLUTE): 0.6 10*3/uL — ABNORMAL HIGH (ref 0.0–0.4)
EOS: 11 %
HEMATOCRIT: 36.4 % (ref 34.0–46.6)
Hemoglobin: 12 g/dL (ref 11.1–15.9)
IMMATURE GRANS (ABS): 0 10*3/uL (ref 0.0–0.1)
Immature Granulocytes: 0 %
LYMPHS: 20 %
Lymphocytes Absolute: 1.1 10*3/uL (ref 0.7–3.1)
MCH: 31.1 pg (ref 26.6–33.0)
MCHC: 33 g/dL (ref 31.5–35.7)
MCV: 94 fL (ref 79–97)
MONOCYTES: 6 %
Monocytes Absolute: 0.4 10*3/uL (ref 0.1–0.9)
NEUTROS ABS: 3.7 10*3/uL (ref 1.4–7.0)
Neutrophils: 63 %
Platelets: 179 10*3/uL (ref 150–379)
RBC: 3.86 x10E6/uL (ref 3.77–5.28)
RDW: 16.2 % — ABNORMAL HIGH (ref 12.3–15.4)
WBC: 5.9 10*3/uL (ref 3.4–10.8)

## 2017-01-09 LAB — BASIC METABOLIC PANEL
BUN/Creatinine Ratio: 35 — ABNORMAL HIGH (ref 12–28)
BUN: 34 mg/dL — AB (ref 8–27)
CALCIUM: 8.7 mg/dL (ref 8.7–10.3)
CHLORIDE: 107 mmol/L — AB (ref 96–106)
CO2: 20 mmol/L (ref 20–29)
CREATININE: 0.97 mg/dL (ref 0.57–1.00)
GFR, EST AFRICAN AMERICAN: 64 mL/min/{1.73_m2} (ref >59)
GFR, EST NON AFRICAN AMERICAN: 56 mL/min/{1.73_m2} — AB (ref >59)
Glucose: 89 mg/dL (ref 65–99)
Potassium: 4.4 mmol/L (ref 3.5–5.2)
Sodium: 142 mmol/L (ref 134–144)

## 2017-01-17 ENCOUNTER — Encounter (HOSPITAL_COMMUNITY)
Admission: RE | Disposition: A | Discharge status: Home / Self Care | Payer: Self-pay | Source: Ambulatory Visit | Attending: Internal Medicine

## 2017-01-17 ENCOUNTER — Ambulatory Visit (HOSPITAL_COMMUNITY)
Admission: RE | Admit: 2017-01-17 | Discharge: 2017-01-18 | Disposition: A | Discharge status: Home / Self Care | Payer: Medicare Other | Source: Ambulatory Visit | Attending: Internal Medicine | Admitting: Internal Medicine

## 2017-01-17 ENCOUNTER — Encounter (HOSPITAL_COMMUNITY): Payer: Self-pay

## 2017-01-17 DIAGNOSIS — E039 Hypothyroidism, unspecified: Secondary | ICD-10-CM | POA: Insufficient documentation

## 2017-01-17 DIAGNOSIS — Z79899 Other long term (current) drug therapy: Secondary | ICD-10-CM | POA: Insufficient documentation

## 2017-01-17 DIAGNOSIS — Z8619 Personal history of other infectious and parasitic diseases: Secondary | ICD-10-CM | POA: Insufficient documentation

## 2017-01-17 DIAGNOSIS — I447 Left bundle-branch block, unspecified: Secondary | ICD-10-CM | POA: Insufficient documentation

## 2017-01-17 DIAGNOSIS — M009 Pyogenic arthritis, unspecified: Secondary | ICD-10-CM | POA: Insufficient documentation

## 2017-01-17 DIAGNOSIS — Z959 Presence of cardiac and vascular implant and graft, unspecified: Secondary | ICD-10-CM

## 2017-01-17 DIAGNOSIS — Z882 Allergy status to sulfonamides status: Secondary | ICD-10-CM | POA: Insufficient documentation

## 2017-01-17 DIAGNOSIS — K219 Gastro-esophageal reflux disease without esophagitis: Secondary | ICD-10-CM | POA: Insufficient documentation

## 2017-01-17 DIAGNOSIS — I428 Other cardiomyopathies: Secondary | ICD-10-CM | POA: Insufficient documentation

## 2017-01-17 DIAGNOSIS — I252 Old myocardial infarction: Secondary | ICD-10-CM | POA: Insufficient documentation

## 2017-01-17 DIAGNOSIS — T827XXA Infection and inflammatory reaction due to other cardiac and vascular devices, implants and grafts, initial encounter: Secondary | ICD-10-CM | POA: Diagnosis not present

## 2017-01-17 DIAGNOSIS — I5022 Chronic systolic (congestive) heart failure: Secondary | ICD-10-CM | POA: Diagnosis not present

## 2017-01-17 DIAGNOSIS — Z7982 Long term (current) use of aspirin: Secondary | ICD-10-CM | POA: Insufficient documentation

## 2017-01-17 DIAGNOSIS — Z8719 Personal history of other diseases of the digestive system: Secondary | ICD-10-CM | POA: Insufficient documentation

## 2017-01-17 DIAGNOSIS — I472 Ventricular tachycardia: Secondary | ICD-10-CM | POA: Diagnosis not present

## 2017-01-17 HISTORY — PX: BIV PACEMAKER INSERTION CRT-P: EP1199

## 2017-01-17 LAB — SURGICAL PCR SCREEN
MRSA, PCR: NEGATIVE
STAPHYLOCOCCUS AUREUS: NEGATIVE

## 2017-01-17 LAB — GLUCOSE, CAPILLARY: Glucose-Capillary: 106 mg/dL — ABNORMAL HIGH (ref 65–99)

## 2017-01-17 SURGERY — BIV PACEMAKER INSERTION CRT-P

## 2017-01-17 MED ORDER — SODIUM CHLORIDE 0.9 % IR SOLN
Status: AC
  Filled 2017-01-17: qty 2

## 2017-01-17 MED ORDER — HEPARIN (PORCINE) IN NACL 2-0.9 UNIT/ML-% IJ SOLN
INTRAMUSCULAR | Status: AC | PRN
  Administered 2017-01-17: 500 mL

## 2017-01-17 MED ORDER — IOPAMIDOL (ISOVUE-370) INJECTION 76%
INTRAVENOUS | Status: AC
  Filled 2017-01-17: qty 100

## 2017-01-17 MED ORDER — MAGNESIUM OXIDE 400 (241.3 MG) MG PO TABS
200.0000 mg | ORAL_TABLET | Freq: Every day | ORAL | Status: DC
  Administered 2017-01-17 – 2017-01-18 (×2): 200 mg via ORAL
  Filled 2017-01-17 (×2): qty 1

## 2017-01-17 MED ORDER — SPIRONOLACTONE 12.5 MG HALF TABLET
12.5000 mg | ORAL_TABLET | Freq: Every day | ORAL | Status: DC
  Administered 2017-01-17 – 2017-01-18 (×2): 12.5 mg via ORAL
  Filled 2017-01-17 (×2): qty 1

## 2017-01-17 MED ORDER — MIDAZOLAM HCL 5 MG/5ML IJ SOLN
INTRAMUSCULAR | Status: AC
  Filled 2017-01-17: qty 5

## 2017-01-17 MED ORDER — ALBUTEROL SULFATE (2.5 MG/3ML) 0.083% IN NEBU
3.0000 mL | INHALATION_SOLUTION | Freq: Four times a day (QID) | RESPIRATORY_TRACT | Status: DC | PRN
  Administered 2017-01-17: 3 mL via RESPIRATORY_TRACT
  Filled 2017-01-17: qty 3

## 2017-01-17 MED ORDER — LIDOCAINE HCL (PF) 1 % IJ SOLN
INTRAMUSCULAR | Status: AC
  Filled 2017-01-17: qty 30

## 2017-01-17 MED ORDER — METHOCARBAMOL 500 MG PO TABS
500.0000 mg | ORAL_TABLET | Freq: Four times a day (QID) | ORAL | Status: DC | PRN
  Administered 2017-01-17: 500 mg via ORAL
  Filled 2017-01-17: qty 1

## 2017-01-17 MED ORDER — IOPAMIDOL (ISOVUE-370) INJECTION 76%
INTRAVENOUS | Status: AC
  Filled 2017-01-17: qty 50

## 2017-01-17 MED ORDER — CEFAZOLIN SODIUM-DEXTROSE 2-4 GM/100ML-% IV SOLN
INTRAVENOUS | Status: AC
  Filled 2017-01-17: qty 100

## 2017-01-17 MED ORDER — FENTANYL CITRATE (PF) 100 MCG/2ML IJ SOLN
INTRAMUSCULAR | Status: AC
  Filled 2017-01-17: qty 2

## 2017-01-17 MED ORDER — MIDAZOLAM HCL 5 MG/5ML IJ SOLN
INTRAMUSCULAR | Status: DC | PRN
  Administered 2017-01-17 (×4): 1 mg via INTRAVENOUS
  Administered 2017-01-17: 2 mg via INTRAVENOUS
  Administered 2017-01-17: 1 mg via INTRAVENOUS

## 2017-01-17 MED ORDER — ONDANSETRON HCL 4 MG PO TABS: 4.0000 mg | ORAL_TABLET | Freq: Four times a day (QID) | ORAL | Status: DC | PRN

## 2017-01-17 MED ORDER — CEFAZOLIN SODIUM-DEXTROSE 2-4 GM/100ML-% IV SOLN
2.0000 g | INTRAVENOUS | Status: AC
  Administered 2017-01-17: 2 g via INTRAVENOUS

## 2017-01-17 MED ORDER — POLYETHYLENE GLYCOL 3350 17 G PO PACK: 17.0000 g | PACK | Freq: Every day | ORAL | Status: DC | PRN

## 2017-01-17 MED ORDER — CEFAZOLIN SODIUM-DEXTROSE 1-4 GM/50ML-% IV SOLN
1.0000 g | Freq: Four times a day (QID) | INTRAVENOUS | Status: AC
  Administered 2017-01-17 – 2017-01-18 (×3): 1 g via INTRAVENOUS
  Filled 2017-01-17 (×3): qty 50

## 2017-01-17 MED ORDER — MELATONIN 3 MG PO TABS
4.5000 mg | ORAL_TABLET | Freq: Every evening | ORAL | Status: DC | PRN
  Administered 2017-01-17: 4.5 mg via ORAL
  Filled 2017-01-17 (×3): qty 1.5

## 2017-01-17 MED ORDER — BUPIVACAINE HCL (PF) 0.25 % IJ SOLN
INTRAMUSCULAR | Status: DC | PRN
  Administered 2017-01-17: 90 mL

## 2017-01-17 MED ORDER — MUPIROCIN 2 % EX OINT
TOPICAL_OINTMENT | CUTANEOUS | Status: AC
  Administered 2017-01-17: 10:00:00
  Filled 2017-01-17: qty 22

## 2017-01-17 MED ORDER — CARVEDILOL 6.25 MG PO TABS
18.7500 mg | ORAL_TABLET | Freq: Two times a day (BID) | ORAL | Status: DC
  Administered 2017-01-18: 18.75 mg via ORAL
  Filled 2017-01-17: qty 1

## 2017-01-17 MED ORDER — HEPARIN (PORCINE) IN NACL 2-0.9 UNIT/ML-% IJ SOLN
INTRAMUSCULAR | Status: AC
  Filled 2017-01-17: qty 500

## 2017-01-17 MED ORDER — SODIUM CHLORIDE 0.9 % IV SOLN
INTRAVENOUS | Status: DC
  Administered 2017-01-17: 10:00:00 via INTRAVENOUS
  Administered 2017-01-17: 250 mL via INTRAVENOUS

## 2017-01-17 MED ORDER — METHYLPHENIDATE HCL 5 MG PO TABS
20.0000 mg | ORAL_TABLET | Freq: Every day | ORAL | Status: DC | PRN
Start: 2017-01-17 — End: 2017-01-18

## 2017-01-17 MED ORDER — SODIUM CHLORIDE 0.9 % IV SOLN
INTRAVENOUS | Status: DC
  Administered 2017-01-17: 10:00:00 via INTRAVENOUS

## 2017-01-17 MED ORDER — FENTANYL CITRATE (PF) 100 MCG/2ML IJ SOLN
INTRAMUSCULAR | Status: DC | PRN
  Administered 2017-01-17 (×6): 25 ug via INTRAVENOUS

## 2017-01-17 MED ORDER — RIFAMPIN 300 MG PO CAPS
300.0000 mg | ORAL_CAPSULE | Freq: Two times a day (BID) | ORAL | Status: DC
  Administered 2017-01-17 – 2017-01-18 (×2): 300 mg via ORAL
  Filled 2017-01-17 (×3): qty 1

## 2017-01-17 MED ORDER — SACUBITRIL-VALSARTAN 24-26 MG PO TABS
1.0000 | ORAL_TABLET | Freq: Two times a day (BID) | ORAL | Status: DC
  Administered 2017-01-17 – 2017-01-18 (×2): 1 via ORAL
  Filled 2017-01-17 (×3): qty 1

## 2017-01-17 MED ORDER — CHLORHEXIDINE GLUCONATE 4 % EX LIQD: 60.0000 mL | Freq: Once | CUTANEOUS | Status: DC

## 2017-01-17 MED ORDER — LEVOTHYROXINE SODIUM 112 MCG PO TABS
112.0000 ug | ORAL_TABLET | Freq: Every day | ORAL | Status: DC
  Administered 2017-01-18: 112 ug via ORAL
  Filled 2017-01-17: qty 1

## 2017-01-17 MED ORDER — CEPHALEXIN 500 MG PO CAPS
500.0000 mg | ORAL_CAPSULE | Freq: Three times a day (TID) | ORAL | Status: DC
  Administered 2017-01-18: 500 mg via ORAL
  Filled 2017-01-17: qty 1

## 2017-01-17 MED ORDER — SODIUM CHLORIDE 0.9 % IV SOLN: INTRAVENOUS | Status: AC

## 2017-01-17 MED ORDER — ACETAMINOPHEN 325 MG PO TABS
ORAL_TABLET | ORAL | Status: AC
  Filled 2017-01-17: qty 2

## 2017-01-17 MED ORDER — NITROGLYCERIN 0.4 MG SL SUBL: 0.4000 mg | SUBLINGUAL_TABLET | SUBLINGUAL | Status: DC | PRN

## 2017-01-17 MED ORDER — ASPIRIN-ACETAMINOPHEN-CAFFEINE 250-250-65 MG PO TABS
2.0000 | ORAL_TABLET | Freq: Three times a day (TID) | ORAL | Status: DC | PRN
  Filled 2017-01-17: qty 2

## 2017-01-17 MED ORDER — SODIUM CHLORIDE 0.9 % IR SOLN
80.0000 mg | Status: AC
  Administered 2017-01-17: 80 mg

## 2017-01-17 MED ORDER — ONDANSETRON HCL 4 MG/2ML IJ SOLN: 4.0000 mg | Freq: Four times a day (QID) | INTRAMUSCULAR | Status: DC | PRN

## 2017-01-17 MED ORDER — ACETAMINOPHEN 325 MG PO TABS
325.0000 mg | ORAL_TABLET | ORAL | Status: DC | PRN
  Administered 2017-01-17 (×3): 650 mg via ORAL
  Filled 2017-01-17 (×3): qty 2

## 2017-01-17 MED ORDER — IOPAMIDOL (ISOVUE-370) INJECTION 76%
INTRAVENOUS | Status: DC | PRN
  Administered 2017-01-17: 20 mL via INTRAVENOUS

## 2017-01-17 MED ORDER — ASPIRIN EC 81 MG PO TBEC
81.0000 mg | DELAYED_RELEASE_TABLET | Freq: Every day | ORAL | Status: DC
  Administered 2017-01-17 – 2017-01-18 (×2): 81 mg via ORAL
  Filled 2017-01-17 (×2): qty 1

## 2017-01-17 SURGICAL SUPPLY — 17 items
ADAPTER SEALING SSA-EW-09 (MISCELLANEOUS) ×2 IMPLANT
ADPR INTRO LNG 9FR SL XTD WNG (MISCELLANEOUS) ×1
CABLE SURGICAL S-101-97-12 (CABLE) ×2 IMPLANT
CATH ATTAIN COM SURV 6250V-MB2 (CATHETERS) ×2 IMPLANT
DEVICE CRTP PERCEPTA QUAD MRI (Pacemaker) ×2 IMPLANT
HEMOSTAT SURGICEL 2X4 FIBR (HEMOSTASIS) ×4 IMPLANT
KIT ESSENTIALS PG (KITS) ×2 IMPLANT
LEAD CAPSURE NOVUS 45CM (Lead) ×2 IMPLANT
LEAD CAPSURE NOVUS 5076-52CM (Lead) ×2 IMPLANT
LEAD QUARTET 1458Q-86CM (Lead) ×2 IMPLANT
PAD DEFIB LIFELINK (PAD) ×2 IMPLANT
SHEATH CLASSIC 7F (SHEATH) ×4 IMPLANT
SHEATH CLASSIC 9.5F (SHEATH) ×2 IMPLANT
SLITTER 6232ADJ (MISCELLANEOUS) ×2 IMPLANT
TRAY PACEMAKER INSERTION (PACKS) ×2 IMPLANT
WIRE ACUITY WHISPER EDS 4648 (WIRE) ×2 IMPLANT
WIRE HI TORQ VERSACORE-J 145CM (WIRE) ×2 IMPLANT

## 2017-01-17 NOTE — Progress Notes (Signed)
Pt alert and oriented with stable vitals, New pacer maker site is dry and intact with pressure dressing. Monitor frequent vitals and bed rest until am EKG and CXR 01/12 Held coreg per patient request and low b/p

## 2017-01-17 NOTE — H&P (Signed)
Patient Care Team: [name], MD as PCP - General (Family Medicine)   HPI  [name] [name] is a 82 y.o. female admitted for CRT reimplantation   She has nonischemic cardiomyopathy left bundle branch block and nonsustained ventricular tachycardia.   She underwent CRT Biotronik implantation  6/16.  6/18  hospitalized and found to have staph bacteremia of (MSSA) in the wake of orthopedic surgery  TEE at [facility] was reportedly negative. She was treated with  6 weeks of antibiotics  She developed recurrent 'septic arthritis" and bacteremia and TEE>>Vegetation on RA lead.  Transferred to [facility] for extraction which she tolerated well   Rx with 8 weeks of IV Abx, most recently 10/18 keflex and rifampin; per discussions with ID, life long Abx have been chosens  After numerous discussions, given deteriorating functional status, the improvement seen post CRT and the decision for life long suppressive ABx it has been elected to reimplant CRT  She and her husband are aware of infection risks and that device reinfection could be catastrophic  Functional status improved somewhat on entresto and when I saw her in offcie 12/18 with Dr Verlin Fester, she was euvolemic but still beset with severe fatigue   Whe remains SOB < 50 ft No edema  EF echo 35>>55 and now back to 35  Past Medical History:  Diagnosis Date  . 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 Arthritis    "finger joints" (06/21/2016)  . Asthma   . CHF (congestive heart failure) (HCC)   . Congestive dilated cardiomyopathy (HCC) 03/31/2014   a. s/p Biotronik CRTD 06/2014  . GERD (gastroesophageal reflux disease)   . History of blood transfusion 1990s   "related to hysterectomy"  . Hypothyroidism   . Left bundle branch block   . MSSA bacteremia 06/21/2016  . Myocardial infarction (HCC)   . Pancreatitis, acute     Past Surgical  History:  Procedure Laterality Date  . ABDOMINAL HYSTERECTOMY    . APPENDECTOMY    . CARDIAC CATHETERIZATION    . CARDIAC DEFIBRILLATOR PLACEMENT  ?2016  . CATARACT EXTRACTION W/ INTRAOCULAR LENS  IMPLANT, BILATERAL Bilateral   . CHOLECYSTECTOMY OPEN     "related to pancreatitis"  . DILATION AND CURETTAGE OF UTERUS    . EP IMPLANTABLE DEVICE N/A 07/04/2014   Procedure: BiV ICD Insertion CRT-D;  Surgeon: Duke Salvia, MD;  Location: [facility] INVASIVE CV LAB;  Service: Cardiovascular;  Laterality: N/A;  . HERNIA REPAIR    . I&D KNEE WITH POLY EXCHANGE Right 08/11/2016   Procedure: IRRIGATION AND DEBRIDEMENT KNEE WITH POLY EXCHANGE;  Surgeon: Samson Frederic, MD;  Location: [facility] OR;  Service: Orthopedics;  Laterality: Right;  . INSERT / REPLACE / REMOVE PACEMAKER  ?2016  . JOINT REPLACEMENT Bilateral    "thumb joints; used my own material"  . JOINT REPLACEMENT    . LEFT HEART CATHETERIZATION WITH CORONARY ANGIOGRAM N/A 11/02/2013   Procedure: LEFT HEART CATHETERIZATION WITH CORONARY ANGIOGRAM;  Surgeon: Corky Crafts, MD;  Location: [facility] CATH LAB;  Service: Cardiovascular;  Laterality: N/A;  . LOOP RECORDER IMPLANT N/A 04/13/2014   Procedure: LOOP RECORDER IMPLANT;  Surgeon: Duke Salvia, MD;  Location: [facility] CATH LAB;  Service: Cardiovascular;  Laterality: N/A;  . LOOP RECORDER REMOVAL  ?2016  . POSTERIOR LUMBAR FUSION  05/03/2016   Lumbar Two-Three, Lumbar Three-Four Posterior Lumbar  Fusion withLaminectomy and Foraminotomy  . SPINE SURGERY  04/2016   lumbar   . TEE WITHOUT CARDIOVERSION N/A 08/14/2016   Procedure: TRANSESOPHAGEAL ECHOCARDIOGRAM (TEE);  Surgeon: Lars Masson, MD;  Location: [facility] ENDOSCOPY;  Service: Cardiovascular;  Laterality: N/A;  . TONSILLECTOMY    . TOTAL HIP ARTHROPLASTY Bilateral   . UMBILICAL HERNIA REPAIR  1970s?    Current Facility-Administered Medications  Medication Dose Route Frequency Provider Last Rate Last Dose  . 0.9 %  sodium chloride infusion    Intravenous Continuous Gypsy Balsam K, NP 50 mL/hr at 01/17/17 0937    . 0.9 %  sodium chloride infusion   Intravenous Continuous Gypsy Balsam K, NP 50 mL/hr at 01/17/17 0937    . ceFAZolin (ANCEF) IVPB 2g/100 mL premix  2 g Intravenous On Call Gypsy Balsam K, NP      . chlorhexidine (HIBICLENS) 4 % liquid 4 application  60 mL Topical Once Glory Buff, [facility] K, NP      . gentamicin (GARAMYCIN) 80 mg in sodium chloride irrigation 0.9 % 500 mL irrigation  80 mg Irrigation On Call Marily Lente, NP        Allergies  Allergen Reactions  . Sulfa Antibiotics Hives      Social History   Tobacco Use  . Smoking status: Never Smoker  . Smokeless tobacco: Never Used  . Tobacco comment: "smoked as a teenager; for maybe 1 wk"  Substance Use Topics  . Alcohol use: Yes    Comment: 6/15//2018 "glass of wine q 6 months or so"  . Drug use: No     Family History  Problem Relation Age of Onset  . Hyperlipidemia Mother   . Arrhythmia Mother        palpitations  . Heart disease Father   . Heart attack Father   . Heart attack Maternal Grandfather   . Heart attack Paternal Grandfather      Current Meds  Medication Sig  . albuterol (PROAIR HFA) 108 (90 Base) MCG/ACT inhaler Inhale 2 puffs into the lungs every 6 (six) hours as needed for wheezing or shortness of breath.  [name] aspirin EC 81 MG tablet Take 81 mg by mouth daily.  [name] aspirin-acetaminophen-caffeine (EXCEDRIN MIGRAINE) 250-250-65 MG per tablet Take 2 tablets by mouth every 8 (eight) hours as needed for headache or migraine.   . Calcium Carbonate Antacid (TUMS PO) Take 1 tablet by mouth daily as needed (indigestion).   . carvedilol (COREG) 12.5 MG tablet Take 1.5 tablets (18.75 mg total) 2 (two) times daily by mouth.  . cephALEXin (KEFLEX) 500 MG capsule Take 1 capsule (500 mg total) by mouth 4 (four) times daily.  [name] docusate sodium (COLACE) 100 MG capsule Take 1 capsule (100 mg total) by mouth 2 (two) times daily. (Patient taking  differently: Take 100 mg by mouth daily as needed for mild constipation. )  . feeding supplement, ENSURE ENLIVE, (ENSURE ENLIVE) LIQD Take 237 mLs by mouth 2 (two) times daily between meals. (Patient taking differently: Take 237 mLs by mouth daily. )  . furosemide (LASIX) 20 MG tablet Take 1 tablet (20 mg total) by mouth daily. (Patient taking differently: Take 20 mg by mouth daily as needed for fluid. )  . levothyroxine (SYNTHROID, LEVOTHROID) 112 MCG tablet Take 112 mcg by mouth daily before breakfast.  . Magnesium 200 MG TABS Take 200 mg by mouth daily.  . Melatonin 5 MG TABS Take 5 mg by mouth at bedtime as needed (for sleep.).   [name]  methocarbamol (ROBAXIN) 500 MG tablet Take 1 tablet (500 mg total) by mouth every 6 (six) hours as needed for muscle spasms.  . methylphenidate (RITALIN) 20 MG tablet Take 20 mg by mouth daily as needed (narcolepsy).   March Rummage (ALLERGY EYE OP) Place 1-2 drops into both eyes daily as needed (for irritated eyes).  . nitroGLYCERIN (NITROSTAT) 0.4 MG SL tablet Place 1 tablet (0.4 mg total) every 5 (five) minutes as needed under the tongue for chest pain.  [name] ondansetron (ZOFRAN) 4 MG tablet Take 1 tablet (4 mg total) by mouth every 6 (six) hours as needed for nausea.  . polyethylene glycol (MIRALAX / GLYCOLAX) packet Take 17 g by mouth daily as needed for mild constipation.  . Probiotic Product (PROBIOTIC PO) Take 1 capsule by mouth every other day.   . rifampin (RIFADIN) 300 MG capsule TAKE 1 CAPSULE(300 MG) BY MOUTH EVERY 12 HOURS  . sacubitril-valsartan (ENTRESTO) 24-26 MG Take 1 tablet by mouth 2 (two) times daily.  [name] spironolactone (ALDACTONE) 25 MG tablet Take 0.5 tablets (12.5 mg total) by mouth daily.     Review of Systems negative except from HPI and PMH  Physical Exam BP 121/65   Pulse 65   Temp 97.6 F (36.4 C) (Oral)   Resp 18   Ht 4\' 10"  (1.473 m)   Wt 155 lb (70.3 kg)   LMP  (LMP Unknown)   SpO2 99%   BMI 32.40 kg/m  Well  developed and nourished in no acute distress HENT normal Neck supple with JVP-flat Carotids brisk and full without bruits Clear Regular rate and rhythm, no murmurs or gallops Abd-soft with active BS without hepatomegaly No Clubbing cyanosis edema Skin-warm and dry A & Oriented  Grossly normal sensory and motor function  ECG sinus with LBBB  Assessment and  Plan  NiCM  Congestive heart failure  Chronic knee inferction    Plan is to reimplant device CRT for heart failure management   The benefits and risks were reviewed including but not limited to death,  perforation, infection, lead dislodgement and device malfunction.  The patient understands agrees and is willing to proceed.

## 2017-01-17 NOTE — Plan of Care (Signed)
  Progressing Education: Knowledge of General Education information will improve 01/17/2017 2349 - Progressing by Elnita Maxwell, RN Health Behavior/Discharge Planning: Ability to manage health-related needs will improve 01/17/2017 2349 - Progressing by Elnita Maxwell, RN Clinical Measurements: Ability to maintain clinical measurements within normal limits will improve 01/17/2017 2349 - Progressing by Elnita Maxwell, RN Will remain free from infection 01/17/2017 2349 - Progressing by Elnita Maxwell, RN Diagnostic test results will improve 01/17/2017 2349 - Progressing by Elnita Maxwell, RN Respiratory complications will improve 01/17/2017 2349 - Progressing by Elnita Maxwell, RN Cardiovascular complication will be avoided 01/17/2017 2349 - Progressing by Elnita Maxwell, RN Activity: Risk for activity intolerance will decrease 01/17/2017 2349 - Progressing by Elnita Maxwell, RN Nutrition: Adequate nutrition will be maintained 01/17/2017 2349 - Progressing by Elnita Maxwell, RN Coping: Level of anxiety will decrease 01/17/2017 2349 - Progressing by Elnita Maxwell, RN Elimination: Will not experience complications related to bowel motility 01/17/2017 2349 - Progressing by Elnita Maxwell, RN Will not experience complications related to urinary retention 01/17/2017 2349 - Progressing by Elnita Maxwell, RN Pain Managment: General experience of comfort will improve 01/17/2017 2349 - Progressing by Elnita Maxwell, RN Safety: Ability to remain free from injury will improve 01/17/2017 2349 - Progressing by Elnita Maxwell, RN Skin Integrity: Risk for impaired skin integrity will decrease 01/17/2017 2349 - Progressing by Elnita Maxwell, RN

## 2017-01-17 NOTE — Discharge Instructions (Signed)
° ° °  Supplemental Discharge Instructions for  Pacemaker/Defibrillator Patients  Activity No heavy lifting or vigorous activity with your left/right arm for 6 to 8 weeks.  Do not raise your left/right arm above your head for one week.  Gradually raise your affected arm as drawn below.             01/21/17                     01/22/17                     01/23/17                   01/24/17 __  NO DRIVING for 1 week  ; you may begin driving on  3/87/56 .  WOUND CARE - Keep the wound area clean and dry.  Do not get this area wet, no showers for one week; you may shower on 01/24/17 . - The tape/steri-strips on your wound will fall off; do not pull them off.  No bandage is needed on the site.  DO  NOT apply any creams, oils, or ointments to the wound area. - If you notice any drainage or discharge from the wound, any swelling or bruising at the site, or you develop a fever > 101? F after you are discharged home, call the office at once.  Special Instructions - You are still able to use cellular telephones; use the ear opposite the side where you have your pacemaker/defibrillator.  Avoid carrying your cellular phone near your device. - When traveling through airports, show security personnel your identification card to avoid being screened in the metal detectors.  Ask the security personnel to use the hand wand. - Avoid arc welding equipment, MRI testing (magnetic resonance imaging), TENS units (transcutaneous nerve stimulators).  Call the office for questions about other devices. - Avoid electrical appliances that are in poor condition or are not properly grounded. - Microwave ovens are safe to be near or to operate.  Additional information for defibrillator patients should your device go off: - If your device goes off ONCE and you feel fine afterward, notify the device clinic nurses. - If your device goes off ONCE and you do not feel well afterward, call 911. - If your device goes off TWICE, call  911. - If your device goes off THREE times in one day, call 911.  DO NOT DRIVE YOURSELF OR A FAMILY MEMBER WITH A DEFIBRILLATOR TO THE HOSPITAL--CALL 911.

## 2017-01-18 ENCOUNTER — Ambulatory Visit (HOSPITAL_COMMUNITY): Payer: Medicare Other

## 2017-01-18 ENCOUNTER — Other Ambulatory Visit: Payer: Self-pay

## 2017-01-18 DIAGNOSIS — I447 Left bundle-branch block, unspecified: Secondary | ICD-10-CM | POA: Diagnosis not present

## 2017-01-18 DIAGNOSIS — I5022 Chronic systolic (congestive) heart failure: Secondary | ICD-10-CM | POA: Diagnosis not present

## 2017-01-18 DIAGNOSIS — I428 Other cardiomyopathies: Secondary | ICD-10-CM

## 2017-01-18 DIAGNOSIS — T827XXA Infection and inflammatory reaction due to other cardiac and vascular devices, implants and grafts, initial encounter: Secondary | ICD-10-CM | POA: Diagnosis not present

## 2017-01-18 NOTE — Progress Notes (Signed)
Progress Note  [name] Date of Encounter: 01/18/2017  Primary Cardiologist: Delton See Subjective   No chest pain or sob. Had some diaphragmatic stim earlier which resolved with reprogramming of her device.  Inpatient Medications    Scheduled Meds: . aspirin EC  81 mg Oral Daily  . carvedilol  18.75 mg Oral BID WC  . cephALEXin  500 mg Oral TID  . levothyroxine  112 mcg Oral QAC breakfast  . magnesium oxide  200 mg Oral Daily  . rifampin  300 mg Oral Q12H  . sacubitril-valsartan  1 tablet Oral BID  . spironolactone  12.5 mg Oral Daily   Continuous Infusions:  PRN Meds: acetaminophen, albuterol, aspirin-acetaminophen-caffeine, Melatonin, methocarbamol, methylphenidate, nitroGLYCERIN, ondansetron (ZOFRAN) IV, ondansetron, polyethylene glycol   Vital Signs    Vitals:   01/17/17 1856 01/18/17 0047 01/18/17 0550 01/18/17 0553  BP: (!) 110/51 130/79  136/71  Pulse: 68 82  78  Resp:  16  18  Temp:  98.1 F (36.7 C)  98.1 F (36.7 C)  TempSrc:  Oral  Oral  SpO2:  93%  95%  Weight:   158 lb 12.8 oz (72 kg)   Height:        Intake/Output Summary (Last 24 hours) at 01/18/2017 1045 Last data filed at 01/18/2017 1023 Gross per 24 hour  Intake 610 ml  Output 450 ml  Net 160 ml   Filed Weights   01/17/17 0911 01/18/17 0550  Weight: 155 lb (70.3 kg) 158 lb 12.8 oz (72 kg)    Telemetry    nsr with biv pacing - Personally Reviewed  ECG    none - Personally Reviewed  Physical Exam   GEN: No acute distress.   Neck: No JVD Cardiac: RRR, no murmurs, rubs, or gallops.  Respiratory: Clear to auscultation bilaterally. Incision with no hematoma. GI: Soft, nontender, non-distended  MS: No edema; No deformity. Neuro:  Nonfocal  Psych: Normal affect   Labs    ChemistryNo results for input(s): NA, K, CL, CO2, GLUCOSE, BUN, CREATININE, CALCIUM, PROT, ALBUMIN, AST, ALT, ALKPHOS, BILITOT, GFRNONAA, GFRAA, ANIONGAP in the last 168 hours.   HematologyNo  results for input(s): WBC, RBC, HGB, HCT, MCV, MCH, MCHC, RDW, PLT in the last 168 hours.  Cardiac EnzymesNo results for input(s): TROPONINI in the last 168 hours. No results for input(s): TROPIPOC in the last 168 hours.   BNPNo results for input(s): BNP, PROBNP in the last 168 hours.   DDimer No results for input(s): DDIMER in the last 168 hours.   Radiology    Dg Chest 2 View  Result Date: 01/18/2017 CLINICAL DATA:  Replacement of defibrillator device.  Soreness. EXAM: CHEST  2 VIEW COMPARISON:  One-view chest x-ray 06/24/2016 FINDINGS: Heart size is exaggerated by low lung volumes. Lungs are clear. Defibrillator wires terminate in the right atrium, right ventricle, and coronary sinus. Leads enter via the left subclavian vein. There is no pneumothorax. The lung volumes are low. IMPRESSION: 1. Interval placement of pacemaker/defibrillator via a left subclavian approach without complication. 2. Low lung volumes. Electronically Signed   By: Marin Roberts M.D.   On: 01/18/2017 08:22    Cardiac Studies   none  Patient Profile     82 y.o. female admitted for biv PPM insertion  Assessment & Plan    1. BiV PPM insertion - she is doing well and is ready for DC home. Her device has been interogated and reprogrammed under my direct supervision.  2.  Chronic systolic heart failure - she will continue her current meds. She needs to lose weight and maintain a low sodium diet.  For questions or updates, please contact [facility] Please consult [email or web address] for contact info under Cardiology/STEMI.      Signed, Lewayne Bunting, MD  01/18/2017, 10:45 AM  Patient ID: [name] [name], female   DOB: June 20, 1935, 83 y.o.   MRN: [id number]

## 2017-01-18 NOTE — Progress Notes (Signed)
Pt ready for discharge from unit to home. Family in to provided transport. All d/c instructions, medications, and precautions for recently placed pacemaker reviewed with pt. Pt in no distress at this time. All personal belongings with patient. Patient encouraged to maintain follow up appts.

## 2017-01-18 NOTE — Discharge Summary (Signed)
Discharge Summary    Patient ID: [name] [name],  MRN: [id number], DOB/AGE: 1935/11/04 82 y.o.  Admit date: 01/17/2017 Discharge date: 01/18/2017  Primary Care Provider: [name] Primary Cardiologist: Tobias Alexander, MD  Discharge Diagnoses    Active Problems:   Chronic systolic CHF (congestive heart failure), NYHA class 3 (HCC)   Allergies Allergies  Allergen Reactions  . Sulfa Antibiotics Hives    Diagnostic Studies/Procedures     _____________   History of Present Illness     [name] [name] is a 82 y.o. female admitted for CRT reimplantation  She has nonischemic cardiomyopathy left bundle branch block and nonsustained ventricular tachycardia.   She underwent CRT Biotronik implantation 6/16. 6/18 hospitalized and found to have staph bacteremia of (MSSA) in the wake of orthopedic surgery TEE at [facility] was reportedly negative. She was treated with 6 weeks of antibiotics She developed recurrent 'septic arthritis" and bacteremia and TEE>>Vegetation on RA lead. Transfer[facility] to [facility] for extraction which she tolerated well Rx with 8 weeks of IV Abx, most recently 10/18 keflex and rifampin; per discussions with ID, life long Abx have been chosens  After numerous discussions, given deteriorating functional status, the improvement seen post CRT and the decision for life long suppressive ABx it has been elected to reimplant CRT  She and her husband are aware of infection risks and that device reinfection could be catastrophic  Functional status improved somewhat on entresto and when I saw her in offcie 12/18 with Dr Verlin Fester, she was euvolemic but still beset with severe fatigue   Whe remains SOB < 50 ft No edema  EF echo 35>>55 and now back to [facility]     Consultants: none  1. BiV PPM insertion - she is doing well and is ready for DC home. Her device has been interogated and reprogrammed under my direct supervision.  2.  Chronic systolic heart failure - she will continue her current meds. She needs to lose weight and maintain a low sodium diet.  Patient has been seen by Dr. Ladona Ridgel today and deemed ready for discharge home. All follow up appointments have been scheduled. Discharge medications are listed below. _____________  Discharge Vitals Blood pressure 136/71, pulse 78, temperature 98.1 F (36.7 C), temperature source Oral, resp. rate 18, height 4\' 10"  (1.473 m), weight 158 lb 12.8 oz (72 kg), SpO2 95 %.  Filed Weights   01/17/17 0911 01/18/17 0550  Weight: 155 lb (70.3 kg) 158 lb 12.8 oz (72 kg)    Labs & Radiologic Studies    CBC No results for input(s): WBC, NEUTROABS, HGB, HCT, MCV, PLT in the last 72 hours. Basic Metabolic Panel No results for input(s): NA, K, CL, CO2, GLUCOSE, BUN, CREATININE, CALCIUM, MG, PHOS in the last 72 hours. Liver Function Tests No results for input(s): AST, ALT, ALKPHOS, BILITOT, PROT, ALBUMIN in the last 72 hours. No results for input(s): LIPASE, AMYLASE in the last 72 hours. Cardiac Enzymes No results for input(s): CKTOTAL, CKMB, CKMBINDEX, TROPONINI in the last 72 hours. BNP Invalid input(s): POCBNP D-Dimer No results for input(s): DDIMER in the last 72 hours. Hemoglobin A1C No results for input(s): HGBA1C in the last 72 hours. Fasting Lipid Panel No results for input(s): CHOL, HDL, LDLCALC, TRIG, CHOLHDL, LDLDIRECT in the last 72 hours. Thyroid Function Tests No results for input(s): TSH, T4TOTAL, T3FREE, THYROIDAB in the last 72 hours.  Invalid input(s): FREET3 _____________  Dg Chest 2 View  Result Date: 01/18/2017 CLINICAL DATA:  Replacement of defibrillator device.  Soreness. EXAM: CHEST  2 VIEW COMPARISON:  One-view chest x-ray 06/24/2016 FINDINGS: Heart size is exaggerated by low lung volumes. Lungs are clear. Defibrillator wires terminate in the right atrium, right ventricle, and coronary sinus. Leads enter via the left subclavian vein. There is no  pneumothorax. The lung volumes are low. IMPRESSION: 1. Interval placement of pacemaker/defibrillator via a left subclavian approach without complication. 2. Low lung volumes. Electronically Signed   By: Marin Roberts M.D.   On: 01/18/2017 08:22   Disposition   Pt is being discharged home today in good condition.  Follow-up Plans & Appointments    Follow-up Information    [facility] [address] [facility] Follow up on 01/29/2017.   Specialty:  Cardiology Why:  3:00PM, wound check visit Contact information: [address] [place] [address] [phone]       Duke Salvia, MD Follow up on 04/21/2017.   Specialty:  Cardiology Why:  10:15AM Contact information: [address] [place] [address] [phone]          Discharge Instructions    Call MD for:  redness, tenderness, or signs of infection (pain, swelling, redness, odor or green/yellow discharge around incision site)   Complete by:  As directed    Diet - low sodium heart healthy   Complete by:  As directed    Discharge instructions   Complete by:  As directed    Supplemental Discharge Instructions for  Pacemaker/Defibrillator Patients  Activity No heavy lifting or vigorous activity with your left/right arm for 6 to 8 weeks.  Do not raise your left/right arm above your head for one week.  Gradually raise your affected arm as drawn below.           __  NO DRIVING for     ; you may begin driving on     .  WOUND CARE Keep the wound area clean and dry.  Do not get this area wet for one week. No showers for one week; you may shower on     . The tape/steri-strips on your wound will fall off; do not pull them off.  No bandage is needed on the site.  DO  NOT apply any creams, oils, or ointments to the wound area. If you notice any drainage or discharge from the wound, any swelling or bruising at the site, or you develop a fever > 101? F after you are discharged  home, call the office at once.  Special Instructions You are still able to use cellular telephones; use the ear opposite the side where you have your pacemaker/defibrillator.  Avoid carrying your cellular phone near your device. When traveling through airports, show security personnel your identification card to avoid being screened in the metal detectors.  Ask the security personnel to use the hand wand. Avoid arc welding equipment, MRI testing (magnetic resonance imaging), TENS units (transcutaneous nerve stimulators).  Call the office for questions about other devices. Avoid electrical appliances that are in poor condition or are not properly grounded. Microwave ovens are safe to be near or to operate.  Additional information for defibrillator patients should your device go off: If your device goes off ONCE and you feel fine afterward, notify the device clinic nurses. If your device goes off ONCE and you do not feel well afterward, call 911. If your device goes off TWICE, call 911. If your device  goes off THREE times in one day, call 911.  DO NOT DRIVE YOURSELF OR A FAMILY MEMBER WITH A DEFIBRILLATOR TO THE HOSPITAL-CALL 911.   Increase activity slowly   Complete by:  As directed       Discharge Medications   Allergies as of 01/18/2017      Reactions   Sulfa Antibiotics Hives      Medication List    TAKE these medications   ALLERGY EYE OP Place 1-2 drops into both eyes daily as needed (for irritated eyes).   alum & mag hydroxide-simeth 200-200-20 MG/5ML suspension Commonly known as:  MAALOX/MYLANTA Take 30 mLs by mouth every 4 (four) hours as needed for indigestion.   aspirin EC 81 MG tablet Take 81 mg by mouth daily.   aspirin-acetaminophen-caffeine 250-250-65 MG tablet Commonly known as:  EXCEDRIN MIGRAINE Take 2 tablets by mouth every 8 (eight) hours as needed for headache or migraine.   augmented betamethasone dipropionate 0.05 % ointment Commonly known as:   DIPROLENE-AF Apply 1 application topically daily as needed (acrodermatitis [place] on fingers/toes).   betamethasone dipropionate 0.05 % ointment Commonly known as:  DIPROLENE Apply 1 application topically daily as needed.   carvedilol 12.5 MG tablet Commonly known as:  COREG Take 1.5 tablets (18.75 mg total) 2 (two) times daily by mouth.   cephALEXin 500 MG capsule Commonly known as:  KEFLEX Take 1 capsule (500 mg total) by mouth 4 (four) times daily.   diphenhydrAMINE 12.5 MG/5ML elixir Commonly known as:  BENADRYL Take 5-10 mLs (12.5-25 mg total) by mouth every 4 (four) hours as needed for itching.   docusate sodium 100 MG capsule Commonly known as:  COLACE Take 1 capsule (100 mg total) by mouth 2 (two) times daily. What changed:    when to take this  reasons to take this   feeding supplement (ENSURE ENLIVE) Liqd Take 237 mLs by mouth 2 (two) times daily between meals. What changed:  when to take this   furosemide 20 MG tablet Commonly known as:  LASIX Take 1 tablet (20 mg total) by mouth daily. What changed:    when to take this  reasons to take this   levothyroxine 112 MCG tablet Commonly known as:  SYNTHROID, LEVOTHROID Take 112 mcg by mouth daily before breakfast.   Magnesium 200 MG Tabs Take 200 mg by mouth daily.   Melatonin 5 MG Tabs Take 5 mg by mouth at bedtime as needed (for sleep.).   menthol-cetylpyridinium 3 MG lozenge Commonly known as:  CEPACOL Take 1 lozenge (3 mg total) by mouth as needed for sore throat (sore throat).   methocarbamol 500 MG tablet Commonly known as:  ROBAXIN Take 1 tablet (500 mg total) by mouth every 6 (six) hours as needed for muscle spasms.   methylphenidate 20 MG tablet Commonly known as:  RITALIN Take 20 mg by mouth daily as needed (narcolepsy).   metoCLOPramide 5 MG tablet Commonly known as:  REGLAN Take 1-2 tablets (5-10 mg total) by mouth every 8 (eight) hours as needed for nausea (if ondansetron (ZOFRAN)  ineffective.).   nitroGLYCERIN 0.4 MG SL tablet Commonly known as:  NITROSTAT Place 1 tablet (0.4 mg total) every 5 (five) minutes as needed under the tongue for chest pain.   ondansetron 4 MG tablet Commonly known as:  ZOFRAN Take 1 tablet (4 mg total) by mouth every 6 (six) hours as needed for nausea.   polyethylene glycol packet Commonly known as:  MIRALAX / GLYCOLAX Take 17 g  by mouth daily as needed for mild constipation.   PROAIR HFA 108 (90 Base) MCG/ACT inhaler Generic drug:  albuterol Inhale 2 puffs into the lungs every 6 (six) hours as needed for wheezing or shortness of breath.   PROBIOTIC PO Take 1 capsule by mouth every other day.   rifampin 300 MG capsule Commonly known as:  RIFADIN TAKE 1 CAPSULE(300 MG) BY MOUTH EVERY 12 HOURS   sacubitril-valsartan 24-26 MG Commonly known as:  ENTRESTO Take 1 tablet by mouth 2 (two) times daily.   senna 8.6 MG Tabs tablet Commonly known as:  SENOKOT Take 2 tablets (17.2 mg total) by mouth at bedtime.   spironolactone 25 MG tablet Commonly known as:  ALDACTONE Take 0.5 tablets (12.5 mg total) by mouth daily.   TUMS PO Take 1 tablet by mouth daily as needed (indigestion).         Duration of Discharge Encounter   Greater than 30 minutes including physician time.  Signed, Berton Bon NP 01/18/2017, 12:25 PM   EP Attending  Patient seen and examined. Agree with above. She is stable for DC home. Usual followup.  [name] [name].D.

## 2017-01-18 NOTE — Plan of Care (Signed)
  Education: Knowledge of General Education information will improve 01/18/2017 0721 - Progressing by Chaya Jan, RN   Health Behavior/Discharge Planning: Ability to manage health-related needs will improve 01/18/2017 0721 - Progressing by Chaya Jan, RN   Clinical Measurements: Ability to maintain clinical measurements within normal limits will improve 01/18/2017 0721 - Progressing by Chaya Jan, RN Will remain free from infection 01/18/2017 0721 - Progressing by Chaya Jan, RN Diagnostic test results will improve 01/18/2017 0721 - Progressing by Chaya Jan, RN Respiratory complications will improve 01/18/2017 0721 - Progressing by Chaya Jan, RN Cardiovascular complication will be avoided 01/18/2017 0721 - Progressing by Chaya Jan, RN   Activity: Risk for activity intolerance will decrease 01/18/2017 0721 - Progressing by Chaya Jan, RN   Nutrition: Adequate nutrition will be maintained 01/18/2017 0721 - Progressing by Chaya Jan, RN   Coping: Level of anxiety will decrease 01/18/2017 0721 - Progressing by Chaya Jan, RN   Elimination: Will not experience complications related to bowel motility 01/18/2017 0721 - Progressing by Chaya Jan, RN Will not experience complications related to urinary retention 01/18/2017 0721 - Progressing by Chaya Jan, RN

## 2017-01-18 NOTE — Op Note (Signed)
NAME:  [name] [name], [name] [name]               ACCOUNT NO.:  [id number]  MEDICAL RECORD NO.:  [id number]  LOCATION:  [facility]                         FACILITY:  [facility]  PHYSICIAN:  Duke Salvia, MD, FACCDATE OF BIRTH:  09-23-1935  DATE OF PROCEDURE:  01/17/2017 DATE OF DISCHARGE:                              OPERATIVE REPORT   PREOPERATIVE DIAGNOSIS:  Nonischemic cardiomyopathy with prior device explant secondary to infection with device reimplant.  POSTOPERATIVE DIAGNOSIS:  Nonischemic cardiomyopathy with prior device explant secondary to infection with device reimplant.  PROCEDURE:  CRT-P implantation with peripheral venography.  Following obtaining informed consent, the patient was brought to Electrophysiology Laboratory and placed on the fluoroscopic table in supine position.  Prior to routine prep and drape, a venogram of the left subclavian vein demonstrated patency, but mild stenosis.  After routine prep and drape, then lidocaine was infiltrated just caudal to the prior incision carried down to the layer of the remnant of the prepectoral fascia.  This was also all scarred from prior device explantation.  We then obtained venous access without difficulty and without the aspiration or puncture of the artery.  It was somewhat difficult.  So, we ended up double wiring one of our accesses.  There was stenosis as noted proximally and there was a sharp turn at the innominate SVC junction requiring the use of a Wholey wire.  We then had 3 wires placed.  Sequentially, then we placed 7-French, 9-French, and 7-French sheaths through which we then passed a Medtronic 5076, 52-cm active fixation ventricular lead, serial number [id number].  A Medtronic MB2 CS cannulation catheter and a Medtronic 5076, 45-cm active fixation atrial lead, serial number [id number].  Under fluoroscopic guidance, the RV lead was manipulated to the apex where it was deployed.  The lead was secured.  It was then moved  following insertion of all leads because of complaints of pleuritic chest pain.  In its final location, the R-wave was 11.9 with a pace impedance of 924, a threshold of 1.1 V at 0.5 milliseconds.  Current threshold was 1.1 mA and there was no diaphragmatic pacing at 10 V.  The current of injury was brisk. This lead was secured to the prepectoral fascia.  The coronary sinus was cannulated without difficulty.  Nonocclusive venography demonstrated a bifurcating mid lateral branch, which was targeted.  The superior branch was utilized.  A [employer] 1458 lead, serial number [id number] was deployed to the junction between the mid and distal thirds.  In this location, the bipolar L-wave was 7.2 with a pace impedance of 865 and a threshold in the 3-4 configuration of 2.5 V at 0.5 milliseconds, no pacing was accomplished with the distal pole.  This lead was secured to the prepectoral fascia and then the atrial lead was deployed.  Following deployment of the atrial lead, the patient was complaining of pleuritic pain and the lead was repositioned again in the right atrial appendage where the bipolar P-wave was 1.8 with a pace impedance of 886. The pacing threshold was 0.6 V at 0.5 milliseconds.  Current threshold was 0.8 mA.  There was no diaphragmatic pacing at 10 V and the current of injury was brisk.  These  leads were then all secured to the prepectoral fascia again and leads were attached to a Medtronic Percepta CRT pulse generator, serial number [id number].  Through this device, P- synchronous pacing was noted with inversion of the QRS complexes.  I should note that the Q-LV in the 1-2 configuration was about 150 milliseconds and in the 3-4 pole was 170 milliseconds.  We then obtained hemostasis.  This took a little bit of time presumably related to the scar tissue that had been disrupted to graft the new pocket.  Following hemostasis, the leads and pulse generator were placed in the pocket,  secured to the prepectoral fascia.  The pocket was copiously irrigated with antibiotic-containing saline solution and Surgicel was placed on the lateral aspect of the superior aspect of the pocket.  The wound was closed in 2 layers in normal fashion.  The wound was also dried and a benzoin and Steri-Strip dressing was applied. Needle counts, sponge counts, and instrument counts were correct at the end of the procedure according to staff.  The patient tolerated the procedure without apparent complication.     Duke Salvia, MD, [facility]     SCK/MEDQ  D:  01/17/2017  T:  01/18/2017  Job:  [id number]

## 2017-01-20 ENCOUNTER — Encounter (HOSPITAL_COMMUNITY): Payer: Self-pay | Admitting: Internal Medicine

## 2017-01-20 MED FILL — Lidocaine HCl Local Preservative Free (PF) Inj 1%: INTRAMUSCULAR | Qty: 30 | Status: AC

## 2017-01-20 MED FILL — Cefazolin Sodium-Dextrose IV Solution 2 GM/100ML-4%: INTRAVENOUS | Qty: 100 | Status: AC

## 2017-01-20 MED FILL — Heparin Sodium (Porcine) 2 Unit/ML in Sodium Chloride 0.9%: INTRAMUSCULAR | Qty: 500 | Status: AC

## 2017-01-22 ENCOUNTER — Encounter: Payer: Self-pay | Admitting: Cardiology

## 2017-01-22 ENCOUNTER — Ambulatory Visit (INDEPENDENT_AMBULATORY_CARE_PROVIDER_SITE_OTHER): Payer: Medicare Other | Admitting: Cardiology

## 2017-01-22 VITALS — BP 136/60 | HR 68 | Ht <= 58 in | Wt 159.1 lb

## 2017-01-22 DIAGNOSIS — I428 Other cardiomyopathies: Secondary | ICD-10-CM

## 2017-01-22 DIAGNOSIS — I10 Essential (primary) hypertension: Secondary | ICD-10-CM

## 2017-01-22 DIAGNOSIS — I5022 Chronic systolic (congestive) heart failure: Secondary | ICD-10-CM

## 2017-01-22 DIAGNOSIS — Z95810 Presence of automatic (implantable) cardiac defibrillator: Secondary | ICD-10-CM

## 2017-01-22 DIAGNOSIS — I447 Left bundle-branch block, unspecified: Secondary | ICD-10-CM

## 2017-01-22 NOTE — Progress Notes (Signed)
Cardiology Office Note Date:  01/22/2017  Patient ID:  [name] [name], [name] [name] 1935/02/17, MRN [id number] PCP:  [name], MD  Cardiologist:  Dr. Delton See Electrophysiologist: Dr. Graciela Husbands   Chief Complaint: Two-week follow-up, nonischemic cardiomyopathy, CHF, status post explantation of infected biventricular ICD.  History of Present Illness: [name] [name] is a 82 y.o. female with history of NICM w/hx of CRT-D though extracted 2/2 recurrent MSSA bacteremia,  asthma, LBBB, chronic CHF (systolic), hypothyroidism.  She was admitted to [facility] initially 6/7-18 with MSSA bacteremia of uncertain etiology, during this time severe sepsis and had 2/2 blood cultures positive on 6/7.  She had an LP and a CT chest that suggested RML and lingular infiltrates.  She was treated at various times with Vanc, Rocephin, Levaquin, and Doxy and she was eventually changed to Ancef per ID recommendations and completed 4 weeks of therapy.  TTE and TEE were negative.  She "felt great" upon completing her antibiotics on 7/5.    She had a bilateral TKR 14 years ago and on Thursday (8/2) noticed R knee stiffness/pain.  On Friday (8/3) it was swollen and hot, presented to [facility] 08/11/16 with acutely increased swelling and pain, in the ER Grossly purulent fluid on right knee arthrocentesis, admitted with septic arthritis and underwent I&D of R knee with ply exchange.  TEE this admission was + for vegetation on RA lead, and transferred to [facility] for device extraction (given Dr. Ladona Ridgel was away).  She underwent extraction of her device by Dr. Gary Fleet on 08/16/16.  The patient deteriorated over the next 4 months eventually with functional class NYHA 3, spironolactone in interest of helped a little bit however never back to the point when she had a biventricular pacemaker. Eventually a decision was made to reimplant her pacemaker and she underwent a placement of biventricular pacemaker on 01/17/2017 by Dr. Graciela Husbands  She states that  ever since she got her pacemaker her energy level has improved significantly, her episodes of orthopnea or proximal nocturnal dyspnea have resolved so did the lower extremity edema. She felt palpitations followed by pacemaker overriding last Friday this was not associated with dizziness or syncope. She denies any chest pain or significant shortness of breath.  Past Medical History:  Diagnosis Date  . 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 Arthritis    "finger joints" (06/21/2016)  . Asthma   . CHF (congestive heart failure) (HCC)   . Congestive dilated cardiomyopathy (HCC) 03/31/2014   a. s/p Biotronik CRTD 06/2014  . GERD (gastroesophageal reflux disease)   . History of blood transfusion 1990s   "related to hysterectomy"  . Hypothyroidism   . Left bundle branch block   . MSSA bacteremia 06/21/2016  . Myocardial infarction (HCC)   . Pancreatitis, acute     Past Surgical History:  Procedure Laterality Date  . ABDOMINAL HYSTERECTOMY    . APPENDECTOMY    . BIV PACEMAKER INSERTION CRT-P N/A 01/17/2017   Procedure: BIV PACEMAKER INSERTION CRT-P;  Surgeon: Duke Salvia, MD;  Location: [facility] INVASIVE CV LAB;  Service: Cardiovascular;  Laterality: N/A;  . CARDIAC CATHETERIZATION    . CARDIAC DEFIBRILLATOR PLACEMENT  ?2016  . CATARACT EXTRACTION W/ INTRAOCULAR LENS  IMPLANT, BILATERAL Bilateral   . CHOLECYSTECTOMY OPEN     "related to pancreatitis"  . DILATION AND CURETTAGE OF UTERUS    . EP IMPLANTABLE DEVICE N/A 07/04/2014  Procedure: BiV ICD Insertion CRT-D;  Surgeon: Duke Salvia, MD;  Location: [facility] INVASIVE CV LAB;  Service: Cardiovascular;  Laterality: N/A;  . HERNIA REPAIR    . I&D KNEE WITH POLY EXCHANGE Right 08/11/2016   Procedure: IRRIGATION AND DEBRIDEMENT KNEE WITH POLY EXCHANGE;  Surgeon: Samson Frederic, MD;  Location: [facility] OR;  Service: Orthopedics;  Laterality: Right;  . INSERT / REPLACE /  REMOVE PACEMAKER  ?2016  . JOINT REPLACEMENT Bilateral    "thumb joints; used my own material"  . JOINT REPLACEMENT    . LEFT HEART CATHETERIZATION WITH CORONARY ANGIOGRAM N/A 11/02/2013   Procedure: LEFT HEART CATHETERIZATION WITH CORONARY ANGIOGRAM;  Surgeon: Corky Crafts, MD;  Location: [facility] CATH LAB;  Service: Cardiovascular;  Laterality: N/A;  . LOOP RECORDER IMPLANT N/A 04/13/2014   Procedure: LOOP RECORDER IMPLANT;  Surgeon: Duke Salvia, MD;  Location: [facility] CATH LAB;  Service: Cardiovascular;  Laterality: N/A;  . LOOP RECORDER REMOVAL  ?2016  . POSTERIOR LUMBAR FUSION  05/03/2016   Lumbar Two-Three, Lumbar Three-Four Posterior Lumbar Fusion withLaminectomy and Foraminotomy  . SPINE SURGERY  04/2016   lumbar   . TEE WITHOUT CARDIOVERSION N/A 08/14/2016   Procedure: TRANSESOPHAGEAL ECHOCARDIOGRAM (TEE);  Surgeon: Lars Masson, MD;  Location: [facility] ENDOSCOPY;  Service: Cardiovascular;  Laterality: N/A;  . TONSILLECTOMY    . TOTAL HIP ARTHROPLASTY Bilateral   . UMBILICAL HERNIA REPAIR  1970s?    Current Outpatient Medications  Medication Sig Dispense Refill  . albuterol (PROAIR HFA) 108 (90 Base) MCG/ACT inhaler Inhale 2 puffs into the lungs every 6 (six) hours as needed for wheezing or shortness of breath.    [name] alum & mag hydroxide-simeth (MAALOX/MYLANTA) 200-200-20 MG/5ML suspension Take 30 mLs by mouth every 4 (four) hours as needed for indigestion. 355 mL 0  . aspirin EC 81 MG tablet Take 81 mg by mouth daily.    [name] aspirin-acetaminophen-caffeine (EXCEDRIN MIGRAINE) 250-250-65 MG per tablet Take 2 tablets by mouth every 8 (eight) hours as needed for headache or migraine.     [name] augmented betamethasone dipropionate (DIPROLENE-AF) 0.05 % ointment Apply 1 application topically daily as needed (acrodermatitis [place] on fingers/toes).     . betamethasone dipropionate (DIPROLENE) 0.05 % ointment Apply 1 application topically daily as needed.    . Calcium Carbonate Antacid (TUMS PO)  Take 1 tablet by mouth daily as needed (indigestion).     . carvedilol (COREG) 12.5 MG tablet Take 1.5 tablets (18.75 mg total) 2 (two) times daily by mouth. 180 tablet 3  . cephALEXin (KEFLEX) 500 MG capsule Take 1 capsule (500 mg total) by mouth 4 (four) times daily. 120 capsule 11  . diphenhydrAMINE (BENADRYL) 12.5 MG/5ML elixir Take 5-10 mLs (12.5-25 mg total) by mouth every 4 (four) hours as needed for itching. 120 mL 0  . docusate sodium (COLACE) 100 MG capsule Take 1 capsule (100 mg total) by mouth 2 (two) times daily. (Patient taking differently: Take 100 mg by mouth daily as needed for mild constipation. ) 10 capsule 0  . feeding supplement, ENSURE ENLIVE, (ENSURE ENLIVE) LIQD Take 237 mLs by mouth 2 (two) times daily between meals. (Patient taking differently: Take 237 mLs by mouth daily. ) 237 mL 12  . furosemide (LASIX) 20 MG tablet Take 1 tablet (20 mg total) by mouth daily. (Patient taking differently: Take 20 mg by mouth daily as needed for fluid. ) 90 tablet 3  . levothyroxine (SYNTHROID, LEVOTHROID) 112 MCG tablet Take 112 mcg by mouth  daily before breakfast.    . Magnesium 200 MG TABS Take 200 mg by mouth daily.    . Melatonin 5 MG TABS Take 5 mg by mouth at bedtime as needed (for sleep.).     [name] menthol-cetylpyridinium (CEPACOL) 3 MG lozenge Take 1 lozenge (3 mg total) by mouth as needed for sore throat (sore throat). 100 tablet 12  . methocarbamol (ROBAXIN) 500 MG tablet Take 1 tablet (500 mg total) by mouth every 6 (six) hours as needed for muscle spasms. 10 tablet 0  . methylphenidate (RITALIN) 20 MG tablet Take 20 mg by mouth daily as needed (narcolepsy).     [name] metoCLOPramide (REGLAN) 5 MG tablet Take 1-2 tablets (5-10 mg total) by mouth every 8 (eight) hours as needed for nausea (if ondansetron (ZOFRAN) ineffective.). 10 tablet 0  . Naphazoline-Pheniramine (ALLERGY EYE OP) Place 1-2 drops into both eyes daily as needed (for irritated eyes).    . nitroGLYCERIN (NITROSTAT) 0.4 MG  SL tablet Place 1 tablet (0.4 mg total) every 5 (five) minutes as needed under the tongue for chest pain. 90 tablet 3  . ondansetron (ZOFRAN) 4 MG tablet Take 1 tablet (4 mg total) by mouth every 6 (six) hours as needed for nausea. 20 tablet 0  . polyethylene glycol (MIRALAX / GLYCOLAX) packet Take 17 g by mouth daily as needed for mild constipation. 14 each 0  . Probiotic Product (PROBIOTIC PO) Take 1 capsule by mouth every other day.     . rifampin (RIFADIN) 300 MG capsule TAKE 1 CAPSULE(300 MG) BY MOUTH EVERY 12 HOURS 60 capsule 0  . sacubitril-valsartan (ENTRESTO) 24-26 MG Take 1 tablet by mouth 2 (two) times daily. 180 tablet 2  . senna (SENOKOT) 8.6 MG TABS tablet Take 2 tablets (17.2 mg total) by mouth at bedtime. 120 each 0  . spironolactone (ALDACTONE) 25 MG tablet Take 0.5 tablets (12.5 mg total) by mouth daily. 90 tablet 3   No current facility-administered medications for this visit.     Allergies:   Sulfa antibiotics   Social History:  The patient  reports that  has never smoked. she has never used smokeless tobacco. She reports that she drinks alcohol. She reports that she does not use drugs.   Family History:  The patient's family history includes Arrhythmia in her mother; Heart attack in her father, maternal grandfather, and paternal grandfather; Heart disease in her father; Hyperlipidemia in her mother.  ROS:  Please see the history of present illness.  All other systems are reviewed and otherwise negative.   PHYSICAL EXAM:  VS:  BP 136/60   Pulse 68   Ht 4\' 10"  (1.473 m)   Wt 159 lb 1.9 oz (72.2 kg)   LMP  (LMP Unknown)   SpO2 98%   BMI 33.26 kg/m  BMI: Body mass index is 33.26 kg/m. Well nourished, well developed, in no acute distress  HEENT: normocephalic, atraumatic  Neck: no JVD, carotid bruits or masses Cardiac: RRR ; no significant murmurs, no rubs, or gallops Lungs: CTA b/l, no wheezing, rhonchi or rales , wound post CRT insertion with no increase warmth or  erythema, mild tenderness no discharge. Abd: soft, nontender MS: no deformity or atrophy Ext: trace-1+no edema in the right lower extremity, increased warmth around the knee joint with persistent mild swelling. Right upper extremity has a PICC line that doesn't seem to be infected. Skin: warm and dry, no rash Neuro:  No gross deficits appreciated Psych: euthymic mood, full affect  ICD  extraction site: dressing/steri strips were removed well healed.  No erythema, edema, no increased heat to the tissues, no fluid collection or tenderness.   EKG:  Done today and reviewed by myself is SR, LBBB, QRS  08/14/16: TEE Study Conclusions - Left ventricle: Systolic function was normal. The estimated   ejection fraction was in the range of 55% to 60%. Wall motion was   normal; there were no regional wall motion abnormalities. - Aortic valve: No evidence of vegetation. There was mild   regurgitation. - Aorta: There was mild non-mobile atheroma. - Mitral valve: There was mild regurgitation. - Left atrium: No evidence of thrombus in the atrial cavity or   appendage. No evidence of thrombus in the atrial cavity or   appendage. - Right atrium: No evidence of thrombus in the atrial cavity or   appendage. No evidence of thrombus in the atrial cavity or   appendage. - Tricuspid valve: No evidence of vegetation. There was mild   regurgitation. - Pulmonic valve: No evidence of vegetation. Impressions: - There is a highly mobile echodensity measuring 13 x 3 mm attached   to the right atrial lead in the right atrium at the tricuspid   valve level. This is consistent with pacemaker endocarditis.   10/17/15: limited echo for AV optimization Impressions: - The study is performed for AV optimization   There is grade 1 diastolic dysfunction   The optimal AV delay seems to be around 90-100 ms but i would   defer to the doctors managing the pacer.  02/01/15: TTE Study Conclusions - Left  ventricle: The cavity size was normal. There was moderate   concentric hypertrophy. Systolic function was normal. The   estimated ejection fraction was in the range of 50% to 55%. Wall   motion was normal; there were no regional wall motion   abnormalities. Doppler parameters are consistent with abnormal   left ventricular relaxation (grade 1 diastolic dysfunction). The   E/e&' ratio is >15, suggesting elevated LV filling pressure. - Mitral valve: Calcified annulus. Mildly thickened leaflets .   There was trivial regurgitation. - Left atrium: The atrium was normal in size. - Right ventricle: The cavity size was normal. Wall thickness was   normal. Pacer wire or catheter noted in right ventricle. Systolic   function was normal. - Right atrium: The atrium was normal in size. Pacer wire or   catheter noted in right atrium. - Tricuspid valve: There was moderate regurgitation. - Pulmonary arteries: PA peak pressure: 30 mm Hg (S). - Inferior vena cava: The vessel was normal in size. The   respirophasic diameter changes were in the normal range (= 50%),   consistent with normal central venous pressure. Impressions: - Compared to a prior study in 09/2014, the EF has improved to   50-55%.  Recent Labs: 08/12/2016: TSH 0.995 11/25/2016: ALT 8 12/12/2016: Magnesium 1.9 01/08/2017: BUN 34; Creatinine, Ser 0.97; Hemoglobin 12.0; Platelets 179; Potassium 4.4; Sodium 142  No results found for requested labs within last 8760 hours.   Estimated Creatinine Clearance: 39.6 mL/min (by C-G formula based on SCr of 0.97 mg/dL).   Wt Readings from Last 3 Encounters:  01/22/17 159 lb 1.9 oz (72.2 kg)  01/18/17 158 lb 12.8 oz (72 kg)  12/25/16 154 lb 3.2 oz (69.9 kg)    Other studies reviewed: Additional studies/records reviewed today include: summarized above   ASSESSMENT AND PLAN:  1. NICM with interval improvement of her EF 35-40% -->50-55%, however CRT-D endocarditis -  s/p CRT-D explantation on  08/16/16 --> repeat ECG prolongation of QRS from 122 ms while on by the pacing, post 6 mentation patient back and left bundle branch block with QRS 168 ms at the last visit, LVEF dropped to 30% on the echo on 10/07/2016. - Worsening symptoms of fatigue and shortness of breath back to pre-CRT-D time. NYHA III. -- started on Entresto 26/24 po BID on 11/07/16 and Spironolactone with mild improvement of symptoms however still NYHA III - s/p CRT placement - [employer] by Dr Graciela Husbands on 01/17/2017, significant improvement of symptoms, currently NYHA II a - She is euvolemic - continue the same therapy,  - PM insertion site with hematoma, but no discharge, no fever - Follow up in 4 weeks with labs - repeat TTE in April  2. CRT - s/p device extraction in 08/2016 with recurrent MSSA bacteremia and vegetation - reimplantation of CRT on 01/17/2017 - On antibiotics long-term, possibly life long, followed by Dr Synthia Innocent  3. HTN - Controlled.  Disposition: Follow up in 4 weeks. Current medicines are reviewed at length with the patient today.  The patient did not have any concerns regarding medicines.  Signed, Tobias Alexander, MD 01/22/2017 12:30 PM

## 2017-01-22 NOTE — Patient Instructions (Signed)
Medication Instructions:   Your physician recommends that you continue on your current medications as directed. Please refer to the Current Medication list given to you today.    Follow-Up:  ADD TO DR. Lindaann Slough SCHEDULE END SLOT FOR 02/24/17, PER DR Delton See       If you need a refill on your cardiac medications before your next appointment, please call your pharmacy.

## 2017-01-27 ENCOUNTER — Encounter: Payer: Self-pay | Admitting: Infectious Disease

## 2017-01-27 ENCOUNTER — Ambulatory Visit (INDEPENDENT_AMBULATORY_CARE_PROVIDER_SITE_OTHER): Payer: Medicare Other | Admitting: Infectious Disease

## 2017-01-27 VITALS — BP 133/78 | HR 73 | Temp 97.6°F | Ht <= 58 in | Wt 160.0 lb

## 2017-01-27 DIAGNOSIS — R7881 Bacteremia: Secondary | ICD-10-CM

## 2017-01-27 DIAGNOSIS — T8450XD Infection and inflammatory reaction due to unspecified internal joint prosthesis, subsequent encounter: Secondary | ICD-10-CM

## 2017-01-27 DIAGNOSIS — T827XXA Infection and inflammatory reaction due to other cardiac and vascular devices, implants and grafts, initial encounter: Secondary | ICD-10-CM | POA: Diagnosis present

## 2017-01-27 NOTE — Progress Notes (Signed)
Subjective:   Follow-up for her MSSA device and prosthetic joint infections   Patient ID: [name] [name], female    DOB: 08/18/1935, 82 y.o.   MRN: [id number]  HPI  82 year old who underwent lumbar laminectomy by Dr. Yetta Barre on 05/03/16. Presented to [facility] 6/7 with fevers, weakness and diffuse pain. She developed sepsis/hypotension and was found to have MSSA bacteremia and transferred to [facility] 6/15 for continued care and assessment by EP team considering she has cardiac device. At [facility] she underwent evaluation for metastatic sites of infection including CT of spine, TTE/TEE and BCx monitoring. All repeat BCx were negative once transferred here. TTE/TEE were both negative for lead or valvular endocarditis at that time; received 4 weeks IV Ancef and decision to leave cardiac device.  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 She underwent single staged exchange arthroplasty on right knee by Dr. Veda Canning. Dr. Ladona Ridgel was not available for device extraction here so she was transferred to [facility] where she had cardiac device extracted on August 10th. She had blood cultures and device cultures done with no growth. PICC inserted on 08/18/16. She was sent out on IV cefazolin 2g IV q 8 but also IV rifampin 300 mg IV q 12 at SNF and then home. Managing FIVE infusions was overwhelming for her husband. I changed her to rifampin 300mg  orally BID and then Ceftriaxone 2gram IV push to complete 42 days of therapy post extraction. Initially her cardiac function had mproved quite nicely. We placed her on high dose keflex and bid rifampin and she has been on these meds without fail since I last saw her. She is approaching in December 4 months of systemic antimicrobials since extraction of her device and a litle more since I and D and exchange of  liner of prosthesis.   Her EF has worsened and EP and Cardiology after careful consideration we implanted a biventricular pacemaker on January 17, 2017.  She is continued on her cephalexin and rifampin.  She has had no adverse side effects from these medications.  Recent lab work done in cardiology showed normal kidney function and CBC liver function was normal when checked several months ago.  She says that she still has pain in her knee when she bears weight at times but not typically at rest.  Her pacemaker pocket is healing up nicely.     Past Medical History:  Diagnosis Date  . 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 Arthritis    "finger joints" (06/21/2016)  . Asthma   . CHF (congestive heart failure) (HCC)   . Congestive dilated cardiomyopathy (HCC) 03/31/2014   a. s/p Biotronik CRTD 06/2014  . GERD (gastroesophageal reflux disease)   . History of blood transfusion 1990s   "related to hysterectomy"  . Hypothyroidism   . Left bundle branch block   . MSSA bacteremia 06/21/2016  . Myocardial infarction (HCC)   . Pancreatitis, acute     Past Surgical History:  Procedure Laterality Date  . ABDOMINAL HYSTERECTOMY    . APPENDECTOMY    . BIV PACEMAKER INSERTION CRT-P N/A 01/17/2017   Procedure: BIV PACEMAKER INSERTION CRT-P;  Surgeon: Duke Salvia, MD;  Location: [facility] INVASIVE CV LAB;  Service: Cardiovascular;  Laterality:  N/A;  . CARDIAC CATHETERIZATION    . CARDIAC DEFIBRILLATOR PLACEMENT  ?2016  . CATARACT EXTRACTION W/ INTRAOCULAR LENS  IMPLANT, BILATERAL Bilateral   . CHOLECYSTECTOMY OPEN     "related to pancreatitis"  . DILATION AND CURETTAGE OF UTERUS    . EP IMPLANTABLE DEVICE N/A 07/04/2014   Procedure: BiV ICD Insertion CRT-D;  Surgeon: Duke Salvia, MD;  Location: [facility] INVASIVE CV LAB;  Service: Cardiovascular;  Laterality: N/A;  . HERNIA REPAIR    . I&D KNEE WITH POLY EXCHANGE  Right 08/11/2016   Procedure: IRRIGATION AND DEBRIDEMENT KNEE WITH POLY EXCHANGE;  Surgeon: Samson Frederic, MD;  Location: [facility] OR;  Service: Orthopedics;  Laterality: Right;  . INSERT / REPLACE / REMOVE PACEMAKER  ?2016  . JOINT REPLACEMENT Bilateral    "thumb joints; used my own material"  . JOINT REPLACEMENT    . LEFT HEART CATHETERIZATION WITH CORONARY ANGIOGRAM N/A 11/02/2013   Procedure: LEFT HEART CATHETERIZATION WITH CORONARY ANGIOGRAM;  Surgeon: Corky Crafts, MD;  Location: [facility] CATH LAB;  Service: Cardiovascular;  Laterality: N/A;  . LOOP RECORDER IMPLANT N/A 04/13/2014   Procedure: LOOP RECORDER IMPLANT;  Surgeon: Duke Salvia, MD;  Location: [facility] CATH LAB;  Service: Cardiovascular;  Laterality: N/A;  . LOOP RECORDER REMOVAL  ?2016  . POSTERIOR LUMBAR FUSION  05/03/2016   Lumbar Two-Three, Lumbar Three-Four Posterior Lumbar Fusion withLaminectomy and Foraminotomy  . SPINE SURGERY  04/2016   lumbar   . TEE WITHOUT CARDIOVERSION N/A 08/14/2016   Procedure: TRANSESOPHAGEAL ECHOCARDIOGRAM (TEE);  Surgeon: Lars Masson, MD;  Location: [facility] ENDOSCOPY;  Service: Cardiovascular;  Laterality: N/A;  . TONSILLECTOMY    . TOTAL HIP ARTHROPLASTY Bilateral   . UMBILICAL HERNIA REPAIR  1970s?    Family History  Problem Relation Age of Onset  . Hyperlipidemia Mother   . Arrhythmia Mother        palpitations  . Heart disease Father   . Heart attack Father   . Heart attack Maternal Grandfather   . Heart attack Paternal Grandfather       Social History   Socioeconomic History  . Marital status: Married    Spouse name: None  . Number of children: None  . Years of education: None  . Highest education level: None  Social Needs  . Financial resource strain: None  . Food insecurity - worry: None  . Food insecurity - inability: None  . Transportation needs - medical: None  . Transportation needs - non-medical: None  Occupational History  . None  Tobacco Use  . Smoking status:  Never Smoker  . Smokeless tobacco: Never Used  . Tobacco comment: "smoked as a teenager; for maybe 1 wk"  Substance and Sexual Activity  . Alcohol use: Yes    Comment: 6/15//2018 "glass of wine q 6 months or so"  . Drug use: No  . Sexual activity: None  Other Topics Concern  . None  Social History Narrative  . None    Allergies  Allergen Reactions  . Sulfa Antibiotics Hives     Current Outpatient Medications:  .  albuterol (PROAIR HFA) 108 (90 Base) MCG/ACT inhaler, Inhale 2 puffs into the lungs every 6 (six) hours as needed for wheezing or shortness of breath., Disp: , Rfl:  .  aspirin EC 81 MG tablet, Take 81 mg by mouth daily., Disp: , Rfl:  .  aspirin-acetaminophen-caffeine (EXCEDRIN MIGRAINE) 250-250-65 MG per tablet, Take 2 tablets by mouth every 8 (eight) hours as needed  for headache or migraine. , Disp: , Rfl:  .  augmented betamethasone dipropionate (DIPROLENE-AF) 0.05 % ointment, Apply 1 application topically daily as needed (acrodermatitis [place] on fingers/toes). , Disp: , Rfl:  .  betamethasone dipropionate (DIPROLENE) 0.05 % ointment, Apply 1 application topically daily as needed., Disp: , Rfl:  .  Calcium Carbonate Antacid (TUMS PO), Take 1 tablet by mouth daily as needed (indigestion). , Disp: , Rfl:  .  carvedilol (COREG) 12.5 MG tablet, Take 1.5 tablets (18.75 mg total) 2 (two) times daily by mouth., Disp: 180 tablet, Rfl: 3 .  cephALEXin (KEFLEX) 500 MG capsule, Take 1 capsule (500 mg total) by mouth 4 (four) times daily., Disp: 120 capsule, Rfl: 11 .  diphenhydrAMINE (BENADRYL) 12.5 MG/5ML elixir, Take 5-10 mLs (12.5-25 mg total) by mouth every 4 (four) hours as needed for itching., Disp: 120 mL, Rfl: 0 .  docusate sodium (COLACE) 100 MG capsule, Take 1 capsule (100 mg total) by mouth 2 (two) times daily. (Patient taking differently: Take 100 mg by mouth daily as needed for mild constipation. ), Disp: 10 capsule, Rfl: 0 .  feeding supplement, ENSURE ENLIVE,  (ENSURE ENLIVE) LIQD, Take 237 mLs by mouth 2 (two) times daily between meals. (Patient taking differently: Take 237 mLs by mouth daily. ), Disp: 237 mL, Rfl: 12 .  furosemide (LASIX) 20 MG tablet, Take 1 tablet (20 mg total) by mouth daily. (Patient taking differently: Take 20 mg by mouth daily as needed for fluid. ), Disp: 90 tablet, Rfl: 3 .  levothyroxine (SYNTHROID, LEVOTHROID) 112 MCG tablet, Take 112 mcg by mouth daily before breakfast., Disp: , Rfl:  .  Magnesium 200 MG TABS, Take 200 mg by mouth daily., Disp: , Rfl:  .  Melatonin 5 MG TABS, Take 5 mg by mouth at bedtime as needed (for sleep.). , Disp: , Rfl:  .  menthol-cetylpyridinium (CEPACOL) 3 MG lozenge, Take 1 lozenge (3 mg total) by mouth as needed for sore throat (sore throat)., Disp: 100 tablet, Rfl: 12 .  methocarbamol (ROBAXIN) 500 MG tablet, Take 1 tablet (500 mg total) by mouth every 6 (six) hours as needed for muscle spasms., Disp: 10 tablet, Rfl: 0 .  methylphenidate (RITALIN) 20 MG tablet, Take 20 mg by mouth daily as needed (narcolepsy). , Disp: , Rfl:  .  metoCLOPramide (REGLAN) 5 MG tablet, Take 1-2 tablets (5-10 mg total) by mouth every 8 (eight) hours as needed for nausea (if ondansetron (ZOFRAN) ineffective.)., Disp: 10 tablet, Rfl: 0 .  Naphazoline-Pheniramine (ALLERGY EYE OP), Place 1-2 drops into both eyes daily as needed (for irritated eyes)., Disp: , Rfl:  .  nitroGLYCERIN (NITROSTAT) 0.4 MG SL tablet, Place 1 tablet (0.4 mg total) every 5 (five) minutes as needed under the tongue for chest pain., Disp: 90 tablet, Rfl: 3 .  ondansetron (ZOFRAN) 4 MG tablet, Take 1 tablet (4 mg total) by mouth every 6 (six) hours as needed for nausea., Disp: 20 tablet, Rfl: 0 .  polyethylene glycol (MIRALAX / GLYCOLAX) packet, Take 17 g by mouth daily as needed for mild constipation., Disp: 14 each, Rfl: 0 .  Probiotic Product (PROBIOTIC PO), Take 1 capsule by mouth every other day. , Disp: , Rfl:  .  rifampin (RIFADIN) 300 MG  capsule, TAKE 1 CAPSULE(300 MG) BY MOUTH EVERY 12 HOURS, Disp: 60 capsule, Rfl: 0 .  sacubitril-valsartan (ENTRESTO) 24-26 MG, Take 1 tablet by mouth 2 (two) times daily., Disp: 180 tablet, Rfl: 2 .  senna (SENOKOT) 8.6 MG  TABS tablet, Take 2 tablets (17.2 mg total) by mouth at bedtime., Disp: 120 each, Rfl: 0 .  spironolactone (ALDACTONE) 25 MG tablet, Take 0.5 tablets (12.5 mg total) by mouth daily., Disp: 90 tablet, Rfl: 3 .  alum & mag hydroxide-simeth (MAALOX/MYLANTA) 200-200-20 MG/5ML suspension, Take 30 mLs by mouth every 4 (four) hours as needed for indigestion. (Patient not taking: Reported on 01/27/2017), Disp: 355 mL, Rfl: 0   Review of Systems  Constitutional: Negative for chills and fever.  HENT: Negative for congestion and sore throat.   Eyes: Negative for photophobia.  Respiratory: Negative for cough, shortness of breath and wheezing.   Cardiovascular: Negative for chest pain, palpitations and leg swelling.  Gastrointestinal: Negative for abdominal pain, blood in stool, constipation, diarrhea, nausea and vomiting.  Genitourinary: Negative for dysuria, flank pain and hematuria.  Musculoskeletal: Positive for arthralgias, back pain and myalgias. Negative for neck pain.  Skin: Positive for wound. Negative for rash.  Neurological: Negative for dizziness, weakness and headaches.  Hematological: Does not bruise/bleed easily.  Psychiatric/Behavioral: Negative for suicidal ideas.       Objective:   Physical Exam  Constitutional: She is oriented to person, place, and time. She appears well-developed and well-nourished. No distress.  HENT:  Head: Normocephalic and atraumatic.  Mouth/Throat: No oropharyngeal exudate.  Eyes: Conjunctivae and EOM are normal. No scleral icterus.  Neck: Normal range of motion. Neck supple.  Cardiovascular: Normal rate and regular rhythm.  Murmur heard.  Systolic murmur is present. RUSB  Pulmonary/Chest: Effort normal. No respiratory distress. She  has no wheezes.    Abdominal: She exhibits no distension.  Musculoskeletal: She exhibits no edema or tenderness.       Legs: Neurological: She is alert and oriented to person, place, and time. She exhibits normal muscle tone. Coordination normal.  Skin: Skin is warm and dry. No rash noted. She is not diaphoretic. No pallor.  Psychiatric: She has a normal mood and affect. Her behavior is normal. Judgment and thought content normal.  Nursing note and vitals reviewed.  New pacemaker generator site January 27, 2017:    Right prosthetic knee joint surgical scar and joint 01/27/2017:          Assessment & Plan:    ICD infection: sp extraction and she will have completed 42 days of systemic therapy post extraction and has been on oral abx for her PJI since then space more than 4 months of antimicrobial therapy post extraction now status post reimplantation due to worsening heart failure in the absence of a device she now has a biventricular device in place again.  We will continue out with likely lifelong antimicrobial therapy  IPJI, the standard effort is to get through 6 months of therapy though in many where risk of failure is high I personally continue on for much longer. With re to timing of her device re-implantation one could try to delay it until she had a full 6 months of therapy for the knee hoping that the risk for recurrence in the knee would then be lower and hence risk for bacteremia. However I would NOT be held hostage by such a concept. If she URGENTLY needs device I would support reimplantation. My intention is to keep her on antibiotics for years if not rest of her life due to my concerns of relapse int he knee and more importantly risk for bacteremia yet again.   PJI sp single staged procedure; higher rate of failure with a 2 step. We will give  her protracted oral therapy as stated above likely lifelong  Check labs in a few months including inflammatory markers

## 2017-01-29 ENCOUNTER — Ambulatory Visit (INDEPENDENT_AMBULATORY_CARE_PROVIDER_SITE_OTHER): Payer: Medicare Other | Admitting: *Deleted

## 2017-01-29 DIAGNOSIS — I5022 Chronic systolic (congestive) heart failure: Secondary | ICD-10-CM | POA: Diagnosis not present

## 2017-01-29 DIAGNOSIS — Z950 Presence of cardiac pacemaker: Secondary | ICD-10-CM

## 2017-01-29 LAB — CUP PACEART INCLINIC DEVICE CHECK
Battery Voltage: 3.15 V
Brady Statistic AP VP Percent: 6.05 %
Brady Statistic AP VS Percent: 0.14 %
Brady Statistic AS VP Percent: 92.09 %
Brady Statistic RA Percent Paced: 6.3 %
Date Time Interrogation Session: 20190123155956
Implantable Lead Implant Date: 20190111
Implantable Lead Implant Date: 20190111
Implantable Lead Location: 753860
Implantable Lead Model: 5076
Lead Channel Impedance Value: 1083 Ohm
Lead Channel Impedance Value: 1311 Ohm
Lead Channel Impedance Value: 1349 Ohm
Lead Channel Impedance Value: 1748 Ohm
Lead Channel Impedance Value: 437 Ohm
Lead Channel Impedance Value: 513 Ohm
Lead Channel Pacing Threshold Amplitude: 0.5 V
Lead Channel Pacing Threshold Pulse Width: 0.4 ms
Lead Channel Setting Pacing Amplitude: 3.5 V
Lead Channel Setting Pacing Amplitude: 3.5 V
Lead Channel Setting Pacing Pulse Width: 0.4 ms
Lead Channel Setting Pacing Pulse Width: 1 ms
Lead Channel Setting Sensing Sensitivity: 0.9 mV
MDC IDC LEAD IMPLANT DT: 20190111
MDC IDC LEAD LOCATION: 753858
MDC IDC LEAD LOCATION: 753859
MDC IDC MSMT BATTERY REMAINING LONGEVITY: 92 mo
MDC IDC MSMT LEADCHNL LV IMPEDANCE VALUE: 1463 Ohm
MDC IDC MSMT LEADCHNL LV IMPEDANCE VALUE: 1539 Ohm
MDC IDC MSMT LEADCHNL LV PACING THRESHOLD AMPLITUDE: 2.5 V
MDC IDC MSMT LEADCHNL LV PACING THRESHOLD PULSEWIDTH: 1 ms
MDC IDC MSMT LEADCHNL RA IMPEDANCE VALUE: 418 Ohm
MDC IDC MSMT LEADCHNL RA SENSING INTR AMPL: 1.375 mV
MDC IDC MSMT LEADCHNL RV IMPEDANCE VALUE: 342 Ohm
MDC IDC MSMT LEADCHNL RV PACING THRESHOLD AMPLITUDE: 0.75 V
MDC IDC MSMT LEADCHNL RV PACING THRESHOLD PULSEWIDTH: 0.4 ms
MDC IDC MSMT LEADCHNL RV SENSING INTR AMPL: 19.25 mV
MDC IDC PG IMPLANT DT: 20190111
MDC IDC SET LEADCHNL LV PACING AMPLITUDE: 4 V
MDC IDC STAT BRADY AS VS PERCENT: 1.72 %
MDC IDC STAT BRADY RV PERCENT PACED: 5.4 %

## 2017-01-29 NOTE — Progress Notes (Signed)
Wound check appointment. Steri-strips removed. Wound without redness or ecchmosis. Mild edema noted over pocket, soft to palpation. Incision edges approximated, wound well healed. Normal device function. Thresholds, sensing, and impedances consistent with implant measurements. Device programmed at 3.5V (RA and RV) with auto capture programmed on (LV output fixed at 4.0V, monitor only) for extra safety margin until 3 month visit. Histogram distribution appropriate for patient and level of activity. BiV pacing 98.1%. No mode switches or high ventricular rates noted. Patient educated about wound care, arm mobility, lifting restrictions, and Carelink monitor. ROV with SK on 04/21/17.

## 2017-02-24 ENCOUNTER — Ambulatory Visit: Payer: Medicare Other | Admitting: Cardiology

## 2017-04-21 ENCOUNTER — Encounter: Payer: Medicare Other | Admitting: Internal Medicine

## 2017-05-06 ENCOUNTER — Encounter: Payer: Self-pay | Admitting: Internal Medicine

## 2017-05-06 ENCOUNTER — Emergency Department (HOSPITAL_COMMUNITY): Payer: Medicare Other

## 2017-05-06 ENCOUNTER — Encounter (HOSPITAL_COMMUNITY): Payer: Self-pay | Admitting: Emergency Medicine

## 2017-05-06 ENCOUNTER — Ambulatory Visit (INDEPENDENT_AMBULATORY_CARE_PROVIDER_SITE_OTHER): Payer: Medicare Other | Admitting: Internal Medicine

## 2017-05-06 ENCOUNTER — Other Ambulatory Visit: Payer: Self-pay

## 2017-05-06 ENCOUNTER — Inpatient Hospital Stay (HOSPITAL_COMMUNITY)
Admission: EM | Admit: 2017-05-06 | Discharge: 2017-05-10 | DRG: 378 | Disposition: A | Discharge status: Home / Self Care | Payer: Medicare Other | Attending: Internal Medicine | Admitting: Internal Medicine

## 2017-05-06 VITALS — BP 104/56 | HR 96 | Ht <= 58 in

## 2017-05-06 DIAGNOSIS — I951 Orthostatic hypotension: Secondary | ICD-10-CM

## 2017-05-06 DIAGNOSIS — K922 Gastrointestinal hemorrhage, unspecified: Secondary | ICD-10-CM | POA: Insufficient documentation

## 2017-05-06 DIAGNOSIS — R1310 Dysphagia, unspecified: Secondary | ICD-10-CM | POA: Diagnosis present

## 2017-05-06 DIAGNOSIS — Z96643 Presence of artificial hip joint, bilateral: Secondary | ICD-10-CM | POA: Diagnosis present

## 2017-05-06 DIAGNOSIS — K210 Gastro-esophageal reflux disease with esophagitis: Secondary | ICD-10-CM | POA: Diagnosis present

## 2017-05-06 DIAGNOSIS — R072 Precordial pain: Secondary | ICD-10-CM

## 2017-05-06 DIAGNOSIS — I5022 Chronic systolic (congestive) heart failure: Secondary | ICD-10-CM

## 2017-05-06 DIAGNOSIS — Z9842 Cataract extraction status, left eye: Secondary | ICD-10-CM

## 2017-05-06 DIAGNOSIS — I447 Left bundle-branch block, unspecified: Secondary | ICD-10-CM | POA: Diagnosis present

## 2017-05-06 DIAGNOSIS — D649 Anemia, unspecified: Secondary | ICD-10-CM

## 2017-05-06 DIAGNOSIS — I420 Dilated cardiomyopathy: Secondary | ICD-10-CM

## 2017-05-06 DIAGNOSIS — Z90710 Acquired absence of both cervix and uterus: Secondary | ICD-10-CM

## 2017-05-06 DIAGNOSIS — Z950 Presence of cardiac pacemaker: Secondary | ICD-10-CM

## 2017-05-06 DIAGNOSIS — Z79890 Hormone replacement therapy (postmenopausal): Secondary | ICD-10-CM

## 2017-05-06 DIAGNOSIS — R0602 Shortness of breath: Secondary | ICD-10-CM | POA: Diagnosis not present

## 2017-05-06 DIAGNOSIS — R002 Palpitations: Secondary | ICD-10-CM

## 2017-05-06 DIAGNOSIS — J45909 Unspecified asthma, uncomplicated: Secondary | ICD-10-CM | POA: Diagnosis present

## 2017-05-06 DIAGNOSIS — K264 Chronic or unspecified duodenal ulcer with hemorrhage: Principal | ICD-10-CM | POA: Diagnosis present

## 2017-05-06 DIAGNOSIS — Z95810 Presence of automatic (implantable) cardiac defibrillator: Secondary | ICD-10-CM

## 2017-05-06 DIAGNOSIS — Z7982 Long term (current) use of aspirin: Secondary | ICD-10-CM

## 2017-05-06 DIAGNOSIS — Z79899 Other long term (current) drug therapy: Secondary | ICD-10-CM

## 2017-05-06 DIAGNOSIS — K2970 Gastritis, unspecified, without bleeding: Secondary | ICD-10-CM | POA: Diagnosis present

## 2017-05-06 DIAGNOSIS — Z961 Presence of intraocular lens: Secondary | ICD-10-CM | POA: Diagnosis present

## 2017-05-06 DIAGNOSIS — R55 Syncope and collapse: Secondary | ICD-10-CM | POA: Insufficient documentation

## 2017-05-06 DIAGNOSIS — I252 Old myocardial infarction: Secondary | ICD-10-CM

## 2017-05-06 DIAGNOSIS — L304 Erythema intertrigo: Secondary | ICD-10-CM | POA: Diagnosis present

## 2017-05-06 DIAGNOSIS — R109 Unspecified abdominal pain: Secondary | ICD-10-CM

## 2017-05-06 DIAGNOSIS — W19XXXA Unspecified fall, initial encounter: Secondary | ICD-10-CM | POA: Diagnosis present

## 2017-05-06 DIAGNOSIS — D62 Acute posthemorrhagic anemia: Secondary | ICD-10-CM | POA: Diagnosis present

## 2017-05-06 DIAGNOSIS — I428 Other cardiomyopathies: Secondary | ICD-10-CM | POA: Diagnosis not present

## 2017-05-06 DIAGNOSIS — E869 Volume depletion, unspecified: Secondary | ICD-10-CM | POA: Diagnosis present

## 2017-05-06 DIAGNOSIS — I5042 Chronic combined systolic (congestive) and diastolic (congestive) heart failure: Secondary | ICD-10-CM | POA: Diagnosis present

## 2017-05-06 DIAGNOSIS — R079 Chest pain, unspecified: Secondary | ICD-10-CM

## 2017-05-06 DIAGNOSIS — E039 Hypothyroidism, unspecified: Secondary | ICD-10-CM | POA: Diagnosis present

## 2017-05-06 DIAGNOSIS — D509 Iron deficiency anemia, unspecified: Secondary | ICD-10-CM | POA: Diagnosis present

## 2017-05-06 DIAGNOSIS — Z9841 Cataract extraction status, right eye: Secondary | ICD-10-CM

## 2017-05-06 HISTORY — DX: Gastrointestinal hemorrhage, unspecified: K92.2

## 2017-05-06 HISTORY — DX: Headache, unspecified: R51.9

## 2017-05-06 HISTORY — DX: Anemia, unspecified: D64.9

## 2017-05-06 HISTORY — DX: Headache: R51

## 2017-05-06 LAB — CBC WITH DIFFERENTIAL/PLATELET
Basophils Absolute: 0 10*3/uL (ref 0.0–0.1)
Basophils Relative: 1 %
Eosinophils Absolute: 0.5 10*3/uL (ref 0.0–0.7)
Eosinophils Relative: 8 %
HCT: 23.5 % — ABNORMAL LOW (ref 36.0–46.0)
Hemoglobin: 7.9 g/dL — ABNORMAL LOW (ref 12.0–15.0)
Lymphocytes Relative: 13 %
Lymphs Abs: 0.9 10*3/uL (ref 0.7–4.0)
MCH: 33.1 pg (ref 26.0–34.0)
MCHC: 33.6 g/dL (ref 30.0–36.0)
MCV: 98.3 fL (ref 78.0–100.0)
Monocytes Absolute: 0.3 10*3/uL (ref 0.1–1.0)
Monocytes Relative: 5 %
Neutro Abs: 4.8 10*3/uL (ref 1.7–7.7)
Neutrophils Relative %: 73 %
Platelets: 132 10*3/uL — ABNORMAL LOW (ref 150–400)
RBC: 2.39 MIL/uL — ABNORMAL LOW (ref 3.87–5.11)
RDW: 14.1 % (ref 11.5–15.5)
WBC: 6.5 10*3/uL (ref 4.0–10.5)

## 2017-05-06 LAB — CUP PACEART INCLINIC DEVICE CHECK
Battery Remaining Longevity: 82 mo
Battery Voltage: 3.07 V
Brady Statistic AP VP Percent: 5.82 %
Brady Statistic AP VS Percent: 0.14 %
Brady Statistic AS VP Percent: 92.25 %
Brady Statistic AS VS Percent: 1.78 %
Brady Statistic RA Percent Paced: 6.03 %
Brady Statistic RV Percent Paced: 6.26 %
Date Time Interrogation Session: 20190430162421
Implantable Lead Implant Date: 20190111
Implantable Lead Implant Date: 20190111
Implantable Lead Implant Date: 20190111
Implantable Lead Location: 753858
Implantable Lead Location: 753859
Implantable Lead Location: 753860
Implantable Lead Model: 5076
Implantable Lead Model: 5076
Implantable Pulse Generator Implant Date: 20190111
Lead Channel Impedance Value: 1197 Ohm
Lead Channel Impedance Value: 1235 Ohm
Lead Channel Impedance Value: 1292 Ohm
Lead Channel Impedance Value: 1330 Ohm
Lead Channel Impedance Value: 1330 Ohm
Lead Channel Impedance Value: 1368 Ohm
Lead Channel Impedance Value: 361 Ohm
Lead Channel Impedance Value: 361 Ohm
Lead Channel Impedance Value: 456 Ohm
Lead Channel Impedance Value: 627 Ohm
Lead Channel Pacing Threshold Amplitude: 0.75 V
Lead Channel Pacing Threshold Amplitude: 0.875 V
Lead Channel Pacing Threshold Amplitude: 4 V
Lead Channel Pacing Threshold Pulse Width: 0.4 ms
Lead Channel Pacing Threshold Pulse Width: 0.4 ms
Lead Channel Pacing Threshold Pulse Width: 1 ms
Lead Channel Sensing Intrinsic Amplitude: 0.875 mV
Lead Channel Sensing Intrinsic Amplitude: 1.625 mV
Lead Channel Sensing Intrinsic Amplitude: 15.625 mV
Lead Channel Sensing Intrinsic Amplitude: 20.125 mV
Lead Channel Setting Pacing Amplitude: 2 V
Lead Channel Setting Pacing Amplitude: 2.5 V
Lead Channel Setting Pacing Amplitude: 4.5 V
Lead Channel Setting Pacing Pulse Width: 0.4 ms
Lead Channel Setting Pacing Pulse Width: 1 ms
Lead Channel Setting Sensing Sensitivity: 0.9 mV

## 2017-05-06 LAB — COMPREHENSIVE METABOLIC PANEL
ALT: 11 U/L — ABNORMAL LOW (ref 14–54)
AST: 18 U/L (ref 15–41)
Albumin: 3.2 g/dL — ABNORMAL LOW (ref 3.5–5.0)
Alkaline Phosphatase: 39 U/L (ref 38–126)
Anion gap: 7 (ref 5–15)
BUN: 43 mg/dL — ABNORMAL HIGH (ref 6–20)
CO2: 24 mmol/L (ref 22–32)
Calcium: 8.6 mg/dL — ABNORMAL LOW (ref 8.9–10.3)
Chloride: 109 mmol/L (ref 101–111)
Creatinine, Ser: 1.16 mg/dL — ABNORMAL HIGH (ref 0.44–1.00)
GFR calc Af Amer: 51 mL/min — ABNORMAL LOW (ref >60)
GFR calc non Af Amer: 44 mL/min — ABNORMAL LOW (ref >60)
Glucose, Bld: 105 mg/dL — ABNORMAL HIGH (ref 65–99)
Potassium: 4 mmol/L (ref 3.5–5.1)
Sodium: 140 mmol/L (ref 135–145)
Total Bilirubin: 0.4 mg/dL (ref 0.3–1.2)
Total Protein: 5.3 g/dL — ABNORMAL LOW (ref 6.5–8.1)

## 2017-05-06 LAB — IRON AND TIBC
Iron: 95 ug/dL (ref 28–170)
Saturation Ratios: 36 % — ABNORMAL HIGH (ref 10.4–31.8)
TIBC: 260 ug/dL (ref 250–450)
UIBC: 165 ug/dL

## 2017-05-06 LAB — URINALYSIS, ROUTINE W REFLEX MICROSCOPIC
Bilirubin Urine: NEGATIVE
Glucose, UA: NEGATIVE mg/dL
Hgb urine dipstick: NEGATIVE
KETONES UR: NEGATIVE mg/dL
LEUKOCYTES UA: NEGATIVE
NITRITE: NEGATIVE
PH: 7 (ref 5.0–8.0)
Protein, ur: NEGATIVE mg/dL
Specific Gravity, Urine: 1.017 (ref 1.005–1.030)

## 2017-05-06 LAB — LIPID PANEL
CHOL/HDL RATIO: 4 ratio
Cholesterol: 171 mg/dL (ref 0–200)
HDL: 43 mg/dL (ref >40)
LDL Cholesterol: 103 mg/dL — ABNORMAL HIGH (ref 0–99)
TRIGLYCERIDES: 127 mg/dL (ref <150)
VLDL: 25 mg/dL (ref 0–40)

## 2017-05-06 LAB — I-STAT TROPONIN, ED: Troponin i, poc: 0.01 ng/mL (ref 0.00–0.08)

## 2017-05-06 LAB — TROPONIN I: Troponin I: <0.03 ng/mL (ref <0.03)

## 2017-05-06 LAB — RETICULOCYTES
RBC.: 2.44 MIL/uL — ABNORMAL LOW (ref 3.87–5.11)
RETIC CT PCT: 2.1 % (ref 0.4–3.1)
Retic Count, Absolute: 51.2 10*3/uL (ref 19.0–186.0)

## 2017-05-06 LAB — FOLATE: Folate: 7.6 ng/mL (ref >5.9)

## 2017-05-06 LAB — FERRITIN: Ferritin: 18 ng/mL (ref 11–307)

## 2017-05-06 LAB — APTT: APTT: 31 s (ref 24–36)

## 2017-05-06 LAB — VITAMIN B12: VITAMIN B 12: 227 pg/mL (ref 180–914)

## 2017-05-06 LAB — PROTIME-INR
INR: 1.11
Prothrombin Time: 14.2 seconds (ref 11.4–15.2)

## 2017-05-06 LAB — D-DIMER, QUANTITATIVE: D-Dimer, Quant: 0.62 ug/mL-FEU — ABNORMAL HIGH (ref 0.00–0.50)

## 2017-05-06 LAB — POC OCCULT BLOOD, ED: Fecal Occult Bld: NEGATIVE

## 2017-05-06 LAB — PREPARE RBC (CROSSMATCH)

## 2017-05-06 MED ORDER — ASPIRIN 81 MG PO CHEW
324.0000 mg | CHEWABLE_TABLET | Freq: Once | ORAL | Status: AC
  Administered 2017-05-06: 324 mg via ORAL
  Filled 2017-05-06: qty 4

## 2017-05-06 MED ORDER — LACTATED RINGERS IV SOLN
INTRAVENOUS | Status: DC
  Administered 2017-05-06 – 2017-05-08 (×3): via INTRAVENOUS

## 2017-05-06 MED ORDER — SODIUM CHLORIDE 0.9 % IV SOLN
Freq: Once | INTRAVENOUS | Status: AC
  Administered 2017-05-06: 19:00:00 via INTRAVENOUS

## 2017-05-06 MED ORDER — LACTATED RINGERS IV BOLUS
1000.0000 mL | Freq: Once | INTRAVENOUS | Status: AC
  Administered 2017-05-06: 1000 mL via INTRAVENOUS

## 2017-05-06 MED ORDER — ACETAMINOPHEN 325 MG PO TABS
650.0000 mg | ORAL_TABLET | Freq: Four times a day (QID) | ORAL | Status: DC | PRN
  Administered 2017-05-07 – 2017-05-09 (×3): 650 mg via ORAL
  Filled 2017-05-06 (×3): qty 2

## 2017-05-06 MED ORDER — FENTANYL CITRATE (PF) 100 MCG/2ML IJ SOLN
50.0000 ug | Freq: Once | INTRAMUSCULAR | Status: AC
  Administered 2017-05-06: 50 ug via INTRAVENOUS
  Filled 2017-05-06: qty 2

## 2017-05-06 MED ORDER — NITROGLYCERIN 0.4 MG SL SUBL: 0.4000 mg | SUBLINGUAL_TABLET | SUBLINGUAL | Status: DC | PRN

## 2017-05-06 MED ORDER — RIFAMPIN 600 MG IV SOLR
300.0000 mg | Freq: Two times a day (BID) | INTRAVENOUS | Status: DC
  Administered 2017-05-06 – 2017-05-08 (×5): 300 mg via INTRAVENOUS
  Filled 2017-05-06 (×10): qty 300

## 2017-05-06 MED ORDER — SODIUM CHLORIDE 0.9 % IV SOLN
INTRAVENOUS | Status: DC
  Administered 2017-05-06: 18:00:00 via INTRAVENOUS

## 2017-05-06 MED ORDER — ACETAMINOPHEN 650 MG RE SUPP: 650.0000 mg | Freq: Four times a day (QID) | RECTAL | Status: DC | PRN

## 2017-05-06 MED ORDER — NAPHAZOLINE-PHENIRAMINE 0.025-0.3 % OP SOLN
1.0000 [drp] | Freq: Four times a day (QID) | OPHTHALMIC | Status: DC | PRN
  Filled 2017-05-06: qty 5

## 2017-05-06 MED ORDER — FAMOTIDINE IN NACL 20-0.9 MG/50ML-% IV SOLN
20.0000 mg | Freq: Two times a day (BID) | INTRAVENOUS | Status: DC
  Administered 2017-05-06: 20 mg via INTRAVENOUS
  Filled 2017-05-06: qty 50

## 2017-05-06 MED ORDER — SODIUM CHLORIDE 0.9 % IV SOLN
2.0000 g | INTRAVENOUS | Status: DC
  Administered 2017-05-06 – 2017-05-08 (×3): 2 g via INTRAVENOUS
  Filled 2017-05-06 (×3): qty 20

## 2017-05-06 NOTE — ED Notes (Signed)
Blood bank called this RN, to inform that the pt's  Antibody screen for blood was positive and will take some extra time.  Blood bank with call RN when blood is ready.

## 2017-05-06 NOTE — ED Provider Notes (Signed)
Emergency Department Provider Note   I have reviewed the triage vital signs and the nursing notes.   HISTORY  Chief Complaint Chest Pain and Loss of Consciousness   HPI [name] [name] is a 82 y.o. female with multiple medical problems as documented below the presents the emergency department for multiple symptoms.  Patient has had about a month worth of progressively worsening dyspnea on exertion.  But the last week or 2 she is also had some chest pain with exertion.  Last couple days has been more severe.  She ambulates short distance and she will have some chest pain with or without shortness of breath and then will improve with rest.  This happened a couple times today and she had near syncopal episodes with it as well so she went to cardiologist as a follow-up appointment had a near-syncopal episode when standing from sitting there so they sent her here for further evaluation.  No recent nausea, vomiting or diarrhea.  No other GI issues.  No headaches.  She says she has had some issues with her pacemaker last couple years because of infections but generally seems to be working fine she is on prophylactic lifelong antibiotics for the same.  Review of records most recent echocardiogram shows 30% EF with severely depressed function.  No recent catheterization. No other associated or modifying symptoms.    Past Medical History:  Diagnosis Date  . 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 Arthritis    "finger joints" (06/21/2016)  . Asthma   . CHF (congestive heart failure) (HCC)   . Congestive dilated cardiomyopathy (HCC) 03/31/2014   a. s/p Biotronik CRTD 06/2014  . GERD (gastroesophageal reflux disease)   . History of blood transfusion 1990s   "related to hysterectomy"  . Hypothyroidism   . Left bundle branch block   . MSSA bacteremia 06/21/2016  . Myocardial infarction (HCC)   . Pancreatitis,  acute     Patient Active Problem List   Diagnosis Date Noted  . Syncope 05/06/2017  . Anemia 05/06/2017  . Abdominal pain 05/06/2017  . Orthostatic hypotension 05/06/2017  . Chronic systolic CHF (congestive heart failure), NYHA class 3 (HCC) 01/17/2017  . GERD (gastroesophageal reflux disease) 08/21/2016  . Red blood cell antibody positive 08/19/2016  . Anemia in other chronic diseases classified elsewhere 08/15/2016  . Right groin pain 08/15/2016  . Cardiac pacemaker in situ 08/15/2016  . Painful total knee replacement, right (HCC)   . ICD (implantable cardioverter-defibrillator) infection, initial encounter (HCC)   . 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  . Arthritis 08/11/2016  . S/P lumbar fusion 05/03/2016  . Nonischemic cardiomyopathy (HCC) 02/08/2015  . Biventricular ICD (implantable cardioverter-defibrillator) in place 02/08/2015  . Chest pain 07/17/2014  . Back pain 07/17/2014  . Hypothyroidism 07/17/2014  . Prinzmetal variant angina (HCC) 07/17/2014  . Drug-induced nausea and vomiting 07/17/2014  . Chronic HFrEF (heart failure with reduced ejection fraction) (HCC) 07/04/2014  . Presyncope 04/11/2014  . Ventricular tachycardia-nonsustained  04/11/2014  . Congestive dilated cardiomyopathy (HCC) 03/31/2014  . Heart palpitations 03/31/2014  . Family history of premature CAD 10/30/2013  . LBBB (left bundle branch block) 10/30/2013  . Acrodermatitis continua of [place] 04/26/2013    Past Surgical History:  Procedure Laterality Date  . ABDOMINAL HYSTERECTOMY    . APPENDECTOMY    . BIV PACEMAKER INSERTION CRT-P N/A 01/17/2017   Procedure: BIV PACEMAKER  INSERTION CRT-P;  Surgeon: Duke Salvia, MD;  Location: [facility] INVASIVE CV LAB;  Service: Cardiovascular;  Laterality: N/A;  . CARDIAC CATHETERIZATION    . CARDIAC DEFIBRILLATOR PLACEMENT  ?2016  . CATARACT EXTRACTION W/ INTRAOCULAR LENS  IMPLANT, BILATERAL Bilateral   . CHOLECYSTECTOMY OPEN     "related to  pancreatitis"  . DILATION AND CURETTAGE OF UTERUS    . EP IMPLANTABLE DEVICE N/A 07/04/2014   Procedure: BiV ICD Insertion CRT-D;  Surgeon: Duke Salvia, MD;  Location: [facility] INVASIVE CV LAB;  Service: Cardiovascular;  Laterality: N/A;  . HERNIA REPAIR    . I&D KNEE WITH POLY EXCHANGE Right 08/11/2016   Procedure: IRRIGATION AND DEBRIDEMENT KNEE WITH POLY EXCHANGE;  Surgeon: Samson Frederic, MD;  Location: [facility] OR;  Service: Orthopedics;  Laterality: Right;  . INSERT / REPLACE / REMOVE PACEMAKER  ?2016  . JOINT REPLACEMENT Bilateral    "thumb joints; used my own material"  . JOINT REPLACEMENT    . LEFT HEART CATHETERIZATION WITH CORONARY ANGIOGRAM N/A 11/02/2013   Procedure: LEFT HEART CATHETERIZATION WITH CORONARY ANGIOGRAM;  Surgeon: Corky Crafts, MD;  Location: [facility] CATH LAB;  Service: Cardiovascular;  Laterality: N/A;  . LOOP RECORDER IMPLANT N/A 04/13/2014   Procedure: LOOP RECORDER IMPLANT;  Surgeon: Duke Salvia, MD;  Location: [facility] CATH LAB;  Service: Cardiovascular;  Laterality: N/A;  . LOOP RECORDER REMOVAL  ?2016  . POSTERIOR LUMBAR FUSION  05/03/2016   Lumbar Two-Three, Lumbar Three-Four Posterior Lumbar Fusion withLaminectomy and Foraminotomy  . SPINE SURGERY  04/2016   lumbar   . TEE WITHOUT CARDIOVERSION N/A 08/14/2016   Procedure: TRANSESOPHAGEAL ECHOCARDIOGRAM (TEE);  Surgeon: Lars Masson, MD;  Location: [facility] ENDOSCOPY;  Service: Cardiovascular;  Laterality: N/A;  . TONSILLECTOMY    . TOTAL HIP ARTHROPLASTY Bilateral   . UMBILICAL HERNIA REPAIR  1970s?    Current Outpatient Rx  . Order #: [id number] Class: Historical Med  . Order #: [id number] Class: Normal  . Order #: [id number] Class: Historical Med  . Order #: [id number] Class: Historical Med  . Order #: [id number] Class: Historical Med  . Order #: [id number] Class: Historical Med  . Order #: [id number] Class: Normal  . Order #: [id number] Class: Normal  . Order #: [id number] Class: Normal  . Order #: [id number] Class: Fill Later    . Order #: [id number] Class: Historical Med  . Order #: [id number] Class: Historical Med  . Order #: [id number] Class: Historical Med  . Order #: [id number] Class: Normal  . Order #: [id number] Class: Normal  . Order #: [id number] Class: Historical Med  . Order #: [id number] Class: Historical Med  . Order #: [id number] Class: Normal  . Order #: [id number] Class: Normal  . Order #: [id number] Class: Normal  . Order #: [id number] Class: Normal  . Order #: [id number] Class: Normal  . Order #: [id number] Class: Normal    Allergies Sulfa antibiotics  Family History  Problem Relation Age of Onset  . Hyperlipidemia Mother   . Arrhythmia Mother        palpitations  . Heart disease Father   . Heart attack Father   . Heart attack Maternal Grandfather   . Heart attack Paternal Grandfather     Social History Social History   Tobacco Use  . Smoking status: Never Smoker  . Smokeless tobacco: Never Used  . Tobacco comment: "smoked as a teenager; for maybe 1 wk"  Substance Use Topics  . Alcohol use: Yes    Comment: 6/15//2018 "glass of wine q 6 months or so"  . Drug use: No    Review of  Systems  All other systems negative except as documented in the HPI. All pertinent positives and negatives as reviewed in the HPI. ____________________________________________   PHYSICAL EXAM:  VITAL SIGNS: ED Triage Vitals  Enc Vitals Group     BP 05/06/17 1545 (!) 121/50     Pulse Rate 05/06/17 1545 78     Resp 05/06/17 1545 13     Temp --      Temp src --      SpO2 05/06/17 1545 99 %    Constitutional: Alert and oriented. Well appearing and in no acute distress. Eyes: Conjunctivae are normal. PERRL. EOMI. Head: Atraumatic. Nose: No congestion/rhinnorhea. Mouth/Throat: Mucous membranes are moist.  Oropharynx non-erythematous. Neck: No stridor.  No meningeal signs.   Cardiovascular: Normal rate, regular rhythm. Good peripheral circulation. Grossly normal heart sounds.   Respiratory: Normal respiratory  effort.  No retractions. Lungs CTAB. Gastrointestinal: Soft and nontender. No distention.  Musculoskeletal: No lower extremity tenderness nor edema. No gross deformities of extremities. Neurologic:  Normal speech and language. No gross focal neurologic deficits are appreciated.  Skin:  Skin is warm, dry and intact. No rash noted.   ____________________________________________   LABS (all labs ordered are listed, but only abnormal results are displayed)  Labs Reviewed  CBC WITH DIFFERENTIAL/PLATELET - Abnormal; Notable for the following components:      Result Value   RBC 2.39 (*)    Hemoglobin 7.9 (*)    HCT 23.5 (*)    Platelets 132 (*)    All other components within normal limits  COMPREHENSIVE METABOLIC PANEL - Abnormal; Notable for the following components:   Glucose, Bld 105 (*)    BUN 43 (*)    Creatinine, Ser 1.16 (*)    Calcium 8.6 (*)    Total Protein 5.3 (*)    Albumin 3.2 (*)    ALT 11 (*)    GFR calc non Af Amer 44 (*)    GFR calc Af Amer 51 (*)    All other components within normal limits  LIPID PANEL - Abnormal; Notable for the following components:   LDL Cholesterol 103 (*)    All other components within normal limits  D-DIMER, QUANTITATIVE (NOT AT [facility]) - Abnormal; Notable for the following components:   D-Dimer, Quant 0.62 (*)    All other components within normal limits  IRON AND TIBC - Abnormal; Notable for the following components:   Saturation Ratios 36 (*)    All other components within normal limits  RETICULOCYTES - Abnormal; Notable for the following components:   RBC. 2.44 (*)    All other components within normal limits  URINALYSIS, ROUTINE W REFLEX MICROSCOPIC  PROTIME-INR  APTT  TROPONIN I  VITAMIN B12  FOLATE  FERRITIN  OCCULT BLOOD X 1 CARD TO LAB, STOOL  TROPONIN I  TROPONIN I  CBC  BASIC METABOLIC PANEL  LIPASE, BLOOD  CBG MONITORING, ED  I-STAT TROPONIN, ED  POC OCCULT BLOOD, ED  TYPE AND SCREEN  PREPARE RBC (CROSSMATCH)    ____________________________________________  EKG   EKG Interpretation  Date/Time:  Tuesday May 06 2017 15:43:35 EDT Ventricular Rate:  78 PR Interval:    QRS Duration: 129 QT Interval:  441 QTC Calculation: 503 R Axis:   -11 Text Interpretation:  Sinus rhythm Ventricular premature complex Left bundle branch block likely paced rhythm similar to previous in january 2019\ Confirmed by Marily Memos [phone]) on 05/06/2017 3:53:23 PM       ____________________________________________  RADIOLOGY  Dg Chest 2 View  Result Date: 05/06/2017 CLINICAL DATA:  Syncope, chest pain EXAM: CHEST - 2 VIEW COMPARISON:  01/18/2017 FINDINGS: Lungs are clear.  No pleural effusion or pneumothorax. Mild eventration of the hemidiaphragm. The heart is top-normal in size.  Left subclavian pacemaker. Degenerative changes of the visualized thoracolumbar spine. IMPRESSION: No evidence of acute cardiopulmonary disease. Electronically Signed   By: Charline Bills M.D.   On: 05/06/2017 16:25    ____________________________________________   PROCEDURES  Procedure(s) performed:   Procedures  CRITICAL CARE Performed by: Marily Memos Total critical care time: 35 minutes Critical care time was exclusive of separately billable procedures and treating other patients. Critical care was necessary to treat or prevent imminent or life-threatening deterioration. Critical care was time spent personally by me on the following activities: development of treatment plan with patient and/or surrogate as well as nursing, discussions with consultants, evaluation of patient's response to treatment, examination of patient, obtaining history from patient or surrogate, ordering and performing treatments and interventions, ordering and review of laboratory studies, ordering and review of radiographic studies, pulse oximetry and re-evaluation of patient's condition.  ____________________________________________   INITIAL  IMPRESSION / ASSESSMENT AND PLAN / ED COURSE  Patient high risk syncope and chest pain patient secondary to her description of symptoms and her past medical history.  She will likely need to be admitted for the same but will check orthostatics, EKG, troponin.  Secondary to the shortness of breath component we will do a d-dimer as well.  D-dimer slightly elevated but age-adjusted she does not meet criteria for CT scan.  Troponin negative.  EKG okay.  Her hemoglobin is 7.9 which is new for her which very well could be the cause of her symptoms so we will give a unit of blood and admit to the hospital for ACS work-up versus anemia work-up.  Hemoccult negative.    Pertinent labs & imaging results that were available during my care of the patient were reviewed by me and considered in my medical decision making (see chart for details).  ____________________________________________  FINAL CLINICAL IMPRESSION(S) / ED DIAGNOSES  Final diagnoses:  Anemia, unspecified type  Chest pain, unspecified type  SOB (shortness of breath)     MEDICATIONS GIVEN DURING THIS VISIT:  Medications  0.9 %  sodium chloride infusion ( Intravenous New Bag/Given 05/06/17 1730)  famotidine (PEPCID) IVPB 20 mg premix (20 mg Intravenous New Bag/Given 05/06/17 2300)  cefTRIAXone (ROCEPHIN) 2 g in sodium chloride 0.9 % 100 mL IVPB (has no administration in time range)  rifampin (RIFADIN) 300 mg in sodium chloride 0.9 % 100 mL IVPB (300 mg Intravenous New Bag/Given 05/06/17 2300)  naphazoline-pheniramine (NAPHCON-A) 0.025-0.3 % ophthalmic solution 1-2 drop (has no administration in time range)  acetaminophen (TYLENOL) tablet 650 mg (has no administration in time range)    Or  acetaminophen (TYLENOL) suppository 650 mg (has no administration in time range)  lactated ringers infusion ( Intravenous New Bag/Given 05/06/17 2355)  aspirin chewable tablet 324 mg (324 mg Oral Given 05/06/17 1730)  0.9 %  sodium chloride infusion (  Intravenous New Bag/Given 05/06/17 1904)  lactated ringers bolus 1,000 mL (0 mLs Intravenous Stopped 05/06/17 2216)  fentaNYL (SUBLIMAZE) injection 50 mcg (50 mcg Intravenous Given 05/06/17 2016)     NEW OUTPATIENT MEDICATIONS STARTED DURING THIS VISIT:  Current Discharge Medication List      Note:  This note was prepared with assistance of Dragon voice recognition software. Occasional wrong-word or sound-a-like substitutions may have  occurred due to the inherent limitations of voice recognition software.   Marily Memos, MD 05/06/17 2356

## 2017-05-06 NOTE — ED Notes (Signed)
Pt c/o [facility] & CP rated @ a 6/10. Put pt on 2L [place] and made EDP aware of pt CP.

## 2017-05-06 NOTE — H&P (Addendum)
Date: 05/06/2017               Patient Name:  [name] [name] MRN: [id number]  DOB: 03/26/1935 Age / Sex: 82 y.o., female   PCP: [name], MD         Medical Service: Internal Medicine Teaching Service         Attending Physician: Dr. [name], MD    First Contact: Dr. Saunders Revel Pager: [phone]  Second Contact: Dr. Nelson Chimes Pager: [phone]       After Hours (After 5p/  First Contact Pager: [phone]  weekends / holidays): Second Contact Pager: [phone]   Chief Complaint: chest pain  History of Present Illness:  [name] is a 82yo female with PMH of NICM, CHF (EF 30% 2018) s/p CRT, chronic LBBB, medication induced pancreatitis, h/o MI vs Prinzmetal angina?, h/o ICD/pacemaker device infection > bacteremia > septic joint on chronic suppressive Abx, presenting to [facility] from cardiology office for weakness, falls, and chest pain.  Patient states that shortly after starting to walk from the car to the cards office, she had a sudden onset weakness, chest pain, nausea, dizziness, and shortness of breath which caused her to fall to the ground; she states her legs felt like jelly. The chest pain is described as sharp, subxyphoid, w/o radiation, w/o numbness/tingling in extremities. Symptoms largely resolved after she was wheeled into the cardiology office; the staff noted that she was orthostatic on arrival. She had another episode while being examined by Dr. Graciela Husbands, her cardiologist, when he asked her to transfer with assistance from the chair to the examination table. She denies loss of consciousness (and none documented). She was sent to the ED for further evaluation and management. She received ASA 324mg  and 500cc NS bolus en route.   Patient states that she has not had similar pain before; her previous anginal pain was left jaw discomfort which was relieved by nitro at previous admission; from chart review cardiac cath was clear at that time and it appears she was diagnosed with  Prinzmetal angina; she denies similar symptoms currently. She states that she has had the chest pain at rest as well today earlier in the morning with shortness of breath; the SOB was relieved with albuterol.  She does note about 3wk h/o dyspnea on climbing stairs which is relieved by rest and albuterol inhaler (has h/o childhood asthma). She endorses cough for last few weeks which she relates to allergies; only productive of minimal sputum in the morning with noted post-nasal drip. She denies hemoptysis, orthopnea, wheezing.   She also notes 2-3 day h/o RUQ/epigastric pain which is dull in nature and associated with mild nausea but no vomiting. It is somewhat similar to previous episode of pancreatitis (which she describes as being shot) in terms of location but much milder and dull. The pain is relieved with TUMS, but is unrelated to food intake or empty stomach as far as the patient can tell. She does endorse acid reflux. She endorses regular bowel movements daily w/o constipation, diarrhea or melena; she does endorse red discoloration to her stool which she attributes to rifampin; she denies frank hematochezia. She endorses chronic dysphagia to bread/hamburger which is stable and controlled with increased fluid intake with these foods; EGD 2016 which she states was unremarkable. She follows with Dr. Randa Evens for screening colonoscopies about every 3-5 years and has had a few polyps removed each time (no records in our system); was supposed to have colonoscopy  last year but Dr. Randa Evens deferred as she had significant ongoing medical problems.   She denies gum bleeding, hematuria, vaginal bleeding; she does have a bruise on her right wrist which she thinks was caused by a bracelet over the weekend but denies other easy bruising.  ED course: CBC noted Hgb of 7.9 (previously 12 three months ago), plts 132. CMet remarkable for BUN 43, Cr 1.16 (mildly increased from b/l 0.8-1); initial trop negative, D dimer  0.62. FOBT negative. She was given 1L LR over 2 hrs, 1 unit of pRBCs. Anemia labs were ordered.   Meds:  Current Meds  Medication Sig  . albuterol (PROAIR HFA) 108 (90 Base) MCG/ACT inhaler Inhale 2 puffs into the lungs every 6 (six) hours as needed for wheezing or shortness of breath.  [name] alum & mag hydroxide-simeth (MAALOX/MYLANTA) 200-200-20 MG/5ML suspension Take 30 mLs by mouth every 4 (four) hours as needed for indigestion.  [name] aspirin EC 81 MG tablet Take 81 mg by mouth daily.  [name] aspirin-acetaminophen-caffeine (EXCEDRIN MIGRAINE) 250-250-65 MG per tablet Take 2 tablets by mouth every 8 (eight) hours as needed for headache or migraine.   . betamethasone dipropionate (DIPROLENE) 0.05 % ointment Apply 1 application topically daily as needed.  . Calcium Carbonate Antacid (TUMS PO) Take 1 tablet by mouth daily as needed (indigestion).   . carvedilol (COREG) 12.5 MG tablet Take 1.5 tablets (18.75 mg total) 2 (two) times daily by mouth. (Patient taking differently: Take 12.5 mg by mouth 2 (two) times daily. )  . cephALEXin (KEFLEX) 500 MG capsule Take 1 capsule (500 mg total) by mouth 4 (four) times daily.  . feeding supplement, ENSURE ENLIVE, (ENSURE ENLIVE) LIQD Take 237 mLs by mouth 2 (two) times daily between meals. (Patient taking differently: Take 237 mLs by mouth daily. )  . furosemide (LASIX) 20 MG tablet Take 1 tablet (20 mg total) by mouth daily. (Patient taking differently: Take 20 mg by mouth daily as needed for fluid. )  . levothyroxine (SYNTHROID, LEVOTHROID) 112 MCG tablet Take 112 mcg by mouth daily before breakfast.  . Magnesium 200 MG TABS Take 200 mg by mouth daily.  . Melatonin 3 MG TABS Take 3 mg by mouth at bedtime as needed (for sleep.).   [name] menthol-cetylpyridinium (CEPACOL) 3 MG lozenge Take 1 lozenge (3 mg total) by mouth as needed for sore throat (sore throat). (Patient taking differently: Take 1 lozenge by mouth as needed for sore throat (sore throat). Took [place])  .  methocarbamol (ROBAXIN) 500 MG tablet Take 1 tablet (500 mg total) by mouth every 6 (six) hours as needed for muscle spasms.  . methylphenidate (RITALIN) 20 MG tablet Take 20 mg by mouth daily.   March Rummage (ALLERGY EYE OP) Place 1-2 drops into both eyes daily as needed (for irritated eyes).  . ondansetron (ZOFRAN) 4 MG tablet Take 1 tablet (4 mg total) by mouth every 6 (six) hours as needed for nausea.  . polyethylene glycol (MIRALAX / GLYCOLAX) packet Take 17 g by mouth daily as needed for mild constipation.  . rifampin (RIFADIN) 300 MG capsule TAKE 1 CAPSULE(300 MG) BY MOUTH EVERY 12 HOURS  . sacubitril-valsartan (ENTRESTO) 24-26 MG Take 1 tablet by mouth 2 (two) times daily.  [name] spironolactone (ALDACTONE) 25 MG tablet Take 0.5 tablets (12.5 mg total) by mouth daily. (Patient taking differently: Take 12.5 mg by mouth as needed. )   Allergies: Allergies as of 05/06/2017 - Review Complete 05/06/2017  Allergen Reaction Noted  . Sulfa  antibiotics Hives 10/11/2013   Past Medical History:  Diagnosis Date  . 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 Arthritis    "finger joints" (06/21/2016)  . Asthma   . CHF (congestive heart failure) (HCC)   . Congestive dilated cardiomyopathy (HCC) 03/31/2014   a. s/p Biotronik CRTD 06/2014  . GERD (gastroesophageal reflux disease)   . History of blood transfusion 1990s   "related to hysterectomy"  . Hypothyroidism   . Left bundle branch block   . MSSA bacteremia 06/21/2016  . Myocardial infarction (HCC)   . Pancreatitis, acute     Family History: Father had colon cancer (adenocarcinoma) diagnosed in his 5s; Sister had breast cancer.  Social History: denies current or prior tobacco or illicit drug use; drinks a glass of wine about twice a year.  Review of Systems: A complete ROS was negative except as per HPI.   Physical Exam: Blood pressure  109/60, pulse 84, temperature 98.3 F (36.8 C), resp. rate 19, SpO2 100 %. GENERAL- alert, co-operative, appears as stated age, not in any distress. HEENT- Atraumatic, normocephalic, PERRL, EOMI, oral mucosa appears moist, no gum bleeding noted; mildly enlarged left submandibular lymph node w/o tenderness - mobile and rubbery. CARDIAC- RRR, 3/6 systolic murmur best heard at RUSB, no rubs or gallops. Incompletely reproducible chest pain with palpation of lower 1/4th of sternum RESP- Moving equal volumes of air, and clear to auscultation bilaterally, no wheezes or crackles, no increased work of breathing. Patient speaking in full sentences. ABDOMEN- Soft, decreased bowel sounds; mild epigastric tenderness, moderate RUQ tenderness NEURO- No obvious Cr N abnormality. EXTREMITIES- pulse 2+ DP/PT, symmetric, no pedal edema. Scar over R knee w/o effusion/warmth/tenderness.  SKIN- Warm, dry. ~1in ecchymosis on dorsum of right wrist PSYCH- Normal mood and affect, appropriate thought content and speech.  EKG: personally reviewed my interpretation is SR, LBBB, w/o cute ST segment elevation or TWI  CXR: personally reviewed my interpretation is no infiltrate, consolidation, or effusion.  Assessment & Plan by Problem: Active Problems:   Chest pain   Anemia   Abdominal pain   Orthostatic hypotension  Near syncope Orthostatic hypotension: Patient with 2 episodes of near syncope when standing up today found to have orthostatic hypotension and new normocytic anemia. Initial troponin was negative and EKG nonischemic but patient does have LBBB which could obscure ischemic changes; no arrhythmia noted. By the time of my evaluation she had already received some fluids and appeared euvolemic with warm extremities so likely not low output HF. She had mild AS/AR on last echo; I doubt this has progressed to be implicated in her current presentation. --fluid resuscitation - s/p 2.5L bolus; LR 62ml/hr --getting 1 u  pRBCs --hold entresto, coreg --repeat orthostatic VS in AM  Atypical chest pain: Patient with subxyphoid chest pain w/ shortness of breath and nausea which occurs at rest (observed in room, lasts <74min) and was associated with her near-syncopal episodes. Initial trop neg and EKG nonischemic but has chronic LBBB which may be masking ischemic changes. Per pt, her pacer was interrogated while in the cards office. Etiology could be cardiac (clean cath 2015), demand angina in setting of anemia, esophageal (most likely), Prinzmetal angina. D dimer was slightly elevated to 0.62 (under age-adjusted cut-off), w/o hemoptysis, s/sx of DVT; she has also been compliant with abx suppressive therapy so PE or infectious embolus unlikely. --trend trops --avoid nitro for now in  setting of low BP  Normocytic anemia Abd pain: Patient with Hgb of 7.9 on admission with last CBC in Jan 2019 with Hgb of 12.0; she did have a drop in her Hgb all the way down to 7.3 during Aug 2018 hospitalization for endocarditis and surgeries but appears to have recovered steadily and appropriately since then. She denies any signs of bleeding; UA neg for blood; she does note red stools chronically which she attributes to rifampin. She does use Excedrin (I did not quantify her use) and does report issues with acid reflux. FOBT was negative in the ED. Bili was wnl so hemolytic anemia less likely. Iron was wnl, ferritin low normal; B12 intermediate but she has normocytic anemia so less likely. She does have some epigastric pain and RUQ pain and in setting of anemia, elevated BUN, will treat as UGI bleed for now. EGD in 2016 duodenal pathology was unremarkable but the full EGD report is not available for review. --in process of getting 1 unit of pRBC per EDP due to CP with anemia <8 in cardiac patient --will get lipase though low suspicion for pancreatitis --f/u AM CBC --pepcid 20mg  IV BID --NPO for now --IVF; will need monitoring for fluid  overload in setting of HF --consider GI consult in AM for endoscopy [facility]) --RUQ pain is not at Murphy's point; with other findings I have low suspicion for cholecystitis but can consider U/S in AM.  H/o cardiac device infection, bacteremia, and septic joint: Due to need to re-insert CRT prior to full completion of Abx (6mos per notes), patient in on chronic suppressive antibiotics with keflex 500mg  QID and rifampin 300mg  BID. She has been compliant with this regimen. --administer IV ceftriaxone 2g q24hr and IV rifampin 300mg  BID as patient is currently NPO  NICM Combined CHF: Patient with last echo (prior to re-insertion of pacemaker) showing EF 30% with diffuse hypokinesis, mild AR, mod MR; she has not had repeat echo yet since re-insertion of pacer in Jan 2019. --hold entresto, coreg and lasix in setting of hypotension --consider echo this admission for re-eval of EF  Hypothyroidism: Patient on levothyroxine daily. --hold for now as patient NPO  *Addendum: repeat CBC showed worsening anemia to 6.6; renal function stable, BUN still elevated to 43; lipase neg; 2nd trop negative. Evaluated patient at bedside and spoke with nursing - still no overt bleeding. Patient states she overall feels better than when she first came to the ED; denies further CP, SOB; denies dizziness or headache; she still has her chronic, unchanged back pain; still has some RUQ pain but hasn't required pain medicine. Patient A&Ox3, BP stable, pulses equal, normocardic; CV shows RRR, unchanged systolic murmur; Lungs clear throughout; no significant LE edema; no flank bruising.  >2pt drop in Hgb is slightly more than I would expect with just dilution even though she has gotten a fair amount of fluids since admission.  --transfuse 2 units pRBC --stat CT abd/pel w contrast to eval for retroperitoneal bleed --save smear to eval for hemolysis though labs from admission do not support hemolysis  Dispo: Admit patient to  Observation with expected length of stay less than 2 midnights.  Signed: Nyra Market, MD 05/06/2017, 10:27 PM  [facility] - PGY2 Pager [phone]

## 2017-05-06 NOTE — ED Triage Notes (Signed)
Pt BIB [facility] from MD office. Pt had witnessed syncopal episode with nausea, diaphoresis and shortness of breath. Pt reports intermittent chest pain x 1 month. EMS vitals: BP 106/76, HR 82, CBG 100. Given 4mg  zofran, and 250 cc NS PTA.

## 2017-05-06 NOTE — ED Notes (Addendum)
No blood draw at this time,  Pt just finished receiving a blood transfusion.

## 2017-05-06 NOTE — ED Notes (Signed)
Patient transported to X-ray 

## 2017-05-06 NOTE — Progress Notes (Signed)
Patient Care Team: [name], MD as PCP - General (Family Medicine) Lars Masson, MD as PCP - Cardiology (Cardiology)   HPI  [name] [name] is a 82 y.o. female Seen in follow-up for nonischemic cardiomyopathy left bundle branch block and nonsustained ventricular tachycardia.   She underwent CRT Biotronik implantation  6/16.  6/18  hospitalized and found to have staph bacteremia of (MSSA) in the wake of orthopedic surgery  TEE at [facility] was reportedly negative. She was treated with  6 weeks of antibiotics  She developed recurrent 'septic arthritis" and bacteremia and TEE>>Vegetation on RA lead.  Transferred to [facility] for extraction which she tolerated well   Rx with 8 weeks of IV Abx, most recently 10/18 keflex and rifampin; these are anticipated to last at least 6 months   Because of worsening left ventricular function and worsening functional status she underwent CRT reimplantation 1/19 aware that she would need long-term chronic suppressive antibiotics  She has been considerably better following CRT implantation.  There is less shortness of breath and more energy.  Repeat echo has not yet been done.  She has noted some shortness of breath over the last 3-4 weeks.  This is been concurrent with a 5 pound weight gain.   Her husband states she is also had intermittent abdominal pain over the last 5 days.  She is ascribed to a "ulcer".  She has a history of pancreatitis.  Today she had episodes of nausea associated with profound weakness.  This occurred upon coming into the office resolved over about 10 to 15 minutes.  While she was seated in the office we then got her up to get onto the examining table and the symptoms recurred.  Initial blood pressure was 82 palp she was pale and diaphoretic.  Over the subsequent 10 minutes her blood pressure came up to 110.  This was accompanied throughout by nausea and worsening nausea.          DATE TEST    6/16  Echo   EF 25 %   1/17 Echo   EF 55-60 %   10/18 Echo EF 30%  Mild AS Mod MR     Date Cr K Hgb  1/19 0.97 4.4 12.02       Past Medical History:  Diagnosis Date  . 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 Arthritis    "finger joints" (06/21/2016)  . Asthma   . CHF (congestive heart failure) (HCC)   . Congestive dilated cardiomyopathy (HCC) 03/31/2014   a. s/p Biotronik CRTD 06/2014  . GERD (gastroesophageal reflux disease)   . History of blood transfusion 1990s   "related to hysterectomy"  . Hypothyroidism   . Left bundle branch block   . MSSA bacteremia 06/21/2016  . Myocardial infarction (HCC)   . Pancreatitis, acute     Past Surgical History:  Procedure Laterality Date  . ABDOMINAL HYSTERECTOMY    . APPENDECTOMY    . BIV PACEMAKER INSERTION CRT-P N/A 01/17/2017   Procedure: BIV PACEMAKER INSERTION CRT-P;  Surgeon: Duke Salvia, MD;  Location: [facility] INVAS[facility] CV LAB;  Service: Cardiovascular;  Laterality: N/A;  . CARDIAC CATHETERIZATION    . CARDIAC DEFIBRILLATOR PLACEMENT  ?2016  . CATARACT EXTRACTION W/ INTRAOCULAR LENS  IMPLANT, BILATERAL Bilateral   . CHOLECYSTECTOMY OPEN     "related to pancreatitis"  .  DILATION AND CURETTAGE OF UTERUS    . EP IMPLANTABLE DEVICE N/A 07/04/2014   Procedure: BiV ICD Insertion CRT-D;  Surgeon: Duke Salvia, MD;  Location: [facility] INVASIVE CV LAB;  Service: Cardiovascular;  Laterality: N/A;  . HERNIA REPAIR    . I&D KNEE WITH POLY EXCHANGE Right 08/11/2016   Procedure: IRRIGATION AND DEBRIDEMENT KNEE WITH POLY EXCHANGE;  Surgeon: Samson Frederic, MD;  Location: [facility] OR;  Service: Orthopedics;  Laterality: Right;  . INSERT / REPLACE / REMOVE PACEMAKER  ?2016  . JOINT REPLACEMENT Bilateral    "thumb joints; used my own material"  . JOINT REPLACEMENT    . LEFT HEART CATHETERIZATION WITH CORONARY ANGIOGRAM N/A 11/02/2013   Procedure: LEFT HEART CATHETERIZATION  WITH CORONARY ANGIOGRAM;  Surgeon: Corky Crafts, MD;  Location: [facility] CATH LAB;  Service: Cardiovascular;  Laterality: N/A;  . LOOP RECORDER IMPLANT N/A 04/13/2014   Procedure: LOOP RECORDER IMPLANT;  Surgeon: Duke Salvia, MD;  Location: [facility] CATH LAB;  Service: Cardiovascular;  Laterality: N/A;  . LOOP RECORDER REMOVAL  ?2016  . POSTERIOR LUMBAR FUSION  05/03/2016   Lumbar Two-Three, Lumbar Three-Four Posterior Lumbar Fusion withLaminectomy and Foraminotomy  . SPINE SURGERY  04/2016   lumbar   . TEE WITHOUT CARDIOVERSION N/A 08/14/2016   Procedure: TRANSESOPHAGEAL ECHOCARDIOGRAM (TEE);  Surgeon: Lars Masson, MD;  Location: [facility] ENDOSCOPY;  Service: Cardiovascular;  Laterality: N/A;  . TONSILLECTOMY    . TOTAL HIP ARTHROPLASTY Bilateral   . UMBILICAL HERNIA REPAIR  1970s?    Current Outpatient Medications  Medication Sig Dispense Refill  . albuterol (PROAIR HFA) 108 (90 Base) MCG/ACT inhaler Inhale 2 puffs into the lungs every 6 (six) hours as needed for wheezing or shortness of breath.    [name] alum & mag hydroxide-simeth (MAALOX/MYLANTA) 200-200-20 MG/5ML suspension Take 30 mLs by mouth every 4 (four) hours as needed for indigestion. 355 mL 0  . aspirin EC 81 MG tablet Take 81 mg by mouth daily.    [name] aspirin-acetaminophen-caffeine (EXCEDRIN MIGRAINE) 250-250-65 MG per tablet Take 2 tablets by mouth every 8 (eight) hours as needed for headache or migraine.     [name] augmented betamethasone dipropionate (DIPROLENE-AF) 0.05 % ointment Apply 1 application topically daily as needed (acrodermatitis [place] on fingers/toes).     . betamethasone dipropionate (DIPROLENE) 0.05 % ointment Apply 1 application topically daily as needed.    . Calcium Carbonate Antacid (TUMS PO) Take 1 tablet by mouth daily as needed (indigestion).     . cephALEXin (KEFLEX) 500 MG capsule Take 1 capsule (500 mg total) by mouth 4 (four) times daily. 120 capsule 11  . diphenhydrAMINE (BENADRYL) 12.5 MG/5ML elixir Take 5-10  mLs (12.5-25 mg total) by mouth every 4 (four) hours as needed for itching. 120 mL 0  . docusate sodium (COLACE) 100 MG capsule Take 1 capsule (100 mg total) by mouth 2 (two) times daily. (Patient taking differently: Take 100 mg by mouth daily as needed for mild constipation. ) 10 capsule 0  . feeding supplement, ENSURE ENLIVE, (ENSURE ENLIVE) LIQD Take 237 mLs by mouth 2 (two) times daily between meals. (Patient taking differently: Take 237 mLs by mouth daily. ) 237 mL 12  . levothyroxine (SYNTHROID, LEVOTHROID) 112 MCG tablet Take 112 mcg by mouth daily before breakfast.    . Magnesium 200 MG TABS Take 200 mg by mouth daily.    . Melatonin 5 MG TABS Take 5 mg by mouth at bedtime as needed (for sleep.).     [name]  menthol-cetylpyridinium (CEPACOL) 3 MG lozenge Take 1 lozenge (3 mg total) by mouth as needed for sore throat (sore throat). 100 tablet 12  . methocarbamol (ROBAXIN) 500 MG tablet Take 1 tablet (500 mg total) by mouth every 6 (six) hours as needed for muscle spasms. 10 tablet 0  . methylphenidate (RITALIN) 20 MG tablet Take 20 mg by mouth daily as needed (narcolepsy).     [name] metoCLOPramide (REGLAN) 5 MG tablet Take 1-2 tablets (5-10 mg total) by mouth every 8 (eight) hours as needed for nausea (if ondansetron (ZOFRAN) ineffective.). 10 tablet 0  . Naphazoline-Pheniramine (ALLERGY EYE OP) Place 1-2 drops into both eyes daily as needed (for irritated eyes).    . ondansetron (ZOFRAN) 4 MG tablet Take 1 tablet (4 mg total) by mouth every 6 (six) hours as needed for nausea. 20 tablet 0  . polyethylene glycol (MIRALAX / GLYCOLAX) packet Take 17 g by mouth daily as needed for mild constipation. 14 each 0  . Probiotic Product (PROBIOTIC PO) Take 1 capsule by mouth every other day.     . rifampin (RIFADIN) 300 MG capsule TAKE 1 CAPSULE(300 MG) BY MOUTH EVERY 12 HOURS 60 capsule 0  . sacubitril-valsartan (ENTRESTO) 24-26 MG Take 1 tablet by mouth 2 (two) times daily. 180 tablet 2  . senna (SENOKOT) 8.6 MG  TABS tablet Take 2 tablets (17.2 mg total) by mouth at bedtime. 120 each 0  . carvedilol (COREG) 12.5 MG tablet Take 1.5 tablets (18.75 mg total) 2 (two) times daily by mouth. 180 tablet 3  . furosemide (LASIX) 20 MG tablet Take 1 tablet (20 mg total) by mouth daily. (Patient taking differently: Take 20 mg by mouth daily as needed for fluid. ) 90 tablet 3  . nitroGLYCERIN (NITROSTAT) 0.4 MG SL tablet Place 1 tablet (0.4 mg total) every 5 (five) minutes as needed under the tongue for chest pain. 90 tablet 3  . spironolactone (ALDACTONE) 25 MG tablet Take 0.5 tablets (12.5 mg total) by mouth daily. 90 tablet 3   No current facility-administered medications for this visit.     Allergies  Allergen Reactions  . Sulfa Antibiotics Hives    Review of Systems negative except from HPI and PMH  Physical Exam BP (!) 104/56   Pulse 96   Ht 4\' 10"  (1.473 m)   LMP  (LMP Unknown)   SpO2 99%   BMI 33.44 kg/m  Well developed and nourished in modest distress.  This is aggravated by standing as outlined above.  She was pale prior to standing and more so afterwards.  Diaphoretic  HENT normal Neck supple with JVP-flat Clear Regular rate and rhythm, no murmurs or gallops Abd-soft with active BS No Clubbing cyanosis edema Skin-As above  A & Oriented  Grossly normal sensory and motor function     ECG personally reviewed P synchronous pacing at 98 Interval 14/13/40 Left bundle branch block   Pre-implant QRS duration was 168 ms Post implant QRS duration device #1 was 110 ms is patient is  Assessment and  Plan  Nonischemic cardiomyopathy    Left bundle branch block  CRT-D  reimplantation  Congestive heart failure class III  Hypertension  Septic Arthritis--chronic antibiotic suppression  Recurrent presyncope associated with nausea question mechanism  Abdominal pain   The patient has had recurrent episodes today of nausea profound weakness and presyncope.  The mechanism is not clear.   She appeared pale initially; this was in the wake of the initial event.  She needs her  hemoglobin checked.  Given the fact that she has had 2 of these events with standing and walking, we will send her to the hospital  Her ECG with Vpacing might mask MI;  But no known CAD,    She has multiple potential GI issues--  Functionally she seemed to be doing quite well following CRT implantation.  QRS is considerably narrowed.  She needs repeat echocardiogram to reassess LV function.  More than 50% of 40 3 times min was spent in counseling related to the above    .

## 2017-05-06 NOTE — ED Notes (Signed)
Orthostatic vital signs attempted. Upon standing, pulse was read but pt was too dizzy to remain standing to check blood pressure. Pt resting in bed at this time.

## 2017-05-07 ENCOUNTER — Encounter (HOSPITAL_COMMUNITY): Payer: Self-pay | Admitting: Radiology

## 2017-05-07 ENCOUNTER — Observation Stay (HOSPITAL_COMMUNITY): Payer: Medicare Other

## 2017-05-07 ENCOUNTER — Other Ambulatory Visit: Payer: Self-pay

## 2017-05-07 DIAGNOSIS — R0789 Other chest pain: Secondary | ICD-10-CM | POA: Diagnosis not present

## 2017-05-07 DIAGNOSIS — D509 Iron deficiency anemia, unspecified: Secondary | ICD-10-CM | POA: Diagnosis not present

## 2017-05-07 DIAGNOSIS — Z86010 Personal history of colonic polyps: Secondary | ICD-10-CM

## 2017-05-07 DIAGNOSIS — Z8719 Personal history of other diseases of the digestive system: Secondary | ICD-10-CM

## 2017-05-07 DIAGNOSIS — K264 Chronic or unspecified duodenal ulcer with hemorrhage: Secondary | ICD-10-CM | POA: Diagnosis not present

## 2017-05-07 DIAGNOSIS — Z79899 Other long term (current) drug therapy: Secondary | ICD-10-CM

## 2017-05-07 DIAGNOSIS — Z8709 Personal history of other diseases of the respiratory system: Secondary | ICD-10-CM

## 2017-05-07 DIAGNOSIS — K210 Gastro-esophageal reflux disease with esophagitis: Secondary | ICD-10-CM

## 2017-05-07 DIAGNOSIS — Z95810 Presence of automatic (implantable) cardiac defibrillator: Secondary | ICD-10-CM

## 2017-05-07 DIAGNOSIS — L304 Erythema intertrigo: Secondary | ICD-10-CM | POA: Diagnosis not present

## 2017-05-07 DIAGNOSIS — Z792 Long term (current) use of antibiotics: Secondary | ICD-10-CM | POA: Diagnosis not present

## 2017-05-07 DIAGNOSIS — T82897A Other specified complication of cardiac prosthetic devices, implants and grafts, initial encounter: Secondary | ICD-10-CM | POA: Diagnosis not present

## 2017-05-07 DIAGNOSIS — I951 Orthostatic hypotension: Secondary | ICD-10-CM

## 2017-05-07 DIAGNOSIS — I201 Angina pectoris with documented spasm: Secondary | ICD-10-CM

## 2017-05-07 DIAGNOSIS — Z9889 Other specified postprocedural states: Secondary | ICD-10-CM

## 2017-05-07 DIAGNOSIS — K2970 Gastritis, unspecified, without bleeding: Secondary | ICD-10-CM | POA: Diagnosis present

## 2017-05-07 DIAGNOSIS — M549 Dorsalgia, unspecified: Secondary | ICD-10-CM

## 2017-05-07 DIAGNOSIS — I5040 Unspecified combined systolic (congestive) and diastolic (congestive) heart failure: Secondary | ICD-10-CM | POA: Diagnosis not present

## 2017-05-07 DIAGNOSIS — E039 Hypothyroidism, unspecified: Secondary | ICD-10-CM

## 2017-05-07 DIAGNOSIS — E869 Volume depletion, unspecified: Secondary | ICD-10-CM | POA: Diagnosis not present

## 2017-05-07 DIAGNOSIS — D649 Anemia, unspecified: Secondary | ICD-10-CM | POA: Diagnosis not present

## 2017-05-07 DIAGNOSIS — R1310 Dysphagia, unspecified: Secondary | ICD-10-CM | POA: Diagnosis present

## 2017-05-07 DIAGNOSIS — I252 Old myocardial infarction: Secondary | ICD-10-CM

## 2017-05-07 DIAGNOSIS — I110 Hypertensive heart disease with heart failure: Secondary | ICD-10-CM

## 2017-05-07 DIAGNOSIS — Z882 Allergy status to sulfonamides status: Secondary | ICD-10-CM

## 2017-05-07 DIAGNOSIS — Z961 Presence of intraocular lens: Secondary | ICD-10-CM | POA: Diagnosis present

## 2017-05-07 DIAGNOSIS — Z8619 Personal history of other infectious and parasitic diseases: Secondary | ICD-10-CM | POA: Diagnosis not present

## 2017-05-07 DIAGNOSIS — I447 Left bundle-branch block, unspecified: Secondary | ICD-10-CM

## 2017-05-07 DIAGNOSIS — R1011 Right upper quadrant pain: Secondary | ICD-10-CM | POA: Diagnosis not present

## 2017-05-07 DIAGNOSIS — Z7982 Long term (current) use of aspirin: Secondary | ICD-10-CM | POA: Diagnosis not present

## 2017-05-07 DIAGNOSIS — R0602 Shortness of breath: Secondary | ICD-10-CM | POA: Diagnosis present

## 2017-05-07 DIAGNOSIS — R011 Cardiac murmur, unspecified: Secondary | ICD-10-CM

## 2017-05-07 DIAGNOSIS — Z9841 Cataract extraction status, right eye: Secondary | ICD-10-CM | POA: Diagnosis not present

## 2017-05-07 DIAGNOSIS — J45909 Unspecified asthma, uncomplicated: Secondary | ICD-10-CM | POA: Diagnosis not present

## 2017-05-07 DIAGNOSIS — R1013 Epigastric pain: Secondary | ICD-10-CM

## 2017-05-07 DIAGNOSIS — I5042 Chronic combined systolic (congestive) and diastolic (congestive) heart failure: Secondary | ICD-10-CM | POA: Diagnosis not present

## 2017-05-07 DIAGNOSIS — Z79890 Hormone replacement therapy (postmenopausal): Secondary | ICD-10-CM

## 2017-05-07 DIAGNOSIS — K922 Gastrointestinal hemorrhage, unspecified: Secondary | ICD-10-CM | POA: Diagnosis present

## 2017-05-07 DIAGNOSIS — Z9842 Cataract extraction status, left eye: Secondary | ICD-10-CM | POA: Diagnosis not present

## 2017-05-07 DIAGNOSIS — I420 Dilated cardiomyopathy: Secondary | ICD-10-CM | POA: Diagnosis not present

## 2017-05-07 DIAGNOSIS — G8929 Other chronic pain: Secondary | ICD-10-CM

## 2017-05-07 DIAGNOSIS — D62 Acute posthemorrhagic anemia: Secondary | ICD-10-CM | POA: Diagnosis not present

## 2017-05-07 DIAGNOSIS — Z950 Presence of cardiac pacemaker: Secondary | ICD-10-CM | POA: Diagnosis not present

## 2017-05-07 DIAGNOSIS — Z90710 Acquired absence of both cervix and uterus: Secondary | ICD-10-CM | POA: Diagnosis not present

## 2017-05-07 DIAGNOSIS — W19XXXA Unspecified fall, initial encounter: Secondary | ICD-10-CM | POA: Diagnosis present

## 2017-05-07 DIAGNOSIS — R110 Nausea: Secondary | ICD-10-CM | POA: Diagnosis not present

## 2017-05-07 LAB — BASIC METABOLIC PANEL
Anion gap: 7 (ref 5–15)
BUN: 43 mg/dL — ABNORMAL HIGH (ref 6–20)
CALCIUM: 7.6 mg/dL — AB (ref 8.9–10.3)
CO2: 22 mmol/L (ref 22–32)
Chloride: 112 mmol/L — ABNORMAL HIGH (ref 101–111)
Creatinine, Ser: 1.18 mg/dL — ABNORMAL HIGH (ref 0.44–1.00)
GFR calc Af Amer: 49 mL/min — ABNORMAL LOW (ref >60)
GFR calc non Af Amer: 43 mL/min — ABNORMAL LOW (ref >60)
Glucose, Bld: 86 mg/dL (ref 65–99)
Potassium: 4.1 mmol/L (ref 3.5–5.1)
Sodium: 141 mmol/L (ref 135–145)

## 2017-05-07 LAB — CBC
HCT: 19.6 % — ABNORMAL LOW (ref 36.0–46.0)
HCT: 29.2 % — ABNORMAL LOW (ref 36.0–46.0)
HEMATOCRIT: 26.4 % — AB (ref 36.0–46.0)
HEMOGLOBIN: 9.8 g/dL — AB (ref 12.0–15.0)
HEMOGLOBIN: 8.8 g/dL — AB (ref 12.0–15.0)
Hemoglobin: 6.6 g/dL — CL (ref 12.0–15.0)
MCH: 32 pg (ref 26.0–34.0)
MCH: 30.6 pg (ref 26.0–34.0)
MCH: 30.2 pg (ref 26.0–34.0)
MCHC: 33.7 g/dL (ref 30.0–36.0)
MCHC: 33.6 g/dL (ref 30.0–36.0)
MCHC: 33.3 g/dL (ref 30.0–36.0)
MCV: 95.1 fL (ref 78.0–100.0)
MCV: 91.3 fL (ref 78.0–100.0)
MCV: 90.7 fL (ref 78.0–100.0)
Platelets: 98 10*3/uL — ABNORMAL LOW (ref 150–400)
Platelets: 103 10*3/uL — ABNORMAL LOW (ref 150–400)
Platelets: 101 10*3/uL — ABNORMAL LOW (ref 150–400)
RBC: 2.06 MIL/uL — ABNORMAL LOW (ref 3.87–5.11)
RBC: 3.2 MIL/uL — AB (ref 3.87–5.11)
RBC: 2.91 MIL/uL — ABNORMAL LOW (ref 3.87–5.11)
RDW: 15.3 % (ref 11.5–15.5)
RDW: 17.3 % — ABNORMAL HIGH (ref 11.5–15.5)
RDW: 17 % — ABNORMAL HIGH (ref 11.5–15.5)
WBC: 4.7 10*3/uL (ref 4.0–10.5)
WBC: 6.3 10*3/uL (ref 4.0–10.5)
WBC: 5.2 10*3/uL (ref 4.0–10.5)

## 2017-05-07 LAB — HEPATIC FUNCTION PANEL
ALBUMIN: 2.3 g/dL — AB (ref 3.5–5.0)
ALK PHOS: 28 U/L — AB (ref 38–126)
ALT: 7 U/L — ABNORMAL LOW (ref 14–54)
AST: 16 U/L (ref 15–41)
Bilirubin, Direct: <0.1 mg/dL — ABNORMAL LOW (ref 0.1–0.5)
Total Bilirubin: 0.3 mg/dL (ref 0.3–1.2)
Total Protein: 3.9 g/dL — ABNORMAL LOW (ref 6.5–8.1)

## 2017-05-07 LAB — SAVE SMEAR

## 2017-05-07 LAB — TROPONIN I: Troponin I: <0.03 ng/mL (ref <0.03)

## 2017-05-07 LAB — PREPARE RBC (CROSSMATCH)

## 2017-05-07 LAB — MRSA PCR SCREENING: MRSA BY PCR: NEGATIVE

## 2017-05-07 LAB — POC OCCULT BLOOD, ED: Fecal Occult Bld: POSITIVE — AB

## 2017-05-07 LAB — LIPASE, BLOOD: Lipase: 26 U/L (ref 11–51)

## 2017-05-07 LAB — LACTATE DEHYDROGENASE: LDH: 229 U/L — ABNORMAL HIGH (ref 98–192)

## 2017-05-07 MED ORDER — PANTOPRAZOLE SODIUM 40 MG IV SOLR
40.0000 mg | Freq: Two times a day (BID) | INTRAVENOUS | Status: DC
  Administered 2017-05-07 – 2017-05-10 (×7): 40 mg via INTRAVENOUS
  Filled 2017-05-07 (×9): qty 40

## 2017-05-07 MED ORDER — IOHEXOL 300 MG/ML  SOLN
100.0000 mL | Freq: Once | INTRAMUSCULAR | Status: AC | PRN
  Administered 2017-05-07: 100 mL via INTRAVENOUS

## 2017-05-07 MED ORDER — NYSTATIN 100000 UNIT/GM EX POWD
Freq: Once | CUTANEOUS | Status: AC
  Administered 2017-05-07: 12:00:00 via TOPICAL
  Filled 2017-05-07: qty 15

## 2017-05-07 MED ORDER — SODIUM CHLORIDE 0.9 % IV SOLN
Freq: Once | INTRAVENOUS | Status: AC
  Administered 2017-05-07: 03:00:00 via INTRAVENOUS

## 2017-05-07 NOTE — Progress Notes (Addendum)
Subjective: Patient seen laying comfortably in bed on rounds this AM in no acute distress. States that CP and intermittent SOB have slightly improved since admission. Patient continues to endorse RUQ/epigastric pain. Additional history taken this AM revealed that patient has epigastric discomfort after eating that is relieved with use of Tums. Patient states this pain has been worsening leading up to admission and that she has progressed to using TUMS daily.   Objective:  Vital signs in last 24 hours: Vitals:   05/07/17 0700 05/07/17 0716 05/07/17 0730 05/07/17 0830  BP: (!) 108/54 (!) 104/58 (!) 108/55 (!) 95/53  Pulse: 76 72 75 75  Resp: 12 17 19 17   Temp:  98 F (36.7 C)    TempSrc:      SpO2: 93% 100% 100% 100%   Physical Exam  Constitutional: She appears well-developed and well-nourished. No distress.  HENT:  Mouth/Throat: Oropharynx is clear and moist.  Eyes: Conjunctivae and EOM are normal.  Cardiovascular: Normal rate and regular rhythm. Exam reveals no friction rub.  No murmur heard. 2+ radial pulses, dorsalis pedis bilaterally  Respiratory: Effort normal.  Breath sounds in anterior lung fields clear bilaterally  GI: Soft. Bowel sounds are normal. She exhibits no distension. There is no tenderness. There is no rebound and no guarding.  Musculoskeletal: She exhibits edema (trace around ankles bilaterally). She exhibits no tenderness (of bilateral lower extremities).  Skin: Skin is warm and dry. No rash noted. She is not diaphoretic. No erythema.  Chronic acquired absence of 5 toenails on LLE. Small varicose veins on lower extremities bilaterally. No signs of overlying erythema or calor. Errythmea of skin in pannus along lower abdomen.   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 Patient admitted to the internal medicine teaching service for management with GI consulting. The specific problems addressed during admission are as follows:   Symptomatic anemia 2/2 suspected upper GI bleed: Patient presented with Hgb of 7.9, which was significantly below last reported value of 12.0 on 01/2017. Patient has previous history of iron deficiency anemia. Patient does not report frank hematemsis or hematochezia. Patient states stool output hasn't changed color recently, but difficult to notice because chronic rifampin use turns stools red. Initial FOBT negative, but repeat testing after CBC did not respond appropriately to 1 unit of pRBC's overnight (Hgb 7.9 -> post transfusion Hgb 6.6) was positive. Per nursing report patient had 1 black, tarry bowel movement while in ED, suspicious for ongoing upper GI bleed. Patient does not use NSAID's regularly but does have history of reflux symptoms (dypsepsia after eating meals that is relieved with Tums). [facility] consulted this AM to assist with EGD for ongoing bleed. Patient will have a total of 3 pRBC's transfused since admission. Will follow Hgb closely.  -GI consulted, recommendations appreciated -NPO for now -F/u post transfusion H/H -Transition to IV pantoprazole 40 mg BID for suspected ongoing GIB -Repeat CBC @6  pm, continue to transfuse as need -Will start daily oral iron supplementation once tolerating PO  Abdominal pain: Patient reports pain in RUQ/epigastric region which was initially concerning to the patient for pancreatitis. CT A/P  did not show signs of pancreatitis, CMP and lipase WNL. Patient had early signs consistent with diverticulitis on imaging, but no clinical signs/symptoms of infection (afebrile, no leukocytosis, abdominal exam without  tenderness/guarding). Patient on chronic antibiotics for h/o bacteremia as below. Will continue these for now and clinically monitor.  -Continue to monitor for clinical signs/symptoms of infection -Monitor CBC for leukocytosis  Near syncope, othostatic hypotension: Most likely related to symptomatic anemia/volume depletion. Pacemaker interrogated in ED without evidence of arrhythmia.  -Continue gentle fluids while NPO (LR @ rate 75 ml/hr) -Repeat orthostatic VS after transfusions  Atypical chest pain: Patient's CP on admission more consistent with non-cardiac etiology, such as symptomatic anemia or GERD, rather than cardiac in nature. Further supporting non-cardiac etiology is the patient's initial EKG that was without acute changes to suggest ischemia and serial troponin levels that have been negative thus far.  -F/u final troponin -Avoid SLN in setting of ongoing hypotension -Tylenol PRN for pain relief  Intertrigo of abdomen: Incidentally noted on exam this AM. -Keep wound clean, dry and intact -Topical nystatin powder  H/o cardiac device infection, bacteremia, and septic joint: Patient on chronic keflex 500 mg QID and rifampin 300 mg BID for previous history of infection. Will remain on IV formulations now pending NPO status and further GI workup. Will transition back to oral medications when patient reliably tolerating PO.  -Continue IV ceftriaxone 2g q24hr and IV rifampin 300mg  BID  Combined systolic and diastolic HF (LVEF = 30%, G1DD 65/4650): No current signs or symptoms of HF exacerbation. Patient remains hypotensive in setting of possible ongoing GI bleed, so will hold home coreg 12.75 BID, lasix 20 mg PRN, entresto 1 tablet BID, spirohnolactone 12.5 mg daily,  -Continue to monitor for signs/symptoms of volume overload while inpatient -Resume home medications when BP allows  Hypothyroidism: Holding home levothyroxine daily while NPO. -Resume home medication when  tolerating PO  FEN/GI: -NPO -LR @ rate of 75 ml/hr, replace electrolytes as needed -IV protonix  VTE Prophylaxis: Ambulate as tolerated, holding in setting of ongoing GI bleed Code Status: Full  Dispo: Anticipated discharge in approximately 1-3 day(s).   Rozann Lesches, MD 05/07/2017, 9:04 AM Pager: [phone]

## 2017-05-07 NOTE — Progress Notes (Signed)
Paged that patient's ICD/pacemaker was misfiring. Presented to bedside for further evaluation. Upon arrival patient is hemodynamically stable resting comfortably in bed and voices concerns that her ICD is firing. She states that it is not painful but consistent. Never had anything like this before. Nursing at bedside is touching the patient during these "firings."   On telemetry review I do not appreciate any ICD firing spikes. Pacemaker spikes present. Spoke with cardiology on the phone, felt this is unlikely to be the ICD firing given absent spikes on tele, patient resting comfortably, and nursing ability to touch patient during these episodes. Nursing has reached out to the rep from [employer] who will be by to evaluate and interrogate the patient's pacemaker/ICD.

## 2017-05-07 NOTE — Progress Notes (Signed)
MD was paged about pacemaker/ICD misfiring and constantly setting off. Patient can feel it and it is showing on her Monitor. MD stated he will come to assess. [employer] was called.

## 2017-05-07 NOTE — ED Notes (Signed)
Pt had large dark tarry stool. Assisted off bedpan. Stool card taken to lab to recheck for blood per protocol.

## 2017-05-07 NOTE — Significant Event (Signed)
Rapid Response Event Note  Overview: Time Called: 1747 Arrival Time: 1749 Event Type: Cardiac  Initial Focused Assessment: Called by RN d/t patient c/o ICD/PPM misfiring. Assessed patient, do not appreciated extra PM spikes or extra QRS on monitor.  On auscultation, heart and breath sounds normal. Patient in regular paced rhythm with rare PVCs. Vital signs stable. Patient c/o feeling "extra beats" in her left flank area. Observed and felt some type of involuntary-appearing movement around left diaphragm area, but again, does not appear to have any involvement with rhythm on the monitor. Dr Caron Presume, entered and assessed patient while bedside RN and myself at bedside.  Interventions: Called [employer] representative Leta Jungling) and notified her of physician request to come and interrogate ICD. Monitored rhythm and vital signs. Notified RN to call if needed.  Plan of Care (if not transferred):  Event Summary: Name of Physician Notified: Dr Caron Presume at 1750  Outcome: Stayed in room, stable. Awaiting [employer] for interrogation  Event End Time: 1830  Sumeet Geter E Ahmadou Bolz

## 2017-05-07 NOTE — ED Notes (Signed)
Attempted report 

## 2017-05-07 NOTE — ED Notes (Signed)
Dr. Darcella Cheshire. Notified of pts large bowel movements.

## 2017-05-07 NOTE — Progress Notes (Signed)
Called for diaphragmatic stimulation from patients BiV pacemaker.  On review, she was seen yesterday by Dr Graciela Husbands and LV lead threshold was noted to be 4V @ 0.4 msec.  She was programmed 4.5V @0 .4 msec.  Today, she has developed diaphragmatic stimulation with this change.  CXR from yesterday compared to post implantation reveals relatively stable lead position.  Interrogation this evening reveals normal device function.  She has a MDT Percepta Quad CRT-P biv pacemaker programmed DDD.  RA and RV lead parameters are stable when compared to prior.   LV lead was causing diaphragmatic stim with current pacing (programmed 4.5V @ ).  16 vectors were evaluated for LV threshold/ diaphragm.  There was no capture on pole 4.  Lead 2 and 3 did have a fair bit of diaphragmatic stim and elevated thresholds.  LV 1-2 configuration had threshold of 2.25V @1  msec.  I have therefore programmed with this configuration with 3V @1  msec. Today.  Will alert Dr Graciela Husbands of the change. No further inpatient EP workup planned. Call with questions.  Hillis Range MD, [facility] 05/07/2017 8:05 PM

## 2017-05-07 NOTE — ED Notes (Signed)
Pt ambulated to and from restroom with this RN, without difficulty. Pt had a large loose black bowel movement. Primary RN Lowella Bandy notified.

## 2017-05-07 NOTE — Progress Notes (Signed)
[name] [name] is a 82 y.o. female patient admitted from ED awake, alert - oriented  X 4 - no acute distress noted.  VSS - Blood pressure (!) 128/58, pulse 80, temperature 97.7 F (36.5 C), temperature source Oral, resp. rate 17, height 4\' 10"  (1.473 m), weight 76.6 kg (168 lb 14 oz), SpO2 100 %.    IV in place, occlusive dsg intact without redness.  Orientation to room, and floor completed with information packet given to patient/family.  Patient declined safety video at this time.  Admission INP armband ID verified with patient/family, and in place.   SR up x 2, fall assessment complete, with patient and family able to verbalize understanding of risk associated with falls, and verbalized understanding to call nsg before up out of bed.  Call light within reach, patient able to voice, and demonstrate understanding.  Skin, clean-dry- intact without evidence of bruising, or skin tears.   No evidence of skin break down noted on exam.     Will cont to eval and treat per MD orders.  Marca Ancona, RN 05/07/2017 2:24 PM

## 2017-05-07 NOTE — ED Notes (Signed)
Pt had dark brown outpt in bedpan. Have had secretary page admitting MD.

## 2017-05-07 NOTE — Consult Note (Signed)
Lab Results  Component Value Date   HGB 9.8 (L) 05/07/2017   HGB 6.6 (LL) 05/07/2017   HGB 7.9 (L) 05/06/2017   HGB 12.0 01/08/2017   HCT 29.2 (L) 05/07/2017   HCT 19.6 (L) 05/07/2017   HCT 23.5 (L) 05/06/2017   HCT 36.4 01/08/2017   ALKPHOS 28 (L) 05/07/2017   ALKPHOS 39 05/06/2017   ALKPHOS 81 08/28/2016   AST 16 05/07/2017   AST 18 05/06/2017   AST 14 11/25/2016   ALT 7 (L) 05/07/2017   ALT 11 (L) 05/06/2017   ALT 8 11/25/2016   Subjective:   HPI  The patient is a 82 year old female with multiple medical problems as stated below. She presented to the emergency room after leaving her cardiologist's office yesterday because of weakness falls and chest pain.she was found to be orthostatic in the cardiology's office.in the emergency room she was found to have a hemoglobin of 7.9. She has been having some heartburn and indigestion recently for which she takes Tums. She denied any signs of gastrointestinal bleeding but states that she has been on a medication for about a year which makes her stools a reddish color. More recently over the last day she is noticed a change in that stool color to a more blackish color. She had a colonoscopy in the past by Dr. Randa Evens and has a history of adenomatous polyps.    Past Medical History:  Diagnosis Date  . 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 Acute lower GI bleeding 05/06/2017  . Anemia 05/06/2017  . Arthritis    "finger joints" (06/21/2016)  . Asthma   . CHF (congestive heart failure) (HCC)   . Congestive dilated cardiomyopathy (HCC) 03/31/2014   a. s/p Biotronik CRTD 06/2014  . GERD (gastroesophageal reflux disease)   . Headache   . History of blood transfusion 1990s   "related to hysterectomy"  . Hypothyroidism   . Left bundle branch block   . MSSA bacteremia 06/21/2016  . Myocardial infarction (HCC)   . Pancreatitis, acute    Past Surgical  History:  Procedure Laterality Date  . ABDOMINAL HYSTERECTOMY    . APPENDECTOMY    . BIV PACEMAKER INSERTION CRT-P N/A 01/17/2017   Procedure: BIV PACEMAKER INSERTION CRT-P;  Surgeon: Duke Salvia, MD;  Location: [facility] INVASIVE CV LAB;  Service: Cardiovascular;  Laterality: N/A;  . CARDIAC CATHETERIZATION    . CARDIAC DEFIBRILLATOR PLACEMENT  ?2016  . CATARACT EXTRACTION W/ INTRAOCULAR LENS  IMPLANT, BILATERAL Bilateral   . CHOLECYSTECTOMY OPEN     "related to pancreatitis"  . DILATION AND CURETTAGE OF UTERUS    . EP IMPLANTABLE DEVICE N/A 07/04/2014   Procedure: BiV ICD Insertion CRT-D;  Surgeon: Duke Salvia, MD;  Location: [facility] INVASIVE CV LAB;  Service: Cardiovascular;  Laterality: N/A;  . HERNIA REPAIR    . I&D KNEE WITH POLY EXCHANGE Right 08/11/2016   Procedure: IRRIGATION AND DEBRIDEMENT KNEE WITH POLY EXCHANGE;  Surgeon: Samson Frederic, MD;  Location: [facility] OR;  Service: Orthopedics;  Laterality: Right;  . INSERT / REPLACE / REMOVE PACEMAKER  ?2016  . JOINT REPLACEMENT Bilateral    "thumb joints; used my own material"  . JOINT REPLACEMENT    . LEFT HEART CATHETERIZATION WITH CORONARY ANGIOGRAM N/A 11/02/2013   Procedure: LEFT HEART CATHETERIZATION WITH CORONARY ANGIOGRAM;  Surgeon: Corky Crafts, MD;  Location: [facility] CATH LAB;  Service: Cardiovascular;  Laterality: N/A;  . LOOP RECORDER IMPLANT N/A 04/13/2014   Procedure: LOOP RECORDER IMPLANT;  Surgeon: Duke Salvia, MD;  Location: [facility] CATH LAB;  Service: Cardiovascular;  Laterality: N/A;  . LOOP RECORDER REMOVAL  ?2016  . POSTERIOR LUMBAR FUSION  05/03/2016   Lumbar Two-Three, Lumbar Three-Four Posterior Lumbar Fusion withLaminectomy and Foraminotomy  . SPINE SURGERY  04/2016   lumbar   . TEE WITHOUT CARDIOVERSION N/A 08/14/2016   Procedure: TRANSESOPHAGEAL ECHOCARDIOGRAM (TEE);  Surgeon: Lars Masson, MD;  Location: [facility] ENDOSCOPY;  Service: Cardiovascular;  Laterality: N/A;  . TONSILLECTOMY    . TOTAL HIP ARTHROPLASTY  Bilateral   . UMBILICAL HERNIA REPAIR  1970s?   Social History   Socioeconomic History  . Marital status: Married    Spouse name: Not on file  . Number of children: Not on file  . Years of education: Not on file  . Highest education level: Not on file  Occupational History  . Not on file  Social Needs  . Financial resource strain: Not on file  . Food insecurity:    Worry: Not on file    Inability: Not on file  . Transportation needs:    Medical: Not on file    Non-medical: Not on file  Tobacco Use  . Smoking status: Never Smoker  . Smokeless tobacco: Never Used  . Tobacco comment: "smoked as a teenager; for maybe 1 wk"  Substance and Sexual Activity  . Alcohol use: Yes    Comment: 6/15//2018 "glass of wine q 6 months or so"  . Drug use: No  . Sexual activity: Not on file  Lifestyle  . Physical activity:    Days per week: Not on file    Minutes per session: Not on file  . Stress: Not on file  Relationships  . Social connections:    Talks on phone: Not on file    Gets together: Not on file    Attends religious service: Not on file    Active member of club or organization: Not on file    Attends meetings of clubs or organizations: Not on file    Relationship status: Not on file  . Intimate partner violence:    Fear of current or ex partner: Not on file    Emotionally abused: Not on file    Physically abused: Not on file    Forced sexual activity: Not on file  Other Topics Concern  . Not on file  Social History Narrative  . Not on file   family history includes Arrhythmia in her mother; Heart attack in her father, maternal grandfather, and paternal grandfather; Heart disease in her father; Hyperlipidemia in her mother.  Current Facility-Administered Medications:  .  0.9 %  sodium chloride infusion, , Intravenous, Continuous, Nyra Market, MD, Stopped at 05/06/17 2358 .  acetaminophen (TYLENOL) tablet 650 mg, 650 mg, Oral, Q6H PRN, 650 mg at 05/07/17 0332 **OR**  acetaminophen (TYLENOL) suppository 650 mg, 650 mg, Rectal, Q6H PRN, Samuella Cota, Gorica, MD .  cefTRIAXone (ROCEPHIN) 2 g in sodium chloride 0.9 % 100 mL IVPB, 2 g, Intravenous, Q24H, Nyra Market, MD, Stopped at 05/06/17 2330 .  lactated ringers infusion, , Intravenous, Continuous, Svalina, Gorica, MD, Last Rate: 75 mL/hr at 05/07/17 1418 .  naphazoline-pheniramine (NAPHCON-A) 0.025-0.3 % ophthalmic solution 1-2 drop, 1-2 drop, Both Eyes, QID PRN, Svalina, Gorica, MD .  pantoprazole (PROTONIX) injection 40 mg, 40 mg, Intravenous, Q12H, [name], MD, 40 mg at 05/07/17  1001 .  rifampin (RIFADIN) 300 mg in sodium chloride 0.9 % 100 mL IVPB, 300 mg, Intravenous, Q12H, Nyra Market, MD, Stopped at 05/07/17 1113 Allergies  Allergen Reactions  . Sulfa Antibiotics Hives     Objective:     BP (!) 128/58 (BP Location: Right Arm)   Pulse 80   Temp 97.7 F (36.5 C) (Oral)   Resp 17   Ht 4\' 10"  (1.473 m)   Wt 76.6 kg (168 lb 14 oz)   LMP  (LMP Unknown)   SpO2 100%   BMI 35.29 kg/m   Alert and oriented  No acute distress  Heart regular rhythm no murmurs  Lungs clear  Abdomen soft and nontender    Laboratory No components found for: D1    Assessment:     Anemia  GI bleed, I suspect that with her problems of heartburn requiring Tums and recently noticing some dark-colored stools that we could be dealing with a ulcer.      Plan:     The patient appears stable at this time. She has received blood in the emergency room. Hemoglobin has come up appropriately. Would recommend PPI therapy. Follow clinically. We will plan EGD tomorrow, sooner if the need arises.

## 2017-05-08 ENCOUNTER — Inpatient Hospital Stay (HOSPITAL_COMMUNITY): Payer: Medicare Other | Admitting: Certified Registered Nurse Anesthetist

## 2017-05-08 ENCOUNTER — Encounter (HOSPITAL_COMMUNITY): Payer: Self-pay | Admitting: Certified Registered Nurse Anesthetist

## 2017-05-08 ENCOUNTER — Encounter (HOSPITAL_COMMUNITY)
Admission: EM | Disposition: A | Discharge status: Home / Self Care | Payer: Self-pay | Source: Home / Self Care | Attending: Internal Medicine

## 2017-05-08 DIAGNOSIS — D62 Acute posthemorrhagic anemia: Secondary | ICD-10-CM

## 2017-05-08 DIAGNOSIS — K264 Chronic or unspecified duodenal ulcer with hemorrhage: Principal | ICD-10-CM

## 2017-05-08 DIAGNOSIS — Z950 Presence of cardiac pacemaker: Secondary | ICD-10-CM

## 2017-05-08 DIAGNOSIS — R110 Nausea: Secondary | ICD-10-CM

## 2017-05-08 HISTORY — PX: ESOPHAGOGASTRODUODENOSCOPY (EGD) WITH PROPOFOL: SHX5813

## 2017-05-08 LAB — CBC
HCT: 25.1 % — ABNORMAL LOW (ref 36.0–46.0)
HCT: 23.4 % — ABNORMAL LOW (ref 36.0–46.0)
Hemoglobin: 8.3 g/dL — ABNORMAL LOW (ref 12.0–15.0)
Hemoglobin: 7.8 g/dL — ABNORMAL LOW (ref 12.0–15.0)
MCH: 30.4 pg (ref 26.0–34.0)
MCH: 30.8 pg (ref 26.0–34.0)
MCHC: 33.1 g/dL (ref 30.0–36.0)
MCHC: 33.3 g/dL (ref 30.0–36.0)
MCV: 91.9 fL (ref 78.0–100.0)
MCV: 92.5 fL (ref 78.0–100.0)
PLATELETS: 88 10*3/uL — AB (ref 150–400)
PLATELETS: 112 10*3/uL — AB (ref 150–400)
RBC: 2.73 MIL/uL — ABNORMAL LOW (ref 3.87–5.11)
RBC: 2.53 MIL/uL — ABNORMAL LOW (ref 3.87–5.11)
RDW: 17.3 % — ABNORMAL HIGH (ref 11.5–15.5)
RDW: 17 % — ABNORMAL HIGH (ref 11.5–15.5)
WBC: 4.4 10*3/uL (ref 4.0–10.5)
WBC: 5.2 10*3/uL (ref 4.0–10.5)

## 2017-05-08 LAB — HOMOCYSTEINE: Homocysteine: 10.7 umol/L (ref 0.0–15.0)

## 2017-05-08 SURGERY — ESOPHAGOGASTRODUODENOSCOPY (EGD) WITH PROPOFOL
Anesthesia: Monitor Anesthesia Care

## 2017-05-08 MED ORDER — PROPOFOL 10 MG/ML IV BOLUS
INTRAVENOUS | Status: DC | PRN
  Administered 2017-05-08: 50 mg via INTRAVENOUS
  Administered 2017-05-08: 15 mg via INTRAVENOUS

## 2017-05-08 MED ORDER — FENTANYL CITRATE (PF) 100 MCG/2ML IJ SOLN
INTRAMUSCULAR | Status: AC
  Filled 2017-05-08: qty 2

## 2017-05-08 MED ORDER — SODIUM CHLORIDE 0.9 % IJ SOLN
PREFILLED_SYRINGE | INTRAMUSCULAR | Status: DC | PRN
  Administered 2017-05-08: 1.5 mL

## 2017-05-08 MED ORDER — NYSTATIN 100000 UNIT/GM EX POWD
Freq: Every day | CUTANEOUS | Status: DC
  Administered 2017-05-08 – 2017-05-09 (×2): via TOPICAL
  Filled 2017-05-08: qty 15

## 2017-05-08 MED ORDER — DIPHENHYDRAMINE HCL 25 MG PO CAPS
50.0000 mg | ORAL_CAPSULE | Freq: Four times a day (QID) | ORAL | Status: DC | PRN
  Administered 2017-05-09: 50 mg via ORAL
  Filled 2017-05-08: qty 2

## 2017-05-08 MED ORDER — SODIUM CHLORIDE 0.9 % IV SOLN
INTRAVENOUS | Status: DC
  Administered 2017-05-08: 13:00:00 via INTRAVENOUS

## 2017-05-08 MED ORDER — PROPOFOL 500 MG/50ML IV EMUL
INTRAVENOUS | Status: DC | PRN
  Administered 2017-05-08: 60 ug/kg/min via INTRAVENOUS

## 2017-05-08 MED ORDER — FENTANYL CITRATE (PF) 100 MCG/2ML IJ SOLN
25.0000 ug | Freq: Once | INTRAMUSCULAR | Status: AC
  Administered 2017-05-08: 25 ug via INTRAVENOUS

## 2017-05-08 MED ORDER — MORPHINE SULFATE (PF) 2 MG/ML IV SOLN
1.0000 mg | INTRAVENOUS | Status: DC | PRN
  Administered 2017-05-08 (×2): 1 mg via INTRAVENOUS
  Filled 2017-05-08 (×2): qty 1

## 2017-05-08 MED ORDER — EPINEPHRINE PF 1 MG/10ML IJ SOSY
PREFILLED_SYRINGE | INTRAMUSCULAR | Status: AC
  Filled 2017-05-08: qty 10

## 2017-05-08 MED ORDER — LIDOCAINE HCL (CARDIAC) PF 100 MG/5ML IV SOSY
PREFILLED_SYRINGE | INTRAVENOUS | Status: DC | PRN
  Administered 2017-05-08: 50 mg via INTRAVENOUS

## 2017-05-08 MED ORDER — METOPROLOL TARTRATE 5 MG/5ML IV SOLN
INTRAVENOUS | Status: AC
  Filled 2017-05-08: qty 5

## 2017-05-08 MED ORDER — ONDANSETRON HCL 4 MG/2ML IJ SOLN
4.0000 mg | Freq: Three times a day (TID) | INTRAMUSCULAR | Status: DC
  Administered 2017-05-08 – 2017-05-10 (×7): 4 mg via INTRAVENOUS
  Filled 2017-05-08 (×6): qty 2

## 2017-05-08 MED ORDER — PHENYLEPHRINE HCL 10 MG/ML IJ SOLN
INTRAMUSCULAR | Status: DC | PRN
  Administered 2017-05-08: 80 ug via INTRAVENOUS
  Administered 2017-05-08 (×2): 120 ug via INTRAVENOUS
  Administered 2017-05-08: 80 ug via INTRAVENOUS

## 2017-05-08 SURGICAL SUPPLY — 15 items

## 2017-05-08 NOTE — Op Note (Signed)
[facility] [name] Procedure Date : 05/08/2017 MRN: [id number] Attending MD: Graylin Shiver , MD Date of Birth: [birth year] CSN: [id number] Age: 82 Admit Type: Inpatient Procedure:                Upper GI endoscopy Indications:              Acute post hemorrhagic anemia, Melena Providers:                Graylin Shiver, MD, Clearnce Sorrel, RN, Verita Schneiders, Technician Referring MD:              Medicines:                Propofol per Anesthesia Complications:            No immediate complications. Estimated Blood Loss:     Estimated blood loss: none. Procedure:                Pre-Anesthesia Assessment:                           - Prior to the procedure, a History and Physical                            was performed, and patient medications and                            allergies were reviewed. The patient's tolerance of                            previous anesthesia was also reviewed. The risks                            and benefits of the procedure and the sedation                            options and risks were discussed with the patient.                            All questions were answered, and informed consent                            was obtained. Prior Anticoagulants: The patient has                            taken aspirin. ASA Grade Assessment: II - A patient                            with mild systemic disease. After reviewing the                            risks and benefits, the patient was deemed in  satisfactory condition to undergo the procedure.                           After obtaining informed consent, the endoscope was                            passed under direct vision. Throughout the                            procedure, the patient's blood pressure, pulse, and                            oxygen saturations were monitored continuously. The   EG-[id number] ([id number]) scope was introduced through the                            mouth, and advanced to the second part of duodenum.                            The upper GI endoscopy was accomplished without                            difficulty. The patient tolerated the procedure                            well. Scope In: Scope Out: Findings:      The examined esophagus was normal.      The entire examined stomach was normal.      Three cratered duodenal ulcers were found in the duodenal bulb. One was       small and in the pyloric channel area. The other was linear and in the       bulb. The third was cratered in the distal bulb and had a redish flat       base with some oozing of blood. I injected 1.5cm of 1:10,000 epinehrine       in and around this site and oozing stopped. This ulcer was at an acute       angle and the scope kept flipping back and hard to hold in place. No       clip could be placed. Impression:               - Normal esophagus.                           - Normal stomach.                           - Multiple duodenal ulcers.                           - No specimens collected. Recommendation:           - Clear liquid diet. Procedure Code(s):        --- Professional ---                           [phone], Esophagogastroduodenoscopy, flexible,  transoral; diagnostic, including collection of                            specimen(s) by brushing or washing, when performed                            (separate procedure) Diagnosis Code(s):        --- Professional ---                           K26.9, Duodenal ulcer, unspecified as acute or                            chronic, without hemorrhage or perforation                           D62, Acute posthemorrhagic anemia                           K92.1, Melena (includes Hematochezia) CPT copyright 2017 American Medical Association. All rights reserved. The codes documented in this report are preliminary  and upon coder review may  be revised to meet current compliance requirements. Graylin Shiver, MD 05/08/2017 2:46:43 PM This report has been signed electronically. Number of Addenda: 0

## 2017-05-08 NOTE — Progress Notes (Signed)
Subjective: Patient seen laying comfortably in bed on rounds this AM in no acute distress. Patient endorses some nausea, no vomiting. Drank some clear liquids yesterday but has been NPO since midnight. Patient continues to have black bowel movements but no frank bright red blood.   Objective:  Vital signs in last 24 hours: Vitals:   05/07/17 1651 05/07/17 1952 05/07/17 2352 05/08/17 0500  BP: (!) 121/52 (!) 129/59 (!) 108/55   Pulse: 82 75 76 75  Resp: 20 20 15 16   Temp: (!) 97.5 F (36.4 C) 98.2 F (36.8 C) 98.6 F (37 C)   TempSrc: Oral Oral Oral   SpO2: 100% 99% 100% 100%  Weight:      Height:       Physical Exam  Constitutional: She appears well-developed and well-nourished. No distress.  HENT:  Mouth/Throat: Oropharynx is clear and moist.  Eyes: Conjunctivae are normal.  Cardiovascular: Normal rate, regular rhythm and intact distal pulses.  Respiratory: Effort normal. No respiratory distress. She has no wheezes. She has no rales.  GI: Soft. Bowel sounds are normal. She exhibits no distension. There is tenderness (mild tenderness in epigastric region, improved from yesterday's exam).  Musculoskeletal: She exhibits no edema (of bilateral lower extremitites) or tenderness (of bilateral lower extremitites).  Skin: Skin is warm and dry. She is not diaphoretic. No erythema.  Psychiatric: She has a normal mood and affect. Her behavior is normal.   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 Patient admitted to the internal medicine teaching service for management with GI consulting. The specific problems addressed during  admission are as follows:   Symptomatic anemia 2/2 likely upper GI bleed: Patient FOBT + in ED yesterday with ongoing black tarry stools. Patient received total of 3 units pRBCs with appropriate response in hemoglobin to 9.9 yesterday afternoon. Patient's AM Hgb dropped to 8.3, consistent with ongoing slow upper GI bleed. Patient scheduled for EGD today and is currently NPO.  -GI consulted, recommendations appreciated -Telemetry -NPO for procedure today -Continue IV pantoprazole 40 mg BID -Daily CBC, transfusion goal of >7 -Will start daily oral iron supplementation after procedure -Repeat orthostatic vital signs after procedure  Epigastric abdominal pain, nausea: Patient initially reported RUQ/Epigastric pain which was concerning to her for pancreatitis. Patient's lipase WNL and CT without signs of pancreatic inflammation. CT did, however, show mild possible early diverticulitis. Patient clinically without lower abdominal pain and without clinical signs/symptoms of infection.  -Continue to monitor -Zofran 4 mg q8 hours PRN  Intertrigo of abdomen: Improving. -Keep wound clean, dry and intact -Topical nystatin powder daily  H/o cardiac device infection, bacteremia, and septic joint: Patient on chronic keflex 500 mg QID and rifampin 300 mg BID for previous history of infection. Will remain on IV formulations now pending NPO status and transition to home regimen after procedure.  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 Patient remains normotensive, so will continue to hold home coreg 12.75 BID, lasix 20 mg PRN, entresto 1 tablet BID, and  spirohnolactone 12.5 mg daily PRN.  -Continue to monitor for signs/symptoms of volume overload while inpatient -Resume home medications when BP allows  Hypothyroidism: Holding home levothyroxine daily while NPO. -Resume  home medication when tolerating PO  FEN/GI: -NPO, CLD and slow advance as tolerated after procedure -LR @ rate of 75 ml/hr, d/c after procedure -IV protonix  VTE Prophylaxis: Ambulate as tolerated, SCDs while in bed Code Status: Full  Dispo: Anticipated discharge in approximately 1-2 day(s).   [name] Lesches, MD 05/08/2017, 7:13 AM Pager: [phone]

## 2017-05-08 NOTE — Transfer of Care (Signed)
Immediate Anesthesia Transfer of Care Note  Patient: [name] [name]  Procedure(s) Performed: ESOPHAGOGASTRODUODENOSCOPY (EGD) WITH PROPOFOL (N/A )  Patient Location: Endoscopy Unit  Anesthesia Type:MAC  Level of Consciousness: awake, alert  and patient cooperative  Airway & Oxygen Therapy: Patient Spontanous Breathing  Post-op Assessment: Report given to RN and Post -op Vital signs reviewed and stable  Post vital signs: Reviewed and stable  Last Vitals:  Vitals Value Taken Time  BP    Temp    Pulse 97 05/08/2017  2:01 PM  Resp 19 05/08/2017  2:01 PM  SpO2 100 % 05/08/2017  2:01 PM  Vitals shown include unvalidated device data.  Last Pain:  Vitals:   05/08/17 1228  TempSrc: Oral  PainSc: 0-No pain      Patients Stated Pain Goal: 0 (12/23/22 4695)  Complications: No apparent anesthesia complications

## 2017-05-08 NOTE — Anesthesia Preprocedure Evaluation (Addendum)
Anesthesia Evaluation  Patient identified by MRN, date of birth, ID band Patient awake    Reviewed: Allergy & Precautions, NPO status , Patient's Chart, lab work & pertinent test results, reviewed documented beta blocker date and time   History of Anesthesia Complications Negative for: history of anesthetic complications  Airway Mallampati: I  TM Distance: >3 FB Neck ROM: Full    Dental  (+) Dental Advisory Given   Pulmonary asthma ,  COPD inhaler,    breath sounds clear to auscultation       Cardiovascular hypertension, Pt. on medications and Pt. on home beta blockers (-) angina+ CAD  + dysrhythmias Ventricular Tachycardia + pacemaker (BiV pacer) + Cardiac Defibrillator  Rhythm:Regular Rate:Normal  '18 ECHO: EF 30%, diffuse hypokinesis and hypokinesis/akinesis of the inferior/inferoseptal walls, mild AI, mod MR   Neuro/Psych negative neurological ROS     GI/Hepatic Neg liver ROS, GERD  Controlled,  Endo/Other  Hypothyroidism   Renal/GU negative Renal ROS     Musculoskeletal  (+) Arthritis , Osteoarthritis,    Abdominal (+) + obese,   Peds  Hematology  (+) Blood dyscrasia (Hb 8.3, plt 88k), anemia ,   Anesthesia Other Findings   Reproductive/Obstetrics                            Anesthesia Physical Anesthesia Plan  ASA: III  Anesthesia Plan: MAC   Post-op Pain Management:    Induction:   PONV Risk Score and Plan: 2 and Ondansetron, Dexamethasone and Treatment may vary due to age or medical condition  Airway Management Planned: Natural Airway and Nasal Cannula  Additional Equipment:   Intra-op Plan:   Post-operative Plan:   Informed Consent: I have reviewed the patients History and Physical, chart, labs and discussed the procedure including the risks, benefits and alternatives for the proposed anesthesia with the patient or authorized representative who has indicated his/her  understanding and acceptance.   Dental advisory given  Plan Discussed with: CRNA and Surgeon  Anesthesia Plan Comments: (Plan routine monitors, MAC Magnet available if electrocautery required)        Anesthesia Quick Evaluation

## 2017-05-08 NOTE — Anesthesia Postprocedure Evaluation (Signed)
Anesthesia Post Note  Patient: Ebony Hail  Procedure(s) Performed: ESOPHAGOGASTRODUODENOSCOPY (EGD) WITH PROPOFOL (N/A )     Patient location during evaluation: Endoscopy Anesthesia Type: MAC Level of consciousness: awake and alert, oriented and patient cooperative Pain management: pain level controlled (abd pain improving ) Vital Signs Assessment: post-procedure vital signs reviewed and stable Respiratory status: spontaneous breathing, nonlabored ventilation and respiratory function stable Cardiovascular status: blood pressure returned to baseline and stable Postop Assessment: no apparent nausea or vomiting Anesthetic complications: no    Last Vitals:  Vitals:   05/08/17 1435 05/08/17 1440  BP: (!) 132/41 (!) 135/42  Pulse: (!) 111 77  Resp: (!) 21 19  Temp:    SpO2: 98% 100%    Last Pain:  Vitals:   05/08/17 1440  TempSrc:   PainSc: 6                  Kenlynn Houde,E. Aryiah Monterosso

## 2017-05-08 NOTE — Progress Notes (Signed)
  Date: 05/08/2017  [name]  Medical record number: [id number]  Date of birth: May 22, 1935   I have seen and evaluated this patient and I have discussed the plan of care with the house staff. Please see their note for complete details. I concur with their findings with the following additions/corrections: [name] was seen on AM rounds. Now s/o EGD - 3 cratered duodenal ulcers found and injected with epi per Dr Evette Cristal. She is now on thin liquids. hgB 6.6 - 3 units - 9.8 - 8.8 - 8.3. Observe overnight and reassess HgB in AM. Likely D/C on 5/3 on oral iron and PO PPI.   [name], MD 05/08/2017, 3:37 PM

## 2017-05-08 NOTE — Anesthesia Procedure Notes (Signed)
Procedure Name: MAC Date/Time: 05/08/2017 1:32 PM Performed by: White, Amedeo Plenty, CRNA Pre-anesthesia Checklist: Patient identified, Emergency Drugs available, Suction available and Patient being monitored Patient Re-evaluated:Patient Re-evaluated prior to induction Oxygen Delivery Method: Simple face mask

## 2017-05-08 NOTE — H&P (Signed)
The patient presents to the endoscopy department for EGD because of melena, heartburn, and heme positive stool  Physical: No distress, nonicteric graft heart regular rhythm no murmurs  Lungs clear  Abdomen soft and nontender  Impression: Melena, heme-positive stool, anemia  Plan: EGD

## 2017-05-09 ENCOUNTER — Encounter (HOSPITAL_COMMUNITY): Payer: Self-pay | Admitting: Gastroenterology

## 2017-05-09 LAB — CBC
HEMATOCRIT: 20.6 % — AB (ref 36.0–46.0)
HEMATOCRIT: 27.5 % — AB (ref 36.0–46.0)
HEMOGLOBIN: 6.7 g/dL — AB (ref 12.0–15.0)
HEMOGLOBIN: 9.2 g/dL — AB (ref 12.0–15.0)
MCH: 30.3 pg (ref 26.0–34.0)
MCH: 30.1 pg (ref 26.0–34.0)
MCHC: 32.5 g/dL (ref 30.0–36.0)
MCHC: 33.5 g/dL (ref 30.0–36.0)
MCV: 93.2 fL (ref 78.0–100.0)
MCV: 89.9 fL (ref 78.0–100.0)
Platelets: 98 10*3/uL — ABNORMAL LOW (ref 150–400)
Platelets: 102 10*3/uL — ABNORMAL LOW (ref 150–400)
RBC: 2.21 MIL/uL — AB (ref 3.87–5.11)
RBC: 3.06 MIL/uL — ABNORMAL LOW (ref 3.87–5.11)
RDW: 16.8 % — ABNORMAL HIGH (ref 11.5–15.5)
RDW: 16.5 % — ABNORMAL HIGH (ref 11.5–15.5)
WBC: 4.2 10*3/uL (ref 4.0–10.5)
WBC: 5.3 10*3/uL (ref 4.0–10.5)

## 2017-05-09 LAB — PREPARE RBC (CROSSMATCH)

## 2017-05-09 LAB — BASIC METABOLIC PANEL
ANION GAP: 4 — AB (ref 5–15)
BUN: 24 mg/dL — ABNORMAL HIGH (ref 6–20)
CHLORIDE: 112 mmol/L — AB (ref 101–111)
CO2: 25 mmol/L (ref 22–32)
Calcium: 7.7 mg/dL — ABNORMAL LOW (ref 8.9–10.3)
Creatinine, Ser: 0.97 mg/dL (ref 0.44–1.00)
GFR calc non Af Amer: 54 mL/min — ABNORMAL LOW (ref >60)
Glucose, Bld: 95 mg/dL (ref 65–99)
Potassium: 3.3 mmol/L — ABNORMAL LOW (ref 3.5–5.1)
SODIUM: 141 mmol/L (ref 135–145)

## 2017-05-09 LAB — METHYLMALONIC ACID(MMA), RND URINE
CREATININE(CRT), U: 0.68 g/L (ref 0.30–3.00)
METHYLMALONIC ACID UR: 10.9 umol/L (ref 1.6–29.7)
MMA - Normalized: 1.8 umol/mmol cr (ref 0.5–3.4)

## 2017-05-09 MED ORDER — POTASSIUM CHLORIDE CRYS ER 20 MEQ PO TBCR
20.0000 meq | EXTENDED_RELEASE_TABLET | Freq: Two times a day (BID) | ORAL | Status: AC
  Administered 2017-05-09 (×2): 20 meq via ORAL
  Filled 2017-05-09 (×2): qty 1

## 2017-05-09 MED ORDER — FERROUS SULFATE 325 (65 FE) MG PO TABS
325.0000 mg | ORAL_TABLET | Freq: Every day | ORAL | Status: DC
  Administered 2017-05-09 – 2017-05-10 (×2): 325 mg via ORAL
  Filled 2017-05-09 (×2): qty 1

## 2017-05-09 MED ORDER — MIDAZOLAM HCL 2 MG/2ML IJ SOLN: 0.5000 mg | Freq: Once | INTRAMUSCULAR | Status: DC | PRN

## 2017-05-09 MED ORDER — RIFAMPIN 300 MG PO CAPS
300.0000 mg | ORAL_CAPSULE | Freq: Two times a day (BID) | ORAL | Status: DC
  Administered 2017-05-09 – 2017-05-10 (×3): 300 mg via ORAL
  Filled 2017-05-09 (×5): qty 1

## 2017-05-09 MED ORDER — MEPERIDINE HCL 25 MG/ML IJ SOLN: 6.2500 mg | INTRAMUSCULAR | Status: DC | PRN

## 2017-05-09 MED ORDER — PROMETHAZINE HCL 25 MG/ML IJ SOLN: 6.2500 mg | INTRAMUSCULAR | Status: DC | PRN

## 2017-05-09 MED ORDER — CEPHALEXIN 500 MG PO CAPS
500.0000 mg | ORAL_CAPSULE | Freq: Four times a day (QID) | ORAL | Status: DC
  Administered 2017-05-09 – 2017-05-10 (×6): 500 mg via ORAL
  Filled 2017-05-09 (×5): qty 1

## 2017-05-09 MED ORDER — SUCRALFATE 1 G PO TABS
1.0000 g | ORAL_TABLET | Freq: Four times a day (QID) | ORAL | Status: AC
  Administered 2017-05-10 (×2): 1 g via ORAL
  Filled 2017-05-09 (×2): qty 1

## 2017-05-09 MED ORDER — SODIUM CHLORIDE 0.9 % IV SOLN
Freq: Once | INTRAVENOUS | Status: AC
  Administered 2017-05-09: 11:00:00 via INTRAVENOUS

## 2017-05-09 MED ORDER — SODIUM CHLORIDE 0.9 % IV SOLN: Freq: Once | INTRAVENOUS | Status: DC

## 2017-05-09 MED ORDER — LEVOTHYROXINE SODIUM 112 MCG PO TABS
112.0000 ug | ORAL_TABLET | Freq: Every day | ORAL | Status: DC
  Administered 2017-05-09 – 2017-05-10 (×2): 112 ug via ORAL
  Filled 2017-05-09 (×2): qty 1

## 2017-05-09 NOTE — Progress Notes (Signed)
CRITICAL VALUE ALERT  Critical Value:  Hemoglobin 6.7  Date & Time Notied:  05/09/2017 @0453   Provider Notified: Alinda Money  Orders Received/Actions taken: no new order yet

## 2017-05-09 NOTE — Progress Notes (Signed)
Pt complain of itching, new medication that was given was PRN MORHINE 1MG  on call physician from [facility] paged gave an order for benadryl, med given will continue to monitor pt

## 2017-05-09 NOTE — Progress Notes (Signed)
Subjective: Patient seen laying comfortably in bed on rounds this AM in no acute distress. Patient reports interval improvement in epigastric pain and nausea since yesterday. Patient tolerated clear liquids without nausea/vomiting and feels ready to advance to regular solid foods, as she is very hungry. Patient had one black bowel movement very early this AM, no hematemesis or frank red blood.  Objective:  Vital signs in last 24 hours: Vitals:   05/09/17 0401 05/09/17 0554 05/09/17 0625 05/09/17 0922  BP: (!) 106/49 (!) 104/45 (!) 109/54 (!) 97/48  Pulse:  78 71 77  Resp:  16  (!) 26  Temp: 97.7 F (36.5 C) 98.6 F (37 C) 97.9 F (36.6 C) 98.1 F (36.7 C)  TempSrc: Axillary Oral Oral Oral  SpO2:  95%  97%  Weight:      Height:       Physical Exam  Constitutional: She appears well-developed and well-nourished. No distress.  HENT:  Mouth/Throat: Oropharynx is clear and moist.  Eyes: Conjunctivae are normal.  Cardiovascular: Normal rate, regular rhythm and intact distal pulses.  Respiratory: Effort normal.  Lungs clear to auscultation bilaterally in anterior lung fields.  GI: Soft. Bowel sounds are normal. She exhibits no distension. There is no tenderness (mild tenderness in epigastric region, improved from yesterday's exam).  Musculoskeletal: She exhibits no edema (of bilateral lower extremitites) or tenderness (of bilateral lower extremitites).  Skin: Skin is warm and dry. She is not diaphoretic. No erythema.  Psychiatric: She has a normal mood and affect. Her behavior is normal.   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 Patient  admitted to the internal medicine teaching service for management with GI consulting. The specific problems addressed during admission are as follows:   Acute blood loss anemia 2/2 upper GI bleed: EGD showed three duodenal ulcers, one of which was bleeding and successfully injected with epinephrine. Patient continues to have drop in Hgb (6.7 from 8.3 yesterday AM) after procedure. Will provide 2 units pRBC's today and check H/H post transfusion. Since the procedure was late in the day and bleeding ulcer was successfully intervened on, I am hopeful that Hgb drop is a delayed effect from pre-procedure ongoing GI bleed. Will continue to monitor patient overnight for stabilization of Hgb levels and/or resolution of tarry black stools. Patient to continue pantoprazole as outpatient. Will also test for possibility of H. Pylori exposure since no biopsy on yesterday's EGD.  -GI consulted, recommendations appreciated -F/u H.Pylori antibodies, will need stool antigen if positive -CLD, advance as tolerated to [facility] diet -Continue IV pantoprazole 40 mg BID -Daily CBC, transfusion goal of >7 -Daily oral iron sulfate 325 starting tomorrow -Repeat orthostatic vital signs  Epigastric abdominal pain, nausea: Improving with pantoprazole and PRN anti-emetics. -Zofran 4 mg q8 hours PRN  H/o cardiac device infection, bacteremia, and septic joint: Chronic antibiotic therapy for history of hardware infection, bacteremia. 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 Patient remains normotensive, so will continue to hold home coreg 12.75 BID, lasix 20 mg PRN, entresto 1  tablet BID, and spirohnolactone 12.5 mg daily PRN.  -Resume home medications when BP allows  Hypothyroidism: Holding home levothyroxine daily while NPO. -Resume home levothyroxine 112 mcg daily -Will need to instruct patient on  how to correctly take levothyroxine while on PPI  FEN/GI: -Advance to [facility] as tolerated  -No IVF, K+ replacement today -IV protonix  VTE Prophylaxis: Ambulate as tolerated, SCDs while in bed Code Status: Full  Dispo: Anticipated discharge in approximately 1-2 day(s).   Rozann Lesches, MD 05/09/2017, 9:36 AM Pager: [phone]

## 2017-05-09 NOTE — Plan of Care (Signed)
Blood transfusion started with no reaction seen, will continue to monitor pt

## 2017-05-09 NOTE — Progress Notes (Signed)
Pt HB 6.7 on call physician from [facility] Dr Alinda Money notified no new order yet will continue to monitor

## 2017-05-09 NOTE — Progress Notes (Signed)
  Date: 05/09/2017  Patient name: KINBERLY PENALVER  Medical record number: [id number]  Date of birth: [birth year]   I have seen and evaluated this patient and I have discussed the plan of care with the house staff. Please see their note for complete details. I concur with their findings with the following additions/corrections: Ms Drye was seen on AM team rounds. Unfortunately, her HgB dropped from high post transfusion of 9.8 to 6.7. She is getting 2 units. If she cont to shows signs of blood loss, GI might need to repeat her endoscopy. She did not have bx for H Pylori so we have ordered IgG Ab - the only H pylori test not affected by GI bleeding or PPI. If negative, she doesn't have H pylori. If +, it does not mean acute infxn - we would need stool Ag for that but it can be affected by PPI. One her HgB is stable, she will be able to go home on PPI and Fe SO4.   [name], MD 05/09/2017, 4:38 PM

## 2017-05-09 NOTE — Discharge Summary (Addendum)
Name: [name] [name] MRN: [id number] DOB: June 12, 1935 82 y.o. PCP: [name] Mask, MD  Date of Admission: 05/06/2017  3:33 PM Date of Discharge: 05/10/2017 Attending Physician: [name], MD  Discharge Diagnosis: Upper GI bleed 2/2 duodenal ulcers, s/p epinephrine injection    Principal Problem:   GI bleed Active Problems:   Chest pain   Anemia   Abdominal pain   Orthostatic hypotension  Discharge Medications: Allergies as of 05/10/2017      Reactions   Sulfa Antibiotics Hives      Medication List    STOP taking these medications   aspirin EC 81 MG tablet   aspirin-acetaminophen-caffeine 250-250-65 MG tablet Commonly known as:  EXCEDRIN MIGRAINE   diphenhydrAMINE 12.5 MG/5ML elixir Commonly known as:  BENADRYL   docusate sodium 100 MG capsule Commonly known as:  COLACE   metoCLOPramide 5 MG tablet Commonly known as:  REGLAN   senna 8.6 MG Tabs tablet Commonly known as:  SENOKOT     TAKE these medications   ALLERGY EYE OP Place 1-2 drops into both eyes daily as needed (for irritated eyes).   alum & mag hydroxide-simeth 200-200-20 MG/5ML suspension Commonly known as:  MAALOX/MYLANTA Take 30 mLs by mouth every 4 (four) hours as needed for indigestion.   betamethasone dipropionate 0.05 % ointment Commonly known as:  DIPROLENE Apply 1 application topically daily as needed.   carvedilol 12.5 MG tablet Commonly known as:  COREG Take 1.5 tablets (18.75 mg total) 2 (two) times daily by mouth. What changed:  how much to take   cephALEXin 500 MG capsule Commonly known as:  KEFLEX Take 1 capsule (500 mg total) by mouth 4 (four) times daily.   feeding supplement (ENSURE ENLIVE) Liqd Take 237 mLs by mouth 2 (two) times daily between meals. What changed:  when to take this   ferrous sulfate 325 (65 FE) MG tablet Take 1 tablet (325 mg total) by mouth daily with breakfast. Start taking on:  05/11/2017   furosemide 20 MG tablet Commonly known  as:  LASIX Take 1 tablet (20 mg total) by mouth daily. What changed:    when to take this  reasons to take this   levothyroxine 112 MCG tablet Commonly known as:  SYNTHROID, LEVOTHROID Take 112 mcg by mouth daily before breakfast.   Magnesium 200 MG Tabs Take 200 mg by mouth daily.   Melatonin 3 MG Tabs Take 3 mg by mouth at bedtime as needed (for sleep.).   menthol-cetylpyridinium 3 MG lozenge Commonly known as:  CEPACOL Take 1 lozenge (3 mg total) by mouth as needed for sore throat (sore throat). What changed:  additional instructions   methocarbamol 500 MG tablet Commonly known as:  ROBAXIN Take 1 tablet (500 mg total) by mouth every 6 (six) hours as needed for muscle spasms.   methylphenidate 20 MG tablet Commonly known as:  RITALIN Take 20 mg by mouth daily.   nitroGLYCERIN 0.4 MG SL tablet Commonly known as:  NITROSTAT Place 1 tablet (0.4 mg total) every 5 (five) minutes as needed under the tongue for chest pain.   ondansetron 4 MG tablet Commonly known as:  ZOFRAN Take 1 tablet (4 mg total) by mouth every 6 (six) hours as needed for nausea.   pantoprazole 40 MG tablet Commonly known as:  PROTONIX Take 1 tablet (40 mg total) by mouth 2 (two) times daily before a meal.   polyethylene glycol packet Commonly known as:  MIRALAX / GLYCOLAX Take 17 g  by mouth daily as needed for mild constipation.   PROAIR HFA 108 (90 Base) MCG/ACT inhaler Generic drug:  albuterol Inhale 2 puffs into the lungs every 6 (six) hours as needed for wheezing or shortness of breath.   rifampin 300 MG capsule Commonly known as:  RIFADIN TAKE 1 CAPSULE(300 MG) BY MOUTH EVERY 12 HOURS   sacubitril-valsartan 24-26 MG Commonly known as:  ENTRESTO Take 1 tablet by mouth 2 (two) times daily.   spironolactone 25 MG tablet Commonly known as:  ALDACTONE Take 0.5 tablets (12.5 mg total) by mouth daily. What changed:    when to take this  reasons to take this   sucralfate 1 g  tablet Commonly known as:  CARAFATE Take 1 tablet (1 g total) by mouth 3 (three) times daily with meals as needed (for worsening epigastric pain/indigestion after eating).   TUMS PO Take 1 tablet by mouth daily as needed (indigestion).       Disposition and follow-up:   [name] was discharged from [facility] in Stable condition.  At the hospital follow up visit please address:  1.  Patient admitted with symptomatic anemia 2/2 duodenal GI bleed. Patient had multiple duodenal ulcers observed on EGD, one of which was actively bleeding and treated during EGD. Please assess for signs/symptoms of anemia. Patient also started on BID PPI and oral iron for treatment of her PUD and anemia, respectively. Please assess compliance with these medications.   2.  Labs / imaging needed at time of follow-up: CBC to monitor hemoglobin  3.  Pending labs/ test needing follow-up: H. Pylori IgG  Follow-up Appointments: Follow-up Information    [name] Mask, MD. Call.   Specialty:  Family Medicine Why:  Please call this physician on Monday to schedule a follow up appointment for evaluation after leaving the hospital. At this appointment your physician should check your blood levels to make sure that you do not have worsening of your low blood levels. Contact information: [address] [place] [address] [phone]        [name] Masson, MD .   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  Patient admitted to the internal medicine teaching service for management with GI consulting. The specific problems addressed during admission are as follows:   Acute blood loss anemia 2/2 upper GI bleed: Patient presented with Hgb of 7.9, which was significantly below last reported value of 12.0 on 01/2017. She also reported symptoms of orthostasis, SOB with exertion, intermittent chest pain, and was orthostatic vital signs+ in ED. Patient did not report hematochezia, hematuria, or hematemesis leading up to admission. Patient was also unsure if her stool output had become more dark in color, as her chronic use of rifampin turns daily stools red. FOBT testing in ED was positive and after this testing, patient began to have multiple black, tarry bowel regimens per day early during the hospitalization. Patient did not endorse regular NSAID use but did endorse history of  dypsepsia after eating meals that is relieved with Tums, which suggested PUD. Patient received 3 units pRBC's on HD#1 with response in Hgb to 9.8. She underwent EGD on HD#2 which showed three duodenal ulcers, one of which was actively bleeding. The actively bleeding ulcer was injected with epinephrine during procedure and immediate hemostasis was observed. Her hemoglobin, however, continued to drop after the procedure to 6.9 on HD #3 and the patient received 2 additional units of pRBC's as a result. On the day of discharge her hemoglobin was stable around 9 for 24 hours and the patient had clinically improved, as her symptoms of orthostasis, chest pain, and dypsnea on exertion that were present on admission resolved with transfusion on HD #1. Patient was discharged with BID PPI to be continued for at least 8 weeks and PRN carafate to use for relief of intermittent epigastric pain after eating. Please consider continuing PPI after 2 months, as patient endorsed symptoms of uncontrolled GERD in the weeks leading up to admission. Patient also discharged  with daily oral iron supplementation. Please reassess the need for this therapy once hemoglobin normalized. At discharge a H. Pylori IgG was pending, as biopsies were unable to be obtained during EGD 2/2 technical difficulty and location of the ulcers. If patient IgG elevated, please consider further testing and treatment for H. Pylori infection.   Atypical chest pain: Patient endorsed chest pain on admission that was more consistent with non-cardiac etiology, such as symptomatic anemia or GERD, rather than cardiac chest pain. Further supporting non-cardiac etiology was the fact that the patient's initial EKG was without acute changes to suggest ischemia and serial troponin levels were within normal limits x3.   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 The patient remained without signs or symptoms of HF exacerbation during hospitalization. She was normotensive throughout the hospitalization off these medications. Her usual home medications were resumed on discharge.  H/o cardiac device infection, bacteremia, and septic joint: Patient on chronic antibiotic therapy for history of hardware infection, bacteremia, and septic joint. Patient remained on chronic antibiotic therapy during hospitalization, although she was treated with IV formulations while inpatient and not tolerating PO intake.   Discharge Vitals:   BP 96/68   Pulse 67   Temp 98.1 F (36.7 C) (Oral)   Resp 14   Ht 4\' 10"  (1.473 m)   Wt 168 lb 14 oz (76.6 kg)   LMP  (LMP Unknown)   SpO2 98%   BMI 35.29 kg/m   Pertinent Labs, Studies, and Procedures:   BMP Latest Ref Rng & Units 05/10/2017 05/09/2017 05/07/2017  Glucose 65 - 99 mg/dL 045(W) 95 86  BUN 6 - 20 mg/dL 19 09(W) 11(B)  Creatinine 0.44 - 1.00 mg/dL 1.47 8.29 5.62(Z)   BUN/Creat Ratio 12 - 28 - - -  Sodium 135 - 145 mmol/L 141 141 141  Potassium 3.5 - 5.1 mmol/L 4.3 3.3(L) 4.1  Chloride 101 - 111 mmol/L 110 112(H) 112(H)  CO2 22 - 32 mmol/L 25 25 22   Calcium 8.9 - 10.3 mg/dL 8.2(L) 7.7(L) 7.6(L)   CBC Latest Ref Rng & Units 05/10/2017 05/10/2017 05/09/2017  WBC 4.0 - 10.5 K/uL 5.2 4.6 5.3  Hemoglobin 12.0 - 15.0 g/dL 3.0(Q) 8.9(L) 9.2(L)  Hematocrit 36.0 - 46.0 % 26.9(L) 26.8(L) 27.5(L)  Platelets 150 -  400 K/uL 114(L) 95(L) 102(L)   FOBT stool testing = Positive Vitamin B12 = 227 Folate = 7.6 % Reticulocytes = 2.1%  Iron/TIBC/Ferritin/ %Sat    Component Value Date/Time   IRON 95 05/06/2017 1854   TIBC 260 05/06/2017 1854   FERRITIN 18 05/06/2017 1854   IRONPCTSAT 36 (H) 05/06/2017 1854   Troponin q6 hours x 3 = <0.03 x3  Upper Endoscopy, 05/08/2017 Findings: The entire examined stomach was normal. Three cratered duodenal ulcers were found in the duodenal bulb. One was small and in the pyloric channel area. The other was linear and in the bulb. The third was cratered in the distal bulb and had a redish flat base with some oozing of blood. I injected 1.5cm of 1:10,000 epinehrine in and around this site and oozing stopped. This ulcer was at an acute angle and the scope kept flipping back and hard to hold in place. No clip could be placed. Impression: - Normal esophagus. - Normal stomach. - Multiple duodenal ulcers. - No specimens collected.  Discharge Instructions: Discharge Instructions    Call MD for:  difficulty breathing, headache or visual disturbances   Complete by:  As directed    Call MD for:  persistant dizziness or light-headedness   Complete by:  As directed    Call MD for:  persistant nausea and vomiting   Complete by:  As directed    Call MD for:  temperature >100.4   Complete by:  As directed    Diet - low sodium heart healthy   Complete by:  As directed    Discharge instructions   Complete by:  As directed    You were  evaluated for weakness, shortness of breath, fatigue, and chest pain. You were found to have low hemoglobin (red blood cells) as a result of an ulcer in the beginning part of your small intestine. This ulcer was repaired during your endoscopy while you were in the hospital and you were given blood transfusions to increase the number of red blood cells you lost as a result of this bleed.   After leaving the hospital you are to take pantoprazole (Protonix) 40 mg twice a day for at least the next 2 months, likely longer. This will help decrease the amount of stomach acid you produce so that the ulcers in your small intestine can heal themselves. You will also need to take oral iron pills (ferrous sulfate, 325 mg) daily. This will help your body replenish your red blood cells after you leave the hospital. Please discontinue use of all aspirin and NSAID therapy (ibuprofen). These medications can worsen ulcers and promote GI bleeding. Please talk to your primary care physician before starting any of these medications.   Please follow up with your primary care provider early next week so that he can check your blood levels to make sure they are stable and starting to recover towards your normal baseline of hemoglobin= 11-12. As discussed prior to discharge, if you have worsened bleeding after leaving the hospital or any additional worrisome symptoms please call a physician for evaluation.   Increase activity slowly   Complete by:  As directed       Signed: Rozann Lesches, MD 05/10/2017, 3:29 PM   Pager: [phone]

## 2017-05-09 NOTE — Progress Notes (Signed)
[facility] Progress Note  Subjective: No specific complaints today. She did have a dark bowel movement early this morning. She had an EGD yesterday showing 3 duodenal ulcers one of which had some oozing and was injected with epinephrine. The bleeding stopped at the time of endoscopy. She had some postprocedure pain in the epigastric area but that is common after epinephrine injection and it was short-lived. No complaints of abdominal pain today.  Objective: Vital signs in last 24 hours: Temp:  [97.7 F (36.5 C)-98.8 F (37.1 C)] 98 F (36.7 C) (05/03 1126) Pulse Rate:  [71-111] 81 (05/03 1126) Resp:  [16-26] 23 (05/03 1126) BP: (81-168)/(41-69) 81/61 (05/03 1126) SpO2:  [95 %-100 %] 100 % (05/03 1126) Weight change:    PE:  No distress  Heart regular rhythm  Lungs clear  Abdomen soft nontender  Hemoglobin noted to have drifted down  Lab Results: Results for orders placed or performed during the hospital encounter of 05/06/17 (from the past 24 hour(s))  CBC     Status: Abnormal   Collection Time: 05/08/17  6:28 PM  Result Value Ref Range   WBC 5.2 4.0 - 10.5 K/uL   RBC 2.53 (L) 3.87 - 5.11 MIL/uL   Hemoglobin 7.8 (L) 12.0 - 15.0 g/dL   HCT 06.2 (L) 37.6 - 28.3 %   MCV 92.5 78.0 - 100.0 fL   MCH 30.8 26.0 - 34.0 pg   MCHC 33.3 30.0 - 36.0 g/dL   RDW 15.1 (H) 76.1 - 60.7 %   Platelets 112 (L) 150 - 400 K/uL  CBC     Status: Abnormal   Collection Time: 05/09/17  3:55 AM  Result Value Ref Range   WBC 4.2 4.0 - 10.5 K/uL   RBC 2.21 (L) 3.87 - 5.11 MIL/uL   Hemoglobin 6.7 (LL) 12.0 - 15.0 g/dL   HCT 37.1 (L) 06.2 - 69.4 %   MCV 93.2 78.0 - 100.0 fL   MCH 30.3 26.0 - 34.0 pg   MCHC 32.5 30.0 - 36.0 g/dL   RDW 85.4 (H) 62.7 - 03.5 %   Platelets 98 (L) 150 - 400 K/uL  Basic metabolic panel     Status: Abnormal   Collection Time: 05/09/17  3:55 AM  Result Value Ref Range   Sodium 141 135 - 145 mmol/L   Potassium 3.3 (L) 3.5 - 5.1 mmol/L   Chloride 112 (H)  101 - 111 mmol/L   CO2 25 22 - 32 mmol/L   Glucose, Bld 95 65 - 99 mg/dL   BUN 24 (H) 6 - 20 mg/dL   Creatinine, Ser 0.09 0.44 - 1.00 mg/dL   Calcium 7.7 (L) 8.9 - 10.3 mg/dL   GFR calc non Af Amer 54 (L) >60 mL/min   GFR calc Af Amer >60 >60 mL/min   Anion gap 4 (L) 5 - 15  Prepare RBC     Status: None   Collection Time: 05/09/17  5:15 AM  Result Value Ref Range   Order Confirmation      ORDER PROCESSED BY BLOOD BANK BB SAMPLE OR UNITS ALREADY AVAILABLE Performed at [facility], [address] [place], [place] [address]   Prepare RBC     Status: None   Collection Time: 05/09/17  7:00 AM  Result Value Ref Range   Order Confirmation      ORDER PROCESSED BY BLOOD BANK BB SAMPLE OR UNITS ALREADY AVAILABLE Performed at [facility], [address], [place], [place]  [address]     Studies/Results: No results found.    Assessment: Upper GI bleed secondary to duodenal ulcer  Plan:   Transfuse blood as needed. Continue PPI therapy. Follow clinically. Watch for further signs of active bleeding. May need repeat endoscopy if there is further sign of active bleeding or continued drop in hemoglobin and hematocrit.    Gwenevere Abbot 05/09/2017, 12:31 PM  Pager: [phone] If no answer or after 5 PM call [phone] Lab Results  Component Value Date   HGB 6.7 (LL) 05/09/2017   HGB 7.8 (L) 05/08/2017   HGB 8.3 (L) 05/08/2017   HGB 12.0 01/08/2017   HCT 20.6 (L) 05/09/2017   HCT 23.4 (L) 05/08/2017   HCT 25.1 (L) 05/08/2017   HCT 36.4 01/08/2017   ALKPHOS 28 (L) 05/07/2017   ALKPHOS 39 05/06/2017   ALKPHOS 81 08/28/2016   AST 16 05/07/2017   AST 18 05/06/2017   AST 14 11/25/2016   ALT 7 (L) 05/07/2017   ALT 11 (L) 05/06/2017   ALT 8 11/25/2016

## 2017-05-10 LAB — TYPE AND SCREEN
ABO/RH(D): A POS
Antibody Screen: POSITIVE
DONOR AG TYPE: NEGATIVE
DONOR AG TYPE: NEGATIVE
DONOR AG TYPE: NEGATIVE
DONOR AG TYPE: NEGATIVE
Donor AG Type: NEGATIVE
Donor AG Type: NEGATIVE
UNIT DIVISION: 0
UNIT DIVISION: 0
UNIT DIVISION: 0
Unit division: 0
Unit division: 0
Unit division: 0
Unit division: 0

## 2017-05-10 LAB — CBC
HEMATOCRIT: 26.8 % — AB (ref 36.0–46.0)
HEMATOCRIT: 26.9 % — AB (ref 36.0–46.0)
HEMOGLOBIN: 8.9 g/dL — AB (ref 12.0–15.0)
HEMOGLOBIN: 8.9 g/dL — AB (ref 12.0–15.0)
MCH: 30.4 pg (ref 26.0–34.0)
MCH: 30.3 pg (ref 26.0–34.0)
MCHC: 33.2 g/dL (ref 30.0–36.0)
MCHC: 33.1 g/dL (ref 30.0–36.0)
MCV: 91.5 fL (ref 78.0–100.0)
MCV: 91.5 fL (ref 78.0–100.0)
PLATELETS: 114 10*3/uL — AB (ref 150–400)
Platelets: 95 10*3/uL — ABNORMAL LOW (ref 150–400)
RBC: 2.93 MIL/uL — AB (ref 3.87–5.11)
RBC: 2.94 MIL/uL — AB (ref 3.87–5.11)
RDW: 16.8 % — ABNORMAL HIGH (ref 11.5–15.5)
RDW: 16.6 % — ABNORMAL HIGH (ref 11.5–15.5)
WBC: 4.6 10*3/uL (ref 4.0–10.5)
WBC: 5.2 10*3/uL (ref 4.0–10.5)

## 2017-05-10 LAB — BPAM RBC
BLOOD PRODUCT EXPIRATION DATE: 201905182359
BLOOD PRODUCT EXPIRATION DATE: 201905182359
BLOOD PRODUCT EXPIRATION DATE: 201905242359
BLOOD PRODUCT EXPIRATION DATE: 201905242359
BLOOD PRODUCT EXPIRATION DATE: 201905282359
Blood Product Expiration Date: 201905242359
Blood Product Expiration Date: 201905282359
ISSUE DATE / TIME: 201904302040
ISSUE DATE / TIME: 201905010300
ISSUE DATE / TIME: 201905031049
ISSUE DATE / TIME: 201905010648
ISSUE DATE / TIME: 201905030604
UNIT TYPE AND RH: 5100
UNIT TYPE AND RH: 5100
Unit Type and Rh: 5100
Unit Type and Rh: 6200
Unit Type and Rh: 6200
Unit Type and Rh: 6200
Unit Type and Rh: 6200

## 2017-05-10 LAB — BASIC METABOLIC PANEL
Anion gap: 6 (ref 5–15)
BUN: 19 mg/dL (ref 6–20)
CO2: 25 mmol/L (ref 22–32)
Calcium: 8.2 mg/dL — ABNORMAL LOW (ref 8.9–10.3)
Chloride: 110 mmol/L (ref 101–111)
Creatinine, Ser: 0.93 mg/dL (ref 0.44–1.00)
GFR calc Af Amer: >60 mL/min (ref >60)
GFR calc non Af Amer: 57 mL/min — ABNORMAL LOW (ref >60)
Glucose, Bld: 100 mg/dL — ABNORMAL HIGH (ref 65–99)
Potassium: 4.3 mmol/L (ref 3.5–5.1)
SODIUM: 141 mmol/L (ref 135–145)

## 2017-05-10 MED ORDER — SUCRALFATE 1 G PO TABS: 1.0000 g | ORAL_TABLET | Freq: Three times a day (TID) | ORAL | 0 refills | Status: DC | PRN

## 2017-05-10 MED ORDER — FERROUS SULFATE 325 (65 FE) MG PO TABS: 325.0000 mg | ORAL_TABLET | Freq: Every day | ORAL | 0 refills | Status: DC

## 2017-05-10 MED ORDER — PANTOPRAZOLE SODIUM 40 MG PO TBEC: 40.0000 mg | DELAYED_RELEASE_TABLET | Freq: Two times a day (BID) | ORAL | 0 refills | Status: DC

## 2017-05-10 NOTE — Progress Notes (Signed)
Patient discharge teaching given, including activity, diet, follow-up appoints, and medications. Patient verbalized understanding of all discharge instructions. IV access was d/c'd. Vitals are stable. Skin is intact except as charted in most recent assessments. Pt to be escorted out by NT, to be driven home by family. 

## 2017-05-10 NOTE — Progress Notes (Signed)
[facility] Progress Note  [name] [name] 82 y.o. 29-Jul-1935  CC:  GI bleed   Subjective:  Patient had 1 episode of black color stool this morning. She denies abdominal pain, nausea vomiting.  ROS : negative for chest pain. Positive for baseline shortness of breath.   Objective: Vital signs in last 24 hours: Vitals:   05/10/17 0412 05/10/17 0458  BP: (!) 95/53 96/68  Pulse:    Resp:    Temp: 98 F (36.7 C)   SpO2:      Physical Exam:  Gen. Alert oriented 3. Not in acute distress Heart. RRR abd : soft, nontender, nondistended, bowel sounds present Psych : mood and affect normal.  Lab Results: Recent Labs    05/09/17 0355 05/10/17 0434  NA 141 141  K 3.3* 4.3  CL 112* 110  CO2 25 25  GLUCOSE 95 100*  BUN 24* 19  CREATININE 0.97 0.93  CALCIUM 7.7* 8.2*   No results for input(s): AST, ALT, ALKPHOS, BILITOT, PROT, ALBUMIN in the last 72 hours. Recent Labs    05/09/17 1607 05/10/17 0434  WBC 5.3 4.6  HGB 9.2* 8.9*  HCT 27.5* 26.8*  MCV 89.9 91.5  PLT 102* 95*   No results for input(s): LABPROT, INR in the last 72 hours.    Assessment/Plan: - upper GI bleed. EGD 05/08/2017 showed 3 duodenal ulcers, one ulcer with evidence of slow oozing of blood which was treated with epinephrine injection. - acute blood loss anemia. Hemoglobin stable.  Recommendations ------------------------- - patient's hemoglobin stable. BUN  trending down but patient had 1 episode of black color stool this morning. - Recheck hemoglobin later today and again in the morning. - Continue IV twice a day PPI for now. - Hopefully discharge tomorrow if hemoglobin remains stable. - GI will follow  Kathi Der MD, FACP 05/10/2017, 9:07 AM  Contact #  [phone]

## 2017-05-10 NOTE — Progress Notes (Addendum)
Subjective: Patient seen sitting comfortably in bedside chair this AM in no acute distress. Patient reports having one dark brown bowel movement overnight, but no black bowel movements since yesterday. Patient without dizziness with standing or walking around room. Symptoms of CP, abdominal pain have resolved since admission and she feels overall stronger since transfusions. No acute complaints. Wants to be discharged home later today.   Addendum: after initial interview patient had one bowel movement that had green black consistency.   Objective:  Vital signs in last 24 hours: Vitals:   05/09/17 2352 05/10/17 0352 05/10/17 0412 05/10/17 0458  BP: (!) 100/45 110/69 (!) 95/53 96/68  Pulse:  67    Resp:  14    Temp: 98.1 F (36.7 C)  98 F (36.7 C)   TempSrc: Oral  Oral   SpO2:  98%    Weight:      Height:       Physical Exam  Constitutional: She appears well-developed and well-nourished. No distress.  HENT:  Mouth/Throat: Oropharynx is clear and moist.  Eyes: Conjunctivae and EOM are normal.  Cardiovascular: Normal rate, regular rhythm, normal heart sounds and intact distal pulses.  Respiratory: Effort normal. No respiratory distress. She has no wheezes. She has no rales.  GI: Soft. Bowel sounds are normal. She exhibits no distension. There is no tenderness (mild tenderness in epigastric region, improved from yesterday's exam).  Musculoskeletal: She exhibits no edema (of bilateral lower extremitites) or tenderness (of bilateral lower extremitites).  Skin: Skin is warm and dry. She is not diaphoretic. No erythema.  Psychiatric: Judgment and thought content normal.   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 Patient admitted to the internal medicine teaching service for management with GI consulting. The specific problems addressed during admission are as follows:   Acute blood loss anemia 2/2 upper GI bleed: Patient has received a total of 5 units pRBC's during hospitalization, with administration of 2 units yesterday. Hgb after 2 units on 5/3 = 9.2, with repeat of 8.9 this AM. Patient's orthostatic vital signs this AM with stable BP at standing, HR increase of 20 with standing @ 3 minutes. Patient asymptomatic during test. Patient without dizziness while walking around room, tolerating normal diet without difficulty, and fells back to baseline for discharge today. Patient had one black bowel movement this AM after having brown bowel movements overnight. Will repeat Hgb this afternoon to ensure that patient not continuing to actively bleed and will discharge later this afternoon on BID PPI, oral iron if Hgb stable.  -GI consulted, recommendations appreciated -Repeat CBC this afternoon -F/u H.Pylori antibodies, will need stool antigen if positive as an outpatient -Continue IV pantoprazole 40 mg BID -Daily oral iron sulfate 325 starting  Epigastric abdominal pain, nausea: Patient required carafate last night for epigastric pain after eating and laying down, consistent with GERD symptoms. This medication provided immediate relief of patient's symptoms. Patient counseled about staying upright after meals and will be given prescription for PRN carafate on d/c. -Carafate PRN on discharge -Zofran 4 mg q8 hours PRN  H/o cardiac device infection, bacteremia, and septic joint: Chronic antibiotic therapy for  history of hardware infection, bacteremia. 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 Patient remains  hypo-normotensive, so will continue to hold home coreg 12.75 BID, lasix 20 mg PRN, entresto 1 tablet BID, and spirohnolactone 12.5 mg daily PRN.  -Resume home medications tomorrow after discharge  Hypothyroidism:  -Home levothyroxine 112 mcg daily, patient counseled on how to appropriately take this medication on empty stomach  FEN/GI: -Advance to [facility] as tolerated  -No IVF, electrolytes within normal limits -IV protonix BID  VTE Prophylaxis: Ambulate as tolerated, SCDs while in bed Code Status: Full  Dispo: Anticipated discharge in approximately 0-1 day.   Rozann Lesches, MD 05/10/2017, 7:05 AM Pager: [phone]

## 2017-05-12 LAB — H. PYLORI ANTIBODY, IGG

## 2017-05-14 ENCOUNTER — Other Ambulatory Visit: Payer: Self-pay | Admitting: Internal Medicine

## 2017-06-01 ENCOUNTER — Other Ambulatory Visit: Payer: Self-pay | Admitting: Cardiology

## 2017-06-01 DIAGNOSIS — Z95810 Presence of automatic (implantable) cardiac defibrillator: Secondary | ICD-10-CM

## 2017-06-01 DIAGNOSIS — I5022 Chronic systolic (congestive) heart failure: Secondary | ICD-10-CM

## 2017-06-04 ENCOUNTER — Ambulatory Visit (INDEPENDENT_AMBULATORY_CARE_PROVIDER_SITE_OTHER): Payer: Medicare Other | Admitting: Infectious Disease

## 2017-06-04 ENCOUNTER — Encounter: Payer: Self-pay | Admitting: Infectious Disease

## 2017-06-04 VITALS — BP 126/70 | HR 67 | Temp 98.0°F | Ht <= 58 in | Wt 163.0 lb

## 2017-06-04 DIAGNOSIS — R7881 Bacteremia: Secondary | ICD-10-CM | POA: Diagnosis not present

## 2017-06-04 DIAGNOSIS — I472 Ventricular tachycardia: Secondary | ICD-10-CM

## 2017-06-04 DIAGNOSIS — I5022 Chronic systolic (congestive) heart failure: Secondary | ICD-10-CM

## 2017-06-04 DIAGNOSIS — T827XXA Infection and inflammatory reaction due to other cardiac and vascular devices, implants and grafts, initial encounter: Secondary | ICD-10-CM | POA: Diagnosis not present

## 2017-06-04 DIAGNOSIS — K922 Gastrointestinal hemorrhage, unspecified: Secondary | ICD-10-CM | POA: Diagnosis not present

## 2017-06-04 DIAGNOSIS — T8453XD Infection and inflammatory reaction due to internal right knee prosthesis, subsequent encounter: Secondary | ICD-10-CM

## 2017-06-04 DIAGNOSIS — Z950 Presence of cardiac pacemaker: Secondary | ICD-10-CM | POA: Diagnosis not present

## 2017-06-04 DIAGNOSIS — T8450XD Infection and inflammatory reaction due to unspecified internal joint prosthesis, subsequent encounter: Secondary | ICD-10-CM

## 2017-06-04 DIAGNOSIS — I4729 Other ventricular tachycardia: Secondary | ICD-10-CM

## 2017-06-04 DIAGNOSIS — B9561 Methicillin susceptible Staphylococcus aureus infection as the cause of diseases classified elsewhere: Secondary | ICD-10-CM

## 2017-06-04 MED ORDER — CEPHALEXIN 500 MG PO CAPS: 1000.0000 mg | ORAL_CAPSULE | Freq: Two times a day (BID) | ORAL | 11 refills | Status: DC

## 2017-06-04 NOTE — Patient Instructions (Signed)
We will get labs today  YOu can change the keflex to two 500mg  = 1000 mg every 12 hours along with continuing your rifampin

## 2017-06-04 NOTE — Progress Notes (Signed)
Subjective:   Follow-up for her MSSA device and prosthetic joint infections   Patient ID: [name] [name], female    DOB: [birth year], 82 y.o.   MRN: [id number]  HPI  82 year old who underwent lumbar laminectomy by Dr. Yetta Barre on 05/03/16. Presented to [facility] 6/7 with fevers, weakness and diffuse pain. She developed sepsis/hypotension and was found to have MSSA bacteremia and transferred to [facility] 6/15 for continued care and assessment by EP team considering she has cardiac device. At [facility] she underwent evaluation for metastatic sites of infection including CT of spine, TTE/TEE and BCx monitoring. All repeat BCx were negative once transferred here. TTE/TEE were both negative for lead or valvular endocarditis at that time; received 4 weeks IV Ancef and decision to leave cardiac device.  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 She underwent single staged exchange arthroplasty on right knee by Dr. Veda Canning. Dr. Ladona Ridgel was not available for device extraction here so she was transferred to [facility] where she had cardiac device extracted on August 10th. She had blood cultures and device cultures done with no growth. PICC inserted on 08/18/16. She was sent out on IV cefazolin 2g IV q 8 but also IV rifampin 300 mg IV q 12 at SNF and then home. Managing FIVE infusions was overwhelming for her husband. I changed her to rifampin 300mg  orally BID and then Ceftriaxone 2gram IV push to complete 42 days of therapy post extraction. Initially her cardiac function had mproved quite nicely. We placed her on high dose keflex and bid rifampin and she has been on these meds without fail since I last saw her. She is approaching in December 4 months of systemic antimicrobials since extraction of her device and a litle more since I and D and exchange of  liner of prosthesis.   Her EF had worsened and EP and Cardiology after careful consideration we implanted a biventricular pacemaker on January 17, 2017.  She is continued on her cephalexin and rifampin.   Since I last saw her she suffered an acute upper GI bleed and had apparently had a syncopal episode.  She underwent endoscopy and was found to have found to have a duodenal ulcer.  This was not sent for H. pylori testing but that serology for H. pylori was sent and this was negative.  She feels dramatically better now than she did several weeks ago when she was hospitalized with severe anemia due to acute GI bleed.  She is otherwise tolerating her cephalexin 4 times a day and rifampin twice a day.  She continues to have some pain and swelling in her right knee but it is not hot and she is not suffering from fevers or other systemic symptoms.    Past Medical History:  Diagnosis Date  . 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 Acute lower GI bleeding 05/06/2017  . Anemia 05/06/2017  . Arthritis    "finger joints" (06/21/2016)  . Asthma   . CHF (congestive heart failure) (HCC)   . Congestive dilated cardiomyopathy (HCC) 03/31/2014   a. s/p Biotronik CRTD 06/2014  . GERD (gastroesophageal reflux disease)   . Headache   . History of blood transfusion 1990s   "related to hysterectomy"  . Hypothyroidism   .  Left bundle branch block   . MSSA bacteremia 06/21/2016  . Myocardial infarction (HCC)   . Pancreatitis, acute     Past Surgical History:  Procedure Laterality Date  . ABDOMINAL HYSTERECTOMY    . APPENDECTOMY    . BIV PACEMAKER INSERTION CRT-P N/A 01/17/2017   Procedure: BIV PACEMAKER INSERTION CRT-P;  Surgeon: Duke Salvia, MD;  Location: [facility] INVASIVE CV LAB;  Service: Cardiovascular;  Laterality: N/A;  . CARDIAC CATHETERIZATION    . CARDIAC DEFIBRILLATOR PLACEMENT  ?2016  . CATARACT  EXTRACTION W/ INTRAOCULAR LENS  IMPLANT, BILATERAL Bilateral   . CHOLECYSTECTOMY OPEN     "related to pancreatitis"  . DILATION AND CURETTAGE OF UTERUS    . EP IMPLANTABLE DEVICE N/A 07/04/2014   Procedure: BiV ICD Insertion CRT-D;  Surgeon: Duke Salvia, MD;  Location: [facility] INVASIVE CV LAB;  Service: Cardiovascular;  Laterality: N/A;  . ESOPHAGOGASTRODUODENOSCOPY (EGD) WITH PROPOFOL N/A 05/08/2017   Procedure: ESOPHAGOGASTRODUODENOSCOPY (EGD) WITH PROPOFOL;  Surgeon: Graylin Shiver, MD;  Location: [facility] ENDOSCOPY;  Service: Endoscopy;  Laterality: N/A;  . HERNIA REPAIR    . I&D KNEE WITH POLY EXCHANGE Right 08/11/2016   Procedure: IRRIGATION AND DEBRIDEMENT KNEE WITH POLY EXCHANGE;  Surgeon: Samson Frederic, MD;  Location: [facility] OR;  Service: Orthopedics;  Laterality: Right;  . INSERT / REPLACE / REMOVE PACEMAKER  ?2016  . JOINT REPLACEMENT Bilateral    "thumb joints; used my own material"  . JOINT REPLACEMENT    . LEFT HEART CATHETERIZATION WITH CORONARY ANGIOGRAM N/A 11/02/2013   Procedure: LEFT HEART CATHETERIZATION WITH CORONARY ANGIOGRAM;  Surgeon: Corky Crafts, MD;  Location: [facility] CATH LAB;  Service: Cardiovascular;  Laterality: N/A;  . LOOP RECORDER IMPLANT N/A 04/13/2014   Procedure: LOOP RECORDER IMPLANT;  Surgeon: Duke Salvia, MD;  Location: [facility] CATH LAB;  Service: Cardiovascular;  Laterality: N/A;  . LOOP RECORDER REMOVAL  ?2016  . POSTERIOR LUMBAR FUSION  05/03/2016   Lumbar Two-Three, Lumbar Three-Four Posterior Lumbar Fusion withLaminectomy and Foraminotomy  . SPINE SURGERY  04/2016   lumbar   . TEE WITHOUT CARDIOVERSION N/A 08/14/2016   Procedure: TRANSESOPHAGEAL ECHOCARDIOGRAM (TEE);  Surgeon: Lars Masson, MD;  Location: [facility] ENDOSCOPY;  Service: Cardiovascular;  Laterality: N/A;  . TONSILLECTOMY    . TOTAL HIP ARTHROPLASTY Bilateral   . UMBILICAL HERNIA REPAIR  1970s?    Family History  Problem Relation Age of Onset  . Hyperlipidemia Mother   . Arrhythmia Mother         palpitations  . Heart disease Father   . Heart attack Father   . Heart attack Maternal Grandfather   . Heart attack Paternal Grandfather       Social History   Socioeconomic History  . Marital status: Married    Spouse name: Not on file  . Number of children: Not on file  . Years of education: Not on file  . Highest education level: Not on file  Occupational History  . Not on file  Social Needs  . Financial resource strain: Not on file  . Food insecurity:    Worry: Not on file    Inability: Not on file  . Transportation needs:    Medical: Not on file    Non-medical: Not on file  Tobacco Use  . Smoking status: Never Smoker  . Smokeless tobacco: Never Used  . Tobacco comment: "smoked as a teenager; for maybe 1 wk"  Substance and Sexual Activity  . Alcohol use: Yes  Comment: 6/15//2018 "glass of wine q 6 months or so"  . Drug use: No  . Sexual activity: Not on file  Lifestyle  . Physical activity:    Days per week: Not on file    Minutes per session: Not on file  . Stress: Not on file  Relationships  . Social connections:    Talks on phone: Not on file    Gets together: Not on file    Attends religious service: Not on file    Active member of club or organization: Not on file    Attends meetings of clubs or organizations: Not on file    Relationship status: Not on file  Other Topics Concern  . Not on file  Social History Narrative  . Not on file    Allergies  Allergen Reactions  . Sulfa Antibiotics Hives     Current Outpatient Medications:  .  albuterol (PROAIR HFA) 108 (90 Base) MCG/ACT inhaler, Inhale 2 puffs into the lungs every 6 (six) hours as needed for wheezing or shortness of breath., Disp: , Rfl:  .  alum & mag hydroxide-simeth (MAALOX/MYLANTA) 200-200-20 MG/5ML suspension, Take 30 mLs by mouth every 4 (four) hours as needed for indigestion., Disp: 355 mL, Rfl: 0 .  betamethasone dipropionate (DIPROLENE) 0.05 % ointment, Apply 1 application  topically daily as needed., Disp: , Rfl:  .  Calcium Carbonate Antacid (TUMS PO), Take 1 tablet by mouth daily as needed (indigestion). , Disp: , Rfl:  .  carvedilol (COREG) 12.5 MG tablet, Take 1.5 tablets (18.75 mg total) 2 (two) times daily by mouth. (Patient taking differently: Take 12.5 mg by mouth 2 (two) times daily. ), Disp: 180 tablet, Rfl: 3 .  cephALEXin (KEFLEX) 500 MG capsule, Take 1 capsule (500 mg total) by mouth 4 (four) times daily., Disp: 120 capsule, Rfl: 11 .  feeding supplement, ENSURE ENLIVE, (ENSURE ENLIVE) LIQD, Take 237 mLs by mouth 2 (two) times daily between meals. (Patient taking differently: Take 237 mLs by mouth daily. ), Disp: 237 mL, Rfl: 12 .  ferrous sulfate 325 (65 FE) MG tablet, Take 1 tablet (325 mg total) by mouth daily with breakfast., Disp: 90 tablet, Rfl: 0 .  furosemide (LASIX) 20 MG tablet, Take 1 tablet (20 mg total) by mouth daily as needed for edema., Disp: 90 tablet, Rfl: 2 .  levothyroxine (SYNTHROID, LEVOTHROID) 112 MCG tablet, Take 112 mcg by mouth daily before breakfast., Disp: , Rfl:  .  Magnesium 200 MG TABS, Take 200 mg by mouth daily., Disp: , Rfl:  .  Melatonin 3 MG TABS, Take 3 mg by mouth at bedtime as needed (for sleep.). , Disp: , Rfl:  .  menthol-cetylpyridinium (CEPACOL) 3 MG lozenge, Take 1 lozenge (3 mg total) by mouth as needed for sore throat (sore throat). (Patient taking differently: Take 1 lozenge by mouth as needed for sore throat (sore throat). Took [place]), Disp: 100 tablet, Rfl: 12 .  methocarbamol (ROBAXIN) 500 MG tablet, Take 1 tablet (500 mg total) by mouth every 6 (six) hours as needed for muscle spasms., Disp: 10 tablet, Rfl: 0 .  methylphenidate (RITALIN) 20 MG tablet, Take 20 mg by mouth daily. , Disp: , Rfl:  .  Naphazoline-Pheniramine (ALLERGY EYE OP), Place 1-2 drops into both eyes daily as needed (for irritated eyes)., Disp: , Rfl:  .  nitroGLYCERIN (NITROSTAT) 0.4 MG SL tablet, Place 1 tablet (0.4 mg total) every 5  (five) minutes as needed under the tongue for chest pain., Disp:  90 tablet, Rfl: 3 .  ondansetron (ZOFRAN) 4 MG tablet, Take 1 tablet (4 mg total) by mouth every 6 (six) hours as needed for nausea., Disp: 20 tablet, Rfl: 0 .  pantoprazole (PROTONIX) 40 MG tablet, Take 1 tablet (40 mg total) by mouth 2 (two) times daily before a meal., Disp: 60 tablet, Rfl: 0 .  polyethylene glycol (MIRALAX / GLYCOLAX) packet, Take 17 g by mouth daily as needed for mild constipation., Disp: 14 each, Rfl: 0 .  rifampin (RIFADIN) 300 MG capsule, TAKE 1 CAPSULE(300 MG) BY MOUTH EVERY 12 HOURS, Disp: 60 capsule, Rfl: 0 .  sacubitril-valsartan (ENTRESTO) 24-26 MG, Take 1 tablet by mouth 2 (two) times daily., Disp: 180 tablet, Rfl: 2 .  spironolactone (ALDACTONE) 25 MG tablet, Take 0.5 tablets (12.5 mg total) by mouth daily. (Patient taking differently: Take 12.5 mg by mouth as needed. ), Disp: 90 tablet, Rfl: 3 .  sucralfate (CARAFATE) 1 g tablet, Take 1 tablet (1 g total) by mouth 3 (three) times daily with meals as needed (for worsening epigastric pain/indigestion after eating)., Disp: 30 tablet, Rfl: 0   Review of Systems  Constitutional: Negative for chills and fever.  HENT: Negative for congestion and sore throat.   Eyes: Negative for photophobia.  Respiratory: Negative for cough, shortness of breath and wheezing.   Cardiovascular: Negative for chest pain, palpitations and leg swelling.  Gastrointestinal: Negative for abdominal pain, blood in stool, constipation, diarrhea, nausea and vomiting.  Genitourinary: Negative for dysuria, flank pain and hematuria.  Musculoskeletal: Positive for arthralgias, back pain and myalgias. Negative for neck pain.  Skin: Positive for wound. Negative for rash.  Neurological: Negative for dizziness, weakness and headaches.  Hematological: Does not bruise/bleed easily.  Psychiatric/Behavioral: Negative for suicidal ideas.       Objective:   Physical Exam  Constitutional: She  is oriented to person, place, and time. She appears well-developed and well-nourished. No distress.  HENT:  Head: Normocephalic and atraumatic.  Mouth/Throat: No oropharyngeal exudate.  Eyes: Conjunctivae and EOM are normal. No scleral icterus.  Neck: Normal range of motion. Neck supple.  Cardiovascular: Normal rate and regular rhythm.  Murmur heard.  Systolic murmur is present. RUSB  Pulmonary/Chest: Effort normal. No respiratory distress. She has no wheezes.    Abdominal: She exhibits no distension.  Musculoskeletal: She exhibits no edema or tenderness.       Legs: Neurological: She is alert and oriented to person, place, and time. She exhibits normal muscle tone. Coordination normal.  Skin: Skin is warm and dry. No rash noted. She is not diaphoretic. No pallor.  Psychiatric: She has a normal mood and affect. Her behavior is normal. Judgment and thought content normal.  Nursing note and vitals reviewed.  New pacemaker generator site: Nicely healed up.  Right prosthetic knee joint surgical scar and joint 01/27/2017:       Jun 04, 2017: Minimally tender not much in the way of effusion      :    Assessment & Plan:    ICD infection: sp extraction and she will have completed 42 days of systemic therapy post extraction and has been on oral abx for her PJI since then space more than 4 months of antimicrobial therapy post extraction now status post reimplantation due to worsening heart failure in the absence of a device she now has a biventricular device in place again.  We will continue out with likely lifelong antimicrobial therapy  IPJI, the standard effort is to get through 6  months of therapy though in many where risk of failure is high I personally continue on for much longer. With re to timing of her device re-implantation one could try to delay it until she had a full 6 months of therapy for the knee hoping that the risk for recurrence in the knee would then be lower and  hence risk for bacteremia. However I would NOT be held hostage by such a concept. If she URGENTLY needs device I would support reimplantation. My intention is to keep her on antibiotics for years if not rest of her life due to my concerns of relapse int he knee and more importantly risk for bacteremia yet again.   PJI sp single staged procedure; higher rate of failure with a 2 step. We will give her protracted oral therapy as stated above likely lifelong  Recent acute GI bleed: Not clear to me what the cause was her H. pylori antibody was tested by the teaching service and found to be negative.  I spent greater than 25 minutes with the patient including greater than 50% of time in face to face counsel of the patient regarding importance of maintaining chronic oral antibiotic therapy to ensure that she does not have relapse of her infection from the knee into the bloodstream and in coordination of her care.

## 2017-06-05 LAB — COMPLETE METABOLIC PANEL WITH GFR
AG Ratio: 1.9 (calc) (ref 1.0–2.5)
ALKALINE PHOSPHATASE (APISO): 46 U/L (ref 33–130)
ALT: 8 U/L (ref 6–29)
AST: 16 U/L (ref 10–35)
Albumin: 4.1 g/dL (ref 3.6–5.1)
BUN/Creatinine Ratio: 20 (calc) (ref 6–22)
BUN: 21 mg/dL (ref 7–25)
CO2: 24 mmol/L (ref 20–32)
CREATININE: 1.03 mg/dL — AB (ref 0.60–0.93)
Calcium: 9.3 mg/dL (ref 8.6–10.4)
Chloride: 105 mmol/L (ref 98–110)
GFR, Est African American: 60 mL/min/{1.73_m2} (ref >=60)
GFR, Est Non African American: 52 mL/min/{1.73_m2} — ABNORMAL LOW (ref >=60)
GLOBULIN: 2.2 g/dL (ref 1.9–3.7)
GLUCOSE: 94 mg/dL (ref 65–99)
Potassium: 4.1 mmol/L (ref 3.5–5.3)
Sodium: 139 mmol/L (ref 135–146)
Total Bilirubin: 0.3 mg/dL (ref 0.2–1.2)
Total Protein: 6.3 g/dL (ref 6.1–8.1)

## 2017-06-05 LAB — SEDIMENTATION RATE: SED RATE: 11 mm/h (ref 0–30)

## 2017-06-05 LAB — C-REACTIVE PROTEIN: CRP: 0.4 mg/L (ref <8.0)

## 2017-06-07 ENCOUNTER — Other Ambulatory Visit: Payer: Self-pay | Admitting: Cardiology

## 2017-07-22 ENCOUNTER — Encounter: Payer: Self-pay | Admitting: *Deleted

## 2017-07-22 ENCOUNTER — Encounter: Payer: Self-pay | Admitting: Cardiology

## 2017-07-22 ENCOUNTER — Ambulatory Visit (INDEPENDENT_AMBULATORY_CARE_PROVIDER_SITE_OTHER): Payer: Medicare Other | Admitting: Cardiology

## 2017-07-22 VITALS — BP 120/70 | HR 71 | Ht <= 58 in | Wt 164.8 lb

## 2017-07-22 DIAGNOSIS — R079 Chest pain, unspecified: Secondary | ICD-10-CM | POA: Diagnosis not present

## 2017-07-22 DIAGNOSIS — R0600 Dyspnea, unspecified: Secondary | ICD-10-CM

## 2017-07-22 LAB — BASIC METABOLIC PANEL
BUN / CREAT RATIO: 17 (ref 12–28)
BUN: 18 mg/dL (ref 8–27)
CO2: 22 mmol/L (ref 20–29)
CREATININE: 1.05 mg/dL — AB (ref 0.57–1.00)
Calcium: 9.8 mg/dL (ref 8.7–10.3)
Chloride: 103 mmol/L (ref 96–106)
GFR, EST AFRICAN AMERICAN: 58 mL/min/{1.73_m2} — AB (ref >59)
GFR, EST NON AFRICAN AMERICAN: 51 mL/min/{1.73_m2} — AB (ref >59)
Glucose: 92 mg/dL (ref 65–99)
Potassium: 4.7 mmol/L (ref 3.5–5.2)
Sodium: 140 mmol/L (ref 134–144)

## 2017-07-22 LAB — CBC
HEMATOCRIT: 35.1 % (ref 34.0–46.6)
HEMOGLOBIN: 12.2 g/dL (ref 11.1–15.9)
MCH: 32.5 pg (ref 26.6–33.0)
MCHC: 34.8 g/dL (ref 31.5–35.7)
MCV: 94 fL (ref 79–97)
Platelets: 156 10*3/uL (ref 150–450)
RBC: 3.75 x10E6/uL — ABNORMAL LOW (ref 3.77–5.28)
RDW: 14.1 % (ref 12.3–15.4)
WBC: 4.6 10*3/uL (ref 3.4–10.8)

## 2017-07-22 LAB — PRO B NATRIURETIC PEPTIDE: NT-Pro BNP: 244 pg/mL (ref 0–738)

## 2017-07-22 NOTE — Patient Instructions (Addendum)
Your physician recommends that you continue on your current medications as directed. Please refer to the Current Medication list given to you today.  Your physician recommends that you return for lab work in:  [place] has requested that you have a lexiscan myoview. For further information please visit [email or web address]. Please follow instruction sheet, as given.  Your physician wants you to follow-up in:  [place] will receive a reminder letter in the mail two months in advance. If you don't receive a letter, please call our office to schedule the follow-up appointment.  CALL OFFICE IF HAVE WEIGHT GAIN OF 3 POUND IN 24 HOUR PERIOD OR 5 POUND GAIN IN A WEEK  Heart Failure and Exercise Heart failure is a condition in which the heart does not fill or pump enough blood and oxygen to support your body and its functions. Heart failure is a long-term (chronic) condition. Living with heart failure can be challenging. However, following your health care provider's instructions about a healthy lifestyle may help improve your symptoms. This includes choosing the right exercise plan. Doing daily physical activity is important after a diagnosis of heart failure. You may have some activity restrictions, so talk to your health care provider before doing any exercises. What are the benefits of exercise? Exercise may:  Make your heart muscles stronger.  Lower your blood pressure.  Lower your cholesterol.  Help you lose weight.  Help your bones stay strong.  Improve your blood circulation.  Help your body use oxygen better. This relieves symptoms such as fatigue and shortness of breath.  Help your mental health by lowering the risk of depression and other problems.  Improve your quality of life.  Decrease your chance of hospital admission for heart failure.  What is an exercise plan? An exercise plan is a set of specific exercises and  training activities. You will work with your health care provider to create the exercise plan that works for you. The plan may include:  Different types of exercises and how to do them.  Cardiac rehabilitation exercises. These are supervised programs that are designed to strengthen your heart.  What are strengthening exercises? Strengthening exercises are a type of physical activity that involves using resistance to improve your muscle strength. Strengthening exercises usually have repetitive motions. These types of exercises can include:  Lifting weights.  Using weight machines.  Using resistance tubes and bands.  Using kettlebells.  Using your body weight, such as doing push-ups or squats.  What are balance exercises? Balance exercises are another type of physical activity. They strengthen the muscles of the back, stomach, and pelvis (core muscles) and improve your balance. They can also lower your risk of falling. These types of exercises can include:  Standing on one leg.  Walking backward, sideways, and in a straight line.  Standing up after sitting, without using your hands.  Shifting your weight from one leg to the other.  Lifting one leg in front of you.  Doing tai chi. This is a type of exercise that uses slow movements and deep breathing.  How can I increase my flexibility? Having better flexibility can keep you from falling. It can also lengthen your muscles, improve your range of motion, and help your joints. You can increase your flexibility by:  Doing tai chi.  Doing yoga.  Stretching.  How much aerobic exercise should I get? Aerobic exercises strengthen your breathing and circulation system and increase your  body's use of oxygen. Examples of aerobic exercise include biking, walking, running, and swimming. Talk to your health care provider to find out how much aerobic exercise is safe for you.  To do these exercises:  Start exercising slowly, limiting the  amount of time at first. You may need to start with 5 minutes of aerobic exercise every day.  Slowly add more minutes until you can safely do at least 30 minutes of exercise at least 4 days a week.  Summary  Daily physical activity is important after a diagnosis of heart failure.  Exercise can make your heart muscles stronger. It also offers other benefits that will improve your health.  Talk to your health care provider before doing any exercises. This information is not intended to replace advice given to you by your health care provider. Make sure you discuss any questions you have with your health care provider. Document Released: 05/07/2016 Document Revised: 05/07/2016 Document Reviewed: 05/07/2016 Elsevier Interactive Patient Education  [employer].  .LE

## 2017-07-22 NOTE — Progress Notes (Signed)
07/22/2017 [name] KRETZSCHMAR   [birth year]  [id number]  Primary Physician [name], MD Primary Cardiologist: Dr. Delton See Electrophysiologist: Dr. Graciela Husbands   Reason for Visit/CC: 6 month f/u for NICM and HTN  HPI:  [name] [name] is a 82 y.o. female who is being seen today for routine 6 month evaluation. She is followed by Dr. Delton See as well as Dr. Graciela Husbands. She has a h/o nonischemic cardiomyopathy, left bundle branch block, nonsustained ventricular tachycardia, HTN and pancreatitis.   She underwent CRT Biotronik implantation  06/2014.  06/2016, she was  hospitalized and found to have staph bacteremia of (MSSA) in the wake of orthopedic surgery.  TEE at [facility] was reportedly negative. She was treated with  6 weeks of antibiotics  She developed recurrent 'septic arthritis" and bacteremia and TEE confirmed Vegetation on RA lead. She was transferred to [facility] for extraction which she tolerated well.   Rx with 8 weeks of IV Abx, most recently 10/18 keflex and rifampin.  Because of worsening left ventricular function and worsening functional status, she underwent CRT reimplantation 01/2017, aware that she would need long-term chronic suppressive antibiotics. Per. Dr. Odessa Fleming recent OV note, she has been considerably better following CRT implantation.  There is less shortness of breath and more energy.  Repeat echo has not yet been done.  She presents to clinic today for routine general cardiology f/u. She notes that her dyspnea has returned with activity. She also notes exertional CP that improves with rest. She denies LEE but notes increased abdominal girth. She has a chronic cough. Reports full compliance with Lasix. No resting CP. Chart reviewed. She had a LHC in 2015 that showed no CAD.    Cardiac Studies 08/14/16: TEE Study Conclusions - Left ventricle: Systolic function was normal. The estimated ejection fraction was in the range of 55% to 60%. Wall motion was normal; there were  no regional wall motion abnormalities. - Aortic valve: No evidence of vegetation. There was mild regurgitation. - Aorta: There was mild non-mobile atheroma. - Mitral valve: There was mild regurgitation. - Left atrium: No evidence of thrombus in the atrial cavity or appendage. No evidence of thrombus in the atrial cavity or appendage. - Right atrium: No evidence of thrombus in the atrial cavity or appendage. No evidence of thrombus in the atrial cavity or appendage. - Tricuspid valve: No evidence of vegetation. There was mild regurgitation. - Pulmonic valve: No evidence of vegetation. Impressions: - There is a highly mobile echodensity measuring 13 x 3 mm attached to the right atrial lead in the right atrium at the tricuspid valve level. This is consistent with pacemaker endocarditis.   10/17/15: limited echo for AV optimization Impressions: - The study is performed for AV optimization There is grade 1 diastolic dysfunction The optimal AV delay seems to be around 90-100 ms but i would defer to the doctors managing the pacer.  02/01/15: TTE Study Conclusions - Left ventricle: The cavity size was normal. There was moderate concentric hypertrophy. Systolic function was normal. The estimated ejection fraction was in the range of 50% to 55%. Wall motion was normal; there were no regional wall motion abnormalities. Doppler parameters are consistent with abnormal left ventricular relaxation (grade 1 diastolic dysfunction). The E/e&' ratio is >15, suggesting elevated LV filling pressure. - Mitral valve: Calcified annulus. Mildly thickened leaflets . There was trivial regurgitation. - Left atrium: The atrium was normal in size. - Right ventricle: The cavity size was normal. Wall thickness was normal. Pacer wire  or catheter noted in right ventricle. Systolic function was normal. - Right atrium: The atrium was normal in size. Pacer wire  or catheter noted in right atrium. - Tricuspid valve: There was moderate regurgitation. - Pulmonary arteries: PA peak pressure: 30 mm Hg (S). - Inferior vena cava: The vessel was normal in size. The respirophasic diameter changes were in the normal range (= 50%), consistent with normal central venous pressure. Impressions: - Compared to a prior study in 09/2014, the EF has improved to 50-55%.    Current Meds  Medication Sig  . albuterol (PROAIR HFA) 108 (90 Base) MCG/ACT inhaler Inhale 2 puffs into the lungs every 6 (six) hours as needed for wheezing or shortness of breath.  [name] alum & mag hydroxide-simeth (MAALOX/MYLANTA) 200-200-20 MG/5ML suspension Take 30 mLs by mouth every 4 (four) hours as needed for indigestion.  . betamethasone dipropionate (DIPROLENE) 0.05 % ointment Apply 1 application topically daily as needed.  . Calcium Carbonate Antacid (TUMS PO) Take 1 tablet by mouth daily as needed (indigestion).   . carvedilol (COREG) 12.5 MG tablet TAKE 1 TABLET TWICE A DAY  . cephALEXin (KEFLEX) 500 MG capsule Take 2 capsules (1,000 mg total) by mouth 2 (two) times daily.  . feeding supplement, ENSURE ENLIVE, (ENSURE ENLIVE) LIQD Take 237 mLs by mouth 2 (two) times daily between meals. (Patient taking differently: Take 237 mLs by mouth daily. )  . ferrous sulfate 325 (65 FE) MG tablet Take 1 tablet (325 mg total) by mouth daily with breakfast.  . furosemide (LASIX) 20 MG tablet Take 1 tablet (20 mg total) by mouth daily as needed for edema.  [name] levothyroxine (SYNTHROID, LEVOTHROID) 112 MCG tablet Take 112 mcg by mouth daily before breakfast.  . Magnesium 200 MG TABS Take 200 mg by mouth daily.  . Melatonin 3 MG TABS Take 3 mg by mouth at bedtime as needed (for sleep.).   [name] menthol-cetylpyridinium (CEPACOL) 3 MG lozenge Take 1 lozenge (3 mg total) by mouth as needed for sore throat (sore throat). (Patient taking differently: Take 1 lozenge by mouth as needed for sore throat (sore  throat). Took [place])  . methocarbamol (ROBAXIN) 500 MG tablet Take 1 tablet (500 mg total) by mouth every 6 (six) hours as needed for muscle spasms.  . methylphenidate (RITALIN) 20 MG tablet Take 20 mg by mouth daily.   March Rummage (ALLERGY EYE OP) Place 1-2 drops into both eyes daily as needed (for irritated eyes).  . ondansetron (ZOFRAN) 4 MG tablet Take 1 tablet (4 mg total) by mouth every 6 (six) hours as needed for nausea.  . pantoprazole (PROTONIX) 40 MG tablet Take 1 tablet (40 mg total) by mouth 2 (two) times daily before a meal.  . polyethylene glycol (MIRALAX / GLYCOLAX) packet Take 17 g by mouth daily as needed for mild constipation.  . rifampin (RIFADIN) 300 MG capsule TAKE 1 CAPSULE(300 MG) BY MOUTH EVERY 12 HOURS  . sacubitril-valsartan (ENTRESTO) 24-26 MG Take 1 tablet by mouth 2 (two) times daily.  . sucralfate (CARAFATE) 1 g tablet Take 1 tablet (1 g total) by mouth 3 (three) times daily with meals as needed (for worsening epigastric pain/indigestion after eating).   Allergies  Allergen Reactions  . Sulfa Antibiotics Hives   Past Medical History:  Diagnosis Date  . 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 Acute lower GI bleeding 05/06/2017  . Acute  lower GI bleeding 05/06/2017  . Anemia 05/06/2017  . Arthritis    "finger joints" (06/21/2016)  . Asthma   . CHF (congestive heart failure) (HCC)   . Congestive dilated cardiomyopathy (HCC) 03/31/2014   a. s/p Biotronik CRTD 06/2014  . GERD (gastroesophageal reflux disease)   . Headache   . History of blood transfusion 1990s   "related to hysterectomy"  . Hypothyroidism   . Left bundle branch block   . MSSA bacteremia 06/21/2016  . Myocardial infarction (HCC)   . Pancreatitis, acute    Family History  Problem Relation Age of Onset  . Hyperlipidemia Mother   . Arrhythmia Mother        palpitations  . Heart disease  Father   . Heart attack Father   . Heart attack Maternal Grandfather   . Heart attack Paternal Grandfather    Past Surgical History:  Procedure Laterality Date  . ABDOMINAL HYSTERECTOMY    . APPENDECTOMY    . BIV PACEMAKER INSERTION CRT-P N/A 01/17/2017   Procedure: BIV PACEMAKER INSERTION CRT-P;  Surgeon: Duke Salvia, MD;  Location: [facility] INVASIVE CV LAB;  Service: Cardiovascular;  Laterality: N/A;  . CARDIAC CATHETERIZATION    . CARDIAC DEFIBRILLATOR PLACEMENT  ?2016  . CATARACT EXTRACTION W/ INTRAOCULAR LENS  IMPLANT, BILATERAL Bilateral   . CHOLECYSTECTOMY OPEN     "related to pancreatitis"  . DILATION AND CURETTAGE OF UTERUS    . EP IMPLANTABLE DEVICE N/A 07/04/2014   Procedure: BiV ICD Insertion CRT-D;  Surgeon: Duke Salvia, MD;  Location: [facility] INVASIVE CV LAB;  Service: Cardiovascular;  Laterality: N/A;  . ESOPHAGOGASTRODUODENOSCOPY (EGD) WITH PROPOFOL N/A 05/08/2017   Procedure: ESOPHAGOGASTRODUODENOSCOPY (EGD) WITH PROPOFOL;  Surgeon: Graylin Shiver, MD;  Location: [facility] ENDOSCOPY;  Service: Endoscopy;  Laterality: N/A;  . HERNIA REPAIR    . I&D KNEE WITH POLY EXCHANGE Right 08/11/2016   Procedure: IRRIGATION AND DEBRIDEMENT KNEE WITH POLY EXCHANGE;  Surgeon: Samson Frederic, MD;  Location: [facility] OR;  Service: Orthopedics;  Laterality: Right;  . INSERT / REPLACE / REMOVE PACEMAKER  ?2016  . JOINT REPLACEMENT Bilateral    "thumb joints; used my own material"  . JOINT REPLACEMENT    . LEFT HEART CATHETERIZATION WITH CORONARY ANGIOGRAM N/A 11/02/2013   Procedure: LEFT HEART CATHETERIZATION WITH CORONARY ANGIOGRAM;  Surgeon: Corky Crafts, MD;  Location: [facility] CATH LAB;  Service: Cardiovascular;  Laterality: N/A;  . LOOP RECORDER IMPLANT N/A 04/13/2014   Procedure: LOOP RECORDER IMPLANT;  Surgeon: Duke Salvia, MD;  Location: [facility] CATH LAB;  Service: Cardiovascular;  Laterality: N/A;  . LOOP RECORDER REMOVAL  ?2016  . POSTERIOR LUMBAR FUSION  05/03/2016   Lumbar Two-Three, Lumbar  Three-Four Posterior Lumbar Fusion withLaminectomy and Foraminotomy  . SPINE SURGERY  04/2016   lumbar   . TEE WITHOUT CARDIOVERSION N/A 08/14/2016   Procedure: TRANSESOPHAGEAL ECHOCARDIOGRAM (TEE);  Surgeon: Lars Masson, MD;  Location: [facility] ENDOSCOPY;  Service: Cardiovascular;  Laterality: N/A;  . TONSILLECTOMY    . TOTAL HIP ARTHROPLASTY Bilateral   . UMBILICAL HERNIA REPAIR  1970s?   Social History   Socioeconomic History  . Marital status: Married    Spouse name: Not on file  . Number of children: Not on file  . Years of education: Not on file  . Highest education level: Not on file  Occupational History  . Not on file  Social Needs  . Financial resource strain: Not on file  . Food insecurity:  Worry: Not on file    Inability: Not on file  . Transportation needs:    Medical: Not on file    Non-medical: Not on file  Tobacco Use  . Smoking status: Never Smoker  . Smokeless tobacco: Never Used  . Tobacco comment: "smoked as a teenager; for maybe 1 wk"  Substance and Sexual Activity  . Alcohol use: Yes    Comment: 6/15//2018 "glass of wine q 6 months or so"  . Drug use: No  . Sexual activity: Not on file  Lifestyle  . Physical activity:    Days per week: Not on file    Minutes per session: Not on file  . Stress: Not on file  Relationships  . Social connections:    Talks on phone: Not on file    Gets together: Not on file    Attends religious service: Not on file    Active member of club or organization: Not on file    Attends meetings of clubs or organizations: Not on file    Relationship status: Not on file  . Intimate partner violence:    Fear of current or ex partner: Not on file    Emotionally abused: Not on file    Physically abused: Not on file    Forced sexual activity: Not on file  Other Topics Concern  . Not on file  Social History Narrative  . Not on file     Review of Systems: General: negative for chills, fever, night sweats or weight  changes.  Cardiovascular: negative for chest pain, dyspnea on exertion, edema, orthopnea, palpitations, paroxysmal nocturnal dyspnea or shortness of breath Dermatological: negative for rash Respiratory: negative for cough or wheezing Urologic: negative for hematuria Abdominal: negative for nausea, vomiting, diarrhea, bright red blood per rectum, melena, or hematemesis Neurologic: negative for visual changes, syncope, or dizziness All other systems reviewed and are otherwise negative except as noted above.   Physical Exam:  Blood pressure 120/70, pulse 71, height 4\' 10"  (1.473 m), weight 164 lb 12.8 oz (74.8 kg), SpO2 98 %.  General appearance: alert, cooperative and no distress Neck: no carotid bruit and no JVD Lungs: clear to auscultation bilaterally Heart: regular rate and rhythm, S1, S2 normal, no murmur, click, rub or gallop Extremities: extremities normal, atraumatic, no cyanosis or edema Pulses: 2+ and symmetric Skin: Skin color, texture, turgor normal. No rashes or lesions Neurologic: Grossly normal  EKG not performed -- personally reviewed   ASSESSMENT AND PLAN:   1. Chronic Systolic HF: pt notes exertional dyspnea and subjective increased abdominal girth. She reports full compliance with Lasix. BP is stable. No visible edema on my exam. She notes chronic cough but lungs are CTAB. Will check a BNP today to see if this is acute CHF exacerbation. Will also check a BMP, for baseline assessment of renal function and electrolytes, in the event that we may need to adjust diuretics.   2. Exertional CP: checking BNP as outlined above to r/o acute CHF exacerbation. She had a normal cath in 2015. This was 4 years ago. Given her other cardiac history we will arrange for NST to r/o coronary ischemia.   3. ICD: history outline above in HPI. Required extraction due to bacteremia/ lead vegetation. Underwent re-implantation 01/2017.  Followed by Dr. Graciela Husbands.    Follow-Up w/ Dr. Delton See in 6  months.   Daylin Gruszka Delmer Islam, MHS [facility] 07/22/2017 5:00 PM

## 2017-07-28 ENCOUNTER — Telehealth (HOSPITAL_COMMUNITY): Payer: Self-pay | Admitting: *Deleted

## 2017-07-28 NOTE — Telephone Encounter (Signed)
Patient given detailed instructions per Myocardial Perfusion Study Information Sheet for the test on 07/31/17 at 1030. Patient notified to arrive 15 minutes early and that it is imperative to arrive on time for appointment to keep from having the test rescheduled.  If you need to cancel or reschedule your appointment, please call the office within 24 hours of your appointment. . Patient verbalized understanding.[name] [name], [name] [name]

## 2017-07-29 ENCOUNTER — Encounter: Payer: Medicare Other | Admitting: Internal Medicine

## 2017-07-31 ENCOUNTER — Ambulatory Visit (HOSPITAL_COMMUNITY): Payer: Medicare Other | Attending: Cardiovascular Disease

## 2017-07-31 DIAGNOSIS — I2510 Atherosclerotic heart disease of native coronary artery without angina pectoris: Secondary | ICD-10-CM | POA: Diagnosis not present

## 2017-07-31 DIAGNOSIS — R079 Chest pain, unspecified: Secondary | ICD-10-CM | POA: Insufficient documentation

## 2017-07-31 DIAGNOSIS — I10 Essential (primary) hypertension: Secondary | ICD-10-CM | POA: Insufficient documentation

## 2017-07-31 DIAGNOSIS — I428 Other cardiomyopathies: Secondary | ICD-10-CM | POA: Diagnosis not present

## 2017-07-31 DIAGNOSIS — Z8249 Family history of ischemic heart disease and other diseases of the circulatory system: Secondary | ICD-10-CM | POA: Insufficient documentation

## 2017-07-31 DIAGNOSIS — R9439 Abnormal result of other cardiovascular function study: Secondary | ICD-10-CM | POA: Insufficient documentation

## 2017-07-31 DIAGNOSIS — R0600 Dyspnea, unspecified: Secondary | ICD-10-CM | POA: Diagnosis not present

## 2017-07-31 DIAGNOSIS — Z95810 Presence of automatic (implantable) cardiac defibrillator: Secondary | ICD-10-CM | POA: Insufficient documentation

## 2017-07-31 DIAGNOSIS — R0609 Other forms of dyspnea: Secondary | ICD-10-CM | POA: Insufficient documentation

## 2017-07-31 LAB — MYOCARDIAL PERFUSION IMAGING
CHL CUP NUCLEAR SRS: 8
CHL CUP NUCLEAR SSS: 15
LV sys vol: 50 mL
LVDIAVOL: 100 mL (ref 46–106)
Peak HR: 98 {beats}/min
RATE: 0.33
Rest HR: 71 {beats}/min
SDS: 7
TID: 1.01

## 2017-07-31 MED ORDER — TECHNETIUM TC 99M TETROFOSMIN IV KIT
10.3000 | PACK | Freq: Once | INTRAVENOUS | Status: AC | PRN
  Administered 2017-07-31: 10.3 via INTRAVENOUS
  Filled 2017-07-31: qty 11

## 2017-07-31 MED ORDER — TECHNETIUM TC 99M TETROFOSMIN IV KIT
31.4000 | PACK | Freq: Once | INTRAVENOUS | Status: AC | PRN
  Administered 2017-07-31: 31.4 via INTRAVENOUS
  Filled 2017-07-31: qty 32

## 2017-07-31 MED ORDER — REGADENOSON 0.4 MG/5ML IV SOLN
0.4000 mg | Freq: Once | INTRAVENOUS | Status: AC
  Administered 2017-07-31: 0.4 mg via INTRAVENOUS

## 2017-08-05 ENCOUNTER — Ambulatory Visit (INDEPENDENT_AMBULATORY_CARE_PROVIDER_SITE_OTHER): Payer: Medicare Other | Admitting: *Deleted

## 2017-08-05 DIAGNOSIS — I428 Other cardiomyopathies: Secondary | ICD-10-CM

## 2017-08-05 DIAGNOSIS — I5022 Chronic systolic (congestive) heart failure: Secondary | ICD-10-CM

## 2017-08-05 NOTE — Progress Notes (Signed)
Remote pacemaker transmission.   

## 2017-08-06 ENCOUNTER — Encounter: Payer: Self-pay | Admitting: Cardiology

## 2017-08-18 LAB — CUP PACEART REMOTE DEVICE CHECK
Battery Remaining Longevity: 118 mo
Brady Statistic AP VS Percent: 0.15 %
Brady Statistic AS VP Percent: 91.38 %
Date Time Interrogation Session: 20190730045107
Implantable Lead Implant Date: 20190111
Implantable Lead Implant Date: 20190111
Implantable Lead Location: 753859
Implantable Lead Location: 753860
Implantable Pulse Generator Implant Date: 20190111
Lead Channel Impedance Value: 1387 Ohm
Lead Channel Impedance Value: 1406 Ohm
Lead Channel Impedance Value: 1425 Ohm
Lead Channel Impedance Value: 399 Ohm
Lead Channel Impedance Value: 551 Ohm
Lead Channel Impedance Value: 741 Ohm
Lead Channel Impedance Value: 779 Ohm
Lead Channel Impedance Value: 798 Ohm
Lead Channel Pacing Threshold Amplitude: 0.625 V
Lead Channel Pacing Threshold Amplitude: 0.75 V
Lead Channel Pacing Threshold Pulse Width: 0.4 ms
Lead Channel Pacing Threshold Pulse Width: 0.4 ms
Lead Channel Pacing Threshold Pulse Width: 1 ms
Lead Channel Sensing Intrinsic Amplitude: 1.625 mV
Lead Channel Setting Pacing Amplitude: 2 V
Lead Channel Setting Pacing Pulse Width: 0.4 ms
Lead Channel Setting Pacing Pulse Width: 1 ms
Lead Channel Setting Sensing Sensitivity: 0.9 mV
MDC IDC LEAD IMPLANT DT: 20190111
MDC IDC LEAD LOCATION: 753858
MDC IDC MSMT BATTERY VOLTAGE: 3.04 V
MDC IDC MSMT LEADCHNL LV IMPEDANCE VALUE: 1368 Ohm
MDC IDC MSMT LEADCHNL LV IMPEDANCE VALUE: 1425 Ohm
MDC IDC MSMT LEADCHNL LV IMPEDANCE VALUE: 1425 Ohm
MDC IDC MSMT LEADCHNL LV IMPEDANCE VALUE: 741 Ohm
MDC IDC MSMT LEADCHNL LV PACING THRESHOLD AMPLITUDE: 2.5 V
MDC IDC MSMT LEADCHNL RA IMPEDANCE VALUE: 399 Ohm
MDC IDC MSMT LEADCHNL RA SENSING INTR AMPL: 1.625 mV
MDC IDC MSMT LEADCHNL RV IMPEDANCE VALUE: 551 Ohm
MDC IDC MSMT LEADCHNL RV SENSING INTR AMPL: 14.5 mV
MDC IDC MSMT LEADCHNL RV SENSING INTR AMPL: 14.5 mV
MDC IDC SET LEADCHNL LV PACING AMPLITUDE: 3 V
MDC IDC SET LEADCHNL RV PACING AMPLITUDE: 2.5 V
MDC IDC STAT BRADY AP VP PERCENT: 6.79 %
MDC IDC STAT BRADY AS VS PERCENT: 1.68 %
MDC IDC STAT BRADY RA PERCENT PACED: 7 %
MDC IDC STAT BRADY RV PERCENT PACED: 8.52 %

## 2017-09-07 ENCOUNTER — Other Ambulatory Visit: Payer: Self-pay | Admitting: Cardiology

## 2017-09-07 DIAGNOSIS — I5022 Chronic systolic (congestive) heart failure: Secondary | ICD-10-CM

## 2017-09-07 DIAGNOSIS — Z95810 Presence of automatic (implantable) cardiac defibrillator: Secondary | ICD-10-CM

## 2017-09-10 ENCOUNTER — Other Ambulatory Visit: Payer: Self-pay

## 2017-09-10 DIAGNOSIS — Z95810 Presence of automatic (implantable) cardiac defibrillator: Secondary | ICD-10-CM

## 2017-09-10 DIAGNOSIS — I5022 Chronic systolic (congestive) heart failure: Secondary | ICD-10-CM

## 2017-09-10 MED ORDER — SACUBITRIL-VALSARTAN 24-26 MG PO TABS: 1.0000 | ORAL_TABLET | Freq: Two times a day (BID) | ORAL | 2 refills | Status: DC

## 2017-10-13 ENCOUNTER — Other Ambulatory Visit: Payer: Self-pay | Admitting: Infectious Disease

## 2017-10-13 DIAGNOSIS — T827XXA Infection and inflammatory reaction due to other cardiac and vascular devices, implants and grafts, initial encounter: Secondary | ICD-10-CM

## 2017-10-13 DIAGNOSIS — T8450XS Infection and inflammatory reaction due to unspecified internal joint prosthesis, sequela: Secondary | ICD-10-CM

## 2017-10-18 ENCOUNTER — Other Ambulatory Visit: Payer: Self-pay | Admitting: Cardiology

## 2017-10-18 DIAGNOSIS — J209 Acute bronchitis, unspecified: Secondary | ICD-10-CM

## 2017-10-18 DIAGNOSIS — Z79899 Other long term (current) drug therapy: Secondary | ICD-10-CM

## 2017-10-18 DIAGNOSIS — I428 Other cardiomyopathies: Secondary | ICD-10-CM

## 2017-10-18 DIAGNOSIS — I10 Essential (primary) hypertension: Secondary | ICD-10-CM

## 2017-10-18 DIAGNOSIS — Z95810 Presence of automatic (implantable) cardiac defibrillator: Secondary | ICD-10-CM

## 2017-10-18 DIAGNOSIS — I5022 Chronic systolic (congestive) heart failure: Secondary | ICD-10-CM

## 2017-11-04 ENCOUNTER — Ambulatory Visit (INDEPENDENT_AMBULATORY_CARE_PROVIDER_SITE_OTHER): Payer: Medicare Other | Admitting: *Deleted

## 2017-11-04 DIAGNOSIS — I5022 Chronic systolic (congestive) heart failure: Secondary | ICD-10-CM

## 2017-11-04 DIAGNOSIS — I428 Other cardiomyopathies: Secondary | ICD-10-CM

## 2017-11-04 NOTE — Progress Notes (Signed)
Remote pacemaker transmission.   

## 2017-11-13 ENCOUNTER — Other Ambulatory Visit: Payer: Self-pay | Admitting: Cardiology

## 2017-11-13 NOTE — Telephone Encounter (Signed)
Outpatient Medication Detail    Disp Refills Start End   sacubitril-valsartan (ENTRESTO) 24-26 MG 180 tablet 2 09/10/2017    Sig - Route: Take 1 tablet by mouth 2 (two) times daily. - Oral   Sent to pharmacy as: sacubitril-valsartan (ENTRESTO) 24-26 MG   E-Prescribing Status: Receipt confirmed by pharmacy (09/10/2017 1:36 PM EDT)   Associated Diagnoses   Chronic systolic CHF (congestive heart failure), NYHA class 3 (HCC)     Biventricular ICD (implantable cardioverter-defibrillator) in place     Pharmacy   [id number] - [place], [place] - [address]

## 2017-12-09 ENCOUNTER — Ambulatory Visit: Payer: Medicare Other | Admitting: Infectious Disease

## 2017-12-23 ENCOUNTER — Encounter (INDEPENDENT_AMBULATORY_CARE_PROVIDER_SITE_OTHER): Payer: Self-pay

## 2017-12-23 ENCOUNTER — Ambulatory Visit (INDEPENDENT_AMBULATORY_CARE_PROVIDER_SITE_OTHER): Payer: Medicare Other | Admitting: Cardiology

## 2017-12-23 ENCOUNTER — Encounter: Payer: Self-pay | Admitting: Cardiology

## 2017-12-23 VITALS — BP 112/70 | HR 66 | Ht <= 58 in | Wt 164.8 lb

## 2017-12-23 DIAGNOSIS — I5022 Chronic systolic (congestive) heart failure: Secondary | ICD-10-CM | POA: Diagnosis not present

## 2017-12-23 DIAGNOSIS — Z950 Presence of cardiac pacemaker: Secondary | ICD-10-CM | POA: Diagnosis not present

## 2017-12-23 DIAGNOSIS — I428 Other cardiomyopathies: Secondary | ICD-10-CM

## 2017-12-23 DIAGNOSIS — Z95810 Presence of automatic (implantable) cardiac defibrillator: Secondary | ICD-10-CM

## 2017-12-23 DIAGNOSIS — R0602 Shortness of breath: Secondary | ICD-10-CM | POA: Diagnosis not present

## 2017-12-23 LAB — CBC WITH DIFFERENTIAL/PLATELET
Basophils Absolute: 0 10*3/uL (ref 0.0–0.2)
Basos: 1 %
EOS (ABSOLUTE): 0.6 10*3/uL — ABNORMAL HIGH (ref 0.0–0.4)
Eos: 12 %
Hematocrit: 34 % (ref 34.0–46.6)
Hemoglobin: 12.3 g/dL (ref 11.1–15.9)
Immature Grans (Abs): 0 10*3/uL (ref 0.0–0.1)
Immature Granulocytes: 0 %
Lymphocytes Absolute: 1.1 10*3/uL (ref 0.7–3.1)
Lymphs: 20 %
MCH: 33.9 pg — ABNORMAL HIGH (ref 26.6–33.0)
MCHC: 36.2 g/dL — ABNORMAL HIGH (ref 31.5–35.7)
MCV: 94 fL (ref 79–97)
Monocytes Absolute: 0.4 10*3/uL (ref 0.1–0.9)
Monocytes: 7 %
Neutrophils Absolute: 3.1 10*3/uL (ref 1.4–7.0)
Neutrophils: 60 %
Platelets: 163 10*3/uL (ref 150–450)
RBC: 3.63 x10E6/uL — ABNORMAL LOW (ref 3.77–5.28)
RDW: 12 % — ABNORMAL LOW (ref 12.3–15.4)
WBC: 5.2 10*3/uL (ref 3.4–10.8)

## 2017-12-23 LAB — COMPREHENSIVE METABOLIC PANEL
ALT: 13 IU/L (ref 0–32)
AST: 21 IU/L (ref 0–40)
Albumin/Globulin Ratio: 2 (ref 1.2–2.2)
Albumin: 4.5 g/dL (ref 3.5–4.7)
Alkaline Phosphatase: 50 IU/L (ref 39–117)
BUN/Creatinine Ratio: 24 (ref 12–28)
BUN: 27 mg/dL (ref 8–27)
Bilirubin Total: 0.5 mg/dL (ref 0.0–1.2)
CO2: 22 mmol/L (ref 20–29)
Calcium: 9.6 mg/dL (ref 8.7–10.3)
Chloride: 104 mmol/L (ref 96–106)
Creatinine, Ser: 1.14 mg/dL — ABNORMAL HIGH (ref 0.57–1.00)
GFR calc Af Amer: 52 mL/min/{1.73_m2} — ABNORMAL LOW (ref >59)
GFR calc non Af Amer: 46 mL/min/{1.73_m2} — ABNORMAL LOW (ref >59)
Globulin, Total: 2.3 g/dL (ref 1.5–4.5)
Glucose: 90 mg/dL (ref 65–99)
Potassium: 4.8 mmol/L (ref 3.5–5.2)
Sodium: 140 mmol/L (ref 134–144)
Total Protein: 6.8 g/dL (ref 6.0–8.5)

## 2017-12-23 LAB — TSH: TSH: 44.61 u[IU]/mL — ABNORMAL HIGH (ref 0.450–4.500)

## 2017-12-23 LAB — PRO B NATRIURETIC PEPTIDE: NT-Pro BNP: 130 pg/mL (ref 0–738)

## 2017-12-23 MED ORDER — FLUTICASONE-SALMETEROL 250-50 MCG/DOSE IN AEPB
1.0000 | INHALATION_SPRAY | Freq: Two times a day (BID) | RESPIRATORY_TRACT | 3 refills | Status: DC
Start: 2017-12-23 — End: 2019-04-26

## 2017-12-23 NOTE — Progress Notes (Signed)
12/23/2017 [name] [name]   08/18/1935  [id number]  Primary Physician [name], MD Primary Cardiologist: Dr. Delton See Electrophysiologist: Dr. Graciela Husbands   Reason for Visit/CC: 6 month f/u for NICM  HPI:  [name] [name] is a 82 y.o. female who is being seen today for routine 6 month evaluation. She is followed by Dr. Delton See as well as Dr. Graciela Husbands. She has a h/o nonischemic cardiomyopathy, left bundle branch block, nonsustained ventricular tachycardia, HTN and pancreatitis.   She underwent CRT Biotronik implantation  06/2014.  06/2016, she was  hospitalized and found to have staph bacteremia of (MSSA) in the wake of orthopedic surgery.  TEE at [facility] was reportedly negative. She was treated with  6 weeks of antibiotics  She developed recurrent 'septic arthritis" and bacteremia and TEE confirmed Vegetation on RA lead. She was transferred to [facility] for extraction which she tolerated well.   Rx with 8 weeks of IV Abx, most recently 10/18 keflex and rifampin.  Because of worsening left ventricular function and worsening functional status, she underwent CRT reimplantation 01/2017, aware that she would need long-term chronic suppressive antibiotics. Per. Dr. Odessa Fleming recent OV note, she has been considerably better following CRT implantation.  There is less shortness of breath and more energy.  Repeat echo has not yet been done.  12/23/2017, the patient is coming after 6 months, she states that she has been feeling more tired and short of breath with minimal activity, she has no lower extremity edema, she has a rescue inhaler albuterol but does not use any maintenance inhaler.  She has been compliant with her medications and has no side effects.  She sleeps well and has no orthopnea or proximal nocturnal dyspnea.   Cardiac Studies 08/14/16: TEE Study Conclusions - Left ventricle: Systolic function was normal. The estimated ejection fraction was in the range of 55% to 60%. Wall motion  was normal; there were no regional wall motion abnormalities. - Aortic valve: No evidence of vegetation. There was mild regurgitation. - Aorta: There was mild non-mobile atheroma. - Mitral valve: There was mild regurgitation. - Left atrium: No evidence of thrombus in the atrial cavity or appendage. No evidence of thrombus in the atrial cavity or appendage. - Right atrium: No evidence of thrombus in the atrial cavity or appendage. No evidence of thrombus in the atrial cavity or appendage. - Tricuspid valve: No evidence of vegetation. There was mild regurgitation. - Pulmonic valve: No evidence of vegetation. Impressions: - There is a highly mobile echodensity measuring 13 x 3 mm attached to the right atrial lead in the right atrium at the tricuspid valve level. This is consistent with pacemaker endocarditis.   10/17/15: limited echo for AV optimization Impressions: - The study is performed for AV optimization There is grade 1 diastolic dysfunction The optimal AV delay seems to be around 90-100 ms but i would defer to the doctors managing the pacer.  02/01/15: TTE Study Conclusions - Left ventricle: The cavity size was normal. There was moderate concentric hypertrophy. Systolic function was normal. The estimated ejection fraction was in the range of 50% to 55%. Wall motion was normal; there were no regional wall motion abnormalities. Doppler parameters are consistent with abnormal left ventricular relaxation (grade 1 diastolic dysfunction). The E/e&' ratio is >15, suggesting elevated LV filling pressure. - Mitral valve: Calcified annulus. Mildly thickened leaflets . There was trivial regurgitation. - Left atrium: The atrium was normal in size. - Right ventricle: The cavity size was normal. Wall thickness  was normal. Pacer wire or catheter noted in right ventricle. Systolic function was normal. - Right atrium: The atrium was normal in  size. Pacer wire or catheter noted in right atrium. - Tricuspid valve: There was moderate regurgitation. - Pulmonary arteries: PA peak pressure: 30 mm Hg (S). - Inferior vena cava: The vessel was normal in size. The respirophasic diameter changes were in the normal range (= 50%), consistent with normal central venous pressure. Impressions: - Compared to a prior study in 09/2014, the EF has improved to 50-55%.    Current Meds  Medication Sig  . albuterol (PROAIR HFA) 108 (90 Base) MCG/ACT inhaler Inhale 2 puffs into the lungs every 6 (six) hours as needed for wheezing or shortness of breath.  [name] alum & mag hydroxide-simeth (MAALOX/MYLANTA) 200-200-20 MG/5ML suspension Take 30 mLs by mouth every 4 (four) hours as needed for indigestion.  . betamethasone dipropionate (DIPROLENE) 0.05 % ointment Apply 1 application topically daily as needed.  . Calcium Carbonate Antacid (TUMS PO) Take 1 tablet by mouth daily as needed (indigestion).   . carvedilol (COREG) 12.5 MG tablet TAKE 1 TABLET TWICE A DAY  . cephALEXin (KEFLEX) 500 MG capsule Take 2 capsules (1,000 mg total) by mouth 2 (two) times daily.  . feeding supplement, ENSURE ENLIVE, (ENSURE ENLIVE) LIQD Take 237 mLs by mouth 2 (two) times daily between meals.  . ferrous sulfate 325 (65 FE) MG tablet Take 1 tablet (325 mg total) by mouth daily with breakfast.  . furosemide (LASIX) 20 MG tablet Take 1 tablet (20 mg total) by mouth daily as needed for edema.  [name] levothyroxine (SYNTHROID, LEVOTHROID) 112 MCG tablet Take 112 mcg by mouth daily before breakfast.  . Magnesium 200 MG TABS Take 200 mg by mouth daily.  . Melatonin 3 MG TABS Take 3 mg by mouth at bedtime as needed (for sleep.).   [name] menthol-cetylpyridinium (CEPACOL) 3 MG lozenge Take 1 lozenge (3 mg total) by mouth as needed for sore throat (sore throat).  . methocarbamol (ROBAXIN) 500 MG tablet Take 1 tablet (500 mg total) by mouth every 6 (six) hours as needed for muscle spasms.    . methylphenidate (RITALIN) 20 MG tablet Take 20 mg by mouth daily.   March Rummage (ALLERGY EYE OP) Place 1-2 drops into both eyes daily as needed (for irritated eyes).  . ondansetron (ZOFRAN) 4 MG tablet Take 1 tablet (4 mg total) by mouth every 6 (six) hours as needed for nausea.  . pantoprazole (PROTONIX) 40 MG tablet Take 1 tablet (40 mg total) by mouth 2 (two) times daily before a meal.  . polyethylene glycol (MIRALAX / GLYCOLAX) packet Take 17 g by mouth daily as needed for mild constipation.  . rifampin (RIFADIN) 300 MG capsule TAKE 1 CAPSULE(300 MG) BY MOUTH EVERY 12 HOURS  . sacubitril-valsartan (ENTRESTO) 24-26 MG Take 1 tablet by mouth 2 (two) times daily.  . sucralfate (CARAFATE) 1 g tablet Take 1 tablet (1 g total) by mouth 3 (three) times daily with meals as needed (for worsening epigastric pain/indigestion after eating).   Allergies  Allergen Reactions  . Sulfa Antibiotics Hives   Past Medical History:  Diagnosis Date  . 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 Acute lower GI bleeding 05/06/2017  . Acute lower GI bleeding 05/06/2017  . Anemia 05/06/2017  . Arthritis    "finger joints" (06/21/2016)  . Asthma   .  CHF (congestive heart failure) (HCC)   . Congestive dilated cardiomyopathy (HCC) 03/31/2014   a. s/p Biotronik CRTD 06/2014  . GERD (gastroesophageal reflux disease)   . Headache   . History of blood transfusion 1990s   "related to hysterectomy"  . Hypothyroidism   . Left bundle branch block   . MSSA bacteremia 06/21/2016  . Myocardial infarction (HCC)   . Pancreatitis, acute    Family History  Problem Relation Age of Onset  . Hyperlipidemia Mother   . Arrhythmia Mother        palpitations  . Heart disease Father   . Heart attack Father   . Heart attack Maternal Grandfather   . Heart attack Paternal Grandfather    Past Surgical History:  Procedure  Laterality Date  . ABDOMINAL HYSTERECTOMY    . APPENDECTOMY    . BIV PACEMAKER INSERTION CRT-P N/A 01/17/2017   Procedure: BIV PACEMAKER INSERTION CRT-P;  Surgeon: Duke Salvia, MD;  Location: [facility] INVASIVE CV LAB;  Service: Cardiovascular;  Laterality: N/A;  . CARDIAC CATHETERIZATION    . CARDIAC DEFIBRILLATOR PLACEMENT  ?2016  . CATARACT EXTRACTION W/ INTRAOCULAR LENS  IMPLANT, BILATERAL Bilateral   . CHOLECYSTECTOMY OPEN     "related to pancreatitis"  . DILATION AND CURETTAGE OF UTERUS    . EP IMPLANTABLE DEVICE N/A 07/04/2014   Procedure: BiV ICD Insertion CRT-D;  Surgeon: Duke Salvia, MD;  Location: [facility] INVASIVE CV LAB;  Service: Cardiovascular;  Laterality: N/A;  . ESOPHAGOGASTRODUODENOSCOPY (EGD) WITH PROPOFOL N/A 05/08/2017   Procedure: ESOPHAGOGASTRODUODENOSCOPY (EGD) WITH PROPOFOL;  Surgeon: Graylin Shiver, MD;  Location: [facility] ENDOSCOPY;  Service: Endoscopy;  Laterality: N/A;  . HERNIA REPAIR    . I&D KNEE WITH POLY EXCHANGE Right 08/11/2016   Procedure: IRRIGATION AND DEBRIDEMENT KNEE WITH POLY EXCHANGE;  Surgeon: Samson Frederic, MD;  Location: [facility] OR;  Service: Orthopedics;  Laterality: Right;  . INSERT / REPLACE / REMOVE PACEMAKER  ?2016  . JOINT REPLACEMENT Bilateral    "thumb joints; used my own material"  . JOINT REPLACEMENT    . LEFT HEART CATHETERIZATION WITH CORONARY ANGIOGRAM N/A 11/02/2013   Procedure: LEFT HEART CATHETERIZATION WITH CORONARY ANGIOGRAM;  Surgeon: Corky Crafts, MD;  Location: [facility] CATH LAB;  Service: Cardiovascular;  Laterality: N/A;  . LOOP RECORDER IMPLANT N/A 04/13/2014   Procedure: LOOP RECORDER IMPLANT;  Surgeon: Duke Salvia, MD;  Location: [facility] CATH LAB;  Service: Cardiovascular;  Laterality: N/A;  . LOOP RECORDER REMOVAL  ?2016  . POSTERIOR LUMBAR FUSION  05/03/2016   Lumbar Two-Three, Lumbar Three-Four Posterior Lumbar Fusion withLaminectomy and Foraminotomy  . SPINE SURGERY  04/2016   lumbar   . TEE WITHOUT CARDIOVERSION N/A 08/14/2016    Procedure: TRANSESOPHAGEAL ECHOCARDIOGRAM (TEE);  Surgeon: Lars Masson, MD;  Location: [facility] ENDOSCOPY;  Service: Cardiovascular;  Laterality: N/A;  . TONSILLECTOMY    . TOTAL HIP ARTHROPLASTY Bilateral   . UMBILICAL HERNIA REPAIR  1970s?   Social History   Socioeconomic History  . Marital status: Married    Spouse name: Not on file  . Number of children: Not on file  . Years of education: Not on file  . Highest education level: Not on file  Occupational History  . Not on file  Social Needs  . Financial resource strain: Not on file  . Food insecurity:    Worry: Not on file    Inability: Not on file  . Transportation needs:    Medical: Not on file  Non-medical: Not on file  Tobacco Use  . Smoking status: Never Smoker  . Smokeless tobacco: Never Used  . Tobacco comment: "smoked as a teenager; for maybe 1 wk"  Substance and Sexual Activity  . Alcohol use: Yes    Comment: 6/15//2018 "glass of wine q 6 months or so"  . Drug use: No  . Sexual activity: Not on file  Lifestyle  . Physical activity:    Days per week: Not on file    Minutes per session: Not on file  . Stress: Not on file  Relationships  . Social connections:    Talks on phone: Not on file    Gets together: Not on file    Attends religious service: Not on file    Active member of club or organization: Not on file    Attends meetings of clubs or organizations: Not on file    Relationship status: Not on file  . Intimate partner violence:    Fear of current or ex partner: Not on file    Emotionally abused: Not on file    Physically abused: Not on file    Forced sexual activity: Not on file  Other Topics Concern  . Not on file  Social History Narrative  . Not on file    Review of Systems: General: negative for chills, fever, night sweats or weight changes.  Cardiovascular: negative for chest pain, dyspnea on exertion, edema, orthopnea, palpitations, paroxysmal nocturnal dyspnea or shortness of  breath Dermatological: negative for rash Respiratory: negative for cough or wheezing Urologic: negative for hematuria Abdominal: negative for nausea, vomiting, diarrhea, bright red blood per rectum, melena, or hematemesis Neurologic: negative for visual changes, syncope, or dizziness All other systems reviewed and are otherwise negative except as noted above.  Physical Exam:  Blood pressure 112/70, pulse 66, height 4\' 10"  (1.473 m), weight 164 lb 12.8 oz (74.8 kg), SpO2 98 %.  General appearance: alert, cooperative and no distress Neck: no carotid bruit and no JVD Lungs: clear to auscultation bilaterally Heart: regular rate and rhythm, S1, S2 normal, no murmur, click, rub or gallop Extremities: extremities normal, atraumatic, no cyanosis or edema Pulses: 2+ and symmetric Skin: Skin color, texture, turgor normal. No rashes or lesions Neurologic: Grossly normal  EKG performed today, shows atrial sensed ventricular paced rhythm, QRS duration 128 ms, unchanged from prior -- personally reviewed    ASSESSMENT AND PLAN:   1.  Acute on chronic chronic systolic HF: pt notes exertional dyspnea, we will obtain BMP and BNP today  2.  Nonischemic cardiomyopathy -LVEF 30 to 35%, initially improvement to 50 to 55% with a BiV ICD, however her device get infected had to be removed she received another device in January 2019.  We will repeat an echocardiogram.  3.  Dyspnea on exertion -secondary to the above that she is also wheezing today, I will start Advair 250/50 twice daily as a maintenance inhaler.  We will also obtain BMP and BNP to help [place] distinguish if this is mostly asthma or her exacerbation of her CHF, on physical exam she appears euvolemic.  4. BiV/ICD: history outline above in HPI. Required extraction due to bacteremia/ lead vegetation. Underwent re-implantation 01/2017.  Followed by Dr. Graciela Husbands.   Functionally she seemed to be doing quite well following CRT implantation.  QRS is  considerably narrowed.  She needs repeat echocardiogram to reassess LV function.  Follow-Up w/ Dr. Delton See in 3 months.   Tobias Alexander , MD [facility] 12/23/2017 10:30  AM

## 2017-12-23 NOTE — Patient Instructions (Addendum)
Medication Instructions:   START USING ADVAIR 250/50 MCG/DOSE--INHALE 1 PUFF INTO THE LUNGS TWICE DAILY   If you need a refill on your cardiac medications before your next appointment, please call your pharmacy.     Lab work:  TODAY--CMET, CBC W DIFF, TSH, AND PRO-BNP  If you have labs (blood work) drawn today and your tests are completely normal, you will receive your results only by: [name] MyChart Message (if you have MyChart) OR . A paper copy in the mail If you have any lab test that is abnormal or we need to change your treatment, we will call you to review the results.   Testing:  Your physician has requested that you have an echocardiogram. Echocardiography is a painless test that uses sound waves to create images of your heart. It provides your doctor with information about the size and shape of your heart and how well your heart's chambers and valves are working. This procedure takes approximately one hour. There are no restrictions for this procedure.    Follow-Up:  IN 3 MONTHS WITH DR NELSON--PER DR Delton See ADD THIS PATIENT IN ON HER SCHEDULE  03/23/18 AT HER 2:00 PM OR 3:00 PM SLOT--PUT OK PER Nikitas Davtyan AND DR Delton See

## 2017-12-24 ENCOUNTER — Telehealth: Payer: Self-pay

## 2017-12-24 DIAGNOSIS — E039 Hypothyroidism, unspecified: Secondary | ICD-10-CM

## 2017-12-24 DIAGNOSIS — Z79899 Other long term (current) drug therapy: Secondary | ICD-10-CM

## 2017-12-24 MED ORDER — LEVOTHYROXINE SODIUM 150 MCG PO TABS: 150.0000 ug | ORAL_TABLET | Freq: Every day | ORAL | 3 refills | Status: DC

## 2017-12-24 NOTE — Telephone Encounter (Signed)
-----   Message from Lars Masson, MD sent at 12/24/2017  2:49 PM EST ----- Can you please increase her dose of synthroid to 150 mcg and repeat TSH, fT3 and fT4 in 4 weeks?

## 2017-12-24 NOTE — Telephone Encounter (Signed)
Notes recorded by Sigurd Sos, RN on 12/24/2017 at 3:01 PM EST The patient has been notified of the result and verbalized understanding. Increased Synthroid to 150 mcg daily and scheduled labs to be drawn 1/20 TSH; FT3; FT4; All questions (if any) were answered. Sigurd Sos, RN 12/24/2017 3:00 PM

## 2017-12-26 ENCOUNTER — Other Ambulatory Visit: Payer: Self-pay | Admitting: Cardiology

## 2017-12-26 DIAGNOSIS — Z95810 Presence of automatic (implantable) cardiac defibrillator: Secondary | ICD-10-CM

## 2017-12-26 DIAGNOSIS — I5022 Chronic systolic (congestive) heart failure: Secondary | ICD-10-CM

## 2018-01-04 LAB — CUP PACEART REMOTE DEVICE CHECK
Battery Voltage: 3.02 V
Brady Statistic AP VP Percent: 16.1 %
Brady Statistic AP VS Percent: 0.24 %
Brady Statistic AS VP Percent: 82.18 %
Brady Statistic AS VS Percent: 1.47 %
Brady Statistic RA Percent Paced: 16.42 %
Brady Statistic RV Percent Paced: 19.37 %
Date Time Interrogation Session: 20191029044950
Implantable Lead Implant Date: 20190111
Implantable Lead Implant Date: 20190111
Implantable Lead Implant Date: 20190111
Implantable Lead Location: 753858
Implantable Lead Location: 753859
Implantable Lead Model: 5076
Implantable Lead Model: 5076
Lead Channel Impedance Value: 1311 Ohm
Lead Channel Impedance Value: 1368 Ohm
Lead Channel Impedance Value: 1387 Ohm
Lead Channel Impedance Value: 1444 Ohm
Lead Channel Impedance Value: 1444 Ohm
Lead Channel Impedance Value: 1482 Ohm
Lead Channel Impedance Value: 380 Ohm
Lead Channel Impedance Value: 380 Ohm
Lead Channel Impedance Value: 456 Ohm
Lead Channel Impedance Value: 684 Ohm
Lead Channel Impedance Value: 722 Ohm
Lead Channel Pacing Threshold Amplitude: 0.5 V
Lead Channel Pacing Threshold Amplitude: 0.875 V
Lead Channel Pacing Threshold Amplitude: 3.75 V
Lead Channel Pacing Threshold Pulse Width: 0.4 ms
Lead Channel Pacing Threshold Pulse Width: 0.4 ms
Lead Channel Sensing Intrinsic Amplitude: 1.5 mV
Lead Channel Sensing Intrinsic Amplitude: 1.5 mV
Lead Channel Sensing Intrinsic Amplitude: 15 mV
Lead Channel Sensing Intrinsic Amplitude: 15 mV
Lead Channel Setting Pacing Amplitude: 2.5 V
Lead Channel Setting Pacing Amplitude: 3 V
Lead Channel Setting Pacing Pulse Width: 1 ms
Lead Channel Setting Sensing Sensitivity: 0.9 mV
MDC IDC LEAD LOCATION: 753860
MDC IDC MSMT BATTERY REMAINING LONGEVITY: 114 mo
MDC IDC MSMT LEADCHNL LV IMPEDANCE VALUE: 817 Ohm
MDC IDC MSMT LEADCHNL LV IMPEDANCE VALUE: 817 Ohm
MDC IDC MSMT LEADCHNL LV PACING THRESHOLD PULSEWIDTH: 1 ms
MDC IDC MSMT LEADCHNL RV IMPEDANCE VALUE: 513 Ohm
MDC IDC PG IMPLANT DT: 20190111
MDC IDC SET LEADCHNL RA PACING AMPLITUDE: 2 V
MDC IDC SET LEADCHNL RV PACING PULSEWIDTH: 0.4 ms

## 2018-01-09 ENCOUNTER — Encounter: Payer: Self-pay | Admitting: Infectious Disease

## 2018-01-09 ENCOUNTER — Ambulatory Visit (INDEPENDENT_AMBULATORY_CARE_PROVIDER_SITE_OTHER): Payer: Medicare Other | Admitting: Infectious Disease

## 2018-01-09 VITALS — BP 130/72 | HR 76 | Temp 97.6°F | Wt 164.0 lb

## 2018-01-09 DIAGNOSIS — T8450XS Infection and inflammatory reaction due to unspecified internal joint prosthesis, sequela: Secondary | ICD-10-CM

## 2018-01-09 DIAGNOSIS — T827XXD Infection and inflammatory reaction due to other cardiac and vascular devices, implants and grafts, subsequent encounter: Secondary | ICD-10-CM

## 2018-01-09 DIAGNOSIS — Z95810 Presence of automatic (implantable) cardiac defibrillator: Secondary | ICD-10-CM

## 2018-01-09 DIAGNOSIS — B9561 Methicillin susceptible Staphylococcus aureus infection as the cause of diseases classified elsewhere: Secondary | ICD-10-CM | POA: Diagnosis not present

## 2018-01-09 DIAGNOSIS — T827XXA Infection and inflammatory reaction due to other cardiac and vascular devices, implants and grafts, initial encounter: Secondary | ICD-10-CM

## 2018-01-09 DIAGNOSIS — T8453XD Infection and inflammatory reaction due to internal right knee prosthesis, subsequent encounter: Secondary | ICD-10-CM | POA: Diagnosis not present

## 2018-01-09 DIAGNOSIS — I5022 Chronic systolic (congestive) heart failure: Secondary | ICD-10-CM

## 2018-01-09 DIAGNOSIS — I447 Left bundle-branch block, unspecified: Secondary | ICD-10-CM

## 2018-01-09 NOTE — Progress Notes (Signed)
Subjective:   Follow-up for her MSSA device and prosthetic joint infections   Patient ID: [name] [name], female    DOB: 1935/07/13, 83 y.o.   MRN: [id number]  HPI  83 year old who underwent lumbar laminectomy by Dr. Yetta Barre on 05/03/16. Presented to [facility] 6/7 with fevers, weakness and diffuse pain. She developed sepsis/hypotension and was found to have MSSA bacteremia and transferred to [facility] 6/15 for continued care and assessment by EP team considering she has cardiac device. At [facility] she underwent evaluation for metastatic sites of infection including CT of spine, TTE/TEE and BCx monitoring. All repeat BCx were negative once transferred here. TTE/TEE were both negative for lead or valvular endocarditis at that time; received 4 weeks IV Ancef and decision to leave cardiac device.  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 She underwent single staged exchange arthroplasty on right knee by Dr. Veda Canning. Dr. Ladona Ridgel was not available for device extraction here so she was transferred to [facility] where she had cardiac device extracted on August 10th. She had blood cultures and device cultures done with no growth. PICC inserted on 08/18/16. She was sent out on IV cefazolin 2g IV q 8 but also IV rifampin 300 mg IV q 12 at SNF and then home. Managing FIVE infusions was overwhelming for her husband. I changed her to rifampin 300mg  orally BID and then Ceftriaxone 2gram IV push to complete 42 days of therapy post extraction. Initially her cardiac function had mproved quite nicely. We placed her on high dose keflex and bid rifampin and she has been on these meds without fail since I last saw her. She is approaching in December 4 months of systemic antimicrobials since extraction of her device and a litle more since I and D and exchange of  liner of prosthesis.   Her EF had worsened and EP and Cardiology after careful consideration we implanted a biventricular pacemaker on January 17, 2017.  She continued on her cephalexin and rifampin.   She is doing well and claims she has no pain in her right knee whatsoever no tenderness there when she stands.  She does have pain in her right shoulder and right hip at times but has had this for a month and she believes that is due to osteoarthritis.     Past Medical History:  Diagnosis Date  . 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 Acute lower GI bleeding 05/06/2017  . Acute lower GI bleeding 05/06/2017  . Anemia 05/06/2017  . Arthritis    "finger joints" (06/21/2016)  . Asthma   . CHF (congestive heart failure) (HCC)   . Congestive dilated cardiomyopathy (HCC) 03/31/2014   a. s/p Biotronik CRTD 06/2014  . GERD (gastroesophageal reflux disease)   . Headache   . History of blood transfusion 1990s   "related to hysterectomy"  . Hypothyroidism   . Left bundle branch block   . MSSA bacteremia 06/21/2016  . Myocardial infarction (HCC)   . Pancreatitis, acute     Past Surgical History:  Procedure Laterality Date  . ABDOMINAL HYSTERECTOMY    . APPENDECTOMY    . BIV PACEMAKER INSERTION CRT-P N/A 01/17/2017   Procedure: BIV PACEMAKER INSERTION CRT-P;  Surgeon: Duke Salvia, MD;  Location: [facility] INVASIVE CV LAB;  Service: Cardiovascular;  Laterality: N/A;  . CARDIAC CATHETERIZATION    . CARDIAC DEFIBRILLATOR PLACEMENT  ?2016  . CATARACT EXTRACTION W/ INTRAOCULAR LENS  IMPLANT, BILATERAL Bilateral   . CHOLECYSTECTOMY OPEN     "related to pancreatitis"  . DILATION AND CURETTAGE OF UTERUS    . EP IMPLANTABLE DEVICE N/A 07/04/2014   Procedure: BiV ICD Insertion CRT-D;  Surgeon: Duke SalviaSteven C Klein, MD;  [facility];  Service: Cardiovascular;  Laterality: N/A;  . ESOPHAGOGASTRODUODENOSCOPY  (EGD) WITH PROPOFOL N/A 05/08/2017   Procedure: ESOPHAGOGASTRODUODENOSCOPY (EGD) WITH PROPOFOL;  Surgeon: Graylin ShiverGanem, Salem F, MD;  [facility];  Service: Endoscopy;  Laterality: N/A;  . HERNIA REPAIR    . I&D KNEE WITH POLY EXCHANGE Right 08/11/2016   Procedure: IRRIGATION AND DEBRIDEMENT KNEE WITH POLY EXCHANGE;  Surgeon: Samson FredericSwinteck, Brian, MD;  Location: [facility] OR;  Service: Orthopedics;  Laterality: Right;  . INSERT / REPLACE / REMOVE PACEMAKER  ?2016  . JOINT REPLACEMENT Bilateral    "thumb joints; used my own material"  . JOINT REPLACEMENT    . LEFT HEART CATHETERIZATION WITH CORONARY ANGIOGRAM N/A 11/02/2013   Procedure: LEFT HEART CATHETERIZATION WITH CORONARY ANGIOGRAM;  Surgeon: Corky CraftsJayadeep S Varanasi, MD;  [facility];  Service: Cardiovascular;  Laterality: N/A;  . LOOP RECORDER IMPLANT N/A 04/13/2014   Procedure: LOOP RECORDER IMPLANT;  Surgeon: Duke SalviaSteven C Klein, MD;  [facility];  Service: Cardiovascular;  Laterality: N/A;  . LOOP RECORDER REMOVAL  ?2016  . POSTERIOR LUMBAR FUSION  05/03/2016   Lumbar Two-Three, Lumbar Three-Four Posterior Lumbar Fusion withLaminectomy and Foraminotomy  . SPINE SURGERY  04/2016   lumbar   . TEE WITHOUT CARDIOVERSION N/A 08/14/2016   Procedure: TRANSESOPHAGEAL ECHOCARDIOGRAM (TEE);  Surgeon: Lars MassonNelson, Katarina H, MD;  [facility];  Service: Cardiovascular;  Laterality: N/A;  . TONSILLECTOMY    . TOTAL HIP ARTHROPLASTY Bilateral   . UMBILICAL HERNIA REPAIR  1970s?    Family History  Problem Relation Age of Onset  . Hyperlipidemia Mother   . Arrhythmia Mother        palpitations  . Heart disease Father   . Heart attack Father   . Heart attack Maternal Grandfather   . Heart attack Paternal Grandfather       Social History   Socioeconomic History  . Marital status: Married    Spouse name: Not on file  . Number of children: Not on file  . Years of education: Not on file  . Highest education level: Not on file    Occupational History  . Not on file  Social Needs  . Financial resource strain: Not on file  . Food insecurity:    Worry: Not on file    Inability: Not on file  . Transportation needs:    Medical: Not on file    Non-medical: Not on file  Tobacco Use  . Smoking status: Never Smoker  . Smokeless tobacco: Never Used  . Tobacco comment: "smoked as a teenager; for maybe 1 wk"  Substance and Sexual Activity  . Alcohol use: Yes    Comment: 6/15//2018 "glass of wine q 6 months or so"  . Drug use: No  . Sexual activity: Not on file  Lifestyle  . Physical activity:    Days per week: Not on file    Minutes per session: Not on file  . Stress: Not on file  Relationships  . Social connections:  Talks on phone: Not on file    Gets together: Not on file    Attends religious service: Not on file    Active member of club or organization: Not on file    Attends meetings of clubs or organizations: Not on file    Relationship status: Not on file  Other Topics Concern  . Not on file  Social History Narrative  . Not on file    Allergies  Allergen Reactions  . Sulfa Antibiotics Hives     Current Outpatient Medications:  .  albuterol (PROAIR HFA) 108 (90 Base) MCG/ACT inhaler, Inhale 2 puffs into the lungs every 6 (six) hours as needed for wheezing or shortness of breath., Disp: , Rfl:  .  alum & mag hydroxide-simeth (MAALOX/MYLANTA) 200-200-20 MG/5ML suspension, Take 30 mLs by mouth every 4 (four) hours as needed for indigestion., Disp: 355 mL, Rfl: 0 .  betamethasone dipropionate (DIPROLENE) 0.05 % ointment, Apply 1 application topically daily as needed., Disp: , Rfl:  .  Calcium Carbonate Antacid (TUMS PO), Take 1 tablet by mouth daily as needed (indigestion). , Disp: , Rfl:  .  carvedilol (COREG) 12.5 MG tablet, TAKE 1 TABLET TWICE A DAY, Disp: 180 tablet, Rfl: 3 .  cephALEXin (KEFLEX) 500 MG capsule, Take 2 capsules (1,000 mg total) by mouth 2 (two) times daily., Disp: 120 capsule,  Rfl: 11 .  feeding supplement, ENSURE ENLIVE, (ENSURE ENLIVE) LIQD, Take 237 mLs by mouth 2 (two) times daily between meals., Disp: 237 mL, Rfl: 12 .  ferrous sulfate 325 (65 FE) MG tablet, Take 1 tablet (325 mg total) by mouth daily with breakfast., Disp: 90 tablet, Rfl: 0 .  Fluticasone-Salmeterol (ADVAIR DISKUS) 250-50 MCG/DOSE AEPB, Inhale 1 puff into the lungs 2 (two) times daily., Disp: 60 each, Rfl: 3 .  furosemide (LASIX) 20 MG tablet, Take 1 tablet (20 mg total) by mouth daily as needed for edema., Disp: 90 tablet, Rfl: 2 .  levothyroxine (SYNTHROID) 150 MCG tablet, Take 1 tablet (150 mcg total) by mouth daily before breakfast., Disp: 90 tablet, Rfl: 3 .  Magnesium 200 MG TABS, Take 200 mg by mouth daily., Disp: , Rfl:  .  Melatonin 3 MG TABS, Take 3 mg by mouth at bedtime as needed (for sleep.). , Disp: , Rfl:  .  menthol-cetylpyridinium (CEPACOL) 3 MG lozenge, Take 1 lozenge (3 mg total) by mouth as needed for sore throat (sore throat)., Disp: 100 tablet, Rfl: 12 .  methocarbamol (ROBAXIN) 500 MG tablet, Take 1 tablet (500 mg total) by mouth every 6 (six) hours as needed for muscle spasms., Disp: 10 tablet, Rfl: 0 .  methylphenidate (RITALIN) 20 MG tablet, Take 20 mg by mouth daily. , Disp: , Rfl:  .  Naphazoline-Pheniramine (ALLERGY EYE OP), Place 1-2 drops into both eyes daily as needed (for irritated eyes)., Disp: , Rfl:  .  ondansetron (ZOFRAN) 4 MG tablet, Take 1 tablet (4 mg total) by mouth every 6 (six) hours as needed for nausea., Disp: 20 tablet, Rfl: 0 .  pantoprazole (PROTONIX) 40 MG tablet, Take 1 tablet (40 mg total) by mouth 2 (two) times daily before a meal., Disp: 60 tablet, Rfl: 0 .  polyethylene glycol (MIRALAX / GLYCOLAX) packet, Take 17 g by mouth daily as needed for mild constipation., Disp: 14 each, Rfl: 0 .  rifampin (RIFADIN) 300 MG capsule, TAKE 1 CAPSULE(300 MG) BY MOUTH EVERY 12 HOURS, Disp: 60 capsule, Rfl: 3 .  sacubitril-valsartan (ENTRESTO) 24-26 MG, Take 1  tablet by mouth 2 (two) times daily., Disp: 180 tablet, Rfl: 3 .  spironolactone (ALDACTONE) 25 MG tablet, Take 0.5 tablets (12.5 mg total) by mouth daily. K+ sparing diuretic: Hyperkalemia may occur with decreased renal function, Disp: 45 tablet, Rfl: 3 .  sucralfate (CARAFATE) 1 g tablet, Take 1 tablet (1 g total) by mouth 3 (three) times daily with meals as needed (for worsening epigastric pain/indigestion after eating)., Disp: 30 tablet, Rfl: 0 .  nitroGLYCERIN (NITROSTAT) 0.4 MG SL tablet, Place 1 tablet (0.4 mg total) every 5 (five) minutes as needed under the tongue for chest pain., Disp: 90 tablet, Rfl: 3   Review of Systems  Constitutional: Negative for chills and fever.  HENT: Negative for congestion and sore throat.   Eyes: Negative for photophobia.  Respiratory: Negative for cough, shortness of breath and wheezing.   Cardiovascular: Negative for chest pain, palpitations and leg swelling.  Gastrointestinal: Negative for abdominal pain, blood in stool, constipation, diarrhea, nausea and vomiting.  Genitourinary: Negative for dysuria, flank pain and hematuria.  Musculoskeletal: Positive for arthralgias, back pain and myalgias. Negative for neck pain.  Skin: Positive for wound. Negative for rash.  Neurological: Negative for dizziness, weakness and headaches.  Hematological: Does not bruise/bleed easily.  Psychiatric/Behavioral: Negative for suicidal ideas.       Objective:   Physical Exam  Constitutional: She is oriented to person, place, and time. She appears well-developed and well-nourished. No distress.  HENT:  Head: Normocephalic and atraumatic.  Mouth/Throat: No oropharyngeal exudate.  Eyes: Conjunctivae and EOM are normal. No scleral icterus.  Neck: Normal range of motion. Neck supple.  Cardiovascular: Normal rate and regular rhythm.  Murmur heard.  Systolic murmur is present. RUSB  Pulmonary/Chest: Effort normal. No respiratory distress. She has no wheezes.     Abdominal: She exhibits no distension.  Musculoskeletal:        General: No tenderness or edema.     Right shoulder: She exhibits no tenderness.     Right hip: She exhibits decreased range of motion.       Legs:  Neurological: She is alert and oriented to person, place, and time. She exhibits normal muscle tone. Coordination normal.  Skin: Skin is warm and dry. No rash noted. She is not diaphoretic. No pallor.  Psychiatric: She has a normal mood and affect. Her behavior is normal. Judgment and thought content normal.  Nursing note and vitals reviewed.  New pacemaker generator site: Nicely healed up.  Right prosthetic knee joint surgical scar and joint 01/27/2017:       Jun 04, 2017: Minimally tender not much in the way of effusion   General  January 09, 2018:        Assessment & Plan:    ICD infection: sp extraction and she will have completed 42 days of systemic therapy post extraction and has been on oral abx for her PJI since then space more than 4 months of antimicrobial therapy post extraction now status post reimplantation due to worsening heart failure in the absence of a device she now has a biventricular device in place again.  We will continue out with likely lifelong antimicrobial therapy    PJI sp single staged procedure; higher rate of failure with a 2 step.  We will try to give her lifelong therapy   Return to clinic in 6 months time

## 2018-01-19 ENCOUNTER — Other Ambulatory Visit (HOSPITAL_COMMUNITY): Payer: Medicare Other

## 2018-01-23 ENCOUNTER — Other Ambulatory Visit: Payer: Self-pay

## 2018-01-23 ENCOUNTER — Ambulatory Visit (HOSPITAL_COMMUNITY): Payer: Medicare Other | Attending: Cardiovascular Disease

## 2018-01-23 ENCOUNTER — Other Ambulatory Visit: Payer: Medicare Other | Admitting: *Deleted

## 2018-01-23 DIAGNOSIS — I5022 Chronic systolic (congestive) heart failure: Secondary | ICD-10-CM

## 2018-01-23 DIAGNOSIS — I428 Other cardiomyopathies: Secondary | ICD-10-CM

## 2018-01-23 DIAGNOSIS — R0602 Shortness of breath: Secondary | ICD-10-CM

## 2018-01-23 DIAGNOSIS — Z79899 Other long term (current) drug therapy: Secondary | ICD-10-CM

## 2018-01-23 DIAGNOSIS — Z950 Presence of cardiac pacemaker: Secondary | ICD-10-CM

## 2018-01-23 DIAGNOSIS — E039 Hypothyroidism, unspecified: Secondary | ICD-10-CM

## 2018-01-23 DIAGNOSIS — Z95810 Presence of automatic (implantable) cardiac defibrillator: Secondary | ICD-10-CM

## 2018-01-23 LAB — T4, FREE: Free T4: 1.26 ng/dL (ref 0.82–1.77)

## 2018-01-23 LAB — TSH: TSH: 14.62 u[IU]/mL — ABNORMAL HIGH (ref 0.450–4.500)

## 2018-01-23 LAB — T3, FREE: T3, Free: 1.8 pg/mL — ABNORMAL LOW (ref 2.0–4.4)

## 2018-01-26 ENCOUNTER — Telehealth: Payer: Self-pay | Admitting: *Deleted

## 2018-01-26 ENCOUNTER — Other Ambulatory Visit: Payer: Medicare Other

## 2018-01-26 MED ORDER — LEVOTHYROXINE SODIUM 175 MCG PO TABS: 175.0000 ug | ORAL_TABLET | Freq: Every day | ORAL | 1 refills | Status: DC

## 2018-01-26 NOTE — Telephone Encounter (Signed)
Spoke with the pt and informed her that per Dr Delton See, her echo showed that her LVEF likely improved from 30 to now 35-40%.  Informed the pt that her TSH has improved significantly but is still mildly elevated and Dr Delton See recommends that we increase her levothyroxine to 175 mcg po daily before breakfast.  Confirmed the pharmacy of choice with the pt.  Also asked the pt how she is feeling, and she states she is feeling much better now.  Pt states she is even able to do light exercises daily, without feeling so fatigued.  Informed the pt that I will make Dr Delton See aware that she is feeling much better.  Pt verbalized understanding and agrees with this plan.

## 2018-01-26 NOTE — Telephone Encounter (Signed)
-----   Message from Lars Masson, MD sent at 01/24/2018  2:09 PM EST ----- Her LVEF has likely improved from 30 to now 35 to 40%. Her TSH has improved significantly but it still mildly elevated and I will increase her levothyroxine = Synthroid to 175 mcg daily. Please ask her how she is doing.

## 2018-02-03 ENCOUNTER — Encounter: Payer: Self-pay | Admitting: Internal Medicine

## 2018-02-03 ENCOUNTER — Ambulatory Visit (INDEPENDENT_AMBULATORY_CARE_PROVIDER_SITE_OTHER): Payer: Medicare Other | Admitting: Internal Medicine

## 2018-02-03 ENCOUNTER — Ambulatory Visit (INDEPENDENT_AMBULATORY_CARE_PROVIDER_SITE_OTHER): Payer: Medicare Other

## 2018-02-03 VITALS — BP 128/78 | HR 69 | Ht <= 58 in | Wt 163.8 lb

## 2018-02-03 DIAGNOSIS — I428 Other cardiomyopathies: Secondary | ICD-10-CM

## 2018-02-03 DIAGNOSIS — R0602 Shortness of breath: Secondary | ICD-10-CM | POA: Diagnosis not present

## 2018-02-03 DIAGNOSIS — Z95810 Presence of automatic (implantable) cardiac defibrillator: Secondary | ICD-10-CM | POA: Diagnosis not present

## 2018-02-03 DIAGNOSIS — I5022 Chronic systolic (congestive) heart failure: Secondary | ICD-10-CM

## 2018-02-03 NOTE — Patient Instructions (Signed)
Medication Instructions:  Your physician has recommended you make the following change in your medication:   1 Take an extra dose of your lasix for the next 3 days.  Labwork: None ordered.  Testing/Procedures: None ordered.  Follow-Up: Your physician recommends that you schedule a follow-up appointment in:   One Year with Dr Graciela Husbands  Any Other Special Instructions Will Be Listed Below (If Applicable).     If you need a refill on your cardiac medications before your next appointment, please call your pharmacy.

## 2018-02-04 LAB — CUP PACEART INCLINIC DEVICE CHECK
Battery Remaining Longevity: 98 mo
Battery Voltage: 3.01 V
Brady Statistic AP VP Percent: 11.77 %
Brady Statistic AP VS Percent: 0.2 %
Brady Statistic AS VP Percent: 86.13 %
Brady Statistic AS VS Percent: 1.89 %
Brady Statistic RA Percent Paced: 12.22 %
Brady Statistic RV Percent Paced: 13.49 %
Date Time Interrogation Session: 20200128204024
Implantable Lead Implant Date: 20190111
Implantable Lead Location: 753858
Implantable Lead Location: 753860
Implantable Lead Model: 5076
Implantable Pulse Generator Implant Date: 20190111
Lead Channel Impedance Value: 1254 Ohm
Lead Channel Impedance Value: 1311 Ohm
Lead Channel Impedance Value: 1368 Ohm
Lead Channel Impedance Value: 1387 Ohm
Lead Channel Impedance Value: 1425 Ohm
Lead Channel Impedance Value: 380 Ohm
Lead Channel Impedance Value: 437 Ohm
Lead Channel Impedance Value: 494 Ohm
Lead Channel Pacing Threshold Amplitude: 0.5 V
Lead Channel Pacing Threshold Amplitude: 0.875 V
Lead Channel Pacing Threshold Amplitude: 3.25 V
Lead Channel Pacing Threshold Pulse Width: 0.4 ms
Lead Channel Pacing Threshold Pulse Width: 0.4 ms
Lead Channel Pacing Threshold Pulse Width: 1 ms
Lead Channel Sensing Intrinsic Amplitude: 1.375 mV
Lead Channel Sensing Intrinsic Amplitude: 1.875 mV
Lead Channel Sensing Intrinsic Amplitude: 13 mV
Lead Channel Sensing Intrinsic Amplitude: 16.375 mV
Lead Channel Setting Pacing Amplitude: 2 V
Lead Channel Setting Pacing Amplitude: 2.5 V
Lead Channel Setting Pacing Amplitude: 3.5 V
Lead Channel Setting Pacing Pulse Width: 0.4 ms
Lead Channel Setting Pacing Pulse Width: 1 ms
Lead Channel Setting Sensing Sensitivity: 0.9 mV
MDC IDC LEAD IMPLANT DT: 20190111
MDC IDC LEAD IMPLANT DT: 20190111
MDC IDC LEAD LOCATION: 753859
MDC IDC MSMT LEADCHNL LV IMPEDANCE VALUE: 1349 Ohm
MDC IDC MSMT LEADCHNL RV IMPEDANCE VALUE: 380 Ohm

## 2018-02-04 NOTE — Progress Notes (Signed)
Remote pacemaker transmission.   

## 2018-02-05 LAB — CUP PACEART REMOTE DEVICE CHECK
Battery Remaining Longevity: 109 mo
Battery Voltage: 3.01 V
Brady Statistic AP VS Percent: 0.24 %
Brady Statistic AS VP Percent: 80.8 %
Brady Statistic AS VS Percent: 3.31 %
Brady Statistic RA Percent Paced: 17 %
Brady Statistic RV Percent Paced: 17.56 %
Date Time Interrogation Session: 20200128044543
Implantable Lead Implant Date: 20190111
Implantable Lead Implant Date: 20190111
Implantable Lead Implant Date: 20190111
Implantable Lead Location: 753859
Implantable Lead Model: 5076
Implantable Pulse Generator Implant Date: 20190111
Lead Channel Impedance Value: 1254 Ohm
Lead Channel Impedance Value: 1311 Ohm
Lead Channel Impedance Value: 1311 Ohm
Lead Channel Impedance Value: 1368 Ohm
Lead Channel Impedance Value: 1387 Ohm
Lead Channel Impedance Value: 1425 Ohm
Lead Channel Impedance Value: 380 Ohm
Lead Channel Impedance Value: 380 Ohm
Lead Channel Impedance Value: 437 Ohm
Lead Channel Impedance Value: 494 Ohm
Lead Channel Impedance Value: 646 Ohm
Lead Channel Impedance Value: 684 Ohm
Lead Channel Impedance Value: 779 Ohm
Lead Channel Impedance Value: 798 Ohm
Lead Channel Pacing Threshold Amplitude: 0.5 V
Lead Channel Pacing Threshold Amplitude: 0.75 V
Lead Channel Pacing Threshold Amplitude: 3.75 V
Lead Channel Pacing Threshold Pulse Width: 0.4 ms
Lead Channel Pacing Threshold Pulse Width: 1 ms
Lead Channel Sensing Intrinsic Amplitude: 1.75 mV
Lead Channel Sensing Intrinsic Amplitude: 1.75 mV
Lead Channel Sensing Intrinsic Amplitude: 13 mV
Lead Channel Sensing Intrinsic Amplitude: 13 mV
Lead Channel Setting Pacing Amplitude: 2 V
Lead Channel Setting Pacing Amplitude: 2.5 V
Lead Channel Setting Pacing Amplitude: 3 V
Lead Channel Setting Pacing Pulse Width: 0.4 ms
Lead Channel Setting Pacing Pulse Width: 1 ms
Lead Channel Setting Sensing Sensitivity: 0.9 mV
MDC IDC LEAD LOCATION: 753858
MDC IDC LEAD LOCATION: 753860
MDC IDC MSMT LEADCHNL RV PACING THRESHOLD PULSEWIDTH: 0.4 ms
MDC IDC STAT BRADY AP VP PERCENT: 15.64 %

## 2018-02-13 NOTE — Progress Notes (Signed)
Patient Care Team: [name], MD as PCP - General (Family Medicine) Lars Masson, MD as PCP - Cardiology (Cardiology)   HPI  [name] [name] is a 83 y.o. female Seen in follow-up for nonischemic cardiomyopathy left bundle branch block and nonsustained ventricular tachycardia.   She underwent CRT Biotronik implantation  6/16.  6/18  hospitalized and found to have staph bacteremia of (MSSA) in the wake of orthopedic surgery  TEE at [facility] was reportedly negative. She was treated with  6 weeks of antibiotics  She developed recurrent 'septic arthritis" and bacteremia and TEE>>Vegetation on RA lead.  Transferred to [facility] for extraction which she tolerated well   Rx with 8 weeks of IV Abx, most recently 10/18 keflex and rifampin; these are anticipated to last at least 6 months   Because of worsening left ventricular function and worsening functional status she underwent CRT reimplantation 1/19 aware that she would need long-term chronic suppressive antibiotics  She has been considerably better following CRT implantation.  There is less shortness of breath and more energy.  Repeat echo has not yet been done.   Some weakness.  Shortness of breath is modest.  No edema.  No orthopnea.          DATE TEST EF   6/16 Echo  25 %   1/17 Echo   55-60 %   10/18 Echo   30%  Mild AS Mod MR   1/20 Echo  35-40%     Date Cr K Hgb  1/19 0.97 4.4 12.02       Past Medical History:  Diagnosis Date  . 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 Acute lower GI bleeding 05/06/2017  . Acute lower GI bleeding 05/06/2017  . Anemia 05/06/2017  . Arthritis    "finger joints" (06/21/2016)  . Asthma   . CHF (congestive heart failure) (HCC)   . Congestive dilated cardiomyopathy (HCC) 03/31/2014   a. s/p Biotronik CRTD 06/2014  . GERD (gastroesophageal reflux disease)   . Headache   .  History of blood transfusion 1990s   "related to hysterectomy"  . Hypothyroidism   . Left bundle branch block   . MSSA bacteremia 06/21/2016  . Myocardial infarction (HCC)   . Pancreatitis, acute     Past Surgical History:  Procedure Laterality Date  . ABDOMINAL HYSTERECTOMY    . APPENDECTOMY    . BIV PACEMAKER INSERTION CRT-P N/A 01/17/2017   Procedure: BIV PACEMAKER INSERTION CRT-P;  Surgeon: Duke Salvia, MD;  Location: [facility] INVASIVE CV LAB;  Service: Cardiovascular;  Laterality: N/A;  . CARDIAC CATHETERIZATION    . CARDIAC DEFIBRILLATOR PLACEMENT  ?2016  . CATARACT EXTRACTION W/ INTRAOCULAR LENS  IMPLANT, BILATERAL Bilateral   . CHOLECYSTECTOMY OPEN     "related to pancreatitis"  . DILATION AND CURETTAGE OF UTERUS    . EP IMPLANTABLE DEVICE N/A 07/04/2014   Procedure: BiV ICD Insertion CRT-D;  Surgeon: Duke Salvia, MD;  Location: [facility] INVASIVE CV LAB;  Service: Cardiovascular;  Laterality: N/A;  . ESOPHAGOGASTRODUODENOSCOPY (EGD) WITH PROPOFOL N/A 05/08/2017   Procedure: ESOPHAGOGASTRODUODENOSCOPY (EGD) WITH PROPOFOL;  Surgeon: Graylin Shiver, MD;  Location[facility] ENDOSCOPY;  Service: Endoscopy;  Laterality: N/A;  . HERNIA REPAIR    . I&D KNEE WITH POLY EXCHANGE Right 08/11/2016   Procedure: IRRIGATION AND DEBRIDEMENT KNEE WITH POLY EXCHANGE;  Surgeon:  Samson FredericSwinteck, Brian, MD;  Location: [facility] OR;  Service: Orthopedics;  Laterality: Right;  . INSERT / REPLACE / REMOVE PACEMAKER  ?2016  . JOINT REPLACEMENT Bilateral    "thumb joints; used my own material"  . JOINT REPLACEMENT    . LEFT HEART CATHETERIZATION WITH CORONARY ANGIOGRAM N/A 11/02/2013   Procedure: LEFT HEART CATHETERIZATION WITH CORONARY ANGIOGRAM;  Surgeon: Corky CraftsJayadeep S Varanasi, MD;  [facility];  Service: Cardiovascular;  Laterality: N/A;  . LOOP RECORDER IMPLANT N/A 04/13/2014   Procedure: LOOP RECORDER IMPLANT;  Surgeon: Duke SalviaSteven C Charne Mcbrien, MD;  [facility];  Service: Cardiovascular;  Laterality: N/A;  . LOOP  RECORDER REMOVAL  ?2016  . POSTERIOR LUMBAR FUSION  05/03/2016   Lumbar Two-Three, Lumbar Three-Four Posterior Lumbar Fusion withLaminectomy and Foraminotomy  . SPINE SURGERY  04/2016   lumbar   . TEE WITHOUT CARDIOVERSION N/A 08/14/2016   Procedure: TRANSESOPHAGEAL ECHOCARDIOGRAM (TEE);  Surgeon: Lars MassonNelson, Katarina H, MD;  [facility];  Service: Cardiovascular;  Laterality: N/A;  . TONSILLECTOMY    . TOTAL HIP ARTHROPLASTY Bilateral   . UMBILICAL HERNIA REPAIR  1970s?    Current Outpatient Medications  Medication Sig Dispense Refill  . albuterol (PROAIR HFA) 108 (90 Base) MCG/ACT inhaler Inhale 2 puffs into the lungs every 6 (six) hours as needed for wheezing or shortness of breath.    [name]. alum & mag hydroxide-simeth (MAALOX/MYLANTA) 200-200-20 MG/5ML suspension Take 30 mLs by mouth every 4 (four) hours as needed for indigestion. 355 mL 0  . betamethasone dipropionate (DIPROLENE) 0.05 % ointment Apply 1 application topically daily as needed.    . Calcium Carbonate Antacid (TUMS PO) Take 1 tablet by mouth daily as needed (indigestion).     . carvedilol (COREG) 12.5 MG tablet TAKE 1 TABLET TWICE A DAY 180 tablet 3  . cephALEXin (KEFLEX) 500 MG capsule Take 2 capsules (1,000 mg total) by mouth 2 (two) times daily. 120 capsule 11  . feeding supplement, ENSURE ENLIVE, (ENSURE ENLIVE) LIQD Take 237 mLs by mouth 2 (two) times daily between meals. 237 mL 12  . ferrous sulfate 325 (65 FE) MG tablet Take 1 tablet (325 mg total) by mouth daily with breakfast. 90 tablet 0  . Fluticasone-Salmeterol (ADVAIR DISKUS) 250-50 MCG/DOSE AEPB Inhale 1 puff into the lungs 2 (two) times daily. 60 each 3  . furosemide (LASIX) 20 MG tablet Take 1 tablet (20 mg total) by mouth daily as needed for edema. 90 tablet 2  . levothyroxine (SYNTHROID, LEVOTHROID) 175 MCG tablet Take 1 tablet (175 mcg total) by mouth daily before breakfast. 90 tablet 1  . Magnesium 200 MG TABS Take 200 mg by mouth daily.    . Melatonin  3 MG TABS Take 3 mg by mouth at bedtime as needed (for sleep.).     [name]. menthol-cetylpyridinium (CEPACOL) 3 MG lozenge Take 1 lozenge (3 mg total) by mouth as needed for sore throat (sore throat). 100 tablet 12  . methocarbamol (ROBAXIN) 500 MG tablet Take 1 tablet (500 mg total) by mouth every 6 (six) hours as needed for muscle spasms. 10 tablet 0  . methylphenidate (RITALIN) 20 MG tablet Take 20 mg by mouth daily.     March Rummage. Naphazoline-Pheniramine (ALLERGY EYE OP) Place 1-2 drops into both eyes daily as needed (for irritated eyes).    . ondansetron (ZOFRAN) 4 MG tablet Take 1 tablet (4 mg total) by mouth every 6 (six) hours as needed for nausea. 20 tablet 0  . pantoprazole (PROTONIX)  40 MG tablet Take 1 tablet (40 mg total) by mouth 2 (two) times daily before a meal. 60 tablet 0  . polyethylene glycol (MIRALAX / GLYCOLAX) packet Take 17 g by mouth daily as needed for mild constipation. 14 each 0  . rifampin (RIFADIN) 300 MG capsule TAKE 1 CAPSULE(300 MG) BY MOUTH EVERY 12 HOURS 60 capsule 3  . sacubitril-valsartan (ENTRESTO) 24-26 MG Take 1 tablet by mouth 2 (two) times daily. 180 tablet 3  . spironolactone (ALDACTONE) 25 MG tablet Take 0.5 tablets (12.5 mg total) by mouth daily. K+ sparing diuretic: Hyperkalemia may occur with decreased renal function 45 tablet 3  . sucralfate (CARAFATE) 1 g tablet Take 1 tablet (1 g total) by mouth 3 (three) times daily with meals as needed (for worsening epigastric pain/indigestion after eating). 30 tablet 0  . nitroGLYCERIN (NITROSTAT) 0.4 MG SL tablet Place 1 tablet (0.4 mg total) every 5 (five) minutes as needed under the tongue for chest pain. 90 tablet 3   No current facility-administered medications for this visit.     Allergies  Allergen Reactions  . Sulfa Antibiotics Hives    Review of Systems negative except from HPI and PMH  Physical Exam BP 128/78   Pulse 69   Ht 4\' 10"  (1.473 m)   Wt 163 lb 12.8 oz (74.3 kg)   LMP  (LMP Unknown)   SpO2 98%    BMI 34.23 kg/m  Well developed and Morbidly obese in no acute distress HENT normal Neck supple with JVP-flat Clear Device pocket well healed; without hematoma or erythema.  There is no tethering  Regular rate and rhythm, 2/6 murmur Abd-soft with active BS No Clubbing cyanosis   edema Skin-warm and dry A & Oriented  Grossly normal sensory and motor function   A & Oriented  Grossly normal sensory and motor function     ECG personally reviewed P synchronous pacing at 69 Intervals 18/18/48 with variable QRS morphologies and rates. Pre-implant QRS duration was 168 ms Post implant QRS duration device #1 was 110 ms is patient is  Assessment and  Plan  Nonischemic cardiomyopathy    Left bundle branch block  CRT-D  reimplantation  Congestive heart failure class III  Hypertension  Septic Arthritis--chronic antibiotic suppression     There was some threshold pacing on numerous occasions resulting in right ventricular apical pacing.  This was reprogrammed.  QRS narrowed nicely.  Cardiomyopathy may improve with improve resynchronization.  Persistent PVCs.  Functional status remains stable following CRT.  Euvolemic continue current medications.

## 2018-03-19 ENCOUNTER — Other Ambulatory Visit: Payer: Self-pay | Admitting: Behavioral Health

## 2018-03-19 DIAGNOSIS — T8450XS Infection and inflammatory reaction due to unspecified internal joint prosthesis, sequela: Secondary | ICD-10-CM

## 2018-03-19 DIAGNOSIS — T827XXA Infection and inflammatory reaction due to other cardiac and vascular devices, implants and grafts, initial encounter: Secondary | ICD-10-CM

## 2018-03-19 MED ORDER — RIFAMPIN 300 MG PO CAPS: ORAL_CAPSULE | ORAL | 1 refills | Status: DC

## 2018-03-23 ENCOUNTER — Other Ambulatory Visit: Payer: Self-pay

## 2018-03-23 ENCOUNTER — Encounter: Payer: Self-pay | Admitting: Cardiology

## 2018-03-23 ENCOUNTER — Ambulatory Visit (INDEPENDENT_AMBULATORY_CARE_PROVIDER_SITE_OTHER): Payer: Medicare Other | Admitting: Cardiology

## 2018-03-23 VITALS — BP 120/62 | HR 62 | Ht <= 58 in | Wt 165.2 lb

## 2018-03-23 DIAGNOSIS — I428 Other cardiomyopathies: Secondary | ICD-10-CM

## 2018-03-23 DIAGNOSIS — E039 Hypothyroidism, unspecified: Secondary | ICD-10-CM | POA: Diagnosis not present

## 2018-03-23 NOTE — Patient Instructions (Signed)
Medication Instructions:  Your provider recommends that you continue on your current medications as directed. Please refer to the Current Medication list given to you today.    Labwork: TODAY: BMET, TSH, LFTs, BNP, T3, T4  Follow-Up: You have an appointment with Dr. Delton See on May 27, 2018 at 10:40AM.

## 2018-03-23 NOTE — Progress Notes (Signed)
03/23/2018 [name] [name]   1935-01-08  [id number]  Primary Physician [name], MD Primary Cardiologist: Dr. Delton See Electrophysiologist: Dr. Graciela Husbands   Reason for Visit/CC: 6 month f/u for NICM  HPI:  [name] [name] is a 83 y.o. female who is being seen today for routine 6 month evaluation. She is followed by Dr. Delton See as well as Dr. Graciela Husbands. She has a h/o nonischemic cardiomyopathy, left bundle branch block, nonsustained ventricular tachycardia, HTN and pancreatitis.   She underwent CRT Biotronik implantation  06/2014.  06/2016, she was  hospitalized and found to have staph bacteremia of (MSSA) in the wake of orthopedic surgery.  TEE at [facility] was reportedly negative. She was treated with  6 weeks of antibiotics  She developed recurrent 'septic arthritis" and bacteremia and TEE confirmed Vegetation on RA lead. She was transferred to [facility] for extraction which she tolerated well.   Rx with 8 weeks of IV Abx, most recently 10/18 keflex and rifampin.  Because of worsening left ventricular function and worsening functional status, she underwent CRT reimplantation 01/2017, aware that she would need long-term chronic suppressive antibiotics. Per. Dr. Odessa Fleming recent OV note, she has been considerably better following CRT implantation.  There is less shortness of breath and more energy.  Repeat echo has not yet been done.  12/23/2017, the patient is coming after 6 months, she states that she has been feeling more tired and short of breath with minimal activity, she has no lower extremity edema, she has a rescue inhaler albuterol but does not use any maintenance inhaler.  She has been compliant with her medications and has no side effects.  She sleeps well and has no orthopnea or proximal nocturnal dyspnea.  03/23/2018 -this is 3 months follow-up, the patient has stable symptoms class IIb-III, she is now more independent as she needs to help her husband, still gets short of breath after  short distances at home but is able to perform activities of daily living with some interruptions.  She has been compliant to medications.  Her primary care physician Dr Jeannetta Nap changed Synthroid that we prescribed to a different medication whose name she does not remember.  She denies any lower extremity edema orthopnea proximal nocturnal dyspnea.  Cardiac Studies 08/14/16: TEE Study Conclusions - Left ventricle: Systolic function was normal. The estimated ejection fraction was in the range of 55% to 60%. Wall motion was normal; there were no regional wall motion abnormalities. - Aortic valve: No evidence of vegetation. There was mild regurgitation. - Aorta: There was mild non-mobile atheroma. - Mitral valve: There was mild regurgitation. - Left atrium: No evidence of thrombus in the atrial cavity or appendage. No evidence of thrombus in the atrial cavity or appendage. - Right atrium: No evidence of thrombus in the atrial cavity or appendage. No evidence of thrombus in the atrial cavity or appendage. - Tricuspid valve: No evidence of vegetation. There was mild regurgitation. - Pulmonic valve: No evidence of vegetation. Impressions: - There is a highly mobile echodensity measuring 13 x 3 mm attached to the right atrial lead in the right atrium at the tricuspid valve level. This is consistent with pacemaker endocarditis.   10/17/15: limited echo for AV optimization Impressions: - The study is performed for AV optimization There is grade 1 diastolic dysfunction The optimal AV delay seems to be around 90-100 ms but i would defer to the doctors managing the pacer.  02/01/15: TTE Study Conclusions - Left ventricle: The cavity size was normal.  There was moderate concentric hypertrophy. Systolic function was normal. The estimated ejection fraction was in the range of 50% to 55%. Wall motion was normal; there were no regional wall motion abnormalities.  Doppler parameters are consistent with abnormal left ventricular relaxation (grade 1 diastolic dysfunction). The E/e&' ratio is >15, suggesting elevated LV filling pressure. - Mitral valve: Calcified annulus. Mildly thickened leaflets . There was trivial regurgitation. - Left atrium: The atrium was normal in size. - Right ventricle: The cavity size was normal. Wall thickness was normal. Pacer wire or catheter noted in right ventricle. Systolic function was normal. - Right atrium: The atrium was normal in size. Pacer wire or catheter noted in right atrium. - Tricuspid valve: There was moderate regurgitation. - Pulmonary arteries: PA peak pressure: 30 mm Hg (S). - Inferior vena cava: The vessel was normal in size. The respirophasic diameter changes were in the normal range (= 50%), consistent with normal central venous pressure. Impressions: - Compared to a prior study in 09/2014, the EF has improved to 50-55%.    Current Meds  Medication Sig  . albuterol (PROAIR HFA) 108 (90 Base) MCG/ACT inhaler Inhale 2 puffs into the lungs every 6 (six) hours as needed for wheezing or shortness of breath.  [name] alum & mag hydroxide-simeth (MAALOX/MYLANTA) 200-200-20 MG/5ML suspension Take 30 mLs by mouth every 4 (four) hours as needed for indigestion.  . betamethasone dipropionate (DIPROLENE) 0.05 % ointment Apply 1 application topically daily as needed.  . Calcium Carbonate Antacid (TUMS PO) Take 1 tablet by mouth daily as needed (indigestion).   . carvedilol (COREG) 12.5 MG tablet TAKE 1 TABLET TWICE A DAY  . cephALEXin (KEFLEX) 500 MG capsule Take 2 capsules (1,000 mg total) by mouth 2 (two) times daily.  . feeding supplement, ENSURE ENLIVE, (ENSURE ENLIVE) LIQD Take 237 mLs by mouth 2 (two) times daily between meals.  . ferrous sulfate 325 (65 FE) MG tablet Take 1 tablet (325 mg total) by mouth daily with breakfast.  . Fluticasone-Salmeterol (ADVAIR DISKUS) 250-50 MCG/DOSE AEPB  Inhale 1 puff into the lungs 2 (two) times daily.  . furosemide (LASIX) 20 MG tablet Take 1 tablet (20 mg total) by mouth daily as needed for edema.  [name] levothyroxine (SYNTHROID, LEVOTHROID) 175 MCG tablet Take 1 tablet (175 mcg total) by mouth daily before breakfast.  . Magnesium 200 MG TABS Take 200 mg by mouth daily.  . Melatonin 3 MG TABS Take 3 mg by mouth at bedtime as needed (for sleep.).   [name] menthol-cetylpyridinium (CEPACOL) 3 MG lozenge Take 1 lozenge (3 mg total) by mouth as needed for sore throat (sore throat).  . methocarbamol (ROBAXIN) 500 MG tablet Take 1 tablet (500 mg total) by mouth every 6 (six) hours as needed for muscle spasms.  . methylphenidate (RITALIN) 20 MG tablet Take 20 mg by mouth daily.   March Rummage (ALLERGY EYE OP) Place 1-2 drops into both eyes daily as needed (for irritated eyes).  . naproxen (NAPROSYN) 250 MG tablet Take 250 mg by mouth 2 (two) times daily as needed.   . ondansetron (ZOFRAN) 4 MG tablet Take 1 tablet (4 mg total) by mouth every 6 (six) hours as needed for nausea.  . pantoprazole (PROTONIX) 40 MG tablet Take 1 tablet (40 mg total) by mouth 2 (two) times daily before a meal.  . polyethylene glycol (MIRALAX / GLYCOLAX) packet Take 17 g by mouth daily as needed for mild constipation.  . rifampin (RIFADIN) 300 MG capsule TAKE 1 CAPSULE(300  MG) BY MOUTH EVERY 12 HOURS  . sacubitril-valsartan (ENTRESTO) 24-26 MG Take 1 tablet by mouth 2 (two) times daily.  [name] spironolactone (ALDACTONE) 25 MG tablet Take 0.5 tablets (12.5 mg total) by mouth daily. K+ sparing diuretic: Hyperkalemia may occur with decreased renal function  . sucralfate (CARAFATE) 1 g tablet Take 1 tablet (1 g total) by mouth 3 (three) times daily with meals as needed (for worsening epigastric pain/indigestion after eating).   Allergies  Allergen Reactions  . Sulfa Antibiotics Hives   Past Medical History:  Diagnosis Date  . Acrodermatitis continua of [place]    "was in  remission for 5 years the 1st time; ~ 7 years the second time" (06/21/2016)  . Acute lower GI bleeding 05/06/2017  . Acute lower GI bleeding 05/06/2017  . Anemia 05/06/2017  . Arthritis    "finger joints" (06/21/2016)  . Asthma   . CHF (congestive heart failure) (HCC)   . Congestive dilated cardiomyopathy (HCC) 03/31/2014   a. s/p Biotronik CRTD 06/2014  . GERD (gastroesophageal reflux disease)   . Headache   . History of blood transfusion 1990s   "related to hysterectomy"  . Hypothyroidism   . Left bundle branch block   . MSSA bacteremia 06/21/2016  . Myocardial infarction (HCC)   . Pancreatitis, acute    Family History  Problem Relation Age of Onset  . Hyperlipidemia Mother   . Arrhythmia Mother        palpitations  . Heart disease Father   . Heart attack Father   . Heart attack Maternal Grandfather   . Heart attack Paternal Grandfather    Past Surgical History:  Procedure Laterality Date  . ABDOMINAL HYSTERECTOMY    . APPENDECTOMY    . BIV PACEMAKER INSERTION CRT-P N/A 01/17/2017   Procedure: BIV PACEMAKER INSERTION CRT-P;  Surgeon: Duke Salvia, MD;  Location[facility] INVASIVE CV LAB;  Service: Cardiovascular;  Laterality: N/A;  . CARDIAC CATHETERIZATION    . CARDIAC DEFIBRILLATOR PLACEMENT  ?2016  . CATARACT EXTRACTION W/ INTRAOCULAR LENS  IMPLANT, BILATERAL Bilateral   . CHOLECYSTECTOMY OPEN     "related to pancreatitis"  . DILATION AND CURETTAGE OF UTERUS    . EP IMPLANTABLE DEVICE N/A 07/04/2014   Procedure: BiV ICD Insertion CRT-D;  Surgeon: Duke Salvia, MD;  Location: [facility] INVASIVE CV LAB;  Service: Cardiovascular;  Laterality: N/A;  . ESOPHAGOGASTRODUODENOSCOPY (EGD) WITH PROPOFOL N/A 05/08/2017   Procedure: ESOPHAGOGASTRODUODENOSCOPY (EGD) WITH PROPOFOL;  Surgeon: Graylin Shiver, MD;  Location: [facility] ENDOSCOPY;  Service: Endoscopy;  Laterality: N/A;  . HERNIA REPAIR    . I&D KNEE WITH POLY EXCHANGE Right 08/11/2016   Procedure: IRRIGATION AND DEBRIDEMENT KNEE WITH POLY  EXCHANGE;  Surgeon: Samson Frederic, MD;  Location: [facility] OR;  Service: Orthopedics;  Laterality: Right;  . INSERT / REPLACE / REMOVE PACEMAKER  ?2016  . JOINT REPLACEMENT Bilateral    "thumb joints; used my own material"  . JOINT REPLACEMENT    . LEFT HEART CATHETERIZATION WITH CORONARY ANGIOGRAM N/A 11/02/2013   Procedure: LEFT HEART CATHETERIZATION WITH CORONARY ANGIOGRAM;  Surgeon: Corky Crafts, MD;  Location: [facility] CATH LAB;  Service: Cardiovascular;  Laterality: N/A;  . LOOP RECORDER IMPLANT N/A 04/13/2014   Procedure: LOOP RECORDER IMPLANT;  Surgeon: Duke Salvia, MD;  Location: [facility] CATH LAB;  Service: Cardiovascular;  Laterality: N/A;  . LOOP RECORDER REMOVAL  ?2016  . POSTERIOR LUMBAR FUSION  05/03/2016   Lumbar Two-Three, Lumbar Three-Four Posterior Lumbar Fusion withLaminectomy and  Foraminotomy  . SPINE SURGERY  04/2016   lumbar   . TEE WITHOUT CARDIOVERSION N/A 08/14/2016   Procedure: TRANSESOPHAGEAL ECHOCARDIOGRAM (TEE);  Surgeon: Lars Masson, MD;  Location: [facility] ENDOSCOPY;  Service: Cardiovascular;  Laterality: N/A;  . TONSILLECTOMY    . TOTAL HIP ARTHROPLASTY Bilateral   . UMBILICAL HERNIA REPAIR  1970s?   Social History   Socioeconomic History  . Marital status: Married    Spouse name: Not on file  . Number of children: Not on file  . Years of education: Not on file  . Highest education level: Not on file  Occupational History  . Not on file  Social Needs  . Financial resource strain: Not on file  . Food insecurity:    Worry: Not on file    Inability: Not on file  . Transportation needs:    Medical: Not on file    Non-medical: Not on file  Tobacco Use  . Smoking status: Never Smoker  . Smokeless tobacco: Never Used  . Tobacco comment: "smoked as a teenager; for maybe 1 wk"  Substance and Sexual Activity  . Alcohol use: Yes    Comment: 6/15//2018 "glass of wine q 6 months or so"  . Drug use: No  . Sexual activity: Not on file  Lifestyle  . Physical  activity:    Days per week: Not on file    Minutes per session: Not on file  . Stress: Not on file  Relationships  . Social connections:    Talks on phone: Not on file    Gets together: Not on file    Attends religious service: Not on file    Active member of club or organization: Not on file    Attends meetings of clubs or organizations: Not on file    Relationship status: Not on file  . Intimate partner violence:    Fear of current or ex partner: Not on file    Emotionally abused: Not on file    Physically abused: Not on file    Forced sexual activity: Not on file  Other Topics Concern  . Not on file  Social History Narrative  . Not on file    Review of Systems: General: negative for chills, fever, night sweats or weight changes.  Cardiovascular: negative for chest pain, dyspnea on exertion, edema, orthopnea, palpitations, paroxysmal nocturnal dyspnea or shortness of breath Dermatological: negative for rash Respiratory: negative for cough or wheezing Urologic: negative for hematuria Abdominal: negative for nausea, vomiting, diarrhea, bright red blood per rectum, melena, or hematemesis Neurologic: negative for visual changes, syncope, or dizziness All other systems reviewed and are otherwise negative except as noted above.  Physical Exam:  Blood pressure 120/62, pulse 62, height 4\' 10"  (1.473 m), weight 165 lb 3.2 oz (74.9 kg), SpO2 95 %.  General appearance: alert, cooperative and no distress Neck: no carotid bruit and no JVD Lungs: clear to auscultation bilaterally Heart: regular rate and rhythm, S1, S2 normal, no murmur, click, rub or gallop Extremities: extremities normal, atraumatic, no cyanosis or edema Pulses: 2+ and symmetric Skin: Skin color, texture, turgor normal. No rashes or lesions Neurologic: Grossly normal  EKG performed today, shows atrial sensed ventricular paced rhythm, QRS duration 128 ms, unchanged from prior -- personally reviewed    ASSESSMENT AND  PLAN:   1.  Acute on chronic chronic systolic HF: She appears euvolemic.  At the last visit in December her creatinine was 1.1, BNP 130, her weight is stable at  165 pounds.  Her symptoms are stable NYHA class IIb-III.  2.  Nonischemic cardiomyopathy -LVEF 30 to 35%, initially improvement to 50 to 55% with a BiV ICD, however her device get infected had to be removed she received another device in January 2019.  Repeat echocardiogram showed improvement of LVEF to 35 to 40%.  3. BiV/ICD: history outline above in HPI. Required extraction due to bacteremia/ lead vegetation. Underwent re-implantation 01/2017.  Followed by Dr. Graciela Husbands. Functionally she seemed to be doing quite well following CRT implantation.  QRS is considerably narrowed.    Follow-Up w/ Dr. Delton See in 3 months.   Tobias Alexander , MD [facility] 03/23/2018 3:56 PM

## 2018-03-25 LAB — BASIC METABOLIC PANEL
BUN/Creatinine Ratio: 21 (ref 12–28)
BUN: 22 mg/dL (ref 8–27)
CO2: 24 mmol/L (ref 20–29)
Calcium: 9.7 mg/dL (ref 8.7–10.3)
Chloride: 105 mmol/L (ref 96–106)
Creatinine, Ser: 1.06 mg/dL — ABNORMAL HIGH (ref 0.57–1.00)
GFR calc Af Amer: 57 mL/min/{1.73_m2} — ABNORMAL LOW (ref >59)
GFR calc non Af Amer: 50 mL/min/{1.73_m2} — ABNORMAL LOW (ref >59)
Glucose: 81 mg/dL (ref 65–99)
Potassium: 5.1 mmol/L (ref 3.5–5.2)
Sodium: 143 mmol/L (ref 134–144)

## 2018-03-25 LAB — HEPATIC FUNCTION PANEL
ALT: 12 IU/L (ref 0–32)
AST: 23 IU/L (ref 0–40)
Albumin: 4.6 g/dL (ref 3.7–4.7)
Alkaline Phosphatase: 47 IU/L (ref 39–117)
Bilirubin Total: 0.3 mg/dL (ref 0.0–1.2)
Bilirubin, Direct: 0.1 mg/dL (ref 0.00–0.40)
Total Protein: 6.5 g/dL (ref 6.0–8.5)

## 2018-03-25 LAB — T3, FREE: T3, Free: 2 pg/mL (ref 2.0–4.4)

## 2018-03-25 LAB — PRO B NATRIURETIC PEPTIDE: NT-Pro BNP: 165 pg/mL (ref 0–738)

## 2018-03-25 LAB — T4, FREE: Free T4: 1.04 ng/dL (ref 0.82–1.77)

## 2018-03-25 LAB — TSH: TSH: 24.97 u[IU]/mL — ABNORMAL HIGH (ref 0.450–4.500)

## 2018-05-05 ENCOUNTER — Ambulatory Visit (INDEPENDENT_AMBULATORY_CARE_PROVIDER_SITE_OTHER): Payer: Medicare Other | Admitting: *Deleted

## 2018-05-05 ENCOUNTER — Other Ambulatory Visit: Payer: Self-pay

## 2018-05-05 DIAGNOSIS — I428 Other cardiomyopathies: Secondary | ICD-10-CM | POA: Diagnosis not present

## 2018-05-05 LAB — CUP PACEART REMOTE DEVICE CHECK
Battery Remaining Longevity: 105 mo
Battery Voltage: 3 V
Brady Statistic AP VP Percent: 13.57 %
Brady Statistic AP VS Percent: 0.28 %
Brady Statistic AS VP Percent: 83.19 %
Brady Statistic AS VS Percent: 2.95 %
Brady Statistic RA Percent Paced: 14.77 %
Brady Statistic RV Percent Paced: 11.49 %
Date Time Interrogation Session: 20200428045100
Implantable Lead Implant Date: 20190111
Implantable Lead Implant Date: 20190111
Implantable Lead Implant Date: 20190111
Implantable Lead Location: 753858
Implantable Lead Location: 753859
Implantable Lead Location: 753860
Implantable Lead Model: 5076
Implantable Lead Model: 5076
Implantable Pulse Generator Implant Date: 20190111
Lead Channel Impedance Value: 1311 Ohm
Lead Channel Impedance Value: 1349 Ohm
Lead Channel Impedance Value: 1387 Ohm
Lead Channel Impedance Value: 1425 Ohm
Lead Channel Impedance Value: 1463 Ohm
Lead Channel Impedance Value: 1463 Ohm
Lead Channel Impedance Value: 342 Ohm
Lead Channel Impedance Value: 361 Ohm
Lead Channel Impedance Value: 380 Ohm
Lead Channel Impedance Value: 437 Ohm
Lead Channel Impedance Value: 665 Ohm
Lead Channel Impedance Value: 722 Ohm
Lead Channel Impedance Value: 798 Ohm
Lead Channel Impedance Value: 836 Ohm
Lead Channel Pacing Threshold Amplitude: 0.5 V
Lead Channel Pacing Threshold Amplitude: 0.75 V
Lead Channel Pacing Threshold Amplitude: 2.125 V
Lead Channel Pacing Threshold Pulse Width: 0.4 ms
Lead Channel Pacing Threshold Pulse Width: 0.4 ms
Lead Channel Pacing Threshold Pulse Width: 1 ms
Lead Channel Sensing Intrinsic Amplitude: 1.375 mV
Lead Channel Sensing Intrinsic Amplitude: 1.375 mV
Lead Channel Sensing Intrinsic Amplitude: 14.25 mV
Lead Channel Sensing Intrinsic Amplitude: 14.25 mV
Lead Channel Setting Pacing Amplitude: 2 V
Lead Channel Setting Pacing Amplitude: 2.25 V
Lead Channel Setting Pacing Amplitude: 2.5 V
Lead Channel Setting Pacing Pulse Width: 0.4 ms
Lead Channel Setting Pacing Pulse Width: 1 ms
Lead Channel Setting Sensing Sensitivity: 0.9 mV

## 2018-05-14 ENCOUNTER — Telehealth: Payer: Self-pay | Admitting: *Deleted

## 2018-05-14 NOTE — Telephone Encounter (Signed)
Virtual Visit Pre-Appointment Phone Call  "(Name), I am calling you today to discuss your upcoming appointment. We are currently trying to limit exposure to the virus that causes COVID-19 by seeing patients at home rather than in the office."  1. "What is the BEST phone number to call the day of the visit?" - include this in appointment notes-YES UPDATED IN APPT NOTES  2. "Do you have or have access to (through a family member/friend) a smartphone with video capability that we can use for your visit?"  a. If no - list the appointment type as a PHONE visit in appointment notes-PT PREFERS TELEPHONE VISIT INSTEAD--EASIER FOR HER  3. Confirm consent - "In the setting of the current Covid19 crisis, you are scheduled for a (phone ) visit with Dr. Delton See on 5/20 at 12:20 pm .  Just as we do with many in-office visits, in order for you to participate in this visit, we must obtain consent.  If you'd like, I can send this to your mychart (if signed up) or email for you to review.  Otherwise, I can obtain your verbal consent now.  All virtual visits are billed to your insurance company just like a normal visit would be.  By agreeing to a virtual visit, we'd like you to understand that the technology does not allow for your provider to perform an examination, and thus may limit your provider's ability to fully assess your condition. If your provider identifies any concerns that need to be evaluated in person, we will make arrangements to do so.  Finally, though the technology is pretty good, we cannot assure that it will always work on either your or our end, and in the setting of a video visit, we may have to convert it to a phone-only visit.  In either situation, we cannot ensure that we have a secure connection.  Are you willing to proceed?" STAFF: Did the patient verbally acknowledge consent to telehealth visit? Document YES/NO here: YES PT GAVE VERBAL CONSENT FOR DR NELSON TO TREAT HER VIA TELEPHONE VISIT  4. Advise patient to be prepared - "Two hours prior to your appointment, go ahead and check your blood pressure, pulse, oxygen saturation, and your weight (if you have the equipment to check those) and write them all down. When your visit starts, your provider will ask you for this information. If you have an Apple Watch or Kardia device, please plan to have heart rate information ready on the day of your appointment. Please have a pen and paper handy nearby the day of the visit as well." YES PT IS AWARE OF THIS AND WILL TAKE HER BP/HR/Wt THAT MORNING AND WRITE THIS DOWN  5. Give patient instructions for REGULAR TELEPHONE VISIT  6. Inform patient they will receive a phone call 15 minutes prior to their appointment time (may be from unknown caller ID) so they should be prepared to answer-YES    TELEPHONE CALL NOTE  Blima C Beckford has been deemed a candidate for a follow-up tele-health visit to limit community exposure during the Covid-19 pandemic. I spoke with the patient via phone to ensure availability of phone/video source, confirm preferred email & phone number, and discuss instructions and expectations.  I reminded Dashley C Ragon to be prepared with any vital sign and/or heart rhythm information that could potentially be obtained via home monitoring, at the time of her visit. I reminded [name] to expect a phone call prior to her visit.  [employer]  Judie Petit, LPN 04/09/3534 14:43 PM       FULL LENGTH CONSENT FOR TELE-HEALTH VISIT   I hereby voluntarily request, consent and authorize [facility] and its employed or contracted physicians, physician assistants, nurse practitioners or other licensed health care professionals (the Practitioner), to provide me with telemedicine health care services (the "Services") as deemed necessary by the treating Practitioner. I acknowledge and consent to receive the Services by the Practitioner via telemedicine. I understand that the telemedicine visit  will involve communicating with the Practitioner through live audiovisual communication technology and the disclosure of certain medical information by electronic transmission. I acknowledge that I have been given the opportunity to request an in-person assessment or other available alternative prior to the telemedicine visit and am voluntarily participating in the telemedicine visit.  I understand that I have the right to withhold or withdraw my consent to the use of telemedicine in the course of my care at any time, without affecting my right to future care or treatment, and that the Practitioner or I may terminate the telemedicine visit at any time. I understand that I have the right to inspect all information obtained and/or recorded in the course of the telemedicine visit and may receive copies of available information for a reasonable fee.  I understand that some of the potential risks of receiving the Services via telemedicine include:  [name] Delay or interruption in medical evaluation due to technological equipment failure or disruption; . Information transmitted may not be sufficient (e.g. poor resolution of images) to allow for appropriate medical decision making by the Practitioner; and/or  . In rare instances, security protocols could fail, causing a breach of personal health information.  Furthermore, I acknowledge that it is my responsibility to provide information about my medical history, conditions and care that is complete and accurate to the best of my ability. I acknowledge that Practitioner's advice, recommendations, and/or decision may be based on factors not within their control, such as incomplete or inaccurate data provided by me or distortions of diagnostic images or specimens that may result from electronic transmissions. I understand that the practice of medicine is not an exact science and that Practitioner makes no warranties or guarantees regarding treatment outcomes. I acknowledge  that I will receive a copy of this consent concurrently upon execution via email to the email address I last provided but may also request a printed copy by calling the office of [facility].    I understand that my insurance will be billed for this visit.   I have read or had this consent read to me. . I understand the contents of this consent, which adequately explains the benefits and risks of the Services being provided via telemedicine.  . I have been provided ample opportunity to ask questions regarding this consent and the Services and have had my questions answered to my satisfaction. . I give my informed consent for the services to be provided through the use of telemedicine in my medical care  By participating in this telemedicine visit I agree to the above.  PT GAVE VERBAL CONSENT FOR DR NELSON TO TREAT HER VIA TELEPHONE VISIT ON 05/27/18.

## 2018-05-14 NOTE — Progress Notes (Signed)
Remote pacemaker transmission.   

## 2018-05-27 ENCOUNTER — Telehealth (INDEPENDENT_AMBULATORY_CARE_PROVIDER_SITE_OTHER): Payer: Medicare Other | Admitting: Cardiology

## 2018-05-27 ENCOUNTER — Telehealth: Payer: Self-pay | Admitting: *Deleted

## 2018-05-27 ENCOUNTER — Telehealth: Payer: Self-pay | Admitting: Licensed Clinical Social Worker

## 2018-05-27 ENCOUNTER — Ambulatory Visit: Payer: Medicare Other | Admitting: Cardiology

## 2018-05-27 ENCOUNTER — Encounter: Payer: Self-pay | Admitting: Cardiology

## 2018-05-27 ENCOUNTER — Other Ambulatory Visit: Payer: Self-pay

## 2018-05-27 VITALS — BP 131/74 | HR 65 | Ht <= 58 in | Wt 159.0 lb

## 2018-05-27 DIAGNOSIS — R0602 Shortness of breath: Secondary | ICD-10-CM

## 2018-05-27 DIAGNOSIS — Z950 Presence of cardiac pacemaker: Secondary | ICD-10-CM

## 2018-05-27 DIAGNOSIS — I5022 Chronic systolic (congestive) heart failure: Secondary | ICD-10-CM

## 2018-05-27 DIAGNOSIS — I428 Other cardiomyopathies: Secondary | ICD-10-CM | POA: Diagnosis not present

## 2018-05-27 NOTE — Progress Notes (Signed)
Virtual Visit via Telephone Note   This visit type was conducted due to national recommendations for restrictions regarding the COVID-19 Pandemic (e.g. social distancing) in an effort to limit this patient's exposure and mitigate transmission in our community.  Due to her co-morbid illnesses, this patient is at least at moderate risk for complications without adequate follow up.  This format is felt to be most appropriate for this patient at this time.  The patient did not have access to video technology/had technical difficulties with video requiring transitioning to audio format only (telephone).  All issues noted in this document were discussed and addressed.  No physical exam could be performed with this format.  Please refer to the patient's chart for her  consent to telehealth for [facility].   Date:  05/27/2018   ID:  [name] [name], DOB 09/18/1935, MRN [id number]  Patient Location: Home Provider Location: Home  PCP:  [name], MD  Cardiologist:  Tobias Alexander, MD  Electrophysiologist: Dr Graciela Husbands  Evaluation Performed:  Follow-Up Visit  Reason for visit: 2 months follow-up  History of Present Illness:    [name] [name] is a 83 y.o. female with h/o nonischemic cardiomyopathy, left bundle branch block, nonsustained ventricular tachycardia, HTN and pancreatitis.   She underwent CRT Biotronik implantation  06/2014.  06/2016, she was  hospitalized and found to have staph bacteremia of (MSSA) in the wake of orthopedic surgery.  TEE at [facility] was reportedly negative. She was treated with  6 weeks of antibiotics  She developed recurrent 'septic arthritis" and bacteremia and TEE confirmed Vegetation on RA lead. She was transferred to [facility] for extraction which she tolerated well.   Rx with 8 weeks of IV Abx, most recently 10/18 keflex and rifampin.  Because of worsening left ventricular function and worsening functional status, she underwent CRT reimplantation  01/2017, aware that she would need long-term chronic suppressive antibiotics. Per. Dr. Odessa Fleming recent OV note, she has been considerably better following CRT implantation.  There is less shortness of breath and more energy.  Repeat echo has not yet been done.  12/23/2017, the patient is coming after 6 months, she states that she has been feeling more tired and short of breath with minimal activity, she has no lower extremity edema, she has a rescue inhaler albuterol but does not use any maintenance inhaler.  She has been compliant with her medications and has no side effects.  She sleeps well and has no orthopnea or proximal nocturnal dyspnea.  03/23/2018 -this is 3 months follow-up, the patient has stable symptoms class IIb-III, she is now more independent as she needs to help her husband, still gets short of breath after short distances at home but is able to perform activities of daily living with some interruptions.  She has been compliant to medications.  Her primary care physician Dr Jeannetta Nap changed Synthroid that we prescribed to a different medication whose name she does not remember.  She denies any lower extremity edema orthopnea proximal nocturnal dyspnea.  05/27/2018 - 2 months follow up, she and her husband has been staying at home mostly, she has been trying to walk 20 minutes a day outside and do leg exercises, she has lost 6 pounds, she is able to walk outside and her shortness of breath gets better with walking however she has noticed worsening dyspnea when on incline or when walking stairs.  She denies any palpitation dizziness or falls.  She has been compliant with her medications and  has no side effects.  Prior CV studies:   The following studies were reviewed today:  08/14/16: TEE Study Conclusions - Left ventricle: Systolic function was normal. The estimated ejection fraction was in the range of 55% to 60%. Wall motion was normal; there were no regional wall motion  abnormalities. - Aortic valve: No evidence of vegetation. There was mild regurgitation. - Aorta: There was mild non-mobile atheroma. - Mitral valve: There was mild regurgitation. - Left atrium: No evidence of thrombus in the atrial cavity or appendage. No evidence of thrombus in the atrial cavity or appendage. - Right atrium: No evidence of thrombus in the atrial cavity or appendage. No evidence of thrombus in the atrial cavity or appendage. - Tricuspid valve: No evidence of vegetation. There was mild regurgitation. - Pulmonic valve: No evidence of vegetation. Impressions: - There is a highly mobile echodensity measuring 13 x 3 mm attached to the right atrial lead in the right atrium at the tricuspid valve level. This is consistent with pacemaker endocarditis.  10/17/15: limited echo for AV optimization Impressions: - The study is performed for AV optimization There is grade 1 diastolic dysfunction The optimal AV delay seems to be around 90-100 ms but i would defer to the doctors managing the pacer.  02/01/15: TTE Study Conclusions - Left ventricle: The cavity size was normal. There was moderate concentric hypertrophy. Systolic function was normal. The estimated ejection fraction was in the range of 50% to 55%. Wall motion was normal; there were no regional wall motion abnormalities. Doppler parameters are consistent with abnormal left ventricular relaxation (grade 1 diastolic dysfunction). The E/e&' ratio is >15, suggesting elevated LV filling pressure. - Mitral valve: Calcified annulus. Mildly thickened leaflets . There was trivial regurgitation. - Left atrium: The atrium was normal in size. - Right ventricle: The cavity size was normal. Wall thickness was normal. Pacer wire or catheter noted in right ventricle. Systolic function was normal. - Right atrium: The atrium was normal in size. Pacer wire or catheter noted in right  atrium. - Tricuspid valve: There was moderate regurgitation. - Pulmonary arteries: PA peak pressure: 30 mm Hg (S). - Inferior vena cava: The vessel was normal in size. The respirophasic diameter changes were in the normal range (= 50%), consistent with normal central venous pressure. Impressions: - Compared to a prior study in 09/2014, the EF has improved to 50-55%.  The patient does not have symptoms concerning for COVID-19 infection (fever, chills, cough, or new shortness of breath).    Past Medical History:  Diagnosis Date   Acrodermatitis continua of [place]    "was in remission for 5 years the 1st time; ~ 7 years the second time" (06/21/2016)   Acute lower GI bleeding 05/06/2017   Acute lower GI bleeding 05/06/2017   Anemia 05/06/2017   Arthritis    "finger joints" (06/21/2016)   Asthma    CHF (congestive heart failure) (HCC)    Congestive dilated cardiomyopathy (HCC) 03/31/2014   a. s/p Biotronik CRTD 06/2014   GERD (gastroesophageal reflux disease)    Headache    History of blood transfusion 1990s   "related to hysterectomy"   Hypothyroidism    Left bundle branch block    MSSA bacteremia 06/21/2016   Myocardial infarction (HCC)    Pancreatitis, acute    Past Surgical History:  Procedure Laterality Date   ABDOMINAL HYSTERECTOMY     APPENDECTOMY     BIV PACEMAKER INSERTION CRT-P N/A 01/17/2017   Procedure: BIV PACEMAKER INSERTION CRT-P;  Surgeon: Duke Salvia, MD;  Location: [facility] INVASIVE CV LAB;  Service: Cardiovascular;  Laterality: N/A;   CARDIAC CATHETERIZATION     CARDIAC DEFIBRILLATOR PLACEMENT  ?2016   CATARACT EXTRACTION W/ INTRAOCULAR LENS  IMPLANT, BILATERAL Bilateral    CHOLECYSTECTOMY OPEN     "related to pancreatitis"   DILATION AND CURETTAGE OF UTERUS     EP IMPLANTABLE DEVICE N/A 07/04/2014   Procedure: BiV ICD Insertion CRT-D;  Surgeon: Duke Salvia, MD;  Location: [facility] INVASIVE CV LAB;  Service: Cardiovascular;   Laterality: N/A;   ESOPHAGOGASTRODUODENOSCOPY (EGD) WITH PROPOFOL N/A 05/08/2017   Procedure: ESOPHAGOGASTRODUODENOSCOPY (EGD) WITH PROPOFOL;  Surgeon: Graylin Shiver, MD;  Location: [facility] ENDOSCOPY;  Service: Endoscopy;  Laterality: N/A;   HERNIA REPAIR     I&D KNEE WITH POLY EXCHANGE Right 08/11/2016   Procedure: IRRIGATION AND DEBRIDEMENT KNEE WITH POLY EXCHANGE;  Surgeon: Samson Frederic, MD;  Location: [facility] OR;  Service: Orthopedics;  Laterality: Right;   INSERT / REPLACE / REMOVE PACEMAKER  ?2016   JOINT REPLACEMENT Bilateral    "thumb joints; used my own material"   JOINT REPLACEMENT     LEFT HEART CATHETERIZATION WITH CORONARY ANGIOGRAM N/A 11/02/2013   Procedure: LEFT HEART CATHETERIZATION WITH CORONARY ANGIOGRAM;  Surgeon: Corky Crafts, MD;  Location: [facility] CATH LAB;  Service: Cardiovascular;  Laterality: N/A;   LOOP RECORDER IMPLANT N/A 04/13/2014   Procedure: LOOP RECORDER IMPLANT;  Surgeon: Duke Salvia, MD;  Location: [facility] CATH LAB;  Service: Cardiovascular;  Laterality: N/A;   LOOP RECORDER REMOVAL  ?2016   POSTERIOR LUMBAR FUSION  05/03/2016   Lumbar Two-Three, Lumbar Three-Four Posterior Lumbar Fusion withLaminectomy and Foraminotomy   SPINE SURGERY  04/2016   lumbar    TEE WITHOUT CARDIOVERSION N/A 08/14/2016   Procedure: TRANSESOPHAGEAL ECHOCARDIOGRAM (TEE);  Surgeon: Lars Masson, MD;  Location: [facility] ENDOSCOPY;  Service: Cardiovascular;  Laterality: N/A;   TONSILLECTOMY     TOTAL HIP ARTHROPLASTY Bilateral    UMBILICAL HERNIA REPAIR  1970s?     Current Meds  Medication Sig   albuterol (PROAIR HFA) 108 (90 Base) MCG/ACT inhaler Inhale 2 puffs into the lungs every 6 (six) hours as needed for wheezing or shortness of breath.   betamethasone dipropionate (DIPROLENE) 0.05 % ointment Apply 1 application topically daily as needed.   Calcium Carbonate Antacid (TUMS PO) Take 1 tablet by mouth daily as needed (indigestion).    carvedilol (COREG) 12.5 MG tablet  TAKE 1 TABLET TWICE A DAY   cephALEXin (KEFLEX) 500 MG capsule Take 2 capsules (1,000 mg total) by mouth 2 (two) times daily.   feeding supplement, ENSURE ENLIVE, (ENSURE ENLIVE) LIQD Take 237 mLs by mouth 2 (two) times daily between meals.   Fluticasone-Salmeterol (ADVAIR DISKUS) 250-50 MCG/DOSE AEPB Inhale 1 puff into the lungs 2 (two) times daily.   furosemide (LASIX) 20 MG tablet Take 1 tablet (20 mg total) by mouth daily as needed for edema.   levothyroxine (SYNTHROID) 112 MCG tablet Take 112 mcg by mouth daily before breakfast.   Melatonin 3 MG TABS Take 3 mg by mouth at bedtime as needed (for sleep.).    menthol-cetylpyridinium (CEPACOL) 3 MG lozenge Take 1 lozenge (3 mg total) by mouth as needed for sore throat (sore throat).   methocarbamol (ROBAXIN) 500 MG tablet Take 1 tablet (500 mg total) by mouth every 6 (six) hours as needed for muscle spasms.   methylphenidate (RITALIN) 20 MG tablet Take 20 mg by mouth daily.  Naphazoline-Pheniramine (ALLERGY EYE OP) Place 1-2 drops into both eyes daily as needed (for irritated eyes).   naproxen (NAPROSYN) 250 MG tablet Take 250 mg by mouth 2 (two) times daily as needed.    ondansetron (ZOFRAN) 8 MG tablet Take 8 mg by mouth as needed.   polyethylene glycol (MIRALAX / GLYCOLAX) packet Take 17 g by mouth daily as needed for mild constipation.   rifampin (RIFADIN) 300 MG capsule TAKE 1 CAPSULE(300 MG) BY MOUTH EVERY 12 HOURS   sacubitril-valsartan (ENTRESTO) 24-26 MG Take 1 tablet by mouth 2 (two) times daily.   spironolactone (ALDACTONE) 25 MG tablet Take 0.5 tablets (12.5 mg total) by mouth daily. K+ sparing diuretic: Hyperkalemia may occur with decreased renal function   sucralfate (CARAFATE) 1 g tablet Take 1 tablet (1 g total) by mouth 3 (three) times daily with meals as needed (for worsening epigastric pain/indigestion after eating).   [DISCONTINUED] ondansetron (ZOFRAN) 4 MG tablet Take 1 tablet (4 mg total) by mouth every 6  (six) hours as needed for nausea.     Allergies:   Sulfa antibiotics   Social History   Tobacco Use   Smoking status: Never Smoker   Smokeless tobacco: Never Used   Tobacco comment: "smoked as a teenager; for maybe 1 wk"  Substance Use Topics   Alcohol use: Yes    Comment: 6/15//2018 "glass of wine q 6 months or so"   Drug use: No     Family Hx: The patient's family history includes Arrhythmia in her mother; Heart attack in her father, maternal grandfather, and paternal grandfather; Heart disease in her father; Hyperlipidemia in her mother.  ROS:   Please see the history of present illness.    All other systems reviewed and are negative.  Labs/Other Tests and Data Reviewed:    EKG:  No ECG reviewed.  Recent Labs: 12/23/2017: Hemoglobin 12.3; Platelets 163 03/23/2018: ALT 12; BUN 22; Creatinine, Ser 1.06; NT-Pro BNP 165; Potassium 5.1; Sodium 143; TSH 24.970   Recent Lipid Panel Lab Results  Component Value Date/Time   CHOL 171 05/06/2017 03:51 PM   TRIG 127 05/06/2017 03:51 PM   HDL 43 05/06/2017 03:51 PM   CHOLHDL 4.0 05/06/2017 03:51 PM   LDLCALC 103 (H) 05/06/2017 03:51 PM   Wt Readings from Last 3 Encounters:  05/27/18 159 lb (72.1 kg)  03/23/18 165 lb 3.2 oz (74.9 kg)  02/03/18 163 lb 12.8 oz (74.3 kg)    Objective:    Vital Signs:  BP 131/74    Pulse 65    Ht 4\' 10"  (1.473 m)    Wt 159 lb (72.1 kg)    LMP  (LMP Unknown)    BMI 33.23 kg/m    VITAL SIGNS:  reviewed    ASSESSMENT & PLAN:    1.  Acute on chronic chronic systolic HF: She appears euvolemic.  At the last visit in December her creatinine was 1.1, BNP 130, her weight is down from 165 pounds to 159 pounds.  Her symptoms are stable NYHA class IIb-III.  She reports some worsening dyspnea when walking up stairs, we will bring her back in 2 months and obtain BNP and see MP at that time as well as her thyroid function, she is encouraged to continue walking daily.  2.  Nonischemic cardiomyopathy -  LVEF 35 to 40% on the most recent echocardiogram from January 2020, initially improvement to 50 to 55% with a BiV ICD, however her device get infected had to be removed  she received another device in January 2019, post explantation her LVEF dropped to 30 to 35% now improved to 35 to 40%.  3. BiV/ICD: history outline above in HPI. Required extraction due to bacteremia/ lead vegetation. Underwent re-implantation 01/2017.  Followed by Dr. Graciela HusbandsKlein. Functionally she seemed to be doing quite well following CRT implantation.  QRS is considerably narrowed.    4.  Hypothyroidism, followed by her primary care physician, we will repeat labs at the next visit.  COVID-19 Education: The signs and symptoms of COVID-19 were discussed with the patient and how to seek care for testing (follow up with PCP or arrange E-visit).  The importance of social distancing was discussed today.  Time:   Today, I have spent 25 minutes with the patient with telehealth technology discussing the above problems.    Medication Adjustments/Labs and Tests Ordered: Current medicines are reviewed at length with the patient today.  Concerns regarding medicines are outlined above.   Tests Ordered: No orders of the defined types were placed in this encounter.   Medication Changes: No orders of the defined types were placed in this encounter.   Disposition:  Follow up in 4 month(s)  Signed, Tobias AlexanderKatarina Krina Mraz, MD  05/27/2018 12:23 PM    [facility]

## 2018-05-27 NOTE — Telephone Encounter (Signed)
-----   Message from Marcy Siren, [place] sent at 05/27/2018  2:43 PM EDT ----- Regarding: RE: WIFE AND HUSBAND REFER FOR FOOD DELIVERY SERVICE PROGRAM They are both enrolled in the [employer] program. Thanks for the referral. Lasandra Beech ----- Message ----- From: Loa Socks, LPN Sent: [id number]/63/7858   1:41 PM EDT To: Marcy Siren, LCSW Subject: WIFE AND HUSBAND REFER FOR FOOD DELIVERY SER#  Dr. Delton See saw this pt and her husband Jillane Lemasters MRN [id number] this morning as virtuals.  They have no children that live in the state and are very high risk cardiac pts. Dr Delton See wants to get them assistance with your food delivery service program.  Can you please help with this?   Thanks for all you help,   Lajoyce Corners

## 2018-05-27 NOTE — Telephone Encounter (Signed)
CSW referred to contact patient to inquire about food insecurity during this health crisis. Patient reported need and agreeable to weekly delivery from [employer]. Patient informed that delivery will be left on front door with no face to face contact with delivery person. Patient agreeable to plan and grateful for the assistance. Jackie Shuntay Everetts, LCSW, CCSW-MCS [phone]  

## 2018-05-27 NOTE — Patient Instructions (Signed)
Medication Instructions:   Your physician recommends that you continue on your current medications as directed. Please refer to the Current Medication list given to you today.  If you need a refill on your cardiac medications before your next appointment, please call your pharmacy.   Lab work:  We will plan to draw your labs in clinic on July 20, 2018 when you come into the office to see Dr Delton See at 8 am--we will order in clinic for you to have drawn cmet, cbc w diff, tsh, pro-bnp, free T4.    If you have labs (blood work) drawn today and your tests are completely normal, you will receive your results only by: [name] MyChart Message (if you have MyChart) OR . A paper copy in the mail If you have any lab test that is abnormal or we need to change your treatment, we will call you to review the results.    Follow-Up:  WITH DR Delton See AS A REGULAR OFFICE VISIT ON July 20, 2018 AT 8 AM    Any Other Special Instructions Will Be Listed Below (If Applicable).  *WE HAVE REFERRED YOU TO A SOCIAL WORKER TO HELP SET YOU UP WITH OUR FOOD DELIVERY SERVICE PROGRAM*

## 2018-05-28 ENCOUNTER — Other Ambulatory Visit: Payer: Self-pay | Admitting: Infectious Disease

## 2018-05-28 ENCOUNTER — Telehealth: Payer: Self-pay | Admitting: Licensed Clinical Social Worker

## 2018-05-28 DIAGNOSIS — T827XXA Infection and inflammatory reaction due to other cardiac and vascular devices, implants and grafts, initial encounter: Secondary | ICD-10-CM

## 2018-05-28 DIAGNOSIS — T8450XS Infection and inflammatory reaction due to unspecified internal joint prosthesis, sequela: Secondary | ICD-10-CM

## 2018-05-28 NOTE — Telephone Encounter (Signed)
CSW contacted patient to follow up on weekly food delivery package. Patient informed of no face to face contact during delivery. Patient verbalizes understanding and grateful for the assistance.  CSW continues to follow for supportive needs. Jackie Cully Luckow, LCSW, CCSW-MCS [phone] 

## 2018-05-29 ENCOUNTER — Other Ambulatory Visit: Payer: Self-pay | Admitting: *Deleted

## 2018-05-29 DIAGNOSIS — T827XXA Infection and inflammatory reaction due to other cardiac and vascular devices, implants and grafts, initial encounter: Secondary | ICD-10-CM

## 2018-05-29 DIAGNOSIS — T8450XS Infection and inflammatory reaction due to unspecified internal joint prosthesis, sequela: Secondary | ICD-10-CM

## 2018-05-29 DIAGNOSIS — T8453XD Infection and inflammatory reaction due to internal right knee prosthesis, subsequent encounter: Secondary | ICD-10-CM

## 2018-05-29 MED ORDER — CEPHALEXIN 500 MG PO CAPS: 1000.0000 mg | ORAL_CAPSULE | Freq: Two times a day (BID) | ORAL | 3 refills | Status: DC

## 2018-05-29 MED ORDER — RIFAMPIN 300 MG PO CAPS: ORAL_CAPSULE | ORAL | 3 refills | Status: DC

## 2018-06-04 ENCOUNTER — Other Ambulatory Visit: Payer: Self-pay | Admitting: Cardiology

## 2018-06-08 ENCOUNTER — Telehealth: Payer: Self-pay | Admitting: Licensed Clinical Social Worker

## 2018-06-08 NOTE — Telephone Encounter (Signed)
CSW contacted patient to follow up on weekly food delivery package. Patient informed of no face to face contact during delivery. CSW discussed transition option for food delivery as the Covid 19 Food relief program will be ending on June 19, 2018. Patient verbalizes understanding and grateful for the assistance.  CSW continues to follow for supportive needs. Jackie Kaithlyn Teagle, LCSW, CCSW-MCS [phone] 

## 2018-06-15 ENCOUNTER — Telehealth: Payer: Self-pay | Admitting: Licensed Clinical Social Worker

## 2018-06-15 NOTE — Telephone Encounter (Signed)
  CSW contacted patient to follow up on weekly food delivery package. Patient informed of no face to face contact during delivery. CSW discussed transition option for food delivery as the Covid 19 Food relief program will be ending on June 17, 2018. Patient verbalizes understanding and grateful for the assistance.  CSW continues to follow for supportive needs. Jackie Zurisadai Helminiak, LCSW, CCSW-MCS [phone]  

## 2018-07-09 ENCOUNTER — Other Ambulatory Visit: Payer: Self-pay | Admitting: Cardiology

## 2018-07-15 ENCOUNTER — Telehealth: Payer: Self-pay | Admitting: *Deleted

## 2018-07-15 NOTE — Telephone Encounter (Signed)
Spoke with patient in regard to her and her husbands in office visit scheduled for Monday 07/20/18 and she prefers to change to virtual visits at this time. She is aware that I will call her on that day prior to her appointment to go over vital signs and medications for her and her husband. Consent already on file from previous virtual visits.

## 2018-07-15 NOTE — Telephone Encounter (Deleted)
Attempted to reach patient but she did not answer and voicemail is not set up. Will wait for her to call back and/or try again later.     [name]    COVID-19 Pre-Screening Questions:  . In the past 7 to 10 days have you had a cough,  shortness of breath, headache, congestion, fever (100 or greater) body aches, chills, sore throat, or sudden loss of taste or sense of smell? . Have you been around anyone with known Covid 19. . Have you been around anyone who is awaiting Covid 19 test results in the past 7 to 10 days? . Have you been around anyone who has been exposed to Covid 19, or has mentioned symptoms of Covid 19 within the past 7 to 10 days?  If you have any concerns/questions about symptoms patients report during screening (either on the phone or at threshold). Contact the provider seeing the patient or DOD for further guidance.  If neither are available contact a member of the leadership team.

## 2018-07-20 ENCOUNTER — Ambulatory Visit: Payer: Medicare Other | Admitting: Infectious Disease

## 2018-07-20 ENCOUNTER — Encounter: Payer: Self-pay | Admitting: Cardiology

## 2018-07-20 ENCOUNTER — Telehealth (INDEPENDENT_AMBULATORY_CARE_PROVIDER_SITE_OTHER): Payer: Medicare Other | Admitting: Cardiology

## 2018-07-20 ENCOUNTER — Other Ambulatory Visit: Payer: Self-pay

## 2018-07-20 VITALS — BP 106/68 | HR 62 | Ht <= 58 in | Wt 157.0 lb

## 2018-07-20 DIAGNOSIS — Z95810 Presence of automatic (implantable) cardiac defibrillator: Secondary | ICD-10-CM

## 2018-07-20 DIAGNOSIS — I428 Other cardiomyopathies: Secondary | ICD-10-CM | POA: Diagnosis not present

## 2018-07-20 DIAGNOSIS — E039 Hypothyroidism, unspecified: Secondary | ICD-10-CM

## 2018-07-20 DIAGNOSIS — I5022 Chronic systolic (congestive) heart failure: Secondary | ICD-10-CM

## 2018-07-20 NOTE — Progress Notes (Signed)
Virtual Visit via Telephone Note   This visit type was conducted due to national recommendations for restrictions regarding the COVID-19 Pandemic (e.g. social distancing) in an effort to limit this patient's exposure and mitigate transmission in our community.  Due to her co-morbid illnesses, this patient is at least at moderate risk for complications without adequate follow up.  This format is felt to be most appropriate for this patient at this time.  The patient did not have access to video technology/had technical difficulties with video requiring transitioning to audio format only (telephone).  All issues noted in this document were discussed and addressed.  No physical exam could be performed with this format.  Please refer to the patient's chart for her  consent to telehealth for [facility].   Date:  07/20/2018   ID:  [name] RamusAvonelle C Angelino, DOB 04/18/1935, MRN [id number]  Patient Location: Home Provider Location: Home  PCP:  Kaleen MaskElkins, Wilson Oliver, MD  Cardiologist:  Tobias AlexanderKatarina Montrell Cessna, MD  Electrophysiologist: Dr Graciela HusbandsKlein  Evaluation Performed:  Follow-Up Visit  Reason for visit: 2 months follow-up  History of Present Illness:    [name] Ramusvonelle C [name] is a 83 y.o. female with h/o nonischemic cardiomyopathy, left bundle branch block, nonsustained ventricular tachycardia, HTN and pancreatitis.   She underwent CRT Biotronik implantation  06/2014.  06/2016, she was  hospitalized and found to have staph bacteremia of (MSSA) in the wake of orthopedic surgery.  [facility] was reportedly negative. She was treated with  6 weeks of antibiotics  She developed recurrent 'septic arthritis" and bacteremia and TEE confirmed Vegetation on RA lead. [facility] was [facility] for extraction which she tolerated well.   Rx with 8 weeks of IV Abx, most recently 10/18 keflex and rifampin.  Because of worsening left ventricular function and worsening functional status, she underwent CRT reimplantation  01/2017, aware that she would need long-term chronic suppressive antibiotics. Per. Dr. Odessa FlemingKlein's recent OV note, she has been considerably better following CRT implantation.  There is less shortness of breath and more energy.  Repeat echo has not yet been done.  03/23/2018 -this is 3 months follow-up, the patient has stable symptoms class IIb-III, she is now more independent as she needs to help her husband, still gets short of breath after short distances at home but is able to perform activities of daily living with some interruptions.  She has been compliant to medications.  Her primary care physician Dr Jeannetta NapElkins changed Synthroid that we prescribed to a different medication whose name she does not remember.  She denies any lower extremity edema orthopnea proximal nocturnal dyspnea.  05/27/2018 - 2 months follow up, she and her husband has been staying at home mostly, she has been trying to walk 20 minutes a day outside and do leg exercises, she has lost 6 pounds, she is able to walk outside and her shortness of breath gets better with walking however she has noticed worsening dyspnea when on incline or when walking stairs.  She denies any palpitation dizziness or falls.  She has been compliant with her medications and has no side effects.  07/20/2018 -this is 2 months follow-up, the patient states that she feels very well, she has no lower extremity edema and only occasional paroxysmal nocturnal dyspnea.  She walks outside in her yard every day, she has no worsening shortness of breath, she actually feels best in a long time.  She denies any palpitations dizziness or syncope.  Her blood pressure is always  in low 100s but it is asymptomatic.  Prior CV studies:   The following studies were reviewed today:  08/14/16: TEE Study Conclusions - Left ventricle: Systolic function was normal. The estimated ejection fraction was in the range of 55% to 60%. Wall motion was normal; there were no regional wall motion  abnormalities. - Aortic valve: No evidence of vegetation. There was mild regurgitation. - Aorta: There was mild non-mobile atheroma. - Mitral valve: There was mild regurgitation. - Left atrium: No evidence of thrombus in the atrial cavity or appendage. No evidence of thrombus in the atrial cavity or appendage. - Right atrium: No evidence of thrombus in the atrial cavity or appendage. No evidence of thrombus in the atrial cavity or appendage. - Tricuspid valve: No evidence of vegetation. There was mild regurgitation. - Pulmonic valve: No evidence of vegetation. Impressions: - There is a highly mobile echodensity measuring 13 x 3 mm attached to the right atrial lead in the right atrium at the tricuspid valve level. This is consistent with pacemaker endocarditis.  10/17/15: limited echo for AV optimization Impressions: - The study is performed for AV optimization There is grade 1 diastolic dysfunction The optimal AV delay seems to be around 90-100 ms but i would defer to the doctors managing the pacer.  02/01/15: TTE Study Conclusions - Left ventricle: The cavity size was normal. There was moderate concentric hypertrophy. Systolic function was normal. The estimated ejection fraction was in the range of 50% to 55%. Wall motion was normal; there were no regional wall motion abnormalities. Doppler parameters are consistent with abnormal left ventricular relaxation (grade 1 diastolic dysfunction). The E/e&' ratio is >15, suggesting elevated LV filling pressure. - Mitral valve: Calcified annulus. Mildly thickened leaflets . There was trivial regurgitation. - Left atrium: The atrium was normal in size. - Right ventricle: The cavity size was normal. Wall thickness was normal. Pacer wire or catheter noted in right ventricle. Systolic function was normal. - Right atrium: The atrium was normal in size. Pacer wire or catheter noted in right  atrium. - Tricuspid valve: There was moderate regurgitation. - Pulmonary arteries: PA peak pressure: 30 mm Hg (S). - Inferior vena cava: The vessel was normal in size. The respirophasic diameter changes were in the normal range (= 50%), consistent with normal central venous pressure. Impressions: - Compared to a prior study in 09/2014, the EF has improved to 50-55%.  The patient does not have symptoms concerning for COVID-19 infection (fever, chills, cough, or new shortness of breath).    Past Medical History:  Diagnosis Date   Acrodermatitis continua of [place]    "was in remission for 5 years the 1st time; ~ 7 years the second time" (06/21/2016)   Acute lower GI bleeding 05/06/2017   Acute lower GI bleeding 05/06/2017   Anemia 05/06/2017   Arthritis    "finger joints" (06/21/2016)   Asthma    CHF (congestive heart failure) (HCC)    Congestive dilated cardiomyopathy (HCC) 03/31/2014   a. s/p Biotronik CRTD 06/2014   GERD (gastroesophageal reflux disease)    Headache    History of blood transfusion 1990s   "related to hysterectomy"   Hypothyroidism    Left bundle branch block    MSSA bacteremia 06/21/2016   Myocardial infarction (HCC)    Pancreatitis, acute    Past Surgical History:  Procedure Laterality Date   ABDOMINAL HYSTERECTOMY     APPENDECTOMY     BIV PACEMAKER INSERTION CRT-P N/A 01/17/2017   Procedure: BIV PACEMAKER  INSERTION CRT-P;  Surgeon: Duke Salvia, MD;  Location: [facility] INVASIVE CV LAB;  Service: Cardiovascular;  Laterality: N/A;   CARDIAC CATHETERIZATION     CARDIAC DEFIBRILLATOR PLACEMENT  ?2016   CATARACT EXTRACTION W/ INTRAOCULAR LENS  IMPLANT, BILATERAL Bilateral    CHOLECYSTECTOMY OPEN     "related to pancreatitis"   DILATION AND CURETTAGE OF UTERUS     EP IMPLANTABLE DEVICE N/A 07/04/2014   Procedure: BiV ICD Insertion CRT-D;  Surgeon: Duke Salvia, MD;  Location: [facility] INVASIVE CV LAB;  Service: Cardiovascular;   Laterality: N/A;   ESOPHAGOGASTRODUODENOSCOPY (EGD) WITH PROPOFOL N/A 05/08/2017   Procedure: ESOPHAGOGASTRODUODENOSCOPY (EGD) WITH PROPOFOL;  Surgeon: Graylin Shiver, MD;  Location: [facility] ENDOSCOPY;  Service: Endoscopy;  Laterality: N/A;   HERNIA REPAIR     I&D KNEE WITH POLY EXCHANGE Right 08/11/2016   Procedure: IRRIGATION AND DEBRIDEMENT KNEE WITH POLY EXCHANGE;  Surgeon: Samson Frederic, MD;  Location: [facility] OR;  Service: Orthopedics;  Laterality: Right;   INSERT / REPLACE / REMOVE PACEMAKER  ?2016   JOINT REPLACEMENT Bilateral    "thumb joints; used my own material"   JOINT REPLACEMENT     LEFT HEART CATHETERIZATION WITH CORONARY ANGIOGRAM N/A 11/02/2013   Procedure: LEFT HEART CATHETERIZATION WITH CORONARY ANGIOGRAM;  Surgeon: Corky Crafts, MD;  Location: [facility] CATH LAB;  Service: Cardiovascular;  Laterality: N/A;   LOOP RECORDER IMPLANT N/A 04/13/2014   Procedure: LOOP RECORDER IMPLANT;  Surgeon: Duke Salvia, MD;  Location: [facility] CATH LAB;  Service: Cardiovascular;  Laterality: N/A;   LOOP RECORDER REMOVAL  ?2016   POSTERIOR LUMBAR FUSION  05/03/2016   Lumbar Two-Three, Lumbar Three-Four Posterior Lumbar Fusion withLaminectomy and Foraminotomy   SPINE SURGERY  04/2016   lumbar    TEE WITHOUT CARDIOVERSION N/A 08/14/2016   Procedure: TRANSESOPHAGEAL ECHOCARDIOGRAM (TEE);  Surgeon: Lars Masson, MD;  Location: [facility] ENDOSCOPY;  Service: Cardiovascular;  Laterality: N/A;   TONSILLECTOMY     TOTAL HIP ARTHROPLASTY Bilateral    UMBILICAL HERNIA REPAIR  1970s?     Current Meds  Medication Sig   albuterol (PROAIR HFA) 108 (90 Base) MCG/ACT inhaler Inhale 2 puffs into the lungs every 6 (six) hours as needed for wheezing or shortness of breath.   betamethasone dipropionate (DIPROLENE) 0.05 % ointment Apply 1 application topically daily as needed.   Calcium Carbonate Antacid (TUMS PO) Take 1 tablet by mouth daily as needed (indigestion).    carvedilol (COREG) 12.5 MG tablet  TAKE 1 TABLET TWICE A DAY   cephALEXin (KEFLEX) 500 MG capsule Take 2 capsules (1,000 mg total) by mouth 2 (two) times daily.   feeding supplement, ENSURE ENLIVE, (ENSURE ENLIVE) LIQD Take 237 mLs by mouth 2 (two) times daily between meals.   Fluticasone-Salmeterol (ADVAIR DISKUS) 250-50 MCG/DOSE AEPB Inhale 1 puff into the lungs 2 (two) times daily.   furosemide (LASIX) 20 MG tablet Take 1 tablet (20 mg total) by mouth daily as needed for edema.   levothyroxine (SYNTHROID) 112 MCG tablet Take 112 mcg by mouth daily before breakfast. 2 on Sunday   Melatonin 3 MG TABS Take 3 mg by mouth at bedtime as needed (for sleep.).    menthol-cetylpyridinium (CEPACOL) 3 MG lozenge Take 1 lozenge (3 mg total) by mouth as needed for sore throat (sore throat).   methocarbamol (ROBAXIN) 500 MG tablet Take 1 tablet (500 mg total) by mouth every 6 (six) hours as needed for muscle spasms.   methylphenidate (RITALIN) 20 MG tablet Take 40  mg by mouth daily.    Naphazoline-Pheniramine (ALLERGY EYE OP) Place 1-2 drops into both eyes daily as needed (for irritated eyes).   naproxen (NAPROSYN) 500 MG tablet Take 500 mg by mouth 2 (two) times daily as needed.    nitroGLYCERIN (NITROSTAT) 0.4 MG SL tablet Place 1 tablet (0.4 mg total) every 5 (five) minutes as needed under the tongue for chest pain.   ondansetron (ZOFRAN) 8 MG tablet Take 8 mg by mouth as needed.   polyethylene glycol (MIRALAX / GLYCOLAX) packet Take 17 g by mouth daily as needed for mild constipation.   rifampin (RIFADIN) 300 MG capsule TAKE 1 CAPSULE(300 MG) BY MOUTH EVERY 12 HOURS   sacubitril-valsartan (ENTRESTO) 24-26 MG Take 1 tablet by mouth 2 (two) times daily.   spironolactone (ALDACTONE) 25 MG tablet Take 0.5 tablets (12.5 mg total) by mouth daily. K+ sparing diuretic: Hyperkalemia may occur with decreased renal function   sucralfate (CARAFATE) 1 g tablet Take 1 tablet (1 g total) by mouth 3 (three) times daily with meals as  needed (for worsening epigastric pain/indigestion after eating).     Allergies:   Sulfa antibiotics   Social History   Tobacco Use   Smoking status: Never Smoker   Smokeless tobacco: Never Used   Tobacco comment: "smoked as a teenager; for maybe 1 wk"  Substance Use Topics   Alcohol use: Yes    Comment: 6/15//2018 "glass of wine q 6 months or so"   Drug use: No     Family Hx: The patient's family history includes Arrhythmia in her mother; Heart attack in her father, maternal grandfather, and paternal grandfather; Heart disease in her father; Hyperlipidemia in her mother.  ROS:   Please see the history of present illness.    All other systems reviewed and are negative.  Labs/Other Tests and Data Reviewed:    EKG:  No ECG reviewed.  Recent Labs: 12/23/2017: Hemoglobin 12.3; Platelets 163 03/23/2018: ALT 12; BUN 22; Creatinine, Ser 1.06; NT-Pro BNP 165; Potassium 5.1; Sodium 143; TSH 24.970   Recent Lipid Panel Lab Results  Component Value Date/Time   CHOL 171 05/06/2017 03:51 PM   TRIG 127 05/06/2017 03:51 PM   HDL 43 05/06/2017 03:51 PM   CHOLHDL 4.0 05/06/2017 03:51 PM   LDLCALC 103 (H) 05/06/2017 03:51 PM   Wt Readings from Last 3 Encounters:  07/20/18 157 lb (71.2 kg)  05/27/18 159 lb (72.1 kg)  03/23/18 165 lb 3.2 oz (74.9 kg)    Objective:    Vital Signs:  BP 106/68    Pulse 62    Ht 4\' 10"  (1.473 m)    Wt 157 lb (71.2 kg)    LMP  (LMP Unknown)    BMI 32.81 kg/m    VITAL SIGNS:  reviewed     ASSESSMENT & PLAN:    1.  Acute on chronic chronic systolic HF:  -She seems to be euvolemic, weight further down now 157 pounds.  Will continue same management.  Blood pressure rather low but she is asymptomatic. We will obtain CMP and thyroid function tests prior to the next visit.  2.  Nonischemic cardiomyopathy - LVEF 35 to 40% on the most recent echocardiogram from January 2020, initially improvement to 50 to 55% with a BiV ICD, however her device get  infected had to be removed she received another device in January 2019, post explantation her LVEF dropped to 30 to 35% now improved to 35 to 40%.  3. BiV/ICD: history outline  above in HPI. Required extraction due to bacteremia/ lead vegetation. Underwent re-implantation 01/2017.  Followed by Dr. Graciela HusbandsKlein. Functionally she seemed to be doing quite well following CRT implantation.  QRS is considerably narrowed.    4.  Hypothyroidism, followed by her primary care physician, we will repeat labs at the next visit.  COVID-19 Education: The signs and symptoms of COVID-19 were discussed with the patient and how to seek care for testing (follow up with PCP or arrange E-visit).  The importance of social distancing was discussed today.  Time:   Today, I have spent 25 minutes with the patient with telehealth technology discussing the above problems.    Medication Adjustments/Labs and Tests Ordered: Current medicines are reviewed at length with the patient today.  Concerns regarding medicines are outlined above.   Tests Ordered: No orders of the defined types were placed in this encounter.   Medication Changes: No orders of the defined types were placed in this encounter.   Disposition:  Follow up in 4 month(s)  Signed, Tobias AlexanderKatarina Francyne Arreaga, MD  07/20/2018 8:31 AM    [facility]

## 2018-07-20 NOTE — Patient Instructions (Signed)
Medication Instructions:   Your physician recommends that you continue on your current medications as directed. Please refer to the Current Medication list given to you today.  If you need a refill on your cardiac medications before your next appointment, please call your pharmacy.     Lab work:  SAME DAY AS YOUR OFFICE VISIT APPOINTMENT WITH DR Meda Coffee ON 11/02/18--YOUR LAB APPOINTMENT IS BEFORE YOUR APPOINTMENT WITH DR NELSON AT 8 AM--WE WILL CHECK CMET, TSH, FREE T3, AND FREE T4  If you have labs (blood work) drawn today and your tests are completely normal, you will receive your results only by: [name] MyChart Message (if you have MyChart) OR . A paper copy in the mail If you have any lab test that is abnormal or we need to change your treatment, we will call you to review the results.    Follow-Up:  3 MONTHS WITH DR Meda Coffee ON November 02, 2018 AT 8 AM--THIS IS A REGULAR OFFICE VISIT--PLEASE COME IN A FEW MINUTES PRIOR TO THIS APPOINTMENT TO HAVE YOUR LABS DONE

## 2018-07-21 ENCOUNTER — Ambulatory Visit (INDEPENDENT_AMBULATORY_CARE_PROVIDER_SITE_OTHER): Payer: Medicare Other | Admitting: Infectious Disease

## 2018-07-21 ENCOUNTER — Other Ambulatory Visit: Payer: Self-pay

## 2018-07-21 DIAGNOSIS — T8453XD Infection and inflammatory reaction due to internal right knee prosthesis, subsequent encounter: Secondary | ICD-10-CM

## 2018-07-21 DIAGNOSIS — T8450XS Infection and inflammatory reaction due to unspecified internal joint prosthesis, sequela: Secondary | ICD-10-CM

## 2018-07-21 DIAGNOSIS — T827XXA Infection and inflammatory reaction due to other cardiac and vascular devices, implants and grafts, initial encounter: Secondary | ICD-10-CM | POA: Diagnosis not present

## 2018-07-21 MED ORDER — CEPHALEXIN 500 MG PO CAPS: 1000.0000 mg | ORAL_CAPSULE | Freq: Two times a day (BID) | ORAL | 12 refills | Status: DC

## 2018-07-21 MED ORDER — RIFAMPIN 300 MG PO CAPS: ORAL_CAPSULE | ORAL | 12 refills | Status: DC

## 2018-07-21 NOTE — Progress Notes (Signed)
Virtual Visit via Telephone Note  I connected with [name] [name] on 07/21/18 at  9:45 AM EDT by telephone and verified that I am speaking with the correct person using two identifiers.  Location: Patient: Home Provider: [facility]   I discussed the limitations, risks, security and privacy concerns of performing an evaluation and management service by telephone and the availability of in person appointments. I also discussed with the patient that there may be a patient responsible charge related to this service. The patient expressed understanding and agreed to proceed.   History of Present Illness:  83 year old who underwent lumbar laminectomy by Dr. Yetta Barre on 05/03/16. Presented to [facility] 6/7 with fevers, weakness and diffuse pain. She developed sepsis/hypotension and was found to have MSSA bacteremia and transferred to [facility] 6/15 for continued care and assessment by EP team considering she has cardiac device. At [facility] she underwent evaluation for metastatic sites of infection including CT of spine, TTE/TEE and BCx monitoring. All repeat BCx were negative once transferred here. TTE/TEE were both negative for lead or valvular endocarditis at that time; received 4weeks IV Ancef and decision to leave cardiac device.  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 She underwent single staged exchange arthroplasty on right knee by Dr. Veda Canning. Dr. Ladona Ridgel was not available for device extraction here so she was transferred to [facility] where she had cardiac device extracted on August 10th. She had blood cultures and device cultures done with no growth. PICC inserted on 08/18/16. She was sent out on IV cefazolin 2g IV q 8 but also IV rifampin 300 mg IV q 12 at SNF and then home. Managing FIVE infusions was overwhelming for her husband. I  changed her to rifampin 300mg  orally BID and then Ceftriaxone 2gram IV push to complete 42 days of therapy post extraction. Initially her cardiac function had mproved quite nicely. We placed her on high dose keflex and bid rifampin and she has been on these meds without fail since I last saw her. She is approaching in December 4 months of systemic antimicrobials since extraction of her device and a litle more since I and D and exchange of liner of prosthesis.   Her EF had worsened and EP and Cardiology after careful consideration we implanted a biventricular pacemaker on January 17, 2017.  She continued on her cephalexin and rifampin.   I last saw her in January 2020 and she was doing well at that time as well.  Today we connected through telephone for an E visit.  She tells me that she did have a bout of 1 day where her knee began to hurt more than usual and that day she took an extra dose of Keflex.  Her symptoms improved by the following day.  Her knee is now with improvement and nearly no pain by her account.  She tolerates her Keflex and rifampin without difficulties.  Her liver function tests were normal when they were checked this spring time.  She is largely sheltering in place living in her home with her husband and going for walks on their large multi-acre property.  She does not go out unless she has to for essentials and always wears a mask if she does so.  Apparently 1 of her daughters is working as a Engineer, civil (consulting) who is volunteering and traveling to [place] to help with  the coronavirus epidemic there.     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 The patient was provided an opportunity to ask questions and all were answered. The patient agreed with the plan and demonstrated an understanding of the instructions.   The patient was advised to call back or seek an in-person evaluation if the symptoms worsen or if the condition fails to improve as anticipated.  I provided 21 minutes of non-face-to-face time during this encounter.   Alcide Evener, MD

## 2018-08-04 ENCOUNTER — Ambulatory Visit (INDEPENDENT_AMBULATORY_CARE_PROVIDER_SITE_OTHER): Payer: Medicare Other | Admitting: *Deleted

## 2018-08-04 DIAGNOSIS — I428 Other cardiomyopathies: Secondary | ICD-10-CM

## 2018-08-04 LAB — CUP PACEART REMOTE DEVICE CHECK
Battery Remaining Longevity: 104 mo
Battery Voltage: 3 V
Brady Statistic AP VP Percent: 17.42 %
Brady Statistic AP VS Percent: 0.24 %
Brady Statistic AS VP Percent: 79.84 %
Brady Statistic AS VS Percent: 2.5 %
Brady Statistic RA Percent Paced: 18.28 %
Brady Statistic RV Percent Paced: 18.52 %
Date Time Interrogation Session: 20200728050507
Implantable Lead Implant Date: 20190111
Implantable Lead Implant Date: 20190111
Implantable Lead Implant Date: 20190111
Implantable Lead Location: 753858
Implantable Lead Location: 753859
Implantable Lead Location: 753860
Implantable Lead Model: 5076
Implantable Lead Model: 5076
Implantable Pulse Generator Implant Date: 20190111
Lead Channel Impedance Value: 1387 Ohm
Lead Channel Impedance Value: 1444 Ohm
Lead Channel Impedance Value: 1482 Ohm
Lead Channel Impedance Value: 1520 Ohm
Lead Channel Impedance Value: 1520 Ohm
Lead Channel Impedance Value: 1577 Ohm
Lead Channel Impedance Value: 361 Ohm
Lead Channel Impedance Value: 361 Ohm
Lead Channel Impedance Value: 399 Ohm
Lead Channel Impedance Value: 437 Ohm
Lead Channel Impedance Value: 722 Ohm
Lead Channel Impedance Value: 779 Ohm
Lead Channel Impedance Value: 836 Ohm
Lead Channel Impedance Value: 855 Ohm
Lead Channel Pacing Threshold Amplitude: 0.5 V
Lead Channel Pacing Threshold Amplitude: 0.75 V
Lead Channel Pacing Threshold Amplitude: 2.125 V
Lead Channel Pacing Threshold Pulse Width: 0.4 ms
Lead Channel Pacing Threshold Pulse Width: 0.4 ms
Lead Channel Pacing Threshold Pulse Width: 1 ms
Lead Channel Sensing Intrinsic Amplitude: 1 mV
Lead Channel Sensing Intrinsic Amplitude: 1 mV
Lead Channel Sensing Intrinsic Amplitude: 10.75 mV
Lead Channel Sensing Intrinsic Amplitude: 10.75 mV
Lead Channel Setting Pacing Amplitude: 2 V
Lead Channel Setting Pacing Amplitude: 2.25 V
Lead Channel Setting Pacing Amplitude: 2.5 V
Lead Channel Setting Pacing Pulse Width: 0.4 ms
Lead Channel Setting Pacing Pulse Width: 1 ms
Lead Channel Setting Sensing Sensitivity: 0.9 mV

## 2018-08-17 ENCOUNTER — Encounter: Payer: Self-pay | Admitting: Cardiology

## 2018-08-17 NOTE — Progress Notes (Signed)
Remote pacemaker transmission.   

## 2018-09-14 ENCOUNTER — Other Ambulatory Visit: Payer: Self-pay | Admitting: Cardiology

## 2018-09-14 DIAGNOSIS — Z95810 Presence of automatic (implantable) cardiac defibrillator: Secondary | ICD-10-CM

## 2018-09-14 DIAGNOSIS — I5022 Chronic systolic (congestive) heart failure: Secondary | ICD-10-CM

## 2018-09-23 NOTE — Progress Notes (Signed)
Virtual Visit via Telephone Note   This visit type was conducted due to national recommendations for restrictions regarding the COVID-19 Pandemic (e.g. social distancing) in an effort to limit this patient's exposure and mitigate transmission in our community.  Due to her co-morbid illnesses, this patient is at least at moderate risk for complications without adequate follow up.  This format is felt to be most appropriate for this patient at this time.  The patient did not have access to video technology/had technical difficulties with video requiring transitioning to audio format only (telephone).  All issues noted in this document were discussed and addressed.  No physical exam could be performed with this format.  Please refer to the patient's chart for her  consent to telehealth for [facility].   Date:  09/25/2018   ID:  [name] [name], DOB 05/18/1935, MRN [id number]  Patient Location: Home Provider Location: Home  PCP:  Kaleen MaskElkins, Wilson Oliver, MD  Cardiologist:  Tobias AlexanderKatarina Biff Rutigliano, MD  Electrophysiologist: Dr Graciela HusbandsKlein  Evaluation Performed:  Follow-Up Visit  Reason for visit: 2 months follow-up  History of Present Illness:    [name] Ramusvonelle C Cryderman is a 83 y.o. female with h/o nonischemic cardiomyopathy, left bundle branch block, nonsustained ventricular tachycardia, HTN and pancreatitis.   She underwent CRT Biotronik implantation  06/2014.  06/2016, she was  hospitalized and found to have staph bacteremia of (MSSA) in the wake of orthopedic surgery.  [facility] was reportedly negative. She was treated with  6 weeks of antibiotics  She developed recurrent 'septic arthritis" and bacteremia and TEE confirmed Vegetation on RA lead. [facility] extraction which she tolerated well.   Rx with 8 weeks of IV Abx, most recently 10/18 keflex and rifampin.  Because of worsening left ventricular function and worsening functional status, she underwent CRT reimplantation  01/2017, aware that she would need long-term chronic suppressive antibiotics. Per. Dr. Odessa FlemingKlein's recent OV note, she has been considerably better following CRT implantation.  There is less shortness of breath and more energy.  Repeat echo has not yet been done.  03/23/2018 -this is 3 months follow-up, the patient has stable symptoms class IIb-III, she is now more independent as she needs to help her husband, still gets short of breath after short distances at home but is able to perform activities of daily living with some interruptions.  She has been compliant to medications.  Her primary care physician Dr Jeannetta NapElkins changed Synthroid that we prescribed to a different medication whose name she does not remember.  She denies any lower extremity edema orthopnea proximal nocturnal dyspnea.  05/27/2018 - 2 months follow up, she and her husband has been staying at home mostly, she has been trying to walk 20 minutes a day outside and do leg exercises, she has lost 6 pounds, she is able to walk outside and her shortness of breath gets better with walking however she has noticed worsening dyspnea when on incline or when walking stairs.  She denies any palpitation dizziness or falls.  She has been compliant with her medications and has no side effects.  07/20/2018 -this is 2 months follow-up, the patient states that she feels very well, she has no lower extremity edema and only occasional paroxysmal nocturnal dyspnea.  She walks outside in her yard every day, she has no worsening shortness of breath, she actually feels best in a long time.  She denies any palpitations dizziness or syncope.  Her blood pressure is always  in low 100s but it is asymptomatic.  09/25/2018 - 2 months follow up, no labs since 03/23/18, she has been feeling well, walks 2-3 x/day without chest pain and no significant DOE. She has a blood work done suspicious for hemochromatosis (polycytemia, also FH - father diagnosed and daughter borderline), she was  also diagnosed with CKD stage 4 and was taken off spironolactone. She feels well, no LE edema, orthopnea, PND.  Prior CV studies:   The following studies were reviewed today:  08/14/16: TEE Study Conclusions - Left ventricle: Systolic function was normal. The estimated ejection fraction was in the range of 55% to 60%. Wall motion was normal; there were no regional wall motion abnormalities. - Aortic valve: No evidence of vegetation. There was mild regurgitation. - Aorta: There was mild non-mobile atheroma. - Mitral valve: There was mild regurgitation. - Left atrium: No evidence of thrombus in the atrial cavity or appendage. No evidence of thrombus in the atrial cavity or appendage. - Right atrium: No evidence of thrombus in the atrial cavity or appendage. No evidence of thrombus in the atrial cavity or appendage. - Tricuspid valve: No evidence of vegetation. There was mild regurgitation. - Pulmonic valve: No evidence of vegetation. Impressions: - There is a highly mobile echodensity measuring 13 x 3 mm attached to the right atrial lead in the right atrium at the tricuspid valve level. This is consistent with pacemaker endocarditis.  10/17/15: limited echo for AV optimization Impressions: - The study is performed for AV optimization There is grade 1 diastolic dysfunction The optimal AV delay seems to be around 90-100 ms but i would defer to the doctors managing the pacer.  02/01/15: TTE Study Conclusions - Left ventricle: The cavity size was normal. There was moderate concentric hypertrophy. Systolic function was normal. The estimated ejection fraction was in the range of 50% to 55%. Wall motion was normal; there were no regional wall motion abnormalities. Doppler parameters are consistent with abnormal left ventricular relaxation (grade 1 diastolic dysfunction). The E/e&' ratio is >15, suggesting elevated LV filling pressure. - Mitral  valve: Calcified annulus. Mildly thickened leaflets . There was trivial regurgitation. - Left atrium: The atrium was normal in size. - Right ventricle: The cavity size was normal. Wall thickness was normal. Pacer wire or catheter noted in right ventricle. Systolic function was normal. - Right atrium: The atrium was normal in size. Pacer wire or catheter noted in right atrium. - Tricuspid valve: There was moderate regurgitation. - Pulmonary arteries: PA peak pressure: 30 mm Hg (S). - Inferior vena cava: The vessel was normal in size. The respirophasic diameter changes were in the normal range (= 50%), consistent with normal central venous pressure. Impressions: - Compared to a prior study in 09/2014, the EF has improved to 50-55%.  The patient does not have symptoms concerning for COVID-19 infection (fever, chills, cough, or new shortness of breath).    Past Medical History:  Diagnosis Date   Acrodermatitis continua of [place]    "was in remission for 5 years the 1st time; ~ 7 years the second time" (06/21/2016)   Acute lower GI bleeding 05/06/2017   Acute lower GI bleeding 05/06/2017   Anemia 05/06/2017   Arthritis    "finger joints" (06/21/2016)   Asthma    CHF (congestive heart failure) (HCC)    Congestive dilated cardiomyopathy (HCC) 03/31/2014   a. s/p Biotronik CRTD 06/2014   GERD (gastroesophageal reflux disease)    Headache    History of blood transfusion 1990s   "  related to hysterectomy"   Hypothyroidism    Left bundle branch block    MSSA bacteremia 06/21/2016   Myocardial infarction ([place])    Pancreatitis, acute    Past Surgical History:  Procedure Laterality Date   ABDOMINAL HYSTERECTOMY     APPENDECTOMY     BIV PACEMAKER INSERTION CRT-P N/A 01/17/2017   Procedure: BIV PACEMAKER INSERTION CRT-P;  Surgeon: Deboraha Sprang, MD;  Location: [place] [facility];  Service: Cardiovascular;  Laterality: N/A;   CARDIAC CATHETERIZATION       CARDIAC DEFIBRILLATOR PLACEMENT  ?2016   CATARACT EXTRACTION W/ INTRAOCULAR LENS  IMPLANT, BILATERAL Bilateral    CHOLECYSTECTOMY OPEN     "related to pancreatitis"   DILATION AND CURETTAGE OF UTERUS     EP IMPLANTABLE DEVICE N/A 07/04/2014   Procedure: BiV ICD Insertion CRT-D;  Surgeon: Deboraha Sprang, MD;  Location: [place] [facility];  Service: Cardiovascular;  Laterality: N/A;   ESOPHAGOGASTRODUODENOSCOPY (EGD) WITH PROPOFOL N/A 05/08/2017   Procedure: ESOPHAGOGASTRODUODENOSCOPY (EGD) WITH PROPOFOL;  Surgeon: Wonda Horner, MD;  [facility] ENDOSCOPY;  Service: Endoscopy;  Laterality: N/A;   HERNIA REPAIR     I&D KNEE WITH POLY EXCHANGE Right 08/11/2016   Procedure: IRRIGATION AND DEBRIDEMENT KNEE WITH POLY EXCHANGE;  Surgeon: Rod Can, MD;  Location: [place];  Service: Orthopedics;  Laterality: Right;   INSERT / REPLACE / REMOVE PACEMAKER  ?2016   JOINT REPLACEMENT Bilateral    "thumb joints; used my own material"   JOINT REPLACEMENT     LEFT HEART CATHETERIZATION WITH CORONARY ANGIOGRAM N/A 11/02/2013   Procedure: LEFT HEART CATHETERIZATION WITH CORONARY ANGIOGRAM;  Surgeon: Jettie Booze, MD;  Location: [facility] CATH LAB;  Service: Cardiovascular;  Laterality: N/A;   LOOP RECORDER IMPLANT N/A 04/13/2014   Procedure: LOOP RECORDER IMPLANT;  Surgeon: Deboraha Sprang, MD;  Location: [facility] CATH LAB;  Service: Cardiovascular;  Laterality: N/A;   LOOP RECORDER REMOVAL  ?2016   POSTERIOR LUMBAR FUSION  05/03/2016   Lumbar Two-Three, Lumbar Three-Four Posterior Lumbar Fusion withLaminectomy and Foraminotomy   SPINE SURGERY  04/2016   lumbar    TEE WITHOUT CARDIOVERSION N/A 08/14/2016   Procedure: TRANSESOPHAGEAL ECHOCARDIOGRAM (TEE);  Surgeon: Dorothy Spark, MD;  Location: [facility] ENDOSCOPY;  Service: Cardiovascular;  Laterality: N/A;   TONSILLECTOMY     TOTAL HIP ARTHROPLASTY Bilateral    UMBILICAL HERNIA REPAIR  1970s?     No outpatient medications have been marked  as taking for the 09/25/18 encounter (Telemedicine) with Dorothy Spark, MD.     Allergies:   Sulfa antibiotics   Social History   Tobacco Use   Smoking status: Never Smoker   Smokeless tobacco: Never Used   Tobacco comment: "smoked as a teenager; for maybe 1 wk"  Substance Use Topics   Alcohol use: Yes    Comment: 6/15//2018 "glass of wine q 6 months or so"   Drug use: No     Family Hx: The patient's family history includes Arrhythmia in her mother; Heart attack in her father, maternal grandfather, and paternal grandfather; Heart disease in her father; Hyperlipidemia in her mother.  ROS:   Please see the history of present illness.    All other systems reviewed and are negative.  Labs/Other Tests and Data Reviewed:    EKG:  No ECG reviewed.  Recent Labs: 12/23/2017: Hemoglobin 12.3; Platelets 163 03/23/2018: ALT 12; BUN 22; Creatinine, Ser 1.06; NT-Pro BNP 165; Potassium 5.1; Sodium 143; TSH 24.970  Recent Lipid Panel Lab Results  Component Value Date/Time   CHOL 171 05/06/2017 03:51 PM   TRIG 127 05/06/2017 03:51 PM   HDL 43 05/06/2017 03:51 PM   CHOLHDL 4.0 05/06/2017 03:51 PM   LDLCALC 103 (H) 05/06/2017 03:51 PM   Wt Readings from Last 3 Encounters:  09/25/18 161 lb (73 kg)  07/20/18 157 lb (71.2 kg)  05/27/18 159 lb (72.1 kg)    Objective:    Vital Signs:  BP 121/64    Pulse 69    Wt 161 lb (73 kg)    LMP  (LMP Unknown)    BMI 33.65 kg/m    VITAL SIGNS:  reviewed     ASSESSMENT & PLAN:    1.  Acute on chronic chronic systolic HF:  -weight slightly up, she is now off spironolactone - crea up per patient - we are awaiting results Sherryll Burger- Entresto on for at least a year and normal Crea in March 2020  2.  Nonischemic cardiomyopathy - LVEF 35 to 40% on the most recent echocardiogram from January 2020, initially improvement to 50 to 55% with a BiV ICD, however her device get infected had to be removed she received another device in January 2019, post  explantation her LVEF dropped to 30 to 35% now improved to 35 to 40%. - I will relook her MRI for possible signs of hemochromatosis  3. BiV/ICD: history outline above in HPI. Required extraction due to bacteremia/ lead vegetation. Underwent re-implantation 01/2017.  Followed by Dr. Graciela HusbandsKlein. Functionally she seemed to be doing quite well following CRT implantation.  QRS is considerably narrowed.    4.  Hypothyroidism, followed by her primary care physician, we will repeat labs at the next visit.  COVID-19 Education: The signs and symptoms of COVID-19 were discussed with the patient and how to seek care for testing (follow up with PCP or arrange E-visit).  The importance of social distancing was discussed today.  Time:   Today, I have spent 25 minutes with the patient with telehealth technology discussing the above problems.    Medication Adjustments/Labs and Tests Ordered: Current medicines are reviewed at length with the patient today.  Concerns regarding medicines are outlined above.   Tests Ordered: No orders of the defined types were placed in this encounter.   Medication Changes: No orders of the defined types were placed in this encounter.   Disposition:  Follow up in 4 month(s)  Signed, Tobias AlexanderKatarina Rafaella Kole, MD  09/25/2018 10:28 AM    [facility]

## 2018-09-24 ENCOUNTER — Telehealth: Payer: Self-pay

## 2018-09-24 NOTE — Telephone Encounter (Signed)
called and reviewed meds with pt for appt on 09/25/18

## 2018-09-25 ENCOUNTER — Telehealth (INDEPENDENT_AMBULATORY_CARE_PROVIDER_SITE_OTHER): Payer: Medicare Other | Admitting: Cardiology

## 2018-09-25 ENCOUNTER — Encounter: Payer: Self-pay | Admitting: Cardiology

## 2018-09-25 ENCOUNTER — Other Ambulatory Visit: Payer: Self-pay

## 2018-09-25 DIAGNOSIS — I428 Other cardiomyopathies: Secondary | ICD-10-CM | POA: Diagnosis not present

## 2018-09-25 DIAGNOSIS — Z95810 Presence of automatic (implantable) cardiac defibrillator: Secondary | ICD-10-CM

## 2018-09-25 DIAGNOSIS — T827XXA Infection and inflammatory reaction due to other cardiac and vascular devices, implants and grafts, initial encounter: Secondary | ICD-10-CM

## 2018-09-25 NOTE — Patient Instructions (Addendum)
Medication Instructions:   Your physician recommends that you continue on your current medications as directed. Please refer to the Current Medication list given to you today.  If you need a refill on your cardiac medications before your next appointment, please call your pharmacy.     Lab work:  AS SCHEDULED IN OUR OFFICE FOR 11/02/18-SAME DAY AS YOUR OFFICE VISIT APPOINTMENT WITH DR Meda Coffee.   WE WILL BE AWAITING THOSE RESULTS FROM YOUR PCP TO BE FAXED TO OUR OFFICE FOR DR NELSON TO REVIEW.  If you have labs (blood work) drawn today and your tests are completely normal, you will receive your results only by: [name] MyChart Message (if you have MyChart) OR . A paper copy in the mail If you have any lab test that is abnormal or we need to change your treatment, we will call you to review the results.    Follow-Up:  AS SCHEDULED IN THE OFFICE WITH DR. Meda Coffee ON 11/02/18 AT 8 AM--YOU WILL HAVE LABS DONE SAME DAY

## 2018-10-29 ENCOUNTER — Telehealth: Payer: Self-pay | Admitting: Cardiology

## 2018-10-29 NOTE — Telephone Encounter (Signed)
New Message     Patient would like to speak to a nurse about appointment.  States she wants to reschedule 11/02/18 appointment but wants to speak to a nurse to make sure it's okay to do so.  Please call back to advise.

## 2018-10-29 NOTE — Telephone Encounter (Signed)
Dr. Meda Coffee, pt and husband are scheduled to see you on Monday 10/26 in the office and prefer this to be switched to virtual due to driving issues and the pandemic.  They will both keep their appt with you on Monday 10/26 at 0800 and 0820, but will be a telephone virtual visit instead.  They send apologies to Dr. Meda Coffee and myself, but this is the safest option for them at this time.  Informed them both that this is no worries at all, and both appts were switched to virtual telephone visit with Dr Meda Coffee on 10/26.  Pt verbalized understanding and agrees with this plan. Both were more than gracious for all the assistance provided.

## 2018-10-30 NOTE — Progress Notes (Signed)
Virtual Visit via Telephone Note   This visit type was conducted due to national recommendations for restrictions regarding the COVID-19 Pandemic (e.g. social distancing) in an effort to limit this patient's exposure and mitigate transmission in our community.  Due to her co-morbid illnesses, this patient is at least at moderate risk for complications without adequate follow up.  This format is felt to be most appropriate for this patient at this time.  The patient did not have access to video technology/had technical difficulties with video requiring transitioning to audio format only (telephone).  All issues noted in this document were discussed and addressed.  No physical exam could be performed with this format.  Please refer to the patient's chart for her  consent to telehealth for [name].   Date:  11/02/2018   ID:  [name] [name], DOB 17-May-1935, MRN [id number]  Patient Location: Home Provider Location: Home  PCP:  [name], MD  Cardiologist:  Tobias Alexander, MD  Electrophysiologist: Dr Graciela Husbands  Evaluation Performed:  Follow-Up Visit  Reason for visit: 1 months follow-up  History of Present Illness:    [name] [name] is a 83 y.o. female with h/o nonischemic cardiomyopathy, left bundle branch block, nonsustained ventricular tachycardia, HTN and pancreatitis.   She underwent CRT Biotronik implantation  06/2014.  06/2016, she was  hospitalized and found to have staph bacteremia of (MSSA) in the wake of orthopedic surgery.  TEE at [name] was reportedly negative. She was treated with  6 weeks of antibiotics  She developed recurrent 'septic arthritis" and bacteremia and TEE confirmed Vegetation on RA lead. She was transferred to [name] for extraction which she tolerated well.   Rx with 8 weeks of IV Abx, most recently 10/18 keflex and rifampin.  Because of worsening left ventricular function and worsening functional status, she underwent CRT reimplantation  01/2017, aware that she would need long-term chronic suppressive antibiotics. Per. Dr. Odessa Fleming recent OV note, she has been considerably better following CRT implantation.  There is less shortness of breath and more energy.  Repeat echo has not yet been done.  03/23/2018 -this is 3 months follow-up, the patient has stable symptoms class IIb-III, she is now more independent as she needs to help her husband, still gets short of breath after short distances at home but is able to perform activities of daily living with some interruptions.  She has been compliant to medications.  Her primary care physician Dr Jeannetta Nap changed Synthroid that we prescribed to a different medication whose name she does not remember.  She denies any lower extremity edema orthopnea proximal nocturnal dyspnea.  05/27/2018 - 2 months follow up, she and her husband has been staying at home mostly, she has been trying to walk 20 minutes a day outside and do leg exercises, she has lost 6 pounds, she is able to walk outside and her shortness of breath gets better with walking however she has noticed worsening dyspnea when on incline or when walking stairs.  She denies any palpitation dizziness or falls.  She has been compliant with her medications and has no side effects.  07/20/2018 -this is 2 months follow-up, the patient states that she feels very well, she has no lower extremity edema and only occasional paroxysmal nocturnal dyspnea.  She walks outside in her yard every day, she has no worsening shortness of breath, she actually feels best in a long time.  She denies any palpitations dizziness or syncope.  Her blood pressure is always  in low 100s but it is asymptomatic.  09/25/2018 - 2 months follow up, no labs since 03/23/18, she has been feeling well, walks 2-3 x/day without chest pain and no significant DOE. She has a blood work done suspicious for hemochromatosis (polycytemia, also FH - father diagnosed and daughter borderline), she was  also diagnosed with CKD stage 4 and was taken off spironolactone. She feels well, no LE edema, orthopnea, PND.  11/02/2018 -the states 1 month follow-up, patient's condition is stable, she is able to walk every day, she continues to take Keflex and rifampin.  She denies any lower extremity edema orthopnea or proximal nocturnal dyspnea.  In fact she has lost 3 pounds.  Her pacemaker alarm has been going off several nights in a row.  She underwent testing for hemochromatosis and it came back borderline so they want her retested.   Prior CV studies:   The following studies were reviewed today:  08/14/16: TEE Study Conclusions - Left ventricle: Systolic function was normal. The estimated ejection fraction was in the range of 55% to 60%. Wall motion was normal; there were no regional wall motion abnormalities. - Aortic valve: No evidence of vegetation. There was mild regurgitation. - Aorta: There was mild non-mobile atheroma. - Mitral valve: There was mild regurgitation. - Left atrium: No evidence of thrombus in the atrial cavity or appendage. No evidence of thrombus in the atrial cavity or appendage. - Right atrium: No evidence of thrombus in the atrial cavity or appendage. No evidence of thrombus in the atrial cavity or appendage. - Tricuspid valve: No evidence of vegetation. There was mild regurgitation. - Pulmonic valve: No evidence of vegetation. Impressions: - There is a highly mobile echodensity measuring 13 x 3 mm attached to the right atrial lead in the right atrium at the tricuspid valve level. This is consistent with pacemaker endocarditis.  10/17/15: limited echo for AV optimization Impressions: - The study is performed for AV optimization There is grade 1 diastolic dysfunction The optimal AV delay seems to be around 90-100 ms but i would defer to the doctors managing the pacer.  02/01/15: TTE Study Conclusions - Left ventricle: The cavity size  was normal. There was moderate concentric hypertrophy. Systolic function was normal. The estimated ejection fraction was in the range of 50% to 55%. Wall motion was normal; there were no regional wall motion abnormalities. Doppler parameters are consistent with abnormal left ventricular relaxation (grade 1 diastolic dysfunction). The E/e&' ratio is >15, suggesting elevated LV filling pressure. - Mitral valve: Calcified annulus. Mildly thickened leaflets . There was trivial regurgitation. - Left atrium: The atrium was normal in size. - Right ventricle: The cavity size was normal. Wall thickness was normal. Pacer wire or catheter noted in right ventricle. Systolic function was normal. - Right atrium: The atrium was normal in size. Pacer wire or catheter noted in right atrium. - Tricuspid valve: There was moderate regurgitation. - Pulmonary arteries: PA peak pressure: 30 mm Hg (S). - Inferior vena cava: The vessel was normal in size. The respirophasic diameter changes were in the normal range (= 50%), consistent with normal central venous pressure. Impressions: - Compared to a prior study in 09/2014, the EF has improved to 50-55%.  The patient does not have symptoms concerning for COVID-19 infection (fever, chills, cough, or new shortness of breath).    Past Medical History:  Diagnosis Date   Acrodermatitis continua of [place]    "was in remission for 5 years the 1st time; ~ 7 years  the second time" (06/21/2016)   Acute lower GI bleeding 05/06/2017   Acute lower GI bleeding 05/06/2017   Anemia 05/06/2017   Arthritis    "finger joints" (06/21/2016)   Asthma    CHF (congestive heart failure) (HCC)    Congestive dilated cardiomyopathy (HCC) 03/31/2014   a. s/p Biotronik CRTD 06/2014   GERD (gastroesophageal reflux disease)    Headache    History of blood transfusion 1990s   "related to hysterectomy"   Hypothyroidism    Left bundle branch  block    MSSA bacteremia 06/21/2016   Myocardial infarction (HCC)    Pancreatitis, acute    Past Surgical History:  Procedure Laterality Date   ABDOMINAL HYSTERECTOMY     APPENDECTOMY     BIV PACEMAKER INSERTION CRT-P N/A 01/17/2017   Procedure: BIV PACEMAKER INSERTION CRT-P;  Surgeon: Duke Salvia, MD;  Location: [name] INVASIVE CV LAB;  Service: Cardiovascular;  Laterality: N/A;   CARDIAC CATHETERIZATION     CARDIAC DEFIBRILLATOR PLACEMENT  ?2016   CATARACT EXTRACTION W/ INTRAOCULAR LENS  IMPLANT, BILATERAL Bilateral    CHOLECYSTECTOMY OPEN     "related to pancreatitis"   DILATION AND CURETTAGE OF UTERUS     EP IMPLANTABLE DEVICE N/A 07/04/2014   Procedure: BiV ICD Insertion CRT-D;  Surgeon: Duke Salvia, MD;  Location:  [name] INVASIVE CV LAB;  Service: Cardiovascular;  Laterality: N/A;   ESOPHAGOGASTRODUODENOSCOPY (EGD) WITH PROPOFOL N/A 05/08/2017   Procedure: ESOPHAGOGASTRODUODENOSCOPY (EGD) WITH PROPOFOL;  Surgeon: Graylin Shiver, MD;  Location: [facility] ENDOSCOPY;  Service: Endoscopy;  Laterality: N/A;   HERNIA REPAIR     I&D KNEE WITH POLY EXCHANGE Right 08/11/2016   Procedure: IRRIGATION AND DEBRIDEMENT KNEE WITH POLY EXCHANGE;  Surgeon: Samson Frederic, MD;  Location: [facility] OR;  Service: Orthopedics;  Laterality: Right;   INSERT / REPLACE / REMOVE PACEMAKER  ?2016   JOINT REPLACEMENT Bilateral    "thumb joints; used my own material"   JOINT REPLACEMENT     LEFT HEART CATHETERIZATION WITH CORONARY ANGIOGRAM N/A 11/02/2013   Procedure: LEFT HEART CATHETERIZATION WITH CORONARY ANGIOGRAM;  Surgeon: Corky Crafts, MD;  Location: [facility] CATH LAB;  Service: Cardiovascular;  Laterality: N/A;   LOOP RECORDER IMPLANT N/A 04/13/2014   Procedure: LOOP RECORDER IMPLANT;  Surgeon: Duke Salvia, MD;  Location: [facility] CATH LAB;  Service: Cardiovascular;  Laterality: N/A;   LOOP RECORDER REMOVAL  ?2016   POSTERIOR LUMBAR FUSION  05/03/2016   Lumbar Two-Three, Lumbar Three-Four Posterior  Lumbar Fusion withLaminectomy and Foraminotomy   SPINE SURGERY  04/2016   lumbar    TEE WITHOUT CARDIOVERSION N/A 08/14/2016   Procedure: TRANSESOPHAGEAL ECHOCARDIOGRAM (TEE);  Surgeon: Lars Masson, MD;  Location: [name] ENDOSCOPY;  Service: Cardiovascular;  Laterality: N/A;   TONSILLECTOMY     TOTAL HIP ARTHROPLASTY Bilateral    UMBILICAL HERNIA REPAIR  1970s?     Current Meds  Medication Sig   albuterol (PROAIR HFA) 108 (90 Base) MCG/ACT inhaler Inhale 2 puffs into the lungs every 6 (six) hours as needed for wheezing or shortness of breath.   betamethasone dipropionate (DIPROLENE) 0.05 % ointment Apply 1 application topically daily as needed.   Calcium Carbonate Antacid (TUMS PO) Take 1 tablet by mouth daily as needed (indigestion).    carvedilol (COREG) 12.5 MG tablet TAKE 1 TABLET TWICE A DAY   cephALEXin (KEFLEX) 500 MG capsule Take 2 capsules (1,000 mg total) by mouth 2 (two) times daily.   ENTRESTO 24-26 MG TAKE 1 TABLET TWICE  A DAY   feeding supplement, ENSURE ENLIVE, (ENSURE ENLIVE) LIQD Take 237 mLs by mouth 2 (two) times daily between meals.   Fluticasone-Salmeterol (ADVAIR DISKUS) 250-50 MCG/DOSE AEPB Inhale 1 puff into the lungs 2 (two) times daily.   furosemide (LASIX) 20 MG tablet Take 1 tablet (20 mg total) by mouth daily as needed for edema.   levothyroxine (SYNTHROID) 112 MCG tablet Take 112 mcg by mouth daily before breakfast. 2 on Sunday   Melatonin 3 MG TABS Take 3 mg by mouth at bedtime as needed (for sleep.).    methocarbamol (ROBAXIN) 500 MG tablet Take 1 tablet (500 mg total) by mouth every 6 (six) hours as needed for muscle spasms.   methylphenidate (RITALIN) 20 MG tablet Take 40 mg by mouth daily.    Naphazoline-Pheniramine (ALLERGY EYE OP) Place 1-2 drops into both eyes daily as needed (for irritated eyes).   ondansetron (ZOFRAN) 8 MG tablet Take 8 mg by mouth as needed.   pantoprazole (PROTONIX) 40 MG tablet Take 40 mg by mouth daily.    polyethylene glycol (MIRALAX / GLYCOLAX) packet Take 17 g by mouth daily as needed for mild constipation.   rifampin (RIFADIN) 300 MG capsule TAKE 1 CAPSULE(300 MG) BY MOUTH EVERY 12 HOURS     Allergies:   Sulfa antibiotics   Social History   Tobacco Use   Smoking status: Never Smoker   Smokeless tobacco: Never Used   Tobacco comment: "smoked as a teenager; for maybe 1 wk"  Substance Use Topics   Alcohol use: Yes    Comment: 6/15//2018 "glass of wine q 6 months or so"   Drug use: No     Family Hx: The patient's family history includes Arrhythmia in her mother; Heart attack in her father, maternal grandfather, and paternal grandfather; Heart disease in her father; Hyperlipidemia in her mother.  ROS:   Please see the history of present illness.    All other systems reviewed and are negative.  Labs/Other Tests and Data Reviewed:    EKG:  No ECG reviewed.  Recent Labs: 12/23/2017: Hemoglobin 12.3; Platelets 163 03/23/2018: ALT 12; BUN 22; Creatinine, Ser 1.06; NT-Pro BNP 165; Potassium 5.1; Sodium 143; TSH 24.970   Recent Lipid Panel Lab Results  Component Value Date/Time   CHOL 171 05/06/2017 03:51 PM   TRIG 127 05/06/2017 03:51 PM   HDL 43 05/06/2017 03:51 PM   CHOLHDL 4.0 05/06/2017 03:51 PM   LDLCALC 103 (H) 05/06/2017 03:51 PM   Wt Readings from Last 3 Encounters:  11/02/18 156 lb (70.8 kg)  09/25/18 161 lb (73 kg)  07/20/18 157 lb (71.2 kg)    Objective:    Vital Signs:  BP 121/70    Pulse 60    Ht 4\' 10"  (1.473 m)    Wt 156 lb (70.8 kg)    LMP  (LMP Unknown)    BMI 32.60 kg/m    VITAL SIGNS:  reviewed     ASSESSMENT & PLAN:    1.  Acute on chronic chronic systolic HF:  -Weight is down 3 pounds, she is euvolemic, she is now off spironolactone -Fattening was normal in March.  We will repeat tomorrow with BNP. Sherryll Burger- Entresto on for at least a year and normal Crea in March 2020  2.  Nonischemic cardiomyopathy - LVEF 35 to 40% on the most recent  echocardiogram from January 2020, initially improvement to 50 to 55% with a BiV ICD, however her device get infected had to be removed she  received another device in January 2019, post explantation her LVEF dropped to 30 to 35% now improved to 35 to 40%. - I will relook her MRI for possible signs of hemochromatosis  3. BiV/ICD: history outline above in HPI. Required extraction due to bacteremia/ lead vegetation. Underwent re-implantation 01/2017.  Followed by Dr. Caryl Comes. Functionally she seemed to be doing quite well following CRT implantation.  QRS is considerably narrowed.    4.  Hypothyroidism, followed by her primary care physician, we will repeat labs tomorrow.  COVID-19 Education: The signs and symptoms of COVID-19 were discussed with the patient and how to seek care for testing (follow up with PCP or arrange E-visit).  The importance of social distancing was discussed today.  Time:   Today, I have spent 25 minutes with the patient with telehealth technology discussing the above problems.    Medication Adjustments/Labs and Tests Ordered: Current medicines are reviewed at length with the patient today.  Concerns regarding medicines are outlined above.   Tests Ordered: No orders of the defined types were placed in this encounter.   Medication Changes: No orders of the defined types were placed in this encounter.   Disposition:  Follow up in 4 month(s)  Signed, Ena Dawley, MD  11/02/2018 9:40 AM    [place]

## 2018-11-02 ENCOUNTER — Other Ambulatory Visit: Payer: Medicare Other

## 2018-11-02 ENCOUNTER — Encounter: Payer: Self-pay | Admitting: Cardiology

## 2018-11-02 ENCOUNTER — Telehealth (INDEPENDENT_AMBULATORY_CARE_PROVIDER_SITE_OTHER): Payer: Medicare Other | Admitting: Cardiology

## 2018-11-02 ENCOUNTER — Other Ambulatory Visit: Payer: Self-pay

## 2018-11-02 VITALS — BP 121/70 | HR 60 | Ht <= 58 in | Wt 156.0 lb

## 2018-11-02 DIAGNOSIS — I5043 Acute on chronic combined systolic (congestive) and diastolic (congestive) heart failure: Secondary | ICD-10-CM

## 2018-11-02 DIAGNOSIS — I428 Other cardiomyopathies: Secondary | ICD-10-CM | POA: Diagnosis not present

## 2018-11-02 DIAGNOSIS — E039 Hypothyroidism, unspecified: Secondary | ICD-10-CM | POA: Diagnosis not present

## 2018-11-02 DIAGNOSIS — I201 Angina pectoris with documented spasm: Secondary | ICD-10-CM

## 2018-11-02 DIAGNOSIS — I5022 Chronic systolic (congestive) heart failure: Secondary | ICD-10-CM

## 2018-11-02 DIAGNOSIS — I420 Dilated cardiomyopathy: Secondary | ICD-10-CM

## 2018-11-02 NOTE — Patient Instructions (Addendum)
Medication Instructions:   Your physician recommends that you continue on your current medications as directed. Please refer to the Current Medication list given to you today.  *If you need a refill on your cardiac medications before your next appointment, please call your pharmacy*    Your physician recommends that you return for lab work in: [place] CMET, TSH, FREE T3, FREE T4, AND PRO-BNP     Follow-Up: At [facility], you and your health needs are our priority.  As part of our continuing mission to provide you with exceptional heart care, we have created designated Provider Care Teams.  These Care Teams include your primary Cardiologist (physician) and Advanced Practice Providers (APPs -  Physician Assistants and Nurse Practitioners) who all work together to provide you with the care you need, when you need it.  Your next appointment:    February 03, 2019 AT 1:40 PM  The format for your next appointment:   In Person  Provider:   Ena Dawley, MD  Other Instructions '

## 2018-11-03 ENCOUNTER — Ambulatory Visit (INDEPENDENT_AMBULATORY_CARE_PROVIDER_SITE_OTHER): Payer: Medicare Other | Admitting: *Deleted

## 2018-11-03 DIAGNOSIS — I428 Other cardiomyopathies: Secondary | ICD-10-CM

## 2018-11-03 DIAGNOSIS — R55 Syncope and collapse: Secondary | ICD-10-CM

## 2018-11-04 ENCOUNTER — Other Ambulatory Visit: Payer: Self-pay | Admitting: Cardiology

## 2018-11-04 ENCOUNTER — Other Ambulatory Visit: Payer: Medicare Other | Admitting: *Deleted

## 2018-11-04 DIAGNOSIS — Z95810 Presence of automatic (implantable) cardiac defibrillator: Secondary | ICD-10-CM

## 2018-11-04 DIAGNOSIS — I5022 Chronic systolic (congestive) heart failure: Secondary | ICD-10-CM

## 2018-11-04 DIAGNOSIS — I428 Other cardiomyopathies: Secondary | ICD-10-CM

## 2018-11-04 DIAGNOSIS — E039 Hypothyroidism, unspecified: Secondary | ICD-10-CM

## 2018-11-04 DIAGNOSIS — I420 Dilated cardiomyopathy: Secondary | ICD-10-CM

## 2018-11-04 LAB — CUP PACEART REMOTE DEVICE CHECK
Battery Remaining Longevity: 101 mo
Battery Voltage: 2.99 V
Brady Statistic AP VP Percent: 14.76 %
Brady Statistic AP VS Percent: 0.22 %
Brady Statistic AS VP Percent: 82.84 %
Brady Statistic AS VS Percent: 2.17 %
Brady Statistic RA Percent Paced: 15.21 %
Brady Statistic RV Percent Paced: 16.6 %
Date Time Interrogation Session: 20201027050522
Implantable Lead Implant Date: 20190111
Implantable Lead Implant Date: 20190111
Implantable Lead Implant Date: 20190111
Implantable Lead Location: 753858
Implantable Lead Location: 753859
Implantable Lead Location: 753860
Implantable Lead Model: 5076
Implantable Lead Model: 5076
Implantable Pulse Generator Implant Date: 20190111
Lead Channel Impedance Value: 1368 Ohm
Lead Channel Impedance Value: 1387 Ohm
Lead Channel Impedance Value: 1406 Ohm
Lead Channel Impedance Value: 1482 Ohm
Lead Channel Impedance Value: 1482 Ohm
Lead Channel Impedance Value: 1520 Ohm
Lead Channel Impedance Value: 342 Ohm
Lead Channel Impedance Value: 361 Ohm
Lead Channel Impedance Value: 380 Ohm
Lead Channel Impedance Value: 437 Ohm
Lead Channel Impedance Value: 703 Ohm
Lead Channel Impedance Value: 779 Ohm
Lead Channel Impedance Value: 798 Ohm
Lead Channel Impedance Value: 836 Ohm
Lead Channel Pacing Threshold Amplitude: 0.5 V
Lead Channel Pacing Threshold Amplitude: 0.875 V
Lead Channel Pacing Threshold Amplitude: 2.25 V
Lead Channel Pacing Threshold Pulse Width: 0.4 ms
Lead Channel Pacing Threshold Pulse Width: 0.4 ms
Lead Channel Pacing Threshold Pulse Width: 1 ms
Lead Channel Sensing Intrinsic Amplitude: 1.25 mV
Lead Channel Sensing Intrinsic Amplitude: 1.25 mV
Lead Channel Sensing Intrinsic Amplitude: 10.375 mV
Lead Channel Sensing Intrinsic Amplitude: 10.375 mV
Lead Channel Setting Pacing Amplitude: 2 V
Lead Channel Setting Pacing Amplitude: 2.25 V
Lead Channel Setting Pacing Amplitude: 2.5 V
Lead Channel Setting Pacing Pulse Width: 0.4 ms
Lead Channel Setting Pacing Pulse Width: 1 ms
Lead Channel Setting Sensing Sensitivity: 0.9 mV

## 2018-11-04 LAB — COMPREHENSIVE METABOLIC PANEL
ALT: 15 IU/L (ref 0–32)
AST: 24 IU/L (ref 0–40)
Albumin/Globulin Ratio: 1.8 (ref 1.2–2.2)
Albumin: 4.4 g/dL (ref 3.7–4.7)
Alkaline Phosphatase: 55 IU/L (ref 39–117)
BUN/Creatinine Ratio: 22 (ref 12–28)
BUN: 22 mg/dL (ref 8–27)
Bilirubin Total: 0.5 mg/dL (ref 0.0–1.2)
CO2: 24 mmol/L (ref 20–29)
Calcium: 9.4 mg/dL (ref 8.7–10.3)
Chloride: 102 mmol/L (ref 96–106)
Creatinine, Ser: 0.99 mg/dL (ref 0.57–1.00)
GFR calc Af Amer: 62 mL/min/{1.73_m2} (ref >59)
GFR calc non Af Amer: 54 mL/min/{1.73_m2} — ABNORMAL LOW (ref >59)
Globulin, Total: 2.4 g/dL (ref 1.5–4.5)
Glucose: 99 mg/dL (ref 65–99)
Potassium: 5 mmol/L (ref 3.5–5.2)
Sodium: 139 mmol/L (ref 134–144)
Total Protein: 6.8 g/dL (ref 6.0–8.5)

## 2018-11-04 LAB — T3, FREE: T3, Free: 2.1 pg/mL (ref 2.0–4.4)

## 2018-11-04 LAB — TSH: TSH: 23.3 u[IU]/mL — ABNORMAL HIGH (ref 0.450–4.500)

## 2018-11-04 LAB — PRO B NATRIURETIC PEPTIDE: NT-Pro BNP: 231 pg/mL (ref 0–738)

## 2018-11-05 ENCOUNTER — Telehealth: Payer: Self-pay | Admitting: *Deleted

## 2018-11-05 MED ORDER — LEVOTHYROXINE SODIUM 125 MCG PO TABS: 125.0000 ug | ORAL_TABLET | Freq: Every day | ORAL | 1 refills | Status: DC

## 2018-11-05 NOTE — Telephone Encounter (Signed)
-----   Message from Dorothy Spark, MD sent at 11/05/2018 10:44 AM EDT ----- TSH remains elevated, I would increase synthroid to 125 mcg

## 2018-11-05 NOTE — Telephone Encounter (Signed)
Spoke with the pt and informed her that per Dr Meda Coffee, her TSH remains elevated, and she recommends that we increase her synthroid to 125 mcg po daily before breakfast.  Confirmed the pharmacy of choice with the pt.  Pt verbalized understanding and agrees with this plan.

## 2018-11-16 ENCOUNTER — Other Ambulatory Visit: Payer: Self-pay | Admitting: Infectious Disease

## 2018-11-16 DIAGNOSIS — T8450XS Infection and inflammatory reaction due to unspecified internal joint prosthesis, sequela: Secondary | ICD-10-CM

## 2018-11-16 DIAGNOSIS — T827XXA Infection and inflammatory reaction due to other cardiac and vascular devices, implants and grafts, initial encounter: Secondary | ICD-10-CM

## 2018-11-24 NOTE — Progress Notes (Signed)
Remote pacemaker transmission.   

## 2019-01-05 ENCOUNTER — Telehealth: Payer: Self-pay | Admitting: Cardiology

## 2019-01-05 NOTE — Telephone Encounter (Signed)
Pt has a pacemaker.  Not able to "go off". No alerts received. Pt transmitting wirelessly.  Will route to [place]/Dr Caryl Comes for review  Chanetta Marshall, NP 01/05/2019 12:32 PM

## 2019-01-05 NOTE — Telephone Encounter (Signed)
Patient c/o Palpitations:  High priority if patient c/o lightheadedness, shortness of breath, or chest pain  1) How long have you had palpitations/irregular HR/ Afib? Are you having the symptoms now? Irregular HR  2) Are you currently experiencing lightheadedness, SOB or CP? Mild SOB. Pt is just sob a lot of the time. She is not sure if it is heart related   3) Do you have a history of afib (atrial fibrillation) or irregular heart rhythm? yes  4) Have you checked your BP or HR? (document readings if available):   12/29: 105/60 HR 65   12/28: 94/80 HR 70  12/27: 120/65 HR 86  12/26: 115/70 HR 64  12/25: 108/62 HR 69  5) Are you experiencing any other symptoms? Irregular HR. Device went off last night   1. Has your device fired? Yes.  2. Is you device beeping? no  3. Are you experiencing draining or swelling at device site? no  4. Are you calling to see if we received your device transmission? no  5. Have you passed out? no   Patient just wanted to know if she needed to be seen. She has had some irregular HR

## 2019-01-06 NOTE — Telephone Encounter (Signed)
Can we ask her to transmit to see if n afib  ,ts   Thanks SK

## 2019-01-21 ENCOUNTER — Telehealth: Payer: Self-pay

## 2019-01-21 NOTE — Telephone Encounter (Signed)
The pt states she had some skipped heartbeats. The last time it happened was 2 weeks ago. She would like for someone to look at the last transmission she sent to see if the skipped heartbeats was picked up on the transmission. I let her talk with Harrietta Guardian to reschedule her appointment with Dr. Graciela Husbands. I also gave her the number to [facility] tech support to see why her monitor lights flash in the middle of the night.

## 2019-01-22 NOTE — Telephone Encounter (Signed)
Spoke with patient. She reports she occasionally feels skipped beats, sometimes as often as every 5 beat. Episodes do not happen frequently and do not last long. No other associated symptoms. Reassured pt that transmissions sent on 01/14/19 show normal CRT-P function, CRT pacing 97%. No alerts or episodes. Due to frequent manual transmissions, PVC counters are not available. Explained skipped beats are likely due to PVCs. Requested that pt call in the future prior to sending manual transmissions (unscheduled). Pt verbalizes understanding and agreement with plan. Pt aware to call back prior to appointment with Dr. Graciela Husbands with any additional concerns, or if she notices increased PVC frequency or symptoms. No additional questions at this time.

## 2019-01-29 ENCOUNTER — Telehealth: Payer: Self-pay | Admitting: Cardiology

## 2019-01-29 NOTE — Telephone Encounter (Signed)
Switched the pts visit type to virtual visit, with same date and time.  Pt aware of this.  Pt verbalized understanding and gracious for all the assistance.  

## 2019-01-29 NOTE — Telephone Encounter (Signed)
New message:     Patient calling wanting to know if she have a vv on the 27 instead of ov. Please call patient.

## 2019-02-02 ENCOUNTER — Ambulatory Visit (INDEPENDENT_AMBULATORY_CARE_PROVIDER_SITE_OTHER): Payer: Medicare Other | Admitting: *Deleted

## 2019-02-02 DIAGNOSIS — I428 Other cardiomyopathies: Secondary | ICD-10-CM

## 2019-02-02 LAB — CUP PACEART REMOTE DEVICE CHECK
Battery Remaining Longevity: 101 mo
Battery Voltage: 2.99 V
Brady Statistic AP VP Percent: 24.2 %
Brady Statistic AP VS Percent: 0.22 %
Brady Statistic AS VP Percent: 73.06 %
Brady Statistic AS VS Percent: 2.53 %
Brady Statistic RA Percent Paced: 25.09 %
Brady Statistic RV Percent Paced: 27.97 %
Date Time Interrogation Session: 20210126000812
Implantable Lead Implant Date: 20190111
Implantable Lead Implant Date: 20190111
Implantable Lead Implant Date: 20190111
Implantable Lead Location: 753858
Implantable Lead Location: 753859
Implantable Lead Location: 753860
Implantable Lead Model: 5076
Implantable Lead Model: 5076
Implantable Pulse Generator Implant Date: 20190111
Lead Channel Impedance Value: 1482 Ohm
Lead Channel Impedance Value: 1501 Ohm
Lead Channel Impedance Value: 1501 Ohm
Lead Channel Impedance Value: 1558 Ohm
Lead Channel Impedance Value: 1596 Ohm
Lead Channel Impedance Value: 1596 Ohm
Lead Channel Impedance Value: 399 Ohm
Lead Channel Impedance Value: 418 Ohm
Lead Channel Impedance Value: 456 Ohm
Lead Channel Impedance Value: 475 Ohm
Lead Channel Impedance Value: 760 Ohm
Lead Channel Impedance Value: 874 Ohm
Lead Channel Impedance Value: 874 Ohm
Lead Channel Impedance Value: 874 Ohm
Lead Channel Pacing Threshold Amplitude: 0.5 V
Lead Channel Pacing Threshold Amplitude: 0.75 V
Lead Channel Pacing Threshold Amplitude: 2.125 V
Lead Channel Pacing Threshold Pulse Width: 0.4 ms
Lead Channel Pacing Threshold Pulse Width: 0.4 ms
Lead Channel Pacing Threshold Pulse Width: 1 ms
Lead Channel Sensing Intrinsic Amplitude: 1.5 mV
Lead Channel Sensing Intrinsic Amplitude: 1.5 mV
Lead Channel Sensing Intrinsic Amplitude: 13.375 mV
Lead Channel Sensing Intrinsic Amplitude: 13.375 mV
Lead Channel Setting Pacing Amplitude: 2 V
Lead Channel Setting Pacing Amplitude: 2.25 V
Lead Channel Setting Pacing Amplitude: 2.5 V
Lead Channel Setting Pacing Pulse Width: 0.4 ms
Lead Channel Setting Pacing Pulse Width: 1 ms
Lead Channel Setting Sensing Sensitivity: 0.9 mV

## 2019-02-03 ENCOUNTER — Other Ambulatory Visit: Payer: Self-pay

## 2019-02-03 ENCOUNTER — Encounter: Payer: Self-pay | Admitting: Cardiology

## 2019-02-03 ENCOUNTER — Telehealth (INDEPENDENT_AMBULATORY_CARE_PROVIDER_SITE_OTHER): Payer: Medicare Other | Admitting: Cardiology

## 2019-02-03 VITALS — BP 108/63 | HR 70 | Ht <= 58 in | Wt 162.0 lb

## 2019-02-03 DIAGNOSIS — Z95810 Presence of automatic (implantable) cardiac defibrillator: Secondary | ICD-10-CM | POA: Diagnosis not present

## 2019-02-03 DIAGNOSIS — I5022 Chronic systolic (congestive) heart failure: Secondary | ICD-10-CM | POA: Diagnosis not present

## 2019-02-03 DIAGNOSIS — I5043 Acute on chronic combined systolic (congestive) and diastolic (congestive) heart failure: Secondary | ICD-10-CM

## 2019-02-03 DIAGNOSIS — Z950 Presence of cardiac pacemaker: Secondary | ICD-10-CM

## 2019-02-03 DIAGNOSIS — R072 Precordial pain: Secondary | ICD-10-CM

## 2019-02-03 DIAGNOSIS — I447 Left bundle-branch block, unspecified: Secondary | ICD-10-CM

## 2019-02-03 DIAGNOSIS — R002 Palpitations: Secondary | ICD-10-CM

## 2019-02-03 DIAGNOSIS — I428 Other cardiomyopathies: Secondary | ICD-10-CM

## 2019-02-03 DIAGNOSIS — T827XXA Infection and inflammatory reaction due to other cardiac and vascular devices, implants and grafts, initial encounter: Secondary | ICD-10-CM

## 2019-02-03 MED ORDER — FUROSEMIDE 20 MG PO TABS: 20.0000 mg | ORAL_TABLET | Freq: Every day | ORAL | 1 refills | Status: DC

## 2019-02-03 NOTE — Patient Instructions (Addendum)
Medication Instructions:   START TAKING LASIX 20 MG BY MOUTH DAILY  *If you need a refill on your cardiac medications before your next appointment, please call your pharmacy*   Lab Work:  THIS Friday 02/05/19-WE WILL CHECK CMET, CBC, TSH, FREE T3, FREE T4, PRO-BNP  If you have labs (blood work) drawn today and your tests are completely normal, you will receive your results only by: [name] MyChart Message (if you have MyChart) OR . A paper copy in the mail If you have any lab test that is abnormal or we need to change your treatment, we will call you to review the results.    Follow-Up:  IN 3 - 4 WEEKS WITH DR. Delton See IN THE OFFICE ON 02/24/19 AT 10 AM

## 2019-02-03 NOTE — Progress Notes (Signed)
Virtual Visit via Telephone Note   This visit type was conducted due to national recommendations for restrictions regarding the COVID-19 Pandemic (e.g. social distancing) in an effort to limit this patient's exposure and mitigate transmission in our community.  Due to her co-morbid illnesses, this patient is at least at moderate risk for complications without adequate follow up.  This format is felt to be most appropriate for this patient at this time.  The patient did not have access to video technology/had technical difficulties with video requiring transitioning to audio format only (telephone).  All issues noted in this document were discussed and addressed.  No physical exam could be performed with this format.  Please refer to the patient's chart for her  consent to telehealth for [facility].   Date:  02/03/2019   ID:  [name] [name], DOB 24-Nov-1935, MRN [id number]  Patient Location: Home Provider Location: Home  PCP:  Leonard Downing, MD  Cardiologist:  Ena Dawley, MD  Electrophysiologist: Dr Caryl Comes  Evaluation Performed:  Follow-Up Visit  Reason for visit: 2 months follow-up  History of Present Illness:    [name] [name] is a 85 y.o. female with h/o nonischemic cardiomyopathy, left bundle branch block, nonsustained ventricular tachycardia, HTN and pancreatitis.   She underwent CRT Biotronik implantation  06/2014.  06/2016, she was  hospitalized and found to have staph bacteremia of (MSSA) in the wake of orthopedic surgery.  TEE at [facility] was reportedly negative. She was treated with  6 weeks of antibiotics  She developed recurrent 'septic arthritis" and bacteremia and TEE confirmed Vegetation on RA lead. She was transferred to [facility] for extraction which she tolerated well.   Rx with 8 weeks of IV Abx, most recently 10/18 keflex and rifampin.  Because of worsening left ventricular function and worsening functional status, she underwent CRT reimplantation  01/2017, aware that she would need long-term chronic suppressive antibiotics. Per. Dr. Olin Pia recent [place] note, she has been considerably better following CRT implantation.  There is less shortness of breath and more energy.  Repeat echo has not yet been done.  03/23/2018 -this is 3 months follow-up, the patient has stable symptoms class IIb-III, she is now more independent as she needs to help her husband, still gets short of breath after short distances at home but is able to perform activities of daily living with some interruptions.  She has been compliant to medications.  Her primary care physician Dr Arelia Sneddon changed Synthroid that we prescribed to a different medication whose name she does not remember.  She denies any lower extremity edema orthopnea proximal nocturnal dyspnea.  05/27/2018 - 2 months follow up, she and her husband has been staying at home mostly, she has been trying to walk 20 minutes a day outside and do leg exercises, she has lost 6 pounds, she is able to walk outside and her shortness of breath gets better with walking however she has noticed worsening dyspnea when on incline or when walking stairs.  She denies any palpitation dizziness or falls.  She has been compliant with her medications and has no side effects.  07/20/2018 -this is 2 months follow-up, the patient states that she feels very well, she has no lower extremity edema and only occasional paroxysmal nocturnal dyspnea.  She walks outside in her yard every day, she has no worsening shortness of breath, she actually feels best in a long time.  She denies any palpitations dizziness or syncope.  Her blood pressure is always  in low 100s but it is asymptomatic.  09/25/2018 - 2 months follow up, no labs since 03/23/18, she has been feeling well, walks 2-3 x/day without chest pain and no significant DOE. She has a blood work done suspicious for hemochromatosis (polycytemia, also FH - father diagnosed and daughter borderline), she was  also diagnosed with CKD stage 4 and was taken off spironolactone. She feels well, no LE edema, orthopnea, PND.  02/03/2019 - 4 months follow up, she has noticed some new lower extremity edema, worsening dyspnea on exertion, and some episodes of proximal nocturnal dyspnea.  She denies any chest pain.  She has been compliant with her medications.   Prior CV studies:   The following studies were reviewed today:  08/14/16: TEE Study Conclusions - Left ventricle: Systolic function was normal. The estimated ejection fraction was in the range of 55% to 60%. Wall motion was normal; there were no regional wall motion abnormalities. - Aortic valve: No evidence of vegetation. There was mild regurgitation. - Aorta: There was mild non-mobile atheroma. - Mitral valve: There was mild regurgitation. - Left atrium: No evidence of thrombus in the atrial cavity or appendage. No evidence of thrombus in the atrial cavity or appendage. - Right atrium: No evidence of thrombus in the atrial cavity or appendage. No evidence of thrombus in the atrial cavity or appendage. - Tricuspid valve: No evidence of vegetation. There was mild regurgitation. - Pulmonic valve: No evidence of vegetation. Impressions: - There is a highly mobile echodensity measuring 13 x 3 mm attached to the right atrial lead in the right atrium at the tricuspid valve level. This is consistent with pacemaker endocarditis.  10/17/15: limited echo for AV optimization Impressions: - The study is performed for AV optimization There is grade 1 diastolic dysfunction The optimal AV delay seems to be around 90-100 ms but i would defer to the doctors managing the pacer.  02/01/15: TTE Study Conclusions - Left ventricle: The cavity size was normal. There was moderate concentric hypertrophy. Systolic function was normal. The estimated ejection fraction was in the range of 50% to 55%. Wall motion was normal;  there were no regional wall motion abnormalities. Doppler parameters are consistent with abnormal left ventricular relaxation (grade 1 diastolic dysfunction). The E/e&' ratio is >15, suggesting elevated LV filling pressure. - Mitral valve: Calcified annulus. Mildly thickened leaflets . There was trivial regurgitation. - Left atrium: The atrium was normal in size. - Right ventricle: The cavity size was normal. Wall thickness was normal. Pacer wire or catheter noted in right ventricle. Systolic function was normal. - Right atrium: The atrium was normal in size. Pacer wire or catheter noted in right atrium. - Tricuspid valve: There was moderate regurgitation. - Pulmonary arteries: PA peak pressure: 30 mm Hg (S). - Inferior vena cava: The vessel was normal in size. The respirophasic diameter changes were in the normal range (= 50%), consistent with normal central venous pressure. Impressions: - Compared to a prior study in 09/2014, the EF has improved to 50-55%.  The patient does not have symptoms concerning for COVID-19 infection (fever, chills, cough, or new shortness of breath).    Past Medical History:  Diagnosis Date  . 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 Acute lower GI bleeding 05/06/2017  . Acute lower GI bleeding 05/06/2017  . Anemia 05/06/2017  . Arthritis    "finger joints" (06/21/2016)  . Asthma   .  CHF (congestive heart failure) ([place])   . Congestive dilated cardiomyopathy ([place]) 03/31/2014   a. s/p Biotronik CRTD 06/2014  . GERD (gastroesophageal reflux disease)   . Headache   . History of blood transfusion 1990s   "related to hysterectomy"  . Hypothyroidism   . Left bundle branch block   . MSSA bacteremia 06/21/2016  . Myocardial infarction ([place])   . Pancreatitis, acute    Past Surgical History:  Procedure Laterality Date  . ABDOMINAL HYSTERECTOMY    .  APPENDECTOMY    . BIV PACEMAKER INSERTION CRT-P N/A 01/17/2017   Procedure: BIV PACEMAKER INSERTION CRT-P;  Surgeon: Deboraha Sprang, MD;  Location: [place] [facility];  Service: Cardiovascular;  Laterality: N/A;  . CARDIAC CATHETERIZATION    . CARDIAC DEFIBRILLATOR PLACEMENT  ?2016  . CATARACT EXTRACTION W/ INTRAOCULAR LENS  IMPLANT, BILATERAL Bilateral   . CHOLECYSTECTOMY OPEN     "related to pancreatitis"  . DILATION AND CURETTAGE OF UTERUS    . EP IMPLANTABLE DEVICE N/A 07/04/2014   Procedure: BiV ICD Insertion CRT-D;  Surgeon: Deboraha Sprang, MD;  Location: [place] [facility];  Service: Cardiovascular;  Laterality: N/A;  . ESOPHAGOGASTRODUODENOSCOPY (EGD) WITH PROPOFOL N/A 05/08/2017   Procedure: ESOPHAGOGASTRODUODENOSCOPY (EGD) WITH PROPOFOL;  Surgeon: Wonda Horner, MD;  Location: [facility] ENDOSCOPY;  Service: Endoscopy;  Laterality: N/A;  . HERNIA REPAIR    . I & D KNEE WITH POLY EXCHANGE Right 08/11/2016   Procedure: IRRIGATION AND DEBRIDEMENT KNEE WITH POLY EXCHANGE;  Surgeon: Rod Can, MD;  Location: [place];  Service: Orthopedics;  Laterality: Right;  . INSERT / REPLACE / REMOVE PACEMAKER  ?2016  . JOINT REPLACEMENT Bilateral    "thumb joints; used my own material"  . JOINT REPLACEMENT    . LEFT HEART CATHETERIZATION WITH CORONARY ANGIOGRAM N/A 11/02/2013   Procedure: LEFT HEART CATHETERIZATION WITH CORONARY ANGIOGRAM;  Surgeon: Jettie Booze, MD;  Location: [facility] CATH LAB;  Service: Cardiovascular;  Laterality: N/A;  . LOOP RECORDER IMPLANT N/A 04/13/2014   Procedure: LOOP RECORDER IMPLANT;  Surgeon: Deboraha Sprang, MD;  Location: [facility] CATH LAB;  Service: Cardiovascular;  Laterality: N/A;  . LOOP RECORDER REMOVAL  ?2016  . POSTERIOR LUMBAR FUSION  05/03/2016   Lumbar Two-Three, Lumbar Three-Four Posterior Lumbar Fusion withLaminectomy and Foraminotomy  . SPINE SURGERY  04/2016   lumbar   . TEE WITHOUT CARDIOVERSION N/A 08/14/2016   Procedure: TRANSESOPHAGEAL ECHOCARDIOGRAM (TEE);   Surgeon: Dorothy Spark, MD;  Location: [facility] ENDOSCOPY;  Service: Cardiovascular;  Laterality: N/A;  . TONSILLECTOMY    . TOTAL HIP ARTHROPLASTY Bilateral   . UMBILICAL HERNIA REPAIR  1970s?     Current Meds  Medication Sig  . albuterol (PROAIR HFA) 108 (90 Base) MCG/ACT inhaler Inhale 2 puffs into the lungs every 6 (six) hours as needed for wheezing or shortness of breath.  . Aspirin-Acetaminophen-Caffeine (EXCEDRIN PO) Take 1 tablet by mouth as needed.  . betamethasone dipropionate (DIPROLENE) 0.05 % ointment Apply 1 application topically daily as needed.  . Calcium Carbonate Antacid (TUMS PO) Take 1 tablet by mouth daily as needed (indigestion).   . carvedilol (COREG) 12.5 MG tablet TAKE 1 TABLET TWICE A DAY  . cephALEXin (KEFLEX) 500 MG capsule Take 2 capsules (1,000 mg total) by mouth 2 (two) times daily.  [name] ENTRESTO 24-26 MG TAKE 1 TABLET TWICE A DAY  . feeding supplement, ENSURE ENLIVE, (ENSURE ENLIVE) LIQD Take 237 mLs by mouth 2 (two) times daily between meals.  [name]  Fluticasone-Salmeterol (ADVAIR DISKUS) 250-50 MCG/DOSE AEPB Inhale 1 puff into the lungs 2 (two) times daily.  [name] levothyroxine (SYNTHROID) 125 MCG tablet Take 1 tablet (125 mcg total) by mouth daily before breakfast.  . Melatonin 3 MG TABS Take 3 mg by mouth at bedtime as needed (for sleep.).   [name] methocarbamol (ROBAXIN) 500 MG tablet Take 1 tablet (500 mg total) by mouth every 6 (six) hours as needed for muscle spasms.  . methylphenidate (RITALIN) 20 MG tablet Take 40 mg by mouth daily.   Mable Fill (ALLERGY EYE OP) Place 1-2 drops into both eyes daily as needed (for irritated eyes).  . ondansetron (ZOFRAN) 8 MG tablet Take 8 mg by mouth as needed.  . pantoprazole (PROTONIX) 40 MG tablet Take 40 mg by mouth daily.  . polyethylene glycol (MIRALAX / GLYCOLAX) packet Take 17 g by mouth daily as needed for mild constipation.  . rifampin (RIFADIN) 300 MG capsule TAKE 1 CAPSULE(300 MG) BY MOUTH EVERY 12 HOURS    . [DISCONTINUED] furosemide (LASIX) 20 MG tablet Take 1 tablet (20 mg total) by mouth daily as needed for edema.     Allergies:   Sulfa antibiotics   Social History   Tobacco Use  . Smoking status: Never Smoker  . Smokeless tobacco: Never Used  . Tobacco comment: "smoked as a teenager; for maybe 1 wk"  Substance Use Topics  . Alcohol use: Yes    Comment: 6/15//2018 "glass of wine q 6 months or so"  . Drug use: No     Family Hx: The patient's family history includes Arrhythmia in her mother; Heart attack in her father, maternal grandfather, and paternal grandfather; Heart disease in her father; Hyperlipidemia in her mother.  ROS:   Please see the history of present illness.    All other systems reviewed and are negative.  Labs/Other Tests and Data Reviewed:    EKG:  No ECG reviewed.  Recent Labs: 11/04/2018: ALT 15; BUN 22; Creatinine, Ser 0.99; NT-Pro BNP 231; Potassium 5.0; Sodium 139; TSH 23.300   Recent Lipid Panel Lab Results  Component Value Date/Time   CHOL 171 05/06/2017 03:51 PM   TRIG 127 05/06/2017 03:51 PM   HDL 43 05/06/2017 03:51 PM   CHOLHDL 4.0 05/06/2017 03:51 PM   LDLCALC 103 (H) 05/06/2017 03:51 PM   Wt Readings from Last 3 Encounters:  02/03/19 162 lb (73.5 kg)  11/02/18 156 lb (70.8 kg)  09/25/18 161 lb (73 kg)    Objective:    Vital Signs:  BP 108/63   Pulse 70   Ht 4' 10" (1.473 m)   Wt 162 lb (73.5 kg)   LMP  (LMP Unknown)   BMI 33.86 kg/m    VITAL SIGNS:  reviewed     ASSESSMENT & PLAN:    1.  Acute on chronic chronic systolic HF:  -OptiVol also showed increased volume -I will order CMP, BNP, CBC and TSH including free T3 and free T4 -I will start Lasix 20 mg every day instead of as needed  2.  Nonischemic cardiomyopathy - LVEF 35 to 40% on the most recent echocardiogram from January 2020, initially improvement to 50 to 55% with a BiV ICD, however her device get infected had to be removed she received another device in  January 2019, post explantation her LVEF dropped to 30 to 35% now improved to 35 to 40%. - I will relook her MRI for possible signs of hemochromatosis  3. BiV/ICD: history outline above in HPI.  Required extraction due to bacteremia/lead vegetation. Underwent re-implantation 01/2017.  Followed by Dr. Caryl Comes. Functionally she seemed to be doing quite well following CRT implantation.  QRS is considerably narrowed.    4.  Hypothyroidism, I will repeat TSH free T3 and free T4 and adjust her Synthroid dose.  COVID-19 Education: The signs and symptoms of COVID-19 were discussed with the patient and how to seek care for testing (follow up with PCP or arrange E-visit).  The importance of social distancing was discussed today.  Time:   Today, I have spent 25 minutes with the patient with telehealth technology discussing the above problems.    Medication Adjustments/Labs and Tests Ordered: Current medicines are reviewed at length with the patient today.  Concerns regarding medicines are outlined above.   Tests Ordered: Orders Placed This Encounter  Procedures  . Comp Met (CMET)  . CBC  . Pro b natriuretic peptide  . T4, free  . T3, free  . TSH    Medication Changes: Meds ordered this encounter  Medications  . furosemide (LASIX) 20 MG tablet    Sig: Take 1 tablet (20 mg total) by mouth daily.    Dispense:  90 tablet    Refill:  1    Disposition:  Follow up in 4 month(s)  Signed, Ena Dawley, MD  02/03/2019 5:41 PM    [place]

## 2019-02-05 ENCOUNTER — Other Ambulatory Visit: Payer: Medicare Other | Admitting: *Deleted

## 2019-02-05 ENCOUNTER — Other Ambulatory Visit: Payer: Self-pay

## 2019-02-05 ENCOUNTER — Telehealth: Payer: Self-pay | Admitting: *Deleted

## 2019-02-05 DIAGNOSIS — Z950 Presence of cardiac pacemaker: Secondary | ICD-10-CM

## 2019-02-05 DIAGNOSIS — E032 Hypothyroidism due to medicaments and other exogenous substances: Secondary | ICD-10-CM

## 2019-02-05 DIAGNOSIS — R7989 Other specified abnormal findings of blood chemistry: Secondary | ICD-10-CM

## 2019-02-05 DIAGNOSIS — T827XXA Infection and inflammatory reaction due to other cardiac and vascular devices, implants and grafts, initial encounter: Secondary | ICD-10-CM

## 2019-02-05 DIAGNOSIS — I428 Other cardiomyopathies: Secondary | ICD-10-CM

## 2019-02-05 DIAGNOSIS — I5022 Chronic systolic (congestive) heart failure: Secondary | ICD-10-CM

## 2019-02-05 MED ORDER — LEVOTHYROXINE SODIUM 175 MCG PO TABS: 175.0000 ug | ORAL_TABLET | Freq: Every day | ORAL | 1 refills | Status: DC

## 2019-02-05 NOTE — Telephone Encounter (Signed)
Spoke with the pt and informed her that per Dr. Delton See, her labs showed elevated TSH level and she recommends that we increase her synthroid to 175 mcg po daily before breakfast, and recheck her TSH in 3-4 weeks. Confirmed the pharmacy of choice with the pt.  Informed the pt that I will only send in a months supply of this med, incase this gets titrated again with recheck of her TSH level in a couple of weeks. Pt is requesting to have her TSH levels checked same day as she comes to see Dr. Delton See in 3 weeks on 02/24/19.  Scheduled her to have her TSH level rechecked same day as she see's Dr. Delton See on 2/17.  Informed the pt that her cbc and pro-bnp is still pending, but once that comes back, we will call her with that result. Pt verbalized understanding and agrees with this plan.

## 2019-02-05 NOTE — Telephone Encounter (Signed)
-----   Message from Lars Masson, MD sent at 02/05/2019  4:51 PM EST ----- Please increase synthroid to 175 mcg daily We will need repeat labs in 3-4 weeks ----- Message ----- From: Interface, [facility] Lab Results In Sent: 02/05/2019   4:38 PM EST To: Lars Masson, MD

## 2019-02-06 LAB — CBC
Hematocrit: 37.8 % (ref 34.0–46.6)
Hemoglobin: 12.8 g/dL (ref 11.1–15.9)
MCH: 33.3 pg — ABNORMAL HIGH (ref 26.6–33.0)
MCHC: 33.9 g/dL (ref 31.5–35.7)
MCV: 98 fL — ABNORMAL HIGH (ref 79–97)
Platelets: 169 10*3/uL (ref 150–450)
RBC: 3.84 x10E6/uL (ref 3.77–5.28)
RDW: 12.9 % (ref 11.7–15.4)
WBC: 5.4 10*3/uL (ref 3.4–10.8)

## 2019-02-06 LAB — COMPREHENSIVE METABOLIC PANEL
ALT: 11 IU/L (ref 0–32)
AST: 18 IU/L (ref 0–40)
Albumin/Globulin Ratio: 2 (ref 1.2–2.2)
Albumin: 4.5 g/dL (ref 3.6–4.6)
Alkaline Phosphatase: 59 IU/L (ref 39–117)
BUN/Creatinine Ratio: 25 (ref 12–28)
BUN: 32 mg/dL — ABNORMAL HIGH (ref 8–27)
Bilirubin Total: 0.5 mg/dL (ref 0.0–1.2)
CO2: 23 mmol/L (ref 20–29)
Calcium: 9.8 mg/dL (ref 8.7–10.3)
Chloride: 103 mmol/L (ref 96–106)
Creatinine, Ser: 1.26 mg/dL — ABNORMAL HIGH (ref 0.57–1.00)
GFR calc Af Amer: 46 mL/min/{1.73_m2} — ABNORMAL LOW (ref >59)
GFR calc non Af Amer: 40 mL/min/{1.73_m2} — ABNORMAL LOW (ref >59)
Globulin, Total: 2.3 g/dL (ref 1.5–4.5)
Glucose: 111 mg/dL — ABNORMAL HIGH (ref 65–99)
Potassium: 4.7 mmol/L (ref 3.5–5.2)
Sodium: 141 mmol/L (ref 134–144)
Total Protein: 6.8 g/dL (ref 6.0–8.5)

## 2019-02-06 LAB — TSH: TSH: 40.7 u[IU]/mL — ABNORMAL HIGH (ref 0.450–4.500)

## 2019-02-06 LAB — PRO B NATRIURETIC PEPTIDE: NT-Pro BNP: 110 pg/mL (ref 0–738)

## 2019-02-06 LAB — T4, FREE: Free T4: 0.78 ng/dL — ABNORMAL LOW (ref 0.82–1.77)

## 2019-02-06 LAB — T3, FREE: T3, Free: 1.3 pg/mL — ABNORMAL LOW (ref 2.0–4.4)

## 2019-02-10 ENCOUNTER — Encounter: Payer: Medicare Other | Admitting: Internal Medicine

## 2019-02-16 NOTE — Progress Notes (Signed)
Cardiology Office Note   Date:  02/18/2019   ID:  [name] [name], DOB 11/19/1935, MRN [id number]  Patient Location: Home Provider Location: Home  PCP:  [name], MD  Cardiologist:  Tobias Alexander, MD  Electrophysiologist: Dr Graciela Husbands  Evaluation Performed:  Follow-Up Visit  Reason for visit: 2 week follow-up  History of Present Illness:    [name] [name] is a 84 y.o. female with h/o nonischemic cardiomyopathy, left bundle branch block, nonsustained ventricular tachycardia, HTN and pancreatitis.   She underwent CRT Biotronik implantation  06/2014.  06/2016, she was  hospitalized and found to have staph bacteremia of (MSSA) in the wake of orthopedic surgery.  TEE at [facility] was reportedly negative. She was treated with  6 weeks of antibiotics  She developed recurrent 'septic arthritis" and bacteremia and TEE confirmed Vegetation on RA lead. She was transferred to [facility] for extraction which she tolerated well.   Rx with 8 weeks of IV Abx, most recently 10/18 keflex and rifampin.  Because of worsening left ventricular function and worsening functional status, she underwent CRT reimplantation 01/2017, aware that she would need long-term chronic suppressive antibiotics. Per. Dr. Odessa Fleming recent OV note, she has been considerably better following CRT implantation.  There is less shortness of breath and more energy.  Repeat echo has not yet been done.  03/23/2018 -this is 3 months follow-up, the patient has stable symptoms class IIb-III, she is now more independent as she needs to help her husband, still gets short of breath after short distances at home but is able to perform activities of daily living with some interruptions.  She has been compliant to medications.  Her primary care physician Dr Jeannetta Nap changed Synthroid that we prescribed to a different medication whose name she does not remember.  She denies any lower extremity edema orthopnea proximal nocturnal  dyspnea.  09/25/2018 - 2 months follow up, no labs since 03/23/18, she has been feeling well, walks 2-3 x/day without chest pain and no significant DOE. She has a blood work done suspicious for hemochromatosis (polycytemia, also FH - father diagnosed and daughter borderline), she was also diagnosed with CKD stage 4 and was taken off spironolactone. She feels well, no LE edema, orthopnea, PND.  02/03/2019 - 4 months follow up, she has noticed some new lower extremity edema, worsening dyspnea on exertion, and some episodes of proximal nocturnal dyspnea.  She denies any chest pain.  She has been compliant with her medications.  02/18/2019 - the patient is coming after two weeks and in person after a long time.  She has been experiencing some more dyspnea on exertion, more fatigue and had to add a pillow at night because of two-pillow orthopnea.  She has no lower extremity edema no palpitations, on 2 occasion she had exertional chest pain that resolved quickly at rest no dizziness or syncope.  She has been compliant with her medication and she has been taking Lasix 40 mg daily, we have also recently increased dose of Synthroid since her TSH was 40 Bose free T3 and free T4 were decreased.   Prior CV studies:   The following studies were reviewed today:  08/14/16: TEE Study Conclusions - Left ventricle: Systolic function was normal. The estimated ejection fraction was in the range of 55% to 60%. Wall motion was normal; there were no regional wall motion abnormalities. - Aortic valve: No evidence of vegetation. There was mild regurgitation. - Aorta: There was mild non-mobile atheroma. - Mitral valve: There  was mild regurgitation. - Left atrium: No evidence of thrombus in the atrial cavity or appendage. No evidence of thrombus in the atrial cavity or appendage. - Right atrium: No evidence of thrombus in the atrial cavity or appendage. No evidence of thrombus in the atrial cavity  or appendage. - Tricuspid valve: No evidence of vegetation. There was mild regurgitation. - Pulmonic valve: No evidence of vegetation. Impressions: - There is a highly mobile echodensity measuring 13 x 3 mm attached to the right atrial lead in the right atrium at the tricuspid valve level. This is consistent with pacemaker endocarditis.  10/17/15: limited echo for AV optimization Impressions: - The study is performed for AV optimization There is grade 1 diastolic dysfunction The optimal AV delay seems to be around 90-100 ms but i would defer to the doctors managing the pacer.  02/01/15: TTE Study Conclusions - Left ventricle: The cavity size was normal. There was moderate concentric hypertrophy. Systolic function was normal. The estimated ejection fraction was in the range of 50% to 55%. Wall motion was normal; there were no regional wall motion abnormalities. Doppler parameters are consistent with abnormal left ventricular relaxation (grade 1 diastolic dysfunction). The E/e&' ratio is >15, suggesting elevated LV filling pressure. - Mitral valve: Calcified annulus. Mildly thickened leaflets . There was trivial regurgitation. - Left atrium: The atrium was normal in size. - Right ventricle: The cavity size was normal. Wall thickness was normal. Pacer wire or catheter noted in right ventricle. Systolic function was normal. - Right atrium: The atrium was normal in size. Pacer wire or catheter noted in right atrium. - Tricuspid valve: There was moderate regurgitation. - Pulmonary arteries: PA peak pressure: 30 mm Hg (S). - Inferior vena cava: The vessel was normal in size. The respirophasic diameter changes were in the normal range (= 50%), consistent with normal central venous pressure. Impressions: - Compared to a prior study in 09/2014, the EF has improved to 50-55%.  The patient does not have symptoms concerning for COVID-19 infection  (fever, chills, cough, or new shortness of breath).   Past Medical History:  Diagnosis Date  . 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 Acute lower GI bleeding 05/06/2017  . Acute lower GI bleeding 05/06/2017  . Anemia 05/06/2017  . Arthritis    "finger joints" (06/21/2016)  . Asthma   . CHF (congestive heart failure) ([place])   . Congestive dilated cardiomyopathy ([place]) 03/31/2014   a. s/p Biotronik CRTD 06/2014  . GERD (gastroesophageal reflux disease)   . Headache   . History of blood transfusion 1990s   "related to hysterectomy"  . Hypothyroidism   . Left bundle branch block   . MSSA bacteremia 06/21/2016  . Myocardial infarction ([place])   . Pancreatitis, acute    Past Surgical History:  Procedure Laterality Date  . ABDOMINAL HYSTERECTOMY    . APPENDECTOMY    . BIV PACEMAKER INSERTION CRT-P N/A 01/17/2017   Procedure: BIV PACEMAKER INSERTION CRT-P;  Surgeon: Deboraha Sprang, MD;  Location: [facility];  Service: Cardiovascular;  Laterality: N/A;  . CARDIAC CATHETERIZATION    . CARDIAC DEFIBRILLATOR PLACEMENT  ?2016  . CATARACT EXTRACTION W/ INTRAOCULAR LENS  IMPLANT, BILATERAL Bilateral   . CHOLECYSTECTOMY OPEN     "related to pancreatitis"  . DILATION AND CURETTAGE OF UTERUS    . EP IMPLANTABLE DEVICE N/A 07/04/2014   Procedure: BiV ICD  Insertion CRT-D;  Surgeon: Duke Salvia, MD;  Location: [facility] INVASIVE CV LAB;  Service: Cardiovascular;  Laterality: N/A;  . ESOPHAGOGASTRODUODENOSCOPY (EGD) WITH PROPOFOL N/A 05/08/2017   Procedure: ESOPHAGOGASTRODUODENOSCOPY (EGD) WITH PROPOFOL;  Surgeon: Graylin Shiver, MD;  Location: [facility] ENDOSCOPY;  Service: Endoscopy;  Laterality: N/A;  . HERNIA REPAIR    . I & D KNEE WITH POLY EXCHANGE Right 08/11/2016   Procedure: IRRIGATION AND DEBRIDEMENT KNEE WITH POLY EXCHANGE;  Surgeon: Samson Frederic, MD;  Location: [facility] OR;  Service: Orthopedics;  Laterality: Right;   . INSERT / REPLACE / REMOVE PACEMAKER  ?2016  . JOINT REPLACEMENT Bilateral    "thumb joints; used my own material"  . JOINT REPLACEMENT    . LEFT HEART CATHETERIZATION WITH CORONARY ANGIOGRAM N/A 11/02/2013   Procedure: LEFT HEART CATHETERIZATION WITH CORONARY ANGIOGRAM;  Surgeon: Corky Crafts, MD;  Location: [facility] CATH LAB;  Service: Cardiovascular;  Laterality: N/A;  . LOOP RECORDER IMPLANT N/A 04/13/2014   Procedure: LOOP RECORDER IMPLANT;  Surgeon: Duke Salvia, MD;  Location: [facility] CATH LAB;  Service: Cardiovascular;  Laterality: N/A;  . LOOP RECORDER REMOVAL  ?2016  . POSTERIOR LUMBAR FUSION  05/03/2016   Lumbar Two-Three, Lumbar Three-Four Posterior Lumbar Fusion withLaminectomy and Foraminotomy  . SPINE SURGERY  04/2016   lumbar   . TEE WITHOUT CARDIOVERSION N/A 08/14/2016   Procedure: TRANSESOPHAGEAL ECHOCARDIOGRAM (TEE);  Surgeon: Lars Masson, MD;  Location: [facility] ENDOSCOPY;  Service: Cardiovascular;  Laterality: N/A;  . TONSILLECTOMY    . TOTAL HIP ARTHROPLASTY Bilateral   . UMBILICAL HERNIA REPAIR  1970s?     Current Meds  Medication Sig  . albuterol (PROAIR HFA) 108 (90 Base) MCG/ACT inhaler Inhale 2 puffs into the lungs every 6 (six) hours as needed for wheezing or shortness of breath.  . Aspirin-Acetaminophen-Caffeine (EXCEDRIN PO) Take 1 tablet by mouth as needed.  . betamethasone dipropionate (DIPROLENE) 0.05 % ointment Apply 1 application topically daily as needed.  . Calcium Carbonate Antacid (TUMS PO) Take 1 tablet by mouth daily as needed (indigestion).   . carvedilol (COREG) 12.5 MG tablet TAKE 1 TABLET TWICE A DAY  . cephALEXin (KEFLEX) 500 MG capsule Take 2 capsules (1,000 mg total) by mouth 2 (two) times daily.  [name] ENTRESTO 24-26 MG TAKE 1 TABLET TWICE A DAY  . feeding supplement, ENSURE ENLIVE, (ENSURE ENLIVE) LIQD Take 237 mLs by mouth 2 (two) times daily between meals.  . Fluticasone-Salmeterol (ADVAIR DISKUS) 250-50 MCG/DOSE AEPB Inhale 1 puff into the  lungs 2 (two) times daily.  . furosemide (LASIX) 20 MG tablet Take 1 tablet (20 mg total) by mouth daily.  [name] levothyroxine (SYNTHROID) 112 MCG tablet Take 112 mcg by mouth every morning.  . Melatonin 3 MG TABS Take 3 mg by mouth at bedtime as needed (for sleep.).   [name] methocarbamol (ROBAXIN) 500 MG tablet Take 1 tablet (500 mg total) by mouth every 6 (six) hours as needed for muscle spasms.  . methylphenidate (RITALIN) 20 MG tablet Take 40 mg by mouth daily.   March Rummage (ALLERGY EYE OP) Place 1-2 drops into both eyes daily as needed (for irritated eyes).  . naproxen (NAPROSYN) 500 MG tablet Take 500 mg by mouth 2 (two) times daily as needed.  . ondansetron (ZOFRAN) 8 MG tablet Take 8 mg by mouth as needed.  . pantoprazole (PROTONIX) 40 MG tablet Take 40 mg by mouth daily.  . polyethylene glycol (MIRALAX / GLYCOLAX) packet Take 17 g by mouth daily  as needed for mild constipation.  . rifampin (RIFADIN) 300 MG capsule TAKE 1 CAPSULE(300 MG) BY MOUTH EVERY 12 HOURS  . sucralfate (CARAFATE) 1 g tablet Take 1 g by mouth daily.     Allergies:   Sulfa antibiotics   Social History   Tobacco Use  . Smoking status: Never Smoker  . Smokeless tobacco: Never Used  . Tobacco comment: "smoked as a teenager; for maybe 1 wk"  Substance Use Topics  . Alcohol use: Yes    Comment: 6/15//2018 "glass of wine q 6 months or so"  . Drug use: No     Family Hx: The patient's family history includes Arrhythmia in her mother; Heart attack in her father, maternal grandfather, and paternal grandfather; Heart disease in her father; Hyperlipidemia in her mother.  ROS:   Please see the history of present illness.    All other systems reviewed and are negative.  Labs/Other Tests and Data Reviewed:    EKG:  No ECG reviewed.  Recent Labs: 02/05/2019: ALT 11; BUN 32; Creatinine, Ser 1.26; Hemoglobin 12.8; NT-Pro BNP 110; Platelets 169; Potassium 4.7; Sodium 141; TSH 40.700   Recent Lipid Panel Lab  Results  Component Value Date/Time   CHOL 171 05/06/2017 03:51 PM   TRIG 127 05/06/2017 03:51 PM   HDL 43 05/06/2017 03:51 PM   CHOLHDL 4.0 05/06/2017 03:51 PM   LDLCALC 103 (H) 05/06/2017 03:51 PM   Wt Readings from Last 3 Encounters:  02/18/19 165 lb 12.8 oz (75.2 kg)  02/03/19 162 lb (73.5 kg)  11/02/18 156 lb (70.8 kg)    Objective:    Vital Signs:  BP 132/80   Pulse 71   Ht 4\' 10"  (1.473 m)   Wt 165 lb 12.8 oz (75.2 kg)   LMP  (LMP Unknown)   SpO2 99%   BMI 34.65 kg/m    VITAL SIGNS:  reviewed     ASSESSMENT & PLAN:    Acute on chronic chronic systolic HF:  -OptiVol showed increased volume in early to mid January, with increased Lasix 40 mg daily, it came back down to normal, creatinine slightly up from 0.99-1.26, we will decrease Lasix to 20 mg daily, will obtain repeat BMP today and adjust as needed based on results.  The patient continues to feel slightly more short of breath and has two-pillow orthopnea, we will obtain limited echocardiogram today in the clinic to reevaluate her LVEF.  She is encouraged to continue walking daily.  Hypothyroidism -On February 05, 2019 TSH 48, free T3 1.3 and free T4 0.78, the adjusted her Synthroid, we will recheck today.  Nonischemic cardiomyopathy - LVEF 35 to 40% on the most recent echocardiogram from January 2020, initially improvement to 50 to 55% with a BiV ICD, however her device get infected had to be removed she received another device in January 2019, post explantation her LVEF dropped to 30 to 35% now improved to 35 to 40%. -She is on Coreg 12.5 mg p.o. twice daily, Entresto 24/26 mg, she could not tolerate spironolactone. -We will repeat today.  BiV/ICD: history outline above in HPI. Required extraction due to bacteremia/lead vegetation. Underwent re-implantation 01/2017.  Followed by Dr. 02/2017. Functionally she seemed to be doing quite well following CRT implantation.  QRS is considerably narrowed.    COVID-19  Education: The signs and symptoms of COVID-19 were discussed with the patient and how to seek care for testing (follow up with PCP or arrange E-visit).  The importance of social distancing was  discussed today.  Time:   Today, I have spent 25 minutes with the patient with telehealth technology discussing the above problems.    Medication Adjustments/Labs and Tests Ordered: Current medicines are reviewed at length with the patient today.  Concerns regarding medicines are outlined above.   Tests Ordered: Orders Placed This Encounter  Procedures  . Basic metabolic panel  . TSH  . ECHOCARDIOGRAM LIMITED    Medication Changes: No orders of the defined types were placed in this encounter.   Disposition:  Follow up in 4 month(s)  Signed, Tobias Alexander, MD  02/18/2019 12:53 PM    [facility]

## 2019-02-17 ENCOUNTER — Encounter: Payer: Self-pay | Admitting: Cardiology

## 2019-02-17 NOTE — Telephone Encounter (Signed)
Error

## 2019-02-18 ENCOUNTER — Ambulatory Visit (INDEPENDENT_AMBULATORY_CARE_PROVIDER_SITE_OTHER): Payer: Medicare Other | Admitting: Cardiology

## 2019-02-18 ENCOUNTER — Encounter: Payer: Self-pay | Admitting: Cardiology

## 2019-02-18 ENCOUNTER — Ambulatory Visit (HOSPITAL_COMMUNITY): Payer: Medicare Other | Attending: Cardiology

## 2019-02-18 ENCOUNTER — Other Ambulatory Visit: Payer: Self-pay

## 2019-02-18 VITALS — BP 132/80 | HR 71 | Ht <= 58 in | Wt 165.8 lb

## 2019-02-18 DIAGNOSIS — Z950 Presence of cardiac pacemaker: Secondary | ICD-10-CM | POA: Diagnosis not present

## 2019-02-18 DIAGNOSIS — R002 Palpitations: Secondary | ICD-10-CM

## 2019-02-18 DIAGNOSIS — Z79899 Other long term (current) drug therapy: Secondary | ICD-10-CM

## 2019-02-18 DIAGNOSIS — I5022 Chronic systolic (congestive) heart failure: Secondary | ICD-10-CM | POA: Insufficient documentation

## 2019-02-18 DIAGNOSIS — I428 Other cardiomyopathies: Secondary | ICD-10-CM | POA: Diagnosis present

## 2019-02-18 DIAGNOSIS — I5043 Acute on chronic combined systolic (congestive) and diastolic (congestive) heart failure: Secondary | ICD-10-CM | POA: Diagnosis present

## 2019-02-18 DIAGNOSIS — T827XXA Infection and inflammatory reaction due to other cardiac and vascular devices, implants and grafts, initial encounter: Secondary | ICD-10-CM | POA: Diagnosis not present

## 2019-02-18 DIAGNOSIS — I447 Left bundle-branch block, unspecified: Secondary | ICD-10-CM

## 2019-02-18 DIAGNOSIS — Z95810 Presence of automatic (implantable) cardiac defibrillator: Secondary | ICD-10-CM

## 2019-02-18 LAB — ECHOCARDIOGRAM LIMITED
Height: 58 in
Weight: 2652.8 oz

## 2019-02-18 NOTE — Patient Instructions (Signed)
Medication Instructions:   TAKE LASIX (FUROSEMIDE) 20 MG BY MOUTH DAILY  *If you need a refill on your cardiac medications before your next appointment, please call your pharmacy*   Lab Work:  TODAY--BMET AND TSH LEVEL If you have labs (blood work) drawn today and your tests are completely normal, you will receive your results only by: [name] MyChart Message (if you have MyChart) OR . A paper copy in the mail If you have any lab test that is abnormal or we need to change your treatment, we will call you to review the results.   Testing/Procedures:  Your physician has requested that you have an limited echocardiogram. Echocardiography is a painless test that uses sound waves to create images of your heart. It provides your doctor with information about the size and shape of your heart and how well your heart's chambers and valves are working. This procedure takes approximately one hour. There are no restrictions for this procedure.  TODAY    Follow-Up: At [facility], you and your health needs are our priority.  As part of our continuing mission to provide you with exceptional heart care, we have created designated Provider Care Teams.  These Care Teams include your primary Cardiologist (physician) and Advanced Practice Providers (APPs -  Physician Assistants and Nurse Practitioners) who all work together to provide you with the care you need, when you need it.  Your next appointment:   3 WITH DR. Delton See IN THE OFFICE ON 05/20/19 AT 10:20 AM  The format for your next appointment:   In Person  Provider:   Tobias Alexander, MD

## 2019-02-19 ENCOUNTER — Telehealth: Payer: Self-pay | Admitting: *Deleted

## 2019-02-19 DIAGNOSIS — I5043 Acute on chronic combined systolic (congestive) and diastolic (congestive) heart failure: Secondary | ICD-10-CM

## 2019-02-19 DIAGNOSIS — Z79899 Other long term (current) drug therapy: Secondary | ICD-10-CM

## 2019-02-19 DIAGNOSIS — R7989 Other specified abnormal findings of blood chemistry: Secondary | ICD-10-CM

## 2019-02-19 LAB — BASIC METABOLIC PANEL
BUN/Creatinine Ratio: 24 (ref 12–28)
BUN: 35 mg/dL — ABNORMAL HIGH (ref 8–27)
CO2: 22 mmol/L (ref 20–29)
Calcium: 9.6 mg/dL (ref 8.7–10.3)
Chloride: 100 mmol/L (ref 96–106)
Creatinine, Ser: 1.45 mg/dL — ABNORMAL HIGH (ref 0.57–1.00)
GFR calc Af Amer: 39 mL/min/{1.73_m2} — ABNORMAL LOW (ref >59)
GFR calc non Af Amer: 34 mL/min/{1.73_m2} — ABNORMAL LOW (ref >59)
Glucose: 96 mg/dL (ref 65–99)
Potassium: 3.8 mmol/L (ref 3.5–5.2)
Sodium: 141 mmol/L (ref 134–144)

## 2019-02-19 LAB — TSH: TSH: 31.8 u[IU]/mL — ABNORMAL HIGH (ref 0.450–4.500)

## 2019-02-19 MED ORDER — FUROSEMIDE 20 MG PO TABS: 20.0000 mg | ORAL_TABLET | ORAL | 1 refills | Status: DC

## 2019-02-19 NOTE — Telephone Encounter (Signed)
-----   Message from Lars Masson, MD sent at 02/19/2019  2:17 PM EST ----- TSH is improving but still abnormal, continue the current dose of synthroid, switch lasix 20 mg po daily to 20 mg po every other day _Tu, TH and Sa. Please repeat TSH, fT3,4, BMP and BNP in 4 weeks.

## 2019-02-19 NOTE — Telephone Encounter (Signed)
Spoke with the pt and informed her that per Dr. Delton See, based on her labs, her TSH is improving but still abnormal, so she recommends that she continue her current dose of synthroid.  Also informed the pt that per Dr. Delton See, based on her other labs, we now need to decrease her lasix to 20 mg po every other day, take on Tuesdays, Thursdays, and Saturdays, and repeat her TSH, Free T3, Free T4, BMET, and PRO-BNP in 4 weeks.  Confirmed the pharmacy of choice with the pt.  Note placed to the pharmacy that pt will call for further refills, for she has enough on hand at this time.  Scheduled the pt to come back for repeat labs in 4 weeks on 03/19/19.  Pt verbalized understanding and agrees with this plan.

## 2019-02-22 ENCOUNTER — Other Ambulatory Visit (INDEPENDENT_AMBULATORY_CARE_PROVIDER_SITE_OTHER): Payer: Medicare Other | Admitting: *Deleted

## 2019-02-22 DIAGNOSIS — I5022 Chronic systolic (congestive) heart failure: Secondary | ICD-10-CM

## 2019-02-22 DIAGNOSIS — T827XXA Infection and inflammatory reaction due to other cardiac and vascular devices, implants and grafts, initial encounter: Secondary | ICD-10-CM

## 2019-02-22 DIAGNOSIS — R002 Palpitations: Secondary | ICD-10-CM

## 2019-02-22 DIAGNOSIS — I447 Left bundle-branch block, unspecified: Secondary | ICD-10-CM

## 2019-02-22 DIAGNOSIS — Z950 Presence of cardiac pacemaker: Secondary | ICD-10-CM

## 2019-02-22 DIAGNOSIS — I5043 Acute on chronic combined systolic (congestive) and diastolic (congestive) heart failure: Secondary | ICD-10-CM

## 2019-02-22 DIAGNOSIS — Z95810 Presence of automatic (implantable) cardiac defibrillator: Secondary | ICD-10-CM

## 2019-02-22 DIAGNOSIS — R072 Precordial pain: Secondary | ICD-10-CM

## 2019-02-24 ENCOUNTER — Ambulatory Visit: Payer: Medicare Other | Admitting: Cardiology

## 2019-02-24 ENCOUNTER — Other Ambulatory Visit: Payer: Medicare Other

## 2019-02-28 IMAGING — DX DG FOOT COMPLETE 3+V*R*
3 series · 3 of 3 positions shown · non-contrast
Comparison: None.

CLINICAL DATA: Pain and swelling

EXAM:
RIGHT FOOT COMPLETE - 3+ VIEW

[dg foot complete right (1 of 3)]
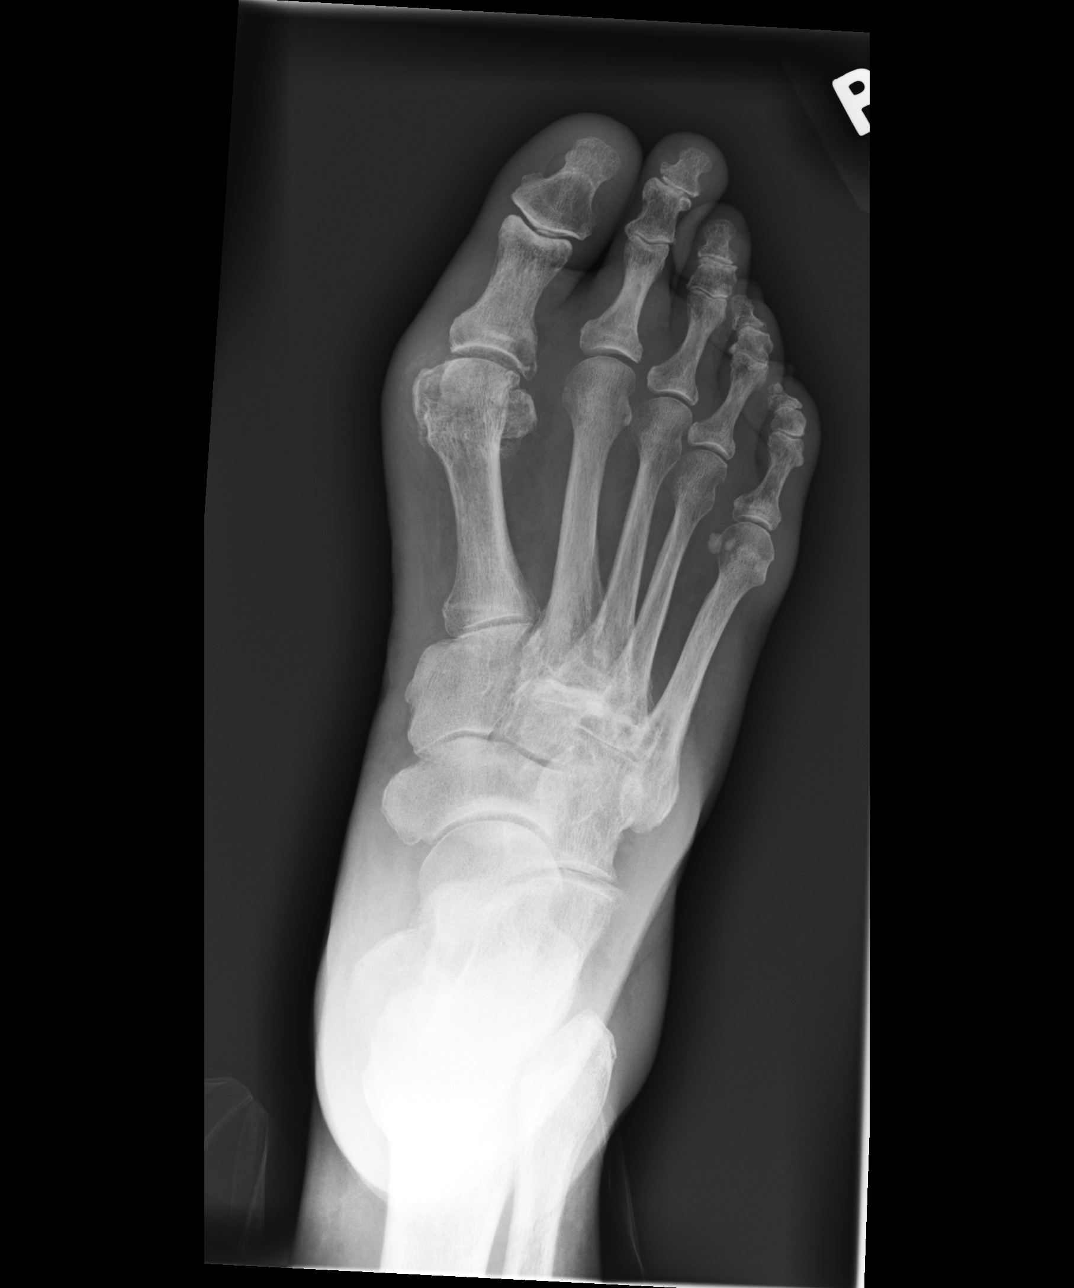

[dg foot complete right (2 of 3)]
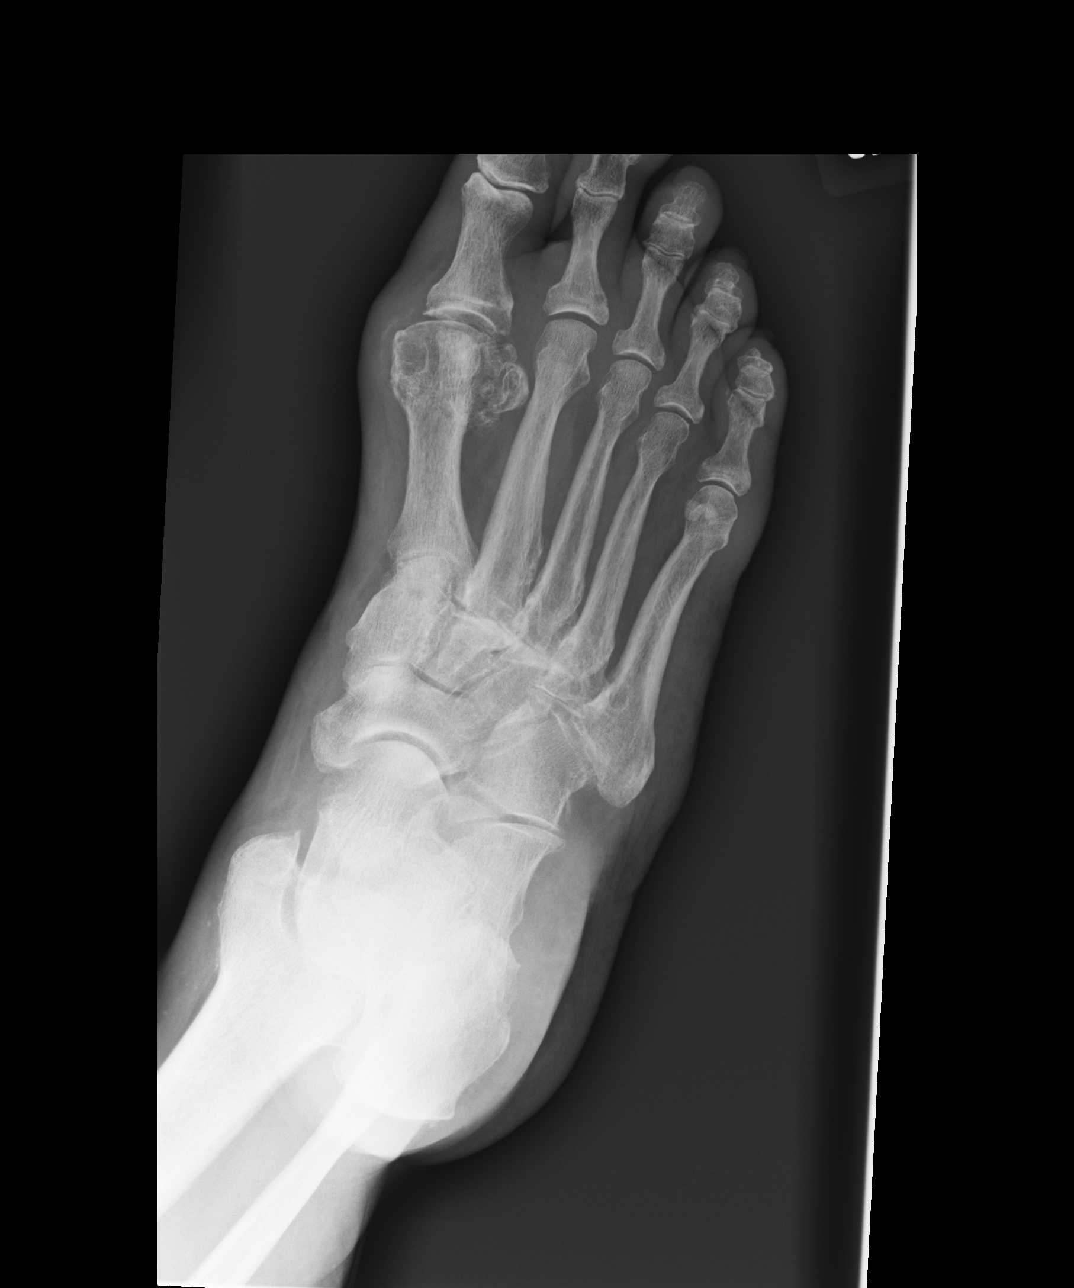

[dg foot complete right (3 of 3)]
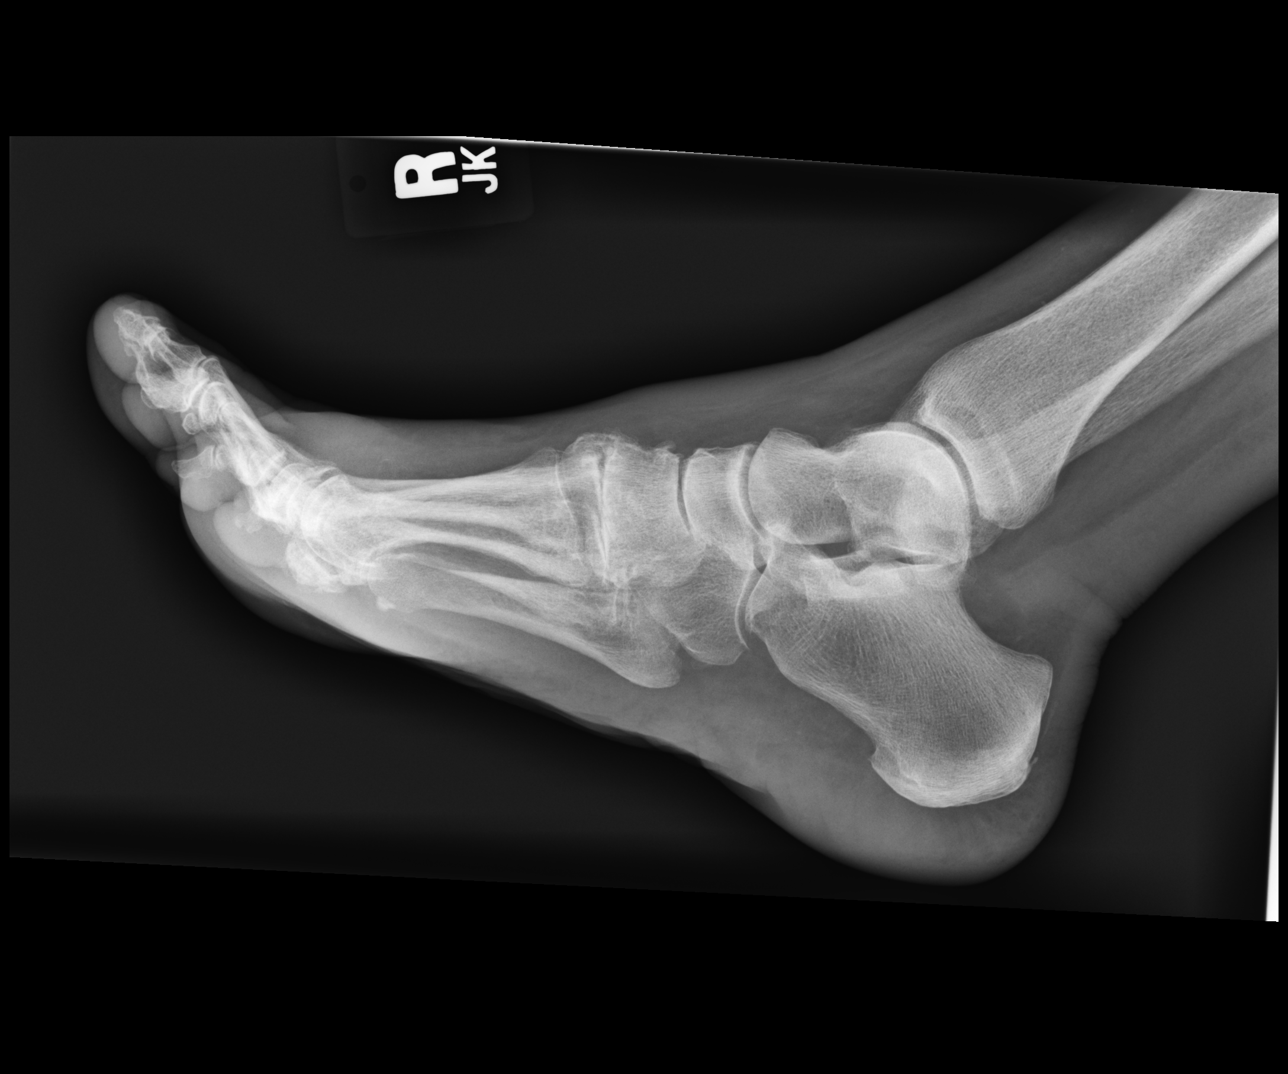

[3 of 3 positions shown; findings below may reference images not displayed]

FINDINGS: Frontal, lateral, and oblique views obtained. There is mild soft
tissue swelling. There is no evident fracture or dislocation. There
is osteoarthritic change in the first MTP joint with mild hallux
valgus deformity at the first MTP joint. There is intra-articular
calcification adjacent to the medial aspect of the distal first
metatarsal and medial aspect first MTP joint. There is
osteoarthritic change elsewhere in all PIP and DIP joints. There is
spurring in the dorsal midfoot. There are minimal posterior and
inferior calcaneal spurs. No erosive change.
IMPRESSION: Multilevel osteoarthritic change, most marked in the first MTP
joint. No fracture or dislocation. Mild generalized soft tissue
swelling. Minimal calcaneal spurs noted.

## 2019-03-08 ENCOUNTER — Other Ambulatory Visit: Payer: Self-pay

## 2019-03-08 ENCOUNTER — Ambulatory Visit (INDEPENDENT_AMBULATORY_CARE_PROVIDER_SITE_OTHER): Payer: Medicare Other | Admitting: Internal Medicine

## 2019-03-08 ENCOUNTER — Encounter: Payer: Self-pay | Admitting: Internal Medicine

## 2019-03-08 VITALS — BP 132/78 | HR 72 | Ht <= 58 in | Wt 167.0 lb

## 2019-03-08 DIAGNOSIS — I447 Left bundle-branch block, unspecified: Secondary | ICD-10-CM

## 2019-03-08 DIAGNOSIS — I5022 Chronic systolic (congestive) heart failure: Secondary | ICD-10-CM

## 2019-03-08 DIAGNOSIS — Z95810 Presence of automatic (implantable) cardiac defibrillator: Secondary | ICD-10-CM | POA: Diagnosis not present

## 2019-03-08 DIAGNOSIS — I428 Other cardiomyopathies: Secondary | ICD-10-CM

## 2019-03-08 LAB — CUP PACEART INCLINIC DEVICE CHECK
Battery Remaining Longevity: 97 mo
Battery Voltage: 2.99 V
Brady Statistic AP VP Percent: 15.53 %
Brady Statistic AP VS Percent: 0.23 %
Brady Statistic AS VP Percent: 81.73 %
Brady Statistic AS VS Percent: 2.52 %
Brady Statistic RA Percent Paced: 16.32 %
Brady Statistic RV Percent Paced: 16.76 %
Date Time Interrogation Session: 20210301132300
Implantable Lead Implant Date: 20190111
Implantable Lead Implant Date: 20190111
Implantable Lead Implant Date: 20190111
Implantable Lead Location: 753858
Implantable Lead Location: 753859
Implantable Lead Location: 753860
Implantable Lead Model: 5076
Implantable Lead Model: 5076
Implantable Pulse Generator Implant Date: 20190111
Lead Channel Impedance Value: 1539 Ohm
Lead Channel Impedance Value: 1577 Ohm
Lead Channel Impedance Value: 1596 Ohm
Lead Channel Impedance Value: 1653 Ohm
Lead Channel Impedance Value: 1672 Ohm
Lead Channel Impedance Value: 1672 Ohm
Lead Channel Impedance Value: 399 Ohm
Lead Channel Impedance Value: 418 Ohm
Lead Channel Impedance Value: 437 Ohm
Lead Channel Impedance Value: 513 Ohm
Lead Channel Impedance Value: 798 Ohm
Lead Channel Impedance Value: 893 Ohm
Lead Channel Impedance Value: 912 Ohm
Lead Channel Impedance Value: 931 Ohm
Lead Channel Pacing Threshold Amplitude: 0.5 V
Lead Channel Pacing Threshold Amplitude: 0.75 V
Lead Channel Pacing Threshold Amplitude: 2 V
Lead Channel Pacing Threshold Pulse Width: 0.4 ms
Lead Channel Pacing Threshold Pulse Width: 0.4 ms
Lead Channel Pacing Threshold Pulse Width: 0.4 ms
Lead Channel Sensing Intrinsic Amplitude: 1.25 mV
Lead Channel Sensing Intrinsic Amplitude: 27.25 mV
Lead Channel Setting Pacing Amplitude: 2 V
Lead Channel Setting Pacing Amplitude: 2.5 V
Lead Channel Setting Pacing Amplitude: 2.5 V
Lead Channel Setting Pacing Pulse Width: 0.4 ms
Lead Channel Setting Pacing Pulse Width: 1 ms
Lead Channel Setting Sensing Sensitivity: 0.9 mV

## 2019-03-08 MED ORDER — FUROSEMIDE 40 MG PO TABS: 40.0000 mg | ORAL_TABLET | Freq: Every day | ORAL | 3 refills | Status: DC

## 2019-03-08 MED ORDER — FUROSEMIDE 40 MG PO TABS: ORAL_TABLET | ORAL | 3 refills | Status: DC

## 2019-03-08 NOTE — Patient Instructions (Addendum)
Medication Instructions:   Your physician has recommended you make the following change in your medication:  Increase you Furosemide to 40mg  (2 of your 20mg  tablets) by mouth daily x 3 days.  Then 40mg  (2 of your 20mg  tablets) by mouth every other day.   Once you receive you receive your new prescription of Furosemide 40mg  tablets you will take 1 tablet every other day.  Labwork: None ordered.  Testing/Procedures: None ordered.  Follow-Up: Your physician wants you to follow-up in:one year with Dr . You will receive a reminder letter in the mail two months in advance. If you don't receive a letter, please call our office to schedule the follow-up appointment.  Remote monitoring is used to monitor your Pacemaker of ICD from home. This monitoring reduces the number of office visits required to check your device to one time per year. It allows to keep an eye on the functioning of your device to ensure it is working properly.  Any Other Special Instructions Will Be Listed Below (If Applicable).  If you need a refill on your cardiac medications before your next appointment, please call your pharmacy.

## 2019-03-08 NOTE — Progress Notes (Signed)
Patient Care Team: Leonard Downing, MD as PCP - General (Family Medicine) Dorothy Spark, MD as PCP - Cardiology (Cardiology)   HPI  [name] [name] is a 84 y.o. female Seen in follow-up for nonischemic cardiomyopathy left bundle branch block and nonsustained ventricular tachycardia.   She underwent CRT Biotronik implantation  6/16.  6/18  hospitalized and found to have staph bacteremia of (MSSA) in the wake of orthopedic surgery  TEE at [facility] was reportedly negative. She was treated with  6 weeks of antibiotics  She developed recurrent 'septic arthritis" and bacteremia and TEE>>Vegetation on RA lead.  Transferred to [facility] for extraction which she tolerated well   Rx with 8 weeks of IV Abx, most recently 10/18 keflex and rifampin; these are anticipated to last at least 6 months   Because of worsening left ventricular function and worsening functional status she underwent CRT reimplantation 1/19 aware that she would need long-term chronic suppressive antibiotics   She has been doing relatively well.  However, she has more recently noted more orthopnea now on 3 pillows.  Some nocturnal dyspnea.  No peripheral edema but abdominal swelling.  Denies chest pain.  Her diet is deplete of sodium but replete of fluid     DATE TEST EF   6/16 Echo  25 %   1/17 Echo   55-60 %   10/18 Echo   30%  Mild AS Mod MR   1/20 Echo  35-40%   2/21 Echo  30-35% MR trivial    Date Cr K Hgb  1/19 0.97 4.4 12.02  1/21 1.45 3.8 12.3       Past Medical History:  Diagnosis Date  . 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 Acute lower GI bleeding 05/06/2017  . Acute lower GI bleeding 05/06/2017  . Anemia 05/06/2017  . Arthritis    "finger joints" (06/21/2016)  . Asthma   . CHF (congestive heart failure) ([place])   . Congestive dilated cardiomyopathy ([place]) 03/31/2014   a. s/p Biotronik CRTD  06/2014  . GERD (gastroesophageal reflux disease)   . Headache   . History of blood transfusion 1990s   "related to hysterectomy"  . Hypothyroidism   . Left bundle branch block   . MSSA bacteremia 06/21/2016  . Myocardial infarction ([place])   . Pancreatitis, acute     Past Surgical History:  Procedure Laterality Date  . ABDOMINAL HYSTERECTOMY    . APPENDECTOMY    . BIV PACEMAKER INSERTION CRT-P N/A 01/17/2017   Procedure: BIV PACEMAKER INSERTION CRT-P;  Surgeon: Deboraha Sprang, MD;  Location: [place] [facility];  Service: Cardiovascular;  Laterality: N/A;  . CARDIAC CATHETERIZATION    . CARDIAC DEFIBRILLATOR PLACEMENT  ?2016  . CATARACT EXTRACTION W/ INTRAOCULAR LENS  IMPLANT, BILATERAL Bilateral   . CHOLECYSTECTOMY OPEN     "related to pancreatitis"  . DILATION AND CURETTAGE OF UTERUS    . EP IMPLANTABLE DEVICE N/A 07/04/2014   Procedure: BiV ICD Insertion CRT-D;  Surgeon: Deboraha Sprang, MD;  Location: [place] [facility];  Service: Cardiovascular;  Laterality: N/A;  . ESOPHAGOGASTRODUODENOSCOPY (EGD) WITH PROPOFOL N/A 05/08/2017   Procedure: ESOPHAGOGASTRODUODENOSCOPY (EGD) WITH PROPOFOL;  Surgeon: Wonda Horner, MD;  Location: [facility] ENDOSCOPY;  Service: Endoscopy;  Laterality: N/A;  . HERNIA REPAIR    . I & D KNEE WITH POLY EXCHANGE Right  08/11/2016   Procedure: IRRIGATION AND DEBRIDEMENT KNEE WITH POLY EXCHANGE;  Surgeon: Samson Frederic, MD;  Location: [facility] OR;  Service: Orthopedics;  Laterality: Right;  . INSERT / REPLACE / REMOVE PACEMAKER  ?2016  . JOINT REPLACEMENT Bilateral    "thumb joints; used my own material"  . JOINT REPLACEMENT    . LEFT HEART CATHETERIZATION WITH CORONARY ANGIOGRAM N/A 11/02/2013   Procedure: LEFT HEART CATHETERIZATION WITH CORONARY ANGIOGRAM;  Surgeon: Corky Crafts, MD;  Location: [facility] CATH LAB;  Service: Cardiovascular;  Laterality: N/A;  . LOOP RECORDER IMPLANT N/A 04/13/2014   Procedure: LOOP RECORDER IMPLANT;  Surgeon: Duke Salvia, MD;  Location:  [facility] CATH LAB;  Service: Cardiovascular;  Laterality: N/A;  . LOOP RECORDER REMOVAL  ?2016  . POSTERIOR LUMBAR FUSION  05/03/2016   Lumbar Two-Three, Lumbar Three-Four Posterior Lumbar Fusion withLaminectomy and Foraminotomy  . SPINE SURGERY  04/2016   lumbar   . TEE WITHOUT CARDIOVERSION N/A 08/14/2016   Procedure: TRANSESOPHAGEAL ECHOCARDIOGRAM (TEE);  Surgeon: Lars Masson, MD;  Location: [facility] ENDOSCOPY;  Service: Cardiovascular;  Laterality: N/A;  . TONSILLECTOMY    . TOTAL HIP ARTHROPLASTY Bilateral   . UMBILICAL HERNIA REPAIR  1970s?    Current Outpatient Medications  Medication Sig Dispense Refill  . albuterol (PROAIR HFA) 108 (90 Base) MCG/ACT inhaler Inhale 2 puffs into the lungs every 6 (six) hours as needed for wheezing or shortness of breath.    . Aspirin-Acetaminophen-Caffeine (EXCEDRIN PO) Take 1 tablet by mouth as needed.    . betamethasone dipropionate (DIPROLENE) 0.05 % ointment Apply 1 application topically daily as needed.    . Calcium Carbonate Antacid (TUMS PO) Take 1 tablet by mouth daily as needed (indigestion).     . carvedilol (COREG) 12.5 MG tablet TAKE 1 TABLET TWICE A DAY 180 tablet 3  . cephALEXin (KEFLEX) 500 MG capsule Take 2 capsules (1,000 mg total) by mouth 2 (two) times daily. 120 capsule 12  . ENTRESTO 24-26 MG TAKE 1 TABLET TWICE A DAY 180 tablet 3  . feeding supplement, ENSURE ENLIVE, (ENSURE ENLIVE) LIQD Take 237 mLs by mouth 2 (two) times daily between meals. 237 mL 12  . Fluticasone-Salmeterol (ADVAIR DISKUS) 250-50 MCG/DOSE AEPB Inhale 1 puff into the lungs 2 (two) times daily. 60 each 3  . furosemide (LASIX) 20 MG tablet Take 1 tablet (20 mg total) by mouth every other day. Take on Tuesday, Thursday, and Saturdays. 30 tablet 1  . levothyroxine (SYNTHROID) 112 MCG tablet Take 112 mcg by mouth every morning.    . Melatonin 3 MG TABS Take 3 mg by mouth at bedtime as needed (for sleep.).     [name] methocarbamol (ROBAXIN) 500 MG tablet Take 1 tablet (500  mg total) by mouth every 6 (six) hours as needed for muscle spasms. 10 tablet 0  . methylphenidate (RITALIN) 20 MG tablet Take 40 mg by mouth daily.     March Rummage (ALLERGY EYE OP) Place 1-2 drops into both eyes daily as needed (for irritated eyes).    . naproxen (NAPROSYN) 500 MG tablet Take 500 mg by mouth 2 (two) times daily as needed.    . ondansetron (ZOFRAN) 8 MG tablet Take 8 mg by mouth as needed.    . pantoprazole (PROTONIX) 40 MG tablet Take 40 mg by mouth daily.    . polyethylene glycol (MIRALAX / GLYCOLAX) packet Take 17 g by mouth daily as needed for mild constipation. 14 each 0  . rifampin (RIFADIN) 300 MG  capsule TAKE 1 CAPSULE(300 MG) BY MOUTH EVERY 12 HOURS 60 capsule 12  . sucralfate (CARAFATE) 1 g tablet Take 1 g by mouth daily.    . nitroGLYCERIN (NITROSTAT) 0.4 MG SL tablet Place 1 tablet (0.4 mg total) every 5 (five) minutes as needed under the tongue for chest pain. 90 tablet 3   No current facility-administered medications for this visit.    Allergies  Allergen Reactions  . Sulfa Antibiotics Hives    Review of Systems negative except from HPI and PMH  Physical Exam BP 132/78   Pulse 72   Ht 4\' 10"  (1.473 m)   Wt 167 lb (75.8 kg)   LMP  (LMP Unknown)   SpO2 99%   BMI 34.90 kg/m  Well developed and well nourished in no acute distress HENT normal Neck supple with JVP-flat Clear Device pocket well healed; without hematoma or erythema.  There is no tethering  Regular rate and rhythm, no  * murmur Abd-soft with active BS No Clubbing cyanosis edema Skin-warm and dry A & Oriented  Grossly normal sensory and motor function  ECG sinus with P synchronous pacing QRS duration 140 ms (predevice 200 ms) QRS morphology is negative lead V1 and R/S lead I  Assessment and  Plan  Nonischemic cardiomyopathy    Left bundle branch block  CRT-D  reimplantation  Congestive heart failure class III  Hypertension  Septic Arthritis--chronic antibiotic  suppression     Blood pressures well controlled.  Volume overloaded manifested by orthopnea.  Responded appropriately with Dr. recent up titration of diuretics.  She is now back to her baseline and we will increase her chronically going forward, aware of her renal issues  For now we will go to Lasix 40 daily x3 days and then 40 every other day.  Blood work is scheduled for next Friday.

## 2019-03-19 ENCOUNTER — Other Ambulatory Visit: Payer: Self-pay

## 2019-03-19 ENCOUNTER — Other Ambulatory Visit: Payer: Medicare Other | Admitting: *Deleted

## 2019-03-19 DIAGNOSIS — I5043 Acute on chronic combined systolic (congestive) and diastolic (congestive) heart failure: Secondary | ICD-10-CM

## 2019-03-19 DIAGNOSIS — Z79899 Other long term (current) drug therapy: Secondary | ICD-10-CM

## 2019-03-19 DIAGNOSIS — R7989 Other specified abnormal findings of blood chemistry: Secondary | ICD-10-CM

## 2019-03-20 LAB — BASIC METABOLIC PANEL
BUN/Creatinine Ratio: 25 (ref 12–28)
BUN: 26 mg/dL (ref 8–27)
CO2: 22 mmol/L (ref 20–29)
Calcium: 9.3 mg/dL (ref 8.7–10.3)
Chloride: 103 mmol/L (ref 96–106)
Creatinine, Ser: 1.06 mg/dL — ABNORMAL HIGH (ref 0.57–1.00)
GFR calc Af Amer: 57 mL/min/{1.73_m2} — ABNORMAL LOW (ref >59)
GFR calc non Af Amer: 49 mL/min/{1.73_m2} — ABNORMAL LOW (ref >59)
Glucose: 101 mg/dL — ABNORMAL HIGH (ref 65–99)
Potassium: 4.6 mmol/L (ref 3.5–5.2)
Sodium: 143 mmol/L (ref 134–144)

## 2019-03-20 LAB — T4, FREE: Free T4: 1.24 ng/dL (ref 0.82–1.77)

## 2019-03-20 LAB — TSH: TSH: 31.2 u[IU]/mL — ABNORMAL HIGH (ref 0.450–4.500)

## 2019-03-20 LAB — T3, FREE: T3, Free: 1.9 pg/mL — ABNORMAL LOW (ref 2.0–4.4)

## 2019-03-20 LAB — PRO B NATRIURETIC PEPTIDE: NT-Pro BNP: 136 pg/mL (ref 0–738)

## 2019-03-23 ENCOUNTER — Telehealth: Payer: Self-pay | Admitting: *Deleted

## 2019-03-23 DIAGNOSIS — R7989 Other specified abnormal findings of blood chemistry: Secondary | ICD-10-CM

## 2019-03-23 DIAGNOSIS — E039 Hypothyroidism, unspecified: Secondary | ICD-10-CM

## 2019-03-23 MED ORDER — LEVOTHYROXINE SODIUM 125 MCG PO TABS: 125.0000 ug | ORAL_TABLET | Freq: Every day | ORAL | 1 refills | Status: DC

## 2019-03-23 NOTE — Telephone Encounter (Signed)
Informed the pt of her lab results and recommendations per Dr. Delton See. Informed the pt that per Dr. Delton See, her TSH is still elevated, so she recommends that we increase her synthroid now to 125 mcg po daily before breakfast, and repeat TSH, Free T3, and Free T4 in 4 weeks.  Confirmed the pharmacy of choice with the pt.  Informed the pt that I will send in only a months supply of her synthroid, incase this needs to be changed again when she comes back for repeat labs in 4 weeks.  Scheduled the pt for repeat labs to check TSH, Free T3, and Free T4 in 4 weeks on 04/20/19.  Pt verbalized understanding and agrees with this plan.

## 2019-03-23 NOTE — Telephone Encounter (Signed)
-----   Message from Lars Masson, MD sent at 03/20/2019 12:56 PM EST ----- Please increase synthroid to 125 mcg daily and repeat TSH, fT3,4 in 4 weeks, thank you

## 2019-04-17 ENCOUNTER — Other Ambulatory Visit: Payer: Self-pay | Admitting: Cardiology

## 2019-04-17 DIAGNOSIS — R7989 Other specified abnormal findings of blood chemistry: Secondary | ICD-10-CM

## 2019-04-17 DIAGNOSIS — E039 Hypothyroidism, unspecified: Secondary | ICD-10-CM

## 2019-04-19 NOTE — Telephone Encounter (Signed)
We are waiting for her repeat TSH on 4/16 before sending to 90 day supplier. Pt has a refill of this medication at her local pharmacy.

## 2019-04-20 ENCOUNTER — Other Ambulatory Visit: Payer: Medicare Other

## 2019-04-21 ENCOUNTER — Other Ambulatory Visit: Payer: Self-pay | Admitting: Cardiology

## 2019-04-21 DIAGNOSIS — I428 Other cardiomyopathies: Secondary | ICD-10-CM

## 2019-04-21 DIAGNOSIS — Z95810 Presence of automatic (implantable) cardiac defibrillator: Secondary | ICD-10-CM

## 2019-04-21 DIAGNOSIS — Z950 Presence of cardiac pacemaker: Secondary | ICD-10-CM

## 2019-04-21 DIAGNOSIS — R0602 Shortness of breath: Secondary | ICD-10-CM

## 2019-04-21 DIAGNOSIS — I5022 Chronic systolic (congestive) heart failure: Secondary | ICD-10-CM

## 2019-04-23 ENCOUNTER — Other Ambulatory Visit: Payer: Medicare Other | Admitting: *Deleted

## 2019-04-23 ENCOUNTER — Other Ambulatory Visit: Payer: Self-pay | Admitting: Cardiology

## 2019-04-23 ENCOUNTER — Other Ambulatory Visit: Payer: Self-pay

## 2019-04-23 DIAGNOSIS — I5022 Chronic systolic (congestive) heart failure: Secondary | ICD-10-CM

## 2019-04-23 DIAGNOSIS — R0602 Shortness of breath: Secondary | ICD-10-CM

## 2019-04-23 DIAGNOSIS — E039 Hypothyroidism, unspecified: Secondary | ICD-10-CM

## 2019-04-23 DIAGNOSIS — Z950 Presence of cardiac pacemaker: Secondary | ICD-10-CM

## 2019-04-23 DIAGNOSIS — I428 Other cardiomyopathies: Secondary | ICD-10-CM

## 2019-04-23 DIAGNOSIS — Z95810 Presence of automatic (implantable) cardiac defibrillator: Secondary | ICD-10-CM

## 2019-04-23 DIAGNOSIS — R7989 Other specified abnormal findings of blood chemistry: Secondary | ICD-10-CM

## 2019-04-24 LAB — T3, FREE: T3, Free: 1.4 pg/mL — ABNORMAL LOW (ref 2.0–4.4)

## 2019-04-24 LAB — TSH: TSH: 34.8 u[IU]/mL — ABNORMAL HIGH (ref 0.450–4.500)

## 2019-04-24 LAB — T4, FREE: Free T4: 0.78 ng/dL — ABNORMAL LOW (ref 0.82–1.77)

## 2019-04-26 ENCOUNTER — Telehealth: Payer: Self-pay | Admitting: *Deleted

## 2019-04-26 DIAGNOSIS — R7989 Other specified abnormal findings of blood chemistry: Secondary | ICD-10-CM

## 2019-04-26 DIAGNOSIS — E039 Hypothyroidism, unspecified: Secondary | ICD-10-CM

## 2019-04-26 MED ORDER — LEVOTHYROXINE SODIUM 150 MCG PO TABS: 150.0000 ug | ORAL_TABLET | Freq: Every day | ORAL | 1 refills | Status: DC

## 2019-04-26 NOTE — Telephone Encounter (Signed)
Spoke with the pt and informed her that per Dr. Delton See, her labs showed that her TSH level is abnormal, and she recommends that we increase her synthroid to 150 mcg po daily, and repeat TSH, Free T3, and Free T4 in 6-8 weeks.  Confirmed the pharmacy of choice with the pt.  Scheduled the pt to come back in for repeat labs to recheck TSH, FREE T3/T4 in 6-8 weeks on 06/10/19.  Pt verbalized understanding and agrees with this plan.

## 2019-04-26 NOTE — Telephone Encounter (Signed)
-----   Message from Lars Masson, MD sent at 04/26/2019 10:53 AM EDT ----- Her thyroid function remains low, please increase Synthroid 150 micrograms per day, repeat TSH free T3 and 4 in 6 to 8 weeks.

## 2019-05-04 ENCOUNTER — Ambulatory Visit (INDEPENDENT_AMBULATORY_CARE_PROVIDER_SITE_OTHER): Payer: Medicare Other | Admitting: *Deleted

## 2019-05-04 DIAGNOSIS — I428 Other cardiomyopathies: Secondary | ICD-10-CM | POA: Diagnosis not present

## 2019-05-04 LAB — CUP PACEART REMOTE DEVICE CHECK
Battery Remaining Longevity: 94 mo
Battery Voltage: 2.98 V
Brady Statistic AP VP Percent: 16.54 %
Brady Statistic AP VS Percent: 0.13 %
Brady Statistic AS VP Percent: 80.94 %
Brady Statistic AS VS Percent: 2.4 %
Brady Statistic RA Percent Paced: 17.62 %
Brady Statistic RV Percent Paced: 22.99 %
Date Time Interrogation Session: 20210427010904
Implantable Lead Implant Date: 20190111
Implantable Lead Implant Date: 20190111
Implantable Lead Implant Date: 20190111
Implantable Lead Location: 753858
Implantable Lead Location: 753859
Implantable Lead Location: 753860
Implantable Lead Model: 5076
Implantable Lead Model: 5076
Implantable Pulse Generator Implant Date: 20190111
Lead Channel Impedance Value: 1539 Ohm
Lead Channel Impedance Value: 1539 Ohm
Lead Channel Impedance Value: 1558 Ohm
Lead Channel Impedance Value: 1634 Ohm
Lead Channel Impedance Value: 1634 Ohm
Lead Channel Impedance Value: 1691 Ohm
Lead Channel Impedance Value: 380 Ohm
Lead Channel Impedance Value: 418 Ohm
Lead Channel Impedance Value: 437 Ohm
Lead Channel Impedance Value: 513 Ohm
Lead Channel Impedance Value: 798 Ohm
Lead Channel Impedance Value: 874 Ohm
Lead Channel Impedance Value: 893 Ohm
Lead Channel Impedance Value: 893 Ohm
Lead Channel Pacing Threshold Amplitude: 0.5 V
Lead Channel Pacing Threshold Amplitude: 0.875 V
Lead Channel Pacing Threshold Amplitude: 2.125 V
Lead Channel Pacing Threshold Pulse Width: 0.4 ms
Lead Channel Pacing Threshold Pulse Width: 0.4 ms
Lead Channel Pacing Threshold Pulse Width: 1 ms
Lead Channel Sensing Intrinsic Amplitude: 1.125 mV
Lead Channel Sensing Intrinsic Amplitude: 1.125 mV
Lead Channel Sensing Intrinsic Amplitude: 14.375 mV
Lead Channel Sensing Intrinsic Amplitude: 14.375 mV
Lead Channel Setting Pacing Amplitude: 2 V
Lead Channel Setting Pacing Amplitude: 2.5 V
Lead Channel Setting Pacing Amplitude: 2.5 V
Lead Channel Setting Pacing Pulse Width: 0.4 ms
Lead Channel Setting Pacing Pulse Width: 1 ms
Lead Channel Setting Sensing Sensitivity: 0.9 mV

## 2019-05-05 NOTE — Progress Notes (Signed)
PPM Remote  

## 2019-05-20 ENCOUNTER — Other Ambulatory Visit: Payer: Self-pay

## 2019-05-20 ENCOUNTER — Encounter: Payer: Self-pay | Admitting: Cardiology

## 2019-05-20 ENCOUNTER — Ambulatory Visit (INDEPENDENT_AMBULATORY_CARE_PROVIDER_SITE_OTHER): Payer: Medicare Other | Admitting: Cardiology

## 2019-05-20 ENCOUNTER — Encounter (INDEPENDENT_AMBULATORY_CARE_PROVIDER_SITE_OTHER): Payer: Self-pay

## 2019-05-20 VITALS — BP 122/74 | HR 68 | Ht <= 58 in | Wt 169.0 lb

## 2019-05-20 DIAGNOSIS — R7989 Other specified abnormal findings of blood chemistry: Secondary | ICD-10-CM

## 2019-05-20 DIAGNOSIS — I5022 Chronic systolic (congestive) heart failure: Secondary | ICD-10-CM

## 2019-05-20 DIAGNOSIS — E032 Hypothyroidism due to medicaments and other exogenous substances: Secondary | ICD-10-CM

## 2019-05-20 DIAGNOSIS — Z95810 Presence of automatic (implantable) cardiac defibrillator: Secondary | ICD-10-CM | POA: Diagnosis not present

## 2019-05-20 DIAGNOSIS — Z79899 Other long term (current) drug therapy: Secondary | ICD-10-CM

## 2019-05-20 DIAGNOSIS — T827XXA Infection and inflammatory reaction due to other cardiac and vascular devices, implants and grafts, initial encounter: Secondary | ICD-10-CM

## 2019-05-20 DIAGNOSIS — I428 Other cardiomyopathies: Secondary | ICD-10-CM

## 2019-05-20 MED ORDER — FUROSEMIDE 40 MG PO TABS: 40.0000 mg | ORAL_TABLET | Freq: Every day | ORAL | 0 refills | Status: DC

## 2019-05-20 NOTE — Progress Notes (Signed)
Cardiology Office Note   Date:  05/20/2019   ID:  [name] [name], DOB Dec 09, 1935, MRN [id number]  Patient Location: Home Provider Location: Home  PCP:  Leonard Downing, MD  Cardiologist:  Ena Dawley, MD  Electrophysiologist: Dr Caryl Comes  Evaluation Performed:  Follow-Up Visit  Reason for visit: 3 month follow-up  History of Present Illness:    [name] [name] is a 84 y.o. female with h/o nonischemic cardiomyopathy, left bundle branch block, nonsustained ventricular tachycardia, HTN and pancreatitis.   She underwent CRT Biotronik implantation  06/2014.  06/2016, she was  hospitalized and found to have staph bacteremia of (MSSA) in the wake of orthopedic surgery.  TEE at [facility] was reportedly negative. She was treated with  6 weeks of antibiotics  She developed recurrent 'septic arthritis" and bacteremia and TEE confirmed Vegetation on RA lead. She was transferred to [facility] for extraction which she tolerated well.   Rx with 8 weeks of IV Abx, most recently 10/18 keflex and rifampin.  Because of worsening left ventricular function and worsening functional status, she underwent CRT reimplantation 01/2017, aware that she would need long-term chronic suppressive antibiotics. Per. Dr. Olin Pia recent [place] note, she has been considerably better following CRT implantation.  There is less shortness of breath and more energy.  Repeat echo has not yet been done.  03/23/2018 -this is 3 months follow-up, the patient has stable symptoms class IIb-III, she is now more independent as she needs to help her husband, still gets short of breath after short distances at home but is able to perform activities of daily living with some interruptions.  She has been compliant to medications.  Her primary care physician Dr Arelia Sneddon changed Synthroid that we prescribed to a different medication whose name she does not remember.  She denies any lower extremity edema orthopnea proximal nocturnal  dyspnea.  09/25/2018 - 2 months follow up, no labs since 03/23/18, she has been feeling well, walks 2-3 x/day without chest pain and no significant DOE. She has a blood work done suspicious for hemochromatosis (polycytemia, also FH - father diagnosed and daughter borderline), she was also diagnosed with CKD stage 4 and was taken off spironolactone. She feels well, no LE edema, orthopnea, PND.  02/18/2019 - the patient is coming after two weeks and in person after a long time.  She has been experiencing some more dyspnea on exertion, more fatigue and had to add a pillow at night because of two-pillow orthopnea.  She has no lower extremity edema no palpitations, on 2 occasion she had exertional chest pain that resolved quickly at rest no dizziness or syncope.  She has been compliant with her medication and she has been taking Lasix 40 mg daily, we have also recently increased dose of Synthroid since her TSH was 40 Bose free T3 and free T4 were decreased.  05/20/2019 -patient is coming after 3 months, she states that she is feels more short of breath, and has been waking up at night feeling short of breath.  Also she has noted that her device has been beeping at night.  She gets more tired with activities.  No significant lower extremity edema she continues to take Lasix 40 mg every other day.  No palpitation dizziness or syncope.   Prior CV studies:   The following studies were reviewed today:  08/14/16: TEE Study Conclusions - Left ventricle: Systolic function was normal. The estimated ejection fraction was in the range of 55% to 60%. Wall motion  was normal; there were no regional wall motion abnormalities. - Aortic valve: No evidence of vegetation. There was mild regurgitation. - Aorta: There was mild non-mobile atheroma. - Mitral valve: There was mild regurgitation. - Left atrium: No evidence of thrombus in the atrial cavity or appendage. No evidence of thrombus in the atrial cavity  or appendage. - Right atrium: No evidence of thrombus in the atrial cavity or appendage. No evidence of thrombus in the atrial cavity or appendage. - Tricuspid valve: No evidence of vegetation. There was mild regurgitation. - Pulmonic valve: No evidence of vegetation. Impressions: - There is a highly mobile echodensity measuring 13 x 3 mm attached to the right atrial lead in the right atrium at the tricuspid valve level. This is consistent with pacemaker endocarditis.  10/17/15: limited echo for AV optimization Impressions: - The study is performed for AV optimization There is grade 1 diastolic dysfunction The optimal AV delay seems to be around 90-100 ms but i would defer to the doctors managing the pacer.  02/01/15: TTE Study Conclusions - Left ventricle: The cavity size was normal. There was moderate concentric hypertrophy. Systolic function was normal. The estimated ejection fraction was in the range of 50% to 55%. Wall motion was normal; there were no regional wall motion abnormalities. Doppler parameters are consistent with abnormal left ventricular relaxation (grade 1 diastolic dysfunction). The E/e&' ratio is >15, suggesting elevated LV filling pressure. - Mitral valve: Calcified annulus. Mildly thickened leaflets . There was trivial regurgitation. - Left atrium: The atrium was normal in size. - Right ventricle: The cavity size was normal. Wall thickness was normal. Pacer wire or catheter noted in right ventricle. Systolic function was normal. - Right atrium: The atrium was normal in size. Pacer wire or catheter noted in right atrium. - Tricuspid valve: There was moderate regurgitation. - Pulmonary arteries: PA peak pressure: 30 mm Hg (S). - Inferior vena cava: The vessel was normal in size. The respirophasic diameter changes were in the normal range (= 50%), consistent with normal central venous pressure. Impressions: -  Compared to a prior study in 09/2014, the EF has improved to 50-55%.  The patient does not have symptoms concerning for COVID-19 infection (fever, chills, cough, or new shortness of breath).   Past Medical History:  Diagnosis Date  . 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 Acute lower GI bleeding 05/06/2017  . Acute lower GI bleeding 05/06/2017  . Anemia 05/06/2017  . Arthritis    "finger joints" (06/21/2016)  . Asthma   . CHF (congestive heart failure) ([place])   . Congestive dilated cardiomyopathy ([place]) 03/31/2014   a. s/p Biotronik CRTD 06/2014  . GERD (gastroesophageal reflux disease)   . Headache   . History of blood transfusion 1990s   "related to hysterectomy"  . Hypothyroidism   . Left bundle branch block   . MSSA bacteremia 06/21/2016  . Myocardial infarction ([place])   . Pancreatitis, acute    Past Surgical History:  Procedure Laterality Date  . ABDOMINAL HYSTERECTOMY    . APPENDECTOMY    . BIV PACEMAKER INSERTION CRT-P N/A 01/17/2017   Procedure: BIV PACEMAKER INSERTION CRT-P;  Surgeon: Deboraha Sprang, MD;  Location: [place] CV [facility];  Service: Cardiovascular;  Laterality: N/A;  . CARDIAC CATHETERIZATION    . CARDIAC DEFIBRILLATOR PLACEMENT  ?2016  . CATARACT EXTRACTION W/ INTRAOCULAR LENS  IMPLANT, BILATERAL Bilateral   .  CHOLECYSTECTOMY OPEN     "related to pancreatitis"  . DILATION AND CURETTAGE OF UTERUS    . EP IMPLANTABLE DEVICE N/A 07/04/2014   Procedure: BiV ICD Insertion CRT-D;  Surgeon: Deboraha Sprang, MD;  Location: [place] CV [facility];  Service: Cardiovascular;  Laterality: N/A;  . ESOPHAGOGASTRODUODENOSCOPY (EGD) WITH PROPOFOL N/A 05/08/2017   Procedure: ESOPHAGOGASTRODUODENOSCOPY (EGD) WITH PROPOFOL;  Surgeon: Wonda Horner, MD;  Location: [facility] ENDOSCOPY;  Service: Endoscopy;  Laterality: N/A;  . HERNIA REPAIR    . I & D KNEE WITH POLY EXCHANGE Right 08/11/2016   Procedure:  IRRIGATION AND DEBRIDEMENT KNEE WITH POLY EXCHANGE;  Surgeon: Rod Can, MD;  Location: [place];  Service: Orthopedics;  Laterality: Right;  . INSERT / REPLACE / REMOVE PACEMAKER  ?2016  . JOINT REPLACEMENT Bilateral    "thumb joints; used my own material"  . JOINT REPLACEMENT    . LEFT HEART CATHETERIZATION WITH CORONARY ANGIOGRAM N/A 11/02/2013   Procedure: LEFT HEART CATHETERIZATION WITH CORONARY ANGIOGRAM;  Surgeon: Jettie Booze, MD;  Location: [facility] CATH LAB;  Service: Cardiovascular;  Laterality: N/A;  . LOOP RECORDER IMPLANT N/A 04/13/2014   Procedure: LOOP RECORDER IMPLANT;  Surgeon: Deboraha Sprang, MD;  Location: [facility] CATH LAB;  Service: Cardiovascular;  Laterality: N/A;  . LOOP RECORDER REMOVAL  ?2016  . POSTERIOR LUMBAR FUSION  05/03/2016   Lumbar Two-Three, Lumbar Three-Four Posterior Lumbar Fusion withLaminectomy and Foraminotomy  . SPINE SURGERY  04/2016   lumbar   . TEE WITHOUT CARDIOVERSION N/A 08/14/2016   Procedure: TRANSESOPHAGEAL ECHOCARDIOGRAM (TEE);  Surgeon: Dorothy Spark, MD;  Location: [facility] ENDOSCOPY;  Service: Cardiovascular;  Laterality: N/A;  . TONSILLECTOMY    . TOTAL HIP ARTHROPLASTY Bilateral   . UMBILICAL HERNIA REPAIR  1970s?     Current Meds  Medication Sig  . albuterol (PROAIR HFA) 108 (90 Base) MCG/ACT inhaler Inhale 2 puffs into the lungs every 6 (six) hours as needed for wheezing or shortness of breath.  . Aspirin-Acetaminophen-Caffeine (EXCEDRIN PO) Take 1 tablet by mouth as needed.  . betamethasone dipropionate (DIPROLENE) 0.05 % ointment Apply 1 application topically daily as needed.  . Calcium Carbonate Antacid (TUMS PO) Take 1 tablet by mouth daily as needed (indigestion).   . carvedilol (COREG) 12.5 MG tablet TAKE 1 TABLET TWICE A DAY  . cephALEXin (KEFLEX) 500 MG capsule Take 2 capsules (1,000 mg total) by mouth 2 (two) times daily.  [name] ENTRESTO 24-26 MG TAKE 1 TABLET TWICE A DAY  . feeding supplement, ENSURE ENLIVE, (ENSURE ENLIVE) LIQD  Take 237 mLs by mouth 2 (two) times daily between meals.  [name] levothyroxine (SYNTHROID) 150 MCG tablet Take 1 tablet (150 mcg total) by mouth daily before breakfast.  . Melatonin 3 MG TABS Take 3 mg by mouth at bedtime as needed (for sleep.).   [name] methocarbamol (ROBAXIN) 500 MG tablet Take 1 tablet (500 mg total) by mouth every 6 (six) hours as needed for muscle spasms.  . methylphenidate (RITALIN) 20 MG tablet Take 40 mg by mouth daily.   Mable Fill (ALLERGY EYE OP) Place 1-2 drops into both eyes daily as needed (for irritated eyes).  . naproxen (NAPROSYN) 500 MG tablet Take 500 mg by mouth 2 (two) times daily as needed.  . ondansetron (ZOFRAN) 8 MG tablet Take 8 mg by mouth as needed.  . pantoprazole (PROTONIX) 40 MG tablet Take 40 mg by mouth daily.  . polyethylene glycol (MIRALAX / GLYCOLAX) packet Take 17 g by mouth daily  as needed for mild constipation.  . rifampin (RIFADIN) 300 MG capsule TAKE 1 CAPSULE(300 MG) BY MOUTH EVERY 12 HOURS  . sucralfate (CARAFATE) 1 g tablet Take 1 g by mouth daily.  Grant Ruts INHUB 250-50 MCG/DOSE AEPB USE 1 INHALATION BY MOUTH TWICE DAILY  . [DISCONTINUED] furosemide (LASIX) 40 MG tablet Take one tablet every other day     Allergies:   Sulfa antibiotics   Social History   Tobacco Use  . Smoking status: Never Smoker  . Smokeless tobacco: Never Used  . Tobacco comment: "smoked as a teenager; for maybe 1 wk"  Substance Use Topics  . Alcohol use: Yes    Comment: 6/15//2018 "glass of wine q 6 months or so"  . Drug use: No     Family Hx: The patient's family history includes Arrhythmia in her mother; Heart attack in her father, maternal grandfather, and paternal grandfather; Heart disease in her father; Hyperlipidemia in her mother.  ROS:   Please see the history of present illness.    All other systems reviewed and are negative.  Labs/Other Tests and Data Reviewed:    EKG:  No ECG reviewed.  Recent Labs: 02/05/2019: ALT 11;  Hemoglobin 12.8; Platelets 169 03/19/2019: BUN 26; Creatinine, Ser 1.06; NT-Pro BNP 136; Potassium 4.6; Sodium 143 04/23/2019: TSH 34.800   Recent Lipid Panel Lab Results  Component Value Date/Time   CHOL 171 05/06/2017 03:51 PM   TRIG 127 05/06/2017 03:51 PM   HDL 43 05/06/2017 03:51 PM   CHOLHDL 4.0 05/06/2017 03:51 PM   LDLCALC 103 (H) 05/06/2017 03:51 PM   Wt Readings from Last 3 Encounters:  05/20/19 169 lb (76.7 kg)  03/08/19 167 lb (75.8 kg)  02/18/19 165 lb 12.8 oz (75.2 kg)    Objective:    Vital Signs:  BP 122/74   Pulse 68   Ht '4\' 10"'$  (1.473 m)   Wt 169 lb (76.7 kg)   LMP  (LMP Unknown)   SpO2 98%   BMI 35.32 kg/m    VITAL SIGNS:  reviewed    ASSESSMENT & PLAN:    Acute on chronic chronic systolic HF:  -OptiVol showed increased volume in early to mid January, with increased Lasix 40 mg daily, it came back down to normal, creatinine slightly up from 0.99-1.26, we will decrease Lasix to 20 mg daily. -Her weight is now up 4 pounds since then, she is having symptoms of worsening CHF, I will increase Lasix to 40 mg p.o. daily.  I checked with the device clinic and her OptiVol levels are still below threshold but elevated. -We will obtain BMP and BNP today. -Her echocardiogram performed in February showed LVEF 30-35%, unchanged from prior.  Hypothyroidism -Her TSH has been persistently elevated.  We have been increasing the dose of Synthroid.  With no significant improvement in TSH levels also free T4 and free T3 are low.  I will check again today at the same time we will refer her to endocrinology and obtain thyroid ultrasound.  She mentioned that her daughter was born with only 1 thyroid gland.  Nonischemic cardiomyopathy - LVEF 30-35 % on the most recent echocardiogram from January 2020, initially improvement to 50 to 55% with a BiV ICD, however her device get infected had to be removed she received another device in January 2019, post explantation. -She is on Coreg  12.5 mg p.o. twice daily, Entresto 24/26 mg, she could not tolerate spironolactone.  BiV/ICD: history outline above in HPI. Required extraction due  to bacteremia/lead vegetation. Underwent re-implantation 01/2017.  Followed by Dr. Caryl Comes. Functionally she seemed to be doing quite well following CRT implantation.  QRS is considerably narrowed.    COVID-19 Education: The signs and symptoms of COVID-19 were discussed with the patient and how to seek care for testing (follow up with PCP or arrange E-visit).  The importance of social distancing was discussed today.  Time:   Today, I have spent 25 minutes with the patient with telehealth technology discussing the above problems.    Medication Adjustments/Labs and Tests Ordered: Current medicines are reviewed at length with the patient today.  Concerns regarding medicines are outlined above.   Tests Ordered: Orders Placed This Encounter  Procedures  . US THYROID  . Pro b natriuretic peptide  . Comp Met (CMET)  . TSH  . T3, free  . T4, free  . Ambulatory referral to Endocrinology  . EKG 12-Lead    Medication Changes: Meds ordered this encounter  Medications  . furosemide (LASIX) 40 MG tablet    Sig: Take 1 tablet (40 mg total) by mouth daily.    Dispense:  90 tablet    Refill:  0    DOSE INCREASE    Disposition:  Follow up in 4 month(s)  Signed, Ena Dawley, MD  05/20/2019 11:03 AM    [place]

## 2019-05-20 NOTE — Patient Instructions (Signed)
Medication Instructions:   INCREASE YOUR LASIX TO 40 MG BY MOUTH DAILY  *If you need a refill on your cardiac medications before your next appointment, please call your pharmacy*   Lab Work:  TODAY--CMET, PRO-BNP, TSH, FREE T3, AND FREE T4  If you have labs (blood work) drawn today and your tests are completely normal, you will receive your results only by: [name] MyChart Message (if you have MyChart) OR . A paper copy in the mail If you have any lab test that is abnormal or we need to change your treatment, we will call you to review the results.   You have been referred to ENDOCRINOLOGY FOR FURTHER MANAGEMENT OF YOUR THYROID   Testing/Procedures:  THYROID ULTRASOUND TO BE DONE TO FURTHER INVESTIGATE ABNORMAL THYROID LEVELS   Follow-Up:  2 MONTHS IN THE OFFICE TO SEE DR. Delton See

## 2019-05-21 LAB — COMPREHENSIVE METABOLIC PANEL
ALT: 11 IU/L (ref 0–32)
AST: 20 IU/L (ref 0–40)
Albumin/Globulin Ratio: 2 (ref 1.2–2.2)
Albumin: 4.6 g/dL (ref 3.6–4.6)
Alkaline Phosphatase: 56 IU/L (ref 39–117)
BUN/Creatinine Ratio: 22 (ref 12–28)
BUN: 28 mg/dL — ABNORMAL HIGH (ref 8–27)
Bilirubin Total: 0.5 mg/dL (ref 0.0–1.2)
CO2: 24 mmol/L (ref 20–29)
Calcium: 9.8 mg/dL (ref 8.7–10.3)
Chloride: 100 mmol/L (ref 96–106)
Creatinine, Ser: 1.26 mg/dL — ABNORMAL HIGH (ref 0.57–1.00)
GFR calc Af Amer: 46 mL/min/{1.73_m2} — ABNORMAL LOW (ref >59)
GFR calc non Af Amer: 40 mL/min/{1.73_m2} — ABNORMAL LOW (ref >59)
Globulin, Total: 2.3 g/dL (ref 1.5–4.5)
Glucose: 104 mg/dL — ABNORMAL HIGH (ref 65–99)
Potassium: 4.7 mmol/L (ref 3.5–5.2)
Sodium: 139 mmol/L (ref 134–144)
Total Protein: 6.9 g/dL (ref 6.0–8.5)

## 2019-05-21 LAB — T3, FREE: T3, Free: 1.7 pg/mL — ABNORMAL LOW (ref 2.0–4.4)

## 2019-05-21 LAB — T4, FREE: Free T4: 1.08 ng/dL (ref 0.82–1.77)

## 2019-05-21 LAB — PRO B NATRIURETIC PEPTIDE: NT-Pro BNP: 110 pg/mL (ref 0–738)

## 2019-05-21 LAB — TSH: TSH: 31 u[IU]/mL — ABNORMAL HIGH (ref 0.450–4.500)

## 2019-05-28 ENCOUNTER — Other Ambulatory Visit: Payer: Self-pay

## 2019-05-28 ENCOUNTER — Ambulatory Visit (HOSPITAL_COMMUNITY)
Admission: RE | Admit: 2019-05-28 | Discharge: 2019-05-28 | Disposition: A | Discharge status: Home / Self Care | Payer: Medicare Other | Source: Ambulatory Visit | Attending: Cardiology | Admitting: Cardiology

## 2019-05-28 DIAGNOSIS — R7989 Other specified abnormal findings of blood chemistry: Secondary | ICD-10-CM | POA: Insufficient documentation

## 2019-05-30 ENCOUNTER — Other Ambulatory Visit: Payer: Self-pay | Admitting: Cardiology

## 2019-06-10 ENCOUNTER — Other Ambulatory Visit: Payer: Medicare Other

## 2019-06-30 ENCOUNTER — Ambulatory Visit: Payer: Medicare Other | Admitting: Cardiology

## 2019-07-05 ENCOUNTER — Telehealth: Payer: Self-pay | Admitting: Internal Medicine

## 2019-07-05 ENCOUNTER — Other Ambulatory Visit: Payer: Self-pay

## 2019-07-05 ENCOUNTER — Ambulatory Visit (INDEPENDENT_AMBULATORY_CARE_PROVIDER_SITE_OTHER): Payer: Medicare Other | Admitting: Internal Medicine

## 2019-07-05 ENCOUNTER — Encounter: Payer: Self-pay | Admitting: Internal Medicine

## 2019-07-05 VITALS — BP 138/62 | HR 68 | Ht <= 58 in | Wt 169.6 lb

## 2019-07-05 DIAGNOSIS — E039 Hypothyroidism, unspecified: Secondary | ICD-10-CM | POA: Diagnosis not present

## 2019-07-05 LAB — TSH: TSH: 30.79 u[IU]/mL — ABNORMAL HIGH (ref 0.35–4.50)

## 2019-07-05 LAB — T4, FREE: Free T4: 0.6 ng/dL (ref 0.60–1.60)

## 2019-07-05 MED ORDER — LEVOTHYROXINE SODIUM 175 MCG PO TABS: 175.0000 ug | ORAL_TABLET | Freq: Every day | ORAL | 1 refills | Status: AC

## 2019-07-05 NOTE — Telephone Encounter (Signed)
Attempted to call pt and phone just continuously, no vm picked up. Will try again at later date.

## 2019-07-05 NOTE — Patient Instructions (Signed)

## 2019-07-05 NOTE — Telephone Encounter (Signed)
Please let her know that her thyroid is still off, and I am going to increase levothyroxine from 150 to 175 mcg daily  . New prescription will be sent\  Thanks  Abby Raelyn Mora, MD  [facility] Endocrinology  [facility] [address] [place], [place] [address] Phone: [phone] FAX: [phone]

## 2019-07-05 NOTE — Progress Notes (Signed)
Name: [name] [name]  MRN/ DOB: [id number], [birth year]    Age/ Sex: 84 y.o., female    PCP: [name], MD   Reason for Endocrinology Evaluation: Hypothyroidism     Date of Initial Endocrinology Evaluation: 07/05/2019     HPI: [name] [name] is a 84 y.o. female with a past medical history of nonischemic cardiomyopathy, LBBB, NSVT , HTN and Hx of pancreatitis. The patient presented for initial endocrinology clinic visit on 07/05/2019 for consultative assistance with her Hypothyroidism.   Pt has been diaganosed with hypothyroidism ~ 20 yrs . Has been on Levothyroxine since her diagnosis  She takes it with breakfast.  She is on sucralfate daily as well as Protonix ,and uses Tums as needed.   Weight has been stable, having difficulty with weight loss.   Denies constipation  Energy level fluctuates.    No prior exposure to radiation, no prior intestinal surgeries Strong FH of thyroid disease   HISTORY:  Past Medical History:  Past Medical History:  Diagnosis Date  . 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 Acute lower GI bleeding 05/06/2017  . Acute lower GI bleeding 05/06/2017  . Anemia 05/06/2017  . Arthritis    "finger joints" (06/21/2016)  . Asthma   . CHF (congestive heart failure) (HCC)   . Congestive dilated cardiomyopathy (HCC) 03/31/2014   a. s/p Biotronik CRTD 06/2014  . GERD (gastroesophageal reflux disease)   . Headache   . History of blood transfusion 1990s   "related to hysterectomy"  . Hypothyroidism   . Left bundle branch block   . MSSA bacteremia 06/21/2016  . Myocardial infarction (HCC)   . Pancreatitis, acute     Past Surgical History:  Past Surgical History:  Procedure Laterality Date  . ABDOMINAL HYSTERECTOMY    . APPENDECTOMY    . BIV PACEMAKER INSERTION CRT-P N/A 01/17/2017   Procedure: BIV PACEMAKER INSERTION CRT-P;  Surgeon: Duke Salvia, MD;  Location: [facility] INVASIVE CV LAB;  Service: Cardiovascular;  Laterality: N/A;  . CARDIAC CATHETERIZATION    . CARDIAC DEFIBRILLATOR PLACEMENT  ?2016  . CATARACT EXTRACTION W/ INTRAOCULAR LENS  IMPLANT, BILATERAL Bilateral   . CHOLECYSTECTOMY OPEN     "related to pancreatitis"  . DILATION AND CURETTAGE OF UTERUS    . EP IMPLANTABLE DEVICE N/A 07/04/2014   Procedure: BiV ICD Insertion CRT-D;  Surgeon: Duke Salvia, MD;  Location: [facility] INVASIVE CV LAB;  Service: Cardiovascular;  Laterality: N/A;  . ESOPHAGOGASTRODUODENOSCOPY (EGD) WITH PROPOFOL N/A 05/08/2017   Procedure: ESOPHAGOGASTRODUODENOSCOPY (EGD) WITH PROPOFOL;  Surgeon: Graylin Shiver, MD;  Location: [facility] ENDOSCOPY;  Service: Endoscopy;  Laterality: N/A;  . HERNIA REPAIR    . I & D KNEE WITH POLY EXCHANGE Right 08/11/2016   Procedure: IRRIGATION AND DEBRIDEMENT KNEE WITH POLY EXCHANGE;  Surgeon: Samson Frederic, MD;  Location: [facility] OR;  Service: Orthopedics;  Laterality: Right;  . INSERT / REPLACE / REMOVE PACEMAKER  ?2016  . JOINT REPLACEMENT Bilateral    "thumb joints; used my own material"  . JOINT REPLACEMENT    . LEFT HEART CATHETERIZATION WITH CORONARY ANGIOGRAM N/A 11/02/2013   Procedure: LEFT HEART CATHETERIZATION WITH CORONARY ANGIOGRAM;  Surgeon: Corky Crafts, MD;  Location: [facility] CATH LAB;  Service: Cardiovascular;  Laterality: N/A;  . LOOP RECORDER IMPLANT N/A 04/13/2014   Procedure: LOOP RECORDER IMPLANT;  Surgeon: Salvatore Decent  Graciela Husbands, MD;  Location: [facility] CATH LAB;  Service: Cardiovascular;  Laterality: N/A;  . LOOP RECORDER REMOVAL  ?2016  . POSTERIOR LUMBAR FUSION  05/03/2016   Lumbar Two-Three, Lumbar Three-Four Posterior Lumbar Fusion withLaminectomy and Foraminotomy  . SPINE SURGERY  04/2016   lumbar   . TEE WITHOUT CARDIOVERSION N/A 08/14/2016   Procedure: TRANSESOPHAGEAL ECHOCARDIOGRAM (TEE);  Surgeon: Lars Masson, MD;  Location: [facility] ENDOSCOPY;  Service: Cardiovascular;  Laterality: N/A;  . TONSILLECTOMY    . TOTAL  HIP ARTHROPLASTY Bilateral   . UMBILICAL HERNIA REPAIR  1970s?      Social History:  reports that she has never smoked. She has never used smokeless tobacco. She reports current alcohol use. She reports that she does not use drugs.  Family History: family history includes Arrhythmia in her mother; Heart attack in her father, maternal grandfather, and paternal grandfather; Heart disease in her father; Hyperlipidemia in her mother.   HOME MEDICATIONS: Allergies as of 07/05/2019      Reactions   Sulfa Antibiotics Hives      Medication List       Accurate as of July 05, 2019  7:37 AM. If you have any questions, ask your nurse or doctor.        ALLERGY EYE OP Place 1-2 drops into both eyes daily as needed (for irritated eyes).   betamethasone dipropionate 0.05 % ointment Commonly known as: DIPROLENE Apply 1 application topically daily as needed.   carvedilol 12.5 MG tablet Commonly known as: COREG Take 1 tablet (12.5 mg total) by mouth 2 (two) times daily.   cephALEXin 500 MG capsule Commonly known as: Keflex Take 2 capsules (1,000 mg total) by mouth 2 (two) times daily.   Entresto 24-26 MG Generic drug: sacubitril-valsartan TAKE 1 TABLET TWICE A DAY   EXCEDRIN PO Take 1 tablet by mouth as needed.   feeding supplement (ENSURE ENLIVE) Liqd Take 237 mLs by mouth 2 (two) times daily between meals.   furosemide 40 MG tablet Commonly known as: LASIX Take 1 tablet (40 mg total) by mouth daily.   levothyroxine 150 MCG tablet Commonly known as: Synthroid Take 1 tablet (150 mcg total) by mouth daily before breakfast.   melatonin 3 MG Tabs tablet Take 3 mg by mouth at bedtime as needed (for sleep.).   methocarbamol 500 MG tablet Commonly known as: ROBAXIN Take 1 tablet (500 mg total) by mouth every 6 (six) hours as needed for muscle spasms.   methylphenidate 20 MG tablet Commonly known as: RITALIN Take 40 mg by mouth daily.   naproxen 500 MG tablet Commonly known  as: NAPROSYN Take 500 mg by mouth 2 (two) times daily as needed.   nitroGLYCERIN 0.4 MG SL tablet Commonly known as: NITROSTAT Place 1 tablet (0.4 mg total) every 5 (five) minutes as needed under the tongue for chest pain.   ondansetron 8 MG tablet Commonly known as: ZOFRAN Take 8 mg by mouth as needed.   pantoprazole 40 MG tablet Commonly known as: PROTONIX Take 40 mg by mouth daily.   polyethylene glycol 17 g packet Commonly known as: MIRALAX / GLYCOLAX Take 17 g by mouth daily as needed for mild constipation.   ProAir HFA 108 (90 Base) MCG/ACT inhaler Generic drug: albuterol Inhale 2 puffs into the lungs every 6 (six) hours as needed for wheezing or shortness of breath.   rifampin 300 MG capsule Commonly known as: RIFADIN TAKE 1 CAPSULE(300 MG) BY MOUTH EVERY 12 HOURS   sucralfate 1  g tablet Commonly known as: CARAFATE Take 1 g by mouth daily.   TUMS PO Take 1 tablet by mouth daily as needed (indigestion).   Wixela Inhub 250-50 MCG/DOSE Aepb Generic drug: Fluticasone-Salmeterol USE 1 INHALATION BY MOUTH TWICE DAILY         REVIEW OF SYSTEMS: A comprehensive ROS was conducted with the patient and is negative except as per HPI and below:  ROS     OBJECTIVE:  VS: LMP  (LMP Unknown)    Wt Readings from Last 3 Encounters:  05/20/19 169 lb (76.7 kg)  03/08/19 167 lb (75.8 kg)  02/18/19 165 lb 12.8 oz (75.2 kg)     EXAM: General: Pt appears well and is in NAD  Eyes: External eye exam normal without stare, lid lag or exophthalmos.  EOM intact.   Neck: General: Supple without adenopathy. Thyroid:  No goiter or nodules appreciated.   Lungs: Clear with good BS bilat with no rales, rhonchi, or wheezes  Heart: Auscultation: RRR.  Extremities:  BL LE: No pretibial edema normal ROM and strength.     Neuro: Cranial nerves: II - XII grossly intact  DTRs: 2+ and symmetric in UE without delay in relaxation phase  Mental Status: Judgment, insight:  Intact Orientation: Oriented to time, place, and person Mood and affect: No depression, anxiety, or agitation     DATA REVIEWED:   Results for RICARDA, ATAYDE (MRN [id number]) as of 07/05/2019 12:51  Ref. Range 07/05/2019 09:23  TSH Latest Ref Range: 0.35 - 4.50 uIU/mL 30.79 (H)  T4,Free(Direct) Latest Ref Range: 0.60 - 1.60 ng/dL 0.60    ASSESSMENT/PLAN/RECOMMENDATIONS:   1. Hypothyroidism:  - TSH remains elevated due to LT- 4 malabsoprtion , pt has been taking it with food, sucralfate and PPI  - Pt educated extensively on the correct way to take levothyroxine (first thing in the morning with water, 30 minutes before eating or taking other medications). - Pt encouraged to double dose the following day if she were to miss a dose given long half-life of levothyroxine.   Medications : Increase levothyroxine to 175 mcg daily   F/U in 3 months    Signed electronically by: Mack Guise, MD  [facility]  [facility] [address] [place], [place] [address] Phone: [phone] FAX: [phone]   CC: Leonard Downing, MD [address] [place] [address] Phone: [phone] Fax: [phone]   Return to Endocrinology clinic as below: Future Appointments  Date Time Provider [place]  07/05/2019  8:50 AM Janeen Watson, Melanie Crazier, MD [facility] None  07/13/2019 11:30 AM Tommy Medal, Lavell Islam, MD [facility]  07/22/2019  9:20 AM Dorothy Spark, MD [facility]  08/03/2019  7:45 AM [facility] DEVICE REMOTES [facility]  11/02/2019  7:45 AM [facility] DEVICE REMOTES [facility]

## 2019-07-06 NOTE — Telephone Encounter (Signed)
2nd attempt phone just rang no vm

## 2019-07-07 ENCOUNTER — Encounter: Payer: Self-pay | Admitting: Internal Medicine

## 2019-07-07 NOTE — Telephone Encounter (Signed)
3 attempts no answer.

## 2019-07-13 ENCOUNTER — Ambulatory Visit: Payer: Medicare Other | Admitting: Infectious Disease

## 2019-07-19 ENCOUNTER — Ambulatory Visit: Payer: Medicare Other | Admitting: Infectious Disease

## 2019-07-22 ENCOUNTER — Ambulatory Visit: Payer: Medicare Other | Admitting: Cardiology

## 2019-07-26 ENCOUNTER — Other Ambulatory Visit: Payer: Self-pay | Admitting: Infectious Disease

## 2019-07-26 ENCOUNTER — Telehealth: Payer: Self-pay

## 2019-07-26 DIAGNOSIS — T8453XD Infection and inflammatory reaction due to internal right knee prosthesis, subsequent encounter: Secondary | ICD-10-CM

## 2019-07-26 DIAGNOSIS — T827XXA Infection and inflammatory reaction due to other cardiac and vascular devices, implants and grafts, initial encounter: Secondary | ICD-10-CM

## 2019-07-26 DIAGNOSIS — T8450XS Infection and inflammatory reaction due to unspecified internal joint prosthesis, sequela: Secondary | ICD-10-CM

## 2019-07-26 NOTE — Telephone Encounter (Signed)
Received medication refill request for rifampin and cephalexin for ICD infection. Patient was last seen in 07/2018. Patient recently has 1 year follow up visit with Dr. Daiva Eves but patient did not come to appointment. Routing to provider for refill advise. Valarie Cones

## 2019-07-26 NOTE — Telephone Encounter (Signed)
She needs to continue both I can't have her pacemaker infection relapse

## 2019-07-31 ENCOUNTER — Other Ambulatory Visit: Payer: Self-pay | Admitting: Cardiology

## 2019-07-31 DIAGNOSIS — I5022 Chronic systolic (congestive) heart failure: Secondary | ICD-10-CM

## 2019-07-31 DIAGNOSIS — I428 Other cardiomyopathies: Secondary | ICD-10-CM

## 2019-07-31 DIAGNOSIS — R7989 Other specified abnormal findings of blood chemistry: Secondary | ICD-10-CM

## 2019-07-31 DIAGNOSIS — E032 Hypothyroidism due to medicaments and other exogenous substances: Secondary | ICD-10-CM

## 2019-07-31 DIAGNOSIS — Z79899 Other long term (current) drug therapy: Secondary | ICD-10-CM

## 2019-07-31 DIAGNOSIS — Z95810 Presence of automatic (implantable) cardiac defibrillator: Secondary | ICD-10-CM

## 2019-07-31 DIAGNOSIS — T827XXA Infection and inflammatory reaction due to other cardiac and vascular devices, implants and grafts, initial encounter: Secondary | ICD-10-CM

## 2019-08-02 NOTE — Telephone Encounter (Signed)
Refills sent on 7/19. Thanks

## 2019-08-03 ENCOUNTER — Ambulatory Visit (INDEPENDENT_AMBULATORY_CARE_PROVIDER_SITE_OTHER): Payer: Medicare Other | Admitting: *Deleted

## 2019-08-03 DIAGNOSIS — I428 Other cardiomyopathies: Secondary | ICD-10-CM | POA: Diagnosis not present

## 2019-08-03 NOTE — Telephone Encounter (Signed)
Thank you :)

## 2019-08-04 LAB — CUP PACEART REMOTE DEVICE CHECK
Battery Remaining Longevity: 89 mo
Battery Voltage: 2.98 V
Brady Statistic AP VP Percent: 6.35 %
Brady Statistic AP VS Percent: 0 %
Brady Statistic AS VP Percent: 91.38 %
Brady Statistic AS VS Percent: 2.28 %
Brady Statistic RA Percent Paced: 7.18 %
Brady Statistic RV Percent Paced: 0 %
Date Time Interrogation Session: 20210728172714
Implantable Lead Implant Date: 20190111
Implantable Lead Implant Date: 20190111
Implantable Lead Implant Date: 20190111
Implantable Lead Location: 753858
Implantable Lead Location: 753859
Implantable Lead Location: 753860
Implantable Lead Model: 5076
Implantable Lead Model: 5076
Implantable Pulse Generator Implant Date: 20190111
Lead Channel Impedance Value: 1444 Ohm
Lead Channel Impedance Value: 1463 Ohm
Lead Channel Impedance Value: 1482 Ohm
Lead Channel Impedance Value: 1558 Ohm
Lead Channel Impedance Value: 1558 Ohm
Lead Channel Impedance Value: 1615 Ohm
Lead Channel Impedance Value: 342 Ohm
Lead Channel Impedance Value: 380 Ohm
Lead Channel Impedance Value: 399 Ohm
Lead Channel Impedance Value: 475 Ohm
Lead Channel Impedance Value: 722 Ohm
Lead Channel Impedance Value: 817 Ohm
Lead Channel Impedance Value: 836 Ohm
Lead Channel Impedance Value: 874 Ohm
Lead Channel Pacing Threshold Amplitude: 0.5 V
Lead Channel Pacing Threshold Amplitude: 0.75 V
Lead Channel Pacing Threshold Amplitude: 2.125 V
Lead Channel Pacing Threshold Pulse Width: 0.4 ms
Lead Channel Pacing Threshold Pulse Width: 0.4 ms
Lead Channel Pacing Threshold Pulse Width: 1 ms
Lead Channel Sensing Intrinsic Amplitude: 1.25 mV
Lead Channel Sensing Intrinsic Amplitude: 1.25 mV
Lead Channel Sensing Intrinsic Amplitude: 10.625 mV
Lead Channel Sensing Intrinsic Amplitude: 10.625 mV
Lead Channel Setting Pacing Amplitude: 2 V
Lead Channel Setting Pacing Amplitude: 2.5 V
Lead Channel Setting Pacing Amplitude: 2.5 V
Lead Channel Setting Pacing Pulse Width: 0.4 ms
Lead Channel Setting Pacing Pulse Width: 1 ms
Lead Channel Setting Sensing Sensitivity: 0.9 mV

## 2019-08-05 LAB — CUP PACEART REMOTE DEVICE CHECK
Battery Remaining Longevity: 89 mo
Battery Voltage: 2.98 V
Brady Statistic AP VP Percent: 13.09 %
Brady Statistic AP VS Percent: 0.06 %
Brady Statistic AS VP Percent: 83.94 %
Brady Statistic AS VS Percent: 2.91 %
Brady Statistic RA Percent Paced: 13.98 %
Brady Statistic RV Percent Paced: 22.72 %
Date Time Interrogation Session: 20210728172348
Implantable Lead Implant Date: 20190111
Implantable Lead Implant Date: 20190111
Implantable Lead Implant Date: 20190111
Implantable Lead Location: 753858
Implantable Lead Location: 753859
Implantable Lead Location: 753860
Implantable Lead Model: 5076
Implantable Lead Model: 5076
Implantable Pulse Generator Implant Date: 20190111
Lead Channel Impedance Value: 1444 Ohm
Lead Channel Impedance Value: 1463 Ohm
Lead Channel Impedance Value: 1482 Ohm
Lead Channel Impedance Value: 1558 Ohm
Lead Channel Impedance Value: 1558 Ohm
Lead Channel Impedance Value: 1615 Ohm
Lead Channel Impedance Value: 342 Ohm
Lead Channel Impedance Value: 380 Ohm
Lead Channel Impedance Value: 399 Ohm
Lead Channel Impedance Value: 475 Ohm
Lead Channel Impedance Value: 722 Ohm
Lead Channel Impedance Value: 817 Ohm
Lead Channel Impedance Value: 836 Ohm
Lead Channel Impedance Value: 874 Ohm
Lead Channel Pacing Threshold Amplitude: 0.5 V
Lead Channel Pacing Threshold Amplitude: 0.75 V
Lead Channel Pacing Threshold Amplitude: 2.125 V
Lead Channel Pacing Threshold Pulse Width: 0.4 ms
Lead Channel Pacing Threshold Pulse Width: 0.4 ms
Lead Channel Pacing Threshold Pulse Width: 1 ms
Lead Channel Sensing Intrinsic Amplitude: 1.25 mV
Lead Channel Sensing Intrinsic Amplitude: 1.25 mV
Lead Channel Sensing Intrinsic Amplitude: 10.625 mV
Lead Channel Sensing Intrinsic Amplitude: 10.625 mV
Lead Channel Setting Pacing Amplitude: 2 V
Lead Channel Setting Pacing Amplitude: 2.5 V
Lead Channel Setting Pacing Amplitude: 2.5 V
Lead Channel Setting Pacing Pulse Width: 0.4 ms
Lead Channel Setting Pacing Pulse Width: 1 ms
Lead Channel Setting Sensing Sensitivity: 0.9 mV

## 2019-08-06 NOTE — Progress Notes (Signed)
Remote pacemaker transmission.   

## 2019-09-01 ENCOUNTER — Other Ambulatory Visit: Payer: Self-pay | Admitting: Infectious Disease

## 2019-09-01 DIAGNOSIS — T8453XD Infection and inflammatory reaction due to internal right knee prosthesis, subsequent encounter: Secondary | ICD-10-CM

## 2019-09-01 DIAGNOSIS — T8450XS Infection and inflammatory reaction due to unspecified internal joint prosthesis, sequela: Secondary | ICD-10-CM

## 2019-09-01 DIAGNOSIS — T827XXA Infection and inflammatory reaction due to other cardiac and vascular devices, implants and grafts, initial encounter: Secondary | ICD-10-CM

## 2019-09-09 ENCOUNTER — Other Ambulatory Visit: Payer: Self-pay | Admitting: Cardiology

## 2019-09-09 DIAGNOSIS — I5022 Chronic systolic (congestive) heart failure: Secondary | ICD-10-CM

## 2019-09-09 DIAGNOSIS — Z95810 Presence of automatic (implantable) cardiac defibrillator: Secondary | ICD-10-CM

## 2019-09-22 ENCOUNTER — Telehealth (INDEPENDENT_AMBULATORY_CARE_PROVIDER_SITE_OTHER): Payer: Medicare Other | Admitting: Infectious Disease

## 2019-09-22 ENCOUNTER — Other Ambulatory Visit: Payer: Self-pay

## 2019-09-22 DIAGNOSIS — T8450XS Infection and inflammatory reaction due to unspecified internal joint prosthesis, sequela: Secondary | ICD-10-CM

## 2019-09-22 DIAGNOSIS — Z96651 Presence of right artificial knee joint: Secondary | ICD-10-CM

## 2019-09-22 DIAGNOSIS — Z95810 Presence of automatic (implantable) cardiac defibrillator: Secondary | ICD-10-CM

## 2019-09-22 DIAGNOSIS — T8484XD Pain due to internal orthopedic prosthetic devices, implants and grafts, subsequent encounter: Secondary | ICD-10-CM

## 2019-09-22 DIAGNOSIS — T827XXA Infection and inflammatory reaction due to other cardiac and vascular devices, implants and grafts, initial encounter: Secondary | ICD-10-CM | POA: Diagnosis not present

## 2019-09-22 DIAGNOSIS — T8453XD Infection and inflammatory reaction due to internal right knee prosthesis, subsequent encounter: Secondary | ICD-10-CM | POA: Diagnosis not present

## 2019-09-22 MED ORDER — CEPHALEXIN 500 MG PO CAPS: 1000.0000 mg | ORAL_CAPSULE | Freq: Two times a day (BID) | ORAL | 11 refills | Status: DC

## 2019-09-22 MED ORDER — RIFAMPIN 300 MG PO CAPS: ORAL_CAPSULE | ORAL | 11 refills | Status: DC

## 2019-09-22 NOTE — Progress Notes (Signed)
Virtual Visit via Telephone Note  I connected with [name] [name] on 09/22/19 at 10:30 AM EDT by telephone and verified that I am speaking with the correct person using two identifiers.  Location: Patient: Home Provider: [facility]   I discussed the limitations, risks, security and privacy concerns of performing an evaluation and management service by telephone and the availability of in person appointments. I also discussed with the patient that there may be a patient responsible charge related to this service. The patient expressed understanding and agreed to proceed.   History of Present Illness:  83  05/03/16. Presented to [facility] 6/7 with fevers, weakness and diffuse pain. She developed sepsis/hypotension and was found to have MSSA bacteremia and transferred to [facility] 6/15 for continued care and assessment by EP team considering she has cardiac device. At [facility] she underwent evaluation for metastatic sites of infection including CT of spine, TTE/TEE and BCx monitoring. All repeat BCx were negative once transferred here. TTE/TEE were both negative for lead or valvular endocarditis at that time; received 4weeks IV Ancef and decision to leave cardiac device.  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 She underwent single staged exchange arthroplasty on right knee by Dr. Veda Canning. Dr. Ladona Ridgel was not available for device extraction here so she was transferred to [facility] where she had cardiac device extracted on August 10th. She had blood cultures and device cultures done with no growth. PICC inserted on 08/18/16. She was sent out on IV cefazolin 2g IV q 8 but also IV rifampin 300 mg IV q 12 at SNF and then home. Managing FIVE infusions was overwhelming for her husband. I changed her to rifampin 300mg  orally BID and then Ceftriaxone  2gram IV push to complete 42 days of therapy post extraction. Initially her cardiac function had mproved quite nicely. We placed her on high dose keflex and bid rifampin and she has been on these meds without fail since I last saw her. She is approaching in December 4 months of systemic antimicrobials since extraction of her device and a litle more since I and D and exchange of liner of prosthesis.   Her EF had worsened and EP and Cardiology after careful consideration we implanted a biventricular pacemaker on January 17, 2017.  She continued on her cephalexin and rifampin.   I last saw her in January 2020 and she was doing well at that time as well.  Today we connected through telephone for an E visit.  She told me that she did have a bout of 1 day where her knee began to hurt more than usual and that day she took an extra dose of Keflex.  Her symptoms improved by the following day.  Her knee iwas now with improvement and nearly no pain by her account.  She tolerates her Keflex and rifampin without difficulties.  Her liver function tests were normal when they were checked this spring time.  Last connected with [name] [name] 05/27/2018.  Since then she has had 2 COVID-19 vaccines through 05/29/2018.  She was going to come to clinic today in person.  However her husband unfortunately offered an esophageal rupture and also a fractured arm on route to the ER.  He is currently in the ICU at [facility]. [name] [name] is staying at home with one of her children who is making sure that she is  well attended to.  She says that her prosthetic knee does not hurt any worse than before.  She has no fevers or other systemic symptoms.  She had labs done with Dr. Delton See which showed totally normal liver function tests in May 2021.      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  Sent prescription to her mail order pharmacy to help her save money on these prescriptions.  COVID-19 prevention she is welcome to make an appointment in our clinic to get her third COVID-19 vaccine from [employer]    Follow Up Instructions:    I discussed the assessment and treatment plan with the patient. The patient was provided an opportunity to ask questions and all were answered. The patient agreed with the plan and demonstrated an understanding of the instructions.   The patient was advised to call back or seek an in-person evaluation if the symptoms worsen or if the condition fails to improve as anticipated.  I provided 21 minutes of non-face-to-face time during this encounter.   [name], MD

## 2019-10-07 ENCOUNTER — Ambulatory Visit: Payer: Medicare Other | Admitting: Internal Medicine

## 2019-10-07 ENCOUNTER — Other Ambulatory Visit: Payer: Self-pay | Admitting: Infectious Disease

## 2019-10-07 DIAGNOSIS — T827XXA Infection and inflammatory reaction due to other cardiac and vascular devices, implants and grafts, initial encounter: Secondary | ICD-10-CM

## 2019-10-07 DIAGNOSIS — T8450XS Infection and inflammatory reaction due to unspecified internal joint prosthesis, sequela: Secondary | ICD-10-CM

## 2019-10-07 DIAGNOSIS — T8453XD Infection and inflammatory reaction due to internal right knee prosthesis, subsequent encounter: Secondary | ICD-10-CM

## 2019-11-01 ENCOUNTER — Telehealth: Payer: Self-pay | Admitting: Cardiology

## 2019-11-01 NOTE — Telephone Encounter (Signed)
° ° °  Pt would like to request if she can change her appt to virtual appt. She said she can't drive and also her husband.

## 2019-11-01 NOTE — Telephone Encounter (Signed)
Spoke with the patient who states that she would like her appointment to be virtual because her husband just got out of the hospital and neither of them can drive right now. The patient's husband also has an appointment with Dr. Delton See the same day and it has been changed to virtual already. I have switched hers as well.      Patient Consent for Virtual Visit         [name] [name] has provided verbal consent on 11/01/2019 for a virtual visit (video or telephone).   CONSENT FOR VIRTUAL VISIT FOR:  [name] [name]  By participating in this virtual visit I agree to the following:  I hereby voluntarily request, consent and authorize [facility] and its employed or contracted physicians, physician assistants, nurse practitioners or other licensed health care professionals (the Practitioner), to provide me with telemedicine health care services (the "Services") as deemed necessary by the treating Practitioner. I acknowledge and consent to receive the Services by the Practitioner via telemedicine. I understand that the telemedicine visit will involve communicating with the Practitioner through live audiovisual communication technology and the disclosure of certain medical information by electronic transmission. I acknowledge that I have been given the opportunity to request an in-person assessment or other available alternative prior to the telemedicine visit and am voluntarily participating in the telemedicine visit.  I understand that I have the right to withhold or withdraw my consent to the use of telemedicine in the course of my care at any time, without affecting my right to future care or treatment, and that the Practitioner or I may terminate the telemedicine visit at any time. I understand that I have the right to inspect all information obtained and/or recorded in the course of the telemedicine visit and may receive copies of available information for a reasonable fee.  I understand that  some of the potential risks of receiving the Services via telemedicine include:  [name] Delay or interruption in medical evaluation due to technological equipment failure or disruption; . Information transmitted may not be sufficient (e.g. poor resolution of images) to allow for appropriate medical decision making by the Practitioner; and/or  . In rare instances, security protocols could fail, causing a breach of personal health information.  Furthermore, I acknowledge that it is my responsibility to provide information about my medical history, conditions and care that is complete and accurate to the best of my ability. I acknowledge that Practitioner's advice, recommendations, and/or decision may be based on factors not within their control, such as incomplete or inaccurate data provided by me or distortions of diagnostic images or specimens that may result from electronic transmissions. I understand that the practice of medicine is not an exact science and that Practitioner makes no warranties or guarantees regarding treatment outcomes. I acknowledge that a copy of this consent can be made available to me via my patient portal [facility] MyChart), or I can request a printed copy by calling the office of [facility].    I understand that my insurance will be billed for this visit.   I have read or had this consent read to me. . I understand the contents of this consent, which adequately explains the benefits and risks of the Services being provided via telemedicine.  . I have been provided ample opportunity to ask questions regarding this consent and the Services and have had my questions answered to my satisfaction. . I give my informed consent for the services to be provided through  the use of telemedicine in my medical care

## 2019-11-02 ENCOUNTER — Ambulatory Visit (INDEPENDENT_AMBULATORY_CARE_PROVIDER_SITE_OTHER): Payer: Medicare Other

## 2019-11-02 DIAGNOSIS — I428 Other cardiomyopathies: Secondary | ICD-10-CM | POA: Diagnosis not present

## 2019-11-02 LAB — CUP PACEART REMOTE DEVICE CHECK
Battery Remaining Longevity: 88 mo
Battery Voltage: 2.98 V
Brady Statistic AP VP Percent: 19.72 %
Brady Statistic AP VS Percent: 0.26 %
Brady Statistic AS VP Percent: 77.29 %
Brady Statistic AS VS Percent: 2.74 %
Brady Statistic RA Percent Paced: 20.78 %
Brady Statistic RV Percent Paced: 23.24 %
Date Time Interrogation Session: 20211026011109
Implantable Lead Implant Date: 20190111
Implantable Lead Implant Date: 20190111
Implantable Lead Implant Date: 20190111
Implantable Lead Location: 753858
Implantable Lead Location: 753859
Implantable Lead Location: 753860
Implantable Lead Model: 5076
Implantable Lead Model: 5076
Implantable Pulse Generator Implant Date: 20190111
Lead Channel Impedance Value: 1615 Ohm
Lead Channel Impedance Value: 1634 Ohm
Lead Channel Impedance Value: 1653 Ohm
Lead Channel Impedance Value: 1672 Ohm
Lead Channel Impedance Value: 1710 Ohm
Lead Channel Impedance Value: 1729 Ohm
Lead Channel Impedance Value: 380 Ohm
Lead Channel Impedance Value: 399 Ohm
Lead Channel Impedance Value: 437 Ohm
Lead Channel Impedance Value: 494 Ohm
Lead Channel Impedance Value: 855 Ohm
Lead Channel Impedance Value: 893 Ohm
Lead Channel Impedance Value: 912 Ohm
Lead Channel Impedance Value: 912 Ohm
Lead Channel Pacing Threshold Amplitude: 0.5 V
Lead Channel Pacing Threshold Amplitude: 0.75 V
Lead Channel Pacing Threshold Amplitude: 2 V
Lead Channel Pacing Threshold Pulse Width: 0.4 ms
Lead Channel Pacing Threshold Pulse Width: 0.4 ms
Lead Channel Pacing Threshold Pulse Width: 1 ms
Lead Channel Sensing Intrinsic Amplitude: 1.625 mV
Lead Channel Sensing Intrinsic Amplitude: 1.625 mV
Lead Channel Sensing Intrinsic Amplitude: 9.875 mV
Lead Channel Sensing Intrinsic Amplitude: 9.875 mV
Lead Channel Setting Pacing Amplitude: 2 V
Lead Channel Setting Pacing Amplitude: 2.5 V
Lead Channel Setting Pacing Amplitude: 2.5 V
Lead Channel Setting Pacing Pulse Width: 0.4 ms
Lead Channel Setting Pacing Pulse Width: 1 ms
Lead Channel Setting Sensing Sensitivity: 0.9 mV

## 2019-11-04 ENCOUNTER — Encounter: Payer: Self-pay | Admitting: Cardiology

## 2019-11-04 ENCOUNTER — Telehealth: Payer: Self-pay | Admitting: *Deleted

## 2019-11-04 ENCOUNTER — Encounter: Payer: Self-pay | Admitting: *Deleted

## 2019-11-04 ENCOUNTER — Other Ambulatory Visit: Payer: Self-pay | Admitting: *Deleted

## 2019-11-04 ENCOUNTER — Other Ambulatory Visit: Payer: Self-pay

## 2019-11-04 ENCOUNTER — Telehealth (INDEPENDENT_AMBULATORY_CARE_PROVIDER_SITE_OTHER): Payer: Medicare Other | Admitting: Cardiology

## 2019-11-04 VITALS — BP 106/67 | HR 82 | Ht <= 58 in | Wt 154.0 lb

## 2019-11-04 DIAGNOSIS — R7989 Other specified abnormal findings of blood chemistry: Secondary | ICD-10-CM

## 2019-11-04 DIAGNOSIS — I428 Other cardiomyopathies: Secondary | ICD-10-CM

## 2019-11-04 DIAGNOSIS — Z79899 Other long term (current) drug therapy: Secondary | ICD-10-CM

## 2019-11-04 DIAGNOSIS — Z95810 Presence of automatic (implantable) cardiac defibrillator: Secondary | ICD-10-CM

## 2019-11-04 DIAGNOSIS — I5022 Chronic systolic (congestive) heart failure: Secondary | ICD-10-CM

## 2019-11-04 DIAGNOSIS — I5043 Acute on chronic combined systolic (congestive) and diastolic (congestive) heart failure: Secondary | ICD-10-CM

## 2019-11-04 NOTE — Progress Notes (Signed)
Cardiology Office Note   Date:  11/07/2019   ID:  [name] [name], [name] [name] April 10, 1935, MRN [id number]  Patient Location: Home Provider Location: Home  PCP:  [name], MD  Cardiologist:  Tobias Alexander, MD  Electrophysiologist: Dr Graciela Husbands  Evaluation Performed:  Follow-Up Visit  Reason for visit: 5 month follow-up  History of Present Illness:    [name] [name] is a 84 y.o. female with h/o nonischemic cardiomyopathy, left bundle branch block, nonsustained ventricular tachycardia, HTN and pancreatitis.   She underwent CRT Biotronik implantation  06/2014.  06/2016, she was  hospitalized and found to have staph bacteremia of (MSSA) in the wake of orthopedic surgery. She developed recurrent 'septic arthritis" and bacteremia and TEE confirmed Vegetation on RA lead. She was transferred to [facility] for extraction which she tolerated well.   Rx with 8 weeks of IV Abx, most recently 10/18 keflex and rifampin.  Because of worsening left ventricular function and worsening functional status, she underwent CRT reimplantation 01/2017, aware that she would need long-term chronic suppressive antibiotics. Per. Dr. Odessa Fleming recent OV note, she has been considerably better following CRT implantation.  There is less shortness of breath and more energy.  Repeat echo has not yet been done.  Her Synthroid dosage has been increased overall the last year as TSH remains high and free T3 and free T4 low, because of her husband prolonged hospitalization the summer she missed follow-up appointment with endocrinologist.  Today she states that she is overall doing well and has no lower extremity edema, because of stress associated with her husband hospitalization she has lost significant amount of weight 15 pounds, she does occasional proximal nocturnal dyspnea and admits that she feels slightly more dyspneic on exertion. No chest pain. No palpitation dizziness or syncope. She has been compliant with her  medications.  Prior CV studies:   The following studies were reviewed today:  08/14/16: TEE Study Conclusions - Left ventricle: Systolic function was normal. The estimated ejection fraction was in the range of 55% to 60%. Wall motion was normal; there were no regional wall motion abnormalities. - Aortic valve: No evidence of vegetation. There was mild regurgitation. - Aorta: There was mild non-mobile atheroma. - Mitral valve: There was mild regurgitation. - Left atrium: No evidence of thrombus in the atrial cavity or appendage. No evidence of thrombus in the atrial cavity or appendage. - Right atrium: No evidence of thrombus in the atrial cavity or appendage. No evidence of thrombus in the atrial cavity or appendage. - Tricuspid valve: No evidence of vegetation. There was mild regurgitation. - Pulmonic valve: No evidence of vegetation. Impressions: - There is a highly mobile echodensity measuring 13 x 3 mm attached to the right atrial lead in the right atrium at the tricuspid valve level. This is consistent with pacemaker endocarditis.  10/17/15: limited echo for AV optimization Impressions: - The study is performed for AV optimization There is grade 1 diastolic dysfunction The optimal AV delay seems to be around 90-100 ms but i would defer to the doctors managing the pacer.  02/01/15: TTE Study Conclusions - Left ventricle: The cavity size was normal. There was moderate concentric hypertrophy. Systolic function was normal. The estimated ejection fraction was in the range of 50% to 55%. Wall motion was normal; there were no regional wall motion abnormalities. Doppler parameters are consistent with abnormal left ventricular relaxation (grade 1 diastolic dysfunction). The E/e&' ratio is >15, suggesting elevated LV filling pressure. - Mitral valve: Calcified annulus. Mildly  thickened leaflets . There was trivial regurgitation. - Left  atrium: The atrium was normal in size. - Right ventricle: The cavity size was normal. Wall thickness was normal. Pacer wire or catheter noted in right ventricle. Systolic function was normal. - Right atrium: The atrium was normal in size. Pacer wire or catheter noted in right atrium. - Tricuspid valve: There was moderate regurgitation. - Pulmonary arteries: PA peak pressure: 30 mm Hg (S). - Inferior vena cava: The vessel was normal in size. The respirophasic diameter changes were in the normal range (= 50%), consistent with normal central venous pressure. Impressions: - Compared to a prior study in 09/2014, the EF has improved to 50-55%.  The patient does not have symptoms concerning for COVID-19 infection (fever, chills, cough, or new shortness of breath).   Past Medical History:  Diagnosis Date  . 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 Acute lower GI bleeding 05/06/2017  . Acute lower GI bleeding 05/06/2017  . Anemia 05/06/2017  . Arthritis    "finger joints" (06/21/2016)  . Asthma   . CHF (congestive heart failure) (HCC)   . Congestive dilated cardiomyopathy (HCC) 03/31/2014   a. s/p Biotronik CRTD 06/2014  . GERD (gastroesophageal reflux disease)   . Headache   . History of blood transfusion 1990s   "related to hysterectomy"  . Hypothyroidism   . Left bundle branch block   . MSSA bacteremia 06/21/2016  . Myocardial infarction (HCC)   . Pancreatitis, acute    Past Surgical History:  Procedure Laterality Date  . ABDOMINAL HYSTERECTOMY    . APPENDECTOMY    . BIV PACEMAKER INSERTION CRT-P N/A 01/17/2017   Procedure: BIV PACEMAKER INSERTION CRT-P;  Surgeon: Duke Salvia, MD;  Location: [facility] INVASIVE CV LAB;  Service: Cardiovascular;  Laterality: N/A;  . CARDIAC CATHETERIZATION    . CARDIAC DEFIBRILLATOR PLACEMENT  ?2016  . CATARACT EXTRACTION W/ INTRAOCULAR LENS  IMPLANT,  BILATERAL Bilateral   . CHOLECYSTECTOMY OPEN     "related to pancreatitis"  . DILATION AND CURETTAGE OF UTERUS    . EP IMPLANTABLE DEVICE N/A 07/04/2014   Procedure: BiV ICD Insertion CRT-D;  Surgeon: Duke Salvia, MD;  Location: [facility] INVASIVE CV LAB;  Service: Cardiovascular;  Laterality: N/A;  . ESOPHAGOGASTRODUODENOSCOPY (EGD) WITH PROPOFOL N/A 05/08/2017   Procedure: ESOPHAGOGASTRODUODENOSCOPY (EGD) WITH PROPOFOL;  Surgeon: Graylin Shiver, MD;  Location[facility] ENDOSCOPY;  Service: Endoscopy;  Laterality: N/A;  . HERNIA REPAIR    . I & D KNEE WITH POLY EXCHANGE Right 08/11/2016   Procedure: IRRIGATION AND DEBRIDEMENT KNEE WITH POLY EXCHANGE;  Surgeon: Samson Frederic, MD;  Location: [facility] OR;  Service: Orthopedics;  Laterality: Right;  . INSERT / REPLACE / REMOVE PACEMAKER  ?2016  . JOINT REPLACEMENT Bilateral    "thumb joints; used my own material"  . JOINT REPLACEMENT    . LEFT HEART CATHETERIZATION WITH CORONARY ANGIOGRAM N/A 11/02/2013   Procedure: LEFT HEART CATHETERIZATION WITH CORONARY ANGIOGRAM;  Surgeon: Corky Crafts, MD;  Location: [facility] CATH LAB;  Service: Cardiovascular;  Laterality: N/A;  . LOOP RECORDER IMPLANT N/A 04/13/2014   Procedure: LOOP RECORDER IMPLANT;  Surgeon: Duke Salvia, MD;  Location: [facility] CATH LAB;  Service: Cardiovascular;  Laterality: N/A;  . LOOP RECORDER REMOVAL  ?2016  . POSTERIOR LUMBAR FUSION  05/03/2016   Lumbar Two-Three, Lumbar Three-Four Posterior Lumbar Fusion withLaminectomy and Foraminotomy  .  SPINE SURGERY  04/2016   lumbar   . TEE WITHOUT CARDIOVERSION N/A 08/14/2016   Procedure: TRANSESOPHAGEAL ECHOCARDIOGRAM (TEE);  Surgeon: Lars Masson, MD;  Location: [facility] ENDOSCOPY;  Service: Cardiovascular;  Laterality: N/A;  . TONSILLECTOMY    . TOTAL HIP ARTHROPLASTY Bilateral   . UMBILICAL HERNIA REPAIR  1970s?     Current Meds  Medication Sig  . albuterol (PROAIR HFA) 108 (90 Base) MCG/ACT inhaler Inhale 2 puffs into the lungs every 6 (six) hours as  needed for wheezing or shortness of breath.  . Aspirin-Acetaminophen-Caffeine (EXCEDRIN PO) Take 1 tablet by mouth as needed.  . betamethasone dipropionate (DIPROLENE) 0.05 % ointment Apply 1 application topically daily as needed.  . Calcium Carbonate Antacid (TUMS PO) Take 1 tablet by mouth daily as needed (indigestion).   . carvedilol (COREG) 12.5 MG tablet Take 1 tablet (12.5 mg total) by mouth 2 (two) times daily.  . cephALEXin (KEFLEX) 500 MG capsule Take 2 capsules (1,000 mg total) by mouth 2 (two) times daily.  [name] ENTRESTO 24-26 MG TAKE 1 TABLET TWICE A DAY  . famotidine (PEPCID) 20 MG tablet Take 20 mg by mouth 2 (two) times daily as needed.  . feeding supplement, ENSURE ENLIVE, (ENSURE ENLIVE) LIQD Take 237 mLs by mouth 2 (two) times daily between meals.  . Fluticasone-Salmeterol (ADVAIR) 250-50 MCG/DOSE AEPB Inhale 1 puff into the lungs 2 (two) times daily.  . furosemide (LASIX) 40 MG tablet TAKE 1 TABLET DAILY (DOSE INCREASED)  . levothyroxine (SYNTHROID) 175 MCG tablet Take 1 tablet (175 mcg total) by mouth daily before breakfast.  . Melatonin 3 MG TABS Take 3 mg by mouth at bedtime as needed (for sleep.).   [name] methocarbamol (ROBAXIN) 500 MG tablet Take 1 tablet (500 mg total) by mouth every 6 (six) hours as needed for muscle spasms.  . methylphenidate (RITALIN) 20 MG tablet Take 40 mg by mouth daily.   March Rummage (ALLERGY EYE OP) Place 1-2 drops into both eyes daily as needed (for irritated eyes).  . naproxen (NAPROSYN) 500 MG tablet Take 500 mg by mouth 2 (two) times daily as needed.  . ondansetron (ZOFRAN) 8 MG tablet Take 8 mg by mouth as needed.  . pantoprazole (PROTONIX) 40 MG tablet Take 40 mg by mouth daily.  . polyethylene glycol (MIRALAX / GLYCOLAX) packet Take 17 g by mouth daily as needed for mild constipation.  . rifampin (RIFADIN) 300 MG capsule TAKE 1 CAPSULE BY MOUTH EVERY 12 HOURS  . sucralfate (CARAFATE) 1 g tablet Take 1 g by mouth daily.     Allergies:   Sulfa antibiotics   Social History   Tobacco Use  . Smoking status: Never Smoker  . Smokeless tobacco: Never Used  . Tobacco comment: "smoked as a teenager; for maybe 1 wk"  Vaping Use  . Vaping Use: Never used  Substance Use Topics  . Alcohol use: Yes    Comment: 6/15//2018 "glass of wine q 6 months or so"  . Drug use: No    Family Hx: The patient's family history includes Arrhythmia in her mother; Heart attack in her father, maternal grandfather, and paternal grandfather; Heart disease in her father; Hyperlipidemia in her mother.  ROS:   Please see the history of present illness.    All other systems reviewed and are negative.  Labs/Other Tests and Data Reviewed:    EKG:  No ECG reviewed.  Recent Labs: 02/05/2019: Hemoglobin 12.8; Platelets 169 05/20/2019: ALT 11; BUN 28; Creatinine, Ser 1.26;  NT-Pro BNP 110; Potassium 4.7; Sodium 139 07/05/2019: TSH 30.79   Recent Lipid Panel Lab Results  Component Value Date/Time   CHOL 171 05/06/2017 03:51 PM   TRIG 127 05/06/2017 03:51 PM   HDL 43 05/06/2017 03:51 PM   CHOLHDL 4.0 05/06/2017 03:51 PM   LDLCALC 103 (H) 05/06/2017 03:51 PM   Wt Readings from Last 3 Encounters:  11/04/19 154 lb (69.9 kg)  07/05/19 169 lb 9.6 oz (76.9 kg)  05/20/19 169 lb (76.7 kg)    Objective:    Vital Signs:  BP 106/67   Pulse 82   Ht 4\' 10"  (1.473 m)   Wt 154 lb (69.9 kg)   LMP  (LMP Unknown)   BMI 32.19 kg/m    VITAL SIGNS:  reviewed    ASSESSMENT & PLAN:    Acute on chronic chronic systolic HF:  -OptiVol showed decreased volume recently -I will obtain labs including BMP and BNP and for now continue the same dose of furosemide 40 mg daily. -Her weight is down 15 pounds. -We will obtain BMP and BNP today. -Her echocardiogram performed in February showed LVEF 30-35%, unchanged from prior. -I will see her in person in 6 weeks to be able to examine her.  Hypothyroidism -Her TSH has been persistently elevated.  We  have been increasing the dose of Synthroid. We will obtain repeat TSH free T3 and free T4, she is advised to follow-up with her endocrinologist.  Nonischemic cardiomyopathy  - LVEF 30-35 % on the most recent echocardiogram from February 2021, initially improvement to 50 to 55% with a BiV ICD, however her device get infected had to be removed she received another device in January 2019, post explantation. -She is on Coreg 12.5 mg p.o. twice daily, Entresto 24/26 mg, she could not tolerate spironolactone.  BiV/ICD: history outline above in HPI. Required extraction due to bacteremia/lead vegetation. Underwent re-implantation 01/2017.  Followed by Dr. Graciela Husbands. Functionally she seemed to be doing quite well following CRT implantation.  QRS is considerably narrowed.    COVID-19 Education: The signs and symptoms of COVID-19 were discussed with the patient and how to seek care for testing (follow up with PCP or arrange E-visit).  The importance of social distancing was discussed today.  Time:   Today, I have spent 25 minutes with the patient with telehealth technology discussing the above problems.    Medication Adjustments/Labs and Tests Ordered: Current medicines are reviewed at length with the patient today.  Concerns regarding medicines are outlined above.   Tests Ordered: No orders of the defined types were placed in this encounter.   Medication Changes: No orders of the defined types were placed in this encounter.   Disposition:  Follow up in 4 month(s)  Signed, Tobias Alexander, MD  11/07/2019 10:25 PM    [facility]

## 2019-11-04 NOTE — Patient Instructions (Signed)
Medication Instructions:   Your physician recommends that you continue on your current medications as directed. Please refer to the Current Medication list given to you today.  *If you need a refill on your cardiac medications before your next appointment, please call your pharmacy*  Lab Work:  WE WILL BE SENDING [facility] TO COME OUT TO YOUR HOME TO OBTAIN YOU LABS --YOU WILL BE GETTING A CALL SOON BY OUR OFFICE TO GET THIS ARRANGED--THEY WILL CHECK CMET, CBC, TSH, FREE T3, FREE T4,  AND PRO-BNP.  If you have labs (blood work) drawn today and your tests are completely normal, you will receive your results only by: [name] MyChart Message (if you have MyChart) OR . A paper copy in the mail If you have any lab test that is abnormal or we need to change your treatment, we will call you to review the results.   Follow-Up:  WITH DR. Delton See IN THE OFFICE ON Wednesday 12/22/19 AT 4:20 PM

## 2019-11-04 NOTE — Telephone Encounter (Signed)
[facility] Home Visit Initial Request  Date of Request (DOR):  November 04, 2019  Requesting Provider:  Tobias Alexander, MD    Agency Requested:    [facility] Contact:  Dara Hoyer, RN, CCM [address], [address] [place], [place]  [address] [email or web address]  Phone #: [phone] Fax #: [phone]  Patient Demographic Information: Name:  [name] [name] Age:  84 y.o.   DOB:  25-Nov-1935  MRN:  [id number]   Address:   [address] [place] [address]   Phone Numbers:   Home Phone [phone]  Mobile [phone]     Emergency Contact Information on File:   Contact Information    Name Relation Home Work Mobile   Bossi,Charles Spouse [phone]  [phone]   Cecil,Tina Daughter [phone]  [phone]      The above family members may be contacted for information on this patient (review DPR on file):  Yes    Patient Clinical Information:  Primary Care Provider:  [name], MD  Primary Cardiologist:  Tobias Alexander, MD  Primary Electrophysiologist:  None   Past Medical Hx: Ms. Bevans  has a past medical history of Acrodermatitis continua of [place], Acute lower GI bleeding (05/06/2017), Acute lower GI bleeding (05/06/2017), Anemia (05/06/2017), Arthritis, Asthma, CHF (congestive heart failure) (HCC), Congestive dilated cardiomyopathy (HCC) (03/31/2014), GERD (gastroesophageal reflux disease), Headache, History of blood transfusion (1990s), Hypothyroidism, Left bundle branch block, MSSA bacteremia (06/21/2016), Myocardial infarction (HCC), and Pancreatitis, acute.   Allergies: She is allergic to sulfa antibiotics.   Medications: Current Outpatient Medications on File Prior to Visit  Medication Sig  . albuterol (PROAIR HFA) 108 (90 Base) MCG/ACT inhaler Inhale 2 puffs into the lungs every 6 (six) hours as needed for wheezing or shortness of breath.  . Aspirin-Acetaminophen-Caffeine (EXCEDRIN  PO) Take 1 tablet by mouth as needed.  . betamethasone dipropionate (DIPROLENE) 0.05 % ointment Apply 1 application topically daily as needed.  . Calcium Carbonate Antacid (TUMS PO) Take 1 tablet by mouth daily as needed (indigestion).   . carvedilol (COREG) 12.5 MG tablet Take 1 tablet (12.5 mg total) by mouth 2 (two) times daily.  . cephALEXin (KEFLEX) 500 MG capsule Take 2 capsules (1,000 mg total) by mouth 2 (two) times daily.  [name] ENTRESTO 24-26 MG TAKE 1 TABLET TWICE A DAY  . famotidine (PEPCID) 20 MG tablet Take 20 mg by mouth 2 (two) times daily as needed.  . feeding supplement, ENSURE ENLIVE, (ENSURE ENLIVE) LIQD Take 237 mLs by mouth 2 (two) times daily between meals.  . Fluticasone-Salmeterol (ADVAIR) 250-50 MCG/DOSE AEPB Inhale 1 puff into the lungs 2 (two) times daily.  . furosemide (LASIX) 40 MG tablet TAKE 1 TABLET DAILY (DOSE INCREASED)  . levothyroxine (SYNTHROID) 175 MCG tablet Take 1 tablet (175 mcg total) by mouth daily before breakfast.  . Melatonin 3 MG TABS Take 3 mg by mouth at bedtime as needed (for sleep.).   [name] methocarbamol (ROBAXIN) 500 MG tablet Take 1 tablet (500 mg total) by mouth every 6 (six) hours as needed for muscle spasms.  . methylphenidate (RITALIN) 20 MG tablet Take 40 mg by mouth daily.   March Rummage (ALLERGY EYE OP) Place 1-2 drops into both eyes daily as needed (for irritated eyes).  . naproxen (NAPROSYN) 500 MG tablet Take 500 mg by mouth 2 (two) times daily as needed.  . nitroGLYCERIN (NITROSTAT) 0.4 MG SL tablet Place 1 tablet (0.4 mg total) every 5 (five) minutes  as needed under the tongue for chest pain.  [name] ondansetron (ZOFRAN) 8 MG tablet Take 8 mg by mouth as needed.  . pantoprazole (PROTONIX) 40 MG tablet Take 40 mg by mouth daily.  . polyethylene glycol (MIRALAX / GLYCOLAX) packet Take 17 g by mouth daily as needed for mild constipation.  . rifampin (RIFADIN) 300 MG capsule TAKE 1 CAPSULE BY MOUTH EVERY 12 HOURS  . sucralfate  (CARAFATE) 1 g tablet Take 1 g by mouth daily.   No current facility-administered medications on file prior to visit.     Social Hx: She  reports that she has never smoked. She has never used smokeless tobacco. She reports current alcohol use. She reports that she does not use drugs.    Diagnosis/Reason for Visit:   CHF, DILATED CARDIOMYOPATHY  Services Requested:  Vital Signs (BP, Pulse, O2, Weight)  Physical Exam  Medication Reconciliation  Labs:  CMET, CBC, TSH, FREE T3, FREE T4, PRO-BNP  # of Visits Needed/Frequency per Week: 5

## 2019-11-04 NOTE — Telephone Encounter (Signed)
This encounter was created in error - please disregard.

## 2019-11-05 NOTE — Telephone Encounter (Signed)
Darlene from [facility] [employer] [facility] [employer] will not cover lab draws at home for a one time visit. She states they will cover it if there is something else she needs as well. She states they will need documentation from the physician stating what is wrong with the patient and what needs to be done.

## 2019-11-05 NOTE — Telephone Encounter (Signed)
I called Darlene with [facility] and informed her that the request does state for 5 visits as well it has been requested for physical exam, med rec and vital signs. Darlene states they were confused about what needed to be done. I assured Agustin Cree it is all in the request in the Services Requested:, as well as the Dx's are in the referral. Agustin Cree said she will try to get the pt on the same day they are going out to see pt's husband as well.

## 2019-11-08 NOTE — Progress Notes (Signed)
Remote pacemaker transmission.   

## 2019-11-12 ENCOUNTER — Telehealth: Payer: Self-pay | Admitting: Cardiology

## 2019-11-12 NOTE — Telephone Encounter (Signed)
FYI-- Luther Parody with [facility] called to make Dr. Delton See aware that the patient's lab work will be completed on 11/14/19 and they will fax results to our office on 11/15/19.

## 2019-11-15 ENCOUNTER — Telehealth: Payer: Self-pay | Admitting: Cardiology

## 2019-11-15 NOTE — Telephone Encounter (Signed)
Verbal orders given to Tonya at [facility] for the pt to receive nursing once a week for 3 weeks, and one time in Dec for labs and Physical therapy. Margarito Courser to fax any orders she needs for Dr. Delton See to review and sign off on.  Tonya verbalized understanding and agrees with this plan.  Will send this message to Dr. Delton See as an Lorain Childes, to make her aware of this plan.

## 2019-11-15 NOTE — Telephone Encounter (Signed)
Will send to Dr Nelson as an FYI.  

## 2019-11-15 NOTE — Telephone Encounter (Signed)
[name] [name] with [facility] is requesting that Dr. Delton See approves orders for nursing once a week for 3 weeks, one time in December and one PRN for CHF and labs. Patient also needs physical therapy and has agreed if approved.   Please call/advise.   Thank you!

## 2019-11-17 ENCOUNTER — Telehealth: Payer: Self-pay | Admitting: *Deleted

## 2019-11-17 NOTE — Telephone Encounter (Signed)
Pt aware of lab results and recommendations per Dr. Delton See, for her to continue her current med regimen. Pt verbalized understanding and agrees with this plan.

## 2019-11-17 NOTE — Telephone Encounter (Signed)
-----   Message from Lars Masson, MD sent at 11/17/2019  4:53 PM EST ----- I have reviewed her labs and they all look great, her TSH is still elevated but improved from 30-17, but free T4 is now normal Kidney function and electrolytes are normal, her BNP what is a sign of heart failure is completely normal and her hemoglobin is normal extremities are all great results so I will just continue the same medicines that she is taking now.

## 2019-11-19 ENCOUNTER — Telehealth: Payer: Self-pay | Admitting: Cardiology

## 2019-11-19 NOTE — Telephone Encounter (Signed)
Will send to our HIM Dept

## 2019-11-19 NOTE — Telephone Encounter (Signed)
New message:    Agustin Cree from [facility] calling to see if she can get the notes from pt last visit on 11/04/19. She need all for patient insurance fax number [phone].

## 2019-11-23 NOTE — Telephone Encounter (Signed)
Office visit note on 11/04/19 faxed to [facility]

## 2019-11-24 ENCOUNTER — Telehealth: Payer: Self-pay | Admitting: Cardiology

## 2019-11-24 NOTE — Telephone Encounter (Signed)
Spoke with Loraine Leriche PT at [facility].  Gave him verbal orders on behalf of Dr. Delton See, for him to do PT on the pt in the home for recommended of time he requested.  Loraine Leriche states they will fax the orders to our office sometime to have Dr. Delton See sign.  Informed Loraine Leriche that she will not be back in the office for a week or so, but I will have her sign them then, and refax back to his contact number he provides.  Loraine Leriche states this is no problem at all.  Loraine Leriche verbalized understanding and agrees with this plan.  Loraine Leriche was gracious for all the assistance provided.

## 2019-11-24 NOTE — Telephone Encounter (Signed)
Loraine Leriche is calling requesting orders for the patient to have home health physical therapy 2x a week for 6 weeks starting today. He states if he does not answer when calling back the orders can be left on his VM. Please advise.

## 2019-12-20 ENCOUNTER — Telehealth: Payer: Self-pay | Admitting: Internal Medicine

## 2019-12-20 ENCOUNTER — Encounter: Payer: Self-pay | Admitting: Cardiology

## 2019-12-20 NOTE — Telephone Encounter (Signed)
error 

## 2019-12-20 NOTE — Telephone Encounter (Signed)
Patient's husband states the patient is in [facility]. He states she is in surgery right now because her knee was infected again. He would like to speak with a nurse to update how she is doing in the hospital.

## 2019-12-20 NOTE — Telephone Encounter (Signed)
Pts Husband is calling to let Dr. Delton See and Dr. Graciela Husbands know that the pt has been admitted to [facility] in the MICU unit, for reinfection of her right knee.  Pts Husband states that she is currently in surgery at [facility], getting arthrotomy of her right knee, due to reinfection and sepsis.  Husband called to cancel both their appts with Dr. Delton See scheduled for Wednesday 12/15.  He states they will call back at a later time to get them rescheduled,  when pts wife gets discharged.  Pts Husband especially wanted to make Dr. Delton See and Dr. 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  Pts Husband is also wanted to make Dr. Graciela Husbands aware of this, for he follows and maintains her pacemaker.   Cancelled both the pt and and Husband's appt with [place] on Wednesday.  Advised him to call back when they are ready, and we will be glad to accommodate rescheduling their appointments.  Informed the pts Husband that I will route this information to Dr. Delton See, Dr. Graciela Husbands, and our device team, to make them all aware of pt being currently admitted for sepsis related to reinfection of her knee, incase further assistance may be needed in that area.  Pts Husband verbalized understanding and agrees with this plan. Husband was gracious for all the assistance provided.  Kindest regards provided to the pts Husband.

## 2019-12-22 ENCOUNTER — Ambulatory Visit: Payer: Medicare Other | Admitting: Cardiology

## 2019-12-22 NOTE — Telephone Encounter (Signed)
Patient's husband calling back in to update Dr. Delton See and Dr. Graciela Husbands know the status of his wife's condition. She is being kept at [facility] as her pacemaker device is now said to be infected. He would like if a nurse would call him back so he can provide an update with details as to what is going on. He also stated that  2-3 years ago she had an infection in the pacemaker and they replaced it at [facility] and said they couldn't replace it again [facility] is saying they will replace it in the morning. He wants to make sure that it is okay to replace.   Thank you!

## 2019-12-22 NOTE — Telephone Encounter (Signed)
Spoke with patient's husband and informed him that [facility] is able to see patient's records from past visits to [facility] as we can see the records from [facility]. Family voiced that they were reassured by phone call and treatment plan at [facility].

## 2019-12-22 NOTE — Telephone Encounter (Signed)
Please tell Mr. [name] [name] really sorry and we are thinking about them both especially [name] and we hope that everything goes well tomorrow and will be thinking about her. Lajoyce Corners and [employer]

## 2019-12-24 IMAGING — CR DG CHEST 2V
2 series · 2 of 2 positions shown · non-contrast
Comparison: 01/18/2017

CLINICAL DATA: Syncope, chest pain

EXAM:
CHEST - 2 VIEW

[chest lat]
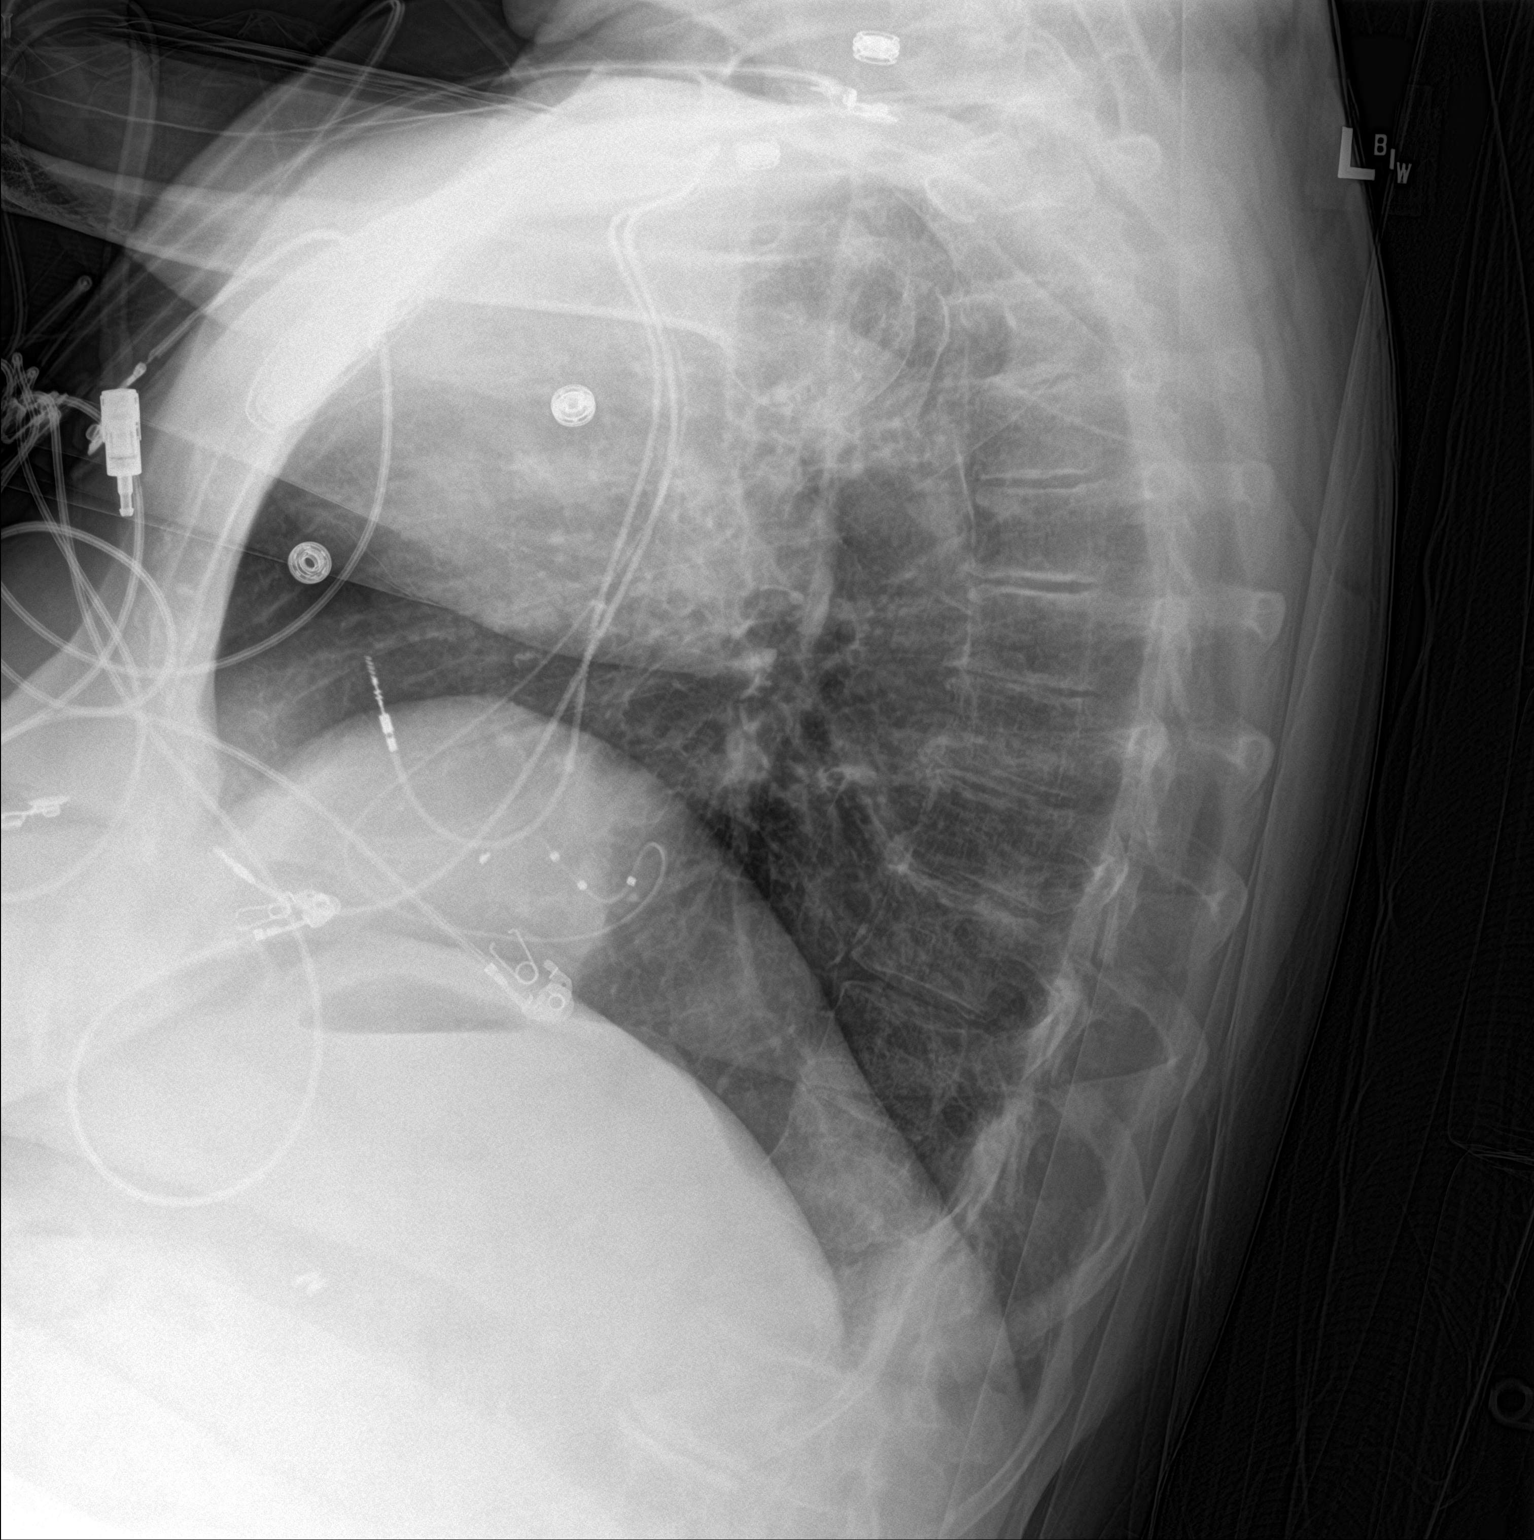

[chest ap]
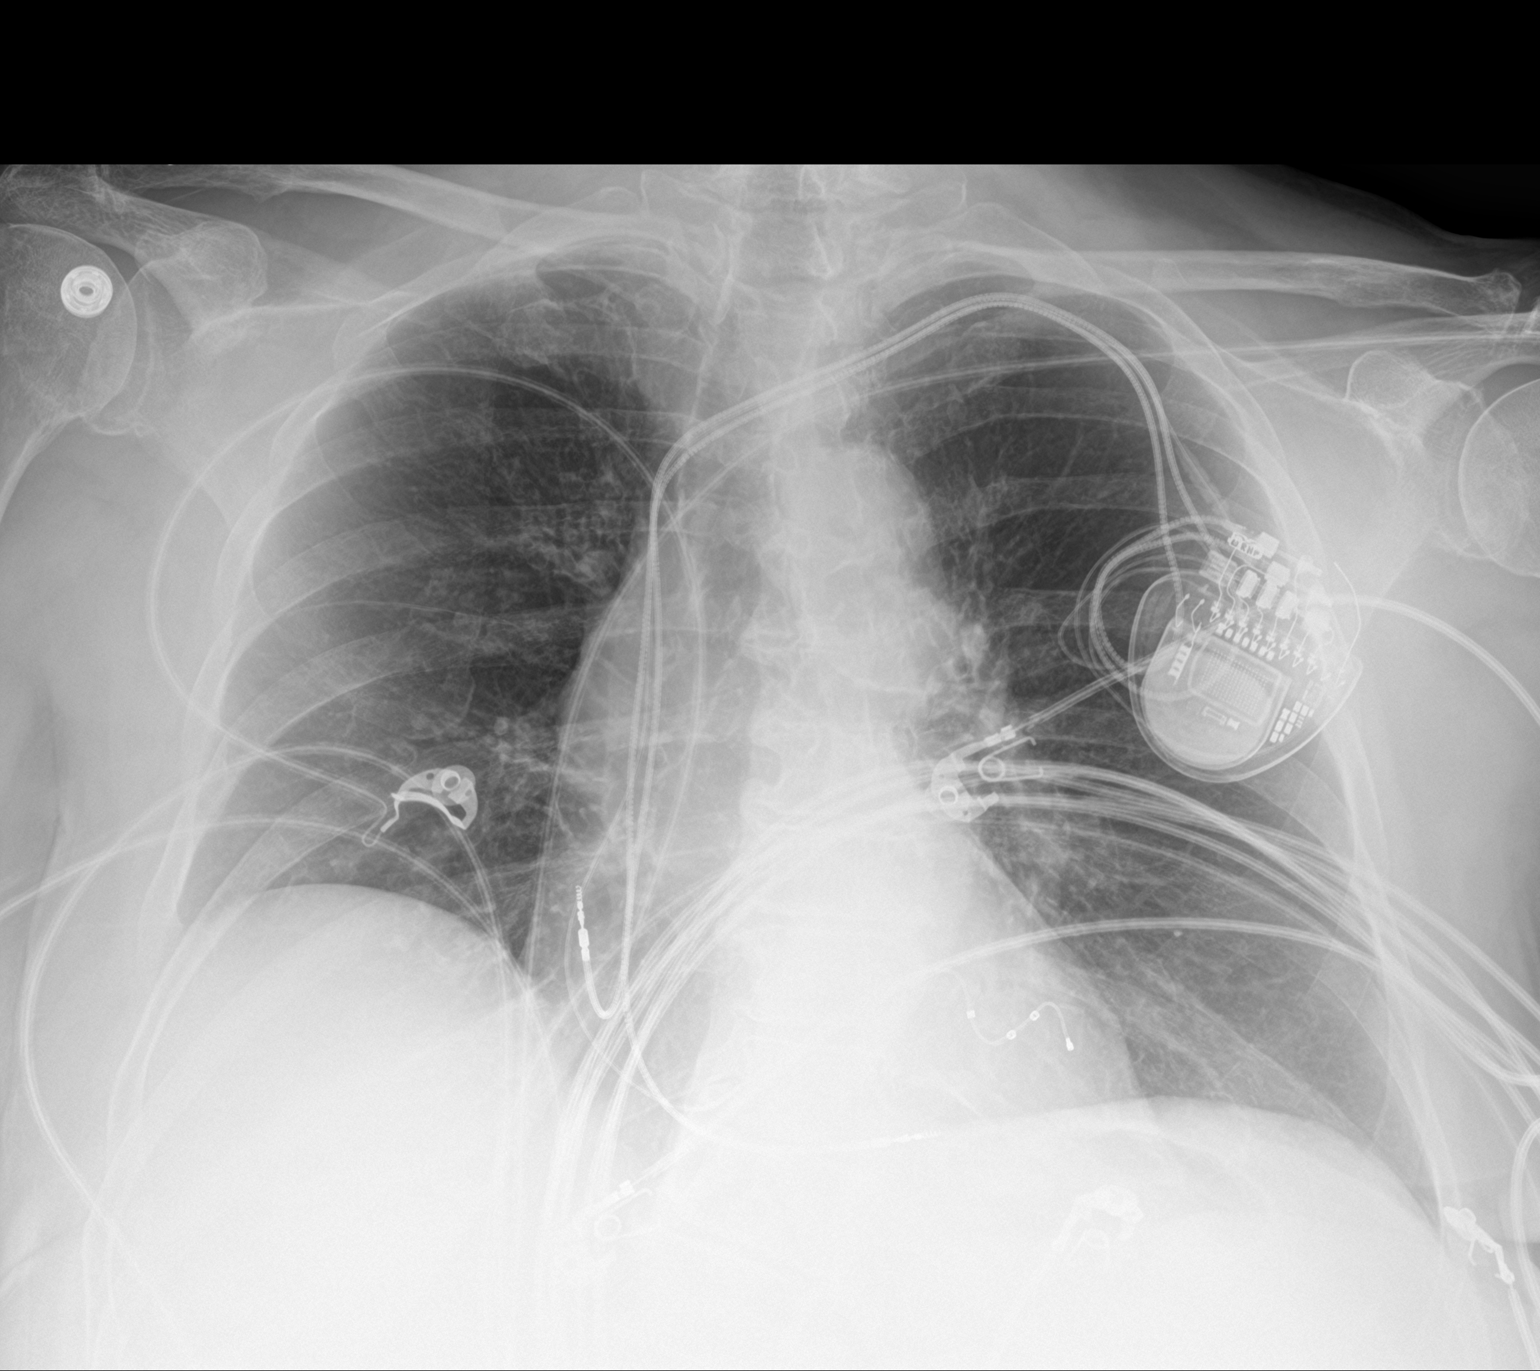

[2 of 2 positions shown; findings below may reference images not displayed]

FINDINGS: Lungs are clear.  No pleural effusion or pneumothorax.

Mild eventration of the hemidiaphragm.

The heart is top-normal in size.  Left subclavian pacemaker.

Degenerative changes of the visualized thoracolumbar spine.
IMPRESSION: No evidence of acute cardiopulmonary disease.

## 2019-12-27 NOTE — Telephone Encounter (Signed)
Noted  [facility] records reviewed

## 2020-01-03 ENCOUNTER — Telehealth: Payer: Self-pay | Admitting: Cardiology

## 2020-01-11 ENCOUNTER — Telehealth: Payer: Self-pay | Admitting: Cardiology

## 2020-01-11 NOTE — Telephone Encounter (Signed)
Left a detailed message on [name] [name]'s confirmed VM that we prefer infectious disease provide verbal orders for nursing to go into the home for the next 8 weeks, due to pts recent hospital admission for sepsis.  Fabio Bering on her voicemail to call us back if obtaining requested verbal orders from infectious disease is an issue, and we will provide those requested orders instead.  Left her a detailed message to call us back and leave a message with our operators with where verbal orders will need to obtained from.

## 2020-01-11 NOTE — Telephone Encounter (Signed)
Cara with [facility] is calling to get verbal orders to continue nursing for 1x a week for 8 weeks. She wasn't sure if Dr. Delton See wanted them to go through [place] for this or Infectious Disease. Please advise.

## 2020-01-12 NOTE — Telephone Encounter (Signed)
Mark with [facility] is requesting a verbal order for the patient to begin home health physical therapy. Per Loraine Leriche, therapy will be 1x for 1 week, 2x for 5 weeks, and 1x for 2 weeks. He is requesting the order from Dr. Delton See.

## 2020-01-12 NOTE — Telephone Encounter (Signed)
Spoke with [name] [name] at [facility] and informed him that we would be ok providing verbal orders, however, the pt is under close care of her Orthopedic Surgeon at [facility] for recent knee surgery related to infection in that area.  Informed [name] [name] that the pt is under an Orthopedic Surgeon at [facility] for this and with Infectious disease MD for IV antibiotic infusion while she is at home, due to pt having sepsis related to recent knee infection she was admitted to the hospital at [facility] for.  Informed [name] [name] that I spoke with the pt this morning and she states that she will be seeing her Orthopedic Surgeon tomorrow for follow-up of knee surgery, and to reassess if she can have her stiches removed or not.  [name] [name] to reach out to the pts Orthopedic Surgeon at [facility] and inquire about getting requested verbal orders for therapy, for this would be the safest option for the pt, being he is needing to rehab her for recent knee surgery and sepsis in her knee. [name] [name] to call the pt and she will be able to give him her Orthopedic MDs name and contact number at [facility].  Informed [name] [name] that if there is an issue with getting verbal orders, then he can call us back and Dr. Delton See can try to assist as needed, but highly encouraged from a pt safety standpoint on different types of Therapies she will need from an orthopedic standpoint, should definitely come from her Orthopedic Specialist/Surgeon. [name] [name] verbalized understanding and agrees with this plan. [name] [name] was more than gracious for all the assistance provided.

## 2020-01-17 ENCOUNTER — Encounter: Payer: Self-pay | Admitting: Infectious Disease

## 2020-01-25 ENCOUNTER — Telehealth: Payer: Self-pay

## 2020-01-25 NOTE — Telephone Encounter (Signed)
Either with me or one of my partners. She should go back to chronic suppressive antibiotics in my opinion. I dont think we were consulted when she had surgery

## 2020-01-25 NOTE — Telephone Encounter (Signed)
Thanks so much. 

## 2020-01-25 NOTE — Telephone Encounter (Signed)
Patient's husband called concerned about current antibiotic regimen. Patient was discharged in December and being treated with cefazolin 2g Q8H for a knee infection. Regimen is due to end 1/23 however patient has not been seen in our office for follow up and family was concerned about next steps if IV abx were to be completed. [facility] in [place], [place] is managing home health.   Jehan Bonano Loyola Mast, RN

## 2020-01-25 NOTE — Telephone Encounter (Signed)
Patient has been scheduled for follow up with Dr. Renold Don on Friday as an evisit due to possible weather changes.   Kohei Antonellis Loyola Mast, RN

## 2020-01-28 ENCOUNTER — Telehealth (INDEPENDENT_AMBULATORY_CARE_PROVIDER_SITE_OTHER): Payer: Medicare Other | Admitting: Internal Medicine

## 2020-01-28 ENCOUNTER — Other Ambulatory Visit: Payer: Self-pay

## 2020-01-28 ENCOUNTER — Telehealth: Payer: Self-pay

## 2020-01-28 ENCOUNTER — Telehealth: Payer: Medicare Other | Admitting: Internal Medicine

## 2020-01-28 ENCOUNTER — Encounter: Payer: Self-pay | Admitting: Internal Medicine

## 2020-01-28 DIAGNOSIS — R7881 Bacteremia: Secondary | ICD-10-CM

## 2020-01-28 DIAGNOSIS — T8450XD Infection and inflammatory reaction due to unspecified internal joint prosthesis, subsequent encounter: Secondary | ICD-10-CM | POA: Diagnosis not present

## 2020-01-28 DIAGNOSIS — T827XXD Infection and inflammatory reaction due to other cardiac and vascular devices, implants and grafts, subsequent encounter: Secondary | ICD-10-CM

## 2020-01-28 DIAGNOSIS — B9561 Methicillin susceptible Staphylococcus aureus infection as the cause of diseases classified elsewhere: Secondary | ICD-10-CM | POA: Diagnosis not present

## 2020-01-28 NOTE — Progress Notes (Addendum)
[facility]  Reason for Consult:mssa bacteremia, recurrent prosthetic knee infection, cardiac device infection Referring Provider: [facility] hospital discharge f/u    Patient Active Problem List   Diagnosis Date Noted   GI bleed 05/07/2017   Syncope 05/06/2017   Anemia 05/06/2017   Abdominal pain 05/06/2017   Orthostatic hypotension 05/06/2017   Chronic systolic CHF (congestive heart failure), NYHA class 3 (HCC) 01/17/2017   GERD (gastroesophageal reflux disease) 08/21/2016   Red blood cell antibody positive 08/19/2016   Anemia in other chronic diseases classified elsewhere 08/15/2016   Right groin pain 08/15/2016   Cardiac pacemaker in situ 08/15/2016   Painful total knee replacement, right (HCC)    ICD (implantable cardioverter-defibrillator) infection, initial encounter (HCC)    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   Arthritis 08/11/2016   S/P lumbar fusion 05/03/2016   Nonischemic cardiomyopathy (HCC) 02/08/2015   Biventricular ICD (implantable cardioverter-defibrillator) in place 02/08/2015   Chest pain 07/17/2014   Back pain 07/17/2014   Hypothyroidism 07/17/2014   Prinzmetal variant angina (HCC) 07/17/2014   Drug-induced nausea and vomiting 07/17/2014   Chronic HFrEF (heart failure with reduced ejection fraction) (HCC) 07/04/2014   Presyncope 04/11/2014   Ventricular tachycardia-nonsustained  04/11/2014   Congestive dilated cardiomyopathy (HCC) 03/31/2014   Heart palpitations 03/31/2014   Family history of premature CAD 10/30/2013   LBBB (left bundle branch block) 10/30/2013   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 The patietn expressed understanding and agreed to proceed.   Time spent: 30   Patient location: home   Provider location: office   Type of  visit (video vs phone): phone   HPI: [name] [name] is a 85 y.o. female presence of aicd --> biVICD, prosthetic knee, hx mssa infection/bacteremia involving both before mentioned prosthesis, on chronic cephalexin/rifampin, recent recurrent mssa bsi with TEE involvement of cardiac device and septic prosthetic joint infection s/p I&D, here for hospital discharge f/u   Per dr Zenaida Niece Dam's 09/2019 progress note: 05/03/16. Presented to [facility] 6/7 with fevers, weakness and diffuse pain. She developed sepsis/hypotension and was found to have MSSA bacteremia and transferred to [facility] 6/15 for continued care and assessment by EP team considering she has cardiac device. At [facility] she underwent evaluation for metastatic sites of infection including CT of spine, TTE/TEE and BCx monitoring. All repeat BCx were negative once transferred here. TTE/TEE were both negative for lead or valvular endocarditis at that time; received 4weeks IV Ancef and decision to leave cardiac device.  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 She underwent single staged exchange arthroplasty on right knee by Dr. Veda Canning. Dr. Ladona Ridgel was not available for device extraction here so she was transferred to [facility] where she had cardiac device extracted on August 10th. She had blood cultures and device cultures done with no growth. PICC inserted on 08/18/16. She was sent out on IV cefazolin 2g IV q 8 but also IV rifampin 300 mg IV q 12 at SNF and then home. Managing FIVE infusions was overwhelming for her husband. I changed her to rifampin 300mg  orally BID and then Ceftriaxone 2gram IV push to complete 42 days of therapy post extraction. Initially her cardiac function had mproved quite nicely. We placed  her on high dose keflex and bid rifampin and she has been on these meds  without fail since I last saw her. She is approaching in December 4 months of systemic antimicrobials since extraction of her device and a litle more since I and D and exchange of liner of prosthesis.   Her EF had worsened and EP and Cardiology after careful consideration we implanted a biventricular pacemaker on January 17, 2017.  She continued on her cephalexin and rifampin. -----   Recent course: 12/20/19 [facility] for [facility] shock/mssa bacteremia with recurrent septic knee. Tee showed device thrombus/vegetation. Id/cards/ortho consulted. S/p washout of the prosthetics without hardwawre removal. Bacteremia clears. I suspect due to comorbidity/age (no direct mention), hardwares are left in place. Has back pain but mri spine negative. She was discharged on 6 weeks of cefazolin with continuation of her rifampin. She had I&D on 12/15 but again no hardware removal  She has HH with [facility]   Appetite back to normal; no n/v/diarrhea/rash. Intermittent headache (chronic before all this happen)  Patient haven't been taken rifampin "not told to take it" but she does have rx for cephalexin/rifampin from previous  Her knee incision all closed up. Sutures were taken out last week by ortho who is happy with how the knee look. Patient is back to doing exercise; no pain/swelling/redness.   The cardiac device pocketsite doesn't have pain/swelling/redness. No sign of heart failure including edema/orthopnea.    She is supposed to be finishing cefazolin 01/30/20 per her report.  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 s/p Biotronik CRTD 06/2014   GERD (gastroesophageal reflux disease)    Headache    History of blood transfusion 1990s   "related to hysterectomy"   Hypothyroidism    Left bundle branch block    MSSA bacteremia 06/21/2016   Myocardial infarction (HCC)    Pancreatitis, acute     Social History   Tobacco Use   Smoking status: Never Smoker   Smokeless tobacco: Never Used   Tobacco comment: "smoked as a teenager; for maybe 1 wk"  Vaping Use   Vaping Use: Never used  Substance Use Topics   Alcohol use: Not Currently    Comment: 6/15//2018 "glass of wine q 6 months or so"   Drug use: No    Family History  Problem Relation Age of Onset   Hyperlipidemia Mother    Arrhythmia Mother        palpitations   Heart disease Father    Heart attack Father    Heart attack Maternal Grandfather    Heart attack Paternal Grandfather     Allergies  Allergen Reactions   Sulfa Antibiotics Hives    OBJECTIVE: There were no vitals filed for this visit. There is no height or weight on file to calculate BMI.   Physical Exam This is a phone visit   Lab: Reviewed [facility] admission micro/labs  [facility] labs [place] laboratory: 01/25/20 cbc 4/11/165; cr 0.7;  lft wnl  Microbiology:  Serology:  Imaging: Reviewed from [facility] records  Assessment/plan: 85 y.o. female presence of aicd --> biVICD, prosthetic knee, hx mssa infection/bacteremia involving both before mentioned prosthesis, on chronic cephalexin/rifampin, recent admission for septic shock 12/13 to [facility] in setting of recurrent mssa bsi with TEE involvement of cardiac device and septic prosthetic joint infection s/p I&D, here for hospital discharge f/u  I discussed with patient the high risk of failure of treatment given this is staph aureus and her hardwares are currently retained due to morbidities of extraction.   So far she seems to be  suppressing the infection after left prosthetic knee washout on 12/15. She hasn't been taking rifampin since admission at [facility]. I advise her to start taking that, and she can resume cephalexin 500 mg 4 times a day when cefazolin is done on 01/30/2020   -due to pk/pd, will have her take cephalexin 500 mg qid on 01/30/2020 for at least a month. She reports having no problems with this -restart rifampin 300 mg po bid -- goal at least 3 months duration from now -finish cefazolin on 01/30/20 -picc removal order will be placed by our clinic to [facility]  -f/u with Dr Daiva Eves in 4-6 weeks; I asked her if she could make that an in person appointment    I spent 30 minutes reviewing chart including OSH chart and > 50% of time discussing treatment and providing counseling for patient   Problem List Items Addressed This Visit      Musculoskeletal and Integument   Prosthetic joint infection (HCC)   Relevant Medications   ceFAZolin (ANCEF) 2-4 GM/100ML-% IVPB    Other Visit Diagnoses    MSSA bacteremia    -  Primary   Infection and inflammatory reaction due to cardiac device, implant, and graft, subsequent encounter           I am having Judah C. Piechocki maintain her melatonin, Naphazoline-Pheniramine (ALLERGY EYE OP), methylphenidate, Calcium Carbonate Antacid (TUMS PO), albuterol, feeding supplement, polyethylene glycol, methocarbamol, nitroGLYCERIN, betamethasone dipropionate, ondansetron, pantoprazole, Aspirin-Acetaminophen-Caffeine (EXCEDRIN PO), naproxen, sucralfate, carvedilol, levothyroxine, furosemide, Entresto, cephALEXin, rifampin, famotidine, Fluticasone-Salmeterol, ceFAZolin, and acetaminophen.     Follow-up: No follow-ups on file.  Raymondo Band, MD [facility] -- -- pager   [phone] cell 01/28/2020, 3:09 PM

## 2020-01-28 NOTE — Telephone Encounter (Signed)
Spoke to [employer] at [facility] to relay verbal orders per Dr. Renold Don that okay to pull PICC after last dose on 01/30/20. Orders repeated and verified.   Sandie Ano, RN

## 2020-02-01 ENCOUNTER — Other Ambulatory Visit: Payer: Self-pay | Admitting: Internal Medicine

## 2020-02-01 NOTE — Progress Notes (Signed)
opat labs   [place] [facility] 01/25/20 Cbc 4/11/165 Cr 0.7; lft wnl  Patient was finishing 6 weeks cefazolin for her mssa bacteremia.  Saw me in f/u 1/21. See note for detail

## 2020-02-02 ENCOUNTER — Telehealth: Payer: Self-pay | Admitting: Internal Medicine

## 2020-02-02 NOTE — Telephone Encounter (Signed)
Spoke with pt and advised I do not see where anyone has called pt from [facility] but if important whomever that called will call back.  Pt verbalizes understanding and thanked Charity fundraiser for call.

## 2020-02-02 NOTE — Telephone Encounter (Signed)
New message:    Patient calling stating some one called her. I did not see a message. Please advise

## 2020-02-02 NOTE — Telephone Encounter (Signed)
We did not try calling her from device clinic, however I do see that she was due to send a remote transmission yesterday that did not arrive.

## 2020-02-05 ENCOUNTER — Other Ambulatory Visit: Payer: Self-pay | Admitting: Internal Medicine

## 2020-02-11 ENCOUNTER — Encounter: Payer: Self-pay | Admitting: Cardiology

## 2020-02-11 NOTE — Telephone Encounter (Signed)
Error

## 2020-02-20 LAB — CUP PACEART REMOTE DEVICE CHECK
Battery Remaining Longevity: 84 mo
Battery Voltage: 2.98 V
Brady Statistic AP VP Percent: 3.07 %
Brady Statistic AP VS Percent: 0.09 %
Brady Statistic AS VP Percent: 92.65 %
Brady Statistic AS VS Percent: 4.2 %
Brady Statistic RA Percent Paced: 3.98 %
Brady Statistic RV Percent Paced: 4.67 %
Date Time Interrogation Session: 20220212113854
Implantable Lead Implant Date: 20190111
Implantable Lead Implant Date: 20190111
Implantable Lead Implant Date: 20190111
Implantable Lead Location: 753858
Implantable Lead Location: 753859
Implantable Lead Location: 753860
Implantable Lead Model: 5076
Implantable Lead Model: 5076
Implantable Pulse Generator Implant Date: 20190111
Lead Channel Impedance Value: 1482 Ohm
Lead Channel Impedance Value: 1501 Ohm
Lead Channel Impedance Value: 1520 Ohm
Lead Channel Impedance Value: 1596 Ohm
Lead Channel Impedance Value: 1596 Ohm
Lead Channel Impedance Value: 1653 Ohm
Lead Channel Impedance Value: 380 Ohm
Lead Channel Impedance Value: 418 Ohm
Lead Channel Impedance Value: 437 Ohm
Lead Channel Impedance Value: 532 Ohm
Lead Channel Impedance Value: 741 Ohm
Lead Channel Impedance Value: 855 Ohm
Lead Channel Impedance Value: 893 Ohm
Lead Channel Impedance Value: 893 Ohm
Lead Channel Pacing Threshold Amplitude: 0.5 V
Lead Channel Pacing Threshold Amplitude: 0.75 V
Lead Channel Pacing Threshold Amplitude: 2.125 V
Lead Channel Pacing Threshold Pulse Width: 0.4 ms
Lead Channel Pacing Threshold Pulse Width: 0.4 ms
Lead Channel Pacing Threshold Pulse Width: 1 ms
Lead Channel Sensing Intrinsic Amplitude: 1.5 mV
Lead Channel Sensing Intrinsic Amplitude: 1.5 mV
Lead Channel Sensing Intrinsic Amplitude: 12.875 mV
Lead Channel Sensing Intrinsic Amplitude: 12.875 mV
Lead Channel Setting Pacing Amplitude: 2 V
Lead Channel Setting Pacing Amplitude: 2.5 V
Lead Channel Setting Pacing Amplitude: 2.5 V
Lead Channel Setting Pacing Pulse Width: 0.4 ms
Lead Channel Setting Pacing Pulse Width: 1 ms
Lead Channel Setting Sensing Sensitivity: 0.9 mV

## 2020-02-21 ENCOUNTER — Ambulatory Visit (INDEPENDENT_AMBULATORY_CARE_PROVIDER_SITE_OTHER): Payer: Medicare Other

## 2020-02-21 DIAGNOSIS — I428 Other cardiomyopathies: Secondary | ICD-10-CM | POA: Diagnosis not present

## 2020-02-22 ENCOUNTER — Encounter: Payer: Self-pay | Admitting: Physician Assistant

## 2020-02-22 ENCOUNTER — Other Ambulatory Visit: Payer: Self-pay

## 2020-02-22 ENCOUNTER — Telehealth (INDEPENDENT_AMBULATORY_CARE_PROVIDER_SITE_OTHER): Payer: Medicare Other | Admitting: Physician Assistant

## 2020-02-22 VITALS — BP 106/63 | Ht <= 58 in | Wt 154.0 lb

## 2020-02-22 DIAGNOSIS — I10 Essential (primary) hypertension: Secondary | ICD-10-CM | POA: Diagnosis not present

## 2020-02-22 DIAGNOSIS — I428 Other cardiomyopathies: Secondary | ICD-10-CM

## 2020-02-22 DIAGNOSIS — Z95810 Presence of automatic (implantable) cardiac defibrillator: Secondary | ICD-10-CM | POA: Diagnosis not present

## 2020-02-22 NOTE — Progress Notes (Signed)
Virtual Visit via Telephone Note   This visit type was conducted due to national recommendations for restrictions regarding the COVID-19 Pandemic (e.g. social distancing) in an effort to limit this patient's exposure and mitigate transmission in our community.  Due to her co-morbid illnesses, this patient is at least at moderate risk for complications without adequate follow up.  This format is felt to be most appropriate for this patient at this time.  The patient did not have access to video technology/had technical difficulties with video requiring transitioning to audio format only (telephone).  All issues noted in this document were discussed and addressed.  No physical exam could be performed with this format.  Please refer to the patient's chart for her  consent to telehealth for [facility].    Date:  02/22/2020   ID:  [name], DOB 05-15-1935, MRN [id number] The patient was identified using 2 identifiers.  Patient Location: Home Provider Location: Home Office   PCP:  [name], MD   [facility]  Cardiologist:  Tobias Alexander, MDElectrophysiologist:  Sherryl Manges, MD :[id number]}   Evaluation Performed:  Follow-Up Visit  Chief Complaint: Hospital follow-up  History of Present Illness:    [name] [name] is a 85 y.o. female with history of nonischemic cardiomyopathy, left bundle blanch block, s/p CRT-D reimplantation, nonsustained VT, hypertension, pancreatitis seen for follow up.   She underwent CRT Biotronik implantation 06/2014. 06/2016, she was hospitalized and found to have staph bacteremia of (MSSA) in the wake of orthopedic surgery. She developed recurrent 'septic arthritis" and bacteremia and TEE confirmed Vegetation on RA lead. She was transferred to [facility] for extraction which she tolerated well. Rx with 8 weeks of IV Abx, most recently 10/18 keflex and rifampin.  Because of worsening left ventricular function and worsening  functional status, she underwent CRT reimplantation 01/2017, aware that she would need long-term chronic suppressive antibiotics.  Admitted December 2021 at [facility] for sepsis, bacteremia and right knee prosthetic joint infection S/p I & D without hardware removal.  TEE performed on December 22, 2019 showed possible infection of right atrial ICD lead versus fibrinous material, cardiology recommended maintaining current device with supportive therapy. discharged on antibiotic.  Seen virtually for follow-up.  She has completed IV antibiotic and now currently on p.o. antibiotic lifelong.  Seeing orthopedic next week.  She denies chest pain, shortness of breath, palpitation, dizziness, orthopnea, PND, syncope, lower extremity edema or melena.  The patient does not have symptoms concerning for COVID-19 infection (fever, chills, cough, or new shortness of breath).    Past Medical History:  Diagnosis Date  . 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 Acute lower GI bleeding 05/06/2017  . Acute lower GI bleeding 05/06/2017  . Anemia 05/06/2017  . Arthritis    "finger joints" (06/21/2016)  . Asthma   . CHF (congestive heart failure) (HCC)   . Congestive dilated cardiomyopathy (HCC) 03/31/2014   a. s/p Biotronik CRTD 06/2014  . GERD (gastroesophageal reflux disease)   . Headache   . History of blood transfusion 1990s   "related to hysterectomy"  . Hypothyroidism   . Left bundle branch block   . MSSA bacteremia 06/21/2016  . Myocardial infarction (HCC)   . Pancreatitis, acute    Past Surgical History:  Procedure Laterality Date  . ABDOMINAL HYSTERECTOMY    . APPENDECTOMY    . BIV  PACEMAKER INSERTION CRT-P N/A 01/17/2017   Procedure: BIV PACEMAKER INSERTION CRT-P;  Surgeon: Duke Salvia, MD;  Location: [facility] INVASIVE CV LAB;  Service: Cardiovascular;  Laterality: N/A;  . CARDIAC CATHETERIZATION    . CARDIAC  DEFIBRILLATOR PLACEMENT  ?2016  . CATARACT EXTRACTION W/ INTRAOCULAR LENS  IMPLANT, BILATERAL Bilateral   . CHOLECYSTECTOMY OPEN     "related to pancreatitis"  . DILATION AND CURETTAGE OF UTERUS    . EP IMPLANTABLE DEVICE N/A 07/04/2014   Procedure: BiV ICD Insertion CRT-D;  Surgeon: Duke Salvia, MD;  Location: [facility] INVASIVE CV LAB;  Service: Cardiovascular;  Laterality: N/A;  . ESOPHAGOGASTRODUODENOSCOPY (EGD) WITH PROPOFOL N/A 05/08/2017   Procedure: ESOPHAGOGASTRODUODENOSCOPY (EGD) WITH PROPOFOL;  Surgeon: Graylin Shiver, MD;  Location: [facility] ENDOSCOPY;  Service: Endoscopy;  Laterality: N/A;  . HERNIA REPAIR    . I & D KNEE WITH POLY EXCHANGE Right 08/11/2016   Procedure: IRRIGATION AND DEBRIDEMENT KNEE WITH POLY EXCHANGE;  Surgeon: Samson Frederic, MD;  Location: [facility] OR;  Service: Orthopedics;  Laterality: Right;  . INSERT / REPLACE / REMOVE PACEMAKER  ?2016  . JOINT REPLACEMENT Bilateral    "thumb joints; used my own material"  . JOINT REPLACEMENT    . LEFT HEART CATHETERIZATION WITH CORONARY ANGIOGRAM N/A 11/02/2013   Procedure: LEFT HEART CATHETERIZATION WITH CORONARY ANGIOGRAM;  Surgeon: Corky Crafts, MD;  Location: [facility] CATH LAB;  Service: Cardiovascular;  Laterality: N/A;  . LOOP RECORDER IMPLANT N/A 04/13/2014   Procedure: LOOP RECORDER IMPLANT;  Surgeon: Duke Salvia, MD;  Location: [facility] CATH LAB;  Service: Cardiovascular;  Laterality: N/A;  . LOOP RECORDER REMOVAL  ?2016  . POSTERIOR LUMBAR FUSION  05/03/2016   Lumbar Two-Three, Lumbar Three-Four Posterior Lumbar Fusion withLaminectomy and Foraminotomy  . SPINE SURGERY  04/2016   lumbar   . TEE WITHOUT CARDIOVERSION N/A 08/14/2016   Procedure: TRANSESOPHAGEAL ECHOCARDIOGRAM (TEE);  Surgeon: Lars Masson, MD;  Location: [facility] ENDOSCOPY;  Service: Cardiovascular;  Laterality: N/A;  . TONSILLECTOMY    . TOTAL HIP ARTHROPLASTY Bilateral   . UMBILICAL HERNIA REPAIR  1970s?     Current Meds  Medication Sig  . acetaminophen  (TYLENOL) 325 MG tablet Take 650 mg by mouth every 6 (six) hours as needed.  [name] albuterol (VENTOLIN HFA) 108 (90 Base) MCG/ACT inhaler Inhale 2 puffs into the lungs every 6 (six) hours as needed for wheezing or shortness of breath.  . Aspirin-Acetaminophen-Caffeine (EXCEDRIN PO) Take 1 tablet by mouth as needed.  . betamethasone dipropionate (DIPROLENE) 0.05 % ointment Apply 1 application topically daily as needed.  . Calcium Carbonate Antacid (TUMS PO) Take 1 tablet by mouth daily as needed (indigestion).   . carvedilol (COREG) 12.5 MG tablet Take 1 tablet (12.5 mg total) by mouth 2 (two) times daily.  . cephALEXin (KEFLEX) 500 MG capsule Take 2 capsules (1,000 mg total) by mouth 2 (two) times daily.  [name] ENTRESTO 24-26 MG TAKE 1 TABLET TWICE A DAY  . famotidine (PEPCID) 20 MG tablet Take 20 mg by mouth 2 (two) times daily as needed.  . feeding supplement, ENSURE ENLIVE, (ENSURE ENLIVE) LIQD Take 237 mLs by mouth 2 (two) times daily between meals.  . Fluticasone-Salmeterol (ADVAIR) 250-50 MCG/DOSE AEPB Inhale 1 puff into the lungs 2 (two) times daily.  . furosemide (LASIX) 40 MG tablet TAKE 1 TABLET DAILY (DOSE INCREASED)  . levothyroxine (SYNTHROID) 175 MCG tablet Take 1 tablet (175 mcg total) by mouth daily before breakfast.  . Melatonin 3  MG TABS Take 3 mg by mouth at bedtime as needed (for sleep.).   [name] methocarbamol (ROBAXIN) 500 MG tablet Take 1 tablet (500 mg total) by mouth every 6 (six) hours as needed for muscle spasms.  . methylphenidate (RITALIN) 20 MG tablet Take 40 mg by mouth daily.  March Rummage (ALLERGY EYE OP) Place 1-2 drops into both eyes daily as needed (for irritated eyes).  . naproxen (NAPROSYN) 500 MG tablet Take 500 mg by mouth 2 (two) times daily as needed.  . ondansetron (ZOFRAN) 8 MG tablet Take 8 mg by mouth as needed for nausea or vomiting.  . pantoprazole (PROTONIX) 40 MG tablet Take 40 mg by mouth daily.  . polyethylene glycol (MIRALAX / GLYCOLAX) packet  Take 17 g by mouth daily as needed for mild constipation.  . rifampin (RIFADIN) 300 MG capsule TAKE 1 CAPSULE BY MOUTH EVERY 12 HOURS  . sucralfate (CARAFATE) 1 g tablet Take 1 g by mouth daily.     Allergies:   Sulfa antibiotics   Social History   Tobacco Use  . Smoking status: Never Smoker  . Smokeless tobacco: Never Used  . Tobacco comment: "smoked as a teenager; for maybe 1 wk"  Vaping Use  . Vaping Use: Never used  Substance Use Topics  . Alcohol use: Not Currently    Comment: 6/15//2018 "glass of wine q 6 months or so"  . Drug use: No     Family Hx: The patient's family history includes Arrhythmia in her mother; Heart attack in her father, maternal grandfather, and paternal grandfather; Heart disease in her father; Hyperlipidemia in her mother.  ROS:   Please see the history of present illness.    All other systems reviewed and are negative.   Prior CV studies:   The following studies were reviewed today:  TEE 12/2019 Summary 1. Mobile, linear echos associated with device lead, cannot exclude infectious vegetation (see detail below). 2. No apparent valvular vegetation visualized. 3. There is mild mitral valve regurgitation. 4. Aortic sclerosis. 5. There is mild aortic regurgitation. Anesthesia/Analgesia The patient received moderate sedation. The patient was premedicated with 4.00 mg intravenous Versed. The patient was premedicated with 125.00 mcg intravenous Fentanyl. The patient was administered viscous Xylocaine 2% 82ml gargled and swallowed. I personally spent 30 minutes continuously monitoring the patient during the administration of moderate sedation. Procedure Details The patient tolerated the procedure well and there were no complications. There was moderate difficulty inserting the probe. Number of insertion attempts: 3. The probe was inserted by the cardiologist. The patient arrived in a fasting state after obtaining informed consent. The transesophageal probe was  passed into the posterior pharynx, mid-esophagus, distal esophagus, and gastric fundus. Imaging was performed at multiple levels. The patient tolerated the procedure well and there were no complications. The patient was transferred out of the examination area in satisfactory condition. I was present and supervised the entire procedure, including insertion of the transesophageal probe. The patient was transferred to the floor in satisfactory condition. Right Ventricle Additional right ventricle findings: there is a device lead present. Right Atrium Highly mobile, linear echos are noted attached to the device lead in the right atrium (likely atrial lead, though cannot exclude ventricular lead as well; the echodensities are noted where the leads overlap). This could be consistent with infectious vegetation in the right clinical setting, though fibrinous material is in the differential as well. May consider correlation with previous TEE, if available. Aortic Valve The aortic valve is trileaflet with mildly  thickened leaflets with moderately reduced excursion. There is mild aortic regurgitation. No aortic valve vegetation visualized. Pulmonic Valve The pulmonic valve is poorly visualized, but probably normal. There is trivial pulmonic regurgitation. Mitral Valve The mitral valve leaflets are normal with normal leaflet mobility. No mitral valve vegetation visualized. There is mild mitral valve regurgitation. Tricuspid Valve The tricuspid valve leaflets are normal, with normal leaflet mobility. There is mild tricuspid regurgitation. No tricuspid valve vegetation visualized. Report Signatures Finalized by Vevelyn Francois MD on 12/22/2019 06:46 PM Resident Joycelyn Man MD on 12/22/2019 04:33 PM    Labs/Other Tests and Data Reviewed:    EKG:  No ECG reviewed.  Recent Labs: 05/20/2019: ALT 11; BUN 28; Creatinine, Ser 1.26; NT-Pro BNP 110; Potassium 4.7; Sodium 139 07/05/2019: TSH 30.79   Recent Lipid Panel Lab  Results  Component Value Date/Time   CHOL 171 05/06/2017 03:51 PM   TRIG 127 05/06/2017 03:51 PM   HDL 43 05/06/2017 03:51 PM   CHOLHDL 4.0 05/06/2017 03:51 PM   LDLCALC 103 (H) 05/06/2017 03:51 PM    Wt Readings from Last 3 Encounters:  02/22/20 154 lb (69.9 kg)  11/04/19 154 lb (69.9 kg)  07/05/19 169 lb 9.6 oz (76.9 kg)     Risk Assessment/Calculations:      Objective:    Vital Signs:  BP 106/63   Ht 4\' 10"  (1.473 m)   Wt 154 lb (69.9 kg)   LMP  (LMP Unknown)   BMI 32.19 kg/m    VITAL SIGNS:  reviewed GEN:  no acute distress PSYCH:  normal affect  ASSESSMENT & PLAN:    1. S/p CRT-D reimplantation -  TEE performed on December 22, 2019 showed possible infection of right atrial ICD lead versus fibrinous material, cardiology at [facility] recommended maintaining current device with supportive therapy. -Will be on lifelong p.o. antibiotic  2.  Nonischemic cardiomyopathy - Euvolemic appearing during for conversation -Continue Coreg, Entresto and Lasix  3.  Hypertension -Blood pressure stable and well-controlled on current medication  4.  Septic knee s/p I&D -Recovering well        COVID-19 Education: The signs and symptoms of COVID-19 were discussed with the patient and how to seek care for testing (follow up with PCP or arrange E-visit).  The importance of social distancing was discussed today.  Time:   Today, I have spent 14 minutes with the patient with telehealth technology discussing the above problems.     Medication Adjustments/Labs and Tests Ordered: Current medicines are reviewed at length with the patient today.  Concerns regarding medicines are outlined above.   Tests Ordered: No orders of the defined types were placed in this encounter.   Medication Changes: No orders of the defined types were placed in this encounter.   Follow Up:  In Person in 6 month(s)  Signed, [facility], Manson Passey  02/22/2020 9:23 AM    [facility]

## 2020-02-22 NOTE — Patient Instructions (Signed)
Medication Instructions:  Your physician recommends that you continue on your current medications as directed. Please refer to the Current Medication list given to you today.  *If you need a refill on your cardiac medications before your next appointment, please call your pharmacy*   Lab Work: None ordered  If you have labs (blood work) drawn today and your tests are completely normal, you will receive your results only by: . MyChart Message (if you have MyChart) OR . A paper copy in the mail If you have any lab test that is abnormal or we need to change your treatment, we will call you to review the results.   Testing/Procedures: None ordered   Follow-Up: At [facility], you and your health needs are our priority.  As part of our continuing mission to provide you with exceptional heart care, we have created designated Provider Care Teams.  These Care Teams include your primary Cardiologist (physician) and Advanced Practice Providers (APPs -  Physician Assistants and Nurse Practitioners) who all work together to provide you with the care you need, when you need it.  We recommend signing up for the patient portal called "MyChart".  Sign up information is provided on this After Visit Summary.  MyChart is used to connect with patients for Virtual Visits (Telemedicine).  Patients are able to view lab/test results, encounter notes, upcoming appointments, etc.  Non-urgent messages can be sent to your provider as well.   To learn more about what you can do with MyChart, go to [email or web address].    Your next appointment:   6 month(s)  The format for your next appointment:   In Person  Provider:   You may see Katarina Nelson, MD or one of the following Advanced Practice Providers on your designated Care Team:    Scott Weaver, PA-C  Vin Bhagat, PA-C    Other Instructions   

## 2020-02-28 NOTE — Progress Notes (Signed)
Remote pacemaker transmission.   

## 2020-05-22 ENCOUNTER — Ambulatory Visit (INDEPENDENT_AMBULATORY_CARE_PROVIDER_SITE_OTHER): Payer: Medicare Other

## 2020-05-22 DIAGNOSIS — I428 Other cardiomyopathies: Secondary | ICD-10-CM

## 2020-05-24 ENCOUNTER — Telehealth: Payer: Self-pay | Admitting: Physician Assistant

## 2020-05-24 LAB — CUP PACEART REMOTE DEVICE CHECK
Battery Remaining Longevity: 77 mo
Battery Voltage: 2.97 V
Brady Statistic AP VP Percent: 11.79 %
Brady Statistic AP VS Percent: 0.19 %
Brady Statistic AS VP Percent: 83.21 %
Brady Statistic AS VS Percent: 4.81 %
Brady Statistic RA Percent Paced: 13.06 %
Brady Statistic RV Percent Paced: 16.34 %
Date Time Interrogation Session: 20220517143206
Implantable Lead Implant Date: 20190111
Implantable Lead Implant Date: 20190111
Implantable Lead Implant Date: 20190111
Implantable Lead Location: 753858
Implantable Lead Location: 753859
Implantable Lead Location: 753860
Implantable Lead Model: 5076
Implantable Lead Model: 5076
Implantable Pulse Generator Implant Date: 20190111
Lead Channel Impedance Value: 1368 Ohm
Lead Channel Impedance Value: 1387 Ohm
Lead Channel Impedance Value: 1425 Ohm
Lead Channel Impedance Value: 1482 Ohm
Lead Channel Impedance Value: 1501 Ohm
Lead Channel Impedance Value: 1539 Ohm
Lead Channel Impedance Value: 361 Ohm
Lead Channel Impedance Value: 361 Ohm
Lead Channel Impedance Value: 418 Ohm
Lead Channel Impedance Value: 456 Ohm
Lead Channel Impedance Value: 703 Ohm
Lead Channel Impedance Value: 779 Ohm
Lead Channel Impedance Value: 798 Ohm
Lead Channel Impedance Value: 836 Ohm
Lead Channel Pacing Threshold Amplitude: 0.5 V
Lead Channel Pacing Threshold Amplitude: 0.875 V
Lead Channel Pacing Threshold Amplitude: 2.125 V
Lead Channel Pacing Threshold Pulse Width: 0.4 ms
Lead Channel Pacing Threshold Pulse Width: 0.4 ms
Lead Channel Pacing Threshold Pulse Width: 1 ms
Lead Channel Sensing Intrinsic Amplitude: 1.375 mV
Lead Channel Sensing Intrinsic Amplitude: 1.375 mV
Lead Channel Sensing Intrinsic Amplitude: 13.125 mV
Lead Channel Sensing Intrinsic Amplitude: 13.125 mV
Lead Channel Setting Pacing Amplitude: 2 V
Lead Channel Setting Pacing Amplitude: 2.5 V
Lead Channel Setting Pacing Amplitude: 2.5 V
Lead Channel Setting Pacing Pulse Width: 0.4 ms
Lead Channel Setting Pacing Pulse Width: 1 ms
Lead Channel Setting Sensing Sensitivity: 0.9 mV

## 2020-05-24 NOTE — Telephone Encounter (Signed)
Added a note to patient's appointment and also her husbands, prefer virtual/phone visit.  Both are scheduled the same day.

## 2020-05-24 NOTE — Telephone Encounter (Signed)
Returned call to patient and she states herself and her husband have been in the hospital several times and would like to make their visit virtual.  It is hard to get someone to drive them.  She said if they absolutely have to come in they will try to find someone to bring them.  I would forward this message and call back with advisement.

## 2020-05-24 NOTE — Telephone Encounter (Signed)
[name] [name] is calling for her and her husband. They are both scheduled on 06/28/20 for hospital f/u's and are wanting it to be virtual. If she does not answer when calling back please leave a message. Please advise.

## 2020-05-24 NOTE — Telephone Encounter (Addendum)
Unfortunately this is somewhat challenging as I do not see any hospital records recently to determine what the hospital follow-up is for. If she is asymptomatic and able to check her vitals at home, would be reasonable to try virtual and we can use that visit to determine if in-person visit needed. If any symptoms or abnormal vital signs, best to see in person. Thank you!

## 2020-05-24 NOTE — Telephone Encounter (Signed)
Patient is asymptomatic and is able to obtain vitals at home.  Patient advised to take several days of vitals and log them.  I can  change visits to virtual and advised to call back at first of June with results of vitals and to follow up about visit.

## 2020-06-07 ENCOUNTER — Other Ambulatory Visit: Payer: Self-pay

## 2020-06-07 DIAGNOSIS — I5022 Chronic systolic (congestive) heart failure: Secondary | ICD-10-CM

## 2020-06-07 DIAGNOSIS — Z95810 Presence of automatic (implantable) cardiac defibrillator: Secondary | ICD-10-CM

## 2020-06-07 MED ORDER — ENTRESTO 24-26 MG PO TABS: 1.0000 | ORAL_TABLET | Freq: Two times a day (BID) | ORAL | 2 refills | Status: DC

## 2020-06-14 NOTE — Progress Notes (Signed)
Remote pacemaker transmission.   

## 2020-06-23 ENCOUNTER — Telehealth: Payer: Self-pay | Admitting: Physician Assistant

## 2020-06-23 NOTE — Telephone Encounter (Signed)
Patient requesting her and her husbands appointments 6/22 be virtual, because they both are not feeling well. She states they both have been in the hospital.

## 2020-06-23 NOTE — Telephone Encounter (Signed)
Pt has been made aware it is ok to do a virtual visit 06/28/2020.  She is aware to have weight and vitals ready for the visit and someone will call 15 mins prior to the appt time.

## 2020-06-23 NOTE — Telephone Encounter (Signed)
See 5/18 phone note for recent answer, thx!

## 2020-06-27 ENCOUNTER — Encounter: Payer: Self-pay | Admitting: Physician Assistant

## 2020-06-27 NOTE — Progress Notes (Addendum)
Virtual Visit via Telephone Note   This visit type was conducted due to national recommendations for restrictions regarding the COVID-19 Pandemic (e.g. social distancing) in an effort to limit this patient's exposure and mitigate transmission in our community.  Due to her co-morbid illnesses, this patient is at least at moderate risk for complications without adequate follow up.  This format is felt to be most appropriate for this patient at this time.  The patient did not have access to video technology/had technical difficulties with video requiring transitioning to audio format only (telephone).  All issues noted in this document were discussed and addressed.  No physical exam could be performed with this format.  Please refer to the patient's chart for her  consent to telehealth for [facility].   The patient was identified using 2 identifiers.  Date:  06/28/2020   ID:  [name], DOB [birth year], MRN [id number]  Patient Location: Home Provider Location: Office/Clinic  PCP:  Leonard Downing, MD  Cardiologist:  Ena Dawley, MD (Inactive) - needs to establish Electrophysiologist:  Virl Axe, MD   Evaluation Performed:  Follow-Up Visit  Chief Complaint:  f/u NICM - patient requested virtual visit only  History of Present Illness:    [name] [name] is a 85 y.o. female with NICM, chronic combined CHF, LBBB, nonsustained VT, prior CRT-D insertion c/b vegetations with subsequent extraction and implantation of CRT-P in 01/2017, HTN, pancreatitis, prior GIB, asthma, anemia, GERD, hypothyroidism, prior issues with bacteremia seen for virtual follow-up. She did not wish to come into the office for follow-up.  Cardiac cath 2015 showed normal coronaries. Per chart review she previously underwent BiV ICD insertion in 2016. She had staph bacteremia in 2018 in the wake of orthopedic surgery and TEE confirmed vegetation on RA lead. She was transferred to [facility] for extraction.  Because of interval subsequent worsening LV function and functional status, she underwent CRT reimplantation 01/2017 and it was felt she would require long term abx. Per operative report it appears this was a CRT-P on this insertion, not CRT-D. This is followed by Dr. Caryl Comes.  She was again admitted 12/2019 at [facility] for sepsis, bacteremia and right knee prosthetic joint infection s/p I&D. She unfortunately had run out of her antibiotics for about 2 weeks prior to this due to an issue with her mail order pharmacy. Repeat 2D echo showed EF >55% (previously 30-35% by our limited echo 02/2019), moderate MAC, mild MR, normal RV, mild pulm HTN. TEE reportedly showed possible infection of right atrial ICD lead versus fibrinous material. Per notes, cardiology recommended maintaining current device with supportive therapy and she was discharged on antibiotics. She has since followed with ID as well. Prior notes by [place] indicate she was previously taken off of spironolactone as she could not tolerate, ? due to CKD stage IV although last Cr normal in 01/2020 by labs available to me.  She is seen back virtually at her request as it is challenging for them to get out of the house to appointments. Her husband has required a lot of care since his last hospitalizations Renetta Chalk). She reports she is overall doing well without any new chest pain, SOB, palpitations, syncope, edema. Last optivol 05/2020 showed some possible fluid accumulation but the patient denies this clinically. Her weight is down from 150s to 138 at this time, she reports unintentionally in the context of stress of her husband's illness. She states it has leveled off and she is no longer losing the  last few weeks. Overall she is pleased with how she is feeling. They have 5 daughters who look out for them and assist with care.  Labs Independently Reviewed Scanned lab 01/2020 K 4.2, Cr 0.80, LFTs ok, Hgb 10.7 (consistent with prior) 12/2019 TSH up, Ft4 wnl  Past  Medical History:  Diagnosis Date   Acrodermatitis continua of [place]    "was in remission for 5 years the 1st time; ~ 7 years the second time" (06/21/2016)   Acute lower GI bleeding 05/06/2017   Anemia 05/06/2017   Arthritis    "finger joints" (06/21/2016)   Asthma    Chronic combined systolic (congestive) and diastolic (congestive) heart failure (HCC)    Congestive dilated cardiomyopathy ([place]) 03/31/2014   a. 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[place])    Left bundle branch block    MSSA bacteremia 06/21/2016   Myocardial infarction ([place])    NICM (nonischemic cardiomyopathy) ([place])    Pancreatitis, acute    Past Surgical History:  Procedure Laterality Date   ABDOMINAL HYSTERECTOMY     APPENDECTOMY     BIV PACEMAKER INSERTION CRT-P N/A 01/17/2017   Procedure: BIV PACEMAKER INSERTION CRT-P;  Surgeon: Deboraha Sprang, MD;  Location: [place] CV LAB;  Service: Cardiovascular;  Laterality: N/A;   CARDIAC CATHETERIZATION     CARDIAC DEFIBRILLATOR PLACEMENT  ?2016   CATARACT EXTRACTION W/ INTRAOCULAR LENS  IMPLANT, BILATERAL Bilateral    CHOLECYSTECTOMY OPEN     "related to pancreatitis"   DILATION AND CURETTAGE OF UTERUS     EP IMPLANTABLE DEVICE N/A 07/04/2014   Procedure: BiV ICD Insertion CRT-D;  Surgeon: Deboraha Sprang, MD;  Location: [place] CV LAB;  Service: Cardiovascular;  Laterality: N/A;   ESOPHAGOGASTRODUODENOSCOPY (EGD) WITH PROPOFOL N/A 05/08/2017   Procedure: ESOPHAGOGASTRODUODENOSCOPY (EGD) WITH PROPOFOL;  Surgeon: Wonda Horner, MD;  Location: [facility] ENDOSCOPY;  Service: Endoscopy;  Laterality: N/A;   HERNIA REPAIR     I & D KNEE WITH POLY EXCHANGE Right 08/11/2016   Procedure: IRRIGATION AND DEBRIDEMENT KNEE WITH POLY EXCHANGE;  Surgeon: Rod Can, MD;  Location: [place];  Service: Orthopedics;   Laterality: Right;   INSERT / REPLACE / REMOVE PACEMAKER  ?2016   JOINT REPLACEMENT Bilateral    "thumb joints; used my own material"   JOINT REPLACEMENT     LEFT HEART CATHETERIZATION WITH CORONARY ANGIOGRAM N/A 11/02/2013   Procedure: LEFT HEART CATHETERIZATION WITH CORONARY ANGIOGRAM;  Surgeon: Jettie Booze, MD;  Location: [facility] CATH LAB;  Service: Cardiovascular;  Laterality: N/A;   LOOP RECORDER IMPLANT N/A 04/13/2014   Procedure: LOOP RECORDER IMPLANT;  Surgeon: Deboraha Sprang, MD;  Location: [facility] CATH LAB;  Service: Cardiovascular;  Laterality: N/A;   LOOP RECORDER REMOVAL  ?2016   POSTERIOR LUMBAR FUSION  05/03/2016   Lumbar Two-Three, Lumbar Three-Four Posterior Lumbar Fusion withLaminectomy and Foraminotomy   SPINE SURGERY  04/2016   lumbar    TEE WITHOUT CARDIOVERSION N/A 08/14/2016   Procedure: TRANSESOPHAGEAL ECHOCARDIOGRAM (TEE);  Surgeon: Dorothy Spark, MD;  Location: [facility] ENDOSCOPY;  Service: Cardiovascular;  Laterality: N/A;   TONSILLECTOMY     TOTAL HIP ARTHROPLASTY Bilateral    UMBILICAL HERNIA REPAIR  1970s?     Current Meds  Medication Sig   acetaminophen (TYLENOL) 325 MG tablet Take 650  mg by mouth every 6 (six) hours as needed.   albuterol (VENTOLIN HFA) 108 (90 Base) MCG/ACT inhaler Inhale 2 puffs into the lungs every 6 (six) hours as needed for wheezing or shortness of breath.   Aspirin-Acetaminophen-Caffeine (EXCEDRIN PO) Take 1 tablet by mouth as needed.   betamethasone dipropionate (DIPROLENE) 0.05 % ointment Apply 1 application topically daily as needed.   Calcium Carbonate Antacid (TUMS PO) Take 1 tablet by mouth daily as needed (indigestion).    carvedilol (COREG) 12.5 MG tablet Take 1 tablet (12.5 mg total) by mouth 2 (two) times daily.   cephALEXin (KEFLEX) 500 MG capsule Take 2 capsules (1,000 mg total) by mouth 2 (two) times daily.   famotidine (PEPCID) 20 MG tablet Take 20 mg by mouth 2 (two) times daily as needed.   feeding supplement, ENSURE  ENLIVE, (ENSURE ENLIVE) LIQD Take 237 mLs by mouth 2 (two) times daily between meals.   Fluticasone-Salmeterol (ADVAIR) 250-50 MCG/DOSE AEPB Inhale 1 puff into the lungs 2 (two) times daily.   furosemide (LASIX) 40 MG tablet TAKE 1 TABLET DAILY (DOSE INCREASED)   levothyroxine (SYNTHROID) 175 MCG tablet Take 1 tablet (175 mcg total) by mouth daily before breakfast.   Melatonin 3 MG TABS Take 3 mg by mouth at bedtime as needed (for sleep.).    methocarbamol (ROBAXIN) 500 MG tablet Take 1 tablet (500 mg total) by mouth every 6 (six) hours as needed for muscle spasms.   methylphenidate (RITALIN) 20 MG tablet Take 40 mg by mouth daily.   Naphazoline-Pheniramine (ALLERGY EYE OP) Place 1-2 drops into both eyes daily as needed (for irritated eyes).   naproxen (NAPROSYN) 500 MG tablet Take 500 mg by mouth 2 (two) times daily as needed.   nitroGLYCERIN (NITROSTAT) 0.4 MG SL tablet Place 1 tablet (0.4 mg total) every 5 (five) minutes as needed under the tongue for chest pain.   ondansetron (ZOFRAN) 8 MG tablet Take 8 mg by mouth as needed for nausea or vomiting.   pantoprazole (PROTONIX) 40 MG tablet Take 40 mg by mouth daily.   polyethylene glycol (MIRALAX / GLYCOLAX) packet Take 17 g by mouth daily as needed for mild constipation.   rifampin (RIFADIN) 300 MG capsule TAKE 1 CAPSULE BY MOUTH EVERY 12 HOURS   sacubitril-valsartan (ENTRESTO) 24-26 MG Take 1 tablet by mouth 2 (two) times daily.   sucralfate (CARAFATE) 1 g tablet Take 1 g by mouth daily.     Allergies:   Sulfa antibiotics   Social History   Tobacco Use   Smoking status: Never   Smokeless tobacco: Never   Tobacco comments:    "smoked as a teenager; for maybe 1 wk"  Vaping Use   Vaping Use: Never used  Substance Use Topics   Alcohol use: Not Currently    Comment: 6/15//2018 "glass of wine q 6 months or so"   Drug use: No     Family Hx: The patient's family history includes Arrhythmia in her mother; Heart attack in her father,  maternal grandfather, and paternal grandfather; Heart disease in her father; Hyperlipidemia in her mother.  ROS:   Please see the history of present illness.    All other systems reviewed and are negative.   Prior CV studies:   The following studies were reviewed today:  2D echo CareEverywhere 12/2019  Summary    1. The left ventricle is normal in size with normal wall thickness.    2. The left ventricular systolic function is normal, LVEF is visually  estimated at > 55%.    3. The mitral valve leaflets are mildly thickened with normal leaflet  mobility.    4. Mitral annular calcification is present (moderate).    5. There is mild mitral valve regurgitation.    6. The aortic valve is probably trileaflet with mildly thickened leaflets  with normal excursion.    7. The right ventricle is normal in size, with normal systolic function.    8. There is mild pulmonary hypertension, estimated pulmonary artery systolic  pressure is 39 mmHg.    9. IVC size and inspiratory change suggest mildly elevated right atrial  pressure. (5-10 mmHg).   TEE [facility]Everywhere 12/2019 Summary    1. Mobile, linear echos associated with device lead, cannot exclude  infectious vegetation (see detail below).    2. No apparent valvular vegetation visualized.    3. There is mild mitral valve regurgitation.    4. Aortic sclerosis.    5. There is mild aortic regurgitation.   2D echo limited 02/2019   1. Moderate to severe global reduction in LV systolic function; mild LVH;  grade 1 diastolic dysfunction; mild AI.   2. Left ventricular ejection fraction, by estimation, is 30 to 35%. The  left ventricle has moderate to severely decreased function. The left  ventrical demonstrates global hypokinesis. There is mildly increased left  ventricular hypertrophy. Left  ventricular diastolic parameters are consistent with Grade I diastolic  dysfunction (impaired relaxation).   3. Right ventricular systolic function  is normal. The right ventricular  size is normal.   4. The mitral valve is normal in structure and function. trivial mitral  valve regurgitation. No evidence of mitral stenosis.   5. The aortic valve is tricuspid. Aortic valve regurgitation is mild.  Mild to moderate aortic valve sclerosis/calcification is present, without  any evidence of aortic stenosis.   NST 07/2017 Nuclear stress EF: 50%. This is a low risk study. The left ventricular ejection fraction is mildly decreased (45-54%).   Thinning of the inferior wall ? From extra cardiac activity No ischemia EF 50% with inferior apical wall hypokinesis  Last remote 05/2020 Scheduled remote reviewed. 2 NSVT events 1-2 seconds appear atrial driven. Possible OptiVol fluid accumulation: 19-May-2020 -- ongoing. Normal devicefunction. RP     Labs/Other Tests and Data Reviewed:    EKG:  An ECG dated 05/20/19 was personally reviewed today and demonstrated:  atrial sensed, V paced rhythm with 68bpm   Recent Labs: 07/05/2019: TSH 30.79   Recent Lipid Panel Lab Results  Component Value Date/Time   CHOL 171 05/06/2017 03:51 PM   TRIG 127 05/06/2017 03:51 PM   HDL 43 05/06/2017 03:51 PM   CHOLHDL 4.0 05/06/2017 03:51 PM   LDLCALC 103 (H) 05/06/2017 03:51 PM    Wt Readings from Last 3 Encounters:  06/28/20 138 lb (62.6 kg)  02/22/20 154 lb (69.9 kg)  11/04/19 154 lb (69.9 kg)     Objective:    Vital Signs:  BP 116/67   Pulse 67   Ht _0  (1.575 m)   Wt 138 lb (62.6 kg)   LMP  (LMP Unknown)   BMI 25.24 kg/m    VS reviewed. General - delightful calm female in no acute distress Pulm - No labored breathing, no coughing during visit, no audible wheezing, speaking in full sentences Neuro - A+Ox3, no slurred speech, answers questions appropriately Psych - Pleasant affect  ASSESSMENT & PLAN:    Chronic combined CHF/NICM - virtual platform precludes physical examination but  the patient clinically feels she is doing well.  Echocardiogram in 12/2019 thankfully showed normalization of her LV function back to >55%. Will continue Entresto, carvedilol and Lasix at present doses. Hold off on spironolactone due to prior intolerance. Reviewed HF symptoms, 2g sodium restriction, 2L fluid restriction, daily weights with patient.  LBBB - chronic for patient, has CRT-P in place per notes. Continue device surveillance by EP.  History of pacemaker with prior bacteremia/vegetation - last admitted 12/2019 for recurrent infection in the context of being out of her antibiotics due to a pharmacy mail order delay. She is back on her antibiotics and feeling great. She has not seen Dr. Meda Coffee or Dr. Caryl Comes since this admission. She saw Vin Bhagat in 02/2020 virtually and was felt to be doing well. Per CareEverywhere, the cardiology team @ [facility] did not feel any further procedures were necessary. I will reach out to Dr. Caryl Comes to confirm he is aware of this finding and advise on any necessary follow-up in the interim. She is also due for EP follow-up which we will request. Addendum: Dr. Caryl Comes aware, no specific additional recs at this time.  Anemia - encouraged to follow up with primary care for this finding. Hgb appears chronically in the 10 range recently. She is not on any blood thinner therapy at this time.  5.  H/o NSVT - asymptomatic. No recent sustained events on device check. Continue BB.  Time:   Today, I have spent 12 minutes with the patient with telehealth technology discussing the above problems.     Medication Adjustments/Labs and Tests Ordered: Current medicines are reviewed at length with the patient today.  Testing and concerns regarding medicines are outlined above.    Follow Up: Would benefit from in-person follow-up but this may be challenging for the patient. She is due for EP follow-up. Will defer to Dr. Caryl Comes if he is OK with virtual follow-up. Otherwise will arrange a 6 month appointment with Dr. Johney Frame to establish  care.  Signed, Charlie Pitter, PA-C  06/28/2020 12:13 PM    [facility]

## 2020-06-28 ENCOUNTER — Telehealth (INDEPENDENT_AMBULATORY_CARE_PROVIDER_SITE_OTHER): Payer: Medicare Other | Admitting: Physician Assistant

## 2020-06-28 ENCOUNTER — Encounter: Payer: Self-pay | Admitting: Physician Assistant

## 2020-06-28 ENCOUNTER — Other Ambulatory Visit: Payer: Self-pay

## 2020-06-28 VITALS — BP 116/67 | HR 67 | Ht 62.0 in | Wt 138.0 lb

## 2020-06-28 DIAGNOSIS — I447 Left bundle-branch block, unspecified: Secondary | ICD-10-CM | POA: Diagnosis not present

## 2020-06-28 DIAGNOSIS — I4729 Other ventricular tachycardia: Secondary | ICD-10-CM

## 2020-06-28 DIAGNOSIS — I428 Other cardiomyopathies: Secondary | ICD-10-CM | POA: Diagnosis not present

## 2020-06-28 DIAGNOSIS — Z950 Presence of cardiac pacemaker: Secondary | ICD-10-CM | POA: Diagnosis not present

## 2020-06-28 DIAGNOSIS — I5042 Chronic combined systolic (congestive) and diastolic (congestive) heart failure: Secondary | ICD-10-CM | POA: Diagnosis not present

## 2020-06-28 DIAGNOSIS — D649 Anemia, unspecified: Secondary | ICD-10-CM

## 2020-06-28 DIAGNOSIS — I472 Ventricular tachycardia: Secondary | ICD-10-CM

## 2020-06-28 NOTE — Patient Instructions (Signed)
Medication Instructions:  Your physician recommends that you continue on your current medications as directed. Please refer to the Current Medication list given to you today.  *If you need a refill on your cardiac medications before your next appointment, please call your pharmacy*   Lab Work: None ordered  If you have labs (blood work) drawn today and your tests are completely normal, you will receive your results only by: MyChart Message (if you have MyChart) OR A paper copy in the mail If you have any lab test that is abnormal or we need to change your treatment, we will call you to review the results.   Testing/Procedures: None ordered   Follow-Up: At [facility], you and your health needs are our priority.  As part of our continuing mission to provide you with exceptional heart care, we have created designated Provider Care Teams.  These Care Teams include your primary Cardiologist (physician) and Advanced Practice Providers (APPs -  Physician Assistants and Nurse Practitioners) who all work together to provide you with the care you need, when you need it.  We recommend signing up for the patient portal called "MyChart".  Sign up information is provided on this After Visit Summary.  MyChart is used to connect with patients for Virtual Visits (Telemedicine).  Patients are able to view lab/test results, encounter notes, upcoming appointments, etc.  Non-urgent messages can be sent to your provider as well.   To learn more about what you can do with MyChart, go to [email or web address].    Your next appointment:   6 months with Dr. Shari Prows  (You can call the office to schedule this appointment)  Next available with Dr. Graciela Husbands  (Someone will call you with this appointment)  The format for your next appointment:   In Person  Provider:   Laurance Flatten, MD   Other Instructions

## 2020-07-07 ENCOUNTER — Encounter: Payer: Medicare Other | Admitting: Internal Medicine

## 2020-07-11 ENCOUNTER — Telehealth: Payer: Medicare Other | Admitting: Infectious Disease

## 2020-07-14 ENCOUNTER — Telehealth (INDEPENDENT_AMBULATORY_CARE_PROVIDER_SITE_OTHER): Payer: Medicare Other | Admitting: Infectious Disease

## 2020-07-14 ENCOUNTER — Other Ambulatory Visit: Payer: Self-pay

## 2020-07-14 DIAGNOSIS — I5022 Chronic systolic (congestive) heart failure: Secondary | ICD-10-CM

## 2020-07-14 DIAGNOSIS — I4729 Other ventricular tachycardia: Secondary | ICD-10-CM

## 2020-07-14 DIAGNOSIS — T8453XD Infection and inflammatory reaction due to internal right knee prosthesis, subsequent encounter: Secondary | ICD-10-CM

## 2020-07-14 DIAGNOSIS — T827XXA Infection and inflammatory reaction due to other cardiac and vascular devices, implants and grafts, initial encounter: Secondary | ICD-10-CM | POA: Diagnosis not present

## 2020-07-14 DIAGNOSIS — I472 Ventricular tachycardia: Secondary | ICD-10-CM

## 2020-07-14 DIAGNOSIS — Z981 Arthrodesis status: Secondary | ICD-10-CM

## 2020-07-14 DIAGNOSIS — T8450XD Infection and inflammatory reaction due to unspecified internal joint prosthesis, subsequent encounter: Secondary | ICD-10-CM

## 2020-07-14 DIAGNOSIS — T8450XS Infection and inflammatory reaction due to unspecified internal joint prosthesis, sequela: Secondary | ICD-10-CM

## 2020-07-14 MED ORDER — RIFAMPIN 300 MG PO CAPS: ORAL_CAPSULE | ORAL | 11 refills | Status: DC

## 2020-07-14 MED ORDER — CEPHALEXIN 500 MG PO CAPS: 1000.0000 mg | ORAL_CAPSULE | Freq: Two times a day (BID) | ORAL | 11 refills | Status: DC

## 2020-07-14 NOTE — Progress Notes (Signed)
Virtual Visit via Telephone Note  I connected with [name] [name] on 07/14/20 at 11:30 AM EDT by telephone and verified that I am speaking with the correct person using two identifiers.  Location: Patient Home Provider: [facility]   I discussed the limitations, risks, security and privacy concerns of performing an evaluation and management service by telephone and the availability of in person appointments. I also discussed with the patient that there may be a patient responsible charge related to this service. The patient expressed understanding and agreed to proceed.   History of Present Illness:   85 year old Caucasian female who was initially admitted to [facility] on June 13, 2016 with fevers weakness and diffuse pain.  She had sepsis and hypotension was found to have MSSA bacteremia and transferred to [facility].  for continued care and assessment by EP team considering she has cardiac device. At [facility] she underwent evaluation for metastatic sites of infection including CT of spine, TTE/TEE and BCx monitoring. All repeat BCx were negative once transferred here. TTE/TEE were both negative for lead or valvular endocarditis at that time; received 4 weeks IV Ancef and decision to leave cardiac device.   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 She underwent single staged exchange arthroplasty on right knee by Dr. Veda Canning. Dr. Ladona Ridgel was not available for device extraction here so she was transferred to [facility] where she had cardiac device extracted on August 10th. She had blood cultures and device cultures done with no growth. PICC inserted on 08/18/16. She was sent out on IV cefazolin 2g IV q 8 but also IV rifampin 300 mg IV q 12 at SNF and then home. Managing FIVE infusions was overwhelming for her husband. I changed her to rifampin  300mg  orally BID and then Ceftriaxone 2gram IV push to complete 42 days of therapy post extraction. Initially her cardiac function had mproved quite nicely. We placed her on high dose keflex and bid rifampin and she has been on these meds without fail since I last saw her. She is approaching in December 4 months of systemic antimicrobials since extraction of her device and a litle more since I and D and exchange of liner of prosthesis.   Her EF had worsened and EP and Cardiology after careful consideration we implanted a biventricular pacemaker on January 17, 2017.   She continued on her cephalexin and rifampin.   [facility] to deliver her cephalexin or rifampin so that she went without any antibiotics for 2 weeks.  She then developed recurrent MSSA bacteremia and septic shock with a recurrent septic knee and was hospitalized at [facility].  Transesophageal echocardiogram showed a thrombus on her device.  She was seen by infectious disease cardiology and orthopedics.  She underwent I&D of the prosthesis without hardware removal bacteremia cleared.  She had MRI of her spine which was negative.  She had additional I&D on 15 December but again retention of hardware she was discharged on cefazolin and rifampin.  He did a phone visit with my partner Dr. Renold Don in January 2022.   She completed IV antibiotics and was placed back on cephalexin at 500 mg 4 times daily along with rifampin twice daily.  She did this for a month and then went back on Keflex 2 tablets twice daily along with rifampin twice daily.  She was supposed to follow-up with me in February or March but has not done so yet.  She is feeling well and her knee is minimally disc uncomfortable.  She did not come for appointment in person again due to the fact that her husband is still quite sick with problems with bleeding ulcers and is homebound.  I would like her to come in person  and have scheduled to come see me in September.  Certainly if she has recurrence of her septicemia again I would strongly push for removing all of her prosthetic knee as this is clearly a site and source of septicemia and removing it would not carry the morbidity and of removing her cardiac device yet again.      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HCC)    Left bundle branch block    MSSA bacteremia 06/21/2016   Myocardial infarction (HCC)    NICM (nonischemic cardiomyopathy) (HCC)    Pancreatitis, acute     Past Surgical History:  Procedure Laterality Date   ABDOMINAL HYSTERECTOMY     APPENDECTOMY     BIV PACEMAKER INSERTION CRT-P N/A 01/17/2017   Procedure: BIV PACEMAKER INSERTION CRT-P;  Surgeon: Duke Salvia, MD;  Location: [facility] INVASIVE CV LAB;  Service: Cardiovascular;  Laterality: N/A;   CARDIAC CATHETERIZATION     CARDIAC DEFIBRILLATOR PLACEMENT  ?2016   CATARACT EXTRACTION W/ INTRAOCULAR LENS  IMPLANT, BILATERAL Bilateral    CHOLECYSTECTOMY OPEN     "related to pancreatitis"   DILATION AND CURETTAGE OF UTERUS     EP IMPLANTABLE DEVICE N/A 07/04/2014   Procedure: BiV ICD Insertion CRT-D;  Surgeon: Duke Salvia, MD;  Location: [facility] INVASIVE CV LAB;  Service: Cardiovascular;  Laterality: N/A;   ESOPHAGOGASTRODUODENOSCOPY (EGD) WITH PROPOFOL N/A 05/08/2017   Procedure: ESOPHAGOGASTRODUODENOSCOPY  (EGD) WITH PROPOFOL;  Surgeon: Graylin Shiver, MD;  Location: [facility] ENDOSCOPY;  Service: Endoscopy;  Laterality: N/A;   HERNIA REPAIR     I & D KNEE WITH POLY EXCHANGE Right 08/11/2016   Procedure: IRRIGATION AND DEBRIDEMENT KNEE WITH POLY EXCHANGE;  Surgeon: Samson Frederic, MD;  Location: [facility] OR;  Service: Orthopedics;  Laterality: Right;   INSERT / REPLACE / REMOVE PACEMAKER  ?2016   JOINT REPLACEMENT Bilateral    "thumb joints; used my own material"   JOINT REPLACEMENT     LEFT HEART CATHETERIZATION WITH CORONARY ANGIOGRAM N/A 11/02/2013   Procedure: LEFT HEART CATHETERIZATION WITH CORONARY ANGIOGRAM;  Surgeon: Corky Crafts, MD;  Location: [facility] CATH LAB;  Service: Cardiovascular;  Laterality: N/A;   LOOP RECORDER IMPLANT N/A 04/13/2014   Procedure: LOOP RECORDER IMPLANT;  Surgeon: Duke Salvia, MD;  Location: [facility] CATH LAB;  Service: Cardiovascular;  Laterality: N/A;   LOOP RECORDER REMOVAL  ?2016   POSTERIOR LUMBAR FUSION  05/03/2016   Lumbar Two-Three, Lumbar Three-Four Posterior Lumbar  Fusion withLaminectomy and Foraminotomy   SPINE SURGERY  04/2016   lumbar    TEE WITHOUT CARDIOVERSION N/A 08/14/2016   Procedure: TRANSESOPHAGEAL ECHOCARDIOGRAM (TEE);  Surgeon: Lars Masson, MD;  Location: [facility] ENDOSCOPY;  Service: Cardiovascular;  Laterality: N/A;   TONSILLECTOMY     TOTAL HIP ARTHROPLASTY Bilateral    UMBILICAL HERNIA REPAIR  1970s?    Family History  Problem Relation Age of Onset   Hyperlipidemia Mother    Arrhythmia Mother        palpitations   Heart disease Father    Heart attack Father    Heart attack Maternal Grandfather    Heart attack Paternal Grandfather       Social History   Socioeconomic History   Marital status: Married    Spouse name: Not on file   Number of children: Not on file   Years of education: Not on file   Highest education level: Not on file  Occupational History   Not on file  Tobacco Use   Smoking status: Never   Smokeless tobacco:  Never   Tobacco comments:    "smoked as a teenager; for maybe 1 wk"  Vaping Use   Vaping Use: Never used  Substance and Sexual Activity   Alcohol use: Not Currently    Comment: 6/15//2018 "glass of wine q 6 months or so"   Drug use: No   Sexual activity: Not on file  Other Topics Concern   Not on file  Social History Narrative   Not on file   Social Determinants of Health   Financial Resource Strain: Not on file  Food Insecurity: Not on file  Transportation Needs: Not on file  Physical Activity: Not on file  Stress: Not on file  Social Connections: Not on file    Allergies  Allergen Reactions   Sulfa Antibiotics Hives     Current Outpatient Medications:    acetaminophen (TYLENOL) 325 MG tablet, Take 650 mg by mouth every 6 (six) hours as needed., Disp: , Rfl:    albuterol (VENTOLIN HFA) 108 (90 Base) MCG/ACT inhaler, Inhale 2 puffs into the lungs every 6 (six) hours as needed for wheezing or shortness of breath., Disp: , Rfl:    Aspirin-Acetaminophen-Caffeine (EXCEDRIN PO), Take 1 tablet by mouth as needed., Disp: , Rfl:    betamethasone dipropionate (DIPROLENE) 0.05 % ointment, Apply 1 application topically daily as needed., Disp: , Rfl:    Calcium Carbonate Antacid (TUMS PO), Take 1 tablet by mouth daily as needed (indigestion). , Disp: , Rfl:    carvedilol (COREG) 12.5 MG tablet, Take 1 tablet (12.5 mg total) by mouth 2 (two) times daily., Disp: 180 tablet, Rfl: 3   cephALEXin (KEFLEX) 500 MG capsule, Take 2 capsules (1,000 mg total) by mouth 2 (two) times daily., Disp: 120 capsule, Rfl: 11   famotidine (PEPCID) 20 MG tablet, Take 20 mg by mouth 2 (two) times daily as needed., Disp: , Rfl:    feeding supplement, ENSURE ENLIVE, (ENSURE ENLIVE) LIQD, Take 237 mLs by mouth 2 (two) times daily between meals., Disp: 237 mL, Rfl: 12   Fluticasone-Salmeterol (ADVAIR) 250-50 MCG/DOSE AEPB, Inhale 1 puff into the lungs 2 (two) times daily., Disp: , Rfl:    furosemide (LASIX) 40 MG  tablet, TAKE 1 TABLET DAILY (DOSE INCREASED), Disp: 90 tablet, Rfl: 3   levothyroxine (SYNTHROID) 175 MCG tablet, Take 1 tablet (175 mcg total) by mouth daily before breakfast., Disp: 90 tablet, Rfl: 1   Melatonin 3 MG  TABS, Take 3 mg by mouth at bedtime as needed (for sleep.). , Disp: , Rfl:    methocarbamol (ROBAXIN) 500 MG tablet, Take 1 tablet (500 mg total) by mouth every 6 (six) hours as needed for muscle spasms., Disp: 10 tablet, Rfl: 0   methylphenidate (RITALIN) 20 MG tablet, Take 40 mg by mouth daily., Disp: , Rfl:    Naphazoline-Pheniramine (ALLERGY EYE OP), Place 1-2 drops into both eyes daily as needed (for irritated eyes)., Disp: , Rfl:    naproxen (NAPROSYN) 500 MG tablet, Take 500 mg by mouth 2 (two) times daily as needed., Disp: , Rfl:    nitroGLYCERIN (NITROSTAT) 0.4 MG SL tablet, Place 1 tablet (0.4 mg total) every 5 (five) minutes as needed under the tongue for chest pain., Disp: 90 tablet, Rfl: 3   ondansetron (ZOFRAN) 8 MG tablet, Take 8 mg by mouth as needed for nausea or vomiting., Disp: , Rfl:    pantoprazole (PROTONIX) 40 MG tablet, Take 40 mg by mouth daily., Disp: , Rfl:    polyethylene glycol (MIRALAX / GLYCOLAX) packet, Take 17 g by mouth daily as needed for mild constipation., Disp: 14 each, Rfl: 0   rifampin (RIFADIN) 300 MG capsule, TAKE 1 CAPSULE BY MOUTH EVERY 12 HOURS, Disp: 60 capsule, Rfl: 11   sacubitril-valsartan (ENTRESTO) 24-26 MG, Take 1 tablet by mouth 2 (two) times daily., Disp: 180 tablet, Rfl: 2   sucralfate (CARAFATE) 1 g tablet, Take 1 g by mouth daily., Disp: , Rfl:     Observations/Objective:  She appeared to be doing well over the telephone and seem to be in relatively good spirits  Assessment and Plan:  Recurrent MSSA bacteremia with prosthetic knee infection status post I&D of knee recurrence of bacteremia removal of her cardiac device continued antibiotics but now with recurrence of bacteremia when her antibiotics were not given to her for  2 weeks with recurrence of infection at her prosthetic knee status post I&D x2 without removal of prosthetic material, TEE showing involvement again of her cardiac device.  She is completed parenteral therapy and now on oral therapy with Keflex and rifampin.  I am switching her medications over to her local [facility] so that she is no longer relying on the mail order pharmacy especially 1 that failed to deliver her medications.      Follow Up Instructions:    I discussed the assessment and treatment plan with the patient. The patient was provided an opportunity to ask questions and all were answered. The patient agreed with the plan and demonstrated an understanding of the instructions.   The patient was advised to call back or seek an in-person evaluation if the symptoms worsen or if the condition fails to improve as anticipated.  I provided 25 minutes of non-face-to-face time during this encounter.   [name], MD

## 2020-07-20 ENCOUNTER — Other Ambulatory Visit: Payer: Self-pay

## 2020-07-20 DIAGNOSIS — T827XXA Infection and inflammatory reaction due to other cardiac and vascular devices, implants and grafts, initial encounter: Secondary | ICD-10-CM

## 2020-07-20 DIAGNOSIS — I428 Other cardiomyopathies: Secondary | ICD-10-CM

## 2020-07-20 DIAGNOSIS — I5022 Chronic systolic (congestive) heart failure: Secondary | ICD-10-CM

## 2020-07-20 DIAGNOSIS — E032 Hypothyroidism due to medicaments and other exogenous substances: Secondary | ICD-10-CM

## 2020-07-20 DIAGNOSIS — Z95810 Presence of automatic (implantable) cardiac defibrillator: Secondary | ICD-10-CM

## 2020-07-20 DIAGNOSIS — Z79899 Other long term (current) drug therapy: Secondary | ICD-10-CM

## 2020-07-20 DIAGNOSIS — R7989 Other specified abnormal findings of blood chemistry: Secondary | ICD-10-CM

## 2020-07-20 MED ORDER — FUROSEMIDE 40 MG PO TABS: ORAL_TABLET | ORAL | 3 refills | Status: AC

## 2020-07-31 ENCOUNTER — Ambulatory Visit (INDEPENDENT_AMBULATORY_CARE_PROVIDER_SITE_OTHER): Payer: Medicare Other | Admitting: Internal Medicine

## 2020-07-31 ENCOUNTER — Encounter: Payer: Self-pay | Admitting: Internal Medicine

## 2020-07-31 ENCOUNTER — Other Ambulatory Visit: Payer: Self-pay

## 2020-07-31 VITALS — BP 110/50 | HR 70 | Ht 62.0 in | Wt 139.0 lb

## 2020-07-31 DIAGNOSIS — Z95810 Presence of automatic (implantable) cardiac defibrillator: Secondary | ICD-10-CM

## 2020-07-31 DIAGNOSIS — I428 Other cardiomyopathies: Secondary | ICD-10-CM

## 2020-07-31 DIAGNOSIS — I5022 Chronic systolic (congestive) heart failure: Secondary | ICD-10-CM | POA: Diagnosis not present

## 2020-07-31 NOTE — Progress Notes (Signed)
Patient Care Team: [name], MD as PCP - General (Family Medicine) Lars Masson, MD (Inactive) as PCP - Cardiology (Cardiology) Duke Salvia, MD as PCP - Electrophysiology (Cardiology)   HPI  [name] [name] is a 85 y.o. female Seen in follow-up for nonischemic cardiomyopathy left bundle branch block and nonsustained ventricular tachycardia.   She underwent CRT Biotronik implantation  6/16.  6/18  hospitalized and found to have staph bacteremia of (MSSA) in the wake of orthopedic surgery  TEE at [facility] was reportedly negative. She was treated with  6 weeks of antibiotics  She developed recurrent 'septic arthritis" and bacteremia and TEE>>Vegetation on RA lead.  Transferred to [facility] for extraction which she tolerated well   Rx with 8 weeks of IV Abx, most recently 10/18 keflex and rifampin; these are anticipated to last at least 6 months   Because of worsening left ventricular function and worsening functional status she underwent CRT reimplantation 1/19 aware that she would need long-term chronic suppressive antibiotics  Developed recurrent bacteremia fall 2021 when for some reason related to logistics, she missed her antibiotics for a couple of weeks.  She underwent surgical debridement/irrigation of her knee.  She has been maintained on antibiotics.  Note reviewed 7/22 ID   Minimal shortness of breath.  No chest pain no edema.  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HCC)    Left bundle branch block    MSSA bacteremia 06/21/2016   Myocardial infarction (HCC)    NICM (nonischemic cardiomyopathy) (HCC)    Pancreatitis, acute     Past Surgical History:  Procedure Laterality Date   ABDOMINAL HYSTERECTOMY     APPENDECTOMY     BIV PACEMAKER INSERTION CRT-P N/A 01/17/2017   Procedure: BIV PACEMAKER INSERTION CRT-P;  Surgeon: Duke Salvia, MD;  Location: [facility] INVASIVE CV LAB;  Service: Cardiovascular;  Laterality: N/A;   CARDIAC CATHETERIZATION     CARDIAC DEFIBRILLATOR PLACEMENT  ?2016   CATARACT EXTRACTION W/ INTRAOCULAR LENS  IMPLANT, BILATERAL Bilateral    CHOLECYSTECTOMY OPEN     "related to pancreatitis"   DILATION AND CURETTAGE OF UTERUS     EP IMPLANTABLE DEVICE N/A 07/04/2014   Procedure: BiV ICD Insertion CRT-D;  Surgeon: Duke Salvia, MD;  Location: [facility] INVASIVE CV LAB;  Service: Cardiovascular;  Laterality: N/A;  ESOPHAGOGASTRODUODENOSCOPY (EGD) WITH PROPOFOL N/A 05/08/2017   Procedure: ESOPHAGOGASTRODUODENOSCOPY (EGD) WITH PROPOFOL;  Surgeon: Graylin Shiver, MD;  Location: [facility] ENDOSCOPY;  Service: Endoscopy;  Laterality: N/A;   HERNIA REPAIR     I & D KNEE WITH POLY EXCHANGE Right 08/11/2016   Procedure: IRRIGATION AND DEBRIDEMENT KNEE WITH POLY EXCHANGE;  Surgeon: Samson Frederic, MD;  Location: [facility] OR;  Service: Orthopedics;  Laterality: Right;   INSERT / REPLACE / REMOVE PACEMAKER  ?2016   JOINT REPLACEMENT Bilateral    "thumb joints; used my own material"   JOINT REPLACEMENT     LEFT HEART CATHETERIZATION WITH CORONARY ANGIOGRAM  N/A 11/02/2013   Procedure: LEFT HEART CATHETERIZATION WITH CORONARY ANGIOGRAM;  Surgeon: Corky Crafts, MD;  Location: [facility] CATH LAB;  Service: Cardiovascular;  Laterality: N/A;   LOOP RECORDER IMPLANT N/A 04/13/2014   Procedure: LOOP RECORDER IMPLANT;  Surgeon: Duke Salvia, MD;  Location: [facility] CATH LAB;  Service: Cardiovascular;  Laterality: N/A;   LOOP RECORDER REMOVAL  ?2016   POSTERIOR LUMBAR FUSION  05/03/2016   Lumbar Two-Three, Lumbar Three-Four Posterior Lumbar Fusion withLaminectomy and Foraminotomy   SPINE SURGERY  04/2016   lumbar    TEE WITHOUT CARDIOVERSION N/A 08/14/2016   Procedure: TRANSESOPHAGEAL ECHOCARDIOGRAM (TEE);  Surgeon: Lars Masson, MD;  Location: [facility] ENDOSCOPY;  Service: Cardiovascular;  Laterality: N/A;   TONSILLECTOMY     TOTAL HIP ARTHROPLASTY Bilateral    UMBILICAL HERNIA REPAIR  1970s?    Current Outpatient Medications  Medication Sig Dispense Refill   acetaminophen (TYLENOL) 325 MG tablet Take 650 mg by mouth every 6 (six) hours as needed.     albuterol (VENTOLIN HFA) 108 (90 Base) MCG/ACT inhaler Inhale 2 puffs into the lungs every 6 (six) hours as needed for wheezing or shortness of breath.     Aspirin-Acetaminophen-Caffeine (EXCEDRIN PO) Take 1 tablet by mouth as needed.     betamethasone dipropionate (DIPROLENE) 0.05 % ointment Apply 1 application topically daily as needed.     Calcium Carbonate Antacid (TUMS PO) Take 1 tablet by mouth daily as needed (indigestion).      carvedilol (COREG) 12.5 MG tablet Take 1 tablet (12.5 mg total) by mouth 2 (two) times daily. 180 tablet 3   cephALEXin (KEFLEX) 500 MG capsule Take 2 capsules (1,000 mg total) by mouth 2 (two) times daily. 120 capsule 11   famotidine (PEPCID) 20 MG tablet Take 20 mg by mouth 2 (two) times daily as needed.     feeding supplement, ENSURE ENLIVE, (ENSURE ENLIVE) LIQD Take 237 mLs by mouth 2 (two) times daily between meals. 237 mL 12   Fluticasone-Salmeterol (ADVAIR) 250-50 MCG/DOSE  AEPB Inhale 1 puff into the lungs 2 (two) times daily.     furosemide (LASIX) 40 MG tablet TAKE 1 TABLET DAILY (DOSE INCREASED) 90 tablet 3   levothyroxine (SYNTHROID) 175 MCG tablet Take 1 tablet (175 mcg total) by mouth daily before breakfast. 90 tablet 1   Melatonin 3 MG TABS Take 3 mg by mouth at bedtime as needed (for sleep.).      methocarbamol (ROBAXIN) 500 MG tablet Take 1 tablet (500 mg total) by mouth every 6 (six) hours as needed for muscle spasms. 10 tablet 0   methylphenidate (RITALIN) 20 MG tablet Take 40 mg by mouth daily.     Naphazoline-Pheniramine (ALLERGY EYE OP) Place 1-2 drops into both eyes daily as needed (for irritated eyes).     naproxen (NAPROSYN) 500 MG tablet Take 500 mg by  mouth 2 (two) times daily as needed.     ondansetron (ZOFRAN) 8 MG tablet Take 8 mg by mouth as needed for nausea or vomiting.     pantoprazole (PROTONIX) 40 MG tablet Take 40 mg by mouth daily.     polyethylene glycol (MIRALAX / GLYCOLAX) packet Take 17 g by mouth daily as needed for mild constipation. 14 each 0   rifampin (RIFADIN) 300 MG capsule TAKE 1 CAPSULE BY MOUTH EVERY 12 HOURS 60 capsule 11   sacubitril-valsartan (ENTRESTO) 24-26 MG Take 1 tablet by mouth 2 (two) times daily. 180 tablet 2   sucralfate (CARAFATE) 1 g tablet Take 1 g by mouth daily.     nitroGLYCERIN (NITROSTAT) 0.4 MG SL tablet Place 1 tablet (0.4 mg total) every 5 (five) minutes as needed under the tongue for chest pain. 90 tablet 3   No current facility-administered medications for this visit.    Allergies  Allergen Reactions   Sulfa Antibiotics Hives    Review of Systems negative except from HPI and PMH  Physical Exam BP (!) 110/50 (BP Location: Left Arm, Patient Position: Sitting, Cuff Size: Normal)   Pulse 70   Ht 5\' 2"  (1.575 m)   Wt 139 lb (63 kg)   LMP  (LMP Unknown)   SpO2 98%   BMI 25.42 kg/m  Well developed and well nourished in no acute distress HENT normal Neck supple with  JVP-flat Clear Device pocket well healed; without hematoma or erythema.  There is no tethering  Regular rate and rhythm, no  gallop No murmur Abd-soft with active BS No Clubbing cyanosis  edema Skin-warm and dry A & Oriented  Grossly normal sensory and motor function  ECG sinus at 70 Interval 17/13/45 QRS negative in lead I upright lead V1 with this very small QRS   Assessment and  Plan  Nonischemic cardiomyopathy    Left bundle branch block  CRT-D  reimplantation  Congestive heart failure class III  Hypertension  Septic Arthritis--chronic antibiotic suppression     Congestive heart failure status currently is stable.  She is euvolemic.  continue her Lasix 40  Her cardiomyopathy much improved, LVEF had normalized by echo at [facility] 12/21.  We will continue carvedilol 12.5 twice daily and Entresto 24/26  She will remain on indefinite chronic suppressive antibiotics given her knee issues and her device.  There are discussions regarding removal of the infected knee hardware if septicemia recurs.  This may inform antibiotic recommendations going forward.

## 2020-07-31 NOTE — Patient Instructions (Signed)
Medication Instructions:  Your physician recommends that you continue on your current medications as directed. Please refer to the Current Medication list given to you today.  *If you need a refill on your cardiac medications before your next appointment, please call your pharmacy*   Lab Work: None ordered.  If you have labs (blood work) drawn today and your tests are completely normal, you will receive your results only by: MyChart Message (if you have MyChart) OR A paper copy in the mail If you have any lab test that is abnormal or we need to change your treatment, we will call you to review the results.   Testing/Procedures: None ordered.    Follow-Up: At [facility], you and your health needs are our priority.  As part of our continuing mission to provide you with exceptional heart care, we have created designated Provider Care Teams.  These Care Teams include your primary Cardiologist (physician) and Advanced Practice Providers (APPs -  Physician Assistants and Nurse Practitioners) who all work together to provide you with the care you need, when you need it.  We recommend signing up for the patient portal called "MyChart".  Sign up information is provided on this After Visit Summary.  MyChart is used to connect with patients for Virtual Visits (Telemedicine).  Patients are able to view lab/test results, encounter notes, upcoming appointments, etc.  Non-urgent messages can be sent to your provider as well.   To learn more about what you can do with MyChart, go to [email or web address].    Your next appointment:   12 month(s)  The format for your next appointment:   In Person  Provider:   Sherryl Manges, MD   Other Instructions Please schedule 6 month appointment with Dr Laurance Flatten

## 2020-08-21 ENCOUNTER — Ambulatory Visit (INDEPENDENT_AMBULATORY_CARE_PROVIDER_SITE_OTHER): Payer: Medicare Other

## 2020-08-21 ENCOUNTER — Other Ambulatory Visit: Payer: Self-pay | Admitting: *Deleted

## 2020-08-21 DIAGNOSIS — Z95810 Presence of automatic (implantable) cardiac defibrillator: Secondary | ICD-10-CM

## 2020-08-21 DIAGNOSIS — I428 Other cardiomyopathies: Secondary | ICD-10-CM

## 2020-08-21 DIAGNOSIS — I5022 Chronic systolic (congestive) heart failure: Secondary | ICD-10-CM

## 2020-08-21 MED ORDER — ENTRESTO 24-26 MG PO TABS
1.0000 | ORAL_TABLET | Freq: Two times a day (BID) | ORAL | 2 refills | Status: DC
Start: 2020-08-21 — End: 2020-11-10

## 2020-08-22 LAB — CUP PACEART REMOTE DEVICE CHECK
Battery Remaining Longevity: 75 mo
Battery Voltage: 2.97 V
Brady Statistic AP VP Percent: 15.81 %
Brady Statistic AP VS Percent: 0.12 %
Brady Statistic AS VP Percent: 80.82 %
Brady Statistic AS VS Percent: 3.25 %
Brady Statistic RA Percent Paced: 16.42 %
Brady Statistic RV Percent Paced: 26.82 %
Date Time Interrogation Session: 20220815011450
Implantable Lead Implant Date: 20190111
Implantable Lead Implant Date: 20190111
Implantable Lead Implant Date: 20190111
Implantable Lead Location: 753858
Implantable Lead Location: 753859
Implantable Lead Location: 753860
Implantable Lead Model: 5076
Implantable Lead Model: 5076
Implantable Pulse Generator Implant Date: 20190111
Lead Channel Impedance Value: 1425 Ohm
Lead Channel Impedance Value: 1501 Ohm
Lead Channel Impedance Value: 1520 Ohm
Lead Channel Impedance Value: 1520 Ohm
Lead Channel Impedance Value: 1558 Ohm
Lead Channel Impedance Value: 1653 Ohm
Lead Channel Impedance Value: 361 Ohm
Lead Channel Impedance Value: 380 Ohm
Lead Channel Impedance Value: 437 Ohm
Lead Channel Impedance Value: 475 Ohm
Lead Channel Impedance Value: 741 Ohm
Lead Channel Impedance Value: 779 Ohm
Lead Channel Impedance Value: 836 Ohm
Lead Channel Impedance Value: 893 Ohm
Lead Channel Pacing Threshold Amplitude: 0.5 V
Lead Channel Pacing Threshold Amplitude: 0.875 V
Lead Channel Pacing Threshold Amplitude: 2.5 V
Lead Channel Pacing Threshold Pulse Width: 0.4 ms
Lead Channel Pacing Threshold Pulse Width: 0.4 ms
Lead Channel Pacing Threshold Pulse Width: 1 ms
Lead Channel Sensing Intrinsic Amplitude: 1.625 mV
Lead Channel Sensing Intrinsic Amplitude: 1.625 mV
Lead Channel Sensing Intrinsic Amplitude: 9 mV
Lead Channel Sensing Intrinsic Amplitude: 9 mV
Lead Channel Setting Pacing Amplitude: 2 V
Lead Channel Setting Pacing Amplitude: 2.5 V
Lead Channel Setting Pacing Amplitude: 2.5 V
Lead Channel Setting Pacing Pulse Width: 0.4 ms
Lead Channel Setting Pacing Pulse Width: 1 ms
Lead Channel Setting Sensing Sensitivity: 0.9 mV

## 2020-09-09 NOTE — Progress Notes (Signed)
Remote pacemaker transmission.   

## 2020-09-15 ENCOUNTER — Other Ambulatory Visit: Payer: Self-pay

## 2020-09-15 ENCOUNTER — Ambulatory Visit (INDEPENDENT_AMBULATORY_CARE_PROVIDER_SITE_OTHER): Payer: Medicare Other | Admitting: Infectious Disease

## 2020-09-15 DIAGNOSIS — T827XXA Infection and inflammatory reaction due to other cardiac and vascular devices, implants and grafts, initial encounter: Secondary | ICD-10-CM

## 2020-09-15 DIAGNOSIS — I5022 Chronic systolic (congestive) heart failure: Secondary | ICD-10-CM | POA: Diagnosis not present

## 2020-09-15 DIAGNOSIS — T8453XD Infection and inflammatory reaction due to internal right knee prosthesis, subsequent encounter: Secondary | ICD-10-CM

## 2020-09-15 DIAGNOSIS — Z95810 Presence of automatic (implantable) cardiac defibrillator: Secondary | ICD-10-CM | POA: Diagnosis not present

## 2020-09-15 DIAGNOSIS — R7881 Bacteremia: Secondary | ICD-10-CM

## 2020-09-15 DIAGNOSIS — T8450XD Infection and inflammatory reaction due to unspecified internal joint prosthesis, subsequent encounter: Secondary | ICD-10-CM | POA: Diagnosis not present

## 2020-09-15 DIAGNOSIS — B9561 Methicillin susceptible Staphylococcus aureus infection as the cause of diseases classified elsewhere: Secondary | ICD-10-CM

## 2020-09-15 DIAGNOSIS — T8450XS Infection and inflammatory reaction due to unspecified internal joint prosthesis, sequela: Secondary | ICD-10-CM

## 2020-09-15 MED ORDER — CEPHALEXIN 500 MG PO CAPS: 1000.0000 mg | ORAL_CAPSULE | Freq: Two times a day (BID) | ORAL | 11 refills | Status: DC

## 2020-09-15 MED ORDER — RIFAMPIN 300 MG PO CAPS: ORAL_CAPSULE | ORAL | 11 refills | Status: DC

## 2020-09-15 NOTE — Progress Notes (Signed)
Virtual Visit via Telephone Note  I connected with [name] [name] on 09/15/20 at 10:30 AM EDT by telephone and verified that I am speaking with the correct person using two identifiers.  Location: Patient Home Provider: [facility]   I discussed the limitations, risks, security and privacy concerns of performing an evaluation and management service by telephone and the availability of in person appointments. I also discussed with the patient that there may be a patient responsible charge related to this service. The patient expressed understanding and agreed to proceed.   History of Present Illness:   85 year old Caucasian female who was initially admitted to [facility] on June 13, 2016 with fevers weakness and diffuse pain.  She had sepsis and hypotension was found to have MSSA bacteremia and transferred to [facility].  for continued care and assessment by EP team considering she has cardiac device. At [facility] she underwent evaluation for metastatic sites of infection including CT of spine, TTE/TEE and BCx monitoring. All repeat BCx were negative once transferred here. TTE/TEE were both negative for lead or valvular endocarditis at that time; received 4 weeks IV Ancef and decision to leave cardiac device.   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 She underwent single staged exchange arthroplasty on right knee by [name] [name]. [name] [name] was not available for device extraction here so she was transferred to [facility] where she had cardiac device extracted on August 10th. She had blood cultures and device cultures done with no growth. PICC inserted on 08/18/16. She was sent out on IV cefazolin 2g IV q 8 but also IV rifampin 300 mg IV q 12 at SNF and then home. Managing FIVE infusions was overwhelming for her husband. I changed her to rifampin  300mg  orally BID and then Ceftriaxone 2gram IV push to complete 42 days of therapy post extraction. Initially her cardiac function had mproved quite nicely. We placed her on high dose keflex and bid rifampin and she has been on these meds without fail since I last saw her. She is approaching in December 4 months of systemic antimicrobials since extraction of her device and a litle more since I and D and exchange of liner of prosthesis.   Her EF had worsened and EP and Cardiology after careful consideration we implanted a biventricular pacemaker on January 17, 2017.   She continued on her cephalexin and rifampin.   [facility] her [facility]exin or rifampin so that she went without any antibiotics for 2 weeks.  She then developed recurrent MSSA bacteremia and septic shock with a recurrent septic knee and was hospitalized at [facility].  Transesophageal echocardiogram showed a thrombus on her device.  She was seen by infectious disease cardiology and orthopedics.  She underwent I&D of the prosthesis without hardware removal bacteremia cleared.  She had MRI of her spine which was negative.  She had additional I&D on 15 December but again retention of hardware she was discharged on cefazolin and rifampin.  He did a phone visit with my partner Dr. 17 December in January 2022.   She completed IV antibiotics and was placed back on cephalexin at 500 mg 4 times daily along with rifampin twice daily.  She did this for a month and then went back on Keflex 2 tablets twice daily along with rifampin twice daily.  She was supposed to follow-up with me in February or March  but did not do so.   She follows up with me via phone visit in July.  She was scheduled see me in person today but again preferred to do it via phone.  The reason for her request is that she is still having to take care of her husband quite a bit.  He apparently succumbed to COVID-19  infection shortly after being vaccinated and has been profoundly weak with particular lower extremity weakness.  He is requiring quite a bit of help from [name] L.  She is doing well though and says that she has minimal pain in her knee.  She is apprehensive when the antibiotics appear to barely come on time from the mail order pharmacy.  She has seen [name] [name] is happy how she is doing in terms of her cardiac function.    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HCC)    Left bundle branch block    MSSA bacteremia 06/21/2016   Myocardial infarction (HCC)    NICM (nonischemic cardiomyopathy) (HCC)    Pancreatitis, acute     Past Surgical History:  Procedure Laterality Date   ABDOMINAL HYSTERECTOMY     APPENDECTOMY     BIV PACEMAKER INSERTION CRT-P N/A 01/17/2017   Procedure: BIV PACEMAKER INSERTION CRT-P;  Surgeon: [name] Salvia, MD;  Location: [facility] INVASIVE CV LAB;  Service: Cardiovascular;  Laterality: N/A;   CARDIAC CATHETERIZATION     CARDIAC DEFIBRILLATOR PLACEMENT  ?2016   CATARACT EXTRACTION W/ INTRAOCULAR LENS  IMPLANT, BILATERAL Bilateral    CHOLECYSTECTOMY OPEN     "related to pancreatitis"   DILATION AND CURETTAGE OF UTERUS     EP IMPLANTABLE DEVICE N/A 07/04/2014   Procedure: BiV ICD Insertion CRT-D;  Surgeon: [name] Salvia, MD;  Location: [facility] INVASIVE CV LAB;  Service: Cardiovascular;   Laterality: N/A;   ESOPHAGOGASTRODUODENOSCOPY (EGD) WITH PROPOFOL N/A 05/08/2017   Procedure: ESOPHAGOGASTRODUODENOSCOPY (EGD) WITH PROPOFOL;  Surgeon: Graylin Shiver, MD;  Location: [facility] ENDOSCOPY;  Service: Endoscopy;  Laterality: N/A;   HERNIA REPAIR     I & D KNEE WITH POLY EXCHANGE Right 08/11/2016   Procedure: IRRIGATION AND DEBRIDEMENT KNEE WITH POLY EXCHANGE;  Surgeon: Samson Frederic, MD;  Location: [facility] OR;  Service: Orthopedics;  Laterality: Right;   INSERT / REPLACE / REMOVE PACEMAKER  ?2016   JOINT REPLACEMENT Bilateral    "thumb joints; used my own material"   JOINT REPLACEMENT     LEFT HEART CATHETERIZATION WITH CORONARY ANGIOGRAM N/A 11/02/2013   Procedure: LEFT HEART CATHETERIZATION WITH CORONARY ANGIOGRAM;  Surgeon: Corky Crafts, MD;  Location: [facility] CATH LAB;  Service: Cardiovascular;  Laterality: N/A;   LOOP RECORDER IMPLANT N/A 04/13/2014   Procedure: LOOP RECORDER IMPLANT;  Surgeon: [name] Salvia, MD;  Location: [facility] CATH LAB;  Service: Cardiovascular;  Laterality: N/A;   LOOP RECORDER REMOVAL  ?2016   POSTERIOR LUMBAR FUSION  05/03/2016   Lumbar Two-Three, Lumbar Three-Four Posterior Lumbar Fusion withLaminectomy and Foraminotomy   SPINE SURGERY  04/2016   lumbar    TEE WITHOUT CARDIOVERSION N/A 08/14/2016   Procedure: TRANSESOPHAGEAL ECHOCARDIOGRAM (TEE);  Surgeon: Lars Masson, MD;  Location: [facility] ENDOSCOPY;  Service: Cardiovascular;  Laterality: N/A;   TONSILLECTOMY     TOTAL HIP ARTHROPLASTY Bilateral    UMBILICAL HERNIA REPAIR  1970s?    Family History  Problem Relation Age of Onset   Hyperlipidemia Mother    Arrhythmia Mother        palpitations   Heart disease Father    Heart attack Father    Heart attack Maternal Grandfather    Heart attack Paternal Grandfather       Social History   Socioeconomic History   Marital status: Married    Spouse name: Not on file   Number of children: Not on file   Years of education: Not on file   Highest education  level: Not on file  Occupational History   Not on file  Tobacco Use   Smoking status: Never   Smokeless tobacco: Never   Tobacco comments:    "smoked as a teenager; for maybe 1 wk"  Vaping Use   Vaping Use: Never used  Substance and Sexual Activity   Alcohol use: Not Currently    Comment: 6/15//2018 "glass of wine q 6 months or so"   Drug use: No   Sexual activity: Not on file  Other Topics Concern   Not on file  Social History Narrative   Not on file   Social Determinants of Health   Financial Resource Strain: Not on file  Food Insecurity: Not on file  Transportation Needs: Not on file  Physical Activity: Not on file  Stress: Not on file  Social Connections: Not on file    Allergies  Allergen Reactions   Sulfa Antibiotics Hives     Current Outpatient Medications:    acetaminophen (TYLENOL) 325 MG tablet, Take 650 mg by mouth every 6 (six) hours as needed., Disp: , Rfl:    albuterol (VENTOLIN HFA) 108 (90 Base) MCG/ACT inhaler, Inhale 2 puffs into the lungs every 6 (six) hours as needed for wheezing or shortness of breath., Disp: , Rfl:    Aspirin-Acetaminophen-Caffeine (EXCEDRIN PO), Take 1 tablet by mouth as needed., Disp: , Rfl:    betamethasone dipropionate (DIPROLENE) 0.05 % ointment, Apply 1 application topically daily as needed., Disp: , Rfl:    carvedilol (COREG) 12.5 MG tablet, Take 1 tablet (12.5 mg total) by mouth 2 (two) times daily., Disp: 180 tablet, Rfl: 3   cephALEXin (KEFLEX) 500 MG capsule, Take 2 capsules (1,000 mg total) by mouth 2 (two) times daily., Disp: 120 capsule, Rfl: 11   famotidine (PEPCID) 20 MG tablet, Take 20 mg by mouth 2 (two) times daily as needed., Disp: , Rfl:    feeding supplement, ENSURE ENLIVE, (ENSURE ENLIVE) LIQD, Take 237 mLs by mouth 2 (two) times daily between meals., Disp: 237 mL, Rfl: 12   Fluticasone-Salmeterol (ADVAIR) 250-50 MCG/DOSE AEPB, Inhale 1 puff into the lungs 2 (two) times daily., Disp: , Rfl:    furosemide  (LASIX) 40 MG tablet, TAKE 1 TABLET DAILY (DOSE INCREASED), Disp: 90 tablet, Rfl: 3   levothyroxine (SYNTHROID) 175 MCG tablet, Take 1 tablet (175 mcg total) by mouth daily before breakfast., Disp: 90 tablet, Rfl:  1   Melatonin 3 MG TABS, Take 3 mg by mouth at bedtime as needed (for sleep.). , Disp: , Rfl:    methocarbamol (ROBAXIN) 500 MG tablet, Take 1 tablet (500 mg total) by mouth every 6 (six) hours as needed for muscle spasms., Disp: 10 tablet, Rfl: 0   methylphenidate (RITALIN) 20 MG tablet, Take 40 mg by mouth daily., Disp: , Rfl:    Naphazoline-Pheniramine (ALLERGY EYE OP), Place 1-2 drops into both eyes daily as needed (for irritated eyes)., Disp: , Rfl:    naproxen (NAPROSYN) 500 MG tablet, Take 500 mg by mouth 2 (two) times daily as needed., Disp: , Rfl:    nitroGLYCERIN (NITROSTAT) 0.4 MG SL tablet, Place 1 tablet (0.4 mg total) every 5 (five) minutes as needed under the tongue for chest pain., Disp: 90 tablet, Rfl: 3   ondansetron (ZOFRAN) 8 MG tablet, Take 8 mg by mouth as needed for nausea or vomiting., Disp: , Rfl:    pantoprazole (PROTONIX) 40 MG tablet, Take 40 mg by mouth daily., Disp: , Rfl:    polyethylene glycol (MIRALAX / GLYCOLAX) packet, Take 17 g by mouth daily as needed for mild constipation., Disp: 14 each, Rfl: 0   rifampin (RIFADIN) 300 MG capsule, TAKE 1 CAPSULE BY MOUTH EVERY 12 HOURS, Disp: 60 capsule, Rfl: 11   sacubitril-valsartan (ENTRESTO) 24-26 MG, Take 1 tablet by mouth 2 (two) times daily., Disp: 180 tablet, Rfl: 2   sucralfate (CARAFATE) 1 g tablet, Take 1 g by mouth daily., Disp: , Rfl:    Calcium Carbonate Antacid (TUMS PO), Take 1 tablet by mouth daily as needed (indigestion).  (Patient not taking: Reported on 09/15/2020), Disp: , Rfl:     Observations/Objective:  Geraline appeared to be doing well over the telephone  Assessment and Plan:  Recurrent MSSA bacteremia prosthetic knee infection status post I&D of knee with recurrence of bacteremia removal  of her cardiac device continued antibiotics then recurrence when her antibiotics were not given for 2 weeks with recurrence her prosthetic knee infection status post I&D times twice with retention of prosthetic material and again involvement of her cardiac device status post IV antibiotics again and now chronic oral antibiotics with Keflex and rifampin  Continue indefinite Keflex and rifampin.  I have scheduled her to see me in 6 months time.       Follow Up Instructions:    I discussed the assessment and treatment plan with the patient. The patient was provided an opportunity to ask questions and all were answered. The patient agreed with the plan and demonstrated an understanding of the instructions.   The patient was advised to call back or seek an in-person evaluation if the symptoms worsen or if the condition fails to improve as anticipated.  I provided 21  minutes of non-face-to-face time during this encounter.   [name], MD

## 2020-10-12 ENCOUNTER — Encounter: Payer: Medicare Other | Admitting: Internal Medicine

## 2020-11-10 ENCOUNTER — Other Ambulatory Visit: Payer: Self-pay | Admitting: *Deleted

## 2020-11-10 DIAGNOSIS — Z95810 Presence of automatic (implantable) cardiac defibrillator: Secondary | ICD-10-CM

## 2020-11-10 DIAGNOSIS — I5022 Chronic systolic (congestive) heart failure: Secondary | ICD-10-CM

## 2020-11-10 MED ORDER — ENTRESTO 24-26 MG PO TABS: 1.0000 | ORAL_TABLET | Freq: Two times a day (BID) | ORAL | 2 refills | Status: DC

## 2020-11-10 MED ORDER — CARVEDILOL 12.5 MG PO TABS: 12.5000 mg | ORAL_TABLET | Freq: Two times a day (BID) | ORAL | 3 refills | Status: DC

## 2020-11-14 ENCOUNTER — Telehealth: Payer: Self-pay

## 2020-11-14 NOTE — Telephone Encounter (Signed)
Patient left voicemail stating she has been calling her pharmacy to arrange delivery of her cephalexin and rifampin. She is worried she may run out of her antibiotics soon.   RN called [facility] to see when medications would be delivered, they state they have no record of patient requesting a refill.   RN called patient, relayed that she will need to call [facility] and request a refill. Patient verbalized understanding and has no further questions.   Sandie Ano, RN

## 2020-11-20 ENCOUNTER — Ambulatory Visit (INDEPENDENT_AMBULATORY_CARE_PROVIDER_SITE_OTHER): Payer: Medicare Other

## 2020-11-20 DIAGNOSIS — I428 Other cardiomyopathies: Secondary | ICD-10-CM | POA: Diagnosis not present

## 2020-11-21 LAB — CUP PACEART REMOTE DEVICE CHECK
Battery Remaining Longevity: 72 mo
Battery Voltage: 2.96 V
Brady Statistic AP VP Percent: 15.09 %
Brady Statistic AP VS Percent: 0.09 %
Brady Statistic AS VP Percent: 81.46 %
Brady Statistic AS VS Percent: 3.35 %
Brady Statistic RA Percent Paced: 15.51 %
Brady Statistic RV Percent Paced: 32.02 %
Date Time Interrogation Session: 20221114001614
Implantable Lead Implant Date: 20190111
Implantable Lead Implant Date: 20190111
Implantable Lead Implant Date: 20190111
Implantable Lead Location: 753858
Implantable Lead Location: 753859
Implantable Lead Location: 753860
Implantable Lead Model: 5076
Implantable Lead Model: 5076
Implantable Pulse Generator Implant Date: 20190111
Lead Channel Impedance Value: 1501 Ohm
Lead Channel Impedance Value: 1539 Ohm
Lead Channel Impedance Value: 1558 Ohm
Lead Channel Impedance Value: 1596 Ohm
Lead Channel Impedance Value: 1653 Ohm
Lead Channel Impedance Value: 1710 Ohm
Lead Channel Impedance Value: 380 Ohm
Lead Channel Impedance Value: 399 Ohm
Lead Channel Impedance Value: 456 Ohm
Lead Channel Impedance Value: 494 Ohm
Lead Channel Impedance Value: 779 Ohm
Lead Channel Impedance Value: 836 Ohm
Lead Channel Impedance Value: 874 Ohm
Lead Channel Impedance Value: 931 Ohm
Lead Channel Pacing Threshold Amplitude: 0.5 V
Lead Channel Pacing Threshold Amplitude: 0.75 V
Lead Channel Pacing Threshold Amplitude: 2.375 V
Lead Channel Pacing Threshold Pulse Width: 0.4 ms
Lead Channel Pacing Threshold Pulse Width: 0.4 ms
Lead Channel Pacing Threshold Pulse Width: 1 ms
Lead Channel Sensing Intrinsic Amplitude: 1.625 mV
Lead Channel Sensing Intrinsic Amplitude: 1.625 mV
Lead Channel Sensing Intrinsic Amplitude: 10.375 mV
Lead Channel Sensing Intrinsic Amplitude: 10.375 mV
Lead Channel Setting Pacing Amplitude: 2 V
Lead Channel Setting Pacing Amplitude: 2.5 V
Lead Channel Setting Pacing Amplitude: 2.5 V
Lead Channel Setting Pacing Pulse Width: 0.4 ms
Lead Channel Setting Pacing Pulse Width: 1 ms
Lead Channel Setting Sensing Sensitivity: 0.9 mV

## 2020-11-22 ENCOUNTER — Other Ambulatory Visit: Payer: Self-pay

## 2020-11-22 DIAGNOSIS — T827XXA Infection and inflammatory reaction due to other cardiac and vascular devices, implants and grafts, initial encounter: Secondary | ICD-10-CM

## 2020-11-22 DIAGNOSIS — T8450XS Infection and inflammatory reaction due to unspecified internal joint prosthesis, sequela: Secondary | ICD-10-CM

## 2020-11-22 DIAGNOSIS — T8453XD Infection and inflammatory reaction due to internal right knee prosthesis, subsequent encounter: Secondary | ICD-10-CM

## 2020-11-22 MED ORDER — CEPHALEXIN 500 MG PO CAPS: 1000.0000 mg | ORAL_CAPSULE | Freq: Two times a day (BID) | ORAL | 0 refills | Status: DC

## 2020-11-22 MED ORDER — RIFAMPIN 300 MG PO CAPS: ORAL_CAPSULE | ORAL | 0 refills | Status: DC

## 2020-11-22 NOTE — Telephone Encounter (Signed)
Patient's daughter called office to follow up about medication delivery. RN offered to send 1 month supply to their [facility] but advised that the Rifampin might not be readily available due to previously reported shortages. RN sent 1 month supply of both and got the out of pocket costs for both meds ($142). RN called patient back to inform her that the medication will be available today for pick up and advised that she'll have leftovers of this refill to bridge her if there are any delays moving forward.   Adeeb Konecny Loyola Mast, RN

## 2020-11-22 NOTE — Telephone Encounter (Signed)
RN called [facility] who reports that the medication was sent yesterday in standard shipping. Stated it would be delivered in a few days. RN called patient back to update her and explained that the medication is not on automatic refill (neither are eligible for it) and she will need to contact them every month to set up delivery. She stated she couldn't get through and RN explained she needs to follow the prompts in order to speak with someone in customer service.   Patient asked if it was ok to go a few days without her medication; RN reassured her that it was and as soon as she gets it she can restart it.   Nuriyah Hanline Loyola Mast, RN

## 2020-11-22 NOTE — Telephone Encounter (Signed)
Patient called to state that she still has not received refills on her antibiotics. States that they delivered 3 medications that she did not request refills for. RN advised that she needs to follow up with them about the antibiotics as instructed to set up delivery as our office cannot arrange that. RN provided their number and instructed her to call office if additional difficulty setting up delivery occurs.   Gaudencio Chesnut Loyola Mast, RN

## 2020-11-28 NOTE — Progress Notes (Signed)
Remote pacemaker transmission.   

## 2021-01-12 ENCOUNTER — Other Ambulatory Visit: Payer: Self-pay

## 2021-01-12 DIAGNOSIS — T8450XS Infection and inflammatory reaction due to unspecified internal joint prosthesis, sequela: Secondary | ICD-10-CM

## 2021-01-12 DIAGNOSIS — T8453XD Infection and inflammatory reaction due to internal right knee prosthesis, subsequent encounter: Secondary | ICD-10-CM

## 2021-01-12 DIAGNOSIS — T827XXA Infection and inflammatory reaction due to other cardiac and vascular devices, implants and grafts, initial encounter: Secondary | ICD-10-CM

## 2021-01-12 MED ORDER — CEPHALEXIN 500 MG PO CAPS: 1000.0000 mg | ORAL_CAPSULE | Freq: Two times a day (BID) | ORAL | 3 refills | Status: DC

## 2021-01-12 MED ORDER — RIFAMPIN 300 MG PO CAPS: ORAL_CAPSULE | ORAL | 3 refills | Status: DC

## 2021-02-06 ENCOUNTER — Ambulatory Visit: Payer: Medicare Other | Admitting: Cardiology

## 2021-02-19 ENCOUNTER — Ambulatory Visit (INDEPENDENT_AMBULATORY_CARE_PROVIDER_SITE_OTHER): Payer: Medicare Other

## 2021-02-19 DIAGNOSIS — I428 Other cardiomyopathies: Secondary | ICD-10-CM

## 2021-02-21 LAB — CUP PACEART REMOTE DEVICE CHECK
Battery Remaining Longevity: 66 mo
Battery Voltage: 2.96 V
Brady Statistic AP VP Percent: 16.18 %
Brady Statistic AP VS Percent: 0.08 %
Brady Statistic AS VP Percent: 81.56 %
Brady Statistic AS VS Percent: 2.17 %
Brady Statistic RA Percent Paced: 16.44 %
Brady Statistic RV Percent Paced: 33.6 %
Date Time Interrogation Session: 20230215000010
Implantable Lead Implant Date: 20190111
Implantable Lead Implant Date: 20190111
Implantable Lead Implant Date: 20190111
Implantable Lead Location: 753858
Implantable Lead Location: 753859
Implantable Lead Location: 753860
Implantable Lead Model: 5076
Implantable Lead Model: 5076
Implantable Pulse Generator Implant Date: 20190111
Lead Channel Impedance Value: 1463 Ohm
Lead Channel Impedance Value: 1482 Ohm
Lead Channel Impedance Value: 1482 Ohm
Lead Channel Impedance Value: 1577 Ohm
Lead Channel Impedance Value: 1577 Ohm
Lead Channel Impedance Value: 1615 Ohm
Lead Channel Impedance Value: 361 Ohm
Lead Channel Impedance Value: 418 Ohm
Lead Channel Impedance Value: 418 Ohm
Lead Channel Impedance Value: 551 Ohm
Lead Channel Impedance Value: 760 Ohm
Lead Channel Impedance Value: 836 Ohm
Lead Channel Impedance Value: 855 Ohm
Lead Channel Impedance Value: 874 Ohm
Lead Channel Pacing Threshold Amplitude: 0.5 V
Lead Channel Pacing Threshold Amplitude: 0.75 V
Lead Channel Pacing Threshold Amplitude: 2.375 V
Lead Channel Pacing Threshold Pulse Width: 0.4 ms
Lead Channel Pacing Threshold Pulse Width: 0.4 ms
Lead Channel Pacing Threshold Pulse Width: 1 ms
Lead Channel Sensing Intrinsic Amplitude: 1.75 mV
Lead Channel Sensing Intrinsic Amplitude: 1.75 mV
Lead Channel Sensing Intrinsic Amplitude: 10.125 mV
Lead Channel Sensing Intrinsic Amplitude: 10.125 mV
Lead Channel Setting Pacing Amplitude: 2 V
Lead Channel Setting Pacing Amplitude: 2.5 V
Lead Channel Setting Pacing Amplitude: 2.5 V
Lead Channel Setting Pacing Pulse Width: 0.4 ms
Lead Channel Setting Pacing Pulse Width: 1 ms
Lead Channel Setting Sensing Sensitivity: 0.9 mV

## 2021-02-23 NOTE — Progress Notes (Signed)
Remote pacemaker transmission.   

## 2021-03-06 ENCOUNTER — Ambulatory Visit (INDEPENDENT_AMBULATORY_CARE_PROVIDER_SITE_OTHER): Payer: Medicare Other

## 2021-03-06 ENCOUNTER — Telehealth: Payer: Self-pay

## 2021-03-06 DIAGNOSIS — I428 Other cardiomyopathies: Secondary | ICD-10-CM | POA: Diagnosis not present

## 2021-03-06 NOTE — Telephone Encounter (Signed)
Scheduled 1 wk follow up transmission for further review of LV thresholds. Presenting rhythm AsBiV pacing with LV automatic pacing threshold at 2.5v/1.19ms. Routing for further review.  Will forward to Dr. Graciela Husbands for review and recommendations if any.

## 2021-03-07 LAB — CUP PACEART REMOTE DEVICE CHECK
Battery Remaining Longevity: 65 mo
Battery Voltage: 2.96 V
Brady Statistic AP VP Percent: 11.32 %
Brady Statistic AP VS Percent: 0.07 %
Brady Statistic AS VP Percent: 87.25 %
Brady Statistic AS VS Percent: 1.36 %
Brady Statistic RA Percent Paced: 11.36 %
Brady Statistic RV Percent Paced: 27.16 %
Date Time Interrogation Session: 20230228001532
Implantable Lead Implant Date: 20190111
Implantable Lead Implant Date: 20190111
Implantable Lead Implant Date: 20190111
Implantable Lead Location: 753858
Implantable Lead Location: 753859
Implantable Lead Location: 753860
Implantable Lead Model: 5076
Implantable Lead Model: 5076
Implantable Pulse Generator Implant Date: 20190111
Lead Channel Impedance Value: 1406 Ohm
Lead Channel Impedance Value: 1425 Ohm
Lead Channel Impedance Value: 1463 Ohm
Lead Channel Impedance Value: 1482 Ohm
Lead Channel Impedance Value: 1558 Ohm
Lead Channel Impedance Value: 1596 Ohm
Lead Channel Impedance Value: 361 Ohm
Lead Channel Impedance Value: 399 Ohm
Lead Channel Impedance Value: 418 Ohm
Lead Channel Impedance Value: 494 Ohm
Lead Channel Impedance Value: 722 Ohm
Lead Channel Impedance Value: 798 Ohm
Lead Channel Impedance Value: 817 Ohm
Lead Channel Impedance Value: 874 Ohm
Lead Channel Pacing Threshold Amplitude: 0.375 V
Lead Channel Pacing Threshold Amplitude: 0.75 V
Lead Channel Pacing Threshold Amplitude: 2.5 V
Lead Channel Pacing Threshold Pulse Width: 0.4 ms
Lead Channel Pacing Threshold Pulse Width: 0.4 ms
Lead Channel Pacing Threshold Pulse Width: 1 ms
Lead Channel Sensing Intrinsic Amplitude: 1.75 mV
Lead Channel Sensing Intrinsic Amplitude: 1.75 mV
Lead Channel Sensing Intrinsic Amplitude: 10.75 mV
Lead Channel Sensing Intrinsic Amplitude: 10.75 mV
Lead Channel Setting Pacing Amplitude: 2 V
Lead Channel Setting Pacing Amplitude: 2.5 V
Lead Channel Setting Pacing Amplitude: 2.5 V
Lead Channel Setting Pacing Pulse Width: 0.4 ms
Lead Channel Setting Pacing Pulse Width: 1 ms
Lead Channel Setting Sensing Sensitivity: 0.9 mV

## 2021-03-14 ENCOUNTER — Ambulatory Visit: Payer: Medicare Other | Admitting: Infectious Disease

## 2021-03-14 NOTE — Progress Notes (Signed)
Remote pacemaker transmission.   

## 2021-03-20 NOTE — Telephone Encounter (Signed)
We should have her come in and program 0.25 v above threshold  ?

## 2021-03-21 NOTE — Telephone Encounter (Signed)
Successful telephone encounter to patient to advise of needed device clinic appointment per Dr. Caryl Comes to reprogram LV to 0.25V above threshold. Patient states she recently lost her husband and assisting her daughter with settling his affairs and reliant on daughter for transportation. Patient has appointment with Dr. Johney Frame 04/13/21. Patient request device clinic appointment for device reprogramming on same day if possible. Appointment made for 04/13/21 at 11:00. Will discuss with Dr. Caryl Comes and if appointment is needed sooner will advise patient and reschedule.  ?

## 2021-04-10 NOTE — Progress Notes (Deleted)
?Cardiology Office Note:   ? ?Date:  04/10/2021  ? ?[name] [name], DOB 02/18/1935, MRN [id number] ? ?PCP:  Leonard Downing, MD ?  ?[facility] Providers ?Cardiologist:  Ena Dawley, MD ?Electrophysiologist:  Virl Axe, MD { ?  ? ?Referring MD: Leonard Downing, *  ? ? ? ?History of Present Illness:   ? ?[name] [name] is a 86 y.o. female with a hx of NICM, chronic combined CHF, LBBB, nonsustained VT, prior CRT-D insertion c/b vegetations with subsequent extraction and implantation of CRT-P in 01/2017, HTN, pancreatitis, prior GIB, asthma, anemia, GERD, hypothyroidism, prior issues with bacteremia who presents to clinic for follow-up. ? ?Per review of the record, cardiac cath 2015 showed normal coronaries. She previously underwent BiV ICD insertion in 2016. She had staph bacteremia in 2018 in the wake of orthopedic surgery and TEE confirmed vegetation on RA lead. She was transferred to [facility] for extraction. Because of interval subsequent worsening LV function and functional status, she underwent CRT reimplantation 01/2017 and it was felt she would require long term abx. Per operative report it appears this was a CRT-P on this insertion, not CRT-D. This is followed by Dr. Caryl Comes. ?  ?She was again admitted 12/2019 at [facility] for sepsis, bacteremia and right knee prosthetic joint infection s/p I&D. She unfortunately had run out of her antibiotics for about 2 weeks prior to this due to an issue with her mail order pharmacy. Repeat 2D echo showed EF >55% (previously 30-35% by our limited echo 02/2019), moderate MAC, mild MR, normal RV, mild pulm HTN. TEE reportedly showed possible infection of right atrial ICD lead versus fibrinous material. Per notes, cardiology recommended maintaining current device with supportive therapy and she was discharged on antibiotics. She has since followed with ID as well. ? ?Was last seen by Dr. Caryl Comes in clinic in 07/2020 where she was doing well.  ? ?Today, *** ? ?Past  Medical History:  ?Diagnosis Date  ? 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 "was in remission for 5 years the 1st time; ~ 7 years the second time" (06/21/2016)  ? Acute lower GI bleeding 05/06/2017  ? Anemia 05/06/2017  ? Arthritis   ? "finger joints" (06/21/2016)  ? Asthma   ? Chronic combined systolic (congestive) and diastolic (congestive) heart failure (HCC)   ? Congestive dilated cardiomyopathy ([place]) 03/31/2014  ? a. s/p Biotronik CRTD 06/2014  ? GERD (gastroesophageal reflux disease)   ? Headache   ? History of blood transfusion 1990s  ? "related to hysterectomy"  ? Hypothyroidism   ? Infection of pacemaker lead wire ([place])   ? Left bundle branch block   ? MSSA bacteremia 06/21/2016  ? Myocardial infarction [facility])   ? NICM (nonischemic cardiomyopathy) ([name])   ? Pancreatitis, acute   ? ? ?Past Surgical History:  ?Procedure Laterality Date  ? ABDOMINAL HYSTERECTOMY    ? APPENDECTOMY    ? BIV PACEMAKER INSERTION CRT-P N/A 01/17/2017  ? Procedure: BIV PACEMAKER INSERTION CRT-P;  Surgeon: Deboraha Sprang, MD;  Location: [place] CV LAB;  Service: Cardiovascular;  Laterality: N/A;  ? CARDIAC CATHETERIZATION    ? CARDIAC DEFIBRILLATOR PLACEMENT  ?2016  ? CATARACT EXTRACTION W/ INTRAOCULAR LENS  IMPLANT, BILATERAL Bilateral   ? CHOLECYSTECTOMY OPEN    ? "related to pancreatitis"  ? DILATION AND CURETTAGE OF UTERUS    ? EP IMPLANTABLE DEVICE N/A 07/04/2014  ? Procedure: BiV ICD Insertion CRT-D;  Surgeon: Deboraha Sprang, MD;  Location: [facility]  INVASIVE CV LAB;  Service: Cardiovascular;  Laterality: N/A;  ? ESOPHAGOGASTRODUODENOSCOPY (EGD) WITH PROPOFOL N/A 05/08/2017  ? Procedure: ESOPHAGOGASTRODUODENOSCOPY (EGD) WITH PROPOFOL;  Surgeon: Wonda Horner, MD;  Location: [facility] ENDOSCOPY;  Service: Endoscopy;  Laterality: N/A;  ? HERNIA REPAIR    ? I & D KNEE WITH POLY EXCHANGE Right 08/11/2016  ? Procedure: IRRIGATION AND DEBRIDEMENT KNEE WITH POLY EXCHANGE;  Surgeon: Rod Can, MD;  Location: [place];  Service: Orthopedics;   Laterality: Right;  ? INSERT / REPLACE / REMOVE PACEMAKER  ?2016  ? JOINT REPLACEMENT Bilateral   ? "thumb joints; used my own material"  ? JOINT REPLACEMENT    ? LEFT HEART CATHETERIZATION WITH CORONARY ANGIOGRAM N/A 11/02/2013  ? Procedure: LEFT HEART CATHETERIZATION WITH CORONARY ANGIOGRAM;  Surgeon: Jettie Booze, MD;  Location: [facility] CATH LAB;  Service: Cardiovascular;  Laterality: N/A;  ? LOOP RECORDER IMPLANT N/A 04/13/2014  ? Procedure: LOOP RECORDER IMPLANT;  Surgeon: Deboraha Sprang, MD;  Location: [facility] CATH LAB;  Service: Cardiovascular;  Laterality: N/A;  ? LOOP RECORDER REMOVAL  ?2016  ? POSTERIOR LUMBAR FUSION  05/03/2016  ? Lumbar Two-Three, Lumbar Three-Four Posterior Lumbar Fusion withLaminectomy and Foraminotomy  ? SPINE SURGERY  04/2016  ? lumbar   ? TEE WITHOUT CARDIOVERSION N/A 08/14/2016  ? Procedure: TRANSESOPHAGEAL ECHOCARDIOGRAM (TEE);  Surgeon: Dorothy Spark, MD;  Location: [place];  Service: Cardiovascular;  Laterality: N/A;  ? TONSILLECTOMY    ? TOTAL HIP ARTHROPLASTY Bilateral   ? UMBILICAL HERNIA REPAIR  1970s?  ? ? ?Current Medications: ?No outpatient medications have been marked as taking for the 04/13/21 encounter (Appointment) with Freada Bergeron, MD.  ?  ? ?Allergies:   Sulfa antibiotics  ? ?Social History  ? ?Socioeconomic History  ? Marital status: Married  ?  Spouse name: Not on file  ? Number of children: Not on file  ? Years of education: Not on file  ? Highest education level: Not on file  ?Occupational History  ? Not on file  ?Tobacco Use  ? Smoking status: Never  ? Smokeless tobacco: Never  ? Tobacco comments:  ?  "smoked as a teenager; for maybe 1 wk"  ?Vaping Use  ? Vaping Use: Never used  ?Substance and Sexual Activity  ? Alcohol use: Not Currently  ?  Comment: 6/15//2018 "glass of wine q 6 months or so"  ? Drug use: No  ? Sexual activity: Not on file  ?Other Topics Concern  ? Not on file  ?Social History Narrative  ? Not on file  ? ?Social Determinants of  Health  ? ?Financial Resource Strain: Not on file  ?Food Insecurity: Not on file  ?Transportation Needs: Not on file  ?Physical Activity: Not on file  ?Stress: Not on file  ?Social Connections: Not on file  ?  ? ?Family History: ?The patient's ***family history includes Arrhythmia in her mother; Heart attack in her father, maternal grandfather, and paternal grandfather; Heart disease in her father; Hyperlipidemia in her mother. ? ?ROS:   ?Please see the history of present illness.    ?*** All other systems reviewed and are negative. ? ?EKGs/Labs/Other Studies Reviewed:   ? ?The following studies were reviewed today: ?2D echo CareEverywhere 12/2019 ? ?Summary  ?  1. The left ventricle is normal in size with normal wall thickness.  ?  2. The left ventricular systolic function is normal, LVEF is visually  ?estimated at > 55%.  ?  3. The mitral valve leaflets  are mildly thickened with normal leaflet  ?mobility.  ?  4. Mitral annular calcification is present (moderate).  ?  5. There is mild mitral valve regurgitation.  ?  6. The aortic valve is probably trileaflet with mildly thickened leaflets  ?with normal excursion.  ?  7. The right ventricle is normal in size, with normal systolic function.  ?  8. There is mild pulmonary hypertension, estimated pulmonary artery systolic  ?pressure is 39 mmHg.  ?  9. IVC size and inspiratory change suggest mildly elevated right atrial  ?pressure. (5-10 mmHg).  ?  ?TEE CareEverywhere 12/2019 ?Summary  ?  1. Mobile, linear echos associated with device lead, cannot exclude  ?infectious vegetation (see detail below).  ?  2. No apparent valvular vegetation visualized.  ?  3. There is mild mitral valve regurgitation.  ?  4. Aortic sclerosis.  ?  5. There is mild aortic regurgitation.  ?  ?2D echo limited 02/2019 ? ? 1. Moderate to severe global reduction in LV systolic function; mild LVH;  ?grade 1 diastolic dysfunction; mild AI.  ? 2. Left ventricular ejection fraction, by estimation, is 30  to 35%. The  ?left ventricle has moderate to severely decreased function. The left  ?ventrical demonstrates global hypokinesis. There is mildly increased left  ?ventricular hypertrophy. Left  ?ventricular diasto

## 2021-04-13 ENCOUNTER — Encounter: Payer: Self-pay | Admitting: Cardiology

## 2021-04-13 ENCOUNTER — Encounter (INDEPENDENT_AMBULATORY_CARE_PROVIDER_SITE_OTHER): Payer: Self-pay

## 2021-04-13 ENCOUNTER — Ambulatory Visit (INDEPENDENT_AMBULATORY_CARE_PROVIDER_SITE_OTHER): Payer: Medicare Other | Admitting: Cardiology

## 2021-04-13 ENCOUNTER — Ambulatory Visit (INDEPENDENT_AMBULATORY_CARE_PROVIDER_SITE_OTHER): Payer: Medicare Other

## 2021-04-13 VITALS — BP 110/68 | HR 64 | Ht 62.0 in | Wt 140.8 lb

## 2021-04-13 DIAGNOSIS — I5022 Chronic systolic (congestive) heart failure: Secondary | ICD-10-CM | POA: Diagnosis not present

## 2021-04-13 DIAGNOSIS — Z95810 Presence of automatic (implantable) cardiac defibrillator: Secondary | ICD-10-CM

## 2021-04-13 DIAGNOSIS — T827XXD Infection and inflammatory reaction due to other cardiac and vascular devices, implants and grafts, subsequent encounter: Secondary | ICD-10-CM | POA: Diagnosis not present

## 2021-04-13 DIAGNOSIS — I447 Left bundle-branch block, unspecified: Secondary | ICD-10-CM | POA: Diagnosis not present

## 2021-04-13 DIAGNOSIS — T827XXA Infection and inflammatory reaction due to other cardiac and vascular devices, implants and grafts, initial encounter: Secondary | ICD-10-CM

## 2021-04-13 DIAGNOSIS — I428 Other cardiomyopathies: Secondary | ICD-10-CM

## 2021-04-13 LAB — CUP PACEART INCLINIC DEVICE CHECK
Battery Remaining Longevity: 61 mo
Battery Voltage: 2.96 V
Brady Statistic AP VP Percent: 15.6 %
Brady Statistic AP VS Percent: 0.09 %
Brady Statistic AS VP Percent: 81.81 %
Brady Statistic AS VS Percent: 2.5 %
Brady Statistic RA Percent Paced: 15.91 %
Brady Statistic RV Percent Paced: 32.21 %
Date Time Interrogation Session: 20230407115458
Implantable Lead Implant Date: 20190111
Implantable Lead Implant Date: 20190111
Implantable Lead Implant Date: 20190111
Implantable Lead Location: 753858
Implantable Lead Location: 753859
Implantable Lead Location: 753860
Implantable Lead Model: 5076
Implantable Lead Model: 5076
Implantable Pulse Generator Implant Date: 20190111
Lead Channel Impedance Value: 1501 Ohm
Lead Channel Impedance Value: 1501 Ohm
Lead Channel Impedance Value: 1520 Ohm
Lead Channel Impedance Value: 1558 Ohm
Lead Channel Impedance Value: 1596 Ohm
Lead Channel Impedance Value: 1634 Ohm
Lead Channel Impedance Value: 361 Ohm
Lead Channel Impedance Value: 399 Ohm
Lead Channel Impedance Value: 418 Ohm
Lead Channel Impedance Value: 494 Ohm
Lead Channel Impedance Value: 741 Ohm
Lead Channel Impedance Value: 855 Ohm
Lead Channel Impedance Value: 855 Ohm
Lead Channel Impedance Value: 874 Ohm
Lead Channel Pacing Threshold Amplitude: 0.375 V
Lead Channel Pacing Threshold Amplitude: 0.75 V
Lead Channel Pacing Threshold Amplitude: 2.25 V
Lead Channel Pacing Threshold Pulse Width: 0.4 ms
Lead Channel Pacing Threshold Pulse Width: 0.4 ms
Lead Channel Pacing Threshold Pulse Width: 1 ms
Lead Channel Sensing Intrinsic Amplitude: 1.75 mV
Lead Channel Sensing Intrinsic Amplitude: 1.75 mV
Lead Channel Sensing Intrinsic Amplitude: 16 mV
Lead Channel Sensing Intrinsic Amplitude: 16 mV
Lead Channel Setting Pacing Amplitude: 2 V
Lead Channel Setting Pacing Amplitude: 2.5 V
Lead Channel Setting Pacing Amplitude: 2.75 V
Lead Channel Setting Pacing Pulse Width: 0.4 ms
Lead Channel Setting Pacing Pulse Width: 1 ms
Lead Channel Setting Sensing Sensitivity: 0.9 mV

## 2021-04-13 NOTE — Progress Notes (Signed)
?Cardiology Office Note:   ? ?Date:  04/13/2021  ? ?[name] [name], DOB Nov 17, 1935, MRN [id number] ? ?PCP:  Leonard Downing, MD ?  ?[place] Providers ?Cardiologist:  Ena Dawley, MD ?Electrophysiologist:  Virl Axe, MD  ?{ ?  ? ?Referring MD: Leonard Downing, *  ? ? ? ?History of Present Illness:   ? ?[name] [name] is a 86 y.o. female with a hx of NICM, chronic combined CHF, LBBB, nonsustained VT, prior CRT-D insertion c/b vegetations with subsequent extraction and implantation of CRT-P in 01/2017, HTN, pancreatitis, prior GIB, asthma, anemia, GERD, hypothyroidism, prior issues with bacteremia who presents to clinic for follow-up. ? ?Per review of the record, cardiac cath 2015 showed normal coronaries. She previously underwent BiV ICD insertion in 2016. She had staph bacteremia in 2018 in the wake of orthopedic surgery and TEE confirmed vegetation on RA lead. She was transferred to [facility] for extraction. Because of interval subsequent worsening LV function and functional status, she underwent CRT reimplantation 01/2017 and it was felt she would require long term abx. Per operative report it appears this was a CRT-P on this insertion, not CRT-D. This is followed by Dr. Caryl Comes. ?  ?She was again admitted 12/2019 at [facility] for sepsis, bacteremia and right knee prosthetic joint infection s/p I&D. She unfortunately had run out of her antibiotics for about 2 weeks prior to this due to an issue with her mail order pharmacy. Repeat 2D echo showed EF >55% (previously 30-35% by our limited echo 02/2019), moderate MAC, mild MR, normal RV, mild pulm HTN. TEE reportedly showed possible infection of right atrial ICD lead versus fibrinous material. Per notes, cardiology recommended maintaining current device with supportive therapy and she was discharged on antibiotics. She has since followed with ID as well. ? ?Was last seen by Dr. Caryl Comes in clinic in 07/2020 where she was doing well.  ? ?Today, she reports  she is doing well. She is accompanied by her daughter. ?She is staying active and while mowing she had no exertional symptoms. She notes shortness of breath during the day but it might be due to asthma and allergies. Very mild dyspnea on exertion. ?She sleeps with 2 pillows which helps to manage her breathing. This has been ongoing for about 1 year. Recent impedences on device have been normal. Has not been needing her lasix. No significant LE edema. ?She denies dizziness and lightheadedness while sitting but mentions an episode where she stood up too fast and felt dizzy for a second. ?She is planned to see device clinic after this visit.  ? ?She denies chest pain, palpitations, lightheadedness, headaches, syncope, LE edema, orthopnea, PND.  ? ?Past Medical History:  ?Diagnosis Date  ? 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 "was in remission for 5 years the 1st time; ~ 7 years the second time" (06/21/2016)  ? Acute lower GI bleeding 05/06/2017  ? Anemia 05/06/2017  ? Arthritis   ? "finger joints" (06/21/2016)  ? Asthma   ? Chronic combined systolic (congestive) and diastolic (congestive) heart failure (HCC)   ? Congestive dilated cardiomyopathy ([place]) 03/31/2014  ? a. s/p Biotronik CRTD 06/2014  ? GERD (gastroesophageal reflux disease)   ? Headache   ? History of blood transfusion 1990s  ? "related to hysterectomy"  ? Hypothyroidism   ? Infection of pacemaker lead wire ([place])   ? Left bundle branch block   ? MSSA bacteremia 06/21/2016  ? Myocardial infarction [facility])   ? NICM (nonischemic  cardiomyopathy) ([place])   ? Pancreatitis, acute   ? ? ?Past Surgical History:  ?Procedure Laterality Date  ? ABDOMINAL HYSTERECTOMY    ? APPENDECTOMY    ? BIV PACEMAKER INSERTION CRT-P N/A 01/17/2017  ? Procedure: BIV PACEMAKER INSERTION CRT-P;  Surgeon: Deboraha Sprang, MD;  Location: [place] CV LAB;  Service: Cardiovascular;  Laterality: N/A;  ? CARDIAC CATHETERIZATION    ? CARDIAC DEFIBRILLATOR PLACEMENT  ?2016  ? CATARACT  EXTRACTION W/ INTRAOCULAR LENS  IMPLANT, BILATERAL Bilateral   ? CHOLECYSTECTOMY OPEN    ? "related to pancreatitis"  ? DILATION AND CURETTAGE OF UTERUS    ? EP IMPLANTABLE DEVICE N/A 07/04/2014  ? Procedure: BiV ICD Insertion CRT-D;  Surgeon: Deboraha Sprang, MD;  Location: [place] CV LAB;  Service: Cardiovascular;  Laterality: N/A;  ? ESOPHAGOGASTRODUODENOSCOPY (EGD) WITH PROPOFOL N/A 05/08/2017  ? Procedure: ESOPHAGOGASTRODUODENOSCOPY (EGD) WITH PROPOFOL;  Surgeon: Wonda Horner, MD;  Location: [facility] ENDOSCOPY;  Service: Endoscopy;  Laterality: N/A;  ? HERNIA REPAIR    ? I & D KNEE WITH POLY EXCHANGE Right 08/11/2016  ? Procedure: IRRIGATION AND DEBRIDEMENT KNEE WITH POLY EXCHANGE;  Surgeon: Rod Can, MD;  Location: [place];  Service: Orthopedics;  Laterality: Right;  ? INSERT / REPLACE / REMOVE PACEMAKER  ?2016  ? JOINT REPLACEMENT Bilateral   ? "thumb joints; used my own material"  ? JOINT REPLACEMENT    ? LEFT HEART CATHETERIZATION WITH CORONARY ANGIOGRAM N/A 11/02/2013  ? Procedure: LEFT HEART CATHETERIZATION WITH CORONARY ANGIOGRAM;  Surgeon: Jettie Booze, MD;  Location: [facility] CATH LAB;  Service: Cardiovascular;  Laterality: N/A;  ? LOOP RECORDER IMPLANT N/A 04/13/2014  ? Procedure: LOOP RECORDER IMPLANT;  Surgeon: Deboraha Sprang, MD;  Location: [facility] CATH LAB;  Service: Cardiovascular;  Laterality: N/A;  ? LOOP RECORDER REMOVAL  ?2016  ? POSTERIOR LUMBAR FUSION  05/03/2016  ? Lumbar Two-Three, Lumbar Three-Four Posterior Lumbar Fusion withLaminectomy and Foraminotomy  ? SPINE SURGERY  04/2016  ? lumbar   ? TEE WITHOUT CARDIOVERSION N/A 08/14/2016  ? Procedure: TRANSESOPHAGEAL ECHOCARDIOGRAM (TEE);  Surgeon: Dorothy Spark, MD;  Location: [place];  Service: Cardiovascular;  Laterality: N/A;  ? TONSILLECTOMY    ? TOTAL HIP ARTHROPLASTY Bilateral   ? UMBILICAL HERNIA REPAIR  1970s?  ? ? ?Current Medications: ?Current Meds  ?Medication Sig  ? acetaminophen (TYLENOL) 325 MG tablet Take 650 mg by mouth  every 6 (six) hours as needed.  ? albuterol (VENTOLIN HFA) 108 (90 Base) MCG/ACT inhaler Inhale 2 puffs into the lungs every 6 (six) hours as needed for wheezing or shortness of breath.  ? Aspirin-Acetaminophen-Caffeine (EXCEDRIN PO) Take 1 tablet by mouth as needed.  ? betamethasone dipropionate (DIPROLENE) 0.05 % ointment Apply 1 application topically daily as needed.  ? Calcium Carbonate Antacid (TUMS PO) Take 1 tablet by mouth daily as needed (indigestion).  ? carvedilol (COREG) 12.5 MG tablet Take 1 tablet (12.5 mg total) by mouth 2 (two) times daily.  ? cephALEXin (KEFLEX) 500 MG capsule Take 2 capsules (1,000 mg total) by mouth 2 (two) times daily.  ? famotidine (PEPCID) 20 MG tablet Take 20 mg by mouth 2 (two) times daily as needed.  ? feeding supplement, ENSURE ENLIVE, (ENSURE ENLIVE) LIQD Take 237 mLs by mouth 2 (two) times daily between meals.  ? Fluticasone-Salmeterol (ADVAIR) 250-50 MCG/DOSE AEPB Inhale 1 puff into the lungs 2 (two) times daily.  ? furosemide (LASIX) 40 MG tablet TAKE 1 TABLET DAILY (DOSE INCREASED)  ?  levothyroxine (SYNTHROID) 175 MCG tablet Take 1 tablet (175 mcg total) by mouth daily before breakfast.  ? Melatonin 3 MG TABS Take 3 mg by mouth at bedtime as needed (for sleep.).   ? methocarbamol (ROBAXIN) 500 MG tablet Take 1 tablet (500 mg total) by mouth every 6 (six) hours as needed for muscle spasms.  ? methylphenidate (RITALIN) 20 MG tablet Take 40 mg by mouth daily.  ? Naphazoline-Pheniramine (ALLERGY EYE OP) Place 1-2 drops into both eyes daily as needed (for irritated eyes).  ? naproxen (NAPROSYN) 500 MG tablet Take 500 mg by mouth 2 (two) times daily as needed.  ? ondansetron (ZOFRAN) 8 MG tablet Take 8 mg by mouth as needed for nausea or vomiting.  ? pantoprazole (PROTONIX) 40 MG tablet Take 40 mg by mouth daily.  ? polyethylene glycol (MIRALAX / GLYCOLAX) packet Take 17 g by mouth daily as needed for mild constipation.  ? rifampin (RIFADIN) 300 MG capsule TAKE 1 CAPSULE BY  MOUTH EVERY 12 HOURS  ? sacubitril-valsartan (ENTRESTO) 24-26 MG Take 1 tablet by mouth 2 (two) times daily.  ? sucralfate (CARAFATE) 1 g tablet Take 1 g by mouth daily.  ?  ? ?Allergies:   Sulfa antibiotics  ? ?

## 2021-04-13 NOTE — Patient Instructions (Signed)
Medication Instructions:  ? ?Your physician recommends that you continue on your current medications as directed. Please refer to the Current Medication list given to you today. ? ?*If you need a refill on your cardiac medications before your next appointment, please call your pharmacy* ? ? ?Testing/Procedures: ? ?Your physician has requested that you have an echocardiogram. Echocardiography is a painless test that uses sound waves to create images of your heart. It provides your doctor with information about the size and shape of your heart and how well your heart?s chambers and valves are working. This procedure takes approximately one hour. There are no restrictions for this procedure. ? ? ?Follow-Up: ?At [facility], you and your health needs are our priority.  As part of our continuing mission to provide you with exceptional heart care, we have created designated Provider Care Teams.  These Care Teams include your primary Cardiologist (physician) and Advanced Practice Providers (APPs -  Physician Assistants and Nurse Practitioners) who all work together to provide you with the care you need, when you need it. ? ?We recommend signing up for the patient portal called "MyChart".  Sign up information is provided on this After Visit Summary.  MyChart is used to connect with patients for Virtual Visits (Telemedicine).  Patients are able to view lab/test results, encounter notes, upcoming appointments, etc.  Non-urgent messages can be sent to your provider as well.   ?To learn more about what you can do with MyChart, go to [email or web address].   ? ?Your next appointment:   ?6 month(s) ? ?The format for your next appointment:   ?In Person ? ?Provider:   ?Dr. Shari Prows  ? ?

## 2021-04-13 NOTE — Progress Notes (Signed)
CRTP check to increase LV threshold per SK. LV Threshold changed from 2.5V@1 .45ms to 2.75V@1 .56ms. ?

## 2021-04-30 ENCOUNTER — Other Ambulatory Visit: Payer: Self-pay

## 2021-04-30 ENCOUNTER — Telehealth: Payer: Self-pay

## 2021-04-30 ENCOUNTER — Other Ambulatory Visit (HOSPITAL_COMMUNITY): Payer: Self-pay

## 2021-04-30 DIAGNOSIS — T8453XD Infection and inflammatory reaction due to internal right knee prosthesis, subsequent encounter: Secondary | ICD-10-CM

## 2021-04-30 DIAGNOSIS — T827XXA Infection and inflammatory reaction due to other cardiac and vascular devices, implants and grafts, initial encounter: Secondary | ICD-10-CM

## 2021-04-30 DIAGNOSIS — T8450XS Infection and inflammatory reaction due to unspecified internal joint prosthesis, sequela: Secondary | ICD-10-CM

## 2021-04-30 MED ORDER — CEPHALEXIN 500 MG PO CAPS: 1000.0000 mg | ORAL_CAPSULE | Freq: Two times a day (BID) | ORAL | 0 refills | Status: DC

## 2021-04-30 MED ORDER — RIFAMPIN 300 MG PO CAPS: ORAL_CAPSULE | ORAL | 0 refills | Status: DC

## 2021-04-30 NOTE — Telephone Encounter (Signed)
Patient requesting refills on Cephalexin and Rifampin. Patient missed appointment in March. Patient husband recently passed away - will call back in a couple weeks to reschedule missed appointment.  ?

## 2021-05-01 ENCOUNTER — Ambulatory Visit (HOSPITAL_COMMUNITY): Payer: Medicare Other | Attending: Cardiology

## 2021-05-01 DIAGNOSIS — I428 Other cardiomyopathies: Secondary | ICD-10-CM | POA: Insufficient documentation

## 2021-05-01 DIAGNOSIS — I5022 Chronic systolic (congestive) heart failure: Secondary | ICD-10-CM

## 2021-05-01 LAB — ECHOCARDIOGRAM COMPLETE
AR max vel: 1.43 cm2
AV Area VTI: 1.36 cm2
AV Area mean vel: 1.46 cm2
AV Mean grad: 8 mmHg
AV Peak grad: 14.4 mmHg
Ao pk vel: 1.9 m/s
Area-P 1/2: 2.85 cm2
P 1/2 time: 600 msec
S' Lateral: 3.4 cm

## 2021-05-02 ENCOUNTER — Telehealth: Payer: Self-pay | Admitting: *Deleted

## 2021-05-02 DIAGNOSIS — I77819 Aortic ectasia, unspecified site: Secondary | ICD-10-CM

## 2021-05-02 DIAGNOSIS — I350 Nonrheumatic aortic (valve) stenosis: Secondary | ICD-10-CM

## 2021-05-02 NOTE — Telephone Encounter (Signed)
-----   Message from Meriam Sprague, MD sent at 05/01/2021  8:11 PM EDT ----- ?Her echo showed that her pumping function has improved to 50-55%. Her aortic valve is mildly to moderately narrowed and her aorta is mildly dilated. To monitor this, we will repeat an echo in 1 year.  ?

## 2021-05-02 NOTE — Telephone Encounter (Signed)
The patient has been notified of the result and verbalized understanding.  All questions (if any) were answered. ? ?Pt aware I will go ahead and place the order for repeat echo in one year in the system, and send a message to our [facility] Scheduling,  to call her back and arrange this appt for that time.  ?Pt verbalized understanding and agrees with this plan. ? ?

## 2021-05-21 ENCOUNTER — Ambulatory Visit (INDEPENDENT_AMBULATORY_CARE_PROVIDER_SITE_OTHER): Payer: Medicare Other

## 2021-05-21 DIAGNOSIS — I428 Other cardiomyopathies: Secondary | ICD-10-CM | POA: Diagnosis not present

## 2021-05-23 LAB — CUP PACEART REMOTE DEVICE CHECK
Battery Remaining Longevity: 60 mo
Battery Voltage: 2.96 V
Brady Statistic AP VP Percent: 12.81 %
Brady Statistic AP VS Percent: 0.13 %
Brady Statistic AS VP Percent: 85.64 %
Brady Statistic AS VS Percent: 1.42 %
Brady Statistic RA Percent Paced: 12.89 %
Brady Statistic RV Percent Paced: 27.91 %
Date Time Interrogation Session: 20230516113438
Implantable Lead Implant Date: 20190111
Implantable Lead Implant Date: 20190111
Implantable Lead Implant Date: 20190111
Implantable Lead Location: 753858
Implantable Lead Location: 753859
Implantable Lead Location: 753860
Implantable Lead Model: 5076
Implantable Lead Model: 5076
Implantable Pulse Generator Implant Date: 20190111
Lead Channel Impedance Value: 1520 Ohm
Lead Channel Impedance Value: 1520 Ohm
Lead Channel Impedance Value: 1577 Ohm
Lead Channel Impedance Value: 1615 Ohm
Lead Channel Impedance Value: 1634 Ohm
Lead Channel Impedance Value: 1691 Ohm
Lead Channel Impedance Value: 342 Ohm
Lead Channel Impedance Value: 399 Ohm
Lead Channel Impedance Value: 418 Ohm
Lead Channel Impedance Value: 513 Ohm
Lead Channel Impedance Value: 798 Ohm
Lead Channel Impedance Value: 836 Ohm
Lead Channel Impedance Value: 855 Ohm
Lead Channel Impedance Value: 931 Ohm
Lead Channel Pacing Threshold Amplitude: 0.375 V
Lead Channel Pacing Threshold Amplitude: 0.75 V
Lead Channel Pacing Threshold Amplitude: 2.25 V
Lead Channel Pacing Threshold Pulse Width: 0.4 ms
Lead Channel Pacing Threshold Pulse Width: 0.4 ms
Lead Channel Pacing Threshold Pulse Width: 1 ms
Lead Channel Sensing Intrinsic Amplitude: 1.5 mV
Lead Channel Sensing Intrinsic Amplitude: 1.5 mV
Lead Channel Sensing Intrinsic Amplitude: 11.125 mV
Lead Channel Sensing Intrinsic Amplitude: 11.125 mV
Lead Channel Setting Pacing Amplitude: 2 V
Lead Channel Setting Pacing Amplitude: 2.5 V
Lead Channel Setting Pacing Amplitude: 2.75 V
Lead Channel Setting Pacing Pulse Width: 0.4 ms
Lead Channel Setting Pacing Pulse Width: 1 ms
Lead Channel Setting Sensing Sensitivity: 0.9 mV

## 2021-05-24 ENCOUNTER — Telehealth: Payer: Self-pay | Admitting: Cardiology

## 2021-05-24 DIAGNOSIS — I5022 Chronic systolic (congestive) heart failure: Secondary | ICD-10-CM

## 2021-05-24 DIAGNOSIS — Z95810 Presence of automatic (implantable) cardiac defibrillator: Secondary | ICD-10-CM

## 2021-05-24 MED ORDER — CARVEDILOL 12.5 MG PO TABS
12.5000 mg | ORAL_TABLET | Freq: Two times a day (BID) | ORAL | 0 refills | Status: AC
Start: 2021-05-24 — End: ?

## 2021-05-24 MED ORDER — ENTRESTO 24-26 MG PO TABS: 1.0000 | ORAL_TABLET | Freq: Two times a day (BID) | ORAL | 0 refills | Status: DC

## 2021-05-24 NOTE — Telephone Encounter (Signed)
Pt's medications were sent to pt's pharmacy as requested. Confirmation received.  

## 2021-05-24 NOTE — Telephone Encounter (Signed)
 *  STAT* If patient is at the pharmacy, call can be transferred to refill team.   1. Which medications need to be refilled? (please list name of each medication and dose if known) carvedilol (COREG) 12.5 MG tablet sacubitril-valsartan (ENTRESTO) 24-26 MG  2. Which pharmacy/location (including street and city if local pharmacy) is medication to be sent to? [facility] [phone] - [place], [place] - [address]  3. Do they need a 30 day or 90 day supply? 90 days

## 2021-05-31 ENCOUNTER — Encounter: Payer: Self-pay | Admitting: Infectious Disease

## 2021-05-31 ENCOUNTER — Other Ambulatory Visit: Payer: Self-pay

## 2021-05-31 ENCOUNTER — Telehealth: Payer: Self-pay

## 2021-05-31 ENCOUNTER — Ambulatory Visit (INDEPENDENT_AMBULATORY_CARE_PROVIDER_SITE_OTHER): Payer: Medicare Other | Admitting: Infectious Disease

## 2021-05-31 VITALS — Temp 98.6°F | Wt 148.0 lb

## 2021-05-31 DIAGNOSIS — R7881 Bacteremia: Secondary | ICD-10-CM

## 2021-05-31 DIAGNOSIS — W57XXXA Bitten or stung by nonvenomous insect and other nonvenomous arthropods, initial encounter: Secondary | ICD-10-CM

## 2021-05-31 DIAGNOSIS — Z7185 Encounter for immunization safety counseling: Secondary | ICD-10-CM

## 2021-05-31 DIAGNOSIS — B9561 Methicillin susceptible Staphylococcus aureus infection as the cause of diseases classified elsewhere: Secondary | ICD-10-CM

## 2021-05-31 DIAGNOSIS — T8450XS Infection and inflammatory reaction due to unspecified internal joint prosthesis, sequela: Secondary | ICD-10-CM

## 2021-05-31 DIAGNOSIS — T8453XA Infection and inflammatory reaction due to internal right knee prosthesis, initial encounter: Secondary | ICD-10-CM | POA: Diagnosis not present

## 2021-05-31 DIAGNOSIS — S20462A Insect bite (nonvenomous) of left back wall of thorax, initial encounter: Secondary | ICD-10-CM

## 2021-05-31 DIAGNOSIS — L98491 Non-pressure chronic ulcer of skin of other sites limited to breakdown of skin: Secondary | ICD-10-CM

## 2021-05-31 DIAGNOSIS — T827XXA Infection and inflammatory reaction due to other cardiac and vascular devices, implants and grafts, initial encounter: Secondary | ICD-10-CM | POA: Diagnosis present

## 2021-05-31 DIAGNOSIS — T8450XD Infection and inflammatory reaction due to unspecified internal joint prosthesis, subsequent encounter: Secondary | ICD-10-CM

## 2021-05-31 DIAGNOSIS — Z981 Arthrodesis status: Secondary | ICD-10-CM

## 2021-05-31 DIAGNOSIS — Z23 Encounter for immunization: Secondary | ICD-10-CM

## 2021-05-31 DIAGNOSIS — F4321 Adjustment disorder with depressed mood: Secondary | ICD-10-CM

## 2021-05-31 DIAGNOSIS — Z95810 Presence of automatic (implantable) cardiac defibrillator: Secondary | ICD-10-CM

## 2021-05-31 DIAGNOSIS — L98499 Non-pressure chronic ulcer of skin of other sites with unspecified severity: Secondary | ICD-10-CM | POA: Insufficient documentation

## 2021-05-31 HISTORY — DX: Non-pressure chronic ulcer of skin of other sites with unspecified severity: L98.499

## 2021-05-31 HISTORY — DX: Adjustment disorder with depressed mood: F43.21

## 2021-05-31 HISTORY — DX: Bitten or stung by nonvenomous insect and other nonvenomous arthropods, initial encounter: W57.XXXA

## 2021-05-31 MED ORDER — RIFAMPIN 300 MG PO CAPS
ORAL_CAPSULE | ORAL | 3 refills | Status: AC
Start: 2021-05-31 — End: ?

## 2021-05-31 MED ORDER — CEFADROXIL 500 MG PO CAPS: 1000.0000 mg | ORAL_CAPSULE | Freq: Two times a day (BID) | ORAL | 3 refills | Status: AC

## 2021-05-31 NOTE — Progress Notes (Addendum)
Subjective:  Chief complaint follow-up for ICD infection prosthetic knee infection MSSA bacteremia grieving the loss of her husband recently.  Patient ID: [name] [name], female    DOB: 1935-10-24, 86 y.o.   MRN: [id number]  HPI  86 year-old Caucasian female who was initially admitted to [facility] on June 13, 2016 with fevers weakness and diffuse pain.  She had sepsis and hypotension was found to have MSSA bacteremia and transferred to [facility].   for continued care and assessment by EP team considering she has cardiac device. At [facility] she underwent evaluation for metastatic sites of infection including CT of spine, TTE/TEE and BCx monitoring. All repeat BCx were negative once transferred here. TTE/TEE were both negative for lead or valvular endocarditis at that time; received 4 weeks IV Ancef and decision to leave cardiac device.   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 She underwent single staged exchange arthroplasty on right knee by Dr. Veda Canning. Dr. Ladona Ridgel was not available for device extraction here so she was transferred to [facility] where she had cardiac device extracted on August 10th. She had blood cultures and device cultures done with no growth. PICC inserted on 08/18/16. She was sent out on IV cefazolin 2g IV q 8 but also IV rifampin 300 mg IV q 12 at SNF and then home. Managing FIVE infusions was overwhelming for her husband. I changed her to rifampin 300mg  orally BID and then Ceftriaxone 2gram IV push to complete 42 days of therapy post extraction. Initially her cardiac function had mproved quite nicely. We placed her on high dose keflex and bid rifampin and she has been on these meds without fail since I last saw her. She is approaching in December 4 months of systemic antimicrobials since extraction of her device and a  litle more since I and D and exchange of liner of prosthesis.   Her EF had worsened and EP and Cardiology after careful consideration we implanted a biventricular pacemaker on January 17, 2017.   She continued on her cephalexin and rifampin.    Unfortunately [facility] to deliver her cephalexin or rifampin so that she went without any antibiotics for 2 weeks.  She then developed recurrent MSSA bacteremia and septic shock with a recurrent septic knee and was hospitalized at [facility].  Transesophageal echocardiogram showed a thrombus on her device.  She was seen by infectious disease cardiology and orthopedics.  She underwent I&D of the prosthesis without hardware removal bacteremia cleared.  She had MRI of her spine which was negative.  She had additional I&D on 15 December but again retention of hardware she was discharged on cefazolin and rifampin.   He did a phone visit with my partner Dr. Renold Don in January 2022.   She completed IV antibiotics and was placed back on cephalexin at 500 mg 4 times daily along with rifampin twice daily.  She did this for a month and then went back on Keflex 2 tablets twice daily along with rifampin twice daily.  She follows up with me via phone visit in July 2022.  Since then she has continued on cephalexin 2 tablets twice daily without any evidence of recurrence.  She is tolerating medications without difficulty.  She is moving to [place] in the next 30 days but is willing to come back to  [place] to visit with me on a yearly basis.  She did sustain a tick bite on her lateral flank which her daughter was able to remove the tick.  She did not develop any erythema migrans to suggest Borrelia burgdorferi infection and no systemic symptoms to suggest RMSF Ehrlichia.  This was 4 weeks ago.  She also sustained a slight laceration to the skin of her finger on her thumb while she was mowing the lawn.  She  recently lost her husband who was 55 years old and had advanced heart failure but developed severe renal failure that would have required dialysis which she did not want.     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[place])    Left bundle branch block    MSSA bacteremia 06/21/2016   Myocardial infarction ([place])    NICM (nonischemic cardiomyopathy) ([place])    Pancreatitis, acute     Past Surgical History:  Procedure Laterality Date   ABDOMINAL HYSTERECTOMY     APPENDECTOMY     BIV PACEMAKER INSERTION CRT-P N/A 01/17/2017   Procedure: BIV PACEMAKER INSERTION CRT-P;  Surgeon: Deboraha Sprang, MD;  Location: [place] CV [facility];  Service: Cardiovascular;  Laterality: N/A;   CARDIAC CATHETERIZATION     CARDIAC DEFIBRILLATOR PLACEMENT  ?2016   CATARACT EXTRACTION W/ INTRAOCULAR LENS  IMPLANT, BILATERAL Bilateral    CHOLECYSTECTOMY OPEN     "related to pancreatitis"   DILATION AND CURETTAGE OF UTERUS     EP IMPLANTABLE DEVICE N/A 07/04/2014   Procedure: BiV ICD Insertion CRT-D;  Surgeon: Deboraha Sprang, MD;  Location: [place] [facility];  Service: Cardiovascular;  Laterality: N/A;   ESOPHAGOGASTRODUODENOSCOPY (EGD) WITH PROPOFOL N/A 05/08/2017   Procedure: ESOPHAGOGASTRODUODENOSCOPY (EGD) WITH PROPOFOL;  Surgeon: Wonda Horner, MD;  Location: [facility] ENDOSCOPY;  Service: Endoscopy;  Laterality: N/A;   HERNIA REPAIR     I & D  KNEE WITH POLY EXCHANGE Right 08/11/2016   Procedure: IRRIGATION AND DEBRIDEMENT KNEE WITH POLY EXCHANGE;  Surgeon: Rod Can, MD;  Location: [place];  Service: Orthopedics;  Laterality: Right;   INSERT / REPLACE / REMOVE PACEMAKER  ?2016   JOINT REPLACEMENT Bilateral    "thumb joints; used my own material"   JOINT REPLACEMENT     LEFT HEART CATHETERIZATION WITH CORONARY ANGIOGRAM N/A 11/02/2013   Procedure: LEFT HEART CATHETERIZATION WITH CORONARY ANGIOGRAM;  Surgeon: Jettie Booze, MD;  Location: [facility] CATH LAB;  Service: Cardiovascular;  Laterality: N/A;   LOOP RECORDER IMPLANT N/A 04/13/2014   Procedure: LOOP RECORDER IMPLANT;  Surgeon: Deboraha Sprang, MD;  Location: [facility] CATH LAB;  Service: Cardiovascular;  Laterality: N/A;   LOOP RECORDER REMOVAL  ?2016   POSTERIOR LUMBAR FUSION  05/03/2016   Lumbar Two-Three, Lumbar Three-Four Posterior Lumbar Fusion withLaminectomy and Foraminotomy   SPINE SURGERY  04/2016   lumbar  TEE WITHOUT CARDIOVERSION N/A 08/14/2016   Procedure: TRANSESOPHAGEAL ECHOCARDIOGRAM (TEE);  Surgeon: Dorothy Spark, MD;  Location: [facility] ENDOSCOPY;  Service: Cardiovascular;  Laterality: N/A;   TONSILLECTOMY     TOTAL HIP ARTHROPLASTY Bilateral    UMBILICAL HERNIA REPAIR  1970s?    Family History  Problem Relation Age of Onset   Hyperlipidemia Mother    Arrhythmia Mother        palpitations   Heart disease Father    Heart attack Father    Heart attack Maternal Grandfather    Heart attack Paternal Grandfather       Social History   Socioeconomic History   Marital status: Married    Spouse name: Not on file   Number of children: Not on file   Years of education: Not on file   Highest education level: Not on file  Occupational History   Not on file  Tobacco Use   Smoking status: Never   Smokeless tobacco: Never   Tobacco comments:    "smoked as a teenager; for maybe 1 wk"  Vaping Use   Vaping Use: Never used  Substance and Sexual Activity    Alcohol use: Not Currently    Comment: 6/15//2018 "glass of wine q 6 months or so"   Drug use: No   Sexual activity: Not on file  Other Topics Concern   Not on file  Social History Narrative   Not on file   Social Determinants of Health   Financial Resource Strain: Not on file  Food Insecurity: Not on file  Transportation Needs: Not on file  Physical Activity: Not on file  Stress: Not on file  Social Connections: Not on file    Allergies  Allergen Reactions   Sulfa Antibiotics Hives     Current Outpatient Medications:    albuterol (VENTOLIN HFA) 108 (90 Base) MCG/ACT inhaler, Inhale 2 puffs into the lungs every 6 (six) hours as needed for wheezing or shortness of breath., Disp: , Rfl:    Calcium Carbonate Antacid (TUMS PO), Take 1 tablet by mouth daily as needed (indigestion)., Disp: , Rfl:    cephALEXin (KEFLEX) 500 MG capsule, Take 2 capsules (1,000 mg total) by mouth 2 (two) times daily., Disp: 120 capsule, Rfl: 0   feeding supplement, ENSURE ENLIVE, (ENSURE ENLIVE) LIQD, Take 237 mLs by mouth 2 (two) times daily between meals., Disp: 237 mL, Rfl: 12   furosemide (LASIX) 40 MG tablet, TAKE 1 TABLET DAILY (DOSE INCREASED), Disp: 90 tablet, Rfl: 3   levothyroxine (SYNTHROID) 175 MCG tablet, Take 1 tablet (175 mcg total) by mouth daily before breakfast., Disp: 90 tablet, Rfl: 1   rifampin (RIFADIN) 300 MG capsule, TAKE 1 CAPSULE BY MOUTH EVERY 12 HOURS, Disp: 60 capsule, Rfl: 0   sacubitril-valsartan (ENTRESTO) 24-26 MG, Take 1 tablet by mouth 2 (two) times daily., Disp: 180 tablet, Rfl: 0   acetaminophen (TYLENOL) 325 MG tablet, Take 650 mg by mouth every 6 (six) hours as needed. (Patient not taking: Reported on 05/31/2021), Disp: , Rfl:    Aspirin-Acetaminophen-Caffeine (EXCEDRIN PO), Take 1 tablet by mouth as needed. (Patient not taking: Reported on 05/31/2021), Disp: , Rfl:    betamethasone dipropionate (DIPROLENE) 0.05 % ointment, Apply 1 application topically daily as needed.  (Patient not taking: Reported on 05/31/2021), Disp: , Rfl:    carvedilol (COREG) 12.5 MG tablet, Take 1 tablet (12.5 mg total) by mouth 2 (two) times daily. (Patient not taking: Reported on 05/31/2021), Disp: 180 tablet, Rfl: 0  famotidine (PEPCID) 20 MG tablet, Take 20 mg by mouth 2 (two) times daily as needed. (Patient not taking: Reported on 05/31/2021), Disp: , Rfl:    Fluticasone-Salmeterol (ADVAIR) 250-50 MCG/DOSE AEPB, Inhale 1 puff into the lungs 2 (two) times daily. (Patient not taking: Reported on 05/31/2021), Disp: , Rfl:    Melatonin 3 MG TABS, Take 3 mg by mouth at bedtime as needed (for sleep.).  (Patient not taking: Reported on 05/31/2021), Disp: , Rfl:    methocarbamol (ROBAXIN) 500 MG tablet, Take 1 tablet (500 mg total) by mouth every 6 (six) hours as needed for muscle spasms. (Patient not taking: Reported on 05/31/2021), Disp: 10 tablet, Rfl: 0   methylphenidate (RITALIN) 20 MG tablet, Take 40 mg by mouth daily. (Patient not taking: Reported on 05/31/2021), Disp: , Rfl:    Naphazoline-Pheniramine (ALLERGY EYE OP), Place 1-2 drops into both eyes daily as needed (for irritated eyes). (Patient not taking: Reported on 05/31/2021), Disp: , Rfl:    naproxen (NAPROSYN) 500 MG tablet, Take 500 mg by mouth 2 (two) times daily as needed. (Patient not taking: Reported on 05/31/2021), Disp: , Rfl:    nitroGLYCERIN (NITROSTAT) 0.4 MG SL tablet, Place 1 tablet (0.4 mg total) every 5 (five) minutes as needed under the tongue for chest pain., Disp: 90 tablet, Rfl: 3   ondansetron (ZOFRAN) 8 MG tablet, Take 8 mg by mouth as needed for nausea or vomiting. (Patient not taking: Reported on 05/31/2021), Disp: , Rfl:    pantoprazole (PROTONIX) 40 MG tablet, Take 40 mg by mouth daily. (Patient not taking: Reported on 05/31/2021), Disp: , Rfl:    polyethylene glycol (MIRALAX / GLYCOLAX) packet, Take 17 g by mouth daily as needed for mild constipation. (Patient not taking: Reported on 05/31/2021), Disp: 14 each, Rfl: 0    sucralfate (CARAFATE) 1 g tablet, Take 1 g by mouth daily. (Patient not taking: Reported on 05/31/2021), Disp: , Rfl:     Review of Systems  Constitutional:  Negative for activity change, appetite change, chills, diaphoresis, fatigue, fever and unexpected weight change.  HENT:  Negative for congestion, rhinorrhea, sinus pressure, sneezing, sore throat and trouble swallowing.   Eyes:  Negative for photophobia and visual disturbance.  Respiratory:  Negative for cough, chest tightness, shortness of breath, wheezing and stridor.   Cardiovascular:  Negative for chest pain, palpitations and leg swelling.  Gastrointestinal:  Negative for abdominal distention, abdominal pain, anal bleeding, blood in stool, constipation, diarrhea, nausea and vomiting.  Genitourinary:  Negative for difficulty urinating, dysuria, flank pain and hematuria.  Musculoskeletal:  Negative for arthralgias, back pain, gait problem, joint swelling and myalgias.  Skin:  Positive for wound. Negative for color change, pallor and rash.  Neurological:  Negative for dizziness, tremors, weakness and light-headedness.  Hematological:  Negative for adenopathy. Does not bruise/bleed easily.  Psychiatric/Behavioral:  Negative for agitation, behavioral problems, confusion, decreased concentration, dysphoric mood and sleep disturbance.       Objective:   Physical Exam Constitutional:      General: She is not in acute distress.    Appearance: Normal appearance. She is well-developed. She is not ill-appearing or diaphoretic.  HENT:     Head: Normocephalic and atraumatic.     Right Ear: Hearing and external ear normal.     Left Ear: Hearing and external ear normal.     Nose: No nasal deformity or rhinorrhea.  Eyes:     General: No scleral icterus.    Conjunctiva/sclera: Conjunctivae normal.  Right eye: Right conjunctiva is not injected.     Left eye: Left conjunctiva is not injected.     Pupils: Pupils are equal, round, and  reactive to light.  Neck:     Vascular: No JVD.  Cardiovascular:     Rate and Rhythm: Normal rate and regular rhythm.     Heart sounds: S1 normal and S2 normal.  Pulmonary:     Effort: Pulmonary effort is normal. No respiratory distress.     Breath sounds: No wheezing.  Abdominal:     General: Bowel sounds are normal. There is no distension.     Palpations: Abdomen is soft.     Tenderness: There is no abdominal tenderness.  Musculoskeletal:        General: Normal range of motion.     Right shoulder: Normal.     Left shoulder: Normal.     Cervical back: Normal range of motion and neck supple.     Right hip: Normal.     Left hip: Normal.     Right knee: Normal.     Left knee: Normal.  Lymphadenopathy:     Head:     Right side of head: No submandibular, preauricular or posterior auricular adenopathy.     Left side of head: No submandibular, preauricular or posterior auricular adenopathy.     Cervical: No cervical adenopathy.     Right cervical: No superficial or deep cervical adenopathy.    Left cervical: No superficial or deep cervical adenopathy.  Skin:    General: Skin is warm and dry.     Coloration: Skin is not pale.     Findings: No abrasion, bruising, ecchymosis, erythema, lesion or rash.     Nails: There is no clubbing.  Neurological:     General: No focal deficit present.     Mental Status: She is alert and oriented to person, place, and time.     Sensory: No sensory deficit.     Coordination: Coordination normal.     Gait: Gait normal.  Psychiatric:        Attention and Perception: She is attentive.        Mood and Affect: Mood normal.        Speech: Speech normal.        Behavior: Behavior normal. Behavior is cooperative.        Thought Content: Thought content normal.        Judgment: Judgment normal.          Assessment & Plan:   Recurrent MSSA bacteremia due to prosthetic knee infection status post I&D of knee with recurrence of bacteremia and removal  of a cardiac device and continue antibiotics and then recurrence when her antibiotics were not given for 2 weeks with recurrence again of the prosthetic knee infection status post I&D twice with attention the prosthetic material and again involvement of her cardiac device now status post IV antibiotics and on chronic oral antibiotics initially with Keflex and rifampin  Check CBC and BMP today  I am going to switch her to cefadroxil two 500 mg twice daily along with continued Rifampin 300mg  twice daily  I have given a 90-day supply of each with 3 refills.  She is continue on indefinite lifelong therapy preferably with both drugs.  If she has trouble tolerating cefadroxil we can always switch this back to cephalexin.  Bite no evidence of infection  Slight ulcer on skin without evidence of infection   Grieving: gave support to  her   Vaccine counseling: recommended Prevnar 20 which we gave to her.

## 2021-05-31 NOTE — Telephone Encounter (Signed)
Per Dr. Tommy Medal called patient to inform her that 90 day refill has been sent in for Cefadroxil and Rifampin. Voicemail is not set up. Will send mychart message. Leatrice Jewels, RMA

## 2021-06-01 LAB — CBC WITH DIFFERENTIAL/PLATELET
Absolute Monocytes: 368 cells/uL (ref 200–950)
Basophils Absolute: 41 cells/uL (ref 0–200)
Basophils Relative: 0.9 %
Eosinophils Absolute: 368 cells/uL (ref 15–500)
Eosinophils Relative: 8 %
HCT: 39.5 % (ref 35.0–45.0)
Hemoglobin: 13.3 g/dL (ref 11.7–15.5)
Lymphs Abs: 828 cells/uL — ABNORMAL LOW (ref 850–3900)
MCH: 31.8 pg (ref 27.0–33.0)
MCHC: 33.7 g/dL (ref 32.0–36.0)
MCV: 94.5 fL (ref 80.0–100.0)
MPV: 11.8 fL (ref 7.5–12.5)
Monocytes Relative: 8 %
Neutro Abs: 2995 cells/uL (ref 1500–7800)
Neutrophils Relative %: 65.1 %
Platelets: 170 10*3/uL (ref 140–400)
RBC: 4.18 10*6/uL (ref 3.80–5.10)
RDW: 12.2 % (ref 11.0–15.0)
Total Lymphocyte: 18 %
WBC: 4.6 10*3/uL (ref 3.8–10.8)

## 2021-06-01 LAB — BASIC METABOLIC PANEL WITH GFR
BUN/Creatinine Ratio: 27 (calc) — ABNORMAL HIGH (ref 6–22)
BUN: 26 mg/dL — ABNORMAL HIGH (ref 7–25)
CO2: 28 mmol/L (ref 20–32)
Calcium: 9.9 mg/dL (ref 8.6–10.4)
Chloride: 105 mmol/L (ref 98–110)
Creat: 0.96 mg/dL — ABNORMAL HIGH (ref 0.60–0.95)
Glucose, Bld: 96 mg/dL (ref 65–99)
Potassium: 4.1 mmol/L (ref 3.5–5.3)
Sodium: 141 mmol/L (ref 135–146)
eGFR: 59 mL/min/{1.73_m2} — ABNORMAL LOW (ref >=60)

## 2021-06-08 NOTE — Progress Notes (Signed)
Remote pacemaker transmission.   

## 2021-06-26 ENCOUNTER — Telehealth: Payer: Self-pay

## 2021-06-26 ENCOUNTER — Other Ambulatory Visit: Payer: Self-pay

## 2021-06-26 MED ORDER — CEPHALEXIN 500 MG PO CAPS: ORAL_CAPSULE | ORAL | 11 refills | Status: AC

## 2021-06-26 NOTE — Telephone Encounter (Signed)
Please advise: Patient was switched from Keflex to Cefadroxil 05/31/21 and over last few days noticed severe nausea after taking. Takes on full stomach. Denies diarrhea or vomiting. Patient reports Keflex was easier on her stomach. Does not have meds for nausea. Moving to [facility] in a few days.   [facility] [address] [place].   Patient can be reached at [phone]

## 2021-07-16 ENCOUNTER — Telehealth: Payer: Self-pay | Admitting: Internal Medicine

## 2021-07-16 ENCOUNTER — Telehealth: Payer: Self-pay

## 2021-07-16 NOTE — Telephone Encounter (Signed)
Unable to contact patient at [phone], number not in service.... [phone], unable to leave voice mail message...   Follow up still required per pt request for med refills in [place].

## 2021-07-16 NOTE — Telephone Encounter (Signed)
Pt c/o medication issue:  1. Name of Medication: carvedilol (COREG) 12.5 MG tablet; sacubitril-valsartan (ENTRESTO) 24-26 MG  2. How are you currently taking this medication (dosage and times per day)? As written on prescription  3. Are you having a reaction (difficulty breathing--STAT)? No   4. What is your medication issue? Patient has recently moved to [place] and would like for her prescription to be sent over to  [id number] - [place], [place] - [address] AT [facility] [address]. Patient needs a new 180 pill, 3 refill prescription sent to above address

## 2021-07-16 NOTE — Telephone Encounter (Signed)
Patient's daughter [name] [name] called with questions about transferring antibiotic prescriptions. Patient has moved to [facility], and will be following up with new PCP sometime after 07/27/21.  Confirmed refills were available for rifampin and cefalexin. Advised daughter she can call pharmacy to transfer prescriptions as needed. All questions answered.  Wyvonne Lenz, RN

## 2021-07-16 NOTE — Telephone Encounter (Signed)
See [employer]

## 2021-07-27 ENCOUNTER — Ambulatory Visit (HOSPITAL_COMMUNITY): Payer: Self-pay | Admitting: Family Medicine

## 2021-09-25 ENCOUNTER — Other Ambulatory Visit: Payer: Self-pay

## 2021-09-25 DIAGNOSIS — Z95810 Presence of automatic (implantable) cardiac defibrillator: Secondary | ICD-10-CM

## 2021-09-25 DIAGNOSIS — I5022 Chronic systolic (congestive) heart failure: Secondary | ICD-10-CM

## 2021-09-25 MED ORDER — ENTRESTO 24-26 MG PO TABS: 1.0000 | ORAL_TABLET | Freq: Two times a day (BID) | ORAL | 0 refills | Status: AC

## 2021-10-05 LAB — COLOGUARD® COLON CANCER SCREEN: COLOGUARD RESULT: NEGATIVE

## 2021-10-05 LAB — LAB: COLOGUARD RESULT: NEGATIVE

## 2021-11-19 ENCOUNTER — Ambulatory Visit (INDEPENDENT_AMBULATORY_CARE_PROVIDER_SITE_OTHER): Payer: Medicare Other

## 2021-11-19 DIAGNOSIS — I428 Other cardiomyopathies: Secondary | ICD-10-CM | POA: Diagnosis not present

## 2021-11-23 LAB — CUP PACEART REMOTE DEVICE CHECK
Battery Remaining Longevity: 53 mo
Battery Voltage: 2.95 V
Brady Statistic AP VP Percent: 15.05 %
Brady Statistic AP VS Percent: 0.12 %
Brady Statistic AS VP Percent: 83.55 %
Brady Statistic AS VS Percent: 1.28 %
Brady Statistic RA Percent Paced: 15.13 %
Brady Statistic RV Percent Paced: 22.69 %
Date Time Interrogation Session: 20231116172958
Implantable Lead Connection Status: 753985
Implantable Lead Connection Status: 753985
Implantable Lead Connection Status: 753985
Implantable Lead Implant Date: 20190111
Implantable Lead Implant Date: 20190111
Implantable Lead Implant Date: 20190111
Implantable Lead Location: 753858
Implantable Lead Location: 753859
Implantable Lead Location: 753860
Implantable Lead Model: 5076
Implantable Lead Model: 5076
Implantable Pulse Generator Implant Date: 20190111
Lead Channel Impedance Value: 1482 Ohm
Lead Channel Impedance Value: 1501 Ohm
Lead Channel Impedance Value: 1539 Ohm
Lead Channel Impedance Value: 1615 Ohm
Lead Channel Impedance Value: 1634 Ohm
Lead Channel Impedance Value: 1710 Ohm
Lead Channel Impedance Value: 380 Ohm
Lead Channel Impedance Value: 380 Ohm
Lead Channel Impedance Value: 437 Ohm
Lead Channel Impedance Value: 475 Ohm
Lead Channel Impedance Value: 779 Ohm
Lead Channel Impedance Value: 836 Ohm
Lead Channel Impedance Value: 874 Ohm
Lead Channel Impedance Value: 912 Ohm
Lead Channel Pacing Threshold Amplitude: 0.5 V
Lead Channel Pacing Threshold Amplitude: 0.875 V
Lead Channel Pacing Threshold Amplitude: 1.875 V
Lead Channel Pacing Threshold Pulse Width: 0.4 ms
Lead Channel Pacing Threshold Pulse Width: 0.4 ms
Lead Channel Pacing Threshold Pulse Width: 1 ms
Lead Channel Sensing Intrinsic Amplitude: 1.625 mV
Lead Channel Sensing Intrinsic Amplitude: 1.625 mV
Lead Channel Sensing Intrinsic Amplitude: 12.625 mV
Lead Channel Sensing Intrinsic Amplitude: 12.625 mV
Lead Channel Setting Pacing Amplitude: 2 V
Lead Channel Setting Pacing Amplitude: 2.5 V
Lead Channel Setting Pacing Amplitude: 2.75 V
Lead Channel Setting Pacing Pulse Width: 0.4 ms
Lead Channel Setting Pacing Pulse Width: 1 ms
Lead Channel Setting Sensing Sensitivity: 0.9 mV
Zone Setting Status: 755011
Zone Setting Status: 755011

## 2021-12-21 ENCOUNTER — Ambulatory Visit (INDEPENDENT_AMBULATORY_CARE_PROVIDER_SITE_OTHER): Payer: Medicare Other | Admitting: NURSE PRACTITIONER

## 2021-12-21 ENCOUNTER — Other Ambulatory Visit: Payer: Self-pay

## 2021-12-21 ENCOUNTER — Encounter (INDEPENDENT_AMBULATORY_CARE_PROVIDER_SITE_OTHER): Payer: Self-pay | Admitting: NURSE PRACTITIONER

## 2021-12-21 VITALS — BP 125/63 | HR 71 | Temp 96.1°F | Resp 16 | Ht <= 58 in | Wt 138.0 lb

## 2021-12-21 DIAGNOSIS — I5042 Chronic combined systolic (congestive) and diastolic (congestive) heart failure: Secondary | ICD-10-CM

## 2021-12-21 DIAGNOSIS — I428 Other cardiomyopathies: Secondary | ICD-10-CM

## 2021-12-21 DIAGNOSIS — I480 Paroxysmal atrial fibrillation: Secondary | ICD-10-CM | POA: Insufficient documentation

## 2021-12-21 DIAGNOSIS — R0609 Other forms of dyspnea: Secondary | ICD-10-CM | POA: Insufficient documentation

## 2021-12-21 DIAGNOSIS — I10 Essential (primary) hypertension: Secondary | ICD-10-CM | POA: Insufficient documentation

## 2021-12-21 DIAGNOSIS — I4729 Other ventricular tachycardia (CMS HCC): Secondary | ICD-10-CM | POA: Insufficient documentation

## 2021-12-21 DIAGNOSIS — Z95810 Presence of automatic (implantable) cardiac defibrillator: Secondary | ICD-10-CM | POA: Insufficient documentation

## 2021-12-21 MED ORDER — ENTRESTO 24 MG-26 MG TABLET
1.0000 | ORAL_TABLET | Freq: Two times a day (BID) | ORAL | 3 refills | Status: DC
Start: 2021-12-21 — End: 2022-10-02

## 2021-12-21 MED ORDER — METOPROLOL SUCCINATE ER 25 MG TABLET,EXTENDED RELEASE 24 HR
12.5000 mg | ORAL_TABLET | Freq: Every day | ORAL | 4 refills | Status: DC
Start: 2021-12-21 — End: 2023-03-10

## 2021-12-21 NOTE — Progress Notes (Signed)
[facility]  [address]  [place] [address]  [phone]    Cardiology  Clinic Note    Name: Ana Kennedy   DOB: [birth year]  [86 y.o. female]   MRN: [id number]       Visit Date: 12/21/2021   Referring: No referring provider defined for this encounter.   PCP: Arn Medal, DO         Chief Complaint: Establish Care and Shortness of Breath      History of Present Illness   Ana Kennedy is a 86 y.o. White female who presents as a referral from her PCP to establish care with Dr. Guadlupe Spanish for CHF-combined, dilated cardiomyopathy, biventricular ICD explant with biventricular PPM implant.  She has PMH of hypertension, septicemia with chronic antibiotic management, hypothyroid, NSVT, left bundle-branch block, PAF, narcolepsy, mild dementia.  She recently moved to [place] from [facility] and is establishing with new physicians.  She states she had an echocardiogram earlier this year with her previous cardiologist but I do not have the result at time of visit.  Her last office note shows normal device function but today she is complaining of a thumping sensation which is most likely diaphragm stimulation.  She has occasional dyspnea on exertion that relieves with rest.  She takes furosemide p.r.n. and weighs herself daily.  Her EKG shows ventricular pacing/sinus rhythm.    Of note, she has had loop recorder placement in the past as well as BiV ICD that had to be explanted at [facility] in 2016.  She had vegetation on leads after having an orthopedic surgery and she missed some abx.  She was re-implanted with a [employer] BiV PPM.  The records do not indicate why a PPM was re-implanted and I am unsure of what her EF was at that time.  She has had and ejection fraction as low as 30% in the past, per her records.  She has had septicemia 5 times total and takes daily rifampin and keflex.  She is accompanied by her  daughter, whom she lives with.  She states they are seeing a ID specialist here.  She is on CHF GDMT with entresto.  Carvedilol was listed in her med list from her PCP visit but not today.  She had a heart catheterization and 2015 that I do not have results of.  She has also had a cardioversion in the past in 2018.    She denies chest pain, palpitations, racing heart rates, or edema.  She is connected to [facility] for device transmissions and believes they are still going to her EP in [place] but she needs this changed to a local EP.      Patient Active Problem List    Diagnosis Date Noted    NICM (nonischemic cardiomyopathy) (CMS HCC) 12/21/2021    Chronic combined systolic and diastolic CHF, NYHA class 2 (CMS HCC) 12/21/2021    DOE (dyspnea on exertion) 12/21/2021    Automatic implantable cardiac defibrillator in situ 12/21/2021    Ventricular tachycardia (paroxysmal) (CMS HCC) 12/21/2021    PAF (paroxysmal atrial fibrillation) (CMS HCC) 12/21/2021    Essential hypertension 12/21/2021       Allergies  No Known Allergies    Medications    Current Outpatient Medications:     cephalexin (KEFLEX) 500 mg Oral Capsule, Take 1 Capsule (500 mg total) by mouth Twice daily, Disp: , Rfl:     ENTRESTO 24-26 mg  Oral Tablet, Take 1 Tablet by mouth Twice daily, Disp: , Rfl:     levothyroxine (SYNTHROID) 200 mcg Oral Tablet, Take 1 Tablet (200 mcg total) by mouth Every morning, Disp: , Rfl:     rifAMPin (RIFADIN) 300 mg Oral Capsule, Take 1 Capsule (300 mg total) by mouth Every 12 hours, Disp: , Rfl:     History  Past Medical History:   Diagnosis Date    Hypothyroidism          Past Surgical History:   Procedure Laterality Date    HX KNEE SURGERY Bilateral      Social History     Socioeconomic History    Marital status: Widowed   Tobacco Use    Smoking status: Never    Smokeless tobacco: Never   Vaping Use    Vaping Use: Never used   Substance and Sexual Activity    Alcohol use: Yes     Comment: rarely    Drug use: Never     Family  Medical History:       Problem Relation (Age of Onset)    Diabetes Father    Heart Disease Mother, Father              Review of Systems:  Ten point ROS obtained from patient and negative other than what is specified in HPI.    Physical Examination:  BP 125/63 (Site: Right, Patient Position: Sitting, Cuff Size: Adult)   Pulse 71   Temp (!) 35.6 C (96.1 F)   Resp 16   Ht 1.448 m (4\' 9" )   Wt 62.6 kg (138 lb)   SpO2 98%   BMI 29.86 kg/m       General: Awake. Well nourished. No acute distress. A&O x 3.  HEENT: Normocephalic, atraumatic. PERRL. Normal hearing.  Neck: No JVD or bruit. Trachea midline.  Lungs: Clear to auscultation. No rhonchi or wheezing noted. Unlabored.  Heart: Regular rate and rhythm. No murmurs, rubs, or gallops are appreciated.  Abdomen: BS normal x 4 quadrants. Soft, non-tender, non-distended.  Extremities: No clubbing, cyanosis, or edema.  Psychiatric: Cooperative, appropriate mood and affect.  Musculoskeletal: Strength grips are equal. No red, swollen joints. MAE.  Skin: Warm, dry, and intact. No rashes or hives are noted.     Diagnostics  ECG:  To be reviewed by Dr       Echo:  12/2019 at [facility]   Left ventricle is normal in size with normal wall thickness.  Left ventricular systolic function is estimated greater than 55%.  Mitral valve leaflets are thickened with normal mobility.  Moderate mitral annular calcification is present.  Mild regurgitation.  Aortic valve is trileaflet with mildly thickened leaflets and normal excursion.  Right ventricle is normal in size with normal systolic function.  Mild pulmonary hypertension is noted with RVSP at 39 mmHg.  IVC size and inspiratory change suggests mildly elevated right atrial pressure.    TEE:  12/2019 at [facility] linear echoes associated with device lead, can not exclude infectious vegetation.  No apparent valvular vegetation is visualized.  There was mild mitral  valve regurgitation.  Aortic sclerosis.  Mild aortic regurgitation.        No orders of the defined types were placed in this encounter.      There are no discontinued medications.    Assessment and Plan:  Assessment/Plan   1. NICM (nonischemic cardiomyopathy) (CMS HCC)  2. Chronic combined systolic and diastolic CHF, NYHA class 2 (CMS HCC)    3. DOE (dyspnea on exertion)    4. Automatic implantable cardiac defibrillator in situ    5. Ventricular tachycardia (paroxysmal) (CMS HCC)    6. PAF (paroxysmal atrial fibrillation) (CMS HCC)    7. Essential hypertension        PLAN:  The patient is doing well from a cardiac standpoint and does not have any worsening exertional dyspnea.  I will obtain a copy of her echocardiogram from earlier this year.  Depending on those results we may need to repeat 1 soon.  She is not taking carvedilol and is unsure why this was stopped.  I will start her on metoprolol succinate 12.5 mg at bedtime so that she does not experience drops in blood pressure as she is on Entresto.  I will also refer her to electrophysiology a sap for device management and to clear discrepancy on whether this is a defibrillator versus pacemaker.  She is also having diaphragm stimulation and will need to have her device manually interrogated and adjusted to resolve this.  She has not on a statin due to prior intolerance and we will leave monitoring of lipids to her PCP.  I will also defer hypertension management to her PCP.  She is to continue with her daily weights and taking Lasix p.r.n.[name]  She will return in 6 months and sooner if needed.      Follow up:  Return in about 6 months (around 06/22/2022).        Estrella Deeds, NP  [facility]  Cardiology  [facility]    A portion of this documentation may have been generated using [facility] voice recognition software and may contain syntax/voice recognition errors.

## 2021-12-24 NOTE — Procedures (Signed)
[place]  [address]  [place] [address]    Procedure Note    Name: Ana Kennedy MRN:  [id number]   Date: 12/21/2021 Age: 86 y.o.  DOB:   1935/12/15       ECG - In Clinic    Performed by: Neita Goodnight, MD  Authorized by: Ralph Leyden, NP      Ventricularly paced rhythm at a rate of approximately 70  Neita Goodnight, MD

## 2021-12-25 ENCOUNTER — Telehealth (INDEPENDENT_AMBULATORY_CARE_PROVIDER_SITE_OTHER): Payer: Self-pay | Admitting: INTERVENTIONAL CARDIOLOGY

## 2021-12-25 NOTE — Telephone Encounter (Signed)
Ana Kennedy patient needs a refill on her Entresto 24-26 mg twice a day 90 day supply. She gets her refills at [facility] in [place]

## 2022-01-02 NOTE — Progress Notes (Signed)
Remote pacemaker transmission.   

## 2022-02-18 ENCOUNTER — Ambulatory Visit: Payer: Medicare Other

## 2022-02-18 DIAGNOSIS — I428 Other cardiomyopathies: Secondary | ICD-10-CM

## 2022-02-19 LAB — CUP PACEART REMOTE DEVICE CHECK
Battery Remaining Longevity: 47 mo
Battery Voltage: 2.94 V
Brady Statistic AP VP Percent: 14.44 %
Brady Statistic AP VS Percent: 0.07 %
Brady Statistic AS VP Percent: 84.23 %
Brady Statistic AS VS Percent: 1.26 %
Brady Statistic RA Percent Paced: 14.48 %
Brady Statistic RV Percent Paced: 27.37 %
Date Time Interrogation Session: 20240212052846
Implantable Lead Connection Status: 753985
Implantable Lead Connection Status: 753985
Implantable Lead Connection Status: 753985
Implantable Lead Implant Date: 20190111
Implantable Lead Implant Date: 20190111
Implantable Lead Implant Date: 20190111
Implantable Lead Location: 753858
Implantable Lead Location: 753859
Implantable Lead Location: 753860
Implantable Lead Model: 5076
Implantable Lead Model: 5076
Implantable Pulse Generator Implant Date: 20190111
Lead Channel Impedance Value: 1539 Ohm
Lead Channel Impedance Value: 1577 Ohm
Lead Channel Impedance Value: 1615 Ohm
Lead Channel Impedance Value: 1634 Ohm
Lead Channel Impedance Value: 1672 Ohm
Lead Channel Impedance Value: 1767 Ohm
Lead Channel Impedance Value: 342 Ohm
Lead Channel Impedance Value: 380 Ohm
Lead Channel Impedance Value: 418 Ohm
Lead Channel Impedance Value: 456 Ohm
Lead Channel Impedance Value: 779 Ohm
Lead Channel Impedance Value: 855 Ohm
Lead Channel Impedance Value: 931 Ohm
Lead Channel Impedance Value: 950 Ohm
Lead Channel Pacing Threshold Amplitude: 0.375 V
Lead Channel Pacing Threshold Amplitude: 0.875 V
Lead Channel Pacing Threshold Amplitude: 1.75 V
Lead Channel Pacing Threshold Pulse Width: 0.4 ms
Lead Channel Pacing Threshold Pulse Width: 0.4 ms
Lead Channel Pacing Threshold Pulse Width: 1 ms
Lead Channel Sensing Intrinsic Amplitude: 1.625 mV
Lead Channel Sensing Intrinsic Amplitude: 1.625 mV
Lead Channel Sensing Intrinsic Amplitude: 10.875 mV
Lead Channel Sensing Intrinsic Amplitude: 10.875 mV
Lead Channel Setting Pacing Amplitude: 2 V
Lead Channel Setting Pacing Amplitude: 2.5 V
Lead Channel Setting Pacing Amplitude: 2.75 V
Lead Channel Setting Pacing Pulse Width: 0.4 ms
Lead Channel Setting Pacing Pulse Width: 1 ms
Lead Channel Setting Sensing Sensitivity: 0.9 mV
Zone Setting Status: 755011
Zone Setting Status: 755011

## 2022-03-08 ENCOUNTER — Encounter (HOSPITAL_COMMUNITY): Payer: Self-pay | Admitting: Cardiology

## 2022-03-22 ENCOUNTER — Telehealth (HOSPITAL_COMMUNITY): Payer: Self-pay | Admitting: Cardiology

## 2022-03-22 NOTE — Telephone Encounter (Signed)
Just an FYI. We have made several attempts to contact this patient including sending a letter to schedule or reschedule their echocardiogram. We will be removing the patient from the echo [place].    03/22/22 called hm#  and dcs'd and mobile number is spanish person (Not her number) [facility]  03/08/22 Called and Number is dsc'd- MAILED LETTER [facility]  due in one year      Thank you

## 2022-04-03 NOTE — Progress Notes (Signed)
Remote pacemaker transmission.   

## 2022-05-16 ENCOUNTER — Emergency Department (HOSPITAL_COMMUNITY): Payer: Medicare Other

## 2022-05-16 ENCOUNTER — Other Ambulatory Visit: Payer: Self-pay

## 2022-05-16 ENCOUNTER — Encounter (HOSPITAL_COMMUNITY): Payer: Self-pay | Admitting: Nurse Practitioner

## 2022-05-16 ENCOUNTER — Emergency Department
Admission: EM | Admit: 2022-05-16 | Discharge: 2022-05-16 | Disposition: A | Discharge status: Home / Self Care | Payer: Medicare Other | Attending: Nurse Practitioner | Admitting: Nurse Practitioner

## 2022-05-16 DIAGNOSIS — M5430 Sciatica, unspecified side: Secondary | ICD-10-CM

## 2022-05-16 DIAGNOSIS — M5440 Lumbago with sciatica, unspecified side: Secondary | ICD-10-CM

## 2022-05-16 DIAGNOSIS — M47816 Spondylosis without myelopathy or radiculopathy, lumbar region: Secondary | ICD-10-CM | POA: Insufficient documentation

## 2022-05-16 DIAGNOSIS — M48061 Spinal stenosis, lumbar region without neurogenic claudication: Secondary | ICD-10-CM | POA: Insufficient documentation

## 2022-05-16 DIAGNOSIS — G8929 Other chronic pain: Secondary | ICD-10-CM

## 2022-05-16 DIAGNOSIS — Z96653 Presence of artificial knee joint, bilateral: Secondary | ICD-10-CM | POA: Insufficient documentation

## 2022-05-16 DIAGNOSIS — Z950 Presence of cardiac pacemaker: Secondary | ICD-10-CM | POA: Insufficient documentation

## 2022-05-16 LAB — CBC WITH DIFF
BASOPHIL #: <0.1 10*3/uL (ref <=0.20)
BASOPHIL %: 0.4 %
EOSINOPHIL #: 0.23 10*3/uL (ref <=0.50)
EOSINOPHIL %: 4.9 %
HCT: 37.5 % (ref 34.8–46.0)
HGB: 12 g/dL (ref 11.5–16.0)
IMMATURE GRANULOCYTE #: <0.1 10*3/uL (ref <0.10)
IMMATURE GRANULOCYTE %: 0.4 % (ref 0.0–1.0)
LYMPHOCYTE #: 0.6 10*3/uL — ABNORMAL LOW (ref 1.00–4.80)
LYMPHOCYTE %: 12.7 %
MCH: 31 pg (ref 26.0–32.0)
MCHC: 32 g/dL (ref 31.0–35.5)
MCV: 96.9 fL (ref 78.0–100.0)
MONOCYTE #: 0.32 10*3/uL (ref 0.20–1.10)
MONOCYTE %: 6.8 %
MPV: 10.4 fL (ref 8.7–12.5)
NEUTROPHIL #: 3.52 10*3/uL (ref 1.50–7.70)
NEUTROPHIL %: 74.8 %
PLATELETS: 231 10*3/uL (ref 150–400)
RBC: 3.87 10*6/uL (ref 3.85–5.22)
RDW-CV: 14.2 % (ref 11.5–15.5)
WBC: 4.7 10*3/uL (ref 3.7–11.0)

## 2022-05-16 LAB — BASIC METABOLIC PANEL
BUN/CREA RATIO: 12
BUN: 11 mg/dL (ref 8–23)
CALCIUM: 9.3 mg/dL (ref 8.3–10.7)
CHLORIDE: 104 mmol/L (ref 96–106)
CO2 TOTAL: 25 mmol/L (ref 22–30)
CREATININE: 0.95 mg/dL — ABNORMAL HIGH (ref 0.50–0.90)
ESTIMATED GFR: 59 mL/min/{1.73_m2} — ABNORMAL LOW (ref >90)
GLUCOSE: 108 mg/dL (ref 74–109)
POTASSIUM: 3.8 mmol/L (ref 3.2–5.0)
SODIUM: 140 mmol/L (ref 133–144)

## 2022-05-16 LAB — URINALYSIS, MACROSCOPIC
BLOOD: NEGATIVE mg/dL
GLUCOSE: NEGATIVE mg/dL
KETONES: NEGATIVE mg/dL
NITRITE: POSITIVE — AB
PH: 5 (ref 5.0–7.0)
SPECIFIC GRAVITY: 1.026 — ABNORMAL HIGH (ref 1.005–1.025)
UROBILINOGEN: 1 mg/dL

## 2022-05-16 LAB — URINALYSIS, MICROSCOPIC
BACTERIA: 6.5 /hpf — ABNORMAL HIGH (ref <=0)
RBCS: 3.3 /hpf — ABNORMAL HIGH (ref <=3.0)
SQUAMOUS EPITHELIAL CELLS: 5.9 /hpf (ref 0.0–10.0)
TOTAL CASTS: 2 /lpf (ref <=2.0)
WBCS: 2.9 /hpf (ref <=4.0)

## 2022-05-16 MED ORDER — CYCLOBENZAPRINE 5 MG TABLET
5.0000 mg | ORAL_TABLET | Freq: Three times a day (TID) | ORAL | 0 refills | Status: AC | PRN
Start: 2022-05-16 — End: ?

## 2022-05-16 MED ORDER — PREDNISONE 20 MG TABLET
20.0000 mg | ORAL_TABLET | Freq: Two times a day (BID) | ORAL | 0 refills | Status: AC
Start: 2022-05-16 — End: 2022-05-21

## 2022-05-16 NOTE — ED Triage Notes (Signed)
Patient complaining of low back pain more so on the left side that radiates when she moves certain ways. Pt states that it started yesterday but was worse this morning. Pt denies any injury or strenuous lifting. Pt also denies any UTI type symptoms.

## 2022-05-16 NOTE — ED Nurses Note (Signed)
Report given to Carla, RN

## 2022-05-16 NOTE — ED Provider Notes (Signed)
[facility]  ED Primary Provider Note  History of Present Illness   Chief Complaint   Patient presents with    Back Pain     Arrival: The patient arrived by Car  Ana Kennedy is a 87 y.o. female who had concerns including Back Pain.  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  Patient states she woke up with his 2 days ago.  Denies any injury.  Pain is intermittent but much worse certain movements.  She reports she is having some difficulty walking in his using her walker today due to pain.  No loss bowel bladder control, no saddle paresthesia.  No fever or tachycardia.  Patient has had prior back surgery 3 years ago in [place].  She has also had bilateral knee replacements, the right 1 became infected and she was septic at some point and had to have washouts.  Her pacemaker defibrillator then became infected and had to be removed and replaced with pacemaker only.  She is on Keflex and rifampin daily and sees Dr. Mellody Dance.    History Reviewed This Encounter: Medical History  Surgical History  Family History  Social History    Physical Exam   ED Triage Vitals [05/16/22 0846]   BP (Non-Invasive) 133/75   Heart Rate 70   Respiratory Rate 16   Temperature 36.8 C (98.3 F)   SpO2 98 %   Weight 59 kg (130 lb)   Height 1.575 m (5\' 2" )       Constitutional:  87 y.o. female who appears in no distress. Normal color, no cyanosis.   HENT:   Head: Normocephalic and atraumatic.   Mouth/Throat: Oropharynx is clear and moist.   Eyes: EOMI, PERRL   Neck: Trachea midline. Neck supple.  Cardiovascular: RRR, No murmurs, rubs or gallops. Intact distal pulses.  Pulmonary/Chest: BS equal bilaterally. No respiratory distress. No wheezes, rales or chest tenderness.   Abdominal: Bowel sounds present and normal. Abdomen soft, no  tenderness, no rebound and no guarding.  Back:  Patient has notable curvature of the spine; there is tenderness to the lower lumbar spine and paraspinous area, left greater than right.  Patellar reflexes within normal limits.  Patient is able to flex and dorsiflex the feet bilaterally equally.  She is neurovascularly intact distally     Musculoskeletal: No edema, tenderness or deformity.  Skin: warm and dry. No rash, erythema, pallor or cyanosis  Psychiatric: normal mood and affect. Behavior is normal.   Neurological: Patient keenly alert and responsive, easily able to raise eyebrows, facial muscles/expressions symmetric, speaking in fluent sentences, moving all extremities equally and fully, normal gait  Patient Data     Labs Ordered/Reviewed   URINALYSIS, MACROSCOPIC - Abnormal; Notable for the following components:       Result Value    SPECIFIC GRAVITY 1.026 (*)     PROTEIN 1+ (*)     NITRITE Positive (*)     BILIRUBIN 1+ (*)     LEUKOCYTES 1+ (*)     All other components within normal limits   URINALYSIS, MICROSCOPIC - Abnormal; Notable for the following components:    RBCS 3.3 (*)     BACTERIA 6.5 (*)     All other components within normal limits   BASIC METABOLIC PANEL - Abnormal; Notable for the following components:  CREATININE 0.95 (*)     ESTIMATED GFR 59 (*)     All other components within normal limits   CBC WITH DIFF - Abnormal; Notable for the following components:    LYMPHOCYTE # 0.60 (*)     All other components within normal limits   URINALYSIS, MACROSCOPIC AND MICROSCOPIC W/CULTURE REFLEX    Narrative:     The following orders were created for panel order URINALYSIS, MACROSCOPIC AND MICROSCOPIC W/CULTURE REFLEX.  Procedure                               Abnormality         Status                     ---------                               -----------         ------                     URINALYSIS, MACROSCOPIC[[id number]]      Abnormal            Final result               URINALYSIS,  MICROSCOPIC[[id number]]      Abnormal            Final result                 Please view results for these tests on the individual orders.   CBC/DIFF    Narrative:     The following orders were created for panel order CBC/DIFF.  Procedure                               Abnormality         Status                     ---------                               -----------         ------                     CBC WITH DIFF[[id number]]                Abnormal            Final result                 Please view results for these tests on the individual orders.     CT LUMBAR SPINE WO IV CONTRAST   Final Result by Edi, Radresults In (05/09 1119)      1. Very severe degenerative lumbar spondylosis with dextroscoliosis and multilevel spinal canal stenoses. Stenosis is especially severe at L3-4.            Radiologist location ID: [id number]           Medical Decision Making        Medical Decision Making  87 year old female presents with complaints of low back pain left greater than right radiating down left leg for 2 days.  Patient does have a past medical history of chronic  back pain with surgical intervention 3 years ago.  She also has a history of sepsis of the right knee joint and pacemaker which were removed and she now takes Rafampin  and Keflex daily.  X-ray shows extensive degenerative changes as well as spinal stenosis.  No red flags for cauda equina.  Patient has parents of back pain with sciatica.  Will place her on short course of Flexeril 5 mg TID.  Muscle spasm [place] she states she feels like there is a lump just above her left buttock that is tender.  Will also place her on prednisone 40 daily for 5 days.  I have encouraged her to follow up with family doctor on Monday and make an appointment with the neurosurgeon on-call Dr. Lennon Alstrom.  She is also encouraged to take Tylenol as needed.  I have also discussed safety with her daughter and the patient, encouraged walker use while she is on medication.  Her daughter will  be checking on her frequently.  Patient also has positive nitrate of urine but is on Keflex daily.  Will culture the urine, and we can adjust antibiotics as needed.  Differential diagnosis includes chronic low back pain, sciatica, radiculopathy less likely infectious process or cauda equina    Amount and/or Complexity of Data Reviewed  Labs: ordered.  Radiology: ordered. Decision-making details documented in ED Course.      ED Course as of 05/16/22 1141   Thu May 16, 2022   1121 CT LUMBAR SPINE WO IV CONTRAST               Clinical Impression   Back pain with sciatica (Primary)       Disposition: Discharged

## 2022-05-16 NOTE — ED Nurses Note (Signed)
Patient discharged home with family.  AVS reviewed with patient/care giver.  A written copy of the AVS and discharge instructions was given to the patient/care giver.  Questions sufficiently answered as needed.  Patient/care giver encouraged to follow up with PCP as indicated.  In the event of an emergency, patient/care giver instructed to call 911 or go to the nearest emergency room.

## 2022-05-20 ENCOUNTER — Ambulatory Visit: Payer: Medicare Other

## 2022-06-06 ENCOUNTER — Encounter (HOSPITAL_BASED_OUTPATIENT_CLINIC_OR_DEPARTMENT_OTHER): Payer: Self-pay | Admitting: INFECTIOUS DISEASE

## 2022-06-06 ENCOUNTER — Telehealth (HOSPITAL_BASED_OUTPATIENT_CLINIC_OR_DEPARTMENT_OTHER): Payer: Self-pay | Admitting: INFECTIOUS DISEASE

## 2022-06-06 NOTE — Telephone Encounter (Signed)
Daughter of patient called requesting an immediate refill of Rifampin for Ana Kennedy. I explained that Dr. Mellody Dance is out of the office for the rest of week and will not be able to do that. I am not authorized to do it either. I called her PCP asking if they would be willing to fill it for a week or two until Dr. Mellody Dance was able to get back and speak with Shardae and they would not be able to fill it without an appointment and they are booked out till July.

## 2022-06-20 ENCOUNTER — Ambulatory Visit: Payer: Medicare Other | Attending: INFECTIOUS DISEASE | Admitting: INFECTIOUS DISEASE

## 2022-06-20 ENCOUNTER — Other Ambulatory Visit: Payer: Self-pay

## 2022-06-20 DIAGNOSIS — A4901 Methicillin susceptible Staphylococcus aureus infection, unspecified site: Secondary | ICD-10-CM

## 2022-06-20 DIAGNOSIS — Z95810 Presence of automatic (implantable) cardiac defibrillator: Secondary | ICD-10-CM

## 2022-06-20 MED ORDER — RIFAMPIN 300 MG CAPSULE
300.0000 mg | ORAL_CAPSULE | Freq: Two times a day (BID) | ORAL | 3 refills | Status: DC
Start: 2022-06-20 — End: 2022-09-11

## 2022-06-20 MED ORDER — CEPHALEXIN 500 MG CAPSULE
500.0000 mg | ORAL_CAPSULE | Freq: Two times a day (BID) | ORAL | 3 refills | Status: DC
Start: 2022-06-20 — End: 2023-07-09

## 2022-06-20 MED ORDER — CEPHALEXIN 500 MG CAPSULE
500.0000 mg | ORAL_CAPSULE | Freq: Two times a day (BID) | ORAL | 3 refills | Status: DC
Start: 2022-06-20 — End: 2022-09-11

## 2022-06-20 MED ORDER — RIFAMPIN 300 MG CAPSULE
300.0000 mg | ORAL_CAPSULE | Freq: Two times a day (BID) | ORAL | 3 refills | Status: DC
Start: 2022-06-20 — End: 2023-07-09

## 2022-06-20 NOTE — Progress Notes (Signed)
INFECTIOUS DISEASES, MEDICAL OFFICE [facility]  [address]  [place] [address]  Operated by [facility]  Telephone Visit    Name:  Ana Kennedy MRN: [id number]   Date:  06/20/2022 Age:   87 y.o.     The patient/family initiated a request for telephone service.  Verbal consent for this service was obtained from the patient/family.    Last office visit in this department: Visit date not found      Reason for call:  Seen refills  Call notes:  87 years old lady with history of recurrent MSSA bloodstream infection and history of ICD and prosthetic knees, who was managed by her providers in [place] and maintained on lifelong suppression with Keflex and rifampin.  Patient is calling for request to refill her suppressive antibiotics.  Patient denies any complaints from ID perspective and she follows with primary care physician for her other needs.    Review of Systems:  Review of Systems   HENT: Negative.     Eyes: Negative.    Respiratory: Negative.     Cardiovascular: Negative.    Gastrointestinal: Negative.    Genitourinary: Negative.    Musculoskeletal: Negative.    Skin: Negative.    Neurological: Negative.    Endo/Heme/Allergies: Negative.    Psychiatric/Behavioral: Negative.     All other systems reviewed and are negative.        Labs:    Imaging:    Home Medications:  Prior to Admission medications    Medication Sig Start Date End Date Taking? Authorizing Provider   cephalexin (KEFLEX) 500 mg Oral Capsule Take 1 Capsule (500 mg total) by mouth Twice daily 11/27/21   Provider, Historical   cyclobenzaprine (FLEXERIL) 5 mg Oral Tablet Take 1 Tablet (5 mg total) by mouth Three times a day as needed for Muscle spasms 05/16/22  Yes Kiser-Thomasson, Dione Housekeeper, APRN   ENTRESTO 24-26 mg Oral Tablet Take 1 Tablet by mouth Twice daily 12/21/21  Yes Estrella Deeds, NP   levothyroxine (SYNTHROID) 200 mcg Oral Tablet Take 1 Tablet (200 mcg total) by mouth Every morning 10/27/21  Yes Provider,  Historical   metoprolol succinate (TOPROL-XL) 25 mg Oral Tablet Sustained Release 24 hr Take 0.5 Tablets (12.5 mg total) by mouth Once a day 12/21/21  Yes Estrella Deeds, NP   predniSONE (DELTASONE) 20 mg Oral Tablet Take 1 Tablet (20 mg total) by mouth Twice daily for 5 days 05/16/22 05/21/22  Kizzie Furnish, APRN   rifAMPin (RIFADIN) 300 mg Oral Capsule Take 1 Capsule (300 mg total) by mouth Every 12 hours 12/10/21   Provider, Historical        @HOMEHEALTH @         ICD-10-CM    1. Automatic implantable cardiac defibrillator in situ  Z95.810       2. MSSA (methicillin susceptible Staphylococcus aureus) infection  A49.01           Orders Placed This Encounter    cephalexin (KEFLEX) 500 mg Oral Capsule    cephalexin (KEFLEX) 500 mg Oral Capsule    rifAMPin (RIFADIN) 300 mg Oral Capsule    rifAMPin (RIFADIN) 300 mg Oral Capsule       Interval history reviewed and patient denies any complaint.  Refills provided.  Return in about 6 months (around 12/20/2022) for Telephone Visit.   Patient to notify ID office if any new concerns arise from ID perspective before the next follow-up for sooner evaluation.  Deberah Pelton, MD

## 2022-06-22 ENCOUNTER — Other Ambulatory Visit (HOSPITAL_BASED_OUTPATIENT_CLINIC_OR_DEPARTMENT_OTHER): Payer: Self-pay | Admitting: Family Medicine

## 2022-07-16 ENCOUNTER — Ambulatory Visit: Payer: Medicare Other | Attending: INTERVENTIONAL CARDIOLOGY | Admitting: INTERVENTIONAL CARDIOLOGY

## 2022-07-16 ENCOUNTER — Other Ambulatory Visit: Payer: Self-pay

## 2022-07-16 ENCOUNTER — Encounter (INDEPENDENT_AMBULATORY_CARE_PROVIDER_SITE_OTHER): Payer: Self-pay | Admitting: INTERVENTIONAL CARDIOLOGY

## 2022-07-16 VITALS — BP 123/63 | HR 76 | Temp 96.6°F | Resp 16 | Ht 61.0 in | Wt 128.0 lb

## 2022-07-16 DIAGNOSIS — I10 Essential (primary) hypertension: Secondary | ICD-10-CM | POA: Insufficient documentation

## 2022-07-16 DIAGNOSIS — I110 Hypertensive heart disease with heart failure: Secondary | ICD-10-CM

## 2022-07-16 DIAGNOSIS — Z950 Presence of cardiac pacemaker: Secondary | ICD-10-CM

## 2022-07-16 DIAGNOSIS — I4891 Unspecified atrial fibrillation: Secondary | ICD-10-CM | POA: Insufficient documentation

## 2022-07-16 DIAGNOSIS — Z95810 Presence of automatic (implantable) cardiac defibrillator: Secondary | ICD-10-CM | POA: Insufficient documentation

## 2022-07-16 DIAGNOSIS — I5042 Chronic combined systolic (congestive) and diastolic (congestive) heart failure: Secondary | ICD-10-CM | POA: Insufficient documentation

## 2022-07-16 DIAGNOSIS — I428 Other cardiomyopathies: Secondary | ICD-10-CM | POA: Insufficient documentation

## 2022-07-16 LAB — ECG 12 LEAD W/ INTERP (AMB USE ONLY) (MUSE, IN CLINC) (93005/93010)
Atrial Rate: 76 {beats}/min
Calculated P Axis: 85 degrees
Calculated R Axis: -46 degrees
Calculated T Axis: 76 degrees
PR Interval: 156 ms
QRS Duration: 124 ms
QT Interval: 438 ms
QTC Calculation: 492 ms
Ventricular rate: 76 {beats}/min

## 2022-07-16 NOTE — Progress Notes (Signed)
Cardiology [facility], Medical Office Building [place]  [address]  [place] [address]  [phone]    Cardiology  Clinic Note    Name: Ana Kennedy   DOB: [birth year]  [87 y.o. female]   MRN: [id number]       Visit Date: 07/16/2022   Referring: No referring provider defined for this encounter.   PCP: Arn Medal, DO         Chief Complaint: Follow Up (Pt denies any heart issues today. She feels her Pacemaker is pumping harder when laying on her right side. KH )      History of Present Illness   Ana Kennedy is a 87 y.o. White female who presents for regular office follow-up.  She is accompanied by her son-in-law.  Patient had previously lived in [place] and had her cardiac care there.  She has a implantable defibrillator per her report.  There was previous documentation that she had had a device removed after became infected and when it was reimplanted she had a  BIV pacemaker.  Patient appears somewhat forgetful about her medical history.  She denies any chest pain, dyspnea, palpitations, edema or syncope.  Patient in the multiple episodes of sepsis and currently follows with Infectious Disease in is taking antibiotics daily.    Patient Active Problem List    Diagnosis Date Noted    NICM (nonischemic cardiomyopathy) (CMS HCC) 12/21/2021    Chronic combined systolic and diastolic CHF, NYHA class 2 (CMS HCC) 12/21/2021    DOE (dyspnea on exertion) 12/21/2021    Automatic implantable cardiac defibrillator in situ 12/21/2021    Ventricular tachycardia (paroxysmal) (CMS HCC) 12/21/2021    PAF (paroxysmal atrial fibrillation) (CMS HCC) 12/21/2021    Essential hypertension 12/21/2021       Allergies  No Known Allergies    Medications    Current Outpatient Medications:     cephalexin (KEFLEX) 500 mg Oral Capsule, Take 1 Capsule (500 mg total) by mouth Twice daily Indications: 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 (Patient not taking:  Reported on 07/16/2022), Disp: 180 Capsule, Rfl: 3    cephalexin (KEFLEX) 500 mg Oral Capsule, Take 1 Capsule (500 mg total) by mouth Twice daily, Disp: 180 Capsule, Rfl: 3    cyclobenzaprine (FLEXERIL) 5 mg Oral Tablet, Take 1 Tablet (5 mg total) by mouth Three times a day as needed for Muscle spasms (Patient not taking: Reported on 07/16/2022), Disp: 30 Tablet, Rfl: 0    ENTRESTO 24-26 mg Oral Tablet, Take 1 Tablet by mouth Twice daily, Disp: 60 Tablet, Rfl: 3    levothyroxine (SYNTHROID) 200 mcg Oral Tablet, TAKE 1 TABLET BY MOUTH EVERY MORNING, Disp: 90 Tablet, Rfl: 1    metoprolol succinate (TOPROL-XL) 25 mg Oral Tablet Sustained Release 24 hr, Take 0.5 Tablets (12.5 mg total) by mouth Once a day, Disp: 90 Tablet, Rfl: 4    rifAMPin (RIFADIN) 300 mg Oral Capsule, Take 1 Capsule (300 mg total) by mouth Twice daily (Patient not taking: Reported on 07/16/2022), Disp: 180 Capsule, Rfl: 3    rifAMPin (RIFADIN) 300 mg Oral Capsule, Take 1 Capsule (300 mg total) by mouth Every 12 hours Indications: suppression MSSA (Patient taking differently: Take 1 Capsule (300 mg total) by mouth Every 12 hours), Disp: 180 Capsule, Rfl: 3      Review of Systems:  Constitutional: No weight loss or fatigue  Eye: No blurry vision  ENMT: No hearing loss or dizziness  Respiratory: No shortness of breath, congestive heart failure, emphysema, COPD or black lung  Cardiovascular: As per HPI. No claudication or varicose veins  Gastrointestinal: No nausea, reflux, black or bloody bowel movements  Endocrine: No thyroid problems or diabetes  Musculoskeletal: No arthritis, gout or osteoporosis  Integumentary: No rashes, hives, eczema or psoriasis  Neurologic: No strokes or seizures  Psychiatric: No anxiety, depression or insomn      Physical Examination:  BP 123/63 (Site: Left, Patient Position: Sitting, Cuff Size: Adult)   Pulse 76   Temp (!) 35.9 C (96.6 F) (Temporal)   Resp 16   Ht 1.549 m (5\' 1" )   Wt 58 kg (127 lb 15.6 oz)   SpO2 96%    BMI 24.18 kg/m       General: Awake, Alert and oriented, well nourished, no acute distress appropriate to stated age  Eye: Pupils equal and reactive to light and accommodation  HENT: Normocephalic, no sinus tenderness.  Neck: No carotid bruits, No JVD  Lungs: Clear to auscultation, non-labored respiration.  Heart: Normal rate, regular rhythm, no murmur, gallop or edema.  Abdomen: Normal x 4 quadrants, no guarding, rebound or tenderness.  Musculoskeletal: Normal range of motion, strength and grips are equal.  Skin: No rashes, hives, eczema or psoriasis.  Neurologic: Awake, alert, and oriented X3.  Psychiatric: Cooperative, appropriate mood and affect.        ECG:  Atrially sensed ventricularly paced rhythm at a rate of 76 with occasional supraventricular complex      Orders Placed This Encounter    ECG 12 Lead w/ Interp (M[facility] - In Clinic, Same Day)       There are no discontinued medications.    Assessment and Plan:  Assessment/Plan   1. Atrial fibrillation (CMS HCC)    2. NICM (nonischemic cardiomyopathy) (CMS HCC)    3. Chronic combined systolic and diastolic CHF, NYHA class 2 (CMS HCC)        87 year old female with history of nonischemic cardiomyopathy and ICD/pacemaker.  Patient will continue her Entresto and metoprolol succinate.  Will attempt to obtain results of her last echocardiogram which was performed in [place].  Clinically she was doing well from a cardiac standpoint.  Will plan to see the patient in 6 months.  Will refer the patient to EP for her device care.      Follow up:  No follow-ups on file.        Reginia Forts, MD  [facility]  Cardiology  [facility]    A portion of this documentation may have been generated using [facility] voice recognition software and may contain syntax/voice recognition errors.

## 2022-08-19 ENCOUNTER — Ambulatory Visit: Payer: Medicare Other

## 2022-09-02 ENCOUNTER — Encounter (HOSPITAL_BASED_OUTPATIENT_CLINIC_OR_DEPARTMENT_OTHER): Payer: Self-pay | Admitting: Family Medicine

## 2022-09-05 ENCOUNTER — Ambulatory Visit (HOSPITAL_BASED_OUTPATIENT_CLINIC_OR_DEPARTMENT_OTHER): Payer: Self-pay | Admitting: Family Medicine

## 2022-09-08 NOTE — Progress Notes (Signed)
 FAMILY MEDICINE, [facility]  [address]  [place] [address]  Operated by [facility]    Name: Ana Kennedy MRN:  [id number]   Date: 09/11/2022 DOB:  09/06/1935 (87 y.o.)      Provider: Bari DELENA Mires, DO  PCP: Bari DELENA Mires, DO  Referring Provider: No ref. provider found     Reason for visit: Follow Up (Needs to know if she can get a rx for a walker. Has been having more right knee pain. )    History of Present Illness:   Ana Kennedy is a 87 y.o. female     She has mild dementia and lives with her daughter.    She has CHF and afib and is following with Dr. Jodean.  She has pacemaker.  Seen 07/2022.  Also follows with Dr. Dewain EP and seen 05/7973.  Denies chest pain, shortness of breath or edema.   She is on Entresto  and metoprolol  succinate.    She has had narcolepsy for years.  She is no longer on Ritalin.     She has hypothyroidism and is compliant with levothyroxine .    She had colonoscopy with adenomatous colon polyps in the past.  Last c-scope was over 5 years ago.  Her father had colon cancer.  Denies any diarrhea, constipation, black or bloody stools or abdominal pain.  She states that she completed Cologuard.      She has had sepsis multiple times.  Was following with ID and seen 06/2022.  She is still on Rifampin  and Keflex  indefinitely.  She has had problem with knee pain since.  She would like a walker.      She has h/o psoriatic arthritis and Sjogren's Syndrome but no longer takes medication for these conditions.  Doing well.  She had CT lumbar spine 05/2022 that revealed severe degenerative changes and spinal stenosis.    She had PNA vaccine 09/2019.  DTAP 06/2012.  Shingrix 04/2021.     Past Medical History:     Past Medical History:   Diagnosis Date    A-fib (CMS HCC)     Acrodermatitis continua of [place]     Cardiomyopathy, dilated (CMS HCC)     CHF (congestive heart failure) (CMS HCC)     Colon polyps     Esophageal reflux     H/O sepsis      Hypothyroidism     LBBB (left bundle branch block)     Mild cognitive disorder     Mild congestive heart failure (CMS HCC)     MSSA bacteremia     Narcolepsy     Psoriatic arthritis (CMS HCC)     Sepsis (CMS HCC)     Sjogren's syndrome (CMS HCC)            Past Surgical History:     Past Surgical History:   Procedure Laterality Date    COLONOSCOPY      ESOPHAGOGASTRODUODENOSCOPY      HIP ARTHROPLASTY Bilateral     HX APPENDECTOMY      HX CATARACT REMOVAL      HX CHOLECYSTECTOMY      HX HERNIA REPAIR      umbilical    HX HYSTERECTOMY      HX KNEE SURGERY Bilateral     HX LUMBAR FUSION      HX PACEMAKER INSERTION      HX TONSILLECTOMY        Allergies:   No Known Allergies  Medications:     Current Outpatient Medications   Medication Sig    cephalexin  (KEFLEX ) 500 mg Oral Capsule Take 1 Capsule (500 mg total) by mouth Twice daily Indications: 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    cyclobenzaprine  (FLEXERIL ) 5 mg Oral Tablet Take 1 Tablet (5 mg total) by mouth Three times a day as needed for Muscle spasms    ENTRESTO  24-26 mg Oral Tablet Take 1 Tablet by mouth Twice daily    levothyroxine  (SYNTHROID ) 200 mcg Oral Tablet TAKE 1 TABLET BY MOUTH EVERY MORNING    metoprolol  succinate (TOPROL -XL) 25 mg Oral Tablet Sustained Release 24 hr Take 0.5 Tablets (12.5 mg total) by mouth Once a day    rifAMPin  (RIFADIN ) 300 mg Oral Capsule Take 1 Capsule (300 mg total) by mouth Every 12 hours Indications: suppression MSSA (Patient taking differently: Take 1 Capsule (300 mg total) by mouth Every 12 hours)     Family History:     Family Medical History:       Problem Relation (Age of Onset)    Arthritis-osteo Father    Breast Cancer Sister, Maternal Aunt    Colon Cancer Father    Diabetes Father    Heart Disease Mother, Father    Osteoporosis Other            Social History:     Social History     Socioeconomic History    Marital status: Widowed   Tobacco Use    Smoking status: Never    Smokeless  tobacco: Never   Vaping Use    Vaping status: Never Used   Substance and Sexual Activity    Alcohol use: Not Currently    Drug use: Never     Social Determinants of Health     Financial Resource Strain: Low Risk  (09/11/2022)    Financial Resource Strain     SDOH Financial: No   Transportation Needs: Low Risk  (09/11/2022)    Transportation Needs     SDOH Transportation: No   Social Connections: Low Risk  (09/11/2022)    Social Connections     SDOH Social Isolation: 5 or more times a week   Intimate Partner Violence: High Risk (09/11/2022)    Intimate Partner Violence     SDOH Domestic Violence: No   Housing Stability: Low Risk  (09/11/2022)    Housing Stability     SDOH Housing Situation: I have housing.     SDOH Housing Worry: No     Review of Systems:   Review of Systems   Constitutional: Negative.    HENT: Negative.     Eyes: Negative.    Respiratory: Negative.     Cardiovascular: Negative.    Gastrointestinal: Negative.    Genitourinary: Negative.    Musculoskeletal:         See HPI   Skin: Negative.    Neurological: Negative.    Psychiatric/Behavioral: Negative.        Physical Exam:   Vital Signs: BP 121/65   Pulse 84   Temp 36.3 C (97.4 F)   Resp 16   Ht 1.549 m (5' 1)   Wt 58.5 kg (129 lb)   SpO2 95%   BMI 24.37 kg/m        Physical Exam  Constitutional:       Appearance: Normal appearance.   HENT:      Head: Normocephalic and atraumatic.   Eyes:      Extraocular  Movements: Extraocular movements intact.   Cardiovascular:      Rate and Rhythm: Normal rate and regular rhythm.      Pulses: Normal pulses.      Heart sounds: Murmur heard.   Pulmonary:      Breath sounds: Normal breath sounds.   Abdominal:      General: There is no distension.      Palpations: Abdomen is soft.      Tenderness: There is no abdominal tenderness. There is no guarding or rebound.   Musculoskeletal:      Cervical back: Neck supple.      Right lower leg: No edema.      Left lower leg: No edema.   Skin:     General: Skin is warm and  dry.   Neurological:      Mental Status: She is alert.      Gait: Gait abnormal.   Psychiatric:         Mood and Affect: Mood normal.       Assessment:       ICD-10-CM    1. Chronic combined systolic and diastolic CHF, NYHA class 2 (CMS HCC)  I50.42 LIPID PANEL      2. NICM (nonischemic cardiomyopathy) (CMS HCC)  I42.8 LIPID PANEL      3. Atrial fibrillation, unspecified type (CMS HCC)  I48.91       4. Essential hypertension  I10 LIPID PANEL      5. Hypothyroidism, unspecified type  E03.9 THYROID  STIMULATING HORMONE WITH FREE T4 REFLEX     LIPID PANEL      6. Knee pain, unspecified chronicity, unspecified laterality  M25.569 DME - WALKER Seated Walker      7. Mild cognitive disorder  F09          Plan:   Keep appointments with specialists.  Stay active.  Gave order for walker.  Gave order for labs.  Orders Placed This Encounter    THYROID  STIMULATING HORMONE WITH FREE T4 REFLEX    LIPID PANEL    DME - WALKER Seated Walker                     Return in about 1 year (around 09/11/2023).    Josie Burleigh A Buchko, DO

## 2022-09-11 ENCOUNTER — Other Ambulatory Visit: Payer: Self-pay

## 2022-09-11 ENCOUNTER — Encounter (HOSPITAL_BASED_OUTPATIENT_CLINIC_OR_DEPARTMENT_OTHER): Payer: Self-pay | Admitting: Family Medicine

## 2022-09-11 ENCOUNTER — Ambulatory Visit: Payer: Medicare Other | Attending: Family Medicine | Admitting: Family Medicine

## 2022-09-11 VITALS — BP 121/65 | HR 84 | Temp 97.4°F | Resp 16 | Ht 61.0 in | Wt 129.0 lb

## 2022-09-11 DIAGNOSIS — I4891 Unspecified atrial fibrillation: Secondary | ICD-10-CM | POA: Insufficient documentation

## 2022-09-11 DIAGNOSIS — E039 Hypothyroidism, unspecified: Secondary | ICD-10-CM | POA: Insufficient documentation

## 2022-09-11 DIAGNOSIS — M25569 Pain in unspecified knee: Secondary | ICD-10-CM | POA: Insufficient documentation

## 2022-09-11 DIAGNOSIS — I5042 Chronic combined systolic (congestive) and diastolic (congestive) heart failure: Secondary | ICD-10-CM | POA: Insufficient documentation

## 2022-09-11 DIAGNOSIS — I428 Other cardiomyopathies: Secondary | ICD-10-CM | POA: Insufficient documentation

## 2022-09-11 DIAGNOSIS — F09 Unspecified mental disorder due to known physiological condition: Secondary | ICD-10-CM | POA: Insufficient documentation

## 2022-09-11 DIAGNOSIS — I10 Essential (primary) hypertension: Secondary | ICD-10-CM | POA: Insufficient documentation

## 2022-09-11 DIAGNOSIS — I509 Heart failure, unspecified: Secondary | ICD-10-CM | POA: Insufficient documentation

## 2022-09-27 ENCOUNTER — Other Ambulatory Visit (HOSPITAL_BASED_OUTPATIENT_CLINIC_OR_DEPARTMENT_OTHER): Payer: Self-pay | Admitting: Family Medicine

## 2022-09-27 MED ORDER — LEVOTHYROXINE 200 MCG TABLET
200.0000 ug | ORAL_TABLET | Freq: Every morning | ORAL | 4 refills | Status: AC
Start: 2022-09-27 — End: ?

## 2022-10-02 ENCOUNTER — Other Ambulatory Visit (INDEPENDENT_AMBULATORY_CARE_PROVIDER_SITE_OTHER): Payer: Self-pay | Admitting: INTERVENTIONAL CARDIOLOGY

## 2022-10-02 MED ORDER — ENTRESTO 24 MG-26 MG TABLET
1.0000 | ORAL_TABLET | Freq: Two times a day (BID) | ORAL | 3 refills | Status: DC
Start: 2022-10-02 — End: 2022-10-30

## 2022-10-02 NOTE — Telephone Encounter (Signed)
Patient needs a refill on entresto 24-26 mg sent to [facility] in [place] 180 days

## 2022-10-03 ENCOUNTER — Encounter (HOSPITAL_BASED_OUTPATIENT_CLINIC_OR_DEPARTMENT_OTHER): Payer: Self-pay | Admitting: Family Medicine

## 2022-10-10 ENCOUNTER — Other Ambulatory Visit (HOSPITAL_BASED_OUTPATIENT_CLINIC_OR_DEPARTMENT_OTHER): Payer: Self-pay | Admitting: Family Medicine

## 2022-10-10 ENCOUNTER — Encounter (HOSPITAL_BASED_OUTPATIENT_CLINIC_OR_DEPARTMENT_OTHER): Payer: Self-pay | Admitting: Family Medicine

## 2022-10-10 MED ORDER — LEVOTHYROXINE 137 MCG TABLET
137.0000 ug | ORAL_TABLET | Freq: Every morning | ORAL | 1 refills | Status: DC
Start: 2022-10-10 — End: 2023-03-10

## 2022-10-30 ENCOUNTER — Other Ambulatory Visit (INDEPENDENT_AMBULATORY_CARE_PROVIDER_SITE_OTHER): Payer: Self-pay | Admitting: INTERVENTIONAL CARDIOLOGY

## 2022-10-30 MED ORDER — ENTRESTO 24 MG-26 MG TABLET
1.0000 | ORAL_TABLET | Freq: Two times a day (BID) | ORAL | 1 refills | Status: DC
Start: 2022-10-30 — End: 2023-10-14

## 2022-11-18 ENCOUNTER — Ambulatory Visit: Payer: Medicare Other

## 2022-12-24 ENCOUNTER — Ambulatory Visit: Payer: Medicare Other | Attending: INFECTIOUS DISEASE | Admitting: INFECTIOUS DISEASE

## 2022-12-24 ENCOUNTER — Other Ambulatory Visit: Payer: Self-pay

## 2022-12-24 DIAGNOSIS — A4901 Methicillin susceptible Staphylococcus aureus infection, unspecified site: Secondary | ICD-10-CM

## 2022-12-24 MED ORDER — RIFAMPIN 300 MG CAPSULE
300.0000 mg | ORAL_CAPSULE | Freq: Two times a day (BID) | ORAL | 5 refills | Status: AC
Start: 2022-12-24 — End: ?

## 2022-12-24 MED ORDER — CEPHALEXIN 500 MG CAPSULE
500.0000 mg | ORAL_CAPSULE | Freq: Two times a day (BID) | ORAL | 5 refills | Status: AC
Start: 2022-12-24 — End: ?

## 2022-12-24 NOTE — Progress Notes (Signed)
INFECTIOUS DISEASES, [facility]  [address]  [place] [address]  Operated by [facility]  Telephone Visit    Name:  Ana Kennedy MRN: [id number]   Date:  12/24/2022 Age:   87 y.o.     The patient/family initiated a request for telephone service.  Verbal consent for this service was obtained from the patient/family.    Last office visit in this department: Visit date not found      Reason for call:  Interval follow-up and refills  Call notes:  Patient reported for interval follow-up.  Denied any complaints from ID perspective.  Patient receives care in the past in [place] for history of MSSA bloodstream infection ICD and prosthetic joints.  Patient was placed on suppressive Keflex and rifampin by her providers and continue to follow in this clinic for refill.  Currently no new complaints reported labs were just done through the primary care service and were normal.  Asking for 90 days refil    Review of Systems:  Review of Systems   All other systems reviewed and are negative.        Labs:    Imaging:    Home Medications:  Prior to Admission medications    Medication Sig Start Date End Date Taking? Authorizing Provider   cephalexin (KEFLEX) 500 mg Oral Capsule Take 1 Capsule (500 mg total) by mouth Twice daily Indications: 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 06/20/22 06/20/23  Deberah Pelton, MD   cyclobenzaprine (FLEXERIL) 5 mg Oral Tablet Take 1 Tablet (5 mg total) by mouth Three times a day as needed for Muscle spasms 05/16/22   Kizzie Furnish, APRN   ENTRESTO 24-26 mg Oral Tablet Take 1 Tablet by mouth Twice daily 10/30/22   Novella Olive, FNP   levothyroxine (SYNTHROID) 137 mcg Oral Tablet Take 1 Tablet (137 mcg total) by mouth Every morning for 180 days 10/10/22 04/08/23  Buchko, Christy A, DO   metoprolol succinate (TOPROL-XL) 25 mg Oral Tablet Sustained Release 24 hr Take 0.5 Tablets (12.5 mg total) by mouth Once a  day 12/21/21   Estrella Deeds, NP   rifAMPin (RIFADIN) 300 mg Oral Capsule Take 1 Capsule (300 mg total) by mouth Every 12 hours Indications: suppression MSSA  Patient taking differently: Take 1 Capsule (300 mg total) by mouth Every 12 hours 06/20/22 06/20/23  Deberah Pelton, MD        @HOMEHEALTH @         ICD-10-CM    1. 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  Obtain labs from PMD to review.  Patient to notify ID office if there are any new ID related issues but reports stability with suppressive treatment.  Continue care per primary and other providers.    No orders of the defined types were placed in this encounter.        No follow-ups on file.     Deberah Pelton, MD

## 2023-01-10 ENCOUNTER — Other Ambulatory Visit: Payer: Self-pay

## 2023-02-17 ENCOUNTER — Ambulatory Visit: Payer: Medicare Other

## 2023-02-24 ENCOUNTER — Ambulatory Visit (INDEPENDENT_AMBULATORY_CARE_PROVIDER_SITE_OTHER): Payer: Self-pay | Admitting: INTERVENTIONAL CARDIOLOGY

## 2023-03-10 ENCOUNTER — Ambulatory Visit: Payer: Self-pay | Attending: FAMILY PRACTICE | Admitting: FAMILY PRACTICE

## 2023-03-10 ENCOUNTER — Encounter (HOSPITAL_BASED_OUTPATIENT_CLINIC_OR_DEPARTMENT_OTHER): Payer: Self-pay | Admitting: FAMILY PRACTICE

## 2023-03-10 ENCOUNTER — Other Ambulatory Visit: Payer: Self-pay

## 2023-03-10 VITALS — BP 136/65 | HR 76 | Temp 97.2°F | Ht 61.0 in | Wt 133.4 lb

## 2023-03-10 DIAGNOSIS — M545 Low back pain, unspecified: Secondary | ICD-10-CM | POA: Insufficient documentation

## 2023-03-10 DIAGNOSIS — E039 Hypothyroidism, unspecified: Secondary | ICD-10-CM | POA: Insufficient documentation

## 2023-03-10 DIAGNOSIS — G3184 Mild cognitive impairment, so stated: Secondary | ICD-10-CM

## 2023-03-10 DIAGNOSIS — F09 Unspecified mental disorder due to known physiological condition: Secondary | ICD-10-CM | POA: Insufficient documentation

## 2023-03-10 DIAGNOSIS — Z79899 Other long term (current) drug therapy: Secondary | ICD-10-CM

## 2023-03-10 DIAGNOSIS — M3500 Sicca syndrome, unspecified: Secondary | ICD-10-CM | POA: Insufficient documentation

## 2023-03-10 DIAGNOSIS — I5042 Chronic combined systolic (congestive) and diastolic (congestive) heart failure: Secondary | ICD-10-CM | POA: Insufficient documentation

## 2023-03-10 LAB — COMPREHENSIVE METABOLIC PANEL, NON-FASTING
ALBUMIN: 4.1 g/dL (ref 3.5–5.2)
ALKALINE PHOSPHATASE: 77 U/L (ref 35–129)
ALT (SGPT): 11 U/L (ref 0–33)
ANION GAP: 11 mmol/L
AST (SGOT): 19 U/L (ref 0–32)
BILIRUBIN TOTAL: 0.3 mg/dL (ref 0.2–1.2)
BUN: 20 mg/dL (ref 8–23)
CALCIUM: 9.6 mg/dL (ref 8.3–10.7)
CHLORIDE: 105 mmol/L (ref 96–106)
CO2 TOTAL: 25 mmol/L (ref 22–30)
CREATININE: 1.07 mg/dL — ABNORMAL HIGH (ref 0.50–0.90)
ESTIMATED GFR: 51 mL/min/{1.73_m2} — ABNORMAL LOW (ref >90)
GLUCOSE: 93 mg/dL (ref 74–109)
POTASSIUM: 4.5 mmol/L (ref 3.2–5.0)
PROTEIN TOTAL: 7.7 g/dL (ref 6.4–8.3)
SODIUM: 141 mmol/L (ref 133–144)

## 2023-03-10 LAB — THYROID STIMULATING HORMONE WITH FREE T4 REFLEX: TSH: 0.8 u[IU]/mL (ref 0.270–4.200)

## 2023-03-10 LAB — LIPID PANEL
CHOLESTEROL: 159 mg/dL (ref 0–200)
HDL CHOL: 52 mg/dL
LDL CALC: 94 mg/dL (ref 0–99)
TRIGLYCERIDES: 63 mg/dL (ref 0–150)
VLDL CALC: 13 mg/dL (ref 0–30)

## 2023-03-10 MED ORDER — CYCLOBENZAPRINE 5 MG TABLET
5.0000 mg | ORAL_TABLET | Freq: Three times a day (TID) | ORAL | 0 refills | Status: DC | PRN
Start: 2023-03-10 — End: 2023-07-09

## 2023-03-10 MED ORDER — LEVOTHYROXINE 137 MCG TABLET: 137.0000 ug | ORAL_TABLET | Freq: Every morning | ORAL | 1 refills | Status: DC

## 2023-03-18 ENCOUNTER — Ambulatory Visit (HOSPITAL_BASED_OUTPATIENT_CLINIC_OR_DEPARTMENT_OTHER): Payer: Self-pay | Admitting: FAMILY PRACTICE

## 2023-03-21 NOTE — Progress Notes (Signed)
 FAMILY MEDICINE, [facility]  [address]  [place] [address]  Operated by [facility]  History and Physical    Name: Ana Kennedy MRN:  [id number]   Date: 03/10/2023 DOB:  May 07, 1935 (88 y.o.)            Discussion:    Assessment/Plan:  Ana Kennedy was seen today for new patient.    Diagnoses and all orders for this visit:    Mild cognitive disorder    Chronic combined systolic and diastolic CHF, NYHA class 2 (CMS HCC)  -     LIPID PANEL; Future  -     COMPREHENSIVE METABOLIC PANEL, NON-FASTING; Future  -     LIPID PANEL  -     COMPREHENSIVE METABOLIC PANEL, NON-FASTING    Lumbago    Hypothyroidism  -     THYROID STIMULATING HORMONE WITH FREE T4 REFLEX; Future  -     THYROID STIMULATING HORMONE WITH FREE T4 REFLEX    Sjogren syndrome    Other orders  -     levothyroxine (SYNTHROID) 137 mcg Oral Tablet; Take 1 Tablet (137 mcg total) by mouth Every morning for 180 days  -     cyclobenzaprine (FLEXERIL) 5 mg Oral Tablet; Take 1 Tablet (5 mg total) by mouth Three times a day as needed for Muscle spasms       Medications Discontinued During This Encounter   Medication Reason    cephalexin (KEFLEX) 500 mg Oral Capsule     metoprolol succinate (TOPROL-XL) 25 mg Oral Tablet Sustained Release 24 hr     rifAMPin (RIFADIN) 300 mg Oral Capsule     cyclobenzaprine (FLEXERIL) 5 mg Oral Tablet Reorder    levothyroxine (SYNTHROID) 137 mcg Oral Tablet Reorder         Reason for Visit: New Patient (New patient)    History of Present Illness  Ana Kennedy is a 88 y.o. female who is being seen today for   Chief Complaint   Patient presents with    New Patient     New patient        (F09) Mild cognitive disorder  (primary encounter diagnosis)  Plan: has been having problems with cognition, not remembering things as well as she used to    (I50.42) Chronic combined systolic and diastolic CHF, NYHA class 2 (CMS HCC)  Plan: LIPID PANEL, COMPREHENSIVE METABOLIC PANEL,          NON-FASTING        On entresto    (M54.50) Lumbago  Plan: has been having back pain, takes cyclobenzaprine occasionally    (E03.9) Hypothyroidism  Plan: THYROID STIMULATING HORMONE WITH FREE T4 REFLEX        Feels like thyroid is ok    (M35.00) Sjogren syndrome  Plan: patient also has sjogren's       Past Medical History:   Diagnosis Date    A-fib (CMS HCC)     Acrodermatitis continua of [place]     Cardiomyopathy, dilated (CMS HCC)     CHF (congestive heart failure) (CMS HCC)     Colon polyps     Esophageal reflux     H/O sepsis     Hypothyroidism     LBBB (left bundle branch block)     Mild cognitive disorder     Mild congestive heart failure (CMS HCC)     MSSA bacteremia     Narcolepsy     Psoriatic arthritis (CMS  HCC)     Sepsis (CMS HCC)     Sjogren's syndrome       Past Surgical History:   Procedure Laterality Date    COLONOSCOPY      ESOPHAGOGASTRODUODENOSCOPY      HIP ARTHROPLASTY Bilateral     HX APPENDECTOMY      HX CATARACT REMOVAL      HX CHOLECYSTECTOMY      HX HERNIA REPAIR      umbilical    HX HYSTERECTOMY      HX KNEE SURGERY Bilateral     HX LUMBAR FUSION      HX PACEMAKER INSERTION      HX TONSILLECTOMY           Family Medical History:       Problem Relation (Age of Onset)    Arthritis-osteo Father    Breast Cancer Sister, Maternal Aunt    Colon Cancer Father    Diabetes Father    Heart Disease Mother, Father    Osteoporosis Other            Social History     Tobacco Use    Smoking status: Never    Smokeless tobacco: Never   Vaping Use    Vaping status: Never Used   Substance Use Topics    Alcohol use: Not Currently    Drug use: Never       Review of Systems  Review of Systems   Constitutional:  Negative for chills and fever.   Respiratory:  Negative for shortness of breath.    Cardiovascular:  Negative for chest pain.        Medication:  cephalexin (KEFLEX) 500 mg Oral Capsule, Take 1 Capsule (500 mg total) by mouth Twice daily Indications: Suppressive for recurrent MSSA infection status ICD  and knee replacement  ENTRESTO 24-26 mg Oral Tablet, Take 1 Tablet by mouth Twice daily  rifAMPin (RIFADIN) 300 mg Oral Capsule, Take 1 Capsule (300 mg total) by mouth Every 12 hours Indications: suppression MSSA  cephalexin (KEFLEX) 500 mg Oral Capsule, Take 1 Capsule (500 mg total) by mouth Twice daily  cyclobenzaprine (FLEXERIL) 5 mg Oral Tablet, Take 1 Tablet (5 mg total) by mouth Three times a day as needed for Muscle spasms  levothyroxine (SYNTHROID) 137 mcg Oral Tablet, Take 1 Tablet (137 mcg total) by mouth Every morning for 180 days  metoprolol succinate (TOPROL-XL) 25 mg Oral Tablet Sustained Release 24 hr, Take 0.5 Tablets (12.5 mg total) by mouth Once a day (Patient not taking: Reported on 03/10/2023)  rifAMPin (RIFADIN) 300 mg Oral Capsule, Take 1 Capsule (300 mg total) by mouth Twice daily    No facility-administered medications prior to visit.    Allergies:No Known Allergies    Physical Exam:  Vitals:    03/10/23 1132   BP: 136/65   Pulse: 76   Temp: 36.2 C (97.2 F)   SpO2: 99%   Weight: 60.5 kg (133 lb 6.4 oz)   Height: 1.549 m (5\' 1" )   BMI: 25.21      Physical Exam  Constitutional:       General: She is not in acute distress.     Appearance: Normal appearance.   Cardiovascular:      Rate and Rhythm: Normal rate and regular rhythm.      Heart sounds: No murmur heard.  Pulmonary:      Breath sounds: Normal breath sounds. No wheezing, rhonchi or rales.  Reviewed family history, medication history, social history, and allergies with the patient today.   Follow up: No follow-ups on file.  Seek medical attention for new or worsening symptoms.  Medication list was confirmed with the patient and changes reflected in the medical record.  Discussed potential side effects as well as requirements of monitoring for any medications that were changed or added today.    Eddie Dibbles, MD          This note was partially created using [employer] Direct system (voice recognition software) and  is inherently subject to errors including those of syntax and "sound-alike" substitutions which may escape proofreading.  In such instances, original meaning may be extrapolated by contextual derivation.

## 2023-04-09 ENCOUNTER — Ambulatory Visit: Payer: Self-pay

## 2023-04-09 ENCOUNTER — Encounter (INDEPENDENT_AMBULATORY_CARE_PROVIDER_SITE_OTHER): Payer: Self-pay

## 2023-04-09 ENCOUNTER — Other Ambulatory Visit: Payer: Self-pay

## 2023-04-09 VITALS — BP 137/66 | HR 66 | Temp 97.3°F | Resp 16 | Ht 61.0 in | Wt 132.0 lb

## 2023-04-09 DIAGNOSIS — I480 Paroxysmal atrial fibrillation: Secondary | ICD-10-CM | POA: Insufficient documentation

## 2023-04-09 DIAGNOSIS — I10 Essential (primary) hypertension: Secondary | ICD-10-CM | POA: Insufficient documentation

## 2023-04-09 DIAGNOSIS — Z95810 Presence of automatic (implantable) cardiac defibrillator: Secondary | ICD-10-CM | POA: Insufficient documentation

## 2023-04-09 DIAGNOSIS — I428 Other cardiomyopathies: Secondary | ICD-10-CM | POA: Insufficient documentation

## 2023-04-09 MED ORDER — METOPROLOL SUCCINATE ER 25 MG TABLET,EXTENDED RELEASE 24 HR
12.5000 mg | ORAL_TABLET | Freq: Every day | ORAL | 4 refills | Status: AC
Start: 2023-04-09 — End: ?

## 2023-04-09 NOTE — Progress Notes (Signed)
 Cardiology [facility], Medical Office Building [place]  [address]  Ana Kennedy [address]  [phone]    Cardiology  Clinic Note    Name: Ana Kennedy   DOB: [birth year]  [88 y.o. female]   MRN: [id number]       Visit Date: 04/09/2023   Referring: No referring provider defined for this encounter.   PCP: Pamla Boettcher, MD         Chief Complaint: Follow Up 6 Months      History of Present Illness   Ana Kennedy is a 88 y.o. White female who presents for follow up. She is a patient of Dr. Dolores Freud.     She is accompanied by her son-in-law.  Patient had previously lived in [place]  and had her cardiac care there.     She has a implantable defibrillator per her report. She is now following with Dr. Courtney Ditto at [facility].   There was previous documentation that she had had a device removed after became infected and when it was reimplanted she had a  BIV pacemaker.     Patient appears somewhat forgetful about her medical history.    She denies any dyspnea, palpitations, edema or syncope. She tells me to day that she has had some brief episodes of sharp chest pain that awaken her from sleep. She states this has not happened for three months. There are no other associated symptoms that occur when she has this discomfort. She denies any exertional chest pain or discomfort.     Patient in the multiple episodes of sepsis and currently follows with Infectious Disease in is taking antibiotics daily.       Patient Active Problem List    Diagnosis Date Noted    Lumbago 03/10/2023    Sjogren syndrome 03/10/2023    Hypothyroidism     Mild cognitive disorder     NICM (nonischemic cardiomyopathy) (CMS HCC) 12/21/2021    Chronic combined systolic and diastolic CHF, NYHA class 2 (CMS HCC) 12/21/2021    DOE (dyspnea on exertion) 12/21/2021    Automatic implantable cardiac defibrillator in situ 12/21/2021    Ventricular tachycardia (paroxysmal) 12/21/2021    PAF (paroxysmal atrial fibrillation)  (CMS HCC) 12/21/2021    Essential hypertension 12/21/2021       Allergies  No Known Allergies    Medications    Current Outpatient Medications:     cephalexin  (KEFLEX ) 500 mg Oral Capsule, Take 1 Capsule (500 mg total) by mouth Twice daily Indications: 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, Disp: 180 Capsule, Rfl: 3    cyclobenzaprine  (FLEXERIL ) 5 mg Oral Tablet, Take 1 Tablet (5 mg total) by mouth Three times a day as needed for Muscle spasms, Disp: 30 Tablet, Rfl: 0    ENTRESTO  24-26 mg Oral Tablet, Take 1 Tablet by mouth Twice daily, Disp: 180 Tablet, Rfl: 1    levothyroxine  (SYNTHROID) 137 mcg Oral Tablet, Take 1 Tablet (137 mcg total) by mouth Every morning for 180 days, Disp: 90 Tablet, Rfl: 1    metoprolol  succinate (TOPROL -XL) 25 mg Oral Tablet Sustained Release 24 hr, Take 0.5 Tablets (12.5 mg total) by mouth Daily, Disp: 45 Tablet, Rfl: 4    rifAMPin  (RIFADIN ) 300 mg Oral Capsule, Take 1 Capsule (300 mg total) by mouth Every 12 hours Indications: suppression MSSA, Disp: 180 Capsule, Rfl: 3    History  Past Medical History:   Diagnosis Date    A-fib  Acrodermatitis continua of [place]     Cardiomyopathy, dilated (CMS HCC)     CHF (congestive heart failure)     Colon polyps     Esophageal reflux     H/O sepsis     Hypothyroidism     LBBB (left bundle branch block)     Mild cognitive disorder     Mild congestive heart failure (CMS HCC)     MSSA bacteremia     Narcolepsy     Psoriatic arthritis (CMS HCC)     Sepsis     Sjogren's syndrome          Past Surgical History:   Procedure Laterality Date    COLONOSCOPY      ESOPHAGOGASTRODUODENOSCOPY      HIP ARTHROPLASTY Bilateral     HX APPENDECTOMY      HX CATARACT REMOVAL      HX CHOLECYSTECTOMY      HX HERNIA REPAIR      umbilical    HX HYSTERECTOMY      HX KNEE SURGERY Bilateral     HX LUMBAR FUSION      HX PACEMAKER INSERTION      HX TONSILLECTOMY       Social History     Socioeconomic History    Marital status: Widowed    Tobacco Use    Smoking status: Never    Smokeless tobacco: Never   Vaping Use    Vaping status: Never Used   Substance and Sexual Activity    Alcohol use: Not Currently    Drug use: Never     Social Determinants of Health     Financial Resource Strain: Low Risk  (09/11/2022)    Financial Resource Strain     SDOH Financial: No   Transportation Needs: Low Risk  (09/11/2022)    Transportation Needs     SDOH Transportation: No   Social Connections: Low Risk  (09/11/2022)    Social Connections     SDOH Social Isolation: 5 or more times a week   Intimate Partner Violence: High Risk (09/11/2022)    Intimate Partner Violence     SDOH Domestic Violence: No   Housing Stability: Low Risk  (09/11/2022)    Housing Stability     SDOH Housing Situation: I have housing.     SDOH Housing Worry: No     Family Medical History:       Problem Relation (Age of Onset)    Arthritis-osteo Father    Breast Cancer Sister, Maternal Aunt    Colon Cancer Father    Diabetes Father    Heart Disease Mother, Father    Osteoporosis Other              Review of Systems:  General: Denies fever or chills. No fatigue.   CNS: Denies syncope or dizziness. No headache.  Eyes: Denies recent visual changes. No pain.  Ears, Nose, and Throat: Denies ear pain, nasal congestion, or sore throat.  Endocrine: Denies excessive hunger or thirst. No heat/cold intolerance.  Respiratory: Denies shortness of breath. No cough.  Cardiovascular: Per HPI Denies claudication or varicose veins.  GI: Denies nausea/vomiting. No black/bloody bowel movements.  GU: Denies hematuria or dysuria. No incontinence.  Musculoskeletal: Denies joint swelling or decreased ROM.  Skin: No rashes, hives, or eczema.  Psychiatric: Denies anxiety, depression, or insomnia.  Hematologic: No bleedings disorders or swollen nodes.    All other systems reviewed and either negative or not pertinent.  Physical Examination:  BP 137/66 (Site: Left Arm, Patient Position: Sitting, Cuff Size: Adult)   Pulse 66    Temp 36.3 C (97.3 F)   Resp 16   Ht 1.549 m (5\' 1" )   Wt 59.9 kg (132 lb)   SpO2 98%   BMI 24.94 kg/m       General: Awake. Well nourished. No acute distress. A&O x 3.  HEENT: Normocephalic, atraumatic. PERRL. Normal hearing.  Neck: No JVD or bruit. Trachea midline.  Lungs: Clear to auscultation. No rhonchi or wheezing noted. Unlabored.  Heart: Regular rate and rhythm. No murmur, rub, or gallop.  Abdomen: BS normal x 4 quadrants. Soft, non-tender, non-distended.  Extremities: No clubbing or cyanosis. No edema.  Psychiatric: Cooperative, appropriate mood and affect.  Musculoskeletal: Strength grips are equal. No red, swollen joints. MAE.  Skin: Warm, dry, and intact. No rashes or hives are noted.       Diagnostics  ECG:  To be reviewed by Dr Dolores Freud        Orders Placed This Encounter    ECG 12 Lead w/ Interp (MUSE  - In Clinic, Same Day)    metoprolol  succinate (TOPROL -XL) 25 mg Oral Tablet Sustained Release 24 hr       There are no discontinued medications.    Assessment and Plan:  Assessment/Plan   1. NICM (nonischemic cardiomyopathy) (CMS HCC)    2. Automatic implantable cardiac defibrillator in situ    3. Essential hypertension    4. PAF (paroxysmal atrial fibrillation) (CMS HCC)        PLAN:   Patient is doing well. She has no sustained chest pain that is consistent with true angina. Will continue to monitor her symptoms. She has not been taking her Metoprolol . Will resend her prescription today. She is also taking Entresto  as part of her GDMT. She has no apparent fluid volume overload. She is following with [facility] for her device management and it was felt per review of her device inteorgation that she is having any sustained episodes  of Afib.       Follow up:  Return in about 6 months (around 10/09/2023).      The patient was given the opportunity to ask questions and those questions were answered to the patient's satisfaction. The patient was encouraged to call with any additional questions or  concerns. Discussed with the effects and side effects of medications. Medication safety was discussed. The patient was informed to contact the office within 7 business days if a message/lab results/referral/imaging results have not been conveyed to the patient.       Lunette Saliva, NP  [facility]  Cardiology  [facility] Medicine    A portion of this documentation may have been generated using [employer] voice recognition software and may contain syntax/voice recognition errors.

## 2023-04-11 DIAGNOSIS — Z950 Presence of cardiac pacemaker: Secondary | ICD-10-CM

## 2023-04-11 LAB — ECG 12 LEAD W/ INTERP (AMB USE ONLY) (MUSE, IN CLINC) (93005/93010)
Atrial Rate: 66 {beats}/min
Calculated P Axis: 83 degrees
Calculated R Axis: -51 degrees
Calculated T Axis: 9 degrees
PR Interval: 172 ms
QRS Duration: 122 ms
QT Interval: 468 ms
QTC Calculation: 490 ms
Ventricular rate: 66 {beats}/min

## 2023-05-19 ENCOUNTER — Ambulatory Visit: Payer: Medicare Other

## 2023-06-17 ENCOUNTER — Telehealth: Payer: Medicare Other | Admitting: INFECTIOUS DISEASE

## 2023-06-23 ENCOUNTER — Telehealth (HOSPITAL_BASED_OUTPATIENT_CLINIC_OR_DEPARTMENT_OTHER): Payer: Self-pay | Admitting: FAMILY PRACTICE

## 2023-06-23 NOTE — Telephone Encounter (Signed)
 Brian Campanile, daughter called said they are taking her to urgent care because where she had a knee replacement several years ago is leaking.

## 2023-06-24 ENCOUNTER — Other Ambulatory Visit: Payer: Self-pay

## 2023-06-24 ENCOUNTER — Ambulatory Visit: Payer: Self-pay | Attending: FAMILY PRACTICE | Admitting: FAMILY PRACTICE

## 2023-06-24 ENCOUNTER — Encounter (HOSPITAL_BASED_OUTPATIENT_CLINIC_OR_DEPARTMENT_OTHER): Payer: Self-pay | Admitting: FAMILY PRACTICE

## 2023-06-24 VITALS — BP 155/72 | HR 78 | Temp 97.3°F | Ht 61.0 in | Wt 139.0 lb

## 2023-06-24 DIAGNOSIS — M009 Pyogenic arthritis, unspecified: Secondary | ICD-10-CM | POA: Insufficient documentation

## 2023-06-24 DIAGNOSIS — I890 Lymphedema, not elsewhere classified: Secondary | ICD-10-CM | POA: Insufficient documentation

## 2023-06-24 NOTE — Progress Notes (Signed)
 FAMILY MEDICINE, [facility]  [address]  [place] [address]  Operated by [facility]  History and Physical    Name: Ana Kennedy MRN:  [id number]   Date: 06/24/2023 DOB:  [birth year] (88 y.o.)            Discussion:    Assessment/Plan:  Ana Kennedy was seen today for follow up.    Diagnoses and all orders for this visit:    Septic arthritis of knee, right  -     Referral to ORTHOPEDICSAndy Bannister; Future    Lymphedema of right lower extremity  -     Referral to PHYSICAL THERAPY - Andy Bannister; Future       There are no discontinued medications.      Reason for Visit: Follow Up (Sore on R leg. )    History of Present Illness  Ana Kennedy is a 88 y.o. female who is being seen today for   Chief Complaint   Patient presents with    Follow Up     Sore on R leg.         Is in an assisted living facility and has had stuff leaking out of her leg.  Also having swelling in her right leg    Has had septic arthritis in her right knee, is on chronic cephalexin  and rifampin .    Also has swelling in her right lower leg.         Past Medical History:   Diagnosis Date    A-fib     Acrodermatitis continua of [place]     Cardiomyopathy, dilated (CMS HCC)     CHF (congestive heart failure)     Colon polyps     Esophageal reflux     H/O sepsis     Hypothyroidism     LBBB (left bundle branch block)     Mild cognitive disorder     Mild congestive heart failure (CMS HCC)     MSSA bacteremia     Narcolepsy     Psoriatic arthritis (CMS HCC)     Sepsis     Sjogren's syndrome       Past Surgical History:   Procedure Laterality Date    COLONOSCOPY      ESOPHAGOGASTRODUODENOSCOPY      HIP ARTHROPLASTY Bilateral     HX APPENDECTOMY      HX CATARACT REMOVAL      HX CHOLECYSTECTOMY      HX HERNIA REPAIR      umbilical    HX HYSTERECTOMY      HX KNEE SURGERY Bilateral     HX LUMBAR FUSION      HX PACEMAKER INSERTION      HX TONSILLECTOMY           Family Medical History:       Problem Relation (Age of  Onset)    Arthritis-osteo Father    Breast Cancer Sister, Maternal Aunt    Colon Cancer Father    Diabetes Father    Heart Disease Mother, Father    Osteoporosis Other            Social History[1]    Review of Systems  Review of Systems   Constitutional:  Negative for chills and fever.   Respiratory:  Negative for shortness of breath.    Cardiovascular:  Negative for chest pain.        Medication:  cyclobenzaprine  (FLEXERIL ) 5 mg Oral Tablet, Take 1  Tablet (5 mg total) by mouth Three times a day as needed for Muscle spasms  ENTRESTO  24-26 mg Oral Tablet, Take 1 Tablet by mouth Twice daily  levothyroxine  (SYNTHROID) 137 mcg Oral Tablet, Take 1 Tablet (137 mcg total) by mouth Every morning for 180 days  metoprolol  succinate (TOPROL -XL) 25 mg Oral Tablet Sustained Release 24 hr, Take 0.5 Tablets (12.5 mg total) by mouth Daily    No facility-administered medications prior to visit.    Allergies:Allergies[2]    Physical Exam:  Vitals:    06/24/23 1545 06/24/23 1547   BP: (!) 145/74 (!) 155/72   Pulse: 76 78   Temp: 36.3 C (97.3 F)    SpO2: 95%    Weight: 63 kg (139 lb)    Height: 1.549 m (5' 1)    BMI: 26.26       Physical Exam  Constitutional:       General: She is not in acute distress.     Appearance: Normal appearance.   Cardiovascular:      Rate and Rhythm: Normal rate and regular rhythm.      Heart sounds: No murmur heard.  Pulmonary:      Breath sounds: Normal breath sounds. No wheezing, rhonchi or rales.   Musculoskeletal:      Right lower leg: Edema present.        Legs:       Comments: Draining nodule inferior to the right patella   Neurological:      Mental Status: She is oriented to person, place, and time.              Reviewed family history, medication history, social history, and allergies with the patient today.   Follow up: No follow-ups on file.  Seek medical attention for new or worsening symptoms.  Medication list was confirmed with the patient and changes reflected in the medical record.   Discussed potential side effects as well as requirements of monitoring for any medications that were changed or added today.    Pamla Boettcher, MD          This note was partially created using [employer] system (voice recognition software) and is inherently subject to errors including those of syntax and sound-alike substitutions which may escape proofreading.  In such instances, original meaning may be extrapolated by contextual derivation.       [1]   Social History  Tobacco Use    Smoking status: Never    Smokeless tobacco: Never   Vaping Use    Vaping status: Never Used   Substance Use Topics    Alcohol use: Not Currently    Drug use: Never   [2] No Known Allergies

## 2023-07-02 ENCOUNTER — Inpatient Hospital Stay
Admission: EM | Admit: 2023-07-02 | Discharge: 2023-07-09 | DRG: 560 | Disposition: A | Discharge status: Home Health | Source: Ambulatory Visit

## 2023-07-02 ENCOUNTER — Inpatient Hospital Stay (HOSPITAL_COMMUNITY): Admitting: INTERNAL MEDICINE

## 2023-07-02 ENCOUNTER — Other Ambulatory Visit: Payer: Self-pay

## 2023-07-02 ENCOUNTER — Inpatient Hospital Stay (HOSPITAL_COMMUNITY)

## 2023-07-02 DIAGNOSIS — T8453XA Infection and inflammatory reaction due to internal right knee prosthesis, initial encounter: Principal | ICD-10-CM | POA: Diagnosis present

## 2023-07-02 DIAGNOSIS — R7881 Bacteremia: Secondary | ICD-10-CM | POA: Diagnosis present

## 2023-07-02 DIAGNOSIS — M3500 Sicca syndrome, unspecified: Secondary | ICD-10-CM | POA: Diagnosis present

## 2023-07-02 DIAGNOSIS — I447 Left bundle-branch block, unspecified: Secondary | ICD-10-CM

## 2023-07-02 DIAGNOSIS — Z981 Arthrodesis status: Secondary | ICD-10-CM

## 2023-07-02 DIAGNOSIS — Z95810 Presence of automatic (implantable) cardiac defibrillator: Secondary | ICD-10-CM | POA: Diagnosis present

## 2023-07-02 DIAGNOSIS — T8130XA Disruption of wound, unspecified, initial encounter: Secondary | ICD-10-CM | POA: Diagnosis present

## 2023-07-02 DIAGNOSIS — I130 Hypertensive heart and chronic kidney disease with heart failure and stage 1 through stage 4 chronic kidney disease, or unspecified chronic kidney disease: Secondary | ICD-10-CM | POA: Diagnosis present

## 2023-07-02 DIAGNOSIS — I428 Other cardiomyopathies: Secondary | ICD-10-CM | POA: Diagnosis present

## 2023-07-02 DIAGNOSIS — L4050 Arthropathic psoriasis, unspecified: Secondary | ICD-10-CM | POA: Diagnosis present

## 2023-07-02 DIAGNOSIS — M25461 Effusion, right knee: Secondary | ICD-10-CM | POA: Diagnosis present

## 2023-07-02 DIAGNOSIS — E039 Hypothyroidism, unspecified: Secondary | ICD-10-CM | POA: Diagnosis present

## 2023-07-02 DIAGNOSIS — M25469 Effusion, unspecified knee: Principal | ICD-10-CM

## 2023-07-02 DIAGNOSIS — Z79899 Other long term (current) drug therapy: Secondary | ICD-10-CM

## 2023-07-02 DIAGNOSIS — I509 Heart failure, unspecified: Secondary | ICD-10-CM

## 2023-07-02 DIAGNOSIS — I480 Paroxysmal atrial fibrillation: Secondary | ICD-10-CM | POA: Diagnosis present

## 2023-07-02 DIAGNOSIS — Z79890 Hormone replacement therapy (postmenopausal): Secondary | ICD-10-CM

## 2023-07-02 DIAGNOSIS — F0280 Dementia in other diseases classified elsewhere without behavioral disturbance: Secondary | ICD-10-CM | POA: Diagnosis present

## 2023-07-02 DIAGNOSIS — N189 Chronic kidney disease, unspecified: Secondary | ICD-10-CM | POA: Diagnosis present

## 2023-07-02 DIAGNOSIS — G300 Alzheimer's disease with early onset: Secondary | ICD-10-CM | POA: Diagnosis present

## 2023-07-02 DIAGNOSIS — B9561 Methicillin susceptible Staphylococcus aureus infection as the cause of diseases classified elsewhere: Secondary | ICD-10-CM | POA: Diagnosis present

## 2023-07-02 DIAGNOSIS — K219 Gastro-esophageal reflux disease without esophagitis: Secondary | ICD-10-CM | POA: Diagnosis present

## 2023-07-02 DIAGNOSIS — Z792 Long term (current) use of antibiotics: Secondary | ICD-10-CM

## 2023-07-02 DIAGNOSIS — M7989 Other specified soft tissue disorders: Secondary | ICD-10-CM

## 2023-07-02 DIAGNOSIS — D631 Anemia in chronic kidney disease: Secondary | ICD-10-CM | POA: Diagnosis present

## 2023-07-02 DIAGNOSIS — B958 Unspecified staphylococcus as the cause of diseases classified elsewhere: Secondary | ICD-10-CM | POA: Diagnosis present

## 2023-07-02 DIAGNOSIS — M009 Pyogenic arthritis, unspecified: Secondary | ICD-10-CM | POA: Diagnosis present

## 2023-07-02 DIAGNOSIS — M00061 Staphylococcal arthritis, right knee: Secondary | ICD-10-CM | POA: Diagnosis present

## 2023-07-02 DIAGNOSIS — I10 Essential (primary) hypertension: Secondary | ICD-10-CM | POA: Diagnosis present

## 2023-07-02 DIAGNOSIS — R7982 Elevated C-reactive protein (CRP): Secondary | ICD-10-CM

## 2023-07-02 DIAGNOSIS — Z8679 Personal history of other diseases of the circulatory system: Secondary | ICD-10-CM

## 2023-07-02 DIAGNOSIS — Z96651 Presence of right artificial knee joint: Secondary | ICD-10-CM | POA: Diagnosis present

## 2023-07-02 DIAGNOSIS — I5042 Chronic combined systolic (congestive) and diastolic (congestive) heart failure: Secondary | ICD-10-CM | POA: Diagnosis present

## 2023-07-02 DIAGNOSIS — B9689 Other specified bacterial agents as the cause of diseases classified elsewhere: Secondary | ICD-10-CM | POA: Diagnosis present

## 2023-07-02 LAB — CBC WITH DIFF
BASOPHIL #: <0.1 10*3/uL (ref <=0.20)
BASOPHIL %: 0.9 %
EOSINOPHIL #: 0.12 10*3/uL (ref <=0.50)
EOSINOPHIL %: 2.6 %
HCT: 34.5 % — ABNORMAL LOW (ref 34.8–46.0)
HGB: 10.7 g/dL — ABNORMAL LOW (ref 11.5–16.0)
IMMATURE GRANULOCYTE #: <0.1 10*3/uL (ref <0.10)
IMMATURE GRANULOCYTE %: 0.4 % (ref 0.0–1.0)
LYMPHOCYTE #: 0.74 10*3/uL — ABNORMAL LOW (ref 1.00–4.80)
LYMPHOCYTE %: 15.9 %
MCH: 30.8 pg (ref 26.0–32.0)
MCHC: 31 g/dL (ref 31.0–35.5)
MCV: 99.4 fL (ref 78.0–100.0)
MONOCYTE #: 0.33 10*3/uL (ref 0.20–1.10)
MONOCYTE %: 7.1 %
MPV: 9.6 fL (ref 8.7–12.5)
NEUTROPHIL #: 3.4 10*3/uL (ref 1.50–7.70)
NEUTROPHIL %: 73.1 %
PLATELETS: 197 10*3/uL (ref 150–400)
RBC: 3.47 10*6/uL — ABNORMAL LOW (ref 3.85–5.22)
RDW-CV: 15.4 % (ref 11.5–15.5)
WBC: 4.7 10*3/uL (ref 3.7–11.0)

## 2023-07-02 LAB — COMPREHENSIVE METABOLIC PANEL, NON-FASTING
ALBUMIN: 4 g/dL (ref 3.5–5.2)
ALKALINE PHOSPHATASE: 65 U/L (ref 35–129)
ALT (SGPT): 8 U/L (ref 0–33)
ANION GAP: 12 mmol/L (ref 7–18)
AST (SGOT): 25 U/L (ref 0–32)
BILIRUBIN TOTAL: 0.4 mg/dL (ref 0.2–1.2)
BUN: 12 mg/dL (ref 8–23)
CALCIUM: 9.2 mg/dL (ref 8.3–10.7)
CHLORIDE: 104 mmol/L (ref 96–106)
CO2 TOTAL: 25 mmol/L (ref 22–30)
CREATININE: 1.04 mg/dL — ABNORMAL HIGH (ref 0.50–0.90)
ESTIMATED GFR: 53 mL/min/{1.73_m2} — ABNORMAL LOW (ref >90)
GLUCOSE: 104 mg/dL (ref 74–109)
POTASSIUM: 3.9 mmol/L (ref 3.2–5.0)
PROTEIN TOTAL: 7.6 g/dL (ref 6.4–8.3)
SODIUM: 141 mmol/L (ref 133–144)

## 2023-07-02 LAB — C-REACTIVE PROTEIN (CRP)
C-REACTIVE PROTEIN (CRP): 1.6 mg/dL — ABNORMAL HIGH (ref 0.0–0.9)
C-REACTIVE PROTEIN (CRP): 1.6 mg/dL — ABNORMAL HIGH (ref 0.0–0.9)

## 2023-07-02 LAB — HGA1C (HEMOGLOBIN A1C WITH EST AVG GLUCOSE)
ESTIMATED AVERAGE GLUCOSE: 108 mg/dL
HEMOGLOBIN A1C: 5.4 % (ref 4.0–6.0)

## 2023-07-02 LAB — NT-PROBNP: NT-PROBNP: 555 pg/mL — ABNORMAL HIGH (ref 0–450)

## 2023-07-02 LAB — LACTIC ACID LEVEL W/ REFLEX FOR LEVEL >2.0: LACTIC ACID: 1 mmol/L (ref 0.5–2.0)

## 2023-07-02 LAB — PROCALCITONIN: PROCALCITONIN: 0.04 ng/mL (ref 0.00–0.50)

## 2023-07-02 LAB — SEDIMENTATION RATE: ERYTHROCYTE SEDIMENTATION RATE (ESR): 61 mm/h — ABNORMAL HIGH (ref 0–20)

## 2023-07-02 MED ORDER — SACUBITRIL 24 MG-VALSARTAN 26 MG TABLET
1.0000 | ORAL_TABLET | Freq: Two times a day (BID) | ORAL | Status: DC
Start: 2023-07-02 — End: 2023-07-09
  Administered 2023-07-02 – 2023-07-09 (×14): 1 via ORAL
  Filled 2023-07-02 (×14): qty 1

## 2023-07-02 MED ORDER — SODIUM CHLORIDE 0.9 % INTRAVENOUS SOLUTION
2.0000 g | Freq: Two times a day (BID) | INTRAVENOUS | Status: DC
Start: 2023-07-02 — End: 2023-07-03
  Administered 2023-07-02: 0 g via INTRAVENOUS
  Administered 2023-07-02: 2 g via INTRAVENOUS
  Administered 2023-07-03: 0 g via INTRAVENOUS
  Administered 2023-07-03: 2 g via INTRAVENOUS
  Filled 2023-07-02 (×2): qty 12.5

## 2023-07-02 MED ORDER — METOPROLOL SUCCINATE ER 25 MG TABLET,EXTENDED RELEASE 24 HR
12.5000 mg | ORAL_TABLET | Freq: Every day | ORAL | Status: DC
Start: 2023-07-02 — End: 2023-07-09
  Administered 2023-07-02 – 2023-07-09 (×8): 12.5 mg via ORAL
  Filled 2023-07-02 (×8): qty 1

## 2023-07-02 MED ORDER — LINEZOLID IN 5% DEXTROSE IN WATER 600 MG/300 ML INTRAVENOUS PIGGYBACK
600.0000 mg | INJECTION | Freq: Two times a day (BID) | INTRAVENOUS | Status: DC
Start: 2023-07-02 — End: 2023-07-03
  Administered 2023-07-02: 0 mg via INTRAVENOUS
  Administered 2023-07-02 – 2023-07-03 (×2): 600 mg via INTRAVENOUS
  Administered 2023-07-03: 0 mg via INTRAVENOUS
  Filled 2023-07-02 (×2): qty 300

## 2023-07-02 MED ORDER — LEVOTHYROXINE 25 MCG TABLET
137.0000 ug | ORAL_TABLET | Freq: Every morning | ORAL | Status: DC
Start: 2023-07-03 — End: 2023-07-09
  Administered 2023-07-03 – 2023-07-09 (×7): 137 ug via ORAL
  Filled 2023-07-02 (×9): qty 1

## 2023-07-02 MED ORDER — HEPARIN (PORCINE) 5,000 UNIT/ML INJECTION SOLUTION
5000.0000 [IU] | Freq: Three times a day (TID) | INTRAMUSCULAR | Status: DC
Start: 2023-07-02 — End: 2023-07-09
  Administered 2023-07-02: 0 [IU] via SUBCUTANEOUS
  Administered 2023-07-02 – 2023-07-03 (×3): 5000 [IU] via SUBCUTANEOUS
  Administered 2023-07-03 – 2023-07-04 (×2): 0 [IU] via SUBCUTANEOUS
  Administered 2023-07-04 – 2023-07-08 (×14): 5000 [IU] via SUBCUTANEOUS
  Administered 2023-07-09: 0 [IU] via SUBCUTANEOUS
  Administered 2023-07-09: 5000 [IU] via SUBCUTANEOUS
  Filled 2023-07-02 (×18): qty 1

## 2023-07-02 MED ORDER — MELATONIN 3 MG TABLET
6.0000 mg | ORAL_TABLET | Freq: Every evening | ORAL | Status: DC | PRN
Start: 2023-07-02 — End: 2023-07-09
  Administered 2023-07-02 – 2023-07-08 (×4): 6 mg via ORAL
  Filled 2023-07-02 (×4): qty 2

## 2023-07-02 NOTE — Consults (Signed)
 [facility] Infectious Disease   Consult Note      Patient Name: Ana Kennedy Number: [id number]  Date of Service: 07/02/2023  Date of Birth: [birth year]    Hospital Day:  LOS: 0 days     Reason for Consultation:  Knee infection  Requesting Service: [facility] HOSPITALIST 3  Requesting Physician: Dorine Fredrickson, MD    Information Source: Patient and electronic medical record    History of Present Illness:  Ana Kennedy is a 88 y.o., White female who presents with the complaint of right knee infection, and right knee redness, with mid area dehisced.  Denies fall or trauma.  Patient has complex past medical history, and was managed by infectious disease service in [place]  in the past, and recommended to maintain chronic suppressive cephalexin  or rifampin , due to history of recurrent staphylococcal infection, with history of knee replacement and pacemaker.  Her suppressive medications were continued, upon patient coming to [place] .  However now it seems that she has a breakthrough infection, and came to the emergency room for further evaluation.  Her labs are notable for white count of 4 hemoglobin 10 hematocrit 34 platelets 197.  Sedimentation rate 61 BUN 12 creatinine 1, CRP 1,6. Blood cultures are pending.  X-ray shows soft tissue swelling and joint effusion.    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[place]     Cardiomyopathy, dilated (CMS HCC)     CHF (congestive heart failure)     Colon polyps     Esophageal reflux     H/O sepsis     Hypothyroidism     LBBB (left bundle branch block)     Mild cognitive disorder     Mild congestive heart failure (CMS HCC)     MSSA bacteremia     Narcolepsy     Psoriatic arthritis (CMS HCC)     Sepsis     Sjogren's syndrome          Past Surgical History:  Past Surgical History:   Procedure Laterality Date    COLONOSCOPY      ESOPHAGOGASTRODUODENOSCOPY      HIP ARTHROPLASTY  Bilateral     HX APPENDECTOMY      HX CATARACT REMOVAL      HX CHOLECYSTECTOMY      HX HERNIA REPAIR      umbilical    HX HYSTERECTOMY      HX KNEE SURGERY Bilateral     HX LUMBAR FUSION      HX PACEMAKER INSERTION      HX TONSILLECTOMY           Family History:  Family Medical History:       Problem Relation (Age of Onset)    Arthritis-osteo Father    Breast Cancer Sister, Maternal Aunt    Colon Cancer Father    Diabetes Father    Heart Disease Mother, Father    Osteoporosis Other            Allergies:  Allergies[1]    Medications:  Medications Prior to Admission       Prescriptions    cephalexin  (KEFLEX ) 500 mg Oral Capsule    Take 1 Capsule (500 mg total) by mouth Twice daily Indications: 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    cyclobenzaprine  (FLEXERIL ) 5 mg Oral Tablet    Take 1  Tablet (5 mg total) by mouth Three times a day as needed for Muscle spasms    ENTRESTO  24-26 mg Oral Tablet    Take 1 Tablet by mouth Twice daily    levothyroxine  (SYNTHROID ) 137 mcg Oral Tablet    Take 1 Tablet (137 mcg total) by mouth Every morning for 180 days    metoprolol  succinate (TOPROL -XL) 25 mg Oral Tablet Sustained Release 24 hr    Take 0.5 Tablets (12.5 mg total) by mouth Daily    rifAMPin  (RIFADIN ) 300 mg Oral Capsule    Take 1 Capsule (300 mg total) by mouth Every 12 hours Indications: suppression MSSA          Inpatient Medications:  No current facility-administered medications for this encounter.    Current Antimicrobials:  Antibiotics (From admission, onward)      None             Social History:    Review of Systems:  Review of Systems   All other systems reviewed and are negative.      Objective     Vital Signs:  Vitals:    07/02/23 1158 07/02/23 1230 07/02/23 1300 07/02/23 1330   BP:    (!) 136/110   Pulse: 61 62 60 79   Resp: 14 18 15 18    Temp:       SpO2:    (!) 84%   Weight:       Height:       BMI:                Physical Exam:  Current Vitals:   BP (!) 136/110   Pulse 79    Temp 37.2 C (99 F)   Resp 18   Ht 1.549 m (5' 1)   Wt 63 kg (139 lb)   SpO2 (!) 84%   BMI 26.26 kg/m        Vitals in last 24 hours: Temp  Avg: 37.2 C (99 F)  Min: 37.2 C (99 F)  Max: 37.2 C (99 F)  MAP (Non-Invasive)  Avg: 91.8 mmHG  Min: 79 mmHG  Max: 119 mmHG  Pulse  Avg: 64.6  Min: 58  Max: 79  Resp  Avg: 17.7  Min: 14  Max: 23  SpO2  Avg: 89.5 %  Min: 84 %  Max: 95 %    Physical Exam  Constitutional:       Appearance: Normal appearance.   HENT:      Head: Normocephalic and atraumatic.      Nose: Nose normal.   Eyes:      Conjunctiva/sclera: Conjunctivae normal.   Cardiovascular:      Rate and Rhythm: Normal rate.   Pulmonary:      Effort: Pulmonary effort is normal.   Abdominal:      Palpations: Abdomen is soft.   Musculoskeletal:         General: No tenderness.      Cervical back: Neck supple.   Skin:     General: Skin is warm.   Neurological:      Mental Status: She is alert and oriented to person, place, and time. Mental status is at baseline.   Psychiatric:         Behavior: Behavior normal.         Thought Content: Thought content normal.         Lines:  Patient Lines/Drains/Airways Status       Active Line / Dialysis Catheter /  Dialysis Graft / Drain / Airway / Wound       None                     Laboratory Studies:  CBC Differential   Recent Labs     07/02/23  1021   WBC 4.7   HGB 10.7*   HCT 34.5*   PLTCNT 197    Recent Labs     07/02/23  1021   PMNS 73.1   MONOCYTES 7.1   BASOPHILS 0.9  <0.10   PMNABS 3.40   LYMPHSABS 0.74*   MONOSABS 0.33   EOSABS 0.12      BMP LFTs   Recent Labs     07/02/23  1021   SODIUM 141   POTASSIUM 3.9   CHLORIDE 104   CO2 25   BUN 12   CREATININE 1.04*   GLUCOSENF 104   ANIONGAP 12   GFR 53*   CALCIUM 9.2   ALBUMIN 4.0    Recent Labs     07/02/23  1021   TOTALPROTEIN 7.6   ALBUMIN 4.0   TOTBILIRUBIN 0.4   AST 25   ALT 8   ALKPHOS 65      CoAgs Blood Gas:   No results found for this encounter No results found for this encounter    Cardiac Markers Lipid Panel    No results for input(s): TROPONINI, CKMB, MBINDEX, BNP in the last 72 hours. No results found for this encounter   Urine Analysis Other Labs   No results found for this encounter Recent Labs     07/02/23  1021   AESR 61*        Microbiology:  No results found for any visits on 07/02/23 (from the past 24 hours).  No results found for: ABC2    Imaging Studies:  Results for orders placed or performed during the hospital encounter of 07/02/23 (from the past 24 hours)   XR KNEE RIGHT 3 VIEW     Status: None    Narrative    Ana Kennedy    XR KNEE RIGHT 3 VIEW performed on 07/02/2023 12:20 PM.    INDICATION:  swelling   Additional History:  septic joint, wound/weeping mid anterior right knee, hx of multiple right knee surgeries, sepsis and psoriatic arthritis    TECHNIQUE:  3 views/3 images of the right knee submitted for interpretation.    COMPARISON:  None available.  ___________________________________  FINDINGS:    Right knee replacement noted in anatomic alignment. Hardware intact. No lucency around the hardware. No acute fracture or dislocation. Small joint effusion. Generalized soft tissue swelling present.      Impression    Soft tissue swelling, and joint effusion.    No fracture.        Radiologist location ID: [id number]       Results for orders placed or performed during the hospital encounter of 07/02/23 (from the past 2160 hours)   XR KNEE RIGHT 3 VIEW     Status: None    Narrative    Ana Kennedy    XR KNEE RIGHT 3 VIEW performed on 07/02/2023 12:20 PM.    INDICATION:  swelling   Additional History:  septic joint, wound/weeping mid anterior right knee, hx of multiple right knee surgeries, sepsis and psoriatic arthritis    TECHNIQUE:  3 views/3 images of the right knee submitted for interpretation.    COMPARISON:  None available.  ___________________________________  FINDINGS:  Right knee replacement noted in anatomic alignment. Hardware intact. No lucency around the hardware. No acute  fracture or dislocation. Small joint effusion. Generalized soft tissue swelling present.      Impression    Soft tissue swelling, and joint effusion.    No fracture.        Radiologist location ID: [id number]         Assessment/Plan:  Problem List[2]  Right knee infection  Status post right knee arthroplasty  Recommendations:  88 years old female seen today in the emergency room with family at bedside  Patient established with Infectious Disease Service here after she moved from [place] , where she was under infectious disease provider and suppressive therapy with Keflex  and rifampin , for history of recurrent staphylococcal infection, history of knee replacement and pacemaker.  She was admitted to the hospital with right knee swelling, and mid area dehiscence, without any reported trauma.  Obtain blood cultures, given her past medical history.  Orthopedic evaluation and consultation due to findings on exam and x-ray findings.  Patient and family would like to see Dr. Lenette.  Sedimentation rate was elevated.  Baseline CRP.  Further recommendations or adjustments of antibiotics, pending orthopedic evaluation.  Thank you very much for this consult.    On the day of the encounter, a total of   minutes was spent on this patient encounter including review of historical information, examination, documentation and post-visit activities. The time documented excludes procedural time.  Labs,cultures,imaging,and notes reviewed.     Discussed with patient and family    MDM Level  Problems addressed: MODERATE (>or=1 chronic illness w exacerbation, progression, or treatment side-effects; >or= 2 stable chronic illnesses; 1 undiagnosed new problem w uncertain prognosis; 1 acute illness w systemic symptoms; 1 acute complicated injury):    Data reviewed & analyzed: MODERATE: 1 of 3 following: review of external notes, review of tests results, ordering of tests, assessment requiring an independent historian; independent  interpretation of a test performed by another provider not separately reported; discussion of management or test interpretation w external provider:    Risk of morbidity: MODERATE (ex: prescription drug management, decision regarding minor surgery or elective major surgery, diagnosis or treatment limited by social determinants of health):      Thank you for this consultation.  We will continue to follow.  Please call with any questions regarding this patient.  Berna Bacon, MD         [1] No Known Allergies  [2]   Patient Active Problem List  Diagnosis    NICM (nonischemic cardiomyopathy) (CMS HCC)    Chronic combined systolic and diastolic CHF, NYHA class 2 (CMS HCC)    DOE (dyspnea on exertion)    Automatic implantable cardiac defibrillator in situ    Ventricular tachycardia (paroxysmal)    PAF (paroxysmal atrial fibrillation) (CMS HCC)    Essential hypertension    Hypothyroidism    Mild cognitive disorder    Lumbago    Sjogren syndrome    Septic joint

## 2023-07-02 NOTE — Assessment & Plan Note (Signed)
 Resume home dose levothyroxine.

## 2023-07-02 NOTE — Assessment & Plan Note (Signed)
 Resume metoprolol  and Entresto 

## 2023-07-02 NOTE — H&P (Signed)
 [facility]   Admission H&P    Ana Kennedy, Ana Kennedy, 88 y.o. female  Date of Admission:  07/02/2023  Date of Birth:  1935-09-13    Information Obtained from: patient  Chief Complaint:  88 year old female presents with right knee pain    History of Present Illness:  Ana Kennedy is a 88 y.o. female with known past medical history of hypertension, paroxysmal atrial fibrillation, systolic CHF, gastroesophageal reflux disease, hypothyroidism presents to the hospital with increased pain, swelling redness and discharge from right knee.  Patient underwent right knee replacement followed by episodes of staph infection on suppressive treatment with Keflex  or rifampin .  Noticed increased discharge from the surgical incision site presents to the emergency room.  Denies having any fever/chills, chest pain or trouble breathing.  No other associated symptoms.    ER course-on assessment patient's vitals stable.  Lab work done shows CBC within range anemic with hemoglobin 10.7 BMP appropriate renal function consistent with chronic kidney disease.  Inflammatory markers obtained infectious disease consulted admitted for further management.    Past Medical History:   Diagnosis Date    A-fib     Acrodermatitis continua of [place]     Cardiomyopathy, dilated (CMS HCC)     CHF (congestive heart failure)     Colon polyps     Esophageal reflux     H/O sepsis     Hypothyroidism     LBBB (left bundle branch block)     Mild cognitive disorder     Mild congestive heart failure (CMS HCC)     MSSA bacteremia     Narcolepsy     Psoriatic arthritis (CMS HCC)     Sepsis     Sjogren's syndrome          Past Surgical History:   Procedure Laterality Date    COLONOSCOPY      ESOPHAGOGASTRODUODENOSCOPY      HIP ARTHROPLASTY Bilateral     HX APPENDECTOMY      HX CATARACT REMOVAL      HX CHOLECYSTECTOMY      HX HERNIA REPAIR      umbilical    HX HYSTERECTOMY      HX KNEE SURGERY Bilateral     HX LUMBAR FUSION      HX PACEMAKER INSERTION       HX TONSILLECTOMY        Medications Prior to Admission       Prescriptions    cephalexin  (KEFLEX ) 500 mg Oral Capsule    Take 1 Capsule (500 mg total) by mouth Twice daily Indications: 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    cyclobenzaprine  (FLEXERIL ) 5 mg Oral Tablet    Take 1 Tablet (5 mg total) by mouth Three times a day as needed for Muscle spasms    ENTRESTO  24-26 mg Oral Tablet    Take 1 Tablet by mouth Twice daily    levothyroxine  (SYNTHROID ) 137 mcg Oral Tablet    Take 1 Tablet (137 mcg total) by mouth Every morning for 180 days    metoprolol  succinate (TOPROL -XL) 25 mg Oral Tablet Sustained Release 24 hr    Take 0.5 Tablets (12.5 mg total) by mouth Daily    rifAMPin  (RIFADIN ) 300 mg Oral Capsule    Take 1 Capsule (300 mg total) by mouth Every 12 hours Indications: suppression MSSA          Allergies[1]  Social History     Socioeconomic History  Marital status: Widowed   Tobacco Use    Smoking status: Never    Smokeless tobacco: Never   Vaping Use    Vaping status: Never Used   Substance and Sexual Activity    Alcohol use: Not Currently    Drug use: Never     Social Determinants of Health     Financial Resource Strain: Low Risk  (09/11/2022)    Financial Resource Strain     SDOH Financial: No   Transportation Needs: Low Risk  (09/11/2022)    Transportation Needs     SDOH Transportation: No   Social Connections: Low Risk  (09/11/2022)    Social Connections     SDOH Social Isolation: 5 or more times a week   Intimate Partner Violence: High Risk (09/11/2022)    Intimate Partner Violence     SDOH Domestic Violence: No   Housing Stability: Low Risk  (09/11/2022)    Housing Stability     SDOH Housing Situation: I have housing.     SDOH Housing Worry: No     Family Medical History:       Problem Relation (Age of Onset)    Arthritis-osteo Father    Breast Cancer Sister, Maternal Aunt    Colon Cancer Father    Diabetes Father    Heart Disease Mother, Father    Osteoporosis Other              Review of Systems:  All pertinent review of systems as address and detailed in HPI    Vital Signs:  Temperature: 37.2 C (99 F)  Heart Rate: 79  BP (Non-Invasive): (!) 136/110  Respiratory Rate: 18  SpO2: (!) 84 %      Physical Exam:    GENERAL:  Chronically ill-appearing elderly female  HEENT:  Normocephalic atraumatic, no lymphadenopathy in the neck.  CARDIAC: S1 and S2, no murmurs or gallops. No S3.  LUNGS: Clear to auscultation bilaterally. No wheezes or rhonchi.   GI: Abdomen soft, nontender. Bowel sounds +. No organomegaly appreciated.  GU: No suprapubic tenderness.  MUSCULOSKELETAL: No joint swelling  EXTREMITIES:  Serous discharge from the right knee surgical incision   NEUROLOGICAL: A&O X3, Cranial nerves grossly intact. No gross focal deficit.  PSYCHIATRIC: Appropriate mood and affect.   SKIN: Warm and dry.          LABS:  I have reviewed all lab results.  Lab Results Today:    Results for orders placed or performed during the hospital encounter of 07/02/23 (from the past 24 hours)   COMPREHENSIVE METABOLIC PANEL, NON-FASTING   Result Value Ref Range    SODIUM 141 133 - 144 mmol/L    POTASSIUM 3.9 3.2 - 5.0 mmol/L    CHLORIDE 104 96 - 106 mmol/L    CO2 TOTAL 25 22 - 30 mmol/L    ANION GAP 12 7 - 18 mmol/L    BUN 12 8 - 23 mg/dL    CREATININE 8.95 (H) 0.50 - 0.90 mg/dL    ESTIMATED GFR 53 (L) >90 mL/min/1.72m^2    ALBUMIN 4.0 3.5 - 5.2 g/dL    CALCIUM 9.2 8.3 - 89.2 mg/dL    GLUCOSE 895 74 - 890 mg/dL    ALKALINE PHOSPHATASE 65 35 - 129 U/L    ALT (SGPT) 8 0 - 33 U/L    AST (SGOT) 25 0 - 32 U/L    BILIRUBIN TOTAL 0.4 0.2 - 1.2 mg/dL    PROTEIN TOTAL 7.6 6.4 - 8.3  g/dL   C-REACTIVE PROTEIN (CRP)   Result Value Ref Range    C-REACTIVE PROTEIN (CRP) 1.6 (H) 0.0 - 0.9 mg/dL   SEDIMENTATION RATE   Result Value Ref Range    ERYTHROCYTE SEDIMENTATION RATE (ESR) 61 (H) 0 - 20 mm/hr   NT-PROBNP   Result Value Ref Range    NT-PROBNP 555 (H) 0 - 450 pg/mL   LACTIC ACID LEVEL W/ REFLEX FOR LEVEL >2.0   Result  Value Ref Range    LACTIC ACID 1.0 0.5 - 2.0 mmol/L   CBC WITH DIFF   Result Value Ref Range    WBC 4.7 3.7 - 11.0 x10^3/uL    RBC 3.47 (L) 3.85 - 5.22 x10^6/uL    HGB 10.7 (L) 11.5 - 16.0 g/dL    HCT 65.4 (L) 65.1 - 46.0 %    MCV 99.4 78.0 - 100.0 fL    MCH 30.8 26.0 - 32.0 pg    MCHC 31.0 31.0 - 35.5 g/dL    RDW-CV 84.5 88.4 - 84.4 %    PLATELETS 197 150 - 400 x10^3/uL    MPV 9.6 8.7 - 12.5 fL    NEUTROPHIL % 73.1 %    LYMPHOCYTE % 15.9 %    MONOCYTE % 7.1 %    EOSINOPHIL % 2.6 %    BASOPHIL % 0.9 %    NEUTROPHIL # 3.40 1.50 - 7.70 x10^3/uL    LYMPHOCYTE # 0.74 (L) 1.00 - 4.80 x10^3/uL    MONOCYTE # 0.33 0.20 - 1.10 x10^3/uL    EOSINOPHIL # 0.12 <=0.50 x10^3/uL    BASOPHIL # <0.10 <=0.20 x10^3/uL    IMMATURE GRANULOCYTE % 0.4 0.0 - 1.0 %    IMMATURE GRANULOCYTE # <0.10 <0.10 x10^3/uL   C-REACTIVE PROTEIN (CRP)   Result Value Ref Range    C-REACTIVE PROTEIN (CRP) 1.6 (H) 0.0 - 0.9 mg/dL   YHJ8R (HEMOGLOBIN J8R WITH EST AVG GLUCOSE)   Result Value Ref Range    HEMOGLOBIN A1C 5.4 4.0 - 6.0 %    ESTIMATED AVERAGE GLUCOSE 108 mg/dL   PROCALCITONIN   Result Value Ref Range    PROCALCITONIN  0.04 0.00 - 0.50 ng/mL       Radiology Results: No results found.       There is no immunization history on file for this patient.    DNR Status:  FULL CODE: ATTEMPT RESUSCITATION/CPR    ASSESSMENT/PLAN:    Assessment & Plan  Septic joint  Presents with increased swelling, erythematous skin changes and discharge from the right knee  -elevated CRP  Blood cultures obtained  ID consulted, appreciate recommendations  Will start the patient on broad-spectrum IV antibiotics cefepime  and Zyvox   Effusion of right knee joint  Knee swelling  X-ray of the right knee shows joint effusion  Orthopedics consulted, appreciate recommendations  PAF (paroxysmal atrial fibrillation) (CMS HCC)  Currently rate controlled.    Resume metoprolol  25 mg daily  Appears not on anticoagulation  Chronic combined systolic and diastolic CHF, NYHA class 2 (CMS  HCC)  Hemodynamically stable, O2 sats stable on room air.    No signs of fluid overload, BNP 555  Resume home dose Entresto  b.i.d.  NICM (nonischemic cardiomyopathy) (CMS HCC)  Automatic implantable cardiac defibrillator in situ    Essential hypertension  Resume metoprolol  and Entresto   Hypothyroidism  Resume home dose levothyroxine          DVT/PE Prophylaxis: Heparin     Marthe Frohlich, MD,         [  1] No Known Allergies

## 2023-07-02 NOTE — ED Nurses Note (Signed)
 Pt to xray at this time.

## 2023-07-02 NOTE — Care Management Notes (Signed)
 07/02/23 1210   Employment/Financial        Name of Insurance Coverage for Medications medicare/blue cross   Financial/Environmental Concerns none   Living Environment   Select an age group to open lives with row.    (alone)   Living Arrangements apartment   Able to Return to Prior Arrangements yes   Living Arrangement Comments lives in seniors apartment on same street as daughter   Home Safety   Home Assessment: No Problems Identified   Home Accessibility no concerns   Custody and Legal Status   Do you have a court appointed guardian/conservator? No   Legal Comment wants to redo MPOA She and daughter are completing and I will come back and answer any questions and have it notorized. Her current one had deceased husband on it.   Care Management Plan   Discharge Planning Status initial meeting   Discharge plan discussed with: Patient;MPOA   CM will evaluate for rehabilitation potential no   Patient choice offered to patient/family Yes   Form for patient choice reviewed/signed and on chart yes   Facility or Agency Preferences Used [facility] in [place]  will use here for wound care   Patient aware of possible cost for ambulance transport?    (NA)   Discharge Needs Assessment   Outpatient/Agency/Support Group Needs homecare agency   Equipment Currently Used at Home walker, rolling   Discharge Facility/Level of Care Needs Undetermined at this time   Transportation Available family or friend will provide   Referral Information   Admission Type inpatient   Address Verified verified-no changes   Arrived From home or self-care   ADVANCE DIRECTIVES   Does the Patient have an Advance Directive?   (redone)   Type of Advance Directive Completed Medical Power of Attorney;Medical Living Will  (redone)   Patient Hand-Off   Clinical/Discharge Plan of Care Information Communicated to:  Medical Social Worker;Clinical Care Coordinator   Comments New MPOA completed today. Dr Altamease texted daughter. Told her no surgery, local  interventions only   Mutuality/Individual Preferences    Patient-Specific Goals (Include Timeframe) Going home   Patient-Specific Preferences none voiced   Anxieties, Fears or Concerns none voiced   Individualized Care Needs HOH speak up when talking to her     Met with patient and daughter. Wanting to re-do her MPOA/LW. Her deceased husband was on document. Daughter present will assist with completing and I will return to answer any questions and have it notarized.  Patient a/o a little HOH. Lives alone in an apartment. Ambulates with a rollator. She sees Dr Birdena regarding these sores. Daughter said she has Sjograns Syndrome. She has had before before and it caused septicemia, removal of her pacemaker which when replaced was told that it couldn't be done again. This sore is right below her knee incision. Her right knee is swollen. She had to have knee surgery in [place] . She has had Spinal surgery.   She has had [facility] for wound care while in [place]. If needs would like again.

## 2023-07-02 NOTE — Assessment & Plan Note (Signed)
 X-ray of the right knee shows joint effusion  Orthopedics consulted, appreciate recommendations

## 2023-07-02 NOTE — Assessment & Plan Note (Signed)
 Currently rate controlled.    Resume metoprolol  25 mg daily  Appears not on anticoagulation

## 2023-07-02 NOTE — Assessment & Plan Note (Signed)
 Hemodynamically stable, O2 sats stable on room air.    No signs of fluid overload, BNP 555  Resume home dose Entresto  b.i.d.

## 2023-07-02 NOTE — Assessment & Plan Note (Signed)
 Presents with increased swelling, erythematous skin changes and discharge from the right knee  -elevated CRP  Blood cultures obtained  ID consulted, appreciate recommendations  Will start the patient on broad-spectrum IV antibiotics cefepime  and Zyvox 

## 2023-07-02 NOTE — Care Management Notes (Signed)
 07/02/23 1210   Employment/Financial        Name of Insurance Coverage for Medications medicare/blue cross   Financial/Environmental Concerns none   Living Environment   Select an age group to open lives with row.    (alone)   Living Arrangements apartment   Able to Return to Prior Arrangements yes   Living Arrangement Comments lives in seniors apartment on same street as daughter   Home Safety   Home Assessment: No Problems Identified   Home Accessibility no concerns   Custody and Legal Status   Do you have a court appointed guardian/conservator? No   Legal Comment wants to redo MPOA She and daughter are completing and I will come back and answer any questions and have it notorized. Her current one had deceased husband on it.   Care Management Plan   Discharge Planning Status initial meeting   Discharge plan discussed with: Patient;MPOA   CM will evaluate for rehabilitation potential no   Patient choice offered to patient/family Yes   Form for patient choice reviewed/signed and on chart yes   Facility or Agency Preferences Used [facility] in [place]  will use here for wound care   Patient aware of possible cost for ambulance transport?    (NA)   Discharge Needs Assessment   Outpatient/Agency/Support Group Needs homecare agency   Equipment Currently Used at Home walker, rolling   Discharge Facility/Level of Care Needs Undetermined at this time   Transportation Available family or friend will provide   Referral Information   Admission Type inpatient   Address Verified verified-no changes   Arrived From home or self-care   ADVANCE DIRECTIVES   Does the Patient have an Advance Directive?   (redone)   Type of Advance Directive Completed Medical Power of Attorney;Medical Living Will  (redone)   Patient Hand-Off   Clinical/Discharge Plan of Care Information Communicated to:  Medical Social Worker;Clinical Care Coordinator   Comments New MPOA completed today. Dr Altamease texted daughter. Told her no surgery, local  interventions only   Mutuality/Individual Preferences    Patient-Specific Goals (Include Timeframe) Going home   Patient-Specific Preferences none voiced   Anxieties, Fears or Concerns none voiced   Individualized Care Needs HOH speak up when talking to her     Met with patient and her daughter.

## 2023-07-03 ENCOUNTER — Encounter (INDEPENDENT_AMBULATORY_CARE_PROVIDER_SITE_OTHER): Payer: Self-pay | Admitting: INFECTIOUS DISEASE

## 2023-07-03 DIAGNOSIS — M009 Pyogenic arthritis, unspecified: Secondary | ICD-10-CM

## 2023-07-03 DIAGNOSIS — Z79899 Other long term (current) drug therapy: Secondary | ICD-10-CM

## 2023-07-03 LAB — CBC WITH DIFF
BASOPHIL #: <0.1 10*3/uL (ref <=0.20)
BASOPHIL %: 0.8 %
EOSINOPHIL #: 0.15 10*3/uL (ref <=0.50)
EOSINOPHIL %: 3.1 %
HCT: 33.8 % — ABNORMAL LOW (ref 34.8–46.0)
HGB: 10.8 g/dL — ABNORMAL LOW (ref 11.5–16.0)
IMMATURE GRANULOCYTE #: <0.1 10*3/uL (ref <0.10)
IMMATURE GRANULOCYTE %: 0.2 % (ref 0.0–1.0)
LYMPHOCYTE #: 0.67 10*3/uL — ABNORMAL LOW (ref 1.00–4.80)
LYMPHOCYTE %: 14 %
MCH: 30.9 pg (ref 26.0–32.0)
MCHC: 32 g/dL (ref 31.0–35.5)
MCV: 96.8 fL (ref 78.0–100.0)
MONOCYTE #: 0.39 10*3/uL (ref 0.20–1.10)
MONOCYTE %: 8.1 %
MPV: 10 fL (ref 8.7–12.5)
NEUTROPHIL #: 3.54 10*3/uL (ref 1.50–7.70)
NEUTROPHIL %: 73.8 %
PLATELETS: 187 10*3/uL (ref 150–400)
RBC: 3.49 10*6/uL — ABNORMAL LOW (ref 3.85–5.22)
RDW-CV: 15.1 % (ref 11.5–15.5)
WBC: 4.8 10*3/uL (ref 3.7–11.0)

## 2023-07-03 LAB — BASIC METABOLIC PANEL, FASTING
ANION GAP: 15 mmol/L (ref 7–18)
BUN/CREA RATIO: 13
BUN: 14 mg/dL (ref 8–23)
CALCIUM: 8.7 mg/dL (ref 8.3–10.7)
CHLORIDE: 102 mmol/L (ref 96–106)
CO2 TOTAL: 25 mmol/L (ref 22–30)
CREATININE: 1.09 mg/dL — ABNORMAL HIGH (ref 0.50–0.90)
ESTIMATED GFR: 50 mL/min/{1.73_m2} — ABNORMAL LOW (ref >90)
GLUCOSE: 107 mg/dL (ref 74–109)
POTASSIUM: 4.1 mmol/L (ref 3.2–5.0)
SODIUM: 142 mmol/L (ref 133–144)

## 2023-07-03 LAB — MAGNESIUM: MAGNESIUM: 2 mg/dL (ref 1.6–2.4)

## 2023-07-03 LAB — CREATINE KINASE (CK), TOTAL, SERUM OR PLASMA: CREATINE KINASE: 154 U/L (ref <=170)

## 2023-07-03 MED ORDER — SODIUM CHLORIDE 0.9 % INTRAVENOUS SOLUTION
1.0000 g | Freq: Two times a day (BID) | INTRAVENOUS | Status: DC
Start: 2023-07-03 — End: 2023-07-03

## 2023-07-03 MED ORDER — SODIUM CHLORIDE 0.9 % INTRAVENOUS SOLUTION
8.0000 mg/kg | INTRAVENOUS | Status: AC
Start: 2023-07-03 — End: 2023-07-03
  Administered 2023-07-03: 0 mg via INTRAVENOUS
  Administered 2023-07-03: 500 mg via INTRAVENOUS
  Filled 2023-07-03: qty 10

## 2023-07-03 MED ORDER — SODIUM CHLORIDE 0.9 % INTRAVENOUS SOLUTION
8.0000 mg/kg | Freq: Every evening | INTRAVENOUS | Status: DC
Start: 2023-07-04 — End: 2023-07-09
  Administered 2023-07-04: 500 mg via INTRAVENOUS
  Administered 2023-07-04: 0 mg via INTRAVENOUS
  Administered 2023-07-05: 500 mg via INTRAVENOUS
  Administered 2023-07-05 – 2023-07-06 (×2): 0 mg via INTRAVENOUS
  Administered 2023-07-06 – 2023-07-07 (×2): 500 mg via INTRAVENOUS
  Administered 2023-07-07 – 2023-07-08 (×2): 0 mg via INTRAVENOUS
  Administered 2023-07-08 – 2023-07-09 (×2): 500 mg via INTRAVENOUS
  Filled 2023-07-03 (×6): qty 10

## 2023-07-03 NOTE — Assessment & Plan Note (Signed)
 X-ray of the right knee shows joint effusion  Orthopedics consulted, discussed with patient's on operative versus non operative treatment recommended to continue antibiotics and will proceed with non operative treatment at this point.

## 2023-07-03 NOTE — Care Plan (Signed)
 Problem: Adult Inpatient Plan of Care  Goal: Plan of Care Review  Outcome: Ongoing (see interventions/notes)  Goal: Patient-Specific Goal (Individualized)  Outcome: Ongoing (see interventions/notes)  Goal: Absence of Hospital-Acquired Illness or Injury  Outcome: Ongoing (see interventions/notes)  Goal: Optimal Comfort and Wellbeing  Outcome: Ongoing (see interventions/notes)  Goal: Rounds/Family Conference  Outcome: Ongoing (see interventions/notes)     Problem: Health Knowledge, Opportunity to Enhance (Adult,Obstetrics,Pediatric)  Goal: Knowledgeable about Health Subject/Topic  Description: Patient will demonstrate the desired outcomes by discharge/transition of care.  Outcome: Ongoing (see interventions/notes)     Problem: Fall Injury Risk  Goal: Absence of Fall and Fall-Related Injury  Outcome: Ongoing (see interventions/notes)

## 2023-07-03 NOTE — Progress Notes (Signed)
 [facility]  Progress Note    Ana Kennedy  Date of service: 07/03/2023   Date of Admission:  07/02/2023    Hospital Day:  LOS: 1 day   Subjective:  Patient seen and examined at bedside this morning.  Resting in bed without any distress.  Denies any worsening pain or swelling.  She is alert oriented x3 but hard of hearing    Vital Signs:  Temp  Avg: 36.3 C (97.3 F)  Min: 36.3 C (97.3 F)  Max: 36.3 C (97.4 F)    Pulse  Avg: 68.3  Min: 60  Max: 79 BP  Min: 118/68  Max: 148/82   Resp  Avg: 17.9  Min: 15  Max: 20 SpO2  Avg: 95.6 %  Min: 84 %  Max: 99 %          Input/Output    Intake/Output Summary (Last 24 hours) at 07/03/2023 1230  Last data filed at 07/03/2023 0800  Gross per 24 hour   Intake 830.13 ml   Output --   Net 830.13 ml    I/O last shift:  06/26 0700 - 06/26 1859  In: 120 [P.O.:120]  Out: -    cefepime (MAXIPIME) 1 g in NS 50 mL IVPB with adaptor, 1 g, Intravenous, Q12H  heparin 5,000 unit/mL injection, 5,000 Units, Subcutaneous, Q8HRS  levothyroxine  (SYNTHROID) tablet 137 mcg, 137 mcg, Oral, QAM  linezolid (ZYVOX) 600 mg in iso-osmotic 300 mL premix IVPB, 600 mg, Intravenous, Q12H  melatonin tablet, 6 mg, Oral, HS PRN  metoprolol  succinate (TOPROL -XL) 24 hr extended release tablet, 12.5 mg, Oral, Daily  sacubitril-valsartan (ENTRESTO ) 24-26 mg per tablet, 1 Tablet, Oral, 2x/day        Physical Exam:    Age-appropriate looking elderly female , Not in acute distress   Cardiovascular: RRR, no murmurs  Respiratory: Clear to auscultation.  No wheezing  Abdomen: Soft, nontender, nondistended.  No  organomegaly  Neurologic: Alert and oriented x 3, moving all extremities.  Extremities-serous discharge from right knee wound    Labs:     Results for orders placed or performed during the hospital encounter of 07/02/23 (from the past 24 hours)   PROCALCITONIN   Result Value Ref Range    PROCALCITONIN  0.04 0.00 - 0.50 ng/mL   BASIC METABOLIC PANEL, FASTING   Result Value Ref Range    SODIUM 142  133 - 144 mmol/L    POTASSIUM 4.1 3.2 - 5.0 mmol/L    CHLORIDE 102 96 - 106 mmol/L    CO2 TOTAL 25 22 - 30 mmol/L    ANION GAP 15 7 - 18 mmol/L    CALCIUM 8.7 8.3 - 10.7 mg/dL    GLUCOSE 892 74 - 890 mg/dL    BUN 14 8 - 23 mg/dL    CREATININE 8.90 (H) 0.50 - 0.90 mg/dL    BUN/CREA RATIO 13     ESTIMATED GFR 50 (L) >90 mL/min/1.83m^2   MAGNESIUM   Result Value Ref Range    MAGNESIUM 2.0 1.6 - 2.4 mg/dL   CBC WITH DIFF   Result Value Ref Range    WBC 4.8 3.7 - 11.0 x10^3/uL    RBC 3.49 (L) 3.85 - 5.22 x10^6/uL    HGB 10.8 (L) 11.5 - 16.0 g/dL    HCT 66.1 (L) 65.1 - 46.0 %    MCV 96.8 78.0 - 100.0 fL    MCH 30.9 26.0 - 32.0 pg    MCHC 32.0 31.0 -  35.5 g/dL    RDW-CV 84.8 88.4 - 84.4 %    PLATELETS 187 150 - 400 x10^3/uL    MPV 10.0 8.7 - 12.5 fL    NEUTROPHIL % 73.8 %    LYMPHOCYTE % 14.0 %    MONOCYTE % 8.1 %    EOSINOPHIL % 3.1 %    BASOPHIL % 0.8 %    NEUTROPHIL # 3.54 1.50 - 7.70 x10^3/uL    LYMPHOCYTE # 0.67 (L) 1.00 - 4.80 x10^3/uL    MONOCYTE # 0.39 0.20 - 1.10 x10^3/uL    EOSINOPHIL # 0.15 <=0.50 x10^3/uL    BASOPHIL # <0.10 <=0.20 x10^3/uL    IMMATURE GRANULOCYTE % 0.2 0.0 - 1.0 %    IMMATURE GRANULOCYTE # <0.10 <0.10 x10^3/uL      Radiology:      Results for orders placed or performed during the hospital encounter of 07/02/23 (from the past 2160 hours)   XR KNEE RIGHT 3 VIEW     Status: None    Narrative    Roseland Shuffler    XR KNEE RIGHT 3 VIEW performed on 07/02/2023 12:20 PM.    INDICATION:  swelling   Additional History:  septic joint, wound/weeping mid anterior right knee, hx of multiple right knee surgeries, sepsis and psoriatic arthritis    TECHNIQUE:  3 views/3 images of the right knee submitted for interpretation.    COMPARISON:  None available.  ___________________________________  FINDINGS:    Right knee replacement noted in anatomic alignment. Hardware intact. No lucency around the hardware. No acute fracture or dislocation. Small joint effusion. Generalized soft tissue swelling present.       Impression    Soft tissue swelling, and joint effusion.    No fracture.        Radiologist location ID: [id number]        Consults:  Orthopedics, ID  Assessment/ Plan:   Assessment & Plan  Septic joint  Presents with increased swelling, erythematous skin changes and discharge from the right knee  -elevated CRP  Blood cultures obtained  ID consulted, appreciate recommendations  Continue IV antibiotics cefepime and Zyvox  Effusion of right knee joint  Knee swelling  X-ray of the right knee shows joint effusion  Orthopedics consulted, discussed with patient's on operative versus non operative treatment recommended to continue antibiotics and will proceed with non operative treatment at this point.  PAF (paroxysmal atrial fibrillation) (CMS HCC)  Currently rate controlled.    Resume metoprolol  25 mg daily  Appears not on anticoagulation  Chronic combined systolic and diastolic CHF, NYHA class 2 (CMS HCC)  Hemodynamically stable, O2 sats stable on room air.    No signs of fluid overload, BNP 555  Resume home dose Entresto  b.i.d.  NICM (nonischemic cardiomyopathy) (CMS HCC)  Automatic implantable cardiac defibrillator in situ    Essential hypertension  Resume metoprolol  and Entresto   Hypothyroidism  Resume home dose levothyroxine          DVT/PE Prophylaxis: Heparin    Marthe Frohlich, MD,

## 2023-07-03 NOTE — Consults (Signed)
 [facility] MEDICINE [facility]  [address]  [place] [address]       Name: Ana Kennedy MRN:  [id number]   Date of Birth: 1935/04/18 Age: 88 y.o.   Date: 07/02/2023       Attending: Dorine Fredrickson, MD [phone]*   PCP: [name], MD  Admit Date: 07/02/2023     Admit DX: Septic joint [M00.9]     Subjective:  Ana Kennedy is a 88 y.o. female  who has a history of chronic infection of the right knee.  She states that she has early Alzheimer's but she is a fairly good historian.  She states that she has been on suppressive antibiotics for 30 years.  She states that years ago she had drainage from the knee but has not had any drainage until the past few weeks.  She denies any injuries to the knee.  She is unsure of the exact time of her right knee replacement but it was not recent.  Previous x-rays were reviewed showing likely chronic infection of the right total knee arthroplasty.        Objective:  Results for orders placed or performed during the hospital encounter of 07/02/23 (from the past 24 hours)   PROCALCITONIN    Collection Time: 07/02/23 12:52 PM   Result Value Ref Range    PROCALCITONIN  0.04 0.00 - 0.50 ng/mL   CBC/DIFF    Collection Time: 07/03/23  4:26 AM    Narrative    The following orders were created for panel order CBC/DIFF.  Procedure                               Abnormality         Status                     ---------                               -----------         ------                     CBC WITH DIFF[[id number]]                Abnormal            Final result                 Please view results for these tests on the individual orders.   BASIC METABOLIC PANEL, FASTING    Collection Time: 07/03/23  4:26 AM   Result Value Ref Range    SODIUM 142 133 - 144 mmol/L    POTASSIUM 4.1 3.2 - 5.0 mmol/L    CHLORIDE 102 96 - 106 mmol/L    CO2 TOTAL 25 22 - 30 mmol/L    ANION GAP 15 7 - 18 mmol/L    CALCIUM 8.7 8.3 - 10.7 mg/dL    GLUCOSE 892 74 - 890 mg/dL     BUN 14 8 - 23 mg/dL    CREATININE 8.90 (H) 0.50 - 0.90 mg/dL    BUN/CREA RATIO 13     ESTIMATED GFR 50 (L) >90 mL/min/1.13m^2   MAGNESIUM    Collection Time: 07/03/23  4:26 AM   Result Value Ref Range    MAGNESIUM 2.0 1.6 - 2.4 mg/dL  CBC WITH DIFF    Collection Time: 07/03/23  4:26 AM   Result Value Ref Range    WBC 4.8 3.7 - 11.0 x10^3/uL    RBC 3.49 (L) 3.85 - 5.22 x10^6/uL    HGB 10.8 (L) 11.5 - 16.0 g/dL    HCT 66.1 (L) 65.1 - 46.0 %    MCV 96.8 78.0 - 100.0 fL    MCH 30.9 26.0 - 32.0 pg    MCHC 32.0 31.0 - 35.5 g/dL    RDW-CV 84.8 88.4 - 84.4 %    PLATELETS 187 150 - 400 x10^3/uL    MPV 10.0 8.7 - 12.5 fL    NEUTROPHIL % 73.8 %    LYMPHOCYTE % 14.0 %    MONOCYTE % 8.1 %    EOSINOPHIL % 3.1 %    BASOPHIL % 0.8 %    NEUTROPHIL # 3.54 1.50 - 7.70 x10^3/uL    LYMPHOCYTE # 0.67 (L) 1.00 - 4.80 x10^3/uL    MONOCYTE # 0.39 0.20 - 1.10 x10^3/uL    EOSINOPHIL # 0.15 <=0.50 x10^3/uL    BASOPHIL # <0.10 <=0.20 x10^3/uL    IMMATURE GRANULOCYTE % 0.2 0.0 - 1.0 %    IMMATURE GRANULOCYTE # <0.10 <0.10 x10^3/uL     Physical Exam:   Vital Signs:  Temperature: 36.3 C (97.3 F)  Heart Rate: 60  BP (Non-Invasive): 132/65  Respiratory Rate: 17  SpO2: 96 %         Physical Exam:  On examination she is lying in bed comfortably, alert and oriented.  There is small open area at the midportion of the midline incision of the right knee.  No surrounding erythema.  Mild swelling of the knee.  Neurovascular intact.      Assessment and Plan:  Septic joint     We discussed options of nonoperative versus operative treatment.  We discussed that surgery would involve explant of her total knee arthroplasty with implant of an antibiotic spacer.  She has been stable on suppressive antibiotics for many years and would like to avoid surgery if possible.  She would like to proceed with nonoperative treatment at this point.  Continue antibiotics per Infectious Disease and we will re-evaluate.        Oneil Soda, PA-C     I personally evaluated the  patient as part of a shared service with an APP.      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  [place].  Recent drainage from the knee  X-rays reviewed by me independently showing most likely loosening of the implant    MDM (complete) patient not interested in surgery at this time.  She would like to try to avoid this.  Recommend continue suppressive antibiotics.  If the patient becomes unstable or would like to proceed with surgery we can discuss this with the patient.  She most likely will need removal of the implant with placement of antibiotic spacer.    I performed greater than 50% of the total care and medical decision making process for this patient.    Donnice Broach D.O.    Portions of this note may be dictated using voice recognition software or a dictation service. Variances in spelling and vocabulary are possible and unintentional. Not all errors are caught/corrected. Please notify the dino if any discrepancies are noted or if the meaning  of any statement is not clear.

## 2023-07-03 NOTE — Assessment & Plan Note (Signed)
 Resume home dose levothyroxine.

## 2023-07-03 NOTE — Consults (Signed)
 Consult to wound care for right knee wound dehiscence.  Dr,. Lenette has been consulted.  Please see his orders for wound care.      For questions or concerns, call [phone]    Ana Kennedy, BSN, RN, [facility], ACM-RN

## 2023-07-03 NOTE — Consults (Signed)
 Ana Kennedy  Date of service: 07/03/2023  Date of Admission:  07/02/2023  Hospital Day:  LOS: 1 day     Subjective:  Seen at bedside comfortably resting, still with some drainage from the knee    Review of Systems:    Review of Systems   All other systems reviewed and are negative.      Objective:      Vital Signs:  Vitals:    07/02/23 2300 07/03/23 0228 07/03/23 0734 07/03/23 1114   BP: (!) 124/55 138/69 132/65 (!) 148/82   Pulse: 64 65 60 68   Resp: 18 20 17 17    Temp:  36.3 C (97.3 F) 36.3 C (97.3 F)    SpO2: 97% 98% 96% 97%   Weight:       Height:       BMI:                Medications:  Antibiotics (last 24 hours)       Date/Time Action Medication Dose Rate    07/03/23 0509 New Bag/New Syringe    linezolid (ZYVOX) 600 mg in iso-osmotic 300 mL premix IVPB 600 mg 300 mL/hr    07/03/23 0507 New Bag/New Syringe    cefepime (MAXIPIME) 2 g in NS 50 mL IVPB with adaptor 2 g 112 mL/hr    07/02/23 1825 New Bag/New Syringe    linezolid (ZYVOX) 600 mg in iso-osmotic 300 mL premix IVPB 600 mg 300 mL/hr    07/02/23 1824 New Bag/New Syringe    cefepime (MAXIPIME) 2 g in NS 50 mL IVPB with adaptor 2 g 112 mL/hr          cefepime (MAXIPIME) 1 g in NS 50 mL IVPB with adaptor, 1 g, Intravenous, Q12H  heparin 5,000 unit/mL injection, 5,000 Units, Subcutaneous, Q8HRS  levothyroxine  (SYNTHROID) tablet 137 mcg, 137 mcg, Oral, QAM  linezolid (ZYVOX) 600 mg in iso-osmotic 300 mL premix IVPB, 600 mg, Intravenous, Q12H  melatonin tablet, 6 mg, Oral, HS PRN  metoprolol  succinate (TOPROL -XL) 24 hr extended release tablet, 12.5 mg, Oral, Daily  sacubitril-valsartan (ENTRESTO ) 24-26 mg per tablet, 1 Tablet, Oral, 2x/day        Physical Exam:    Physical Exam  HENT:      Head: Normocephalic.      Nose: Nose normal.   Eyes:      Conjunctiva/sclera: Conjunctivae normal.   Cardiovascular:      Rate and Rhythm: Normal rate.   Pulmonary:      Effort: Pulmonary effort is normal.   Abdominal:      Palpations: Abdomen is soft.    Musculoskeletal:         General: No tenderness.      Cervical back: Neck supple.   Skin:     General: Skin is warm.   Neurological:      Mental Status: She is alert and oriented to person, place, and time. Mental status is at baseline.   Psychiatric:         Behavior: Behavior normal.         Thought Content: Thought content normal.          Imaging:         Labs:    Results for orders placed or performed during the hospital encounter of 07/02/23 (from the past 24 hours)   BASIC METABOLIC PANEL, FASTING   Result Value Ref Range    SODIUM 142 133 - 144 mmol/L    POTASSIUM  4.1 3.2 - 5.0 mmol/L    CHLORIDE 102 96 - 106 mmol/L    CO2 TOTAL 25 22 - 30 mmol/L    ANION GAP 15 7 - 18 mmol/L    CALCIUM 8.7 8.3 - 10.7 mg/dL    GLUCOSE 892 74 - 890 mg/dL    BUN 14 8 - 23 mg/dL    CREATININE 8.90 (H) 0.50 - 0.90 mg/dL    BUN/CREA RATIO 13     ESTIMATED GFR 50 (L) >90 mL/min/1.77m^2   MAGNESIUM   Result Value Ref Range    MAGNESIUM 2.0 1.6 - 2.4 mg/dL   CBC WITH DIFF   Result Value Ref Range    WBC 4.8 3.7 - 11.0 x10^3/uL    RBC 3.49 (L) 3.85 - 5.22 x10^6/uL    HGB 10.8 (L) 11.5 - 16.0 g/dL    HCT 66.1 (L) 65.1 - 46.0 %    MCV 96.8 78.0 - 100.0 fL    MCH 30.9 26.0 - 32.0 pg    MCHC 32.0 31.0 - 35.5 g/dL    RDW-CV 84.8 88.4 - 84.4 %    PLATELETS 187 150 - 400 x10^3/uL    MPV 10.0 8.7 - 12.5 fL    NEUTROPHIL % 73.8 %    LYMPHOCYTE % 14.0 %    MONOCYTE % 8.1 %    EOSINOPHIL % 3.1 %    BASOPHIL % 0.8 %    NEUTROPHIL # 3.54 1.50 - 7.70 x10^3/uL    LYMPHOCYTE # 0.67 (L) 1.00 - 4.80 x10^3/uL    MONOCYTE # 0.39 0.20 - 1.10 x10^3/uL    EOSINOPHIL # 0.15 <=0.50 x10^3/uL    BASOPHIL # <0.10 <=0.20 x10^3/uL    IMMATURE GRANULOCYTE % 0.2 0.0 - 1.0 %    IMMATURE GRANULOCYTE # <0.10 <0.10 x10^3/uL       Microbiology:  Hospital Encounter on 07/02/23 (from the past 96 hours)   ADULT ROUTINE BLOOD CULTURE, SET OF 2 ADULT BOTTLES (BACTERIA AND YEAST)    Collection Time: 07/02/23 12:52 PM    Specimen: Blood   Culture Result Status    BLOOD  CULTURE, ROUTINE No Growth 18-24 hrs. Preliminary   ADULT ROUTINE BLOOD CULTURE, SET OF 2 ADULT BOTTLES (BACTERIA AND YEAST)    Collection Time: 07/02/23 12:52 PM    Specimen: Blood   Culture Result Status    BLOOD CULTURE, ROUTINE No Growth 18-24 hrs. Preliminary       Assessment/ Plan:   Active Hospital Problems    Diagnosis    Primary Problem: Septic joint    Knee swelling    Effusion of right knee joint    Hypothyroidism    PAF (paroxysmal atrial fibrillation) (CMS HCC)    NICM (nonischemic cardiomyopathy) (CMS HCC)    Chronic combined systolic and diastolic CHF, NYHA class 2 (CMS HCC)    Automatic implantable cardiac defibrillator in situ    Essential hypertension      88 years old lady seen today and chart reviewed, discussed with patient at bedside, feeling and doing the same.  Patient has known history of chronic right knee infection, and was maintained on suppressive Keflex  and rifampin , and [place] of state by infectious disease service earlier.  Patient came to the hospital due to drainage from the knee, and x-ray that shows soft tissue swelling and joint effusion.  Her blood cultures are preliminary negative.  Orthopedic service has seen the patient and reviewed consultation.  Labs were reviewed white count 4.8 hemoglobin  10 platelets 180, sedimentation rate 61, BUN 14 creatinine 1, CRP 1,6. Acute marker not very elevated, and ESR possibly related to chronic infection in the knee, for which received suppressive treatment.  She is now on cefepime and Zyvox, to continue for now, reassess tomorrow again, regarding treatment plan or discharge plan, if patient is considering avoiding surgical intervention.  On the day of the encounter, a total of   minutes was spent on this patient encounter including review of historical information, examination, documentation and post-visit activities. The time documented excludes procedural time.  Labs,cultures,imaging,and notes reviewed.     Discussed with patient and  family    MDM Level  Problems addressed: MODERATE (>or=1 chronic illness w exacerbation, progression, or treatment side-effects; >or= 2 stable chronic illnesses; 1 undiagnosed new problem w uncertain prognosis; 1 acute illness w systemic symptoms; 1 acute complicated injury):    Data reviewed & analyzed: MODERATE: 1 of 3 following: review of external notes, review of tests results, ordering of tests, assessment requiring an independent historian; independent interpretation of a test performed by another provider not separately reported; discussion of management or test interpretation w external provider:    Risk of morbidity: MODERATE (ex: prescription drug management, decision regarding minor surgery or elective major surgery, diagnosis or treatment limited by social determinants of health):        Berna Bacon, MD

## 2023-07-03 NOTE — Assessment & Plan Note (Signed)
 Resume metoprolol  and Entresto 

## 2023-07-03 NOTE — Assessment & Plan Note (Signed)
 Currently rate controlled.    Resume metoprolol  25 mg daily  Appears not on anticoagulation

## 2023-07-03 NOTE — ED Provider Notes (Signed)
 [facility] - Emergency Department  ED Physician Note      Arrival: Car    Chief Complaint:    Chief Complaint   Patient presents with    Post-Op Problem    Wound Infection     Patient has a hx of right knee septicemia and has to have it opened up and drained periodically. Pt is from [place]  and just moved here to live with her daughter and isn't established with an orthopedist yet. Dr. Lenette couldn't see the patient until November and the daughter is concerned that it could be infected and that she needs to be seen sooner. Pt is established with Dr. Quay with ID. Pt's knee appears very swollen and pt states that it is painful and is leaking fluid.     Ana Kennedy is a 88 y.o. female who had concerns including Post-Op Problem and Wound Infection.    History of Present Illness:  This lady has history of septicemia secondary to recurrent right knee infection after replacement many years ago. Is on daily Keflex  and rimfampin I believe with Dr. Melrose. Comes in today with an open wound to anterior knee with swelling, drainage and warmth. Labs completed. Family requesting Dr. Lenette by name. 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[place]     Cardiomyopathy, dilated (CMS HCC)     CHF (congestive heart failure)     Colon polyps     Esophageal reflux     H/O sepsis     Hypothyroidism     LBBB (left bundle branch block)     Mild cognitive disorder     Mild congestive heart failure (CMS HCC)     MSSA bacteremia     Narcolepsy     Psoriatic arthritis (CMS HCC)     Sepsis     Sjogren's syndrome        Past Surgical History:   Procedure Laterality Date    Colonoscopy      Esophagogastroduodenoscopy      Hip arthroplasty Bilateral     Hx appendectomy      Hx cataract removal      Hx cholecystectomy      Hx hernia repair      Hx  hysterectomy      Hx knee surgery Bilateral     Hx lumbar fusion      Hx pacemaker insertion      Hx tonsillectomy         Family History   Problem Relation Age of Onset    Heart Disease Mother     Heart Disease Father     Diabetes Father     Arthritis-osteo Father     Colon Cancer Father     Breast Cancer Sister     Breast Cancer Maternal Aunt     Osteoporosis Other        Social History     Socioeconomic History    Marital status: Widowed     Spouse name: Not on file    Number of children: Not on file    Years of education: Not on file    Highest education level: Not on file   Occupational History    Not  on file   Tobacco Use    Smoking status: Never    Smokeless tobacco: Never   Vaping Use    Vaping status: Never Used   Substance and Sexual Activity    Alcohol use: Not Currently    Drug use: Never    Sexual activity: Not on file   Other Topics Concern    Not on file   Social History Narrative    Not on file     Social Determinants of Health     Financial Resource Strain: Low Risk  (09/11/2022)    Financial Resource Strain     SDOH Financial: No   Transportation Needs: Low Risk  (09/11/2022)    Transportation Needs     SDOH Transportation: No   Social Connections: Low Risk  (07/03/2023)    Social Connections     SDOH Social Isolation: 5 or more times a week   Intimate Partner Violence: High Risk (09/11/2022)    Intimate Partner Violence     SDOH Domestic Violence: No   Housing Stability: Low Risk  (09/11/2022)    Housing Stability     SDOH Housing Situation: I have housing.     SDOH Housing Worry: No       Meds/Allergies:    Current Outpatient Medications   Medication Instructions    cephalexin  (KEFLEX ) 500 mg, 2 TIMES DAILY    cyclobenzaprine  (FLEXERIL ) 5 mg, Oral, 3 TIMES DAILY PRN    ENTRESTO  24-26 mg Oral Tablet 1 Tablet, Oral, 2 TIMES DAILY    levothyroxine  (SYNTHROID) 137 mcg, Oral, EVERY MORNING    metoprolol  succinate (TOPROL -XL) 12.5 mg, Oral, Daily    rifAMPin  (RIFADIN ) 300 mg, 2 TIMES DAILY       Allergies[1]    Physical Exam:    ED Triage Vitals [07/02/23 1013]   BP (Non-Invasive) (!) 145/73   Heart Rate 69   Respiratory Rate 18   Temperature 37.2 C (99 F)   SpO2 95 %   Weight 63 kg (139 lb)   Height 1.549 m (5' 1)     Physical Exam   CONSTITUTIONAL: [Alert and oriented and responds appropriately to questions.]  HEAD: [Normocephalic; atraumatic]  EYES: PERRL; [Conjunctivae clear, sclerae non-icteric]  ENT: [normal nose; no rhinorrhea; moist mucous membranes; pharynx without lesions noted]  NECK: [Supple without meningismus; non-tender;]  EXT: [Appear atraumatic; non-tender to palpation; no cyanosis, no effusions, no edema]   SKIN: [No rashes, lesions, or pigment changes appreciated on gross exam. Capillary refill less than three seconds]  NEURO: [Moves all extremities equally; Sensory function intact]  PSYCH: [The patient's mood and manner are appropriate. Grooming and personal hygiene are appropriate.]     Results:    Labs Ordered/Reviewed   COMPREHENSIVE METABOLIC PANEL, NON-FASTING - Abnormal; Notable for the following components:       Result Value    CREATININE 1.04 (*)     ESTIMATED GFR 53 (*)     All other components within normal limits   C-REACTIVE PROTEIN (CRP) - Abnormal; Notable for the following components:    C-REACTIVE PROTEIN (CRP) 1.6 (*)     All other components within normal limits   SEDIMENTATION RATE - Abnormal; Notable for the following components:    ERYTHROCYTE SEDIMENTATION RATE (ESR) 61 (*)     All other components within normal limits   NT-PROBNP - Abnormal; Notable for the following components:    NT-PROBNP 555 (*)     All other components within normal limits  CBC WITH DIFF - Abnormal; Notable for the following components:    RBC 3.47 (*)     HGB 10.7 (*)     HCT 34.5 (*)     LYMPHOCYTE # 0.74 (*)     All other components within normal limits   C-REACTIVE PROTEIN (CRP) - Abnormal; Notable for the following components:    C-REACTIVE PROTEIN (CRP) 1.6 (*)     All other  components within normal limits   LACTIC ACID LEVEL W/ REFLEX FOR LEVEL >2.0 - Normal   PROCALCITONIN - Normal   ADULT ROUTINE BLOOD CULTURE, SET OF 2 BOTTLES (BACTERIA AND YEAST)   ADULT ROUTINE BLOOD CULTURE, SET OF 2 BOTTLES (BACTERIA AND YEAST)   CBC/DIFF    Narrative:     The following orders were created for panel order CBC/DIFF.  Procedure                               Abnormality         Status                     ---------                               -----------         ------                     CBC WITH DIFF[[id number]]                Abnormal            Final result                 Please view results for these tests on the individual orders.   HGA1C (HEMOGLOBIN A1C WITH EST AVG GLUCOSE)    Narrative:     TEST INFORMATION: ESTIMATED AVERAGE GLUCOSE RESULT  Estimated average glucose is a value calculated from percentage HbA1c but reported in the same units as glucose.  The American Diabetes Association and The American Association for Clinical Chemistry recommends use of eAG when reporting HgB A1C values.         XR KNEE RIGHT 3 VIEW   Final Result by Edi, Radresults In (06/25 1252)      Soft tissue swelling, and joint effusion.      No fracture.            Radiologist location ID: [id number]             ED Course:       Medications Ordered/Administered in the ED       MDM:       Medical Decision Making  Patient arrives with daughter who helps with history. Patient with remote history of knee replacement and unfortunately recurrent knee infections. Started looking red and concerning about two weeks ago. Already on life long abx with ID here. Labs show elevation of inflammatory markers. Deferred abx to ID. Consult made with hospitalist, ID and family's requested ortho Dr. Lenette. Patient and family aware and agreeable for admission    Amount and/or Complexity of Data Reviewed  Labs:  Decision-making details documented in ED Course.  Radiology: ordered. Decision-making details documented in ED  Course.    Risk  Decision regarding hospitalization.        Impression:    Clinical Impression  Knee swelling (Primary)         Disposition:   Admitted        Part of this note may have been generated using voice recognition software.  Be advised, it is possible that the generated note may be prone to syntax and other dictation software errors.         [1] No Known Allergies

## 2023-07-03 NOTE — Assessment & Plan Note (Signed)
 Hemodynamically stable, O2 sats stable on room air.    No signs of fluid overload, BNP 555  Resume home dose Entresto  b.i.d.

## 2023-07-03 NOTE — Assessment & Plan Note (Signed)
 Presents with increased swelling, erythematous skin changes and discharge from the right knee  -elevated CRP  Blood cultures obtained  ID consulted, appreciate recommendations  Continue IV antibiotics cefepime and Zyvox

## 2023-07-04 DIAGNOSIS — M00861 Arthritis due to other bacteria, right knee: Secondary | ICD-10-CM

## 2023-07-04 DIAGNOSIS — R7881 Bacteremia: Secondary | ICD-10-CM

## 2023-07-04 LAB — BASIC METABOLIC PANEL, FASTING
ANION GAP: 12 mmol/L (ref 7–18)
BUN/CREA RATIO: 12
BUN: 13 mg/dL (ref 8–23)
CALCIUM: 8.7 mg/dL (ref 8.3–10.7)
CHLORIDE: 103 mmol/L (ref 96–106)
CO2 TOTAL: 25 mmol/L (ref 22–30)
CREATININE: 1.12 mg/dL — ABNORMAL HIGH (ref 0.50–0.90)
ESTIMATED GFR: 48 mL/min/{1.73_m2} — ABNORMAL LOW (ref >90)
GLUCOSE: 103 mg/dL (ref 74–109)
POTASSIUM: 3.8 mmol/L (ref 3.2–5.0)
SODIUM: 140 mmol/L (ref 133–144)

## 2023-07-04 LAB — CBC WITH DIFF
BASOPHIL #: <0.1 10*3/uL (ref <=0.20)
BASOPHIL %: 0.8 %
EOSINOPHIL #: 0.17 10*3/uL (ref <=0.50)
EOSINOPHIL %: 4.3 %
HCT: 32.6 % — ABNORMAL LOW (ref 34.8–46.0)
HGB: 10.7 g/dL — ABNORMAL LOW (ref 11.5–16.0)
IMMATURE GRANULOCYTE #: <0.1 10*3/uL (ref <0.10)
IMMATURE GRANULOCYTE %: 0.5 % (ref 0.0–1.0)
LYMPHOCYTE #: 0.57 10*3/uL — ABNORMAL LOW (ref 1.00–4.80)
LYMPHOCYTE %: 14.3 %
MCH: 31.4 pg (ref 26.0–32.0)
MCHC: 32.8 g/dL (ref 31.0–35.5)
MCV: 95.6 fL (ref 78.0–100.0)
MONOCYTE #: 0.34 10*3/uL (ref 0.20–1.10)
MONOCYTE %: 8.5 %
MPV: 9.7 fL (ref 8.7–12.5)
NEUTROPHIL #: 2.85 10*3/uL (ref 1.50–7.70)
NEUTROPHIL %: 71.6 %
PLATELETS: 177 10*3/uL (ref 150–400)
RBC: 3.41 10*6/uL — ABNORMAL LOW (ref 3.85–5.22)
RDW-CV: 15.2 % (ref 11.5–15.5)
WBC: 4 10*3/uL (ref 3.7–11.0)

## 2023-07-04 LAB — MAGNESIUM: MAGNESIUM: 2 mg/dL (ref 1.6–2.4)

## 2023-07-04 NOTE — Consults (Signed)
 [facility] MEDICINE [facility]  [address]  [place] [address]       Name: Ana Kennedy MRN:  [id number]   Date of Birth: Jun 28, 1935 Age: 88 y.o.   Date: 07/02/2023       Provider: No name on file  PCP: [name], MD  Admit Date: 07/02/2023   Procedure date: No surgery found    Admit DX: Septic joint [M00.9]     Subjective:  Ana Kennedy is a 88 y.o. female currently resting in bed.  States her knee is feeling better..      Objective:  Results for orders placed or performed during the hospital encounter of 07/02/23 (from the past 24 hours)   CREATINE KINASE (CK), TOTAL, SERUM - TODAY    Collection Time: 07/03/23  7:26 PM   Result Value Ref Range    CREATINE KINASE 154 <=170 U/L   CBC/DIFF    Collection Time: 07/04/23  3:43 AM    Narrative    The following orders were created for panel order CBC/DIFF.  Procedure                               Abnormality         Status                     ---------                               -----------         ------                     CBC WITH DIFF[[id number]]                Abnormal            Final result                 Please view results for these tests on the individual orders.   BASIC METABOLIC PANEL, FASTING    Collection Time: 07/04/23  3:43 AM   Result Value Ref Range    SODIUM 140 133 - 144 mmol/L    POTASSIUM 3.8 3.2 - 5.0 mmol/L    CHLORIDE 103 96 - 106 mmol/L    CO2 TOTAL 25 22 - 30 mmol/L    ANION GAP 12 7 - 18 mmol/L    CALCIUM 8.7 8.3 - 10.7 mg/dL    GLUCOSE 896 74 - 890 mg/dL    BUN 13 8 - 23 mg/dL    CREATININE 8.87 (H) 0.50 - 0.90 mg/dL    BUN/CREA RATIO 12     ESTIMATED GFR 48 (L) >90 mL/min/1.79m^2   MAGNESIUM    Collection Time: 07/04/23  3:43 AM   Result Value Ref Range    MAGNESIUM 2.0 1.6 - 2.4 mg/dL   CBC WITH DIFF    Collection Time: 07/04/23  3:43 AM   Result Value Ref Range    WBC 4.0 3.7 - 11.0 x10^3/uL    RBC 3.41 (L) 3.85 - 5.22 x10^6/uL    HGB 10.7 (L) 11.5 - 16.0 g/dL    HCT 67.3 (L) 65.1 - 46.0 %    MCV  95.6 78.0 - 100.0 fL    MCH 31.4 26.0 - 32.0 pg    MCHC 32.8 31.0 - 35.5 g/dL  RDW-CV 15.2 11.5 - 15.5 %    PLATELETS 177 150 - 400 x10^3/uL    MPV 9.7 8.7 - 12.5 fL    NEUTROPHIL % 71.6 %    LYMPHOCYTE % 14.3 %    MONOCYTE % 8.5 %    EOSINOPHIL % 4.3 %    BASOPHIL % 0.8 %    NEUTROPHIL # 2.85 1.50 - 7.70 x10^3/uL    LYMPHOCYTE # 0.57 (L) 1.00 - 4.80 x10^3/uL    MONOCYTE # 0.34 0.20 - 1.10 x10^3/uL    EOSINOPHIL # 0.17 <=0.50 x10^3/uL    BASOPHIL # <0.10 <=0.20 x10^3/uL    IMMATURE GRANULOCYTE % 0.5 0.0 - 1.0 %    IMMATURE GRANULOCYTE # <0.10 <0.10 x10^3/uL          Physical Exam:   Vital Signs:  Filed Vitals:    07/03/23 1957 07/03/23 2347 07/04/23 0350 07/04/23 0716   BP: 135/66 (!) 124/57 (!) 145/69 101/68   Pulse: 64 61 82 67   Resp: 17 15 16 17    Temp: 36.6 C (97.9 F) 36.9 C (98.5 F) 36.2 C (97.2 F) 36.4 C (97.5 F)   SpO2: 95% 99% 92% 97%             Physical Exam:  On examination of the right knee there is a small area of drainage.  The wrist of the incision appears to be healed well.      Assessment and Plan:  Patient Active Problem List    Diagnosis    Septic joint    Knee swelling    Effusion of right knee joint    Lumbago    Sjogren syndrome    Hypothyroidism    Mild cognitive disorder    NICM (nonischemic cardiomyopathy) (CMS HCC)    Chronic combined systolic and diastolic CHF, NYHA class 2 (CMS HCC)    DOE (dyspnea on exertion)    Automatic implantable cardiac defibrillator in situ    Ventricular tachycardia (paroxysmal)    PAF (paroxysmal atrial fibrillation) (CMS HCC)    Essential hypertension      At this point the patient is not interested in surgical treatment.  She states she has had episodes like this where it has drained before in the past and gone away.  I recommend she continue with antibiotics per Infectious Disease.  She may follow up with us  in my office in a month.  If she starts to have further drainage become sick or changes her mind and would like to proceed with surgery we  would be happy to intervene.        Donnice Broach, DO     Portions of this note may be dictated using voice recognition software or a dictation service. Variances in spelling and vocabulary are possible and unintentional. Not all errors are caught/corrected. Please notify the dino if any discrepancies are noted or if the meaning of any statement is not clear.

## 2023-07-04 NOTE — Consults (Signed)
 Ana Kennedy  Date of service: 07/04/2023  Date of Admission:  07/02/2023  Hospital Day:  LOS: 2 days     Subjective:  Seen today, sitting in a chair, feeling better since on antibiotics    Review of Systems:    Review of Systems   All other systems reviewed and are negative.      Objective:      Vital Signs:  Vitals:    07/03/23 2347 07/04/23 0350 07/04/23 0716 07/04/23 1047   BP: (!) 124/57 (!) 145/69 101/68 134/65   Pulse: 61 82 67 64   Resp: 15 16 17 18    Temp: 36.9 C (98.5 F) 36.2 C (97.2 F) 36.4 C (97.5 F)    SpO2: 99% 92% 97% 98%   Weight:       Height:       BMI:                Medications:  Antibiotics (last 24 hours)       Date/Time Action Medication Dose Rate    07/03/23 1808 New Bag/New Syringe    DAPTOmycin  (CUBICIN ) 500 mg in NS 50 mL IVPB 500 mg 132 mL/hr          DAPTOmycin  (CUBICIN ) 500 mg in NS 50 mL IVPB, 8 mg/kg, Intravenous, QPM  heparin  5,000 unit/mL injection, 5,000 Units, Subcutaneous, Q8HRS  levothyroxine  (SYNTHROID ) tablet 137 mcg, 137 mcg, Oral, QAM  melatonin tablet, 6 mg, Oral, HS PRN  metoprolol  succinate (TOPROL -XL) 24 hr extended release tablet, 12.5 mg, Oral, Daily  sacubitril -valsartan  (ENTRESTO ) 24-26 mg per tablet, 1 Tablet, Oral, 2x/day        Physical Exam:    Physical Exam  Constitutional:       Appearance: Normal appearance.   HENT:      Head: Normocephalic and atraumatic.      Nose: Nose normal.   Eyes:      Conjunctiva/sclera: Conjunctivae normal.   Cardiovascular:      Rate and Rhythm: Normal rate.   Pulmonary:      Effort: Pulmonary effort is normal.   Abdominal:      Palpations: Abdomen is soft.   Musculoskeletal:         General: No tenderness.      Cervical back: Neck supple.   Skin:     General: Skin is warm.      Findings: Lesion present.   Neurological:      Mental Status: She is alert and oriented to person, place, and time. Mental status is at baseline.   Psychiatric:         Behavior: Behavior normal.          Imaging:         Labs:    Results for orders  placed or performed during the hospital encounter of 07/02/23 (from the past 24 hours)   CREATINE KINASE (CK), TOTAL, SERUM - TODAY   Result Value Ref Range    CREATINE KINASE 154 <=170 U/L   BASIC METABOLIC PANEL, FASTING   Result Value Ref Range    SODIUM 140 133 - 144 mmol/L    POTASSIUM 3.8 3.2 - 5.0 mmol/L    CHLORIDE 103 96 - 106 mmol/L    CO2 TOTAL 25 22 - 30 mmol/L    ANION GAP 12 7 - 18 mmol/L    CALCIUM 8.7 8.3 - 10.7 mg/dL    GLUCOSE 896 74 - 890 mg/dL    BUN 13 8 - 23 mg/dL  CREATININE 1.12 (H) 0.50 - 0.90 mg/dL    BUN/CREA RATIO 12     ESTIMATED GFR 48 (L) >90 mL/min/1.32m^2   MAGNESIUM   Result Value Ref Range    MAGNESIUM 2.0 1.6 - 2.4 mg/dL   CBC WITH DIFF   Result Value Ref Range    WBC 4.0 3.7 - 11.0 x10^3/uL    RBC 3.41 (L) 3.85 - 5.22 x10^6/uL    HGB 10.7 (L) 11.5 - 16.0 g/dL    HCT 67.3 (L) 65.1 - 46.0 %    MCV 95.6 78.0 - 100.0 fL    MCH 31.4 26.0 - 32.0 pg    MCHC 32.8 31.0 - 35.5 g/dL    RDW-CV 84.7 88.4 - 84.4 %    PLATELETS 177 150 - 400 x10^3/uL    MPV 9.7 8.7 - 12.5 fL    NEUTROPHIL % 71.6 %    LYMPHOCYTE % 14.3 %    MONOCYTE % 8.5 %    EOSINOPHIL % 4.3 %    BASOPHIL % 0.8 %    NEUTROPHIL # 2.85 1.50 - 7.70 x10^3/uL    LYMPHOCYTE # 0.57 (L) 1.00 - 4.80 x10^3/uL    MONOCYTE # 0.34 0.20 - 1.10 x10^3/uL    EOSINOPHIL # 0.17 <=0.50 x10^3/uL    BASOPHIL # <0.10 <=0.20 x10^3/uL    IMMATURE GRANULOCYTE % 0.5 0.0 - 1.0 %    IMMATURE GRANULOCYTE # <0.10 <0.10 x10^3/uL       Microbiology:  Hospital Encounter on 07/02/23 (from the past 96 hours)   ADULT ROUTINE BLOOD CULTURE, SET OF 2 ADULT BOTTLES (BACTERIA AND YEAST)    Collection Time: 07/02/23 12:52 PM    Specimen: Blood   Culture Result Status    BLOOD CULTURE, ROUTINE No Growth 2 Days Preliminary   ADULT ROUTINE BLOOD CULTURE, SET OF 2 ADULT BOTTLES (BACTERIA AND YEAST)    Collection Time: 07/02/23 12:52 PM    Specimen: Blood   Culture Result Status    BLOOD CULTURE, ROUTINE Abnormal Stain (AA) Preliminary    GRAM STAIN Gram Positive  Cocci/Clusters (A) Preliminary       Assessment/ Plan:   Active Hospital Problems    Diagnosis    Primary Problem: Septic joint    Knee swelling    Effusion of right knee joint    Hypothyroidism    PAF (paroxysmal atrial fibrillation) (CMS HCC)    NICM (nonischemic cardiomyopathy) (CMS HCC)    Chronic combined systolic and diastolic CHF, NYHA class 2 (CMS HCC)    Automatic implantable cardiac defibrillator in situ    Essential hypertension      88 years old lady seen today at bedside and chart reviewed and discussed with patient at bedside.  In addition discussed with daughter.  Discussed with Care management.  Reviewed with Infectious Disease pharmacist.  Patient has known history of recurrent staphylococcal infection, history of chronically infected knee replacement and history of pacemaker, who was placed on lifelong suppression by her Infectious Disease providers out of state, and maintained here, until she had possible breakthrough, and presented to the hospital complaining of knee swelling and redness and dehiscence.  She was started on daptomycin , due to blood cultures showing Gram-positive cocci in clusters, given known history of staphylococcal infection, until blood cultures are finalized, to determine if this is true bacteremia or contaminant.    Blood cultures were also repeated and will be monitoring.    Until final report of blood culture available to review, not ready  for discharge planning.  This was discussed with the patient and daughter.  Questions were answered .  Patient was also seen by Orthopedic service for evaluation of chronic septic arthritis of the knee, and recent drainage.  Consultation appreciated.  Labs were reviewed white count 4 hemoglobin 10 hematocrit 32 platelets 170, sedimentation rate 61 creatinine 1,1.  Continue daptomycin  500 mg daily for now.  On the day of the encounter, a total of   minutes was spent on this patient encounter including review of historical information,  examination, documentation and post-visit activities. The time documented excludes procedural time.  Labs,cultures,imaging,and notes reviewed.     Discussed with patient and family, Discussed with Infectious disease Pharmacists, Discussed with Care Team, and Discussed with Case Management    MDM Level  Problems addressed: MODERATE (>or=1 chronic illness w exacerbation, progression, or treatment side-effects; >or= 2 stable chronic illnesses; 1 undiagnosed new problem w uncertain prognosis; 1 acute illness w systemic symptoms; 1 acute complicated injury):    Data reviewed & analyzed: HIGH: 2 of 3 following: review of external notes, review of tests results, ordering of tests, assessment requiring an independent historian; independent interpretation of a test performed by another provider not separately reported; discussion of management or test interpretation w external provider:  Time needed for chart reviewed, labs and imaging reviewed, notes reviewed, antimicrobials reviewed, discussion with Infectious Disease Pharmacy, discussion with patient, and subsequently daughter, discussion with care management  Risk of morbidity: HIGH (ex: drug therapy requiring intensive monitoring, decision regarding major surgery with risk factors, decision regarding hospitalization or escalation of hospital level of care): IV antimicrobials which require intensive monitoring or high risk oral antimicrobial therapy which requires intensive monitoring on daptomycin  at this time requiring intensive monitoring of the patient and cultures and CK      Berna Bacon, MD

## 2023-07-04 NOTE — Assessment & Plan Note (Signed)
 Resume home dose levothyroxine.

## 2023-07-04 NOTE — Assessment & Plan Note (Signed)
 Blood cultures drawn on admission 1/2 bottles positive for growth of Gram-positive cocci in clusters  ID following recommended IV daptomycin  500 mg daily for now

## 2023-07-04 NOTE — Assessment & Plan Note (Signed)
 X-ray of the right knee shows joint effusion  Orthopedics consulted, discussed with patient's on operative versus non operative treatment recommended to continue antibiotics and will proceed with non operative treatment at this point.

## 2023-07-04 NOTE — Progress Notes (Signed)
 [facility]  Progress Note    Oddis Prost  Date of service: 07/04/2023   Date of Admission:  07/02/2023    Hospital Day:  LOS: 2 days   Subjective:  Patient seen and examined at bedside this morning.  Resting in bed without any distress.  Denies any worsening pain or swelling.  She is alert oriented x3 but hard of hearing    Vital Signs:  Temp  Avg: 36.6 C (97.8 F)  Min: 36.2 C (97.2 F)  Max: 36.9 C (98.5 F)    Pulse  Avg: 66.7  Min: 61  Max: 82 BP  Min: 101/68  Max: 145/69   Resp  Avg: 16.8  Min: 15  Max: 18 SpO2  Avg: 96.2 %  Min: 92 %  Max: 99 %          Input/Output    Intake/Output Summary (Last 24 hours) at 07/04/2023 1436  Last data filed at 07/04/2023 1200  Gross per 24 hour   Intake 647 ml   Output --   Net 647 ml    I/O last shift:  06/27 0700 - 06/27 1859  In: 360 [P.O.:360]  Out: -    DAPTOmycin  (CUBICIN ) 500 mg in NS 50 mL IVPB, 8 mg/kg, Intravenous, QPM  heparin  5,000 unit/mL injection, 5,000 Units, Subcutaneous, Q8HRS  levothyroxine  (SYNTHROID ) tablet 137 mcg, 137 mcg, Oral, QAM  melatonin tablet, 6 mg, Oral, HS PRN  metoprolol  succinate (TOPROL -XL) 24 hr extended release tablet, 12.5 mg, Oral, Daily  sacubitril -valsartan  (ENTRESTO ) 24-26 mg per tablet, 1 Tablet, Oral, 2x/day        Physical Exam:    Age-appropriate looking elderly female , Not in acute distress   Cardiovascular: RRR, no murmurs  Respiratory: Clear to auscultation.  No wheezing  Abdomen: Soft, nontender, nondistended.  No  organomegaly  Neurologic: Alert and oriented x 3, moving all extremities.  Extremities-serous discharge from right knee wound    Labs:     Results for orders placed or performed during the hospital encounter of 07/02/23 (from the past 24 hours)   CREATINE KINASE (CK), TOTAL, SERUM - TODAY   Result Value Ref Range    CREATINE KINASE 154 <=170 U/L   BASIC METABOLIC PANEL, FASTING   Result Value Ref Range    SODIUM 140 133 - 144 mmol/L    POTASSIUM 3.8 3.2 - 5.0 mmol/L    CHLORIDE 103 96 - 106  mmol/L    CO2 TOTAL 25 22 - 30 mmol/L    ANION GAP 12 7 - 18 mmol/L    CALCIUM 8.7 8.3 - 10.7 mg/dL    GLUCOSE 896 74 - 890 mg/dL    BUN 13 8 - 23 mg/dL    CREATININE 8.87 (H) 0.50 - 0.90 mg/dL    BUN/CREA RATIO 12     ESTIMATED GFR 48 (L) >90 mL/min/1.87m^2   MAGNESIUM   Result Value Ref Range    MAGNESIUM 2.0 1.6 - 2.4 mg/dL   CBC WITH DIFF   Result Value Ref Range    WBC 4.0 3.7 - 11.0 x10^3/uL    RBC 3.41 (L) 3.85 - 5.22 x10^6/uL    HGB 10.7 (L) 11.5 - 16.0 g/dL    HCT 67.3 (L) 65.1 - 46.0 %    MCV 95.6 78.0 - 100.0 fL    MCH 31.4 26.0 - 32.0 pg    MCHC 32.8 31.0 - 35.5 g/dL    RDW-CV 84.7 88.4 - 84.4 %  PLATELETS 177 150 - 400 x10^3/uL    MPV 9.7 8.7 - 12.5 fL    NEUTROPHIL % 71.6 %    LYMPHOCYTE % 14.3 %    MONOCYTE % 8.5 %    EOSINOPHIL % 4.3 %    BASOPHIL % 0.8 %    NEUTROPHIL # 2.85 1.50 - 7.70 x10^3/uL    LYMPHOCYTE # 0.57 (L) 1.00 - 4.80 x10^3/uL    MONOCYTE # 0.34 0.20 - 1.10 x10^3/uL    EOSINOPHIL # 0.17 <=0.50 x10^3/uL    BASOPHIL # <0.10 <=0.20 x10^3/uL    IMMATURE GRANULOCYTE % 0.5 0.0 - 1.0 %    IMMATURE GRANULOCYTE # <0.10 <0.10 x10^3/uL      Radiology:      Results for orders placed or performed during the hospital encounter of 07/02/23 (from the past 2160 hours)   XR KNEE RIGHT 3 VIEW     Status: None    Narrative    Meliah Klang    XR KNEE RIGHT 3 VIEW performed on 07/02/2023 12:20 PM.    INDICATION:  swelling   Additional History:  septic joint, wound/weeping mid anterior right knee, hx of multiple right knee surgeries, sepsis and psoriatic arthritis    TECHNIQUE:  3 views/3 images of the right knee submitted for interpretation.    COMPARISON:  None available.  ___________________________________  FINDINGS:    Right knee replacement noted in anatomic alignment. Hardware intact. No lucency around the hardware. No acute fracture or dislocation. Small joint effusion. Generalized soft tissue swelling present.      Impression    Soft tissue swelling, and joint effusion.    No  fracture.        Radiologist location ID: [id number]        Consults:  Orthopedics, ID  Assessment/ Plan:   Assessment & Plan  Septic joint  Presents with increased swelling, erythematous skin changes and discharge from the right knee  -elevated CRP  ID following recommend to do IV daptomycin  daily for now   Gram-positive cocci bacteremia  Blood cultures drawn on admission 1/2 bottles positive for growth of Gram-positive cocci in clusters  ID following recommended IV daptomycin  500 mg daily for now  Effusion of right knee joint  Knee swelling  X-ray of the right knee shows joint effusion  Orthopedics consulted, discussed with patient's on operative versus non operative treatment recommended to continue antibiotics and will proceed with non operative treatment at this point.  PAF (paroxysmal atrial fibrillation) (CMS HCC)  Currently rate controlled.    Resume metoprolol  25 mg daily  Appears not on anticoagulation  Chronic combined systolic and diastolic CHF, NYHA class 2 (CMS HCC)  Hemodynamically stable, O2 sats stable on room air.    No signs of fluid overload, BNP 555  Resume home dose Entresto  b.i.d.  NICM (nonischemic cardiomyopathy) (CMS HCC)  Automatic implantable cardiac defibrillator in situ    Essential hypertension  Resume metoprolol  and Entresto   Hypothyroidism  Resume home dose levothyroxine          DVT/PE Prophylaxis: Heparin     Marthe Frohlich, MD,

## 2023-07-04 NOTE — Care Plan (Signed)
 Problem: Adult Inpatient Plan of Care  Goal: Plan of Care Review  Outcome: Ongoing (see interventions/notes)  Goal: Patient-Specific Goal (Individualized)  Outcome: Ongoing (see interventions/notes)  Goal: Absence of Hospital-Acquired Illness or Injury  Outcome: Ongoing (see interventions/notes)  Intervention: Identify and Manage Fall Risk  Recent Flowsheet Documentation  Taken 07/04/2023 0700 by Ileana RAMAN, RN  Safety Promotion/Fall Prevention:   activity supervised   fall prevention program maintained   nonskid shoes/slippers when out of bed   safety round/check completed  Goal: Optimal Comfort and Wellbeing  Outcome: Ongoing (see interventions/notes)  Goal: Rounds/Family Conference  Outcome: Ongoing (see interventions/notes)     Problem: Fall Injury Risk  Goal: Absence of Fall and Fall-Related Injury  Outcome: Ongoing (see interventions/notes)  Intervention: Promote Injury-Free Environment  Recent Flowsheet Documentation  Taken 07/04/2023 0700 by Ileana RAMAN, RN  Safety Promotion/Fall Prevention:   activity supervised   fall prevention program maintained   nonskid shoes/slippers when out of bed   safety round/check completed     Problem: Infection  Goal: Absence of Infection Signs and Symptoms  Outcome: Ongoing (see interventions/notes)

## 2023-07-04 NOTE — Assessment & Plan Note (Signed)
 Resume metoprolol  and Entresto 

## 2023-07-04 NOTE — Assessment & Plan Note (Signed)
 Presents with increased swelling, erythematous skin changes and discharge from the right knee  -elevated CRP  ID following recommend to do IV daptomycin  daily for now

## 2023-07-04 NOTE — Care Management Notes (Signed)
 Patient's discharge plan is to return home with family support.  Patient requests [facility] home health, referral sent in careport.  Please notify CM and provide prescription if patient will require IV abx so this can be arranged.

## 2023-07-04 NOTE — Assessment & Plan Note (Signed)
 Hemodynamically stable, O2 sats stable on room air.    No signs of fluid overload, BNP 555  Resume home dose Entresto  b.i.d.

## 2023-07-04 NOTE — Assessment & Plan Note (Signed)
 Currently rate controlled.    Resume metoprolol  25 mg daily  Appears not on anticoagulation

## 2023-07-05 LAB — ADULT ROUTINE BLOOD CULTURE, SET OF 2 BOTTLES (BACTERIA AND YEAST)

## 2023-07-05 NOTE — Progress Notes (Signed)
 [facility]  Progress Note    Ana Kennedy  Date of service: 07/05/2023   Date of Admission:  07/02/2023    Hospital Day:  LOS: 3 days   Interval history- 88 year old female with known history of hypothyroidism and osteoarthritis with knee replacement x2 last 1 years ago presented to the hospital complaining of discharge from the knee joint.  Concern for septic arthritis orthopedics consulted recommended given her age conservative management and if clinically gets worse would intervene. Infectious disease consulted recommended daptomycin  for now      Subjective:  Patient seen and examined at bedside this morning.  Resting in bed without any distress.  Denies any worsening pain or swelling.  She is alert oriented x3 but hard of hearing    Vital Signs:  Temp  Avg: 36.4 C (97.5 F)  Min: 36.2 C (97.1 F)  Max: 36.7 C (98 F)    Pulse  Avg: 64.5  Min: 60  Max: 69 BP  Min: 102/55  Max: 135/67   Resp  Avg: 16.7  Min: 15  Max: 18 SpO2  Avg: 96.2 %  Min: 94 %  Max: 97 %          Input/Output    Intake/Output Summary (Last 24 hours) at 07/05/2023 1237  Last data filed at 07/05/2023 0800  Gross per 24 hour   Intake 666 ml   Output --   Net 666 ml    I/O last shift:  06/28 0700 - 06/28 1859  In: 360 [P.O.:360]  Out: -    DAPTOmycin  (CUBICIN ) 500 mg in NS 50 mL IVPB, 8 mg/kg, Intravenous, QPM  heparin  5,000 unit/mL injection, 5,000 Units, Subcutaneous, Q8HRS  levothyroxine  (SYNTHROID ) tablet 137 mcg, 137 mcg, Oral, QAM  melatonin tablet, 6 mg, Oral, HS PRN  metoprolol  succinate (TOPROL -XL) 24 hr extended release tablet, 12.5 mg, Oral, Daily  sacubitril -valsartan  (ENTRESTO ) 24-26 mg per tablet, 1 Tablet, Oral, 2x/day        Physical Exam:    Age-appropriate looking elderly female , Not in acute distress   Cardiovascular: RRR, no murmurs  Respiratory: Clear to auscultation.  No wheezing  Abdomen: Soft, nontender, nondistended.  No  organomegaly  Neurologic: Alert and oriented x 3, moving all  extremities.  Extremities-serous discharge from right knee wound    Labs:     No results found for any visits on 07/02/23 (from the past 24 hours).     Radiology:      Results for orders placed or performed during the hospital encounter of 07/02/23 (from the past 2160 hours)   XR KNEE RIGHT 3 VIEW     Status: None    Narrative    Ana Kennedy    XR KNEE RIGHT 3 VIEW performed on 07/02/2023 12:20 PM.    INDICATION:  swelling   Additional History:  septic joint, wound/weeping mid anterior right knee, hx of multiple right knee surgeries, sepsis and psoriatic arthritis    TECHNIQUE:  3 views/3 images of the right knee submitted for interpretation.    COMPARISON:  None available.  ___________________________________  FINDINGS:    Right knee replacement noted in anatomic alignment. Hardware intact. No lucency around the hardware. No acute fracture or dislocation. Small joint effusion. Generalized soft tissue swelling present.      Impression    Soft tissue swelling, and joint effusion.    No fracture.        Radiologist location ID: [id number]  Consults:  Orthopedics, ID  Assessment/ Plan:   Assessment & Plan  Septic joint  Presents with increased swelling, erythematous skin changes and discharge from the right knee  -elevated CRP  ID following recommend to do IV daptomycin  daily for now   Gram-positive cocci bacteremia  Blood cultures drawn on admission 1/2 bottles positive for growth of Staphylococcus hominis coagulase-negative Staphylococcus likely contaminant  ID following recommended IV daptomycin  500 mg daily for now  Effusion of right knee joint  Knee swelling  X-ray of the right knee shows joint effusion  Orthopedics consulted, discussed with patient's on operative versus non operative treatment recommended to continue antibiotics and will proceed with non operative treatment at this point.  PAF (paroxysmal atrial fibrillation) (CMS HCC)  Currently rate controlled.    Resume metoprolol  25 mg daily  Appears  not on anticoagulation  Chronic combined systolic and diastolic CHF, NYHA class 2 (CMS HCC)  Hemodynamically stable, O2 sats stable on room air.    No signs of fluid overload, BNP 555  Resume home dose Entresto  b.i.d.  NICM (nonischemic cardiomyopathy) (CMS HCC)  Automatic implantable cardiac defibrillator in situ    Essential hypertension  Resume metoprolol  and Entresto   Hypothyroidism  Resume home dose levothyroxine          DVT/PE Prophylaxis: Heparin     Marthe Frohlich, MD,

## 2023-07-05 NOTE — Care Plan (Signed)
 Problem: Adult Inpatient Plan of Care  Goal: Plan of Care Review  Outcome: Ongoing (see interventions/notes)  Goal: Patient-Specific Goal (Individualized)  Outcome: Ongoing (see interventions/notes)  Goal: Absence of Hospital-Acquired Illness or Injury  Outcome: Ongoing (see interventions/notes)  Goal: Optimal Comfort and Wellbeing  Outcome: Ongoing (see interventions/notes)  Goal: Rounds/Family Conference  Outcome: Ongoing (see interventions/notes)     Problem: Health Knowledge, Opportunity to Enhance (Adult,Obstetrics,Pediatric)  Goal: Knowledgeable about Health Subject/Topic  Description: Patient will demonstrate the desired outcomes by discharge/transition of care.  Outcome: Ongoing (see interventions/notes)     Problem: Wound  Goal: Optimal Coping  Outcome: Ongoing (see interventions/notes)  Goal: Optimal Functional Ability  Outcome: Ongoing (see interventions/notes)  Goal: Absence of Infection Signs and Symptoms  Outcome: Ongoing (see interventions/notes)  Goal: Improved Oral Intake  Outcome: Ongoing (see interventions/notes)  Goal: Optimal Pain Control and Function  Outcome: Ongoing (see interventions/notes)  Goal: Skin Health and Integrity  Outcome: Ongoing (see interventions/notes)  Goal: Optimal Wound Healing  Outcome: Ongoing (see interventions/notes)

## 2023-07-05 NOTE — Assessment & Plan Note (Signed)
 Presents with increased swelling, erythematous skin changes and discharge from the right knee  -elevated CRP  ID following recommend to do IV daptomycin  daily for now

## 2023-07-05 NOTE — Assessment & Plan Note (Signed)
 Hemodynamically stable, O2 sats stable on room air.    No signs of fluid overload, BNP 555  Resume home dose Entresto  b.i.d.

## 2023-07-05 NOTE — Assessment & Plan Note (Signed)
 Resume home dose levothyroxine.

## 2023-07-05 NOTE — Assessment & Plan Note (Signed)
 Currently rate controlled.    Resume metoprolol  25 mg daily  Appears not on anticoagulation

## 2023-07-05 NOTE — Assessment & Plan Note (Signed)
 X-ray of the right knee shows joint effusion  Orthopedics consulted, discussed with patient's on operative versus non operative treatment recommended to continue antibiotics and will proceed with non operative treatment at this point.

## 2023-07-05 NOTE — Assessment & Plan Note (Signed)
 Resume metoprolol  and Entresto 

## 2023-07-05 NOTE — Care Plan (Signed)
 Problem: Adult Inpatient Plan of Care  Goal: Plan of Care Review  Outcome: Ongoing (see interventions/notes)  Goal: Patient-Specific Goal (Individualized)  Outcome: Ongoing (see interventions/notes)  Flowsheets (Taken 07/04/2023 2005)  Individualized Care Needs: assist to bathroom  Anxieties, Fears or Concerns: going back home  Goal: Absence of Hospital-Acquired Illness or Injury  Outcome: Ongoing (see interventions/notes)  Intervention: Identify and Manage Fall Risk  Recent Flowsheet Documentation  Taken 07/05/2023 0100 by Berwyn MATSU, RN  Safety Promotion/Fall Prevention:   activity supervised   fall prevention program maintained   safety round/check completed  Taken 07/04/2023 2300 by Berwyn MATSU, RN  Safety Promotion/Fall Prevention:   activity supervised   fall prevention program maintained   safety round/check completed   nonskid shoes/slippers when out of bed  Taken 07/04/2023 2005 by Berwyn MATSU, RN  Safety Promotion/Fall Prevention:   activity supervised   fall prevention program maintained   muscle strengthening facilitated   safety round/check completed   nonskid shoes/slippers when out of bed  Intervention: Prevent Skin Injury  Recent Flowsheet Documentation  Taken 07/04/2023 2005 by Berwyn MATSU, RN  Body Position: sitting  Skin Protection: adhesive use limited  Intervention: Prevent and Manage VTE (Venous Thromboembolism) Risk  Recent Flowsheet Documentation  Taken 07/04/2023 2005 by Berwyn MATSU, RN  VTE Prevention/Management: anticoagulant therapy maintained  Intervention: Prevent Infection  Recent Flowsheet Documentation  Taken 07/04/2023 2005 by Berwyn MATSU, RN  Infection Prevention:   cohorting utilized   promote handwashing  Goal: Optimal Comfort and Wellbeing  Outcome: Ongoing (see interventions/notes)  Intervention: Provide Person-Centered Care  Recent Flowsheet Documentation  Taken 07/04/2023 2005 by Berwyn MATSU, RN  Trust Relationship/Rapport:   care explained   choices provided   questions answered   questions  encouraged   reassurance provided  Goal: Rounds/Family Conference  Outcome: Ongoing (see interventions/notes)     Problem: Health Knowledge, Opportunity to Enhance (Adult,Obstetrics,Pediatric)  Goal: Knowledgeable about Health Subject/Topic  Description: Patient will demonstrate the desired outcomes by discharge/transition of care.  Outcome: Ongoing (see interventions/notes)  Intervention: Enhance Health Knowledge  Recent Flowsheet Documentation  Taken 07/04/2023 2005 by Berwyn MATSU, RN  Supportive Measures:   active listening utilized   positive reinforcement provided     Problem: Wound  Goal: Optimal Coping  Outcome: Ongoing (see interventions/notes)  Intervention: Support Patient and Family Response  Recent Flowsheet Documentation  Taken 07/04/2023 2005 by Berwyn MATSU, RN  Supportive Measures:   active listening utilized   positive reinforcement provided  Goal: Optimal Functional Ability  Outcome: Ongoing (see interventions/notes)  Intervention: Optimize Functional Ability  Recent Flowsheet Documentation  Taken 07/04/2023 2005 by Berwyn MATSU, RN  Activity Management: ROM, active encouraged  Activity Assistance Provided: assistance, 1 person  Assistive Device Utilized: walker  Goal: Absence of Infection Signs and Symptoms  Outcome: Ongoing (see interventions/notes)  Intervention: Prevent or Manage Infection  Recent Flowsheet Documentation  Taken 07/04/2023 2005 by Berwyn MATSU, RN  Infection Management: aseptic technique maintained  Goal: Improved Oral Intake  Outcome: Ongoing (see interventions/notes)  Goal: Optimal Pain Control and Function  Outcome: Ongoing (see interventions/notes)  Goal: Skin Health and Integrity  Outcome: Ongoing (see interventions/notes)  Intervention: Optimize Skin Protection  Recent Flowsheet Documentation  Taken 07/04/2023 2005 by Berwyn MATSU, RN  Pressure Reduction Techniques: Frequent weight shifting encouraged  Pressure Reduction Devices: Repositioning wedges/pillows utilized  Activity Management:  ROM, active encouraged  Goal: Optimal Wound Healing  Outcome: Ongoing (see interventions/notes)  Intervention: Promote Wound Healing  Recent Flowsheet Documentation  Taken 07/04/2023 2005 by Berwyn MATSU, RN  Pressure Reduction Techniques: Frequent weight shifting encouraged  Pressure Reduction Devices: Repositioning wedges/pillows utilized  Activity Management: ROM, active encouraged     Problem: Fall Injury Risk  Goal: Absence of Fall and Fall-Related Injury  Outcome: Ongoing (see interventions/notes)  Intervention: Identify and Manage Contributors  Recent Flowsheet Documentation  Taken 07/04/2023 2005 by Berwyn MATSU, RN  Self-Care Promotion: independence encouraged  Medication Review/Management: medications reviewed  Intervention: Promote Injury-Free Environment  Recent Flowsheet Documentation  Taken 07/05/2023 0100 by Berwyn MATSU, RN  Safety Promotion/Fall Prevention:   activity supervised   fall prevention program maintained   safety round/check completed  Taken 07/04/2023 2300 by Berwyn MATSU, RN  Safety Promotion/Fall Prevention:   activity supervised   fall prevention program maintained   safety round/check completed   nonskid shoes/slippers when out of bed  Taken 07/04/2023 2005 by Berwyn MATSU, RN  Safety Promotion/Fall Prevention:   activity supervised   fall prevention program maintained   muscle strengthening facilitated   safety round/check completed   nonskid shoes/slippers when out of bed     Problem: Infection  Goal: Absence of Infection Signs and Symptoms  Outcome: Ongoing (see interventions/notes)  Intervention: Prevent or Manage Infection  Recent Flowsheet Documentation  Taken 07/04/2023 2005 by Berwyn MATSU, RN  Infection Management: aseptic technique maintained   Dressing on knee intact and drainage free. Is a 1 assist for transfers and rolling walker. Remains confused. Continues antibiotics iv. Plan is hone with home health.   Ana Vickers, RN  07/05/2023 05:50

## 2023-07-05 NOTE — Assessment & Plan Note (Signed)
 Blood cultures drawn on admission 1/2 bottles positive for growth of Staphylococcus hominis coagulase-negative Staphylococcus likely contaminant  ID following recommended IV daptomycin  500 mg daily for now

## 2023-07-06 DIAGNOSIS — I480 Paroxysmal atrial fibrillation: Secondary | ICD-10-CM

## 2023-07-06 DIAGNOSIS — I5042 Chronic combined systolic (congestive) and diastolic (congestive) heart failure: Secondary | ICD-10-CM

## 2023-07-06 DIAGNOSIS — Z95810 Presence of automatic (implantable) cardiac defibrillator: Secondary | ICD-10-CM

## 2023-07-06 DIAGNOSIS — R7881 Bacteremia: Secondary | ICD-10-CM

## 2023-07-06 DIAGNOSIS — I110 Hypertensive heart disease with heart failure: Secondary | ICD-10-CM

## 2023-07-06 DIAGNOSIS — I428 Other cardiomyopathies: Secondary | ICD-10-CM

## 2023-07-06 DIAGNOSIS — E039 Hypothyroidism, unspecified: Secondary | ICD-10-CM

## 2023-07-06 NOTE — Assessment & Plan Note (Signed)
 Right knee, Presents with increased swelling, erythematous skin changes and discharge from the right knee  -elevated CRP  ID following recommend to do IV daptomycin  daily for now

## 2023-07-06 NOTE — Assessment & Plan Note (Signed)
 Resume metoprolol  and Entresto 

## 2023-07-06 NOTE — Assessment & Plan Note (Signed)
 Hemodynamically stable, O2 sats stable on room air.    No signs of fluid overload, BNP 555  Resume home dose Entresto  b.i.d.

## 2023-07-06 NOTE — Care Plan (Signed)
 Problem: Adult Inpatient Plan of Care  Goal: Plan of Care Review  Outcome: Ongoing (see interventions/notes)  Goal: Patient-Specific Goal (Individualized)  Outcome: Ongoing (see interventions/notes)  Flowsheets (Taken 07/06/2023 0926)  Individualized Care Needs: go home tomorrow  Anxieties, Fears or Concerns: falling  Goal: Absence of Hospital-Acquired Illness or Injury  Outcome: Ongoing (see interventions/notes)  Intervention: Identify and Manage Fall Risk  Recent Flowsheet Documentation  Taken 07/06/2023 0926 by Edsel SQUIBB, RN  Safety Promotion/Fall Prevention:   activity supervised   fall prevention program maintained   nonskid shoes/slippers when out of bed   safety round/check completed  Taken 07/06/2023 0700 by Edsel SQUIBB, RN  Safety Promotion/Fall Prevention:   safety round/check completed   nonskid shoes/slippers when out of bed  Intervention: Prevent Skin Injury  Recent Flowsheet Documentation  Taken 07/06/2023 1200 by Edsel SQUIBB, RN  Skin Protection: adhesive use limited  Intervention: Prevent and Manage VTE (Venous Thromboembolism) Risk  Recent Flowsheet Documentation  Taken 07/06/2023 1200 by Edsel SQUIBB, RN  VTE Prevention/Management: ambulation promoted  Intervention: Prevent Infection  Recent Flowsheet Documentation  Taken 07/06/2023 0926 by Edsel SQUIBB, RN  Infection Prevention:   promote handwashing   rest/sleep promoted  Goal: Optimal Comfort and Wellbeing  Outcome: Ongoing (see interventions/notes)  Intervention: Provide Person-Centered Care  Recent Flowsheet Documentation  Taken 07/06/2023 9073 by Edsel SQUIBB, RN  Trust Relationship/Rapport:   care explained   emotional support provided   questions encouraged  Goal: Rounds/Family Conference  Outcome: Ongoing (see interventions/notes)     Problem: Health Knowledge, Opportunity to Enhance (Adult,Obstetrics,Pediatric)  Goal: Knowledgeable about Health Subject/Topic  Description: Patient will demonstrate the desired outcomes by discharge/transition of  care.  Outcome: Ongoing (see interventions/notes)  Intervention: Enhance Health Knowledge  Recent Flowsheet Documentation  Taken 07/06/2023 0926 by Edsel SQUIBB, RN  Family/Support System Care:   presence promoted   self-care encouraged     Problem: Wound  Goal: Optimal Coping  Outcome: Ongoing (see interventions/notes)  Intervention: Support Patient and Family Response  Recent Flowsheet Documentation  Taken 07/06/2023 0926 by Edsel SQUIBB, RN  Family/Support System Care:   presence promoted   self-care encouraged  Goal: Optimal Functional Ability  Outcome: Ongoing (see interventions/notes)  Goal: Absence of Infection Signs and Symptoms  Outcome: Ongoing (see interventions/notes)  Intervention: Prevent or Manage Infection  Recent Flowsheet Documentation  Taken 07/06/2023 0926 by Edsel SQUIBB, RN  Infection Management: aseptic technique maintained  Goal: Improved Oral Intake  Outcome: Ongoing (see interventions/notes)  Goal: Optimal Pain Control and Function  Outcome: Ongoing (see interventions/notes)  Goal: Skin Health and Integrity  Outcome: Ongoing (see interventions/notes)  Intervention: Optimize Skin Protection  Recent Flowsheet Documentation  Taken 07/06/2023 1200 by Edsel SQUIBB, RN  Pressure Reduction Techniques:   Mobility is maximized   Frequent weight shifting encouraged  Pressure Reduction Devices: Repositioning wedges/pillows utilized  Goal: Optimal Wound Healing  Outcome: Ongoing (see interventions/notes)  Intervention: Promote Wound Healing  Recent Flowsheet Documentation  Taken 07/06/2023 1200 by Edsel SQUIBB, RN  Pressure Reduction Techniques:   Mobility is maximized   Frequent weight shifting encouraged  Pressure Reduction Devices: Repositioning wedges/pillows utilized  Dressing & skin monitored     Problem: Fall Injury Risk  Goal: Absence of Fall and Fall-Related Injury  Outcome: Ongoing (see interventions/notes)  Intervention: Identify and Manage Contributors  Recent Flowsheet Documentation  Taken 07/06/2023 0926  by Edsel SQUIBB, RN  Medication Review/Management: medications reviewed  Intervention: Promote Injury-Free Environment  Recent Flowsheet Documentation  Taken 07/06/2023 [phone] by  Edsel SQUIBB, RN  Safety Promotion/Fall Prevention:   activity supervised   fall prevention program maintained   nonskid shoes/slippers when out of bed   safety round/check completed  Taken 07/06/2023 0700 by Edsel SQUIBB, RN  Safety Promotion/Fall Prevention:   safety round/check completed   nonskid shoes/slippers when out of bed     Problem: Infection  Goal: Absence of Infection Signs and Symptoms  Outcome: Ongoing (see interventions/notes)  Intervention: Prevent or Manage Infection  Recent Flowsheet Documentation  Taken 07/06/2023 0926 by Edsel SQUIBB, RN  Infection Management: aseptic technique maintained

## 2023-07-06 NOTE — Progress Notes (Signed)
 [facility]  Progress Note    Oddis Prost  Date of service: 07/06/2023   Date of Admission:  07/02/2023    Hospital Day:  LOS: 4 days   Interval history- 88 year old female with known history of hypothyroidism and osteoarthritis with knee replacement x2 last 1 years ago presented to the hospital complaining of discharge from the knee joint.  Concern for septic arthritis orthopedics consulted recommended given her age conservative management and if clinically gets worse would intervene. Infectious disease consulted recommended daptomycin  for now      Subjective:  Patient seen and examined at bedside this morning.  Resting in bed without any distress.  Denies any worsening pain or swelling.  She is alert oriented x3 but hard of hearing.  No more further drainage from the knee.  Patient has increased redness on the dorsal part of the left foot    Vital Signs:  Temp  Avg: 36.7 C (98.1 F)  Min: 36.6 C (97.9 F)  Max: 36.8 C (98.2 F)    Pulse  Avg: 66.3  Min: 61  Max: 86 BP  Min: 130/64  Max: 141/77   Resp  Avg: 17.2  Min: 16  Max: 18 SpO2  Avg: 95 %  Min: 90 %  Max: 100 %          Input/Output    Intake/Output Summary (Last 24 hours) at 07/06/2023 1343  Last data filed at 07/06/2023 1300  Gross per 24 hour   Intake 650 ml   Output --   Net 650 ml    I/O last shift:  06/29 0700 - 06/29 1859  In: 480 [P.O.:480]  Out: -    DAPTOmycin  (CUBICIN ) 500 mg in NS 50 mL IVPB, 8 mg/kg, Intravenous, QPM  heparin  5,000 unit/mL injection, 5,000 Units, Subcutaneous, Q8HRS  levothyroxine  (SYNTHROID ) tablet 137 mcg, 137 mcg, Oral, QAM  melatonin tablet, 6 mg, Oral, HS PRN  metoprolol  succinate (TOPROL -XL) 24 hr extended release tablet, 12.5 mg, Oral, Daily  sacubitril -valsartan  (ENTRESTO ) 24-26 mg per tablet, 1 Tablet, Oral, 2x/day        Physical Exam:    Age-appropriate looking elderly female , Not in acute distress   Cardiovascular: RRR, no murmurs  Respiratory: Clear to auscultation.  No wheezing  Abdomen: Soft,  nontender, nondistended.  No  organomegaly  Neurologic: Alert and oriented x 3, moving all extremities.  Extremities-serous discharge from right knee wound    Labs:     No results found for any visits on 07/02/23 (from the past 24 hours).     Radiology:      Results for orders placed or performed during the hospital encounter of 07/02/23 (from the past 2160 hours)   XR KNEE RIGHT 3 VIEW     Status: None    Narrative    Emelyn Sun    XR KNEE RIGHT 3 VIEW performed on 07/02/2023 12:20 PM.    INDICATION:  swelling   Additional History:  septic joint, wound/weeping mid anterior right knee, hx of multiple right knee surgeries, sepsis and psoriatic arthritis    TECHNIQUE:  3 views/3 images of the right knee submitted for interpretation.    COMPARISON:  None available.  ___________________________________  FINDINGS:    Right knee replacement noted in anatomic alignment. Hardware intact. No lucency around the hardware. No acute fracture or dislocation. Small joint effusion. Generalized soft tissue swelling present.      Impression    Soft tissue swelling, and joint effusion.  No fracture.        Radiologist location ID: [id number]        Consults:  Orthopedics, ID  Assessment/ Plan:   Assessment & Plan  Septic joint  Right knee, Presents with increased swelling, erythematous skin changes and discharge from the right knee  -elevated CRP  ID following recommend to do IV daptomycin  daily for now   Gram-positive cocci bacteremia  Blood cultures drawn on admission 1/2 bottles positive for growth of Staphylococcus hominis coagulase-negative Staphylococcus likely contaminant  ID following recommended IV daptomycin  500 mg daily for now.  I discussed management with Infectious Disease and they will decide about antibiotics by tomorrow  Effusion of right knee joint  Knee swelling  X-ray of the right knee shows joint effusion  Orthopedics consulted, discussed with patient's on operative versus non operative treatment recommended  to continue antibiotics and will proceed with non operative treatment at this point.  PAF (paroxysmal atrial fibrillation) (CMS HCC)  Currently rate controlled.    Resume metoprolol  25 mg daily  Appears not on anticoagulation  Chronic combined systolic and diastolic CHF, NYHA class 2 (CMS HCC)  Hemodynamically stable, O2 sats stable on room air.    No signs of fluid overload, BNP 555  Resume home dose Entresto  b.i.d.  NICM (nonischemic cardiomyopathy) (CMS HCC)  Automatic implantable cardiac defibrillator in situ    Essential hypertension  Resume metoprolol  and Entresto   Hypothyroidism  Resume home dose levothyroxine          DVT/PE Prophylaxis: Heparin     Payton Atlas MD [facility] (Fellow of Hospital Medicine)

## 2023-07-06 NOTE — Nurses Notes (Addendum)
 Upon rounding patient resting comfortably in bed. A/o x4. Bed alarm on.   Patient denies any concerns at this time.

## 2023-07-06 NOTE — Assessment & Plan Note (Signed)
 Blood cultures drawn on admission 1/2 bottles positive for growth of Staphylococcus hominis coagulase-negative Staphylococcus likely contaminant  ID following recommended IV daptomycin  500 mg daily for now.  I discussed management with Infectious Disease and they will decide about antibiotics by tomorrow

## 2023-07-06 NOTE — Assessment & Plan Note (Signed)
 X-ray of the right knee shows joint effusion  Orthopedics consulted, discussed with patient's on operative versus non operative treatment recommended to continue antibiotics and will proceed with non operative treatment at this point.

## 2023-07-06 NOTE — Assessment & Plan Note (Signed)
 Resume home dose levothyroxine.

## 2023-07-06 NOTE — Assessment & Plan Note (Signed)
 Currently rate controlled.    Resume metoprolol  25 mg daily  Appears not on anticoagulation

## 2023-07-06 NOTE — Consults (Signed)
 Oddis Prost  Date of service: 07/06/2023  Date of Admission:  07/02/2023  Hospital Day:  LOS: 4 days     Subjective:  Seen this morning at bedside, and RN at bedside.  Patient had no concerns.  There was some erythema on the left foot, periungual, hallux, and between 2nd and 3rd toe.  Did not report symptoms pertaining to this findings.  Reported that drainage from the knee has improved with current treatment.    Review of Systems:    Review of Systems   All other systems reviewed and are negative.      Objective:      Vital Signs:  Vitals:    07/06/23 0400 07/06/23 0652 07/06/23 1054 07/06/23 1055   BP: (!) 141/77 (!) 132/59 (!) 132/59 (!) 132/59   Pulse: 86 61 64    Resp: 16 18     Temp: 36.6 C (97.9 F) 36.7 C (98.1 F)     SpO2: 90% 100%     Weight:       Height:       BMI:                Medications:  Antibiotics (last 24 hours)       Date/Time Action Medication Dose Rate    07/05/23 2029 New Bag/New Syringe    DAPTOmycin  (CUBICIN ) 500 mg in NS 50 mL IVPB 500 mg 132 mL/hr          DAPTOmycin  (CUBICIN ) 500 mg in NS 50 mL IVPB, 8 mg/kg, Intravenous, QPM  heparin  5,000 unit/mL injection, 5,000 Units, Subcutaneous, Q8HRS  levothyroxine  (SYNTHROID ) tablet 137 mcg, 137 mcg, Oral, QAM  melatonin tablet, 6 mg, Oral, HS PRN  metoprolol  succinate (TOPROL -XL) 24 hr extended release tablet, 12.5 mg, Oral, Daily  sacubitril -valsartan  (ENTRESTO ) 24-26 mg per tablet, 1 Tablet, Oral, 2x/day        Physical Exam:    Physical Exam  HENT:      Head: Normocephalic.      Nose: Nose normal.   Cardiovascular:      Rate and Rhythm: Normal rate.   Pulmonary:      Effort: Pulmonary effort is normal. No respiratory distress.   Abdominal:      Palpations: Abdomen is soft.   Musculoskeletal:         General: No tenderness.      Cervical back: Neck supple.   Skin:     General: Skin is warm.   Neurological:      Mental Status: She is alert. Mental status is at baseline.   Psychiatric:         Behavior: Behavior normal.          Imaging:          Labs:    No results found for any visits on 07/02/23 (from the past 24 hours).    Microbiology:  Hospital Encounter on 07/02/23 (from the past 96 hours)   ADULT ROUTINE BLOOD CULTURE, SET OF 2 ADULT BOTTLES (BACTERIA AND YEAST)    Collection Time: 07/02/23 12:52 PM    Specimen: Blood   Culture Result Status    BLOOD CULTURE, ROUTINE No Growth 3 Days Preliminary   ADULT ROUTINE BLOOD CULTURE, SET OF 2 ADULT BOTTLES (BACTERIA AND YEAST)    Collection Time: 07/02/23 12:52 PM    Specimen: Blood   Culture Result Status    BLOOD CULTURE, ROUTINE (A) Final     Staphylococcus hominis, Coagulase negative Staphylococcus    GRAM  STAIN Gram Positive Cocci/Clusters (A) Final   ADULT ROUTINE BLOOD CULTURE, SET OF 2 BOTTLES (BACTERIA AND YEAST)    Collection Time: 07/04/23 11:27 AM    Specimen: Blood   Culture Result Status    BLOOD CULTURE, ROUTINE No Growth 18-24 hrs. Preliminary       Assessment/ Plan:   Active Hospital Problems    Diagnosis    Primary Problem: Septic joint    Gram-positive cocci bacteremia    Knee swelling    Effusion of right knee joint    Hypothyroidism    PAF (paroxysmal atrial fibrillation) (CMS HCC)    NICM (nonischemic cardiomyopathy) (CMS HCC)    Chronic combined systolic and diastolic CHF, NYHA class 2 (CMS HCC)    Automatic implantable cardiac defibrillator in situ    Essential hypertension      88 years old female seen today at bedside.  Chart reviewed.  RN at bedside.  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  She was admitted to the hospital complaining of knee wound dehiscence and drainage, for no obvious reason and no reported trauma.  She has coagulase-negative Staph hominis in 1 set of blood cultures, with repeat cultures negative.  Possibility of contamination.  Empirically on daptomycin .  Lesions on the left  foot marked.  No CBC today.  Orthopedic service notes reviewed and appreciated, regarding assessment at this time.  Possible discharge planning tomorrow.  On the day of the encounter, a total of   minutes was spent on this patient encounter including review of historical information, examination, documentation and post-visit activities. The time documented excludes procedural time.  Labs,cultures,imaging,and notes reviewed.     Discussed with patient and family and Discussed with Nurse    MDM Level  Problems addressed: MODERATE (>or=1 chronic illness w exacerbation, progression, or treatment side-effects; >or= 2 stable chronic illnesses; 1 undiagnosed new problem w uncertain prognosis; 1 acute illness w systemic symptoms; 1 acute complicated injury):    Data reviewed & analyzed: MODERATE: 1 of 3 following: review of external notes, review of tests results, ordering of tests, assessment requiring an independent historian; independent interpretation of a test performed by another provider not separately reported; discussion of management or test interpretation w external provider:    Risk of morbidity: MODERATE (ex: prescription drug management, decision regarding minor surgery or elective major surgery, diagnosis or treatment limited by social determinants of health):        Berna Bacon, MD

## 2023-07-07 LAB — CBC WITH DIFF
BASOPHIL #: <0.1 x10ˆ3/uL (ref <=0.20)
BASOPHIL %: 0.8 %
EOSINOPHIL #: 0.1 x10ˆ3/uL (ref <=0.50)
EOSINOPHIL %: 2.6 %
HCT: 33.5 % — ABNORMAL LOW (ref 34.8–46.0)
HGB: 10.4 g/dL — ABNORMAL LOW (ref 11.5–16.0)
IMMATURE GRANULOCYTE #: <0.1 x10ˆ3/uL (ref <0.10)
IMMATURE GRANULOCYTE %: 0.8 % (ref 0.0–1.0)
LYMPHOCYTE #: 0.54 10*3/uL — ABNORMAL LOW (ref 1.00–4.80)
LYMPHOCYTE %: 14 %
MCH: 31 pg (ref 26.0–32.0)
MCHC: 31 g/dL (ref 31.0–35.5)
MCV: 99.7 fL (ref 78.0–100.0)
MONOCYTE #: 0.35 x10ˆ3/uL (ref 0.20–1.10)
MONOCYTE %: 9.1 %
MPV: 10.2 fL (ref 8.7–12.5)
NEUTROPHIL #: 2.8 x10ˆ3/uL (ref 1.50–7.70)
NEUTROPHIL %: 72.7 %
PLATELETS: 157 x10ˆ3/uL (ref 150–400)
RBC: 3.36 x10ˆ6/uL — ABNORMAL LOW (ref 3.85–5.22)
RDW-CV: 15.4 % (ref 11.5–15.5)
WBC: 3.9 x10ˆ3/uL (ref 3.7–11.0)

## 2023-07-07 LAB — BASIC METABOLIC PANEL
ANION GAP: 10 mmol/L (ref 7–18)
BUN/CREA RATIO: 15
BUN: 12 mg/dL (ref 8–23)
CALCIUM: 8.9 mg/dL (ref 8.3–10.7)
CHLORIDE: 106 mmol/L (ref 96–106)
CO2 TOTAL: 24 mmol/L (ref 22–30)
CREATININE: 0.79 mg/dL (ref 0.50–0.90)
ESTIMATED GFR: 73 mL/min/1.73mˆ2 — ABNORMAL LOW (ref >90)
GLUCOSE: 95 mg/dL (ref 74–109)
POTASSIUM: 3.6 mmol/L (ref 3.2–5.0)
SODIUM: 140 mmol/L (ref 133–144)

## 2023-07-07 LAB — CREATINE KINASE (CK), TOTAL, SERUM OR PLASMA: CREATINE KINASE: 61 U/L (ref <=170)

## 2023-07-07 LAB — PHOSPHORUS: PHOSPHORUS: 2.7 mg/dL (ref 2.5–4.5)

## 2023-07-07 LAB — MAGNESIUM: MAGNESIUM: 1.9 mg/dL (ref 1.6–2.4)

## 2023-07-07 NOTE — Care Plan (Signed)
 Problem: Adult Inpatient Plan of Care  Goal: Plan of Care Review  Outcome: Ongoing (see interventions/notes)  Goal: Patient-Specific Goal (Individualized)  Outcome: Ongoing (see interventions/notes)  Goal: Absence of Hospital-Acquired Illness or Injury  Outcome: Ongoing (see interventions/notes)  Goal: Optimal Comfort and Wellbeing  Outcome: Ongoing (see interventions/notes)  Goal: Rounds/Family Conference  Outcome: Ongoing (see interventions/notes)     Problem: Health Knowledge, Opportunity to Enhance (Adult,Obstetrics,Pediatric)  Goal: Knowledgeable about Health Subject/Topic  Description: Patient will demonstrate the desired outcomes by discharge/transition of care.  Outcome: Ongoing (see interventions/notes)     Problem: Wound  Goal: Optimal Coping  Outcome: Ongoing (see interventions/notes)  Goal: Optimal Functional Ability  Outcome: Ongoing (see interventions/notes)  Goal: Absence of Infection Signs and Symptoms  Outcome: Ongoing (see interventions/notes)  Goal: Improved Oral Intake  Outcome: Ongoing (see interventions/notes)  Goal: Optimal Pain Control and Function  Outcome: Ongoing (see interventions/notes)  Goal: Skin Health and Integrity  Outcome: Ongoing (see interventions/notes)  Goal: Optimal Wound Healing  Outcome: Ongoing (see interventions/notes)     Problem: Fall Injury Risk  Goal: Absence of Fall and Fall-Related Injury  Outcome: Ongoing (see interventions/notes)     Problem: Infection  Goal: Absence of Infection Signs and Symptoms  Outcome: Ongoing (see interventions/notes)

## 2023-07-07 NOTE — Progress Notes (Signed)
 [facility]  Progress Note    Ana Kennedy  Date of service: 07/07/2023   Date of Admission:  07/02/2023    Hospital Day:  LOS: 5 days   Interval history- 88 year old female with known history of hypothyroidism and osteoarthritis with knee replacement x2 last 1 years ago presented to the hospital complaining of discharge from the knee joint.  Concern for septic arthritis orthopedics consulted recommended given her age conservative management and if clinically gets worse would intervene. Infectious disease consulted recommended daptomycin  for now      Subjective:  Patient seen and examined at bedside this morning.  Resting in bed without any distress.  Denies any worsening pain or swelling.  She is alert oriented x3 but hard of hearing.  No more further drainage from the knee.  Patient has increased redness on the dorsal part of the left foot that has been resolved.  It was reported that the patient bottle from 6/25 blood culture came back positive for Gram-positive cocci in clusters    Vital Signs:  Temp  Avg: 36.8 C (98.2 F)  Min: 36.8 C (98.2 F)  Max: 36.9 C (98.4 F)    Pulse  Avg: 69.5  Min: 60  Max: 84 BP  Min: 112/53  Max: 137/65   Resp  Avg: 17.3  Min: 17  Max: 18 SpO2  Avg: 96 %  Min: 94 %  Max: 98 %          Input/Output    Intake/Output Summary (Last 24 hours) at 07/07/2023 1343  Last data filed at 07/07/2023 0737  Gross per 24 hour   Intake 424 ml   Output --   Net 424 ml    I/O last shift:  06/30 0700 - 06/30 1859  In: 118 [P.O.:118]  Out: -    DAPTOmycin  (CUBICIN ) 500 mg in NS 50 mL IVPB, 8 mg/kg, Intravenous, QPM  heparin  5,000 unit/mL injection, 5,000 Units, Subcutaneous, Q8HRS  levothyroxine  (SYNTHROID ) tablet 137 mcg, 137 mcg, Oral, QAM  melatonin tablet, 6 mg, Oral, HS PRN  metoprolol  succinate (TOPROL -XL) 24 hr extended release tablet, 12.5 mg, Oral, Daily  sacubitril -valsartan  (ENTRESTO ) 24-26 mg per tablet, 1 Tablet, Oral, 2x/day        Physical Exam:    Age-appropriate  looking elderly female , Not in acute distress   Cardiovascular: RRR, no murmurs  Respiratory: Clear to auscultation.  No wheezing  Abdomen: Soft, nontender, nondistended.  No  organomegaly  Neurologic: Alert and oriented x 3, moving all extremities.  Extremities-serous discharge from right knee wound    Labs:     Results for orders placed or performed during the hospital encounter of 07/02/23 (from the past 24 hours)   CREATINE KINASE (CK), TOTAL, SERUM WEEKLY   Result Value Ref Range    CREATINE KINASE 61 <=170 U/L   BASIC METABOLIC PANEL   Result Value Ref Range    SODIUM 140 133 - 144 mmol/L    POTASSIUM 3.6 3.2 - 5.0 mmol/L    CHLORIDE 106 96 - 106 mmol/L    CO2 TOTAL 24 22 - 30 mmol/L    ANION GAP 10 7 - 18 mmol/L    CALCIUM 8.9 8.3 - 10.7 mg/dL    GLUCOSE 95 74 - 890 mg/dL    BUN 12 8 - 23 mg/dL    CREATININE 9.20 9.49 - 0.90 mg/dL    BUN/CREA RATIO 15     ESTIMATED GFR 73 (L) >90 mL/min/1.42m^2  PHOSPHORUS   Result Value Ref Range    PHOSPHORUS 2.7 2.5 - 4.5 mg/dL   MAGNESIUM   Result Value Ref Range    MAGNESIUM 1.9 1.6 - 2.4 mg/dL   CBC WITH DIFF   Result Value Ref Range    WBC 3.9 3.7 - 11.0 x10^3/uL    RBC 3.36 (L) 3.85 - 5.22 x10^6/uL    HGB 10.4 (L) 11.5 - 16.0 g/dL    HCT 66.4 (L) 65.1 - 46.0 %    MCV 99.7 78.0 - 100.0 fL    MCH 31.0 26.0 - 32.0 pg    MCHC 31.0 31.0 - 35.5 g/dL    RDW-CV 84.5 88.4 - 84.4 %    PLATELETS 157 150 - 400 x10^3/uL    MPV 10.2 8.7 - 12.5 fL    NEUTROPHIL % 72.7 %    LYMPHOCYTE % 14.0 %    MONOCYTE % 9.1 %    EOSINOPHIL % 2.6 %    BASOPHIL % 0.8 %    NEUTROPHIL # 2.80 1.50 - 7.70 x10^3/uL    LYMPHOCYTE # 0.54 (L) 1.00 - 4.80 x10^3/uL    MONOCYTE # 0.35 0.20 - 1.10 x10^3/uL    EOSINOPHIL # 0.10 <=0.50 x10^3/uL    BASOPHIL # <0.10 <=0.20 x10^3/uL    IMMATURE GRANULOCYTE % 0.8 0.0 - 1.0 %    IMMATURE GRANULOCYTE # <0.10 <0.10 x10^3/uL        Radiology:      Results for orders placed or performed during the hospital encounter of 07/02/23 (from the past 2160 hours)   XR KNEE  RIGHT 3 VIEW     Status: None    Narrative    Evelen Gotto    XR KNEE RIGHT 3 VIEW performed on 07/02/2023 12:20 PM.    INDICATION:  swelling   Additional History:  septic joint, wound/weeping mid anterior right knee, hx of multiple right knee surgeries, sepsis and psoriatic arthritis    TECHNIQUE:  3 views/3 images of the right knee submitted for interpretation.    COMPARISON:  None available.  ___________________________________  FINDINGS:    Right knee replacement noted in anatomic alignment. Hardware intact. No lucency around the hardware. No acute fracture or dislocation. Small joint effusion. Generalized soft tissue swelling present.      Impression    Soft tissue swelling, and joint effusion.    No fracture.        Radiologist location ID: [id number]        Consults:  Orthopedics, ID  Assessment/ Plan:   Assessment & Plan  Septic joint  Right knee, Presents with increased swelling, erythematous skin changes and discharge from the right knee  -elevated CRP  ID following recommend to do IV daptomycin  daily for now   Gram-positive cocci bacteremia  Blood cultures drawn on admission 1/2 bottles positive for growth of Staphylococcus hominis coagulase-negative Staphylococcus likely contaminant.  Now it is just reported that patient another bottle also came back positive for Gram-positive cocci in clusters, awaiting the final results  ID following recommended IV daptomycin  500 mg daily for now.  I discussed management with Infectious Disease and they will decide about antibiotics by tomorrow  Effusion of right knee joint  Knee swelling  X-ray of the right knee shows joint effusion  Orthopedics consulted, discussed with patient's on operative versus non operative treatment recommended to continue antibiotics and will proceed with non operative treatment at this point.  PAF (paroxysmal atrial fibrillation) (CMS HCC)  Currently rate controlled.  Resume metoprolol  25 mg daily  Appears not on anticoagulation  Chronic  combined systolic and diastolic CHF, NYHA class 2 (CMS HCC)  Hemodynamically stable, O2 sats stable on room air.    No signs of fluid overload, BNP 555  Resume home dose Entresto  b.i.d.  NICM (nonischemic cardiomyopathy) (CMS HCC)  Automatic implantable cardiac defibrillator in situ    Essential hypertension  Resume metoprolol  and Entresto   Hypothyroidism  Resume home dose levothyroxine          DVT/PE Prophylaxis: Heparin     Payton Atlas MD [facility] (Fellow of Hospital Medicine)

## 2023-07-07 NOTE — Assessment & Plan Note (Signed)
 Currently rate controlled.    Resume metoprolol  25 mg daily  Appears not on anticoagulation

## 2023-07-07 NOTE — Assessment & Plan Note (Signed)
 Hemodynamically stable, O2 sats stable on room air.    No signs of fluid overload, BNP 555  Resume home dose Entresto  b.i.d.

## 2023-07-07 NOTE — Assessment & Plan Note (Signed)
 X-ray of the right knee shows joint effusion  Orthopedics consulted, discussed with patient's on operative versus non operative treatment recommended to continue antibiotics and will proceed with non operative treatment at this point.

## 2023-07-07 NOTE — Assessment & Plan Note (Signed)
 Resume metoprolol  and Entresto 

## 2023-07-07 NOTE — Care Plan (Signed)
 Problem: Adult Inpatient Plan of Care  Goal: Plan of Care Review  Outcome: Ongoing (see interventions/notes)  Goal: Patient-Specific Goal (Individualized)  Outcome: Ongoing (see interventions/notes)  Flowsheets (Taken 07/06/2023 2000)  Individualized Care Needs: walking better with no pain  Anxieties, Fears or Concerns: none voiced at this time  Goal: Absence of Hospital-Acquired Illness or Injury  Outcome: Ongoing (see interventions/notes)  Intervention: Identify and Manage Fall Risk  Recent Flowsheet Documentation  Taken 07/06/2023 2000 by Harlene ORN, LPN  Safety Promotion/Fall Prevention: safety round/check completed  Intervention: Prevent Skin Injury  Recent Flowsheet Documentation  Taken 07/06/2023 2000 by Harlene ORN, LPN  Body Position: supine, head elevated  Skin Protection: adhesive use limited  Intervention: Prevent and Manage VTE (Venous Thromboembolism) Risk  Recent Flowsheet Documentation  Taken 07/06/2023 2000 by Harlene ORN, LPN  VTE Prevention/Management:   ambulation promoted   anticoagulant therapy maintained  Intervention: Prevent Infection  Recent Flowsheet Documentation  Taken 07/06/2023 2000 by Harlene ORN, LPN  Infection Prevention: promote handwashing  Goal: Optimal Comfort and Wellbeing  Outcome: Ongoing (see interventions/notes)  Intervention: Provide Person-Centered Care  Recent Flowsheet Documentation  Taken 07/06/2023 2000 by Harlene ORN, LPN  Trust Relationship/Rapport: care explained  Goal: Rounds/Family Conference  Outcome: Ongoing (see interventions/notes)     Problem: Health Knowledge, Opportunity to Enhance (Adult,Obstetrics,Pediatric)  Goal: Knowledgeable about Health Subject/Topic  Description: Patient will demonstrate the desired outcomes by discharge/transition of care.  Outcome: Ongoing (see interventions/notes)  Intervention: Enhance Health Knowledge  Recent Flowsheet Documentation  Taken 07/06/2023 2000 by Harlene ORN, LPN  Supportive Measures: active listening utilized     Problem:  Wound  Goal: Optimal Coping  Outcome: Ongoing (see interventions/notes)  Intervention: Support Patient and Family Response  Recent Flowsheet Documentation  Taken 07/06/2023 2000 by Harlene ORN, LPN  Supportive Measures: active listening utilized  Goal: Optimal Functional Ability  Outcome: Ongoing (see interventions/notes)  Intervention: Optimize Functional Ability  Recent Flowsheet Documentation  Taken 07/06/2023 2000 by Harlene ORN, LPN  Activity Management: ambulated to bathroom  Activity Assistance Provided: assistance, 1 person  Assistive Device Utilized: walker  Goal: Absence of Infection Signs and Symptoms  Outcome: Ongoing (see interventions/notes)  Intervention: Prevent or Manage Infection  Recent Flowsheet Documentation  Taken 07/06/2023 2000 by Harlene ORN, LPN  Fever Reduction/Comfort Measures:   lightweight bedding   lightweight clothing   medication administered  Infection Management: aseptic technique maintained  Goal: Improved Oral Intake  Outcome: Ongoing (see interventions/notes)  Goal: Optimal Pain Control and Function  Outcome: Ongoing (see interventions/notes)  Intervention: Prevent or Manage Pain  Recent Flowsheet Documentation  Taken 07/06/2023 2000 by Harlene ORN, LPN  Sleep/Rest Enhancement: awakenings minimized  Goal: Skin Health and Integrity  Outcome: Ongoing (see interventions/notes)  Intervention: Optimize Skin Protection  Recent Flowsheet Documentation  Taken 07/06/2023 2000 by Harlene ORN, LPN  Pressure Reduction Techniques: Mobility is maximized  Pressure Reduction Devices: Repositioning wedges/pillows utilized  Activity Management: ambulated to bathroom  Head of Bed (HOB) Positioning: HOB at 20-30 degrees  Goal: Optimal Wound Healing  Outcome: Ongoing (see interventions/notes)  Intervention: Promote Wound Healing  Recent Flowsheet Documentation  Taken 07/06/2023 2000 by Harlene ORN, LPN  Pressure Reduction Techniques: Mobility is maximized  Pressure Reduction Devices: Repositioning wedges/pillows  utilized  Sleep/Rest Enhancement: awakenings minimized  Activity Management: ambulated to bathroom     Problem: Fall Injury Risk  Goal: Absence of Fall and Fall-Related Injury  Outcome: Ongoing (see interventions/notes)  Intervention: Identify and Manage Contributors  Recent Flowsheet Documentation  Taken  07/06/2023 2000 by Harlene ORN, LPN  Self-Care Promotion: independence encouraged  Medication Review/Management: medications reviewed  Intervention: Promote Injury-Free Environment  Recent Flowsheet Documentation  Taken 07/06/2023 2000 by Harlene ORN, LPN  Safety Promotion/Fall Prevention: safety round/check completed     Problem: Infection  Goal: Absence of Infection Signs and Symptoms  Outcome: Ongoing (see interventions/notes)  Intervention: Prevent or Manage Infection  Recent Flowsheet Documentation  Taken 07/06/2023 2000 by Harlene ORN, LPN  Fever Reduction/Comfort Measures:   lightweight bedding   lightweight clothing   medication administered  Infection Management: aseptic technique maintained

## 2023-07-07 NOTE — Assessment & Plan Note (Signed)
 Blood cultures drawn on admission 1/2 bottles positive for growth of Staphylococcus hominis coagulase-negative Staphylococcus likely contaminant.  Now it is just reported that patient another bottle also came back positive for Gram-positive cocci in clusters, awaiting the final results  ID following recommended IV daptomycin  500 mg daily for now.  I discussed management with Infectious Disease and they will decide about antibiotics by tomorrow

## 2023-07-07 NOTE — Care Management Notes (Signed)
 Patient's discharge plan is to return home with family support.  Patient has been accepted by [facility] home health.  Please provide home health orders.  Report can be called to [phone] prior to discharge.    Please notify CM and provide prescription if patient will require IV abx so this can be arranged.

## 2023-07-07 NOTE — Assessment & Plan Note (Signed)
 Right knee, Presents with increased swelling, erythematous skin changes and discharge from the right knee  -elevated CRP  ID following recommend to do IV daptomycin  daily for now

## 2023-07-07 NOTE — Assessment & Plan Note (Signed)
 Resume home dose levothyroxine.

## 2023-07-07 NOTE — Consults (Signed)
 Ana Kennedy  Date of service: 07/07/2023  Date of Admission:  07/02/2023  Hospital Day:  LOS: 5 days     Subjective:  No specific complaints and she is asking to go home    Review of Systems:    Review of Systems   All other systems reviewed and are negative.      Objective:      Vital Signs:  Vitals:    07/07/23 0001 07/07/23 0515 07/07/23 0706 07/07/23 1043   BP: (!) 112/53 (!) 125/58 137/65 136/89   Pulse: 68 60 64 69   Resp: 17 17 18 18    Temp: 36.8 C (98.2 F) 36.8 C (98.2 F) 36.8 C (98.2 F) 36.8 C (98.2 F)   SpO2: 94% 95% 97% 97%   Weight:       Height:       BMI:                Medications:  Antibiotics (last 24 hours)       Date/Time Action Medication Dose Rate    07/06/23 2204 New Bag/New Syringe    DAPTOmycin  (CUBICIN ) 500 mg in NS 50 mL IVPB 500 mg 132 mL/hr          DAPTOmycin  (CUBICIN ) 500 mg in NS 50 mL IVPB, 8 mg/kg, Intravenous, QPM  heparin  5,000 unit/mL injection, 5,000 Units, Subcutaneous, Q8HRS  levothyroxine  (SYNTHROID ) tablet 137 mcg, 137 mcg, Oral, QAM  melatonin tablet, 6 mg, Oral, HS PRN  metoprolol  succinate (TOPROL -XL) 24 hr extended release tablet, 12.5 mg, Oral, Daily  sacubitril -valsartan  (ENTRESTO ) 24-26 mg per tablet, 1 Tablet, Oral, 2x/day        Physical Exam:    Physical Exam  HENT:      Head: Normocephalic.      Nose: Nose normal.   Eyes:      Conjunctiva/sclera: Conjunctivae normal.   Cardiovascular:      Rate and Rhythm: Normal rate.   Pulmonary:      Effort: Pulmonary effort is normal.   Abdominal:      Palpations: Abdomen is soft.   Musculoskeletal:         General: No tenderness.      Cervical back: Neck supple.   Skin:     General: Skin is warm.      Findings: Lesion present.   Neurological:      Mental Status: She is alert. Mental status is at baseline.   Psychiatric:         Behavior: Behavior normal.          Imaging:         Labs:    Results for orders placed or performed during the hospital encounter of 07/02/23 (from the past 24 hours)   CREATINE KINASE (CK),  TOTAL, SERUM WEEKLY   Result Value Ref Range    CREATINE KINASE 61 <=170 U/L   BASIC METABOLIC PANEL   Result Value Ref Range    SODIUM 140 133 - 144 mmol/L    POTASSIUM 3.6 3.2 - 5.0 mmol/L    CHLORIDE 106 96 - 106 mmol/L    CO2 TOTAL 24 22 - 30 mmol/L    ANION GAP 10 7 - 18 mmol/L    CALCIUM 8.9 8.3 - 10.7 mg/dL    GLUCOSE 95 74 - 890 mg/dL    BUN 12 8 - 23 mg/dL    CREATININE 9.20 9.49 - 0.90 mg/dL    BUN/CREA RATIO 15     ESTIMATED GFR  73 (L) >90 mL/min/1.20m^2   PHOSPHORUS   Result Value Ref Range    PHOSPHORUS 2.7 2.5 - 4.5 mg/dL   MAGNESIUM   Result Value Ref Range    MAGNESIUM 1.9 1.6 - 2.4 mg/dL   CBC WITH DIFF   Result Value Ref Range    WBC 3.9 3.7 - 11.0 x10^3/uL    RBC 3.36 (L) 3.85 - 5.22 x10^6/uL    HGB 10.4 (L) 11.5 - 16.0 g/dL    HCT 66.4 (L) 65.1 - 46.0 %    MCV 99.7 78.0 - 100.0 fL    MCH 31.0 26.0 - 32.0 pg    MCHC 31.0 31.0 - 35.5 g/dL    RDW-CV 84.5 88.4 - 84.4 %    PLATELETS 157 150 - 400 x10^3/uL    MPV 10.2 8.7 - 12.5 fL    NEUTROPHIL % 72.7 %    LYMPHOCYTE % 14.0 %    MONOCYTE % 9.1 %    EOSINOPHIL % 2.6 %    BASOPHIL % 0.8 %    NEUTROPHIL # 2.80 1.50 - 7.70 x10^3/uL    LYMPHOCYTE # 0.54 (L) 1.00 - 4.80 x10^3/uL    MONOCYTE # 0.35 0.20 - 1.10 x10^3/uL    EOSINOPHIL # 0.10 <=0.50 x10^3/uL    BASOPHIL # <0.10 <=0.20 x10^3/uL    IMMATURE GRANULOCYTE % 0.8 0.0 - 1.0 %    IMMATURE GRANULOCYTE # <0.10 <0.10 x10^3/uL       Microbiology:  Hospital Encounter on 07/02/23 (from the past 96 hours)   ADULT ROUTINE BLOOD CULTURE, SET OF 2 BOTTLES (BACTERIA AND YEAST)    Collection Time: 07/04/23 11:27 AM    Specimen: Blood   Culture Result Status    BLOOD CULTURE, ROUTINE No Growth 3 Days Preliminary       Assessment/ Plan:   Active Hospital Problems    Diagnosis    Primary Problem: Septic joint    Gram-positive cocci bacteremia    Knee swelling    Effusion of right knee joint    Hypothyroidism    PAF (paroxysmal atrial fibrillation) (CMS HCC)    NICM (nonischemic cardiomyopathy) (CMS HCC)    Chronic  combined systolic and diastolic CHF, NYHA class 2 (CMS HCC)    Automatic implantable cardiac defibrillator in situ    Essential hypertension      88 years old lady seen this morning at bedside and chart reviewed and discussed with the patient.  She had no specific complaints and stated that her knees feeling better.  On exam erythema is less pronounced and dehisced area not draining today.  Patient has history of chronic staphylococcal infection of knee replacement and she was on chronic suppressive antibiotics and came to the hospital with some kind of breakthrough, with a complaint of knee redness and dehiscence.  Patient was placed on daptomycin  pending final results of blood cultures.  At 1st patient had coagulase-negative Staph hominis in 1 set of blood cultures, with a possibility of contamination being entertained but daptomycin  was still maintained through the weekend.  However the blood culture from 6/25 had another set add on with Gram-positive cocci.  This was explained to the patient.  And to follow-up on final identification until we determine plan for discharge.  Blood cultures from June 27th are preliminary negative.  If needed patient is not objecting to IV antibiotics.  Orthopedic service has also evaluated the patient and appreciate last note, patient to follow in orthopedic office upon discharge as well as  Infectious Disease office .  Hopefully discharge planning tomorrow, following on additional Gram-positive from admission blood culture, maintained on daptomycin  500 mg daily for now.  On the day of the encounter, a total of   minutes was spent on this patient encounter including review of historical information, examination, documentation and post-visit activities. The time documented excludes procedural time.  Labs,cultures,imaging,and notes reviewed.     Discussed with patient and family and Discussed with Infectious disease Pharmacists    MDM Level  Problems addressed: MODERATE (>or=1 chronic  illness w exacerbation, progression, or treatment side-effects; >or= 2 stable chronic illnesses; 1 undiagnosed new problem w uncertain prognosis; 1 acute illness w systemic symptoms; 1 acute complicated injury):    Data reviewed & analyzed: MODERATE: 1 of 3 following: review of external notes, review of tests results, ordering of tests, assessment requiring an independent historian; independent interpretation of a test performed by another provider not separately reported; discussion of management or test interpretation w external provider:    Risk of morbidity: HIGH (ex: drug therapy requiring intensive monitoring, decision regarding major surgery with risk factors, decision regarding hospitalization or escalation of hospital level of care): IV antimicrobials which require intensive monitoring or high risk oral antimicrobial therapy which requires intensive monitoring      Berna Bacon, MD

## 2023-07-08 LAB — BASIC METABOLIC PANEL
ANION GAP: 9 mmol/L (ref 7–18)
BUN/CREA RATIO: 19
BUN: 19 mg/dL (ref 8–23)
CALCIUM: 8.9 mg/dL (ref 8.3–10.7)
CHLORIDE: 106 mmol/L (ref 96–106)
CO2 TOTAL: 26 mmol/L (ref 22–30)
CREATININE: 1 mg/dL — ABNORMAL HIGH (ref 0.50–0.90)
ESTIMATED GFR: 55 mL/min/{1.73_m2} — ABNORMAL LOW (ref >90)
GLUCOSE: 91 mg/dL (ref 74–109)
POTASSIUM: 4 mmol/L (ref 3.2–5.0)
SODIUM: 141 mmol/L (ref 133–144)

## 2023-07-08 LAB — CBC WITH DIFF
BASOPHIL #: <0.1 10*3/uL (ref <=0.20)
BASOPHIL %: 0.7 %
EOSINOPHIL #: 0.13 10*3/uL (ref <=0.50)
EOSINOPHIL %: 3 %
HCT: 32.5 % — ABNORMAL LOW (ref 34.8–46.0)
HGB: 10.1 g/dL — ABNORMAL LOW (ref 11.5–16.0)
IMMATURE GRANULOCYTE #: <0.1 10*3/uL (ref <0.10)
IMMATURE GRANULOCYTE %: 0.7 % (ref 0.0–1.0)
LYMPHOCYTE #: 0.73 10*3/uL — ABNORMAL LOW (ref 1.00–4.80)
LYMPHOCYTE %: 17.1 %
MCH: 31.1 pg (ref 26.0–32.0)
MCHC: 31.1 g/dL (ref 31.0–35.5)
MCV: 100 fL (ref 78.0–100.0)
MONOCYTE #: 0.4 10*3/uL (ref 0.20–1.10)
MONOCYTE %: 9.4 %
MPV: 10.2 fL (ref 8.7–12.5)
NEUTROPHIL #: 2.95 10*3/uL (ref 1.50–7.70)
NEUTROPHIL %: 69.1 %
PLATELETS: 164 10*3/uL (ref 150–400)
RBC: 3.25 10*6/uL — ABNORMAL LOW (ref 3.85–5.22)
RDW-CV: 15.7 % — ABNORMAL HIGH (ref 11.5–15.5)
WBC: 4.3 10*3/uL (ref 3.7–11.0)

## 2023-07-08 LAB — ADULT ROUTINE BLOOD CULTURE, SET OF 2 BOTTLES (BACTERIA AND YEAST): BLOOD CULTURE, ROUTINE: NO GROWTH — AB

## 2023-07-08 MED ORDER — SODIUM CHLORIDE 0.9 % INTRAVENOUS PIGGYBACK
500.0000 mg | INTRAVENOUS | 1 refills | Status: AC
Start: 2023-07-08 — End: 2023-07-22

## 2023-07-08 MED ORDER — SODIUM CHLORIDE 0.9 % (FLUSH) INJECTION SYRINGE
10.0000 mL | INJECTION | Freq: Three times a day (TID) | INTRAMUSCULAR | Status: DC
Start: 2023-07-08 — End: 2023-07-09
  Administered 2023-07-08: 10 mL via INTRAVENOUS
  Administered 2023-07-08: 0 mL via INTRAVENOUS
  Administered 2023-07-09: 10 mL via INTRAVENOUS
  Administered 2023-07-09: 0 mL via INTRAVENOUS

## 2023-07-08 MED ORDER — SODIUM CHLORIDE 0.9 % (FLUSH) INJECTION SYRINGE
20.0000 mL | INJECTION | INTRAMUSCULAR | Status: DC | PRN
Start: 2023-07-08 — End: 2023-07-09

## 2023-07-08 NOTE — Nurses Notes (Signed)
 Awaiting consent for PICC line placement from [facility]. Nursing has attempted to contact [facility] but has been unable to reach at this time. PICC placement will be deferred to AM if consent is not obtain today.

## 2023-07-08 NOTE — Assessment & Plan Note (Signed)
 Blood cultures drawn on admission 1/2 bottles positive for growth of Staphylococcus hominis coagulase-negative Staphylococcus likely contaminant.  Now it is just reported that patient another bottle also came back positive for Gram-positive cocci in clusters, awaiting the final results  ID following recommended IV daptomycin  500 mg daily for now.  I discussed management with Infectious Disease and they will decide about antibiotics by tomorrow

## 2023-07-08 NOTE — Nurses Notes (Signed)
 Chart reviewed for PICC placement. Maryann Castilla, RN to obtain PICC cart and consent. RN to contact [facility] regarding consent and will notify PICC team when ready.

## 2023-07-08 NOTE — Progress Notes (Signed)
 [facility]  Progress Note    Ana Kennedy  Date of service: 07/08/2023   Date of Admission:  07/02/2023    Hospital Day:  LOS: 6 days   Interval history- 88 year old female with known history of hypothyroidism and osteoarthritis with knee replacement x2 last 1 years ago presented to the hospital complaining of discharge from the knee joint.  Concern for septic arthritis orthopedics consulted recommended given her age conservative management and if clinically gets worse would intervene. Infectious disease consulted recommended daptomycin  for now      Subjective:  Patient seen and examined at bedside this morning.  Resting in bed without any distress.  Denies any worsening pain or swelling.  She is alert oriented x3 but hard of hearing.  No more further drainage from the knee.  Patient has increased redness on the dorsal part of the left foot that has been resolved.  It was reported that the patient bottle from 6/25 blood culture came back positive for Gram-positive cocci in clusters    Vital Signs:  Temp  Avg: 36.5 C (97.7 F)  Min: 36.1 C (97 F)  Max: 36.9 C (98.4 F)    Pulse  Avg: 65.3  Min: 61  Max: 72 BP  Min: 128/52  Max: 152/80   Resp  Avg: 18.5  Min: 18  Max: 19 SpO2  Avg: 95.5 %  Min: 94 %  Max: 98 %          Input/Output    Intake/Output Summary (Last 24 hours) at 07/08/2023 1240  Last data filed at 07/08/2023 0900  Gross per 24 hour   Intake 786 ml   Output --   Net 786 ml    I/O last shift:  07/01 0700 - 07/01 1859  In: 240 [P.O.:240]  Out: -    DAPTOmycin  (CUBICIN ) 500 mg in NS 50 mL IVPB, 8 mg/kg, Intravenous, QPM  heparin  5,000 unit/mL injection, 5,000 Units, Subcutaneous, Q8HRS  levothyroxine  (SYNTHROID ) tablet 137 mcg, 137 mcg, Oral, QAM  melatonin tablet, 6 mg, Oral, HS PRN  metoprolol  succinate (TOPROL -XL) 24 hr extended release tablet, 12.5 mg, Oral, Daily  sacubitril -valsartan  (ENTRESTO ) 24-26 mg per tablet, 1 Tablet, Oral, 2x/day        Physical Exam:    Age-appropriate  looking elderly female , Not in acute distress   Cardiovascular: RRR, no murmurs  Respiratory: Clear to auscultation.  No wheezing  Abdomen: Soft, nontender, nondistended.  No  organomegaly  Neurologic: Alert and oriented x 3, moving all extremities.  Extremities-serous discharge from right knee wound    Labs:     Results for orders placed or performed during the hospital encounter of 07/02/23 (from the past 24 hours)   BASIC METABOLIC PANEL   Result Value Ref Range    SODIUM 141 133 - 144 mmol/L    POTASSIUM 4.0 3.2 - 5.0 mmol/L    CHLORIDE 106 96 - 106 mmol/L    CO2 TOTAL 26 22 - 30 mmol/L    ANION GAP 9 7 - 18 mmol/L    CALCIUM 8.9 8.3 - 10.7 mg/dL    GLUCOSE 91 74 - 890 mg/dL    BUN 19 8 - 23 mg/dL    CREATININE 8.99 (H) 0.50 - 0.90 mg/dL    BUN/CREA RATIO 19     ESTIMATED GFR 55 (L) >90 mL/min/1.43m^2   CBC WITH DIFF   Result Value Ref Range    WBC 4.3 3.7 - 11.0 x10^3/uL  RBC 3.25 (L) 3.85 - 5.22 x10^6/uL    HGB 10.1 (L) 11.5 - 16.0 g/dL    HCT 67.4 (L) 65.1 - 46.0 %    MCV 100.0 78.0 - 100.0 fL    MCH 31.1 26.0 - 32.0 pg    MCHC 31.1 31.0 - 35.5 g/dL    RDW-CV 84.2 (H) 88.4 - 15.5 %    PLATELETS 164 150 - 400 x10^3/uL    MPV 10.2 8.7 - 12.5 fL    NEUTROPHIL % 69.1 %    LYMPHOCYTE % 17.1 %    MONOCYTE % 9.4 %    EOSINOPHIL % 3.0 %    BASOPHIL % 0.7 %    NEUTROPHIL # 2.95 1.50 - 7.70 x10^3/uL    LYMPHOCYTE # 0.73 (L) 1.00 - 4.80 x10^3/uL    MONOCYTE # 0.40 0.20 - 1.10 x10^3/uL    EOSINOPHIL # 0.13 <=0.50 x10^3/uL    BASOPHIL # <0.10 <=0.20 x10^3/uL    IMMATURE GRANULOCYTE % 0.7 0.0 - 1.0 %    IMMATURE GRANULOCYTE # <0.10 <0.10 x10^3/uL        Radiology:      Results for orders placed or performed during the hospital encounter of 07/02/23 (from the past 2160 hours)   XR KNEE RIGHT 3 VIEW     Status: None    Narrative    Ana Kennedy    XR KNEE RIGHT 3 VIEW performed on 07/02/2023 12:20 PM.    INDICATION:  swelling   Additional History:  septic joint, wound/weeping mid anterior right knee, hx of multiple right  knee surgeries, sepsis and psoriatic arthritis    TECHNIQUE:  3 views/3 images of the right knee submitted for interpretation.    COMPARISON:  None available.  ___________________________________  FINDINGS:    Right knee replacement noted in anatomic alignment. Hardware intact. No lucency around the hardware. No acute fracture or dislocation. Small joint effusion. Generalized soft tissue swelling present.      Impression    Soft tissue swelling, and joint effusion.    No fracture.        Radiologist location ID: [id number]        Consults:  Orthopedics, ID  Assessment/ Plan:   Assessment & Plan  Septic joint  Right knee, Presents with increased swelling, erythematous skin changes and discharge from the right knee  -elevated CRP  ID following recommend to do IV daptomycin  daily for now   Gram-positive cocci bacteremia  Blood cultures drawn on admission 1/2 bottles positive for growth of Staphylococcus hominis coagulase-negative Staphylococcus likely contaminant.  Now it is just reported that patient another bottle also came back positive for Gram-positive cocci in clusters, awaiting the final results  ID following recommended IV daptomycin  500 mg daily for now.  I discussed management with Infectious Disease and they will decide about antibiotics by tomorrow  Effusion of right knee joint  Knee swelling  X-ray of the right knee shows joint effusion  Orthopedics consulted, discussed with patient's on operative versus non operative treatment recommended to continue antibiotics and will proceed with non operative treatment at this point.  PAF (paroxysmal atrial fibrillation) (CMS HCC)  Currently rate controlled.    Resume metoprolol  25 mg daily  Appears not on anticoagulation  Chronic combined systolic and diastolic CHF, NYHA class 2 (CMS HCC)  Hemodynamically stable, O2 sats stable on room air.    No signs of fluid overload, BNP 555  Resume home dose Entresto  b.i.d.  NICM (nonischemic cardiomyopathy) (CMS  HCC)  Automatic implantable cardiac defibrillator in situ    Essential hypertension  Resume metoprolol  and Entresto   Hypothyroidism  Resume home dose levothyroxine          DVT/PE Prophylaxis: Heparin     Payton Atlas MD [facility] (Fellow of Hospital Medicine)

## 2023-07-08 NOTE — Care Management Notes (Signed)
 Prescription for dapto provided by ID physician.  Prescription sent to [facility] for insurance auth to be submitted.  Patient will require PICC insertion.      Patient has been accepted by [facility] home health.  Please provide home health orders.  Report can be called to [phone] prior to discharge.

## 2023-07-08 NOTE — Assessment & Plan Note (Signed)
 Resume home dose levothyroxine.

## 2023-07-08 NOTE — Care Plan (Signed)
 Problem: Adult Inpatient Plan of Care  Goal: Plan of Care Review  Outcome: Ongoing (see interventions/notes)  Goal: Patient-Specific Goal (Individualized)  Outcome: Ongoing (see interventions/notes)  Goal: Absence of Hospital-Acquired Illness or Injury  Outcome: Ongoing (see interventions/notes)  Intervention: Identify and Manage Fall Risk  Recent Flowsheet Documentation  Taken 07/08/2023 1600 by Silvano BRAVO, RN  Safety Promotion/Fall Prevention:   safety round/check completed   nonskid shoes/slippers when out of bed   fall prevention program maintained   activity supervised  Taken 07/08/2023 1100 by Silvano BRAVO, RN  Safety Promotion/Fall Prevention:   safety round/check completed   fall prevention program maintained   activity supervised   nonskid shoes/slippers when out of bed  Taken 07/08/2023 0750 by Silvano BRAVO, RN  Safety Promotion/Fall Prevention:   safety round/check completed   nonskid shoes/slippers when out of bed   fall prevention program maintained   activity supervised  Intervention: Prevent Skin Injury  Recent Flowsheet Documentation  Taken 07/08/2023 0750 by Silvano BRAVO, RN  Body Position: sitting  Skin Protection: adhesive use limited  Intervention: Prevent and Manage VTE (Venous Thromboembolism) Risk  Recent Flowsheet Documentation  Taken 07/08/2023 1600 by Silvano BRAVO, RN  VTE Prevention/Management: ambulation promoted  Taken 07/08/2023 0750 by Silvano BRAVO, RN  VTE Prevention/Management: ambulation promoted  Intervention: Prevent Infection  Recent Flowsheet Documentation  Taken 07/08/2023 0750 by Silvano BRAVO, RN  Infection Prevention: promote handwashing  Goal: Optimal Comfort and Wellbeing  Outcome: Ongoing (see interventions/notes)  Intervention: Provide Person-Centered Care  Recent Flowsheet Documentation  Taken 07/08/2023 1600 by Silvano BRAVO, RN  Trust Relationship/Rapport: care explained  Taken 07/08/2023 0750 by Silvano BRAVO, RN  Trust Relationship/Rapport: care explained  Goal: Rounds/Family Conference  Outcome: Ongoing (see  interventions/notes)     Problem: Health Knowledge, Opportunity to Enhance (Adult,Obstetrics,Pediatric)  Goal: Knowledgeable about Health Subject/Topic  Description: Patient will demonstrate the desired outcomes by discharge/transition of care.  Outcome: Ongoing (see interventions/notes)  Intervention: Enhance Health Knowledge  Recent Flowsheet Documentation  Taken 07/08/2023 0750 by Silvano BRAVO, RN  Supportive Measures: active listening utilized     Problem: Wound  Goal: Optimal Coping  Outcome: Ongoing (see interventions/notes)  Intervention: Support Patient and Family Response  Recent Flowsheet Documentation  Taken 07/08/2023 0750 by Silvano BRAVO, RN  Supportive Measures: active listening utilized  Goal: Optimal Functional Ability  Outcome: Ongoing (see interventions/notes)  Intervention: Optimize Functional Ability  Recent Flowsheet Documentation  Taken 07/08/2023 1600 by Silvano BRAVO, RN  Activity Management: ambulated in room  Activity Assistance Provided: assistance, 1 person  Taken 07/08/2023 0750 by Silvano BRAVO, RN  Activity Management: ambulated in room  Activity Assistance Provided: assistance, 1 person  Assistive Device Utilized: walker  Goal: Absence of Infection Signs and Symptoms  Outcome: Ongoing (see interventions/notes)  Goal: Improved Oral Intake  Outcome: Ongoing (see interventions/notes)  Goal: Optimal Pain Control and Function  Outcome: Ongoing (see interventions/notes)  Goal: Skin Health and Integrity  Outcome: Ongoing (see interventions/notes)  Intervention: Optimize Skin Protection  Recent Flowsheet Documentation  Taken 07/08/2023 1600 by Silvano BRAVO, RN  Pressure Reduction Techniques:   Frequent weight shifting encouraged   Moisture, shear and nutrition are maximized   Mobility is maximized  Pressure Reduction Devices: Repositioning wedges/pillows utilized  Activity Management: ambulated in room  Taken 07/08/2023 0750 by Silvano BRAVO, RN  Pressure Reduction Techniques:   Frequent weight shifting encouraged   Moisture, shear and  nutrition are maximized   Mobility is maximized  Pressure Reduction Devices: Repositioning wedges/pillows utilized  Activity Management: ambulated in room  Head of Bed [facility]) Positioning: HOB elevated  Goal: Optimal Wound Healing  Outcome: Ongoing (see interventions/notes)  Intervention: Promote Wound Healing  Recent Flowsheet Documentation  Taken 07/08/2023 1600 by Silvano BRAVO, RN  Pressure Reduction Techniques:   Frequent weight shifting encouraged   Moisture, shear and nutrition are maximized   Mobility is maximized  Pressure Reduction Devices: Repositioning wedges/pillows utilized  Activity Management: ambulated in room  Taken 07/08/2023 0750 by Silvano BRAVO, RN  Pressure Reduction Techniques:   Frequent weight shifting encouraged   Moisture, shear and nutrition are maximized   Mobility is maximized  Pressure Reduction Devices: Repositioning wedges/pillows utilized  Activity Management: ambulated in room     Problem: Fall Injury Risk  Goal: Absence of Fall and Fall-Related Injury  Outcome: Ongoing (see interventions/notes)  Intervention: Identify and Manage Contributors  Recent Flowsheet Documentation  Taken 07/08/2023 0750 by Silvano BRAVO, RN  Self-Care Promotion: independence encouraged  Medication Review/Management: medications reviewed  Intervention: Promote Injury-Free Environment  Recent Flowsheet Documentation  Taken 07/08/2023 1600 by Silvano BRAVO, RN  Safety Promotion/Fall Prevention:   safety round/check completed   nonskid shoes/slippers when out of bed   fall prevention program maintained   activity supervised  Taken 07/08/2023 1100 by Silvano BRAVO, RN  Safety Promotion/Fall Prevention:   safety round/check completed   fall prevention program maintained   activity supervised   nonskid shoes/slippers when out of bed  Taken 07/08/2023 0750 by Silvano BRAVO, RN  Safety Promotion/Fall Prevention:   safety round/check completed   nonskid shoes/slippers when out of bed   fall prevention program maintained   activity supervised     Problem:  Infection  Goal: Absence of Infection Signs and Symptoms  Outcome: Ongoing (see interventions/notes)

## 2023-07-08 NOTE — Assessment & Plan Note (Signed)
 Hemodynamically stable, O2 sats stable on room air.    No signs of fluid overload, BNP 555  Resume home dose Entresto  b.i.d.

## 2023-07-08 NOTE — Assessment & Plan Note (Signed)
 Currently rate controlled.    Resume metoprolol  25 mg daily  Appears not on anticoagulation

## 2023-07-08 NOTE — Assessment & Plan Note (Signed)
 Right knee, Presents with increased swelling, erythematous skin changes and discharge from the right knee  -elevated CRP  ID following recommend to do IV daptomycin  daily for now

## 2023-07-08 NOTE — Assessment & Plan Note (Signed)
 X-ray of the right knee shows joint effusion  Orthopedics consulted, discussed with patient's on operative versus non operative treatment recommended to continue antibiotics and will proceed with non operative treatment at this point.

## 2023-07-08 NOTE — Consults (Signed)
 Ana Kennedy  Date of service: 07/08/2023  Date of Admission:  07/02/2023  Hospital Day:  LOS: 6 days     Subjective:  Seen today no specific complaints but eager to go    Review of Systems:    Review of Systems   All other systems reviewed and are negative.      Objective:      Vital Signs:  Vitals:    07/07/23 1930 07/08/23 0019 07/08/23 0425 07/08/23 0711   BP: (!) 142/56 128/79 (!) 128/52 (!) 152/80   Pulse: 63 65 61 72   Resp: 19 18 19 18    Temp: 36.6 C (97.9 F) 36.1 C (97 F) 36.4 C (97.5 F) 36.9 C (98.4 F)   SpO2: 95% 98% 95% 94%   Weight:       Height:       BMI:                Medications:  Antibiotics (last 24 hours)       Date/Time Action Medication Dose Rate    07/07/23 2146 New Bag/New Syringe    DAPTOmycin  (CUBICIN ) 500 mg in NS 50 mL IVPB 500 mg 132 mL/hr          DAPTOmycin  (CUBICIN ) 500 mg in NS 50 mL IVPB, 8 mg/kg, Intravenous, QPM  heparin  5,000 unit/mL injection, 5,000 Units, Subcutaneous, Q8HRS  levothyroxine  (SYNTHROID ) tablet 137 mcg, 137 mcg, Oral, QAM  melatonin tablet, 6 mg, Oral, HS PRN  metoprolol  succinate (TOPROL -XL) 24 hr extended release tablet, 12.5 mg, Oral, Daily  NS flush syringe, 10 mL, Intravenous, Q8HRS  NS flush syringe, 20 mL, Intravenous, Q1 MIN PRN  sacubitril -valsartan  (ENTRESTO ) 24-26 mg per tablet, 1 Tablet, Oral, 2x/day        Physical Exam:    Physical Exam  Constitutional:       Appearance: Normal appearance.   HENT:      Head: Normocephalic and atraumatic.      Nose: Nose normal.   Eyes:      Conjunctiva/sclera: Conjunctivae normal.   Cardiovascular:      Rate and Rhythm: Normal rate.   Pulmonary:      Effort: Pulmonary effort is normal.   Abdominal:      Palpations: Abdomen is soft.   Musculoskeletal:         General: No tenderness.      Cervical back: Neck supple.   Skin:     General: Skin is warm.      Findings: Lesion present.   Neurological:      Mental Status: She is alert. Mental status is at baseline.   Psychiatric:         Behavior: Behavior normal.           Imaging:         Labs:    Results for orders placed or performed during the hospital encounter of 07/02/23 (from the past 24 hours)   BASIC METABOLIC PANEL   Result Value Ref Range    SODIUM 141 133 - 144 mmol/L    POTASSIUM 4.0 3.2 - 5.0 mmol/L    CHLORIDE 106 96 - 106 mmol/L    CO2 TOTAL 26 22 - 30 mmol/L    ANION GAP 9 7 - 18 mmol/L    CALCIUM 8.9 8.3 - 10.7 mg/dL    GLUCOSE 91 74 - 890 mg/dL    BUN 19 8 - 23 mg/dL    CREATININE 8.99 (H) 0.50 - 0.90 mg/dL  BUN/CREA RATIO 19     ESTIMATED GFR 55 (L) >90 mL/min/1.75m^2   CBC WITH DIFF   Result Value Ref Range    WBC 4.3 3.7 - 11.0 x10^3/uL    RBC 3.25 (L) 3.85 - 5.22 x10^6/uL    HGB 10.1 (L) 11.5 - 16.0 g/dL    HCT 67.4 (L) 65.1 - 46.0 %    MCV 100.0 78.0 - 100.0 fL    MCH 31.1 26.0 - 32.0 pg    MCHC 31.1 31.0 - 35.5 g/dL    RDW-CV 84.2 (H) 88.4 - 15.5 %    PLATELETS 164 150 - 400 x10^3/uL    MPV 10.2 8.7 - 12.5 fL    NEUTROPHIL % 69.1 %    LYMPHOCYTE % 17.1 %    MONOCYTE % 9.4 %    EOSINOPHIL % 3.0 %    BASOPHIL % 0.7 %    NEUTROPHIL # 2.95 1.50 - 7.70 x10^3/uL    LYMPHOCYTE # 0.73 (L) 1.00 - 4.80 x10^3/uL    MONOCYTE # 0.40 0.20 - 1.10 x10^3/uL    EOSINOPHIL # 0.13 <=0.50 x10^3/uL    BASOPHIL # <0.10 <=0.20 x10^3/uL    IMMATURE GRANULOCYTE % 0.7 0.0 - 1.0 %    IMMATURE GRANULOCYTE # <0.10 <0.10 x10^3/uL       Microbiology:  No results found for any visits on 07/02/23 (from the past 96 hours).    Assessment/ Plan:   Active Hospital Problems    Diagnosis    Primary Problem: Septic joint    Gram-positive cocci bacteremia    Knee swelling    Effusion of right knee joint    Hypothyroidism    PAF (paroxysmal atrial fibrillation) (CMS HCC)    NICM (nonischemic cardiomyopathy) (CMS HCC)    Chronic combined systolic and diastolic CHF, NYHA class 2 (CMS HCC)    Automatic implantable cardiac defibrillator in situ    Essential hypertension      88 years old lady seen today at bedside, discussed with patient, no specific complaints but would like to go home, okay  with IV.  Patient has history of chronic knee arthroplasty infection and history of recurrent staphylococcal infections and history of suppressive oral antibiotics, and was admitted to the hospital with breakthrough, redness and some opening of the knee wound, and Gram-positive cocci in the blood,  With 2 versions of coagulase-negative staph in blood cultures, staph hominis and staph pettenkoferi.  Possibility of contaminant entertained, given different sub species, however patient presented with breakthrough inflammation of the knee, on suppressive antibiotics, with improvement on daptomycin .  Patient is currently on daptomycin  500 mg daily, and discharge plan will be on daptomycin , script provided for 2 weeks with 1 refill.  Weekly labs CBC CMP CPK ESR and BMP to be faxed to ID office.  called patient's daughter Ana Kennedy  today, to give her update, and to go over antibiotics for discharge plan.  Updated her on the results and discharge planning on daptomycin .  Answered questions.  Agrees with the plan.  Advised follow-up with Infectious Disease in the office and also to follow with orthopedic clinic.  On the day of the encounter, a total of   minutes was spent on this patient encounter including review of historical information, examination, documentation and post-visit activities. The time documented excludes procedural time.  Labs,cultures,imaging,and notes reviewed.     Discussed with patient and family and Discussed with Care Team    MDM Level  Problems addressed: MODERATE (>or=1 chronic illness  w exacerbation, progression, or treatment side-effects; >or= 2 stable chronic illnesses; 1 undiagnosed new problem w uncertain prognosis; 1 acute illness w systemic symptoms; 1 acute complicated injury):    Data reviewed & analyzed: MODERATE: 1 of 3 following: review of external notes, review of tests results, ordering of tests, assessment requiring an independent historian; independent interpretation of a test performed  by another provider not separately reported; discussion of management or test interpretation w external provider:    Risk of morbidity: HIGH (ex: drug therapy requiring intensive monitoring, decision regarding major surgery with risk factors, decision regarding hospitalization or escalation of hospital level of care): IV antimicrobials which require intensive monitoring or high risk oral antimicrobial therapy which requires intensive monitoring      Berna Bacon, MD

## 2023-07-08 NOTE — Assessment & Plan Note (Signed)
 Resume metoprolol  and Entresto 

## 2023-07-09 ENCOUNTER — Inpatient Hospital Stay (HOSPITAL_COMMUNITY)

## 2023-07-09 DIAGNOSIS — M25469 Effusion, unspecified knee: Secondary | ICD-10-CM

## 2023-07-09 DIAGNOSIS — Z950 Presence of cardiac pacemaker: Secondary | ICD-10-CM

## 2023-07-09 DIAGNOSIS — Z452 Encounter for adjustment and management of vascular access device: Secondary | ICD-10-CM

## 2023-07-09 LAB — ADULT ROUTINE BLOOD CULTURE, SET OF 2 BOTTLES (BACTERIA AND YEAST): BLOOD CULTURE, ROUTINE: NO GROWTH

## 2023-07-09 NOTE — Assessment & Plan Note (Signed)
 X-ray of the right knee shows joint effusion  Orthopedics consulted, discussed with patient's on operative versus non operative treatment recommended to continue antibiotics and will proceed with non operative treatment at this point.

## 2023-07-09 NOTE — Care Management Notes (Signed)
 IV Dapto has been approved through [facility].  Patient is awaiting PICC insertion.  [facility] to complete education later this afternoon with patient and family.    Patient has been accepted by [facility] home health. Please provide home health orders. Report can be called to [phone] prior to discharge.

## 2023-07-09 NOTE — Assessment & Plan Note (Signed)
 Currently rate controlled.    Resume metoprolol  25 mg daily  Appears not on anticoagulation

## 2023-07-09 NOTE — Assessment & Plan Note (Signed)
 Right knee, Presents with increased swelling, erythematous skin changes and discharge from the right knee  -elevated CRP  ID following recommend to do IV daptomycin  daily for now

## 2023-07-09 NOTE — Discharge Summary (Signed)
 [facility]  DISCHARGE SUMMARY    PATIENT NAME:  Ana Kennedy, Ana Kennedy  MRN:  [id number]  DOB:  11/19/1935    ENCOUNTER DATE:  07/02/2023  INPATIENT ADMISSION DATE: 07/02/2023  DISCHARGE DATE:  07/09/2023    ATTENDING PHYSICIAN: Earley Booty, MD  SERVICE: [facility] HOSPITALIST 3  PRIMARY CARE PHYSICIAN: [name], MD       No lay caregiver identified.    PRIMARY DISCHARGE DIAGNOSIS: Septic joint  Active Hospital Problems    Diagnosis Date Noted    Principal Problem: Septic joint [M00.9] 07/02/2023    Knee swelling [M25.469] 07/02/2023    Effusion of right knee joint [M25.461] 07/02/2023    Hypothyroidism [E03.9]     PAF (paroxysmal atrial fibrillation) (CMS HCC) [I48.0] 12/21/2021    NICM (nonischemic cardiomyopathy) (CMS HCC) [I42.8] 12/21/2021    Chronic combined systolic and diastolic CHF, NYHA class 2 (CMS HCC) [I50.42] 12/21/2021    Automatic implantable cardiac defibrillator in situ [Z95.810] 12/21/2021    Essential hypertension [I10] 12/21/2021      Resolved Hospital Problems    Diagnosis     Gram-positive cocci bacteremia [R78.81]      Active Non-Hospital Problems    Diagnosis Date Noted    Lumbago 03/10/2023    Sjogren syndrome 03/10/2023    Mild cognitive disorder     DOE (dyspnea on exertion) 12/21/2021    Ventricular tachycardia (paroxysmal) 12/21/2021             Current Discharge Medication List        START taking these medications.        Details   DAPTOmycin  500 mg in NS 50 mL infusion   500 mg, Intravenous, EVERY 24 HOURS, Mix and infuse per policy of Home [facility]fusion Pharm[facility].  Qty: 7 Each  Refills: 1            CONTINUE these medications - NO CHANGES were made during your visit.        Details   Entresto  24-26 mg Tablet  Generic drug: sacubitriL -valsartan    1 Tablet, Oral, 2 TIMES DAILY  Qty: 180 Tablet  Refills: 1     levothyroxine  137 mcg Tablet  Commonly known as: SYNTHROID    137 mcg, Oral, EVERY MORNING  Qty: 90 Tablet  Refills: 1     metoprolol  succinate 25 mg Tablet Sustained Release  24 hr  Commonly known as: TOPROL -XL   12.5 mg, Oral, Daily  Qty: 45 Tablet  Refills: 4            STOP taking these medications.      cephalexin  500 mg Capsule  Commonly known as: KEFLEX      cyclobenzaprine  5 mg Tablet  Commonly known as: FLEXERIL      rifAMPin  300 mg Capsule  Commonly known as: RIFADIN             Discharge med list refreshed?  YES     Allergies[1]  HOSPITAL PROCEDURE(S):   No orders of the defined types were placed in this encounter.      REASON FOR HOSPITALIZATION AND HOSPITAL COURSE   BRIEF HPI:  This is a 88 y.o., female admitted for     Septic joint  Right knee, Presents with increased swelling, erythematous skin changes and discharge from the right knee  -elevated CRP  ID following recommend to do IV daptomycin  daily for 2 weeks.  PICC line has been placed an antibiotic has been approved  Gram-positive cocci bacteremia  Blood cultures drawn on  admission 1/2 bottles positive for growth of Staphylococcus hominis coagulase-negative Staphylococcus likely contaminant.  Now it is just reported that patient another bottle also came back positive for Gram-positive cocci in clusters, both look contaminant.  I did discussed management with  ID following recommended IV daptomycin  500 mg daily for 2 weeks.  PICC line has been placed.    Effusion of right knee joint  Knee swelling  X-ray of the right knee shows joint effusion  Orthopedics consulted, discussed with patient's on operative versus non operative treatment recommended to continue antibiotics and will proceed with non operative treatment at this point.  PAF (paroxysmal atrial fibrillation) (CMS HCC)  Currently rate controlled.    Resume metoprolol  25 mg daily  Appears not on anticoagulation  Chronic combined systolic and diastolic CHF, NYHA class 2 (CMS HCC)  Hemodynamically stable, O2 sats stable on room air.    No signs of fluid overload, BNP 555  Resume home dose Entresto  b.i.d.  NICM (nonischemic cardiomyopathy) (CMS HCC)  Automatic  implantable cardiac defibrillator in situ     Essential hypertension  Resume metoprolol  and Entresto   Hypothyroidism  Resume home dose levothyroxine             TRANSITION/POST DISCHARGE CARE/PENDING TESTS/REFERRALS:  Outpatient follow up with ID    CONDITION ON DISCHARGE:  A. Ambulation: Ambulation with assistive device  B. Self-care Ability: With partial assistance  C. Cognitive Status Oriented x 3  D. Code status at discharge:       LINES/DRAINS/WOUNDS AT DISCHARGE:   Patient Lines/Drains/Airways Status       Active Line / Dialysis Catheter / Dialysis Graft / Drain / Airway / Wound       Name Placement date Placement time Site Days    Peripheral IV Distal;Right Basilic  (medial side of arm) 07/02/23  1824  -- 6    Peripheral IV Extended dwell catheter Anterior;Right;Upper Arm 07/09/23  1408  -- less than 1    Wound  Incision Anterior;Right Knee 07/03/23  0324  -- 6                    DISCHARGE DISPOSITION:  Home discharge  DISCHARGE INSTRUCTIONS:  Post-Discharge Follow Up Appointments       Tuesday Jul 15, 2023    Return Patient Visit with Quay Shone, MD at 11:45 AM      Thursday Sep 11, 2023    RETURN PATIENT VISIT with Valere Oneil Dawn, MD at 11:00 AM      Tuesday Oct 14, 2023    Return Patient Visit with Terisa Georgi, NP at  9:30 AM      [facility], Medical Office Building [facility] Building [place], [place]  [address]  [place] [address]  [phone] Family Medicine, [facility] Building [facility] Building [place], [place]  [address]  [place] [address]  [phone] Infectious Diseases, [facility] [place]  [employer] [place], [place]  [address]  [place] [address]  [phone]             Refer to Home Health - [facility]   Referral Type: Home Health   Requested Specialty: HOME HEALTH AGENCY   Number  of Visits Requested: 999          Payton Atlas, MD    Copies sent to Care Team  Relationship Specialty Notifications Start End    Valere Oneil Dawn, MD PCP - General FAMILY MEDICINE Admissions 03/10/23     Phone: [phone] Fax: [phone]         [address] [address]    Quay Shone, MD PCP - Infectious Disease INFECTIOUS DISEASE Admissions 07/02/23     Phone: [phone] Fax: [phone]         [address] [place] [address]            Referring providers can utilize [email or web address] to access their referred [facility] patient's information.                               [1] No Known Allergies

## 2023-07-09 NOTE — Assessment & Plan Note (Signed)
 Hemodynamically stable, O2 sats stable on room air.    No signs of fluid overload, BNP 555  Resume home dose Entresto  b.i.d.

## 2023-07-09 NOTE — Consults (Signed)
 Oddis Prost  Date of service: 07/09/2023  Date of Admission:  07/02/2023  Hospital Day:  LOS: 7 days     Subjective:  No complaints, feeling better and knee is feeling better since on daptomycin .  Waiting to be discharged home today.  Daughter at bedside    Review of Systems:    Review of Systems   All other systems reviewed and are negative.      Objective:      Vital Signs:  Vitals:    07/09/23 0011 07/09/23 0418 07/09/23 0723 07/09/23 1107   BP: (!) 104/46 (!) 119/56 134/66 (!) 144/70   Pulse: 64 66 70 62   Resp: 19 18 17 18    Temp: 36.5 C (97.7 F) 36.6 C (97.9 F) 36.8 C (98.2 F) 36.7 C (98.1 F)   SpO2: 96% 95% 97% 98%   Weight:       Height:       BMI:                Medications:  Antibiotics (last 24 hours)       Date/Time Action Medication Dose Rate    07/08/23 2058 New Bag/New Syringe    DAPTOmycin  (CUBICIN ) 500 mg in NS 50 mL IVPB 500 mg 132 mL/hr          DAPTOmycin  (CUBICIN ) 500 mg in NS 50 mL IVPB, 8 mg/kg, Intravenous, QPM  heparin  5,000 unit/mL injection, 5,000 Units, Subcutaneous, Q8HRS  levothyroxine  (SYNTHROID ) tablet 137 mcg, 137 mcg, Oral, QAM  melatonin tablet, 6 mg, Oral, HS PRN  metoprolol  succinate (TOPROL -XL) 24 hr extended release tablet, 12.5 mg, Oral, Daily  NS flush syringe, 10 mL, Intravenous, Q8HRS  NS flush syringe, 20 mL, Intravenous, Q1 MIN PRN  sacubitril -valsartan  (ENTRESTO ) 24-26 mg per tablet, 1 Tablet, Oral, 2x/day        Physical Exam:    Physical Exam  Constitutional:       Appearance: Normal appearance.   HENT:      Head: Normocephalic and atraumatic.      Nose: Nose normal.   Eyes:      Conjunctiva/sclera: Conjunctivae normal.   Cardiovascular:      Rate and Rhythm: Normal rate.   Pulmonary:      Effort: Pulmonary effort is normal.   Abdominal:      Palpations: Abdomen is soft.   Musculoskeletal:         General: No tenderness.      Cervical back: Neck supple.   Skin:     General: Skin is warm.   Neurological:      Mental Status: She is alert. Mental status is at  baseline.   Psychiatric:         Behavior: Behavior normal.          Imaging:         Labs:    No results found for any visits on 07/02/23 (from the past 24 hours).    Microbiology:  No results found for any visits on 07/02/23 (from the past 96 hours).    Assessment/ Plan:   Active Hospital Problems    Diagnosis    Primary Problem: Septic joint    Gram-positive cocci bacteremia    Knee swelling    Effusion of right knee joint    Hypothyroidism    PAF (paroxysmal atrial fibrillation) (CMS HCC)    NICM (nonischemic cardiomyopathy) (CMS HCC)    Chronic combined systolic and diastolic CHF, NYHA class 2 (CMS HCC)  Automatic implantable cardiac defibrillator in situ    Essential hypertension      88 years old lady, alert awake and comfortable, sitting with her daughter and waiting to be discharged on IV antibiotics.  No new complaints and feeling better since on daptomycin .  Her plan was to be discharged on daptomycin  500 mg daily, for 2 weeks, and follow-up in the office, script was already provided.  Reviewed again plan with daughter at bedside, regarding antibiotics IV, outpatient follow-up with Infectious Disease Service and Orthopedic service, questions answered at bedside and they agree with the plan.  On the day of the encounter, a total of   minutes was spent on this patient encounter including review of historical information, examination, documentation and post-visit activities. The time documented excludes procedural time.  Labs,cultures,imaging,and notes reviewed.     Discussed with patient and family and Discussed with Infectious disease Pharmacists    MDM Level  Problems addressed: MODERATE (>or=1 chronic illness w exacerbation, progression, or treatment side-effects; >or= 2 stable chronic illnesses; 1 undiagnosed new problem w uncertain prognosis; 1 acute illness w systemic symptoms; 1 acute complicated injury):    Data reviewed & analyzed: MODERATE: 1 of 3 following: review of external notes, review of  tests results, ordering of tests, assessment requiring an independent historian; independent interpretation of a test performed by another provider not separately reported; discussion of management or test interpretation w external provider:    Risk of morbidity: HIGH (ex: drug therapy requiring intensive monitoring, decision regarding major surgery with risk factors, decision regarding hospitalization or escalation of hospital level of care): IV antimicrobials which require intensive monitoring or high risk oral antimicrobial therapy which requires intensive monitoring      Berna Bacon, MD

## 2023-07-09 NOTE — Nurses Notes (Signed)
 Spoke with bedside nurse regarding PICC line consent. Multiple attempts made to contact [facility] without success. Will follow up in order to proceed with placement.

## 2023-07-09 NOTE — Nurses Notes (Signed)
 PICC line placed R Brachial vein 42(40) using MST without difficulty. TPS confirmation. Photos uploaded to chart. PICC OK to use.

## 2023-07-09 NOTE — Assessment & Plan Note (Signed)
 Resume metoprolol  and Entresto 

## 2023-07-09 NOTE — Assessment & Plan Note (Signed)
 Blood cultures drawn on admission 1/2 bottles positive for growth of Staphylococcus hominis coagulase-negative Staphylococcus likely contaminant.  Now it is just reported that patient another bottle also came back positive for Gram-positive cocci in clusters, both look contaminant.  I did discussed management with  ID following recommended IV daptomycin  500 mg daily for 2 weeks.  PICC line order has been placed.  I discussed management with case management still waiting authorization of IV antibiotics by insurance

## 2023-07-09 NOTE — Assessment & Plan Note (Signed)
 Resume home dose levothyroxine.

## 2023-07-09 NOTE — Progress Notes (Signed)
 [facility]  Progress Note    Oddis Prost  Date of service: 07/09/2023   Date of Admission:  07/02/2023    Hospital Day:  LOS: 7 days   Interval history- 88 year old female with known history of hypothyroidism and osteoarthritis with knee replacement x2 last 1 years ago presented to the hospital complaining of discharge from the knee joint.  Concern for septic arthritis orthopedics consulted recommended given her age conservative management and if clinically gets worse would intervene. Infectious disease consulted recommended daptomycin  for now      Subjective:  Patient seen and examined at bedside this morning.  Resting in bed without any distress.  Denies any worsening pain or swelling.  She is alert oriented x3 but hard of hearing.  No more further drainage from the knee.  Patient has increased redness on the dorsal part of the left foot that has been resolved.  It was reported that the patient bottle from 6/25 blood culture came back positive for Gram-positive cocci in clusters.  Final antibiotic duration has been set up, yet needs to be approved by insurance.    Vital Signs:  Temp  Avg: 36.6 C (97.9 F)  Min: 36.3 C (97.3 F)  Max: 36.9 C (98.4 F)    Pulse  Avg: 67.5  Min: 62  Max: 72 BP  Min: 104/46  Max: 144/70   Resp  Avg: 18  Min: 17  Max: 19 SpO2  Avg: 96.3 %  Min: 95 %  Max: 98 %          Input/Output    Intake/Output Summary (Last 24 hours) at 07/09/2023 1438  Last data filed at 07/09/2023 1300  Gross per 24 hour   Intake 1130 ml   Output --   Net 1130 ml    I/O last shift:  07/02 0700 - 07/02 1859  In: 600 [P.O.:600]  Out: -    DAPTOmycin  (CUBICIN ) 500 mg in NS 50 mL IVPB, 8 mg/kg, Intravenous, QPM  heparin  5,000 unit/mL injection, 5,000 Units, Subcutaneous, Q8HRS  levothyroxine  (SYNTHROID ) tablet 137 mcg, 137 mcg, Oral, QAM  melatonin tablet, 6 mg, Oral, HS PRN  metoprolol  succinate (TOPROL -XL) 24 hr extended release tablet, 12.5 mg, Oral, Daily  NS flush syringe, 10 mL, Intravenous,  Q8HRS  NS flush syringe, 20 mL, Intravenous, Q1 MIN PRN  sacubitril -valsartan  (ENTRESTO ) 24-26 mg per tablet, 1 Tablet, Oral, 2x/day        Physical Exam:    Age-appropriate looking elderly female , Not in acute distress   Cardiovascular: RRR, no murmurs  Respiratory: Clear to auscultation.  No wheezing  Abdomen: Soft, nontender, nondistended.  No  organomegaly  Neurologic: Alert and oriented x 3, moving all extremities.  Extremities-serous discharge from right knee wound    Labs:     No results found for any visits on 07/02/23 (from the past 24 hours).       Radiology:      Results for orders placed or performed during the hospital encounter of 07/02/23 (from the past 2160 hours)   XR KNEE RIGHT 3 VIEW     Status: None    Narrative    Kerrilyn Izzo    XR KNEE RIGHT 3 VIEW performed on 07/02/2023 12:20 PM.    INDICATION:  swelling   Additional History:  septic joint, wound/weeping mid anterior right knee, hx of multiple right knee surgeries, sepsis and psoriatic arthritis    TECHNIQUE:  3 views/3 images of the right knee submitted  for interpretation.    COMPARISON:  None available.  ___________________________________  FINDINGS:    Right knee replacement noted in anatomic alignment. Hardware intact. No lucency around the hardware. No acute fracture or dislocation. Small joint effusion. Generalized soft tissue swelling present.      Impression    Soft tissue swelling, and joint effusion.    No fracture.        Radiologist location ID: [id number]        Consults:  Orthopedics, ID  Assessment/ Plan:   Assessment & Plan  Septic joint  Right knee, Presents with increased swelling, erythematous skin changes and discharge from the right knee  -elevated CRP  ID following recommend to do IV daptomycin  daily for now   Gram-positive cocci bacteremia  Blood cultures drawn on admission 1/2 bottles positive for growth of Staphylococcus hominis coagulase-negative Staphylococcus likely contaminant.  Now it is just reported that  patient another bottle also came back positive for Gram-positive cocci in clusters, both look contaminant.  I did discussed management with  ID following recommended IV daptomycin  500 mg daily for 2 weeks.  PICC line order has been placed.  I discussed management with case management still waiting authorization of IV antibiotics by insurance  Effusion of right knee joint  Knee swelling  X-ray of the right knee shows joint effusion  Orthopedics consulted, discussed with patient's on operative versus non operative treatment recommended to continue antibiotics and will proceed with non operative treatment at this point.  PAF (paroxysmal atrial fibrillation) (CMS HCC)  Currently rate controlled.    Resume metoprolol  25 mg daily  Appears not on anticoagulation  Chronic combined systolic and diastolic CHF, NYHA class 2 (CMS HCC)  Hemodynamically stable, O2 sats stable on room air.    No signs of fluid overload, BNP 555  Resume home dose Entresto  b.i.d.  NICM (nonischemic cardiomyopathy) (CMS HCC)  Automatic implantable cardiac defibrillator in situ    Essential hypertension  Resume metoprolol  and Entresto   Hypothyroidism  Resume home dose levothyroxine          DVT/PE Prophylaxis: Heparin     Payton Atlas MD [facility] (Fellow of Hospital Medicine)

## 2023-07-10 ENCOUNTER — Other Ambulatory Visit (INDEPENDENT_AMBULATORY_CARE_PROVIDER_SITE_OTHER): Payer: Self-pay

## 2023-07-10 ENCOUNTER — Encounter (INDEPENDENT_AMBULATORY_CARE_PROVIDER_SITE_OTHER): Payer: Self-pay

## 2023-07-10 ENCOUNTER — Telehealth (HOSPITAL_BASED_OUTPATIENT_CLINIC_OR_DEPARTMENT_OTHER): Payer: Self-pay | Admitting: FAMILY PRACTICE

## 2023-07-10 DIAGNOSIS — Z7189 Other specified counseling: Secondary | ICD-10-CM

## 2023-07-10 NOTE — Nursing Note (Signed)
 Transition of Care Contact Information  Discharge Date: 07/09/2023  Transition Facility Type--Hospital (Inpatient or Observation)  Facility [facility]  Interactive Contact(s): Completed or attempted contact indicated by Date/Time  First Attempt Call: 07/10/2023 11:12 AM  Second Attempted Contact: 07/10/2023 11:25 AM  Contact Method(s)-- Patient/Caregiver Telephone, MyChart Patient Portal  Clinical Staff Name/Role who contacted--Hilman Kissling RN  Transition Note:Chart, AVS reviewed prior to contact.  Call placed to preferred number, LVM with name and contact number, purpose for call.  Encouraged follow up as advised, to please return call with any questions or concerns, and reminded of 24/7 Nurse Navigator line also found on AVS.  MyChart message also sent.     Alan Das RN

## 2023-07-10 NOTE — Progress Notes (Signed)
 Gave verbal order

## 2023-07-10 NOTE — Telephone Encounter (Signed)
 Birney, Rn- [facility] called stating that pt has a open wound on Right knee. She done's have any orders to address the wound. She also states that the wound was already opened and draining. The dressing needs changed , she doesn't have any orders nor notation addressing the wound. She is req orders for this  Please advise  Tammy [phone]  Pt daughter GLENWOOD ODEA Stine# [phone]

## 2023-07-15 ENCOUNTER — Encounter (INDEPENDENT_AMBULATORY_CARE_PROVIDER_SITE_OTHER): Payer: Self-pay | Admitting: INFECTIOUS DISEASE

## 2023-07-22 ENCOUNTER — Ambulatory Visit: Payer: Self-pay | Attending: INFECTIOUS DISEASE | Admitting: INFECTIOUS DISEASE

## 2023-07-22 ENCOUNTER — Other Ambulatory Visit: Payer: Self-pay

## 2023-07-22 ENCOUNTER — Other Ambulatory Visit (INDEPENDENT_AMBULATORY_CARE_PROVIDER_SITE_OTHER): Payer: Self-pay

## 2023-07-22 VITALS — BP 139/64 | HR 72

## 2023-07-22 DIAGNOSIS — B9689 Other specified bacterial agents as the cause of diseases classified elsewhere: Secondary | ICD-10-CM

## 2023-07-22 DIAGNOSIS — B957 Other staphylococcus as the cause of diseases classified elsewhere: Secondary | ICD-10-CM | POA: Insufficient documentation

## 2023-07-22 DIAGNOSIS — M009 Pyogenic arthritis, unspecified: Secondary | ICD-10-CM | POA: Insufficient documentation

## 2023-07-22 DIAGNOSIS — R7881 Bacteremia: Secondary | ICD-10-CM | POA: Insufficient documentation

## 2023-07-22 DIAGNOSIS — M00061 Staphylococcal arthritis, right knee: Secondary | ICD-10-CM

## 2023-07-22 MED ORDER — SODIUM CHLORIDE 0.9 % INTRAVENOUS PIGGYBACK
500.0000 mg | INTRAVENOUS | 1 refills | Status: AC
Start: 2023-07-22 — End: 2023-08-21

## 2023-07-22 NOTE — Progress Notes (Signed)
 INFECTIOUS DISEASES, [facility]  [address]  [name] [address]  Operated by [facility]       Name: Ana Kennedy MRN:  [id number]   Date: 07/22/2023 Age: 88 y.o.     Reason for Follow Up: Hospital Follow Up       Information Source: Patient and electronic medical record    History of Present Illness:  Ana Kennedy is a 88 y.o., White female here for follow up regarding hospital follow-up.  Patient came today with her family.  Patient has known history of chronic right knee infection, and history of being on suppressive oral antibiotics with cephalexin , rifampin , as directed by her Infectious Disease providers from [place] .  Suppressive regimen worked for really long time, maybe 10 years or so, but patient had breakthrough, and had to come to the hospital around June 25th, due to knee wound opening up, draining fluid, and erythema of the skin..  On admission patient had blood cultures taken which were all positive for coagulase-negative staph hominis, collected on June 25th.  Negative on June 27th.  X-ray showed no fracture but some soft tissue swelling and joint effusion.  Patient was eventually discharged home with PICC line on daptomycin  500 mg daily for 2 weeks, and she came for follow-up.  No specific complaints today pertaining to tolerance of antibiotics or PICC line, however the knee wound is still open and oozing some clear fluid.  Patient has not seen orthopedic service in outpatient setting yet.    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[place]     Cardiomyopathy, dilated (CMS HCC)     CHF (congestive heart failure)     Colon polyps     Esophageal reflux     H/O sepsis     Hypothyroidism     LBBB (left bundle branch block)     Mild cognitive disorder     Mild congestive heart failure (CMS HCC)     MSSA bacteremia     Narcolepsy     Psoriatic arthritis (CMS  HCC)     Sepsis     Sjogren's syndrome          Family History:  Family Medical History:       Problem Relation (Age of Onset)    Arthritis-osteo Father    Breast Cancer Sister, Maternal Aunt    Colon Cancer Father    Diabetes Father    Heart Disease Mother, Father    Osteoporosis Other            Allergies:  Allergies[1]  Medications:  Prior to Admission medications    Medication Sig Start Date End Date Taking? Authorizing Provider   DAPTOmycin  500 mg in NS 50 mL infusion Infuse 500 mg into a venous catheter Every 24 hours for 14 days Mix and infuse per policy of [facility]. 07/08/23 07/22/23  Loula Marcella, MD   ENTRESTO  24-26 mg Oral Tablet Take 1 Tablet by mouth Twice daily 10/30/22   Cyrena Cape, FNP   levothyroxine  (SYNTHROID ) 137 mcg Oral Tablet Take 1 Tablet (137 mcg total) by mouth Every morning for 180 days 03/10/23 09/06/23  Valere Oneil Dawn, MD   metoprolol  succinate (TOPROL -XL) 25 mg Oral Tablet Sustained Release 24 hr Take 0.5 Tablets (12.5 mg total) by mouth Daily 04/09/23   Terisa Georgi, NP   cephalexin  (KEFLEX )  500 mg Oral Capsule Take 1 Capsule (500 mg total) by mouth Twice daily Indications: 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 06/20/22 07/09/23  Kathlyne Loud, MD   cephalexin  (KEFLEX ) 500 mg Oral Capsule Take 1 Capsule (500 mg total) by mouth Twice daily 06/27/23 07/09/23  Provider, Historical   cyclobenzaprine  (FLEXERIL ) 5 mg Oral Tablet Take 1 Tablet (5 mg total) by mouth Three times a day as needed for Muscle spasms 03/10/23 07/09/23  Valere Oneil Dawn, MD   rifAMPin  (RIFADIN ) 300 mg Oral Capsule Take 1 Capsule (300 mg total) by mouth Every 12 hours Indications: suppression MSSA 06/20/22 07/09/23  Paz Fuentes, MD   rifAMPin  (RIFADIN ) 300 mg Oral Capsule Take 1 Capsule (300 mg total) by mouth Twice daily 06/30/23 07/09/23  Provider, Historical     Social History:  Social History     Socioeconomic History    Marital status: Widowed     Spouse  name: Not on file    Number of children: Not on file    Years of education: Not on file    Highest education level: Not on file   Occupational History    Not on file   Tobacco Use    Smoking status: Never    Smokeless tobacco: Never   Vaping Use    Vaping status: Never Used   Substance and Sexual Activity    Alcohol use: Not Currently    Drug use: Never    Sexual activity: Not on file   Other Topics Concern    Not on file   Social History Narrative    Not on file     Social Determinants of Health     Financial Resource Strain: Low Risk  (09/11/2022)    Financial Resource Strain     SDOH Financial: No   Transportation Needs: Low Risk  (09/11/2022)    Transportation Needs     SDOH Transportation: No   Social Connections: Low Risk  (07/03/2023)    Social Connections     SDOH Social Isolation: 5 or more times a week   Intimate Partner Violence: High Risk (09/11/2022)    Intimate Partner Violence     SDOH Domestic Violence: No   Housing Stability: Low Risk  (09/11/2022)    Housing Stability     SDOH Housing Situation: I have housing.     SDOH Housing Worry: No       Immunizations:    There is no immunization history on file for this patient.     Review of Systems:  The pertinent ROS as addressed in the HPI.    Physical Exam:  BP 139/64   Pulse 72   SpO2 98%       Physical Exam  Constitutional:       Appearance: Normal appearance.   HENT:      Head: Normocephalic and atraumatic.      Nose: Nose normal.   Eyes:      Conjunctiva/sclera: Conjunctivae normal.   Cardiovascular:      Rate and Rhythm: Normal rate.   Pulmonary:      Effort: Pulmonary effort is normal.   Abdominal:      Palpations: Abdomen is soft.   Musculoskeletal:         General: No tenderness.      Cervical back: Normal range of motion and neck supple.   Skin:     General: Skin is warm.      Findings: Lesion present.  Neurological:      Mental Status: She is alert and oriented to person, place, and time. Mental status is at baseline.   Psychiatric:          Behavior: Behavior normal.         Thought Content: Thought content normal.         Laboratory Studies:  Last BMP  (Last result in the past 2 years)        Na   K   Cl   CO2   BUN   Cr   Calcium   Glucose   Glucose-Fasting        07/08/23 0349 141   4.0   106   26   19   1.00   8.9   91               Last Hepatic Panel  (Last result in the past 2 years)        Albumin   Total PTN   Total Bili   Direct Bili   Ast/SGOT   Alt/SGPT   Alk Phos        07/02/23 1021 4.0   7.6   0.4     25   8    65              Last CBC  (Last result in the past 2 years)        WBC   HGB   HCT   MCV   Platelets      07/08/23 0349 4.3   10.1   32.5   100.0   164            Lab Results   Component Value Date    CPK 61 07/07/2023        CRP   No results found for: CREAPROINFLA   ESR   ERYTHROCYTE SEDIMENTATION RATE (ESR)   Date Value Ref Range Status   07/02/2023 61 (H) 0 - 20 mm/hr Final             Microbiology:  Lab Results   Component Value Date    BLOOD CULTURE, ROUTINE No Growth 5 Days 07/04/2023    BLOOD CULTURE, ROUTINE STAPHYLOCOCCUS, COAGULASE NEGATIVE (A) 07/02/2023    BLOOD CULTURE, ROUTINE (A) 07/02/2023     Staphylococcus hominis, Coagulase negative Staphylococcus      No results found for: CTIP, SPCF, GENCULT, GENCX, GBC, HERPESIMPLEX, MRSAMSSACULT, MYCOPNEUCUL, PJC, RECTALCULT, RSVCULT, SPUTCULT, TISS, TISCULSOLTIS, RWDC, YEASTCULT, AERWNDCU, CULTFUN, RESPW, TISW, URINECX   Susceptibility data from last 90 days.  Collected Specimen Info Organism   07/02/23 Blood STAPHYLOCOCCUS, COAGULASE NEGATIVE   07/02/23 Blood Staphylococcus hominis, Coagulase negative Staphylococcus        Imaging Studies:  Images reviewed   Recent Results (from the past 2160 hours)   XR KNEE RIGHT 3 VIEW     Status: None    Narrative    Kerie Ackley    XR KNEE RIGHT 3 VIEW performed on 07/02/2023 12:20 PM.    INDICATION:  swelling   Additional History:  septic joint, wound/weeping mid anterior right knee, hx of  multiple right knee surgeries, sepsis and psoriatic arthritis    TECHNIQUE:  3 views/3 images of the right knee submitted for interpretation.    COMPARISON:  None available.  ___________________________________  FINDINGS:    Right knee replacement noted in anatomic alignment. Hardware intact. No lucency around the hardware. No acute fracture or dislocation. Small joint effusion.  Generalized soft tissue swelling present.      Impression    Soft tissue swelling, and joint effusion.    No fracture.        Radiologist location ID: [id number]     XR AP MOBILE CHEST     Status: None    Narrative    Taj Mansfield    XR AP MOBILE CHEST performed on 07/09/2023 2:31 PM.    INDICATION:  confirm picc line placement   Additional History:  confirm picc line placement, non-smoker, hx of pacemaker, a-fib, cardiomyopathy, CHF, sepsis and htn    TECHNIQUE:  Single portable frontal view of the chest.  2 views/2 images submitted for interpretation.    COMPARISON:  None available.  ___________________________________  FINDINGS:    The cardiomediastinal silhouette is unremarkable. Multi lead left subclavian pacer. Right-sided PICC in place with tip in the SVC. Elevation right hemidiaphragm. Lungs are clear.  ___________________________________    Impression    1. No radiographic evidence of acute disease.          Radiologist location ID: [id number]          Assessment/Plan:    ICD-10-CM    1. Pyogenic arthritis of knee, due to unspecified organism, unspecified laterality (CMS HCC)  M00.9       2. Gram-positive cocci bacteremia  R78.81       3. Coagulase negative Staphylococcus bacteremia  R78.81     B95.7          Orders Placed This Encounter    DAPTOmycin  500 mg in NS 50 mL infusion    Interval history reviewed with patient and family at bedside, as well as hospital course, cultures and plan.  We discussed that daptomycin  500 mg daily will be extended, due to right knee still oozing some fluid, history of coagulase-negative Staph  hominis bacteremia in the hospital, as well as history of being on suppressive Keflex  and rifampin , with current breakthrough infection.  We discussed the need to also continue to follow with orthopedic service in outpatient.  Orders for home health daptomycin  500 mg daily for 4 more weeks, labs CBC CPK BMP and ESR.  Contact home health to forward to the office other outpatient labs that were apparently done prior.  Discussion at bedside, questions answered to verbalized understanding, and plan.    Recommendations:      Return in about 2 weeks (around 08/05/2023) for In Person Visit.  During this visit about 20 minutes of time was spent in direct communication with the patient and family, review of hospital chart from admission, evaluation and treatment plan and discussion at bedside, and post office activities including coordination of care, management of IV antibiotics and extension with the appropriate referrals and scripts and labs outpatient to home health.  Berna Bacon, MD         [1] No Known Allergies

## 2023-07-29 ENCOUNTER — Other Ambulatory Visit (INDEPENDENT_AMBULATORY_CARE_PROVIDER_SITE_OTHER): Payer: Self-pay

## 2023-07-31 ENCOUNTER — Ambulatory Visit: Payer: Self-pay | Attending: ORTHOPAEDIC SURGERY | Admitting: ORTHOPAEDIC SURGERY

## 2023-07-31 ENCOUNTER — Other Ambulatory Visit: Payer: Self-pay

## 2023-07-31 VITALS — Ht 62.0 in | Wt 132.0 lb

## 2023-07-31 DIAGNOSIS — T8453XD Infection and inflammatory reaction due to internal right knee prosthesis, subsequent encounter: Secondary | ICD-10-CM

## 2023-07-31 DIAGNOSIS — M009 Pyogenic arthritis, unspecified: Secondary | ICD-10-CM | POA: Insufficient documentation

## 2023-07-31 NOTE — Progress Notes (Signed)
 ORTHOPEDICS, [facility]  [address]  [place] [address]  Operated by [facility]    Name: Ana Kennedy MRN:  [id number]   Date: 07/31/2023 DOB:  [birth year] (88 y.o.)       Date of Appointment: 07/31/2023      History of Present Illness:    History of Present Illness  Ana Kennedy is an 88 year old female who presents for follow up of her right knee. She is accompanied by Nathanel, her daughter-in-law.    She has a history of knee replacement and recurrent infection.  She has been on suppressive antibiotics for many years. Currently, she experiences ongoing knee drainage, which is less severe than previous episodes but more problematic than past intermittent occurrences. She describes her knee as 'pretty good' and 'a little bit better.' She is receiving daily antibiotics via PICC line as part of her treatment with Dr. Quay        Physical Exam:  Ht 1.575 m (5' 2)   Wt 59.9 kg (132 lb)   BMI 24.14 kg/m       Body mass index is 24.14 kg/m.   General:  No acute distress.  Alert.  Skin:  Warm, pink, no lesions, no edema.  Head:  Atraumatic, normocephalic.     Exam:    Physical Exam  On examination of the right knee the open area of the knee has decreased in size.  There is light purulent drainage.  No erythema around the knee.  Neurovascular intact.      Radiographic/Other Studies: reviewed    XR AP MOBILE CHEST  Result Date: 07/09/2023  Marvel Vernet XR AP MOBILE CHEST performed on 07/09/2023 2:31 PM. INDICATION:  confirm picc line placement Additional History:  confirm picc line placement, non-smoker, hx of pacemaker, a-fib, cardiomyopathy, CHF, sepsis and htn TECHNIQUE:  Single portable frontal view of the chest.  2 views/2 images submitted for interpretation. COMPARISON:  None available. ___________________________________ FINDINGS: The cardiomediastinal silhouette is unremarkable. Multi lead left subclavian pacer. Right-sided PICC in place with tip in the  SVC. Elevation right hemidiaphragm. Lungs are clear. ___________________________________    1. No radiographic evidence of acute disease. Radiologist location ID: [id number]     XR KNEE RIGHT 3 VIEW  Result Date: 07/02/2023  Hayly Stockdale XR KNEE RIGHT 3 VIEW performed on 07/02/2023 12:20 PM. INDICATION:  swelling Additional History:  septic joint, wound/weeping mid anterior right knee, hx of multiple right knee surgeries, sepsis and psoriatic arthritis TECHNIQUE:  3 views/3 images of the right knee submitted for interpretation. COMPARISON:  None available. ___________________________________ FINDINGS: Right knee replacement noted in anatomic alignment. Hardware intact. No lucency around the hardware. No acute fracture or dislocation. Small joint effusion. Generalized soft tissue swelling present.     Soft tissue swelling, and joint effusion. No fracture. Radiologist location ID: [id number]           Assessment:  (M00.9) Chronic infection of right knee (CMS HCC)  (primary encounter diagnosis)      Plan:  The patients diagnosis and treatment options, both non-surgical and surgical, were discussed at length.  Risks, benefits, alternatives and potential complications of these were discussed.  Findings of any radiographic studies or laboratory evaluation were discussed with the patient.  The  patient was given the opportunity to ask questions which were  then answered to the best of my ability.  The patient was informed of any follow up care or further studies needed and showed understanding of these.  These actions were discussed with the patient.    Assessment & Plan  Chronic knee infection with drainage  Chronic knee infection with decreased drainage, managed with long-term IV antibiotics. Wound sealing slowly, she would like to avoid surgery.  - Continue daily IV antibiotics as prescribed by infectious disease.  - Apply new dressing to knee.  - Schedule follow-up appointment in 6 weeks to assess progress.  -  Instruct to call if symptoms worsen before the scheduled follow-up.      -The patient was instructed to seek medical attention urgently for new or worsening symptoms.    -All treatment options were discussed with the patient.    -All of the patient's questions were answered in the office today.    -Please call the office with any questions or concerns.                  Oneil Soda, PA-C  07/31/2023, 16:24        This note was created with assistance from [employer] via capture of conversational audio. Consent was obtained from the patient and all parties present prior to recording.

## 2023-08-05 ENCOUNTER — Other Ambulatory Visit (INDEPENDENT_AMBULATORY_CARE_PROVIDER_SITE_OTHER): Payer: Self-pay

## 2023-08-05 ENCOUNTER — Ambulatory Visit: Payer: Self-pay | Attending: INFECTIOUS DISEASE | Admitting: INFECTIOUS DISEASE

## 2023-08-05 ENCOUNTER — Other Ambulatory Visit: Payer: Self-pay

## 2023-08-05 VITALS — BP 119/58 | HR 67

## 2023-08-05 DIAGNOSIS — Z792 Long term (current) use of antibiotics: Secondary | ICD-10-CM

## 2023-08-05 DIAGNOSIS — M009 Pyogenic arthritis, unspecified: Secondary | ICD-10-CM | POA: Insufficient documentation

## 2023-08-05 DIAGNOSIS — R7881 Bacteremia: Secondary | ICD-10-CM | POA: Insufficient documentation

## 2023-08-05 DIAGNOSIS — B957 Other staphylococcus as the cause of diseases classified elsewhere: Secondary | ICD-10-CM | POA: Insufficient documentation

## 2023-08-05 NOTE — Progress Notes (Unsigned)
 INFECTIOUS DISEASES, [facility]  [address]  [place] [address]  Operated by [facility]       Name: Ana Kennedy MRN:  [id number]   Date: 08/05/2023 Age: 88 y.o.     Reason for Follow Up: Wound Check       Information Source: Patient and electronic medical record    History of Present Illness:  Ana Kennedy is a 88 y.o., White female here for follow up regarding routine follow-up.  Patient came with her family.  Patient has PICC line in place and continues daptomycin  for right knee infection, with a known history of chronic right knee infection, and history of breakthrough on prior suppressive cephalexin  and rifampin .  With daptomycin , erythema has improved, however lower aspect of the scar, is still minimally dehiscence,< 1cm.  There is no drainage.  Patient followed in orthopedic clinic.    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[place]     Cardiomyopathy, dilated (CMS HCC)     CHF (congestive heart failure)     Colon polyps     Esophageal reflux     H/O sepsis     Hypothyroidism     LBBB (left bundle branch block)     Mild cognitive disorder     Mild congestive heart failure (CMS HCC)     MSSA bacteremia     Narcolepsy     Psoriatic arthritis (CMS HCC)     Sepsis     Sjogren's syndrome          Family History:  Family Medical History:       Problem Relation (Age of Onset)    Arthritis-osteo Father    Breast Cancer Sister, Maternal Aunt    Colon Cancer Father    Diabetes Father    Heart Disease Mother, Father    Osteoporosis Other            Allergies:  Allergies[1]  Medications:  Prior to Admission medications    Medication Sig Start Date End Date Taking? Authorizing Provider   DAPTOmycin  500 mg in NS 50 mL infusion Infuse 500 mg into a venous catheter Every 24 hours for 14 days Mix and infuse per policy of [facility]. 07/08/23 07/22/23  Kourtlyn Charlet, MD    DAPTOmycin  500 mg in NS 50 mL infusion Infuse 500 mg into a venous catheter Every 24 hours for 30 days Mix and infuse per policy of [facility]. 07/22/23 08/21/23  Raider Valbuena, MD   ENTRESTO  24-26 mg Oral Tablet Take 1 Tablet by mouth Twice daily 10/30/22   Cyrena Cape, FNP   levothyroxine  (SYNTHROID ) 137 mcg Oral Tablet Take 1 Tablet (137 mcg total) by mouth Every morning for 180 days 03/10/23 09/06/23  Valere Oneil Dawn, MD   metoprolol  succinate (TOPROL -XL) 25 mg Oral Tablet Sustained Release 24 hr Take 0.5 Tablets (12.5 mg total) by mouth Daily 04/09/23   Terisa Georgi, NP   cephalexin  (KEFLEX ) 500 mg Oral Capsule Take 1 Capsule (500 mg total) by mouth Twice daily Indications: 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 06/20/22 07/09/23  Tarnisha Kachmar, MD   cephalexin  (KEFLEX ) 500 mg Oral Capsule Take 1 Capsule (500 mg total) by mouth Twice daily 06/27/23 07/09/23  Provider, Historical   cyclobenzaprine  (FLEXERIL ) 5 mg Oral Tablet Take 1 Tablet (5 mg total) by mouth  Three times a day as needed for Muscle spasms 03/10/23 07/09/23  Valere Oneil Dawn, MD   rifAMPin  (RIFADIN ) 300 mg Oral Capsule Take 1 Capsule (300 mg total) by mouth Every 12 hours Indications: suppression MSSA 06/20/22 07/09/23  Coumba Kellison, MD   rifAMPin  (RIFADIN ) 300 mg Oral Capsule Take 1 Capsule (300 mg total) by mouth Twice daily 06/30/23 07/09/23  Provider, Historical     Social History:  Social History     Socioeconomic History    Marital status: Widowed     Spouse name: Not on file    Number of children: Not on file    Years of education: Not on file    Highest education level: Not on file   Occupational History    Not on file   Tobacco Use    Smoking status: Never    Smokeless tobacco: Never   Vaping Use    Vaping status: Never Used   Substance and Sexual Activity    Alcohol use: Not Currently    Drug use: Never    Sexual activity: Not on file   Other Topics Concern    Not on file   Social  History Narrative    Not on file     Social Determinants of Health     Financial Resource Strain: Low Risk  (09/11/2022)    Financial Resource Strain     SDOH Financial: No   Transportation Needs: Low Risk  (09/11/2022)    Transportation Needs     SDOH Transportation: No   Social Connections: Low Risk  (07/03/2023)    Social Connections     SDOH Social Isolation: 5 or more times a week   Intimate Partner Violence: High Risk (09/11/2022)    Intimate Partner Violence     SDOH Domestic Violence: No   Housing Stability: Low Risk  (09/11/2022)    Housing Stability     SDOH Housing Situation: I have housing.     SDOH Housing Worry: No       Immunizations:    There is no immunization history on file for this patient.     Review of Systems:  The pertinent ROS as addressed in the HPI.    Physical Exam:  BP (!) 119/58   Pulse 67   SpO2 95%       Physical Exam  Constitutional:       Appearance: Normal appearance.   HENT:      Head: Normocephalic and atraumatic.      Nose: Nose normal.   Eyes:      Conjunctiva/sclera: Conjunctivae normal.   Cardiovascular:      Rate and Rhythm: Normal rate.   Pulmonary:      Effort: Pulmonary effort is normal.   Abdominal:      Palpations: Abdomen is soft.      Tenderness: There is no abdominal tenderness.   Musculoskeletal:         General: No swelling or tenderness.      Cervical back: Neck supple.   Skin:     General: Skin is warm.      Findings: Lesion present.   Neurological:      Mental Status: She is alert. Mental status is at baseline.   Psychiatric:         Behavior: Behavior normal.         Laboratory Studies:  Last BMP  (Last result in the past 2 years)        Na  K   Cl   CO2   BUN   Cr   Calcium   Glucose   Glucose-Fasting        07/08/23 0349 141   4.0   106   26   19   1.00   8.9   91               Last Hepatic Panel  (Last result in the past 2 years)        Albumin   Total PTN   Total Bili   Direct Bili   Ast/SGOT   Alt/SGPT   Alk Phos        07/02/23 1021 4.0   7.6   0.4     25   8     65              Last CBC  (Last result in the past 2 years)        WBC   HGB   HCT   MCV   Platelets      07/08/23 0349 4.3   10.1   32.5   100.0   164            Lab Results   Component Value Date    CPK 61 07/07/2023        CRP   No results found for: CREAPROINFLA   ESR   ERYTHROCYTE SEDIMENTATION RATE (ESR)   Date Value Ref Range Status   07/02/2023 61 (H) 0 - 20 mm/hr Final             Microbiology:  Lab Results   Component Value Date    BLOOD CULTURE, ROUTINE No Growth 5 Days 07/04/2023    BLOOD CULTURE, ROUTINE STAPHYLOCOCCUS, COAGULASE NEGATIVE (A) 07/02/2023    BLOOD CULTURE, ROUTINE (A) 07/02/2023     Staphylococcus hominis, Coagulase negative Staphylococcus      No results found for: CTIP, SPCF, GENCULT, GENCX, GBC, HERPESIMPLEX, MRSAMSSACULT, MYCOPNEUCUL, PJC, RECTALCULT, RSVCULT, SPUTCULT, TISS, TISCULSOLTIS, RWDC, YEASTCULT, AERWNDCU, CULTFUN, RESPW, TISW, URINECX   Susceptibility data from last 90 days.  Collected Specimen Info Organism   07/02/23 Blood STAPHYLOCOCCUS, COAGULASE NEGATIVE   07/02/23 Blood Staphylococcus hominis, Coagulase negative Staphylococcus        Imaging Studies:  Images reviewed   Recent Results (from the past 2160 hours)   XR KNEE RIGHT 3 VIEW     Status: None    Narrative    Marcene Feutz    XR KNEE RIGHT 3 VIEW performed on 07/02/2023 12:20 PM.    INDICATION:  swelling   Additional History:  septic joint, wound/weeping mid anterior right knee, hx of multiple right knee surgeries, sepsis and psoriatic arthritis    TECHNIQUE:  3 views/3 images of the right knee submitted for interpretation.    COMPARISON:  None available.  ___________________________________  FINDINGS:    Right knee replacement noted in anatomic alignment. Hardware intact. No lucency around the hardware. No acute fracture or dislocation. Small joint effusion. Generalized soft tissue swelling present.      Impression    Soft tissue swelling, and joint effusion.    No  fracture.        Radiologist location ID: [id number]     XR AP MOBILE CHEST     Status: None    Narrative    Jihan Thane    XR AP MOBILE CHEST performed on 07/09/2023 2:31 PM.    INDICATION:  confirm picc line placement  Additional History:  confirm picc line placement, non-smoker, hx of pacemaker, a-fib, cardiomyopathy, CHF, sepsis and htn    TECHNIQUE:  Single portable frontal view of the chest.  2 views/2 images submitted for interpretation.    COMPARISON:  None available.  ___________________________________  FINDINGS:    The cardiomediastinal silhouette is unremarkable. Multi lead left subclavian pacer. Right-sided PICC in place with tip in the SVC. Elevation right hemidiaphragm. Lungs are clear.  ___________________________________    Impression    1. No radiographic evidence of acute disease.          Radiologist location ID: [id number]          Assessment/Plan:    ICD-10-CM    1. Pyogenic arthritis of knee, due to unspecified organism, unspecified laterality (CMS HCC)  M00.9       2. Coagulase negative Staphylococcus bacteremia  R78.81     B95.7          No orders of the defined types were placed in this encounter.       Recommendations:  Interval history reviewed with patient and family.  Patient tolerates antibiotics well and reported no issues with the PICC line.  Patient continues on daptomycin .  Reviewed orthopedic office notes.  As knee is looking better and no drainage at this time we discussed planned to complete 6 weeks of IV antibiotics with daptomycin  with routine labs.  Follow-up as she approaches end of 6 weeks treatment.  If no surgical intervention planned at that time, to start on suppressive regimen again, while closely monitoring knee for any additional flares or exacerbations.  Patient and family agree with the plan.  Labs show CK of 99 creatinine of 0,9 white count of 5.4 hemoglobin 11 hematocrit 34 and sedimentation rate of 23.  In addition to continuation of antibiotics to complete  6 weeks to also continue with weekly labs.  In the event of any concern or change before the next appointment date to notify Infectious Disease office.    Return in about 2 weeks (around 08/19/2023) for In Person Visit.    Berna Bacon, MD         [1] No Known Allergies

## 2023-08-08 DIAGNOSIS — Z95810 Presence of automatic (implantable) cardiac defibrillator: Secondary | ICD-10-CM

## 2023-08-08 DIAGNOSIS — E039 Hypothyroidism, unspecified: Secondary | ICD-10-CM

## 2023-08-08 DIAGNOSIS — M3500 Sicca syndrome, unspecified: Secondary | ICD-10-CM

## 2023-08-08 DIAGNOSIS — T8453XA Infection and inflammatory reaction due to internal right knee prosthesis, initial encounter: Secondary | ICD-10-CM

## 2023-08-08 DIAGNOSIS — K219 Gastro-esophageal reflux disease without esophagitis: Secondary | ICD-10-CM

## 2023-08-08 DIAGNOSIS — M009 Pyogenic arthritis, unspecified: Secondary | ICD-10-CM

## 2023-08-08 DIAGNOSIS — I480 Paroxysmal atrial fibrillation: Secondary | ICD-10-CM

## 2023-08-08 DIAGNOSIS — I5042 Chronic combined systolic (congestive) and diastolic (congestive) heart failure: Secondary | ICD-10-CM

## 2023-08-08 DIAGNOSIS — I447 Left bundle-branch block, unspecified: Secondary | ICD-10-CM

## 2023-08-08 DIAGNOSIS — Z96643 Presence of artificial hip joint, bilateral: Secondary | ICD-10-CM

## 2023-08-08 DIAGNOSIS — G47419 Narcolepsy without cataplexy: Secondary | ICD-10-CM

## 2023-08-08 DIAGNOSIS — R7881 Bacteremia: Secondary | ICD-10-CM

## 2023-08-08 DIAGNOSIS — Z981 Arthrodesis status: Secondary | ICD-10-CM

## 2023-08-08 DIAGNOSIS — Z452 Encounter for adjustment and management of vascular access device: Secondary | ICD-10-CM

## 2023-08-08 DIAGNOSIS — I428 Other cardiomyopathies: Secondary | ICD-10-CM

## 2023-08-08 DIAGNOSIS — I110 Hypertensive heart disease with heart failure: Secondary | ICD-10-CM

## 2023-08-08 DIAGNOSIS — Z792 Long term (current) use of antibiotics: Secondary | ICD-10-CM

## 2023-08-08 DIAGNOSIS — F09 Unspecified mental disorder due to known physiological condition: Secondary | ICD-10-CM

## 2023-08-08 HISTORY — PX: STEROID INJECTION HIP: SHX2445

## 2023-08-12 ENCOUNTER — Other Ambulatory Visit (INDEPENDENT_AMBULATORY_CARE_PROVIDER_SITE_OTHER): Payer: Self-pay

## 2023-08-13 ENCOUNTER — Encounter (HOSPITAL_BASED_OUTPATIENT_CLINIC_OR_DEPARTMENT_OTHER): Payer: Self-pay | Admitting: FAMILY PRACTICE

## 2023-08-13 ENCOUNTER — Encounter (HOSPITAL_BASED_OUTPATIENT_CLINIC_OR_DEPARTMENT_OTHER): Admitting: FAMILY PRACTICE

## 2023-08-13 NOTE — Nursing Note (Signed)
 Plan of Care-07/10/23-09/07/23  [facility]   Scanned, Billed, Faxed  [facility]

## 2023-08-15 ENCOUNTER — Telehealth (INDEPENDENT_AMBULATORY_CARE_PROVIDER_SITE_OTHER): Payer: Self-pay | Admitting: INFECTIOUS DISEASE

## 2023-08-15 ENCOUNTER — Other Ambulatory Visit (INDEPENDENT_AMBULATORY_CARE_PROVIDER_SITE_OTHER): Payer: Self-pay

## 2023-08-15 NOTE — Telephone Encounter (Addendum)
 We received a call from a pharmacist at [facility] regarding Ana Kennedy's CK level on 8/4 was 504. I spoke with Dr. Quay and she had Kedra stop her Dapto and have home health redraw her CK. I called [facility] home health and they stated that they could come redraw her CK before the weekend. I asked them to send us  the results once completed. I also called Ana Kennedy's daughter, [name] and informed her to not take the IV dapto and that home health will redraw labs.  Collene Barlow, MA  08/15/2023 09:28    CK was redrawn on 8/7 and it was 609. Dr. Quay wants it redrawn on Monday. Informed home health of this.   Collene Barlow, MA  08/15/2023 10:44

## 2023-08-18 ENCOUNTER — Ambulatory Visit: Payer: Medicare Other

## 2023-08-19 ENCOUNTER — Ambulatory Visit: Payer: Self-pay | Attending: INFECTIOUS DISEASE | Admitting: INFECTIOUS DISEASE

## 2023-08-19 ENCOUNTER — Other Ambulatory Visit (INDEPENDENT_AMBULATORY_CARE_PROVIDER_SITE_OTHER): Payer: Self-pay

## 2023-08-19 ENCOUNTER — Other Ambulatory Visit: Payer: Self-pay

## 2023-08-19 VITALS — BP 105/56 | HR 65

## 2023-08-19 DIAGNOSIS — M009 Pyogenic arthritis, unspecified: Secondary | ICD-10-CM | POA: Insufficient documentation

## 2023-08-19 DIAGNOSIS — B957 Other staphylococcus as the cause of diseases classified elsewhere: Secondary | ICD-10-CM

## 2023-08-19 DIAGNOSIS — Z792 Long term (current) use of antibiotics: Secondary | ICD-10-CM

## 2023-08-19 DIAGNOSIS — M00061 Staphylococcal arthritis, right knee: Secondary | ICD-10-CM

## 2023-08-19 NOTE — Progress Notes (Unsigned)
 INFECTIOUS DISEASES, [facility]  [address]  [place] [address]  Operated by [facility]       Name: Ana Kennedy MRN:  [id number]   Date: 08/19/2023 Age: 88 y.o.     Reason for Follow Up: Wound Check       Information Source: Patient and electronic medical record    History of Present Illness:  Ana Kennedy is a 88 y.o., White female here for follow up regarding routine follow-up.  Patient came with her family and they are concerned about nonhealing lesion on the right knee.  Daptomycin  had to be discontinued due to high CPK.    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[place]     Cardiomyopathy, dilated (CMS HCC)     CHF (congestive heart failure)     Colon polyps     Esophageal reflux     H/O sepsis     Hypothyroidism     LBBB (left bundle branch block)     Mild cognitive disorder     Mild congestive heart failure (CMS HCC)     MSSA bacteremia     Narcolepsy     Psoriatic arthritis (CMS HCC)     Sepsis     Sjogren's syndrome          Family History:  Family Medical History:       Problem Relation (Age of Onset)    Arthritis-osteo Father    Breast Cancer Sister, Maternal Aunt    Colon Cancer Father    Diabetes Father    Heart Disease Mother, Father    Osteoporosis Other            Allergies:  Allergies[1]  Medications:  Prior to Admission medications    Medication Sig Start Date End Date Taking? Authorizing Provider   DAPTOmycin  500 mg in NS 50 mL infusion Infuse 500 mg into a venous catheter Every 24 hours for 14 days Mix and infuse per policy of [facility]. 07/08/23 07/22/23  Brettany Sydney, MD   DAPTOmycin  500 mg in NS 50 mL infusion Infuse 500 mg into a venous catheter Every 24 hours for 30 days Mix and infuse per policy of [facility]. 07/22/23 08/21/23  Dvid Pendry, MD   ENTRESTO  24-26 mg Oral Tablet Take 1 Tablet by mouth Twice daily 10/30/22    Cyrena Cape, FNP   levothyroxine  (SYNTHROID ) 137 mcg Oral Tablet Take 1 Tablet (137 mcg total) by mouth Every morning for 180 days 03/10/23 09/06/23  Valere Oneil Dawn, MD   metoprolol  succinate (TOPROL -XL) 25 mg Oral Tablet Sustained Release 24 hr Take 0.5 Tablets (12.5 mg total) by mouth Daily 04/09/23   Terisa Georgi, NP     Social History:  Social History     Socioeconomic History    Marital status: Widowed     Spouse name: Not on file    Number of children: Not on file    Years of education: Not on file    Highest education level: Not on file   Occupational History    Not on file   Tobacco Use    Smoking status: Never    Smokeless tobacco: Never   Vaping Use    Vaping status: Never Used   Substance and Sexual Activity    Alcohol use: Not Currently    Drug use: Never  Sexual activity: Not on file   Other Topics Concern    Not on file   Social History Narrative    Not on file     Social Determinants of Health     Financial Resource Strain: Low Risk  (09/11/2022)    Financial Resource Strain     SDOH Financial: No   Transportation Needs: Low Risk  (09/11/2022)    Transportation Needs     SDOH Transportation: No   Social Connections: Low Risk  (07/03/2023)    Social Connections     SDOH Social Isolation: 5 or more times a week   Intimate Partner Violence: High Risk (09/11/2022)    Intimate Partner Violence     SDOH Domestic Violence: No   Housing Stability: Low Risk  (09/11/2022)    Housing Stability     SDOH Housing Situation: I have housing.     SDOH Housing Worry: No       Immunizations:    There is no immunization history on file for this patient.     Review of Systems:  The pertinent ROS as addressed in the HPI.    Physical Exam:  BP (!) 105/56   Pulse 65   SpO2 97%       Physical Exam  Constitutional:       Appearance: Normal appearance.   HENT:      Head: Normocephalic and atraumatic.      Nose: Nose normal.   Eyes:      Conjunctiva/sclera: Conjunctivae normal.   Cardiovascular:      Rate and Rhythm:  Normal rate.   Pulmonary:      Effort: Pulmonary effort is normal. No respiratory distress.   Abdominal:      Palpations: Abdomen is soft.   Musculoskeletal:         General: No tenderness.      Cervical back: Neck supple.   Skin:     General: Skin is warm.      Findings: Lesion present.   Neurological:      Mental Status: She is alert and oriented to person, place, and time. Mental status is at baseline.   Psychiatric:         Behavior: Behavior normal.         Thought Content: Thought content normal.         Laboratory Studies:  Last BMP  (Last result in the past 2 years)        Na   K   Cl   CO2   BUN   Cr   Calcium   Glucose   Glucose-Fasting        07/08/23 0349 141   4.0   106   26   19   1.00   8.9   91               Last Hepatic Panel  (Last result in the past 2 years)        Albumin   Total PTN   Total Bili   Direct Bili   Ast/SGOT   Alt/SGPT   Alk Phos        07/02/23 1021 4.0   7.6   0.4     25   8    65              Last CBC  (Last result in the past 2 years)        WBC   HGB   HCT  MCV   Platelets      07/08/23 0349 4.3   10.1   32.5   100.0   164            Lab Results   Component Value Date    CPK 61 07/07/2023        CRP   No results found for: CREAPROINFLA   ESR   ERYTHROCYTE SEDIMENTATION RATE (ESR)   Date Value Ref Range Status   07/02/2023 61 (H) 0 - 20 mm/hr Final             Microbiology:  Lab Results   Component Value Date    BLOOD CULTURE, ROUTINE No Growth 5 Days 07/04/2023    BLOOD CULTURE, ROUTINE STAPHYLOCOCCUS, COAGULASE NEGATIVE (A) 07/02/2023    BLOOD CULTURE, ROUTINE (A) 07/02/2023     Staphylococcus hominis, Coagulase negative Staphylococcus      No results found for: CTIP, SPCF, GENCULT, GENCX, GBC, HERPESIMPLEX, MRSAMSSACULT, MYCOPNEUCUL, PJC, RECTALCULT, RSVCULT, SPUTCULT, TISS, TISCULSOLTIS, RWDC, YEASTCULT, AERWNDCU, CULTFUN, RESPW, TISW, URINECX   Susceptibility data from last 90 days.  Collected Specimen Info Organism   07/02/23  Blood STAPHYLOCOCCUS, COAGULASE NEGATIVE   07/02/23 Blood Staphylococcus hominis, Coagulase negative Staphylococcus        Imaging Studies:  Images reviewed   Recent Results (from the past 2160 hours)   XR KNEE RIGHT 3 VIEW     Status: None    Narrative    Ana Kennedy    XR KNEE RIGHT 3 VIEW performed on 07/02/2023 12:20 PM.    INDICATION:  swelling   Additional History:  septic joint, wound/weeping mid anterior right knee, hx of multiple right knee surgeries, sepsis and psoriatic arthritis    TECHNIQUE:  3 views/3 images of the right knee submitted for interpretation.    COMPARISON:  None available.  ___________________________________  FINDINGS:    Right knee replacement noted in anatomic alignment. Hardware intact. No lucency around the hardware. No acute fracture or dislocation. Small joint effusion. Generalized soft tissue swelling present.      Impression    Soft tissue swelling, and joint effusion.    No fracture.        Radiologist location ID: [id number]     XR AP MOBILE CHEST     Status: None    Narrative    Ana Kennedy    XR AP MOBILE CHEST performed on 07/09/2023 2:31 PM.    INDICATION:  confirm picc line placement   Additional History:  confirm picc line placement, non-smoker, hx of pacemaker, a-fib, cardiomyopathy, CHF, sepsis and htn    TECHNIQUE:  Single portable frontal view of the chest.  2 views/2 images submitted for interpretation.    COMPARISON:  None available.  ___________________________________  FINDINGS:    The cardiomediastinal silhouette is unremarkable. Multi lead left subclavian pacer. Right-sided PICC in place with tip in the SVC. Elevation right hemidiaphragm. Lungs are clear.  ___________________________________    Impression    1. No radiographic evidence of acute disease.          Radiologist location ID: [id number]          Assessment/Plan:    ICD-10-CM    1. Pyogenic arthritis of knee, due to unspecified organism, unspecified laterality (CMS HCC)  M00.9          No orders  of the defined types were placed in this encounter.     88 years old lady came today with her family for follow-up.  Patient  was discharged from the hospital on daptomycin  due to problems with chronic right knee infection and failure of prior suppressive oral antibiotics.  Patient received treatment with daptomycin  however routine labs reviewed worsening CPK that peaked at 609.  Daptomycin  was discontinued on 8/8.  Daptomycin  was already discontinued.  Weekly labs need to continue with CBC CRP BMP ESR as well as CPK until it normalizes.  If possible they seem to want to consider retaining PICC line for IV access.  Discussed with family and patient regarding residual findings on the right knee including a small area that is protruding and not fully resolved and they were asking questions about possible intervention pertaining to this, and will be following with orthopedic office to review current status and determine further treatment plan.  Reviewed last orthopedic office note, but at this time patient might be interested in intervention if needed.  Patient will go back on suppressive oral antibiotics, pending further follow-up with orthopedic clinic.  In the event of any worsening or change on oral therapy to please notify ID office.  Possible history of poor GI tolerance with doxycycline.  Recommendations:      Return in about 2 weeks (around 09/02/2023) for In Person Visit.  During this office visit about 20 minutes of time was spent in direct communication with patient and family, reviewing current situation as stated above, evaluation and treatment plan, and post office activities and coordination of care.  Berna Bacon, MD         [1] No Known Allergies

## 2023-08-20 MED ORDER — CEPHALEXIN 500 MG CAPSULE
500.0000 mg | ORAL_CAPSULE | Freq: Four times a day (QID) | ORAL | 0 refills | Status: AC
Start: 2023-08-20 — End: 2023-10-01

## 2023-08-25 ENCOUNTER — Encounter (HOSPITAL_BASED_OUTPATIENT_CLINIC_OR_DEPARTMENT_OTHER): Payer: Self-pay | Admitting: FAMILY PRACTICE

## 2023-08-26 ENCOUNTER — Other Ambulatory Visit (INDEPENDENT_AMBULATORY_CARE_PROVIDER_SITE_OTHER): Payer: Self-pay

## 2023-08-29 ENCOUNTER — Encounter (INDEPENDENT_AMBULATORY_CARE_PROVIDER_SITE_OTHER): Payer: Self-pay | Admitting: INFECTIOUS DISEASE

## 2023-09-01 ENCOUNTER — Encounter (HOSPITAL_BASED_OUTPATIENT_CLINIC_OR_DEPARTMENT_OTHER): Payer: Self-pay | Admitting: FAMILY PRACTICE

## 2023-09-02 ENCOUNTER — Other Ambulatory Visit (INDEPENDENT_AMBULATORY_CARE_PROVIDER_SITE_OTHER): Payer: Self-pay

## 2023-09-05 ENCOUNTER — Encounter (HOSPITAL_BASED_OUTPATIENT_CLINIC_OR_DEPARTMENT_OTHER): Payer: Self-pay | Admitting: FAMILY PRACTICE

## 2023-09-10 ENCOUNTER — Other Ambulatory Visit (INDEPENDENT_AMBULATORY_CARE_PROVIDER_SITE_OTHER): Payer: Self-pay

## 2023-09-11 ENCOUNTER — Ambulatory Visit (HOSPITAL_BASED_OUTPATIENT_CLINIC_OR_DEPARTMENT_OTHER): Payer: Self-pay | Admitting: FAMILY PRACTICE

## 2023-09-11 ENCOUNTER — Ambulatory Visit (HOSPITAL_BASED_OUTPATIENT_CLINIC_OR_DEPARTMENT_OTHER): Payer: Self-pay | Admitting: Family Medicine

## 2023-09-16 ENCOUNTER — Encounter (INDEPENDENT_AMBULATORY_CARE_PROVIDER_SITE_OTHER): Payer: Self-pay | Admitting: INFECTIOUS DISEASE

## 2023-09-18 ENCOUNTER — Ambulatory Visit: Payer: Self-pay | Attending: ORTHOPAEDIC SURGERY | Admitting: ORTHOPAEDIC SURGERY

## 2023-09-18 ENCOUNTER — Encounter (HOSPITAL_BASED_OUTPATIENT_CLINIC_OR_DEPARTMENT_OTHER): Payer: Self-pay | Admitting: ORTHOPAEDIC SURGERY

## 2023-09-18 ENCOUNTER — Other Ambulatory Visit: Payer: Self-pay

## 2023-09-18 VITALS — Ht 59.0 in | Wt 130.0 lb

## 2023-09-18 DIAGNOSIS — M009 Pyogenic arthritis, unspecified: Secondary | ICD-10-CM | POA: Insufficient documentation

## 2023-09-18 DIAGNOSIS — M160 Bilateral primary osteoarthritis of hip: Secondary | ICD-10-CM | POA: Insufficient documentation

## 2023-09-18 DIAGNOSIS — M1612 Unilateral primary osteoarthritis, left hip: Secondary | ICD-10-CM

## 2023-09-18 MED ORDER — LIDOCAINE HCL 10 MG/ML (1 %) INJECTION SOLUTION
2.0000 mL | Freq: Once | INTRAMUSCULAR | Status: AC
Start: 2023-09-18 — End: 2023-09-18
  Administered 2023-09-18: 20 mg via INTRAMUSCULAR

## 2023-09-18 MED ORDER — METHYLPREDNISOLONE ACETATE 40 MG/ML SUSPENSION FOR INJECTION
40.0000 mg | INTRAMUSCULAR | Status: AC
Start: 2023-09-18 — End: 2023-09-18
  Administered 2023-09-18: 40 mg via INTRA_ARTICULAR

## 2023-09-19 NOTE — Progress Notes (Signed)
 ORTHOPEDICS, [facility]  [address]  [place] [address]  Operated by [facility]    Name: Ana Kennedy MRN:  [id number]   Date: 09/18/2023 DOB:  12-Feb-1935 (88 y.o.)       Date of Appointment: 09/18/2023      History of Present Illness:    History of Present Illness  Ana Kennedy is an 88 year old female who presents with right knee drainage and left hip pain.    Her right knee, previously stable for years, began leaking again three days ago. The knee is swollen, and she changes the bandage daily, using extra gauze due to heavy leakage. The nurse has not been visiting because of an upcoming infectious disease consultation.    Two weeks ago, she experienced severe left hip pain, similar to an episode in April.  She previously had x-rays at [facility] which were reviewed today showing degenerative changes with decreased joint space.  The pain is severe, located in both the anterior groin and back of the hip, and worsens at night. Advil does not alleviate the pain, and she uses small amounts of whiskey at night for temporary relief. She is not sleeping well due to the pain and is seeking relief options.        Physical Exam:  Ht 1.499 m (4' 11)   Wt 59 kg (130 lb)   BMI 26.26 kg/m       Body mass index is 26.26 kg/m.   General:  No acute distress.  Alert and oriented.  Skin:  Warm, pink, no lesions, no edema.  Head:  Atraumatic, normocephalic.     Exam:    Physical Exam  Left hip  Inspection:no sign of acute trauma, no ecchymosis, no erythema  Alignment:overall normal alignment of the lower extremity  Palpation:no tenderness over GT, no tenderness palpation over anterior or posterior hip  Range of motion:  Decreased motion secondary to pain  Vascular:no gross signs of vascular compromise lower extremity  Neuro:intact sensation around the hip, foot neurovascularly intact  Strength:  Weakness due to pain  Stability:no gross instability of the hip    On examination of the right knee  drainage from a sinus tract at the anterior knee.  Minimal surrounding erythema.  Good motion of the knee.  Neurovascular intact      Radiographic/Other Studies: reviewed    No results were found from the past 30 days.        Assessment:  (M16.0) Osteoarthritis of hips, bilateral  (primary encounter diagnosis)  Plan: 20611-50 - B/L ARTHROCENTESIS ASPIRATION/INJ,         MAJOR JOINT/BURSA         (SHOULDER/HIP/KNEE/SUBACROMIAL BURSA); W         US /RECORDING/REPORTING(AMB ONLY-PD)    (M00.9) Chronic infection of right knee (CMS HCC)      Plan:  The patients diagnosis and treatment options, both non-surgical and surgical, were discussed at length.  Risks, benefits, alternatives and potential complications of these were discussed.  Findings of any radiographic studies or laboratory evaluation were discussed with the patient.  The  patient was given the opportunity to ask questions which were  then answered to the best of my ability.  The patient was informed of any follow up care or further studies needed and showed understanding of these.  These actions were discussed with the patient.    Assessment & Plan  Severe left hip osteoarthritis pain  Severe osteoarthritis in left hip, unrelieved by Advil.  X-rays confirm arthritis. Pain likely from hip joint and SI joint.  - Administer cortisone injection into left hip joint using ultrasound guidance.  - Monitor pain relief to differentiate between hip joint and SI pain.  - Consider repeat cortisone injections every three months if effective.    Chronic right knee wound with recurrent drainage  Chronic right knee wound with increased drainage. Previously on oral antibiotics with initial improvement.   - Provide dressing for knee wound.  - Coordinate with infectious disease for potential removal of port.  - continue follow up with Infectious Disease regarding antibiotics    Follow up in 3 months.      -The patient was instructed to seek medical attention urgently for new or  worsening symptoms.    -All treatment options were discussed with the patient.    -All of the patient's questions were answered in the office today.    -Please call the office with any questions or concerns.           Orders Placed This Encounter    20611-50 - B/L ARTHROCENTESIS ASPIRATION/INJ, MAJOR JOINT/BURSA (SHOULDER/HIP/KNEE/SUBACROMIAL BURSA); W US /RECORDING/REPORTING(AMB ONLY-PD)    lidocaine  1% injection    methylPREDNISolone  acetate (DEPO-medrol ) 40 mg/mL injection          Oneil Soda, PA-C  09/19/2023, 10:53        This note was created with assistance from [employer] via capture of conversational audio. Consent was obtained from the patient and all parties present prior to recording.

## 2023-09-19 NOTE — Procedures (Signed)
 ORTHOPEDICS, [facility] ORTHOPED[facility]  [address]  [place] [address]  Operated by [facility]  Procedure Note    Name: Ana Kennedy MRN:  [id number]   Date: 09/18/2023 DOB:  [birth year] (88 y.o.)         20611-50 - B/L ARTHROCENTESIS ASPIRATION/INJ, MAJOR JOINT/BURSA (SHOULDER/HIP/KNEE/SUBACROMIAL BURSA); W US /RECORDING/REPORTING(AMB ONLY-PD)    Performed by: Eligio Anes, PA-C  Authorized by: Burt Cough, DO    Documentation:      Under informed consent and sterile technique the left hip was visualized using ultrasound.  The hip was then cleansed with alcohol and the needle inserted into the hip capsule using ultrasound for needle localization.  The hip was then injected with 2 cc of local anesthetic and 40 mg Depo-Medrol  .  The patient tolerated the procedure well and 3 images were saved.  A Band-Aid was applied.      Anes Eligio, PA-C

## 2023-09-23 ENCOUNTER — Encounter (INDEPENDENT_AMBULATORY_CARE_PROVIDER_SITE_OTHER): Payer: Self-pay | Admitting: INFECTIOUS DISEASE

## 2023-09-23 ENCOUNTER — Ambulatory Visit: Payer: Self-pay | Attending: INFECTIOUS DISEASE | Admitting: INFECTIOUS DISEASE

## 2023-09-23 ENCOUNTER — Other Ambulatory Visit (INDEPENDENT_AMBULATORY_CARE_PROVIDER_SITE_OTHER): Payer: Self-pay

## 2023-09-23 ENCOUNTER — Other Ambulatory Visit: Payer: Self-pay

## 2023-09-23 VITALS — BP 115/67 | HR 74

## 2023-09-23 DIAGNOSIS — B9561 Methicillin susceptible Staphylococcus aureus infection as the cause of diseases classified elsewhere: Secondary | ICD-10-CM

## 2023-09-23 DIAGNOSIS — G47419 Narcolepsy without cataplexy: Secondary | ICD-10-CM

## 2023-09-23 DIAGNOSIS — T8453XA Infection and inflammatory reaction due to internal right knee prosthesis, initial encounter: Secondary | ICD-10-CM

## 2023-09-23 DIAGNOSIS — I480 Paroxysmal atrial fibrillation: Secondary | ICD-10-CM

## 2023-09-23 DIAGNOSIS — I428 Other cardiomyopathies: Secondary | ICD-10-CM

## 2023-09-23 DIAGNOSIS — M009 Pyogenic arthritis, unspecified: Secondary | ICD-10-CM

## 2023-09-23 DIAGNOSIS — I110 Hypertensive heart disease with heart failure: Secondary | ICD-10-CM

## 2023-09-23 DIAGNOSIS — M00061 Staphylococcal arthritis, right knee: Secondary | ICD-10-CM

## 2023-09-23 DIAGNOSIS — A4901 Methicillin susceptible Staphylococcus aureus infection, unspecified site: Secondary | ICD-10-CM | POA: Insufficient documentation

## 2023-09-23 DIAGNOSIS — I5042 Chronic combined systolic (congestive) and diastolic (congestive) heart failure: Secondary | ICD-10-CM

## 2023-09-23 DIAGNOSIS — E039 Hypothyroidism, unspecified: Secondary | ICD-10-CM

## 2023-09-23 NOTE — Progress Notes (Signed)
 INFECTIOUS DISEASES, [facility]  [address]  [place] [address]  Operated by [facility]       Name: Ana Kennedy MRN:  [id number]   Date: 09/23/2023 Age: 88 y.o.     Reason for Follow Up: Wound Check       Information Source: Patient and electronic medical record    History of Present Illness:  Ana Kennedy is a 88 y.o., White female here for follow up regarding routine follow-up.  At this time there were no specific complaints pertaining to right knee.  Patient finish her treatment with daptomycin , which eventually had to be discontinued due to increasing CPK.  Patient resumed her oral suppressive antibiotics, and patient also follows with orthopedic office.  Patient reported history of steroid injection to her left hip about a week ago, and reported ongoing severe hip pain, and steroid not helping.    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[place]     Cardiomyopathy, dilated (CMS HCC)     CHF (congestive heart failure)     Colon polyps     Esophageal reflux     H/O sepsis     Hypothyroidism     LBBB (left bundle branch block)     Mild cognitive disorder     Mild congestive heart failure (CMS HCC)     MSSA bacteremia     Narcolepsy     Psoriatic arthritis (CMS HCC)     Sepsis     Sjogren's syndrome          Family History:  Family Medical History:       Problem Relation (Age of Onset)    Arthritis-osteo Father    Breast Cancer Sister, Maternal Aunt    Colon Cancer Father    Diabetes Father    Heart Disease Mother, Father    Osteoporosis Other            Allergies:  Allergies[1]  Medications:  Prior to Admission medications   Medication Sig Start Date End Date Taking? Authorizing Provider   cephalexin  (KEFLEX ) 500 mg Oral Capsule Take 1 Capsule (500 mg total) by mouth Four times a day for 30 days 08/20/23 09/19/23  Malakye Nolden, MD   ENTRESTO  24-26 mg Oral Tablet Take 1  Tablet by mouth Twice daily 10/30/22   Cyrena Cape, FNP   levothyroxine  (SYNTHROID ) 137 mcg Oral Tablet Take 1 Tablet (137 mcg total) by mouth Every morning for 180 days 03/10/23 09/06/23  Valere Oneil Dawn, MD   metoprolol  succinate (TOPROL -XL) 25 mg Oral Tablet Sustained Release 24 hr Take 0.5 Tablets (12.5 mg total) by mouth Daily 04/09/23   Terisa Georgi, NP     Social History:  Social History     Socioeconomic History    Marital status: Widowed     Spouse name: Not on file    Number of children: Not on file    Years of education: Not on file    Highest education level: Not on file   Occupational History    Not on file   Tobacco Use    Smoking status: Never    Smokeless tobacco: Never   Vaping Use    Vaping status: Never Used   Substance and Sexual Activity    Alcohol use: Not Currently    Drug use: Never    Sexual activity: Not on  file   Other Topics Concern    Not on file   Social History Narrative    Not on file     Social Determinants of Health     Financial Resource Strain: Low Risk (09/11/2022)    Financial Resource Strain     SDOH Financial: No   Transportation Needs: Low Risk (09/11/2022)    Transportation Needs     SDOH Transportation: No   Social Connections: Low Risk (07/03/2023)    Social Connections     SDOH Social Isolation: 5 or more times a week   Intimate Partner Violence: High Risk (09/11/2022)    Intimate Partner Violence     SDOH Domestic Violence: No   Housing Stability: Low Risk (09/11/2022)    Housing Stability     SDOH Housing Situation: I have housing.     SDOH Housing Worry: No       Immunizations:    There is no immunization history on file for this patient.     Review of Systems:  The pertinent ROS as addressed in the HPI.    Physical Exam:  BP 115/67   Pulse 74   SpO2 98%       Physical Exam    Laboratory Studies:  Last BMP  (Last result in the past 2 years)        Na   K   Cl   CO2   BUN   Cr   Calcium   Glucose   Glucose-Fasting        07/08/23 0349 141   4.0   106   26   19    1.00   8.9   91               Last Hepatic Panel  (Last result in the past 2 years)        Albumin   Total PTN   Total Bili   Direct Bili   Ast/SGOT   Alt/SGPT   Alk Phos        07/02/23 1021 4.0   7.6   0.4     25   8    65              Last CBC  (Last result in the past 2 years)        WBC   HGB   HCT   MCV   Platelets      07/08/23 0349 4.3   10.1   32.5   100.0   164            Lab Results   Component Value Date    CPK 61 07/07/2023        CRP   No results found for: CREAPROINFLA   ESR   ERYTHROCYTE SEDIMENTATION RATE (ESR)   Date Value Ref Range Status   07/02/2023 61 (H) 0 - 20 mm/hr Final             Microbiology:  Lab Results   Component Value Date    BLOOD CULTURE, ROUTINE No Growth 5 Days 07/04/2023    BLOOD CULTURE, ROUTINE STAPHYLOCOCCUS, COAGULASE NEGATIVE (A) 07/02/2023    BLOOD CULTURE, ROUTINE (A) 07/02/2023     Staphylococcus hominis, Coagulase negative Staphylococcus      No results found for: CTIP, SPCF, GENCULT, GENCX, GBC, HERPESIMPLEX, MRSAMSSACULT, MYCOPNEUCUL, PJC, RECTALCULT, RSVCULT, SPUTCULT, TISS, TISCULSOLTIS, RWDC, YEASTCULT, AERWNDCU, CULTFUN, RESPW, TISW, URINECX   Susceptibility data from last 90 days.  Collected  Specimen Info Organism   07/02/23 Blood STAPHYLOCOCCUS, COAGULASE NEGATIVE   07/02/23 Blood Staphylococcus hominis, Coagulase negative Staphylococcus        Imaging Studies:  Images reviewed   Recent Results (from the past 2160 hours)   XR KNEE RIGHT 3 VIEW     Status: None    Narrative    Ana Kennedy    XR KNEE RIGHT 3 VIEW performed on 07/02/2023 12:20 PM.    INDICATION:  swelling   Additional History:  septic joint, wound/weeping mid anterior right knee, hx of multiple right knee surgeries, sepsis and psoriatic arthritis    TECHNIQUE:  3 views/3 images of the right knee submitted for interpretation.    COMPARISON:  None available.  ___________________________________  FINDINGS:    Right knee replacement noted in anatomic  alignment. Hardware intact. No lucency around the hardware. No acute fracture or dislocation. Small joint effusion. Generalized soft tissue swelling present.      Impression    Soft tissue swelling, and joint effusion.    No fracture.        Radiologist location ID: [id number]     XR AP MOBILE CHEST     Status: None    Narrative    Jaylon Shuler    XR AP MOBILE CHEST performed on 07/09/2023 2:31 PM.    INDICATION:  confirm picc line placement   Additional History:  confirm picc line placement, non-smoker, hx of pacemaker, a-fib, cardiomyopathy, CHF, sepsis and htn    TECHNIQUE:  Single portable frontal view of the chest.  2 views/2 images submitted for interpretation.    COMPARISON:  None available.  ___________________________________  FINDINGS:    The cardiomediastinal silhouette is unremarkable. Multi lead left subclavian pacer. Right-sided PICC in place with tip in the SVC. Elevation right hemidiaphragm. Lungs are clear.  ___________________________________    Impression    1. No radiographic evidence of acute disease.          Radiologist location ID: [id number]          Assessment/Plan:    ICD-10-CM    1. MSSA (methicillin susceptible Staphylococcus aureus) infection  A49.01 DME - INFUSION AND LINE CARE ORDERS      2. Pyogenic arthritis of knee, due to unspecified organism, unspecified laterality (CMS HCC)  M00.9 DME - INFUSION AND LINE CARE ORDERS         Orders Placed This Encounter    DME - INFUSION AND LINE CARE ORDERS    Interval history reviewed with patient and daughter.  Right knee is stable, no swelling or redness or specific drainage, and to continue with suppressive oral therapy.  Prior to that patient was on daptomycin  which had to be discontinued due to high CPK level.  They slowly trended down and per labs from September 8th from [facility] came down to 141  CRP is 6 white count 4 hemoglobin 11 hematocrit 34 platelets 176 and sedimentation rate is 31.  Reviewed orthopedic  office note as well as procedure note pertaining to left hip 40 mg Depo-Medrol .  Patient is complaining of severe pain in the hip, and she will contact orthopedic office for further recommendations and treatment.  From ID perspective to continue suppressive therapy for her right knee, and removal of indwelling line.    Recommendations:      Return in about 4 weeks (around 10/21/2023) for In Person Visit.  During this visit approximately 20 minutes of time was spent in direct communication face-to-face with  patient and daughter, reviewing the labs, orthopedic notes and procedures, evaluation management and treatment plan, coordination of care, including DME for removal of indwelling line.  Berna Bacon, MD         [1] No Known Allergies

## 2023-09-28 ENCOUNTER — Other Ambulatory Visit: Payer: Self-pay

## 2023-09-28 ENCOUNTER — Emergency Department (HOSPITAL_COMMUNITY)

## 2023-09-28 ENCOUNTER — Encounter (HOSPITAL_COMMUNITY): Payer: Self-pay

## 2023-09-28 ENCOUNTER — Emergency Department
Admission: EM | Admit: 2023-09-28 | Discharge: 2023-09-28 | Disposition: A | Discharge status: Home / Self Care | Attending: Student in an Organized Health Care Education/Training Program | Admitting: Student in an Organized Health Care Education/Training Program

## 2023-09-28 DIAGNOSIS — M47816 Spondylosis without myelopathy or radiculopathy, lumbar region: Secondary | ICD-10-CM | POA: Insufficient documentation

## 2023-09-28 DIAGNOSIS — M1612 Unilateral primary osteoarthritis, left hip: Secondary | ICD-10-CM

## 2023-09-28 DIAGNOSIS — M160 Bilateral primary osteoarthritis of hip: Secondary | ICD-10-CM | POA: Insufficient documentation

## 2023-09-28 DIAGNOSIS — G8929 Other chronic pain: Secondary | ICD-10-CM | POA: Insufficient documentation

## 2023-09-28 DIAGNOSIS — M25552 Pain in left hip: Secondary | ICD-10-CM | POA: Insufficient documentation

## 2023-09-28 MED ORDER — ONDANSETRON HCL (PF) 4 MG/2 ML INJECTION SOLUTION
INTRAMUSCULAR | Status: AC
Start: 2023-09-28 — End: 2023-09-28
  Filled 2023-09-28: qty 2

## 2023-09-28 MED ORDER — ONDANSETRON HCL (PF) 4 MG/2 ML INJECTION SOLUTION
4.0000 mg | INTRAMUSCULAR | Status: AC
Start: 2023-09-28 — End: 2023-09-28
  Administered 2023-09-28: 4 mg via INTRAVENOUS

## 2023-09-28 MED ORDER — HYDROCODONE 10 MG-ACETAMINOPHEN 325 MG TABLET
1.0000 | ORAL_TABLET | Freq: Four times a day (QID) | ORAL | 0 refills | Status: DC | PRN
Start: 2023-09-28 — End: 2023-10-01

## 2023-09-28 MED ORDER — HYDROCODONE 5 MG-ACETAMINOPHEN 325 MG TABLET
ORAL_TABLET | ORAL | Status: AC
Start: 2023-09-28 — End: 2023-09-28
  Filled 2023-09-28: qty 1

## 2023-09-28 MED ORDER — HYDROCODONE 5 MG-ACETAMINOPHEN 325 MG TABLET
1.0000 | ORAL_TABLET | ORAL | Status: AC
Start: 2023-09-28 — End: 2023-09-28
  Administered 2023-09-28: 1 via ORAL

## 2023-09-28 MED ORDER — DOCUSATE SODIUM 50 MG CAPSULE
50.0000 mg | ORAL_CAPSULE | Freq: Two times a day (BID) | ORAL | Status: DC
Start: 2023-09-28 — End: 2023-11-12

## 2023-09-28 MED ORDER — HYDROMORPHONE 0.5 MG/0.5 ML INJECTION SYRINGE
INJECTION | INTRAMUSCULAR | Status: AC
Start: 2023-09-28 — End: 2023-09-28
  Filled 2023-09-28: qty 0.5

## 2023-09-28 MED ORDER — HYDROMORPHONE 0.5 MG/0.5 ML INJECTION SYRINGE
0.5000 mg | INJECTION | INTRAMUSCULAR | Status: AC
Start: 2023-09-28 — End: 2023-09-28
  Administered 2023-09-28: 0.5 mg via INTRAVENOUS

## 2023-09-28 NOTE — ED Provider Notes (Signed)
 [facility] - Emergency Department  ED Physician Note      Arrival: Car    Chief Complaint:    Chief Complaint   Patient presents with    Hip Pain     Non-traumatic L hip pain x 2 weeks that has become progressively worse. Follows with Dr. Burt. Pt received steroid injection with no improvement.      Ana Kennedy is a 88 y.o. female who had concerns including Hip Pain.    History of Present Illness:    History provided by:  Patient    Ana Kennedy is an 88 year old female with a past medical history of arthritis that presents for evaluation of left hip pain.  Patient reports if she has chronic pain in his left hip and then was evaluated by Orthopedics 2 weeks ago and received a steroid injection.  She states the pain has actually worsened since then.  She states she has tried NSAIDs, Tylenol , muscle relaxers, steroids without any relief.  She reports she is desperate for something to help her with her pain.  She denies any new injury or other symptoms.  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[place]     Cardiomyopathy, dilated (CMS HCC)     CHF (congestive heart failure)     Colon polyps     Esophageal reflux     H/O sepsis     Hypothyroidism     LBBB (left bundle branch block)     Mild cognitive disorder     Mild congestive heart failure (CMS HCC)     MSSA bacteremia     Narcolepsy     Psoriatic arthritis (CMS HCC)     Sepsis     Sjogren's syndrome        Past Surgical History:   Procedure Laterality Date    Colonoscopy      Esophagogastroduodenoscopy      Hip arthroplasty Bilateral     Hx appendectomy      Hx cataract removal      Hx cholecystectomy      Hx hernia repair      Hx hysterectomy      Hx knee surgery Bilateral     Hx lumbar fusion      Hx pacemaker insertion      Hx tonsillectomy         Family History   Problem Relation Age of Onset    Heart Disease Mother     Heart Disease Father     Diabetes  Father     Arthritis-osteo Father     Colon Cancer Father     Breast Cancer Sister     Breast Cancer Maternal Aunt     Osteoporosis Other        Social History     Socioeconomic History    Marital status: Widowed     Spouse name: Not on file    Number of children: Not on file    Years of education: Not on file    Highest education level: Not on file   Occupational History    Not on file   Tobacco Use    Smoking status: Never    Smokeless tobacco: Never   Vaping Use    Vaping status: Never Used   Substance and Sexual Activity    Alcohol use: Not  Currently    Drug use: Never    Sexual activity: Not on file   Other Topics Concern    Not on file   Social History Narrative    Not on file     Social Determinants of Health     Financial Resource Strain: Low Risk (09/11/2022)    Financial Resource Strain     SDOH Financial: No   Transportation Needs: Low Risk (09/11/2022)    Transportation Needs     SDOH Transportation: No   Social Connections: Low Risk (07/03/2023)    Social Connections     SDOH Social Isolation: 5 or more times a week   Intimate Partner Violence: High Risk (09/11/2022)    Intimate Partner Violence     SDOH Domestic Violence: No   Housing Stability: Low Risk (09/11/2022)    Housing Stability     SDOH Housing Situation: I have housing.     SDOH Housing Worry: No       Meds/Allergies:    Current Outpatient Medications   Medication Instructions    docusate sodium  (COLACE) 50 mg, Oral, 2 TIMES DAILY    ENTRESTO  24-26 mg Oral Tablet 1 Tablet, Oral, 2 TIMES DAILY    HYDROcodone -acetaminophen  (NORCO) 10-325 mg Oral Tablet 1 Tablet, Oral, EVERY 6 HOURS PRN    levothyroxine  (SYNTHROID ) 137 mcg, Oral, EVERY MORNING    metoprolol  succinate (TOPROL -XL) 12.5 mg, Oral, Daily      Allergies[1]    Physical Exam:    ED Triage Vitals [09/28/23 1157]   BP (Non-Invasive) (!) 155/70   Heart Rate 69   Respiratory Rate 15   Temperature 36.5 C (97.7 F)   SpO2 100 %   Weight 59 kg (130 lb)   Height 1.499 m (4' 11)     Physical Exam    General: Well-developed, well-nourished, non-diaphoretic. No apparent distress         Head: Normocephalic; atraumatic.            EENT: Normal conjunctivae. Mucosa is pink and moist.            Neck: Neck is supple; Full ROM.            Pulmonary: Normal effort. Breath sounds clear to auscultation bilaterally. No rhonchi. No rales. No wheezing.            Cardiovascular: Regular rate, regular rhythm. No murmurs, rubs, or gallops. Intact distal pulses.          Abdomen: Abdomen is soft, nontender, and non-distended. No rebound. No guarding.            Musculoskeletal: Normal ROM.  No edema or deformities.          Skin: No rashes; skin is warm, dry, and intact.            Neurological: Alert and oriented x 3. Moves all 4 extremities. No obvious gross cranial nerve deficit.         Psychiatric: Mood is normal. Affect is normal.       Results:  Labs Ordered/Reviewed - No data to display    XR HIP LEFT W PELVIS 2-3 VIEWS   Final Result by Edi, Radresults In (09/21 1415)      Severe degenerative changes of the right hip, moderate degenerative changes of the left hip, and marked degenerative changes of the inferior lumbar spine. No acute fracture.            Radiologist location ID: [id number]  ED Course:       Medications Ordered/Administered in the ED   HYDROcodone -acetaminophen  (NORCO) 5-325 mg per tablet (1 Tablet Oral Given 09/28/23 1236)   HYDROmorphone  (DILAUDID ) 0.5 mg/0.5 mL injection (0.5 mg Intravenous Given 09/28/23 1316)   ondansetron  (ZOFRAN ) 2 mg/mL injection (4 mg Intravenous Given 09/28/23 1316)       MDM:       Medical Decision Making  Amount and/or Complexity of Data Reviewed  Radiology: ordered and independent interpretation performed. Decision-making details documented in ED Course.    Risk  Prescription drug management.  Parenteral controlled substances.      Ana Kennedy is an 88 year old female with a past medical history of arthritis that presents for evaluation of left hip pain.   Patient reports if she has chronic pain in his left hip and then was evaluated by Orthopedics 2 weeks ago and received a steroid injection.  She states the pain has actually worsened since then.  She states she has tried NSAIDs, Tylenol , muscle relaxers, steroids without any relief.  She reports she is desperate for something to help her with her pain.  She denies any new injury or other symptoms.  X-ray of the left hip and pelvis was ordered.    Patient's pain was not improved after dose of Norco.  I have ordered a dose of Dilaudid .    X-ray shows severe arthritis but no fracture.  Her pain is much improved after dose of Dilaudid .  I did long discussion with the patient and daughter.  She has tried everything for pain and states that that has causing her be depressed.  She states she can not live like this any longer.  She is not suicidal.  She reports she is going to get scheduled for hip replacement surgery.  In the meantime I will give her a 3 day prescription for Norco but she was advised to use MiraLax and stool softeners.  I will also prescribe the stool softeners.  She is in agreement with this plan.    The patient will be discharged to home with instructions to follow up with PCP and orthopedics in outpatient setting and given instructions to return to the emergency department if symptoms continue/worsen or if new symptoms arise; patient understands and agrees to the plan discussed.  Impression:    Clinical Impression   Arthritis of left hip (Primary)         Disposition:   Discharged        Part of this note may have been generated using voice recognition software.  Be advised, it is possible that the generated note may be prone to syntax and other dictation software errors.           [1] No Known Allergies

## 2023-09-29 ENCOUNTER — Telehealth (HOSPITAL_COMMUNITY): Payer: Self-pay | Admitting: FAMILY PRACTICE

## 2023-09-29 NOTE — Progress Notes (Signed)
 Post Ed Follow-Up    Post ED Follow-Up:   Document completed and/or attempted interactive contact(s) after transition to home after emergency department stay.:   Transition Facility and relevant Date:   Discharge Date: 09/28/23  Discharge from [facility] Emergency Department?: Yes  Discharge Facility: [facility]  Contacted by: Arlean Gibes, RN  Contact method: Patient/Caregiver Telephone, MyChart Patient Portal  Contact first attempt: 09/29/2023 12:17 PM  Contact second attempt: 09/29/2023 12:19 PM  MyChart message sent?: Yes  Medications prescribed: Yes  Follow Up Visit: No answer - left message

## 2023-09-29 NOTE — Telephone Encounter (Signed)
 Pt. MPOA , MJ Danney [phone]) called stated pt was seen in ER and is in lots of pain. ER doc only prescribed 3 days worth of pain meds. She is asking if you could prescribe more until she can make an appt. To get back in to see you.   They gave her Norco 10 mg.

## 2023-09-30 ENCOUNTER — Telehealth (HOSPITAL_BASED_OUTPATIENT_CLINIC_OR_DEPARTMENT_OTHER): Payer: Self-pay | Admitting: FAMILY PRACTICE

## 2023-09-30 NOTE — Telephone Encounter (Signed)
 Saw Dr. Burt had done x-ray and her hip is bone on bone pt wanted to know if she could get a script for Norco 10 mg. For at least 2 weeks till see Dr. Burt, He gave 3 day script only

## 2023-10-01 ENCOUNTER — Other Ambulatory Visit: Payer: Self-pay

## 2023-10-01 ENCOUNTER — Encounter (HOSPITAL_BASED_OUTPATIENT_CLINIC_OR_DEPARTMENT_OTHER): Payer: Self-pay | Admitting: FAMILY PRACTICE

## 2023-10-01 ENCOUNTER — Ambulatory Visit: Attending: FAMILY PRACTICE | Admitting: FAMILY PRACTICE

## 2023-10-01 ENCOUNTER — Encounter (INDEPENDENT_AMBULATORY_CARE_PROVIDER_SITE_OTHER): Payer: Self-pay | Admitting: INFECTIOUS DISEASE

## 2023-10-01 VITALS — BP 159/81 | HR 65 | Temp 98.3°F | Ht 59.0 in | Wt 137.0 lb

## 2023-10-01 DIAGNOSIS — M009 Pyogenic arthritis, unspecified: Secondary | ICD-10-CM | POA: Insufficient documentation

## 2023-10-01 DIAGNOSIS — M1612 Unilateral primary osteoarthritis, left hip: Secondary | ICD-10-CM | POA: Insufficient documentation

## 2023-10-01 MED ORDER — HYDROCODONE 10 MG-ACETAMINOPHEN 325 MG TABLET
1.0000 | ORAL_TABLET | Freq: Four times a day (QID) | ORAL | 0 refills | Status: DC | PRN
Start: 2023-10-01 — End: 2023-10-27

## 2023-10-01 NOTE — Progress Notes (Signed)
 FAMILY MEDICINE, [facility]  [address]  [place] [address]  Operated by [facility]  History and Physical    Name: Ana Kennedy MRN:  [id number]   Date: 10/01/2023 DOB:  1935-03-16 (88 y.o.)            Discussion:    Assessment/Plan:  Ana Kennedy was seen today for  pain.    Diagnoses and all orders for this visit:    Primary osteoarthritis of left hip    Chronic infection of right knee (CMS HCC)    Arthritis of left hip  -     HYDROcodone -acetaminophen  (NORCO) 10-325 mg Oral Tablet; Take 1 Tablet by mouth Every 6 hours as needed for Pain for up to 30 days       Medications Discontinued During This Encounter   Medication Reason    HYDROcodone -acetaminophen  (NORCO) 10-325 mg Oral Tablet Reorder         Reason for Visit:  Pain (Severe pain in her hip. Patient is having Leaking from knee )    History of Present Illness  Ana Kennedy is a 88 y.o. female who is being seen today for   Chief Complaint   Patient presents with     Pain     Severe pain in her hip. Patient is having Leaking from knee         Left hip has been hurting, was seen at ortho and they did steroid injection which didn't help at all  [facility] to [facility] ER 09/28/23 and they gave her dilaudid  in ER and then was discharged on hydrocodone  10/325 which has been helping her pain    Also has infected right knee but pain isn't too bad in that area    Past Medical History:   Diagnosis Date    A-fib     Acrodermatitis continua of [place]     Cardiomyopathy, dilated (CMS HCC)     CHF (congestive heart failure)     Colon polyps     Esophageal reflux     H/O sepsis     Hypothyroidism     LBBB (left bundle branch block)     Mild cognitive disorder     Mild congestive heart failure (CMS HCC)     MSSA bacteremia     Narcolepsy     Psoriatic arthritis (CMS HCC)     Sepsis     Sjogren's syndrome       Past Surgical History:   Procedure Laterality Date    COLONOSCOPY      ESOPHAGOGASTRODUODENOSCOPY      HIP  ARTHROPLASTY Bilateral     HX APPENDECTOMY      HX CATARACT REMOVAL      HX CHOLECYSTECTOMY      HX HERNIA REPAIR      umbilical    HX HYSTERECTOMY      HX KNEE SURGERY Bilateral     HX LUMBAR FUSION      HX PACEMAKER INSERTION      HX TONSILLECTOMY           Family Medical History:       Problem Relation (Age of Onset)    Arthritis-osteo Father    Breast Cancer Sister, Maternal Aunt    Colon Cancer Father    Diabetes Father    Heart Disease Mother, Father    Osteoporosis Other            Social History[1]    Review  of Systems  Review of Systems   Constitutional:  Negative for chills and fever.   Respiratory:  Negative for shortness of breath.    Cardiovascular:  Negative for chest pain.        Medication:  cephalexin  (KEFLEX ) 500 mg Oral Capsule, Take 1 Capsule (500 mg total) by mouth Four times a day for 30 days  docusate sodium  (COLACE) 50 mg Oral Capsule, Take 1 Capsule (50 mg total) by mouth Twice daily  ENTRESTO  24-26 mg Oral Tablet, Take 1 Tablet by mouth Twice daily  levothyroxine  (SYNTHROID ) 137 mcg Oral Tablet, Take 1 Tablet (137 mcg total) by mouth Every morning for 180 days  metoprolol  succinate (TOPROL -XL) 25 mg Oral Tablet Sustained Release 24 hr, Take 0.5 Tablets (12.5 mg total) by mouth Daily  HYDROcodone -acetaminophen  (NORCO) 10-325 mg Oral Tablet, Take 1 Tablet by mouth Every 6 hours as needed for Pain for up to 3 days    No facility-administered medications prior to visit.    Allergies:Allergies[2]    Physical Exam:  Vitals:    10/01/23 1318 10/01/23 1323   BP: (!) 152/68 (!) 159/81   Pulse: 69 65   Temp: 36.8 C (98.3 F)    SpO2: 97%    Weight: 62.1 kg (137 lb)    Height: 1.499 m (4' 11)    BMI: 27.67       Physical Exam  Constitutional:       General: She is not in acute distress.     Appearance: Normal appearance.   Cardiovascular:      Rate and Rhythm: Normal rate and regular rhythm.      Heart sounds: No murmur heard.  Pulmonary:      Breath sounds: Normal breath sounds. No wheezing,  rhonchi or rales.   Neurological:      Mental Status: She is oriented to person, place, and time.              Reviewed family history, medication history, social history, and allergies with the patient today.   Follow up: Return in about 1 month (around 10/31/2023).  Seek medical attention for new or worsening symptoms.  Medication list was confirmed with the patient and changes reflected in the medical record.  Discussed potential side effects as well as requirements of monitoring for any medications that were changed or added today.    [name], MD          This note was partially created using MModal Fluency Direct system (voice recognition software) and is inherently subject to errors including those of syntax and sound-alike substitutions which may escape proofreading.  In such instances, original meaning may be extrapolated by contextual derivation.       [1]   Social History  Tobacco Use    Smoking status: Never    Smokeless tobacco: Never   Vaping Use    Vaping status: Never Used   Substance Use Topics    Alcohol use: Not Currently    Drug use: Never   [2] No Known Allergies

## 2023-10-02 MED ORDER — CEPHALEXIN 500 MG CAPSULE
500.0000 mg | ORAL_CAPSULE | Freq: Four times a day (QID) | ORAL | 3 refills | Status: DC
Start: 2023-10-02 — End: 2023-11-12

## 2023-10-02 NOTE — Addendum Note (Signed)
 Addended by: Jozey Janco on: 10/02/2023 03:22 PM     Modules accepted: Orders

## 2023-10-13 ENCOUNTER — Encounter (HOSPITAL_BASED_OUTPATIENT_CLINIC_OR_DEPARTMENT_OTHER): Payer: Self-pay | Admitting: FAMILY PRACTICE

## 2023-10-14 ENCOUNTER — Encounter (INDEPENDENT_AMBULATORY_CARE_PROVIDER_SITE_OTHER): Payer: Self-pay

## 2023-10-14 ENCOUNTER — Other Ambulatory Visit: Payer: Self-pay

## 2023-10-14 ENCOUNTER — Ambulatory Visit: Payer: Self-pay

## 2023-10-14 ENCOUNTER — Other Ambulatory Visit (INDEPENDENT_AMBULATORY_CARE_PROVIDER_SITE_OTHER): Payer: Self-pay | Admitting: INTERVENTIONAL CARDIOLOGY

## 2023-10-14 VITALS — BP 127/59 | HR 80 | Temp 97.8°F | Resp 16 | Ht 59.0 in | Wt 133.0 lb

## 2023-10-14 DIAGNOSIS — I428 Other cardiomyopathies: Secondary | ICD-10-CM | POA: Insufficient documentation

## 2023-10-14 DIAGNOSIS — Z01818 Encounter for other preprocedural examination: Secondary | ICD-10-CM | POA: Insufficient documentation

## 2023-10-14 DIAGNOSIS — I5042 Chronic combined systolic (congestive) and diastolic (congestive) heart failure: Secondary | ICD-10-CM | POA: Insufficient documentation

## 2023-10-14 DIAGNOSIS — I10 Essential (primary) hypertension: Secondary | ICD-10-CM | POA: Insufficient documentation

## 2023-10-14 DIAGNOSIS — F09 Unspecified mental disorder due to known physiological condition: Secondary | ICD-10-CM | POA: Insufficient documentation

## 2023-10-14 DIAGNOSIS — Z95810 Presence of automatic (implantable) cardiac defibrillator: Secondary | ICD-10-CM | POA: Insufficient documentation

## 2023-10-14 MED ORDER — ENTRESTO 24 MG-26 MG TABLET
1.0000 | ORAL_TABLET | Freq: Two times a day (BID) | ORAL | 3 refills | Status: AC
Start: 2023-10-14 — End: 2024-10-13

## 2023-10-14 NOTE — Progress Notes (Signed)
 Cardiology [facility], Medical Office Building [place]  [address]  [place] [address]  [phone]    Cardiology  Clinic Note    Name: Ana Kennedy   DOB: Jul 25, 1935  [88 y.o. female]   MRN: [id number]       Visit Date: 10/14/2023   Referring: No referring provider defined for this encounter.   PCP: [name], MD         Chief Complaint: Follow Up 6 Months      History of Present Illness   Ana Kennedy is a 88 y.o. White female who presents for follow up. She is a patient of Dr. Jodean.      She is accompanied by her daughter in the office today.  Patient had previously lived in [place]  and had her cardiac care there.      She has a implantable defibrillator per her report. She is now following with Dr. Dewain at [facility].   There was previous documentation that she had had a device removed after it became infected and when it was reimplanted she had a  BIV pacemaker.      Patient appears somewhat forgetful about her medical history. Her daughter states today that she has dementia.      She denies any dyspnea, palpitations, edema or syncope. She denies any exertional chest pain or discomfort.      Patient in the multiple episodes of sepsis and currently follows with Infectious Disease in is taking antibiotics daily.    She states she has been having increased pain secondary to left hip. She is in need of hip replacement.     Patient Active Problem List    Diagnosis Date Noted    Primary osteoarthritis of left hip 10/01/2023    Chronic infection of right knee (CMS HCC) 07/02/2023    Knee swelling 07/02/2023    Effusion of right knee joint 07/02/2023    Lumbago 03/10/2023    Sjogren syndrome 03/10/2023    Hypothyroidism     Mild cognitive disorder     NICM (nonischemic cardiomyopathy) (CMS HCC) 12/21/2021    Chronic combined systolic and diastolic CHF, NYHA class 2 (CMS HCC) 12/21/2021    DOE (dyspnea on exertion) 12/21/2021    Automatic implantable cardiac  defibrillator in situ 12/21/2021    Ventricular tachycardia (paroxysmal) 12/21/2021    PAF (paroxysmal atrial fibrillation) 12/21/2021    Essential hypertension 12/21/2021       Allergies  Allergies[1]    Medications  Current Medications[2]    History  Past Medical History:   Diagnosis Date    A-fib     Acrodermatitis continua of [place]     Cardiomyopathy, dilated (CMS HCC)     CHF (congestive heart failure)     Colon polyps     Esophageal reflux     H/O sepsis     Hypothyroidism     LBBB (left bundle branch block)     Mild cognitive disorder     Mild congestive heart failure (CMS HCC)     MSSA bacteremia     Narcolepsy     Psoriatic arthritis (CMS HCC)     Sepsis     Sjogren's syndrome          Past Surgical History:   Procedure Laterality Date    COLONOSCOPY      ESOPHAGOGASTRODUODENOSCOPY      HIP ARTHROPLASTY Bilateral     HX APPENDECTOMY  HX CATARACT REMOVAL      HX CHOLECYSTECTOMY      HX HERNIA REPAIR      umbilical    HX HYSTERECTOMY      HX KNEE SURGERY Bilateral     HX LUMBAR FUSION      HX PACEMAKER INSERTION      HX TONSILLECTOMY       Social History     Socioeconomic History    Marital status: Widowed   Tobacco Use    Smoking status: Never    Smokeless tobacco: Never   Vaping Use    Vaping status: Never Used   Substance and Sexual Activity    Alcohol use: Not Currently    Drug use: Never     Social Determinants of Health     Financial Resource Strain: Low Risk (09/11/2022)    Financial Resource Strain     SDOH Financial: No   Transportation Needs: Low Risk (09/11/2022)    Transportation Needs     SDOH Transportation: No   Social Connections: Low Risk (07/03/2023)    Social Connections     SDOH Social Isolation: 5 or more times a week   Intimate Partner Violence: High Risk (09/11/2022)    Intimate Partner Violence     SDOH Domestic Violence: No   Housing Stability: Low Risk (09/11/2022)    Housing Stability     SDOH Housing Situation: I have housing.     SDOH Housing Worry: No     Family Medical History:        Problem Relation (Age of Onset)    Arthritis-osteo Father    Breast Cancer Sister, Maternal Aunt    Colon Cancer Father    Diabetes Father    Heart Disease Mother, Father    Osteoporosis Other              Review of Systems:  General: Denies fever or chills. No fatigue.   CNS: Denies syncope or dizziness. No headache.  Eyes: Denies recent visual changes. No pain.  Ears, Nose, and Throat: Denies ear pain, nasal congestion, or sore throat.  Endocrine: Denies excessive hunger or thirst. No heat/cold intolerance.  Respiratory: Denies shortness of breath. No cough.  Cardiovascular: Per HPI Denies claudication or varicose veins.  GI: Denies nausea/vomiting. No black/bloody bowel movements.  GU: Denies hematuria or dysuria. No incontinence.  Musculoskeletal: left hip pain.   Skin: No rashes, hives, or eczema.  Psychiatric: Denies anxiety, depression, or insomnia.  Hematologic: No bleedings disorders or swollen nodes.    All other systems reviewed and either negative or not pertinent.     Physical Examination:  BP (!) 127/59 (Site: Left Arm, Patient Position: Sitting, Cuff Size: Adult)   Pulse 80   Temp 36.6 C (97.8 F)   Resp 16   Ht 1.499 m (4' 11)   Wt 60.3 kg (133 lb)   SpO2 97%   BMI 26.86 kg/m       General: Awake. Well nourished. No acute distress. A&O x 3.  HEENT: Normocephalic, atraumatic. PERRL. Normal hearing.  Neck: No JVD or bruit. Trachea midline.  Lungs: Clear to auscultation. No rhonchi or wheezing noted. Unlabored.  Heart: Regular rate and rhythm. No murmur, rub, or gallop.  Abdomen: BS normal x 4 quadrants. Soft, non-tender, non-distended.  Extremities: No clubbing or cyanosis. No edema.  Psychiatric: Cooperative, appropriate mood and affect.  Musculoskeletal: Strength grips are equal. No red, swollen joints. MAE.  Skin: Warm, dry, and intact. No rashes  or hives are noted.       Diagnostics  ECG:  To be reviewed by Dr Jodean      Orders Placed This Encounter    ECG 12 Lead w/ Interp (MUSE  -  In Clinic, Same Day)       There are no discontinued medications.    Assessment and Plan:  Assessment/Plan   1. Essential hypertension    2. Chronic combined systolic and diastolic CHF, NYHA class 2 (CMS HCC)    3. NICM (nonischemic cardiomyopathy) (CMS HCC)    4. Automatic implantable cardiac defibrillator in situ    5. Mild cognitive disorder    6. Preoperative examination        PLAN:   Patient is in need of cardiac risk stratification for left hip replacement. She denies any increased anginal symptoms. Will check an echocardiogram to assess her LV EF and valvular function to complete cardiac risk stratification.  She is on appropriate GDMT for history of NICM. She has no s/s of acute exacerbation. She is following with [facility] for management of her AICD.     RCRI Class 1 point risk, 1.1% risk of major cardiac event.      With an RCRI >1, we recommend checking baseline BNP. If >300, we recommend EKG to be ordered in the PACU and troponins daily for 48-72 hours post procedure. If BNP <300, no routine post op EKGs or troponins are necessary unless the patient develops issues that would warrant this work up.     Minor surgical and dental procedures usually do not require cessation of antiplatelet therapy. Please consider if this patient's antiplatelets must be stopped or if it is possible to continue. If it should be determined that the patient's medications (antiplatelet or anticoagulation) need to be held, it may be done so with reasonably low risk at the direction and discretion of the surgeon. Please hold medication for the minimal amount of time possible, and resume as soon as it is felt safe from a surgical standpoint.     Please note: Patients who require emergent or urgent surgery are at an increased risk of a perioperative cardiovascular event at any level of baseline risk. In many cases, the patients need for surgery would not allow sufficient time for an extensive evaluation of the patient's  cardiovascular issues, and the benefit of proceeding with surgery outweighs the risk of delaying surgery for additional testing.       Follow up:  Return in about 6 months (around 04/13/2024).      The patient was given the opportunity to ask questions and those questions were answered to the patient's satisfaction. The patient was encouraged to call with any additional questions or concerns. Discussed with the effects and side effects of medications. Medication safety was discussed. The patient was informed to contact the office within 7 business days if a message/lab results/referral/imaging results have not been conveyed to the patient.       Aldona Lab, APRN, CNP  [facility]  Cardiology  [facility]    A portion of this documentation may have been generated using [facility] voice recognition software and may contain syntax/voice recognition errors.              [1] No Known Allergies  [2]   Current Outpatient Medications:     cephalexin  (KEFLEX ) 500 mg Oral Capsule, Take 1 Capsule (500 mg total) by mouth Four times a day for 30 days, Disp: 120 Capsule, Rfl:  3    docusate sodium  (COLACE) 50 mg Oral Capsule, Take 1 Capsule (50 mg total) by mouth Twice daily, Disp: , Rfl:     ENTRESTO  24-26 mg Oral Tablet, Take 1 Tablet by mouth Twice daily, Disp: 180 Tablet, Rfl: 1    HYDROcodone -acetaminophen  (NORCO) 10-325 mg Oral Tablet, Take 1 Tablet by mouth Every 6 hours as needed for Pain for up to 30 days, Disp: 90 Tablet, Rfl: 0    levothyroxine  (SYNTHROID ) 137 mcg Oral Tablet, Take 1 Tablet (137 mcg total) by mouth Every morning for 180 days, Disp: 90 Tablet, Rfl: 1    metoprolol  succinate (TOPROL -XL) 25 mg Oral Tablet Sustained Release 24 hr, Take 0.5 Tablets (12.5 mg total) by mouth Daily, Disp: 45 Tablet, Rfl: 4    rifAMPin  (RIFADIN ) 300 mg Oral Capsule, Take 1 Capsule (300 mg total) by mouth, Disp: , Rfl:

## 2023-10-15 ENCOUNTER — Ambulatory Visit
Admission: RE | Admit: 2023-10-15 | Discharge: 2023-10-15 | Disposition: A | Discharge status: Home / Self Care | Payer: Self-pay | Source: Ambulatory Visit

## 2023-10-15 DIAGNOSIS — Z95810 Presence of automatic (implantable) cardiac defibrillator: Secondary | ICD-10-CM | POA: Insufficient documentation

## 2023-10-15 DIAGNOSIS — F09 Unspecified mental disorder due to known physiological condition: Secondary | ICD-10-CM | POA: Insufficient documentation

## 2023-10-15 DIAGNOSIS — I428 Other cardiomyopathies: Secondary | ICD-10-CM | POA: Insufficient documentation

## 2023-10-15 MED ORDER — SULFUR HEXAFLUORIDE MICROSPHERES 25 MG INTRAVENOUS SUSPENSION
2.0000 mL | INHALATION_SUSPENSION | INTRAVENOUS | Status: AC
Start: 2023-10-15 — End: 2023-10-15
  Administered 2023-10-15: 2 mL via INTRAVENOUS

## 2023-10-16 DIAGNOSIS — I080 Rheumatic disorders of both mitral and aortic valves: Secondary | ICD-10-CM

## 2023-10-16 DIAGNOSIS — F09 Unspecified mental disorder due to known physiological condition: Secondary | ICD-10-CM

## 2023-10-16 DIAGNOSIS — Z95810 Presence of automatic (implantable) cardiac defibrillator: Secondary | ICD-10-CM

## 2023-10-16 LAB — TRANSTHORACIC ECHOCARDIOGRAM - ADULT
EF VISUAL ESTIMATE: 30
EF: 35

## 2023-10-17 DIAGNOSIS — I498 Other specified cardiac arrhythmias: Secondary | ICD-10-CM

## 2023-10-17 DIAGNOSIS — R9431 Abnormal electrocardiogram [ECG] [EKG]: Secondary | ICD-10-CM

## 2023-10-17 DIAGNOSIS — Z950 Presence of cardiac pacemaker: Secondary | ICD-10-CM

## 2023-10-17 LAB — ECG 12 LEAD W/ INTERP (AMB USE ONLY) (MUSE, IN CLINC) (93005/93010)
Atrial Rate: 79 {beats}/min
Calculated P Axis: 88 degrees
Calculated R Axis: -51 degrees
Calculated T Axis: 33 degrees
PR Interval: 166 ms
QRS Duration: 126 ms
QT Interval: 436 ms
QTC Calculation: 499 ms
Ventricular rate: 79 {beats}/min

## 2023-10-20 ENCOUNTER — Encounter (HOSPITAL_BASED_OUTPATIENT_CLINIC_OR_DEPARTMENT_OTHER): Payer: Self-pay | Admitting: FAMILY PRACTICE

## 2023-10-20 NOTE — Nursing Note (Signed)
 Plan of Care- 09/08/23-11/06/23  [facility]   Scanned, Billed, Faxed  ADH

## 2023-10-21 ENCOUNTER — Encounter (HOSPITAL_BASED_OUTPATIENT_CLINIC_OR_DEPARTMENT_OTHER): Payer: Self-pay | Admitting: FAMILY PRACTICE

## 2023-10-21 ENCOUNTER — Ambulatory Visit: Admitting: INFECTIOUS DISEASE

## 2023-10-21 ENCOUNTER — Other Ambulatory Visit: Payer: Self-pay

## 2023-10-27 ENCOUNTER — Encounter (HOSPITAL_BASED_OUTPATIENT_CLINIC_OR_DEPARTMENT_OTHER): Payer: Self-pay | Admitting: FAMILY PRACTICE

## 2023-10-27 ENCOUNTER — Ambulatory Visit: Payer: Self-pay | Attending: FAMILY PRACTICE | Admitting: FAMILY PRACTICE

## 2023-10-27 ENCOUNTER — Other Ambulatory Visit: Payer: Self-pay

## 2023-10-27 VITALS — BP 148/67 | HR 76 | Temp 98.2°F | Ht 59.0 in | Wt 133.4 lb

## 2023-10-27 DIAGNOSIS — M009 Pyogenic arthritis, unspecified: Secondary | ICD-10-CM | POA: Insufficient documentation

## 2023-10-27 DIAGNOSIS — M545 Low back pain, unspecified: Secondary | ICD-10-CM | POA: Insufficient documentation

## 2023-10-27 DIAGNOSIS — M1612 Unilateral primary osteoarthritis, left hip: Secondary | ICD-10-CM | POA: Insufficient documentation

## 2023-10-27 MED ORDER — HYDROCODONE 10 MG-ACETAMINOPHEN 325 MG TABLET
1.0000 | ORAL_TABLET | Freq: Four times a day (QID) | ORAL | 0 refills | Status: DC | PRN
Start: 2023-10-27 — End: 2023-11-27

## 2023-10-27 NOTE — Progress Notes (Signed)
 [facility], [facility]  [address]  [place] [address]  Operated by [facility]  History and Physical    Name: Ana Kennedy MRN:  [id number]   Date: 10/27/2023 DOB:  03-05-1935 (88 y.o.)            Discussion:    Assessment/Plan:  [facility] was seen today for follow up.    Diagnoses and all orders for this visit:    Primary osteoarthritis of left hip    Chronic infection of right knee (CMS HCC)    Lumbago    Arthritis of left hip  -     HYDROcodone -acetaminophen  (NORCO) 10-325 mg Oral Tablet; Take 1 Tablet by mouth Every 6 hours as needed for Pain for up to 30 days       Medications Discontinued During This Encounter   Medication Reason    HYDROcodone -acetaminophen  (NORCO) 10-325 mg Oral Tablet Reorder         Reason for Visit: Follow Up    History of Present Illness  Ana Kennedy is a 88 y.o. female who is being seen today for   Chief Complaint   Patient presents with    Follow Up        (M16.12) Primary osteoarthritis of left hip  (primary encounter diagnosis)  (M00.9) Chronic infection of right knee (CMS HCC)  (M54.50) Lumbago  (M16.12) Arthritis of left hip  Plan: having a lot of pain, not tolerating throughout the day, wakes up in the middle of the night with a lot of pain  Is scheduled to see ortho about doing hip replacement       Past Medical History:   Diagnosis Date    A-fib     Acrodermatitis continua of [place]     Cardiomyopathy, dilated (CMS HCC)     CHF (congestive heart failure)     Colon polyps     Esophageal reflux     H/O sepsis     Hypothyroidism     LBBB (left bundle branch block)     Mild cognitive disorder     Mild congestive heart failure (CMS HCC)     MSSA bacteremia     Narcolepsy     Psoriatic arthritis (CMS HCC)     Sepsis     Sjogren's syndrome       Past Surgical History:   Procedure Laterality Date    COLONOSCOPY      ESOPHAGOGASTRODUODENOSCOPY      HIP ARTHROPLASTY Bilateral     HX APPENDECTOMY      HX CATARACT REMOVAL       HX CHOLECYSTECTOMY      HX HERNIA REPAIR      umbilical    HX HYSTERECTOMY      HX KNEE SURGERY Bilateral     HX LUMBAR FUSION      HX PACEMAKER INSERTION      HX TONSILLECTOMY           Family Medical History:       Problem Relation (Age of Onset)    Arthritis-osteo Father    Breast Cancer Sister, Maternal Aunt    Colon Cancer Father    Diabetes Father    Heart Disease Mother, Father    Osteoporosis Other            Social History[1]    Review of Systems  Review of Systems   Constitutional:  Negative for chills and fever.   Respiratory:  Negative for shortness of breath.    Cardiovascular:  Negative for chest pain.        Medication:  cephalexin  (KEFLEX ) 500 mg Oral Capsule, Take 1 Capsule (500 mg total) by mouth Four times a day for 30 days  docusate sodium  (COLACE) 50 mg Oral Capsule, Take 1 Capsule (50 mg total) by mouth Twice daily (Patient not taking: Reported on 10/27/2023)  ENTRESTO  24-26 mg Oral Tablet, Take 1 Tablet by mouth Twice daily  levothyroxine  (SYNTHROID ) 137 mcg Oral Tablet, Take 1 Tablet (137 mcg total) by mouth Every morning for 180 days  metoprolol  succinate (TOPROL -XL) 25 mg Oral Tablet Sustained Release 24 hr, Take 0.5 Tablets (12.5 mg total) by mouth Daily  rifAMPin  (RIFADIN ) 300 mg Oral Capsule, Take 1 Capsule (300 mg total) by mouth (Patient taking differently: Take 1 Capsule (300 mg total) by mouth Twice daily)  HYDROcodone -acetaminophen  (NORCO) 10-325 mg Oral Tablet, Take 1 Tablet by mouth Every 6 hours as needed for Pain for up to 30 days    No facility-administered medications prior to visit.    Allergies:Allergies[2]    Physical Exam:  Vitals:    10/27/23 1511 10/27/23 1519   BP: (!) 149/69 (!) 148/67   Pulse: 81 76   Temp: 36.8 C (98.2 F)    SpO2: 96%    Weight: 60.5 kg (133 lb 6.4 oz)    Height: 1.499 m (4' 11)    BMI: 26.94       Physical Exam  Constitutional:       General: She is not in acute distress.     Appearance: Normal appearance.   Cardiovascular:      Rate and  Rhythm: Normal rate and regular rhythm.      Heart sounds: No murmur heard.  Pulmonary:      Breath sounds: Normal breath sounds. No wheezing, rhonchi or rales.   Neurological:      Mental Status: She is oriented to person, place, and time.              Reviewed family history, medication history, social history, and allergies with the patient today.   Follow up: Return in about 1 month (around 11/27/2023).  Seek medical attention for new or worsening symptoms.  Medication list was confirmed with the patient and changes reflected in the medical record.  Discussed potential side effects as well as requirements of monitoring for any medications that were changed or added today.    [name], MD          This note was partially created using MModal Fluency Direct system (voice recognition software) and is inherently subject to errors including those of syntax and sound-alike substitutions which may escape proofreading.  In such instances, original meaning may be extrapolated by contextual derivation.       [1]   Social History  Tobacco Use    Smoking status: Never    Smokeless tobacco: Never   Vaping Use    Vaping status: Never Used   Substance Use Topics    Alcohol use: Not Currently    Drug use: Never   [2] No Known Allergies

## 2023-10-30 ENCOUNTER — Ambulatory Visit: Payer: Self-pay | Attending: ORTHOPAEDIC SURGERY | Admitting: ORTHOPAEDIC SURGERY

## 2023-10-30 ENCOUNTER — Other Ambulatory Visit: Payer: Self-pay

## 2023-10-30 DIAGNOSIS — M009 Pyogenic arthritis, unspecified: Secondary | ICD-10-CM | POA: Insufficient documentation

## 2023-10-30 DIAGNOSIS — M4150 Other secondary scoliosis, site unspecified: Secondary | ICD-10-CM | POA: Insufficient documentation

## 2023-10-30 DIAGNOSIS — Z96651 Presence of right artificial knee joint: Secondary | ICD-10-CM

## 2023-10-30 DIAGNOSIS — T8453XD Infection and inflammatory reaction due to internal right knee prosthesis, subsequent encounter: Secondary | ICD-10-CM

## 2023-10-30 DIAGNOSIS — M25552 Pain in left hip: Secondary | ICD-10-CM

## 2023-10-30 DIAGNOSIS — G8929 Other chronic pain: Secondary | ICD-10-CM | POA: Insufficient documentation

## 2023-10-30 DIAGNOSIS — M160 Bilateral primary osteoarthritis of hip: Secondary | ICD-10-CM | POA: Insufficient documentation

## 2023-10-30 NOTE — Progress Notes (Signed)
 [facility], [facility]  [address]  [place] [address]  Operated by [facility]    Name: Ana Kennedy MRN:  [id number]   Date: 10/30/2023 DOB:  July 06, 1935 (88 y.o.)       Date of Appointment: 10/30/2023      History of Present Illness:    History of Present Illness  Ana Kennedy is an 88 year old female with chronic right knee infection and left hip arthritis who presents with severe left hip pain. She is accompanied by her daughter.    Severe left hip pain has been present since August, following an ineffective cortisone injection. The pain is located laterally and is severe enough to disrupt sleep, causing her to wake up at night crying. She takes opioids, 10 mg four times daily, for pain management. The pain intensified after physical therapy and is described as the worst she has ever experienced.    She has a chronic infection in her right knee with ongoing fluid leakage. The drainage has decreased, and her daughter changes the bandage every other day.        Physical Exam:  There were no vitals taken for this visit.      There is no height or weight on file to calculate BMI.   General:  No acute distress.  Alert and oriented.  Skin:  Warm, pink, no lesions, no edema.  Head:  Atraumatic, normocephalic.     Exam:    Physical Exam  MUSCULOSKELETAL: Right knee with chronic drainage, no effusion reduced swelling.  Tenderness around the left hip abductors.  No lateral tenderness no tenderness motion.  Obvious deformity scoliosis    Radiographic/Other Studies: reviewed    Results      X-rays independently reviewed by me from [facility] showing mild degenerative changes of the hips.  Assessment:  (G89.29) Other chronic pain  (primary encounter diagnosis)    (M41.50) Degenerative scoliosis    (M16.0) Osteoarthritis of hips, bilateral    (M00.9) Chronic infection of right knee (CMS HCC)      Plan:  The patients diagnosis and treatment options, both  non-surgical and surgical, were discussed at length.  Risks, benefits, alternatives and potential complications of these were discussed.  Findings of any radiographic studies or laboratory evaluation were discussed with the patient.  The  patient was given the opportunity to ask questions which were  then answered to the best of my ability.  The patient was informed of any follow up care or further studies needed and showed understanding of these.  These actions were discussed with the patient.    Assessment & Plan  Chronic right knee prosthetic joint infection  Chronic infection with persistent drainage. Drainage has reduced swelling.  - Change bandage every other day.  - Discuss surgical intervention only if systemic illness occurs.  - Follow up every three months.    Left hip pain (likely referred from lumbar spine)  Severe left hip pain likely referred from lumbar spine. No significant hip arthritis. Cortisone injection ineffective. Hip replacement not recommended due to infection risk and unlikely pain relief.  - Refer to pain management for evaluation and treatment, including potential nerve blocks or medications like Neurontin.  - Consider alternative pain management strategies to opioids.      -The patient was instructed to seek medical attention urgently for new or worsening symptoms.    -All treatment options were discussed with the patient.    -All of the patient's questions  were answered in the office today.    -Please call the office with any questions or concerns.           No orders of the defined types were placed in this encounter.         Donnice Broach, DO  10/30/2023, 13:42        This note was created with assistance from [employer] via capture of conversational audio. Consent was obtained from the patient and all parties present prior to recording.

## 2023-10-31 ENCOUNTER — Encounter (INDEPENDENT_AMBULATORY_CARE_PROVIDER_SITE_OTHER): Payer: Self-pay | Admitting: INFECTIOUS DISEASE

## 2023-10-31 ENCOUNTER — Other Ambulatory Visit (INDEPENDENT_AMBULATORY_CARE_PROVIDER_SITE_OTHER): Payer: Self-pay | Admitting: ORTHOPAEDIC SURGERY

## 2023-10-31 DIAGNOSIS — M160 Bilateral primary osteoarthritis of hip: Secondary | ICD-10-CM

## 2023-11-06 ENCOUNTER — Ambulatory Visit (INDEPENDENT_AMBULATORY_CARE_PROVIDER_SITE_OTHER): Admitting: INFECTIOUS DISEASE

## 2023-11-12 ENCOUNTER — Other Ambulatory Visit: Payer: Self-pay

## 2023-11-12 ENCOUNTER — Other Ambulatory Visit (HOSPITAL_BASED_OUTPATIENT_CLINIC_OR_DEPARTMENT_OTHER): Payer: Self-pay

## 2023-11-12 ENCOUNTER — Ambulatory Visit: Payer: Self-pay | Attending: PAIN MANAGEMENT | Admitting: PAIN MANAGEMENT

## 2023-11-12 ENCOUNTER — Encounter (HOSPITAL_BASED_OUTPATIENT_CLINIC_OR_DEPARTMENT_OTHER): Payer: Self-pay | Admitting: PAIN MANAGEMENT

## 2023-11-12 VITALS — BP 134/60 | HR 72 | Ht 59.0 in | Wt 131.0 lb

## 2023-11-12 DIAGNOSIS — M160 Bilateral primary osteoarthritis of hip: Secondary | ICD-10-CM | POA: Insufficient documentation

## 2023-11-12 DIAGNOSIS — M25559 Pain in unspecified hip: Secondary | ICD-10-CM | POA: Insufficient documentation

## 2023-11-12 DIAGNOSIS — M7062 Trochanteric bursitis, left hip: Secondary | ICD-10-CM | POA: Insufficient documentation

## 2023-11-12 DIAGNOSIS — M461 Sacroiliitis, not elsewhere classified: Secondary | ICD-10-CM | POA: Insufficient documentation

## 2023-11-12 DIAGNOSIS — G894 Chronic pain syndrome: Secondary | ICD-10-CM | POA: Insufficient documentation

## 2023-11-12 DIAGNOSIS — M791 Myalgia, unspecified site: Secondary | ICD-10-CM | POA: Insufficient documentation

## 2023-11-12 DIAGNOSIS — M47818 Spondylosis without myelopathy or radiculopathy, sacral and sacrococcygeal region: Secondary | ICD-10-CM | POA: Insufficient documentation

## 2023-11-12 DIAGNOSIS — M7070 Other bursitis of hip, unspecified hip: Secondary | ICD-10-CM | POA: Insufficient documentation

## 2023-11-12 MED ORDER — LIDOCAINE 5 % TOPICAL PATCH
1.0000 | MEDICATED_PATCH | Freq: Every day | CUTANEOUS | 2 refills | Status: AC
Start: 2023-11-12 — End: 2024-02-10

## 2023-11-12 NOTE — Telephone Encounter (Signed)
 RX approved and encounter closed

## 2023-11-12 NOTE — Nursing Note (Signed)
 New patient visit. Referred by Dr. Burt for Osteoarthritis of hips, bilateral.  Patient has been dealing with Left-sided low back and hip pain since July 2025. Denies injury.  Worst pain is in left hip.    Current pain rated: 9.5/10. Best: 0/10 (when taking Norcos). Worst: 10/10.    Left low back pain described as frequent excruciating painand 'shocking/electric pain. Extends into left hip/buttocks, and into left groin.  Eases with medication, frequent repositioning, sitting while leaning on right side, ice application, lidocaine  patches.  Aggravated by lying on left side, walking. Pain worst at night - wakes her up from sleep.  Denies numbness/tingling. Reports weakness to left hip/leg. Uses cane and walker to ambulate - denies falls.  Taking Norco 1-4x/day (PCP Rx) - providing good pain relief. Daughter reports patient has some memory issues so she leaves meds out for her so she doesn't always take them.    Injections/Surgeries:  History of steroid injection to left hip (08/2023) which did not help.   Dr. Burt did not recommend any hip surgeries.  Reports previous low back injections (believes steroid injections) to low back prior to lumbar surgery which did not help her pain.  History of lumbar surgery - possible fusion in 2018 ([place]). Has not been evaluated since this.    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 Will be on ABX for rest of life per daughter.    CT Lumbar Spine (05/16/22) and XRAYs Left hip (09/28/23) in EPIC for review.  Awaiting urine drug screen sample.  MRemolona, RN    CT LUMBAR SPINE WO IV CONTRAST    Narrative  Terryann Perusse    CT LUMBAR SPINE WO IV CONTRAST performed on 05/16/2022 10:45 AM.    INDICATION:  back pain, previous joint infection  Additional History:  low back pain for two days, nki    TECHNIQUE:  Unenhanced multiplanar CT of the lumbar spine.  Dose modulation, automated exposure control, and/or iterative reconstruction were used for dose  reduction.    COMPARISON:  None available.  ___________________________________  FINDINGS:    Severe lumbar dextroscoliotic deformity.  Severe multilevel degenerative disc disease and degenerative facet arthropathy is especially severe from L2 to L5.  There is multilevel spinal canal stenosis related to spurring which is especially severe at L3-4 and moderate to severe at L4-5. It is moderate at L2-3. No demonstrable findings which suggest spinal infection.    ___________________________________    Impression  1. Very severe degenerative lumbar spondylosis with dextroscoliosis and multilevel spinal canal stenoses. Stenosis is especially severe at L3-4.        Radiologist location ID: [id number]    XR HIP LEFT W PELVIS 2-3 VIEWS, 09/28/2023 1:40 PM. INDICATION: pain Additional History: Worsening left hip pain x 2 weeks, NKI TECHNIQUE: AP pelvis; 2 views left hip. 2 views/5 images submitted for interpretation. COMPARISON: Outside study of 08/29/2023. ___________________________________ FINDINGS: PELVIS: There is normal configuration of the bones of the pelvis without evidence of fracture or dislocation. There is narrowing of both hip joints, right greater than left. No proximal femoral fracture is visualized. There are marked degenerative changes of the inferior lumbar spine. LEFT HIP: There is narrowing of the left hip joint with femoral and acetabular marginal osteophytes. No acute fracture or dislocation is seen. Limited examination of the visualized pelvis is unremarkable. ___________________________________ IMPRESSION: Severe degenerative changes of the right hip, moderate degenerative changes of the left hip, and marked degenerative changes of the inferior lumbar  spine. No acute fracture        Oswestry Low Back Pain Disability Questionnaire  Oswestry Disability Index    Please complete this questionnaire.  It is designed to tell us  how your back pain affects your ability to function in every day life.    Have  you had Chronic Pain or pain that has bothered you for 3 months or more:  I have Chronic Pain or pain that has bothered me for 3 months or more: Yes    Questionnaire filled out:        Section 1: PAIN INTENSITY  Pain Intensity : 2- The pain is moderate at the moment.    Section 2: PERSONAL CARE  Personal Care: 3- I need some help but manage most of my personal care.    Section 3: LIFTING  Lifting: 3- Pain prevents me from lifting heavy weights, but I can manage light to medium weights if they are conveniently positioned.    Section 4: WALKING  Walking: 4- I can only walk using a stick or crutches.    Section 5: SITTING  Sitting: 1- I can only sit in my favorite chair as long as I like.    Section 6: STANDING  Standing: 3- Pain prevents me from standing for more than 30 minutes.    Section 7: SLEEPING  Sleeping: 2- Because of pain I have less than 6 hours sleep.    Section 8: SOCIAL LIFE  Social Life: 3- Pain has restricted my social life and I do not go out as often.    Section 9: TRAVELING  Traveling: 3- Pain restricts me to journeys of less than 1 hour.    Section 10: EMPLOYMENT/HOMEMAKING  Employment/Homemaking: 3- Pain prevents me from doing anything but light duties.        TOTAL SCORE FROM ALL SECTIONS  ODI Total Score Oswestry LBP: 24    Disability Percentage:  ODI %: 48 %        INTERPRETATION:    ODI Scoring:  0% to 20% (minimal disability): Patients can cope with most activities of daily living.  No treatment may be indicated except for suggestions on lifting, posture, physical fitness and diet.  Patients with sedentary occupations (ex. Secretaries) may experience more problems than others.    21% to 40% (moderate disability):  Patients ma experience more pain and problems with sitting, lifting, and standing.  Travel and social life are more difficult.  Patients may be off work.  Personal care, sleeping and sexual activity may not be grossly affected. Conservative treatment may be sufficient.    41% to  60% (severe disability):  Pain is a primary problem for these patients, but they may also be experiencing significant problems in travel, personal care, social life, sexual activity and sleep.  A detailed evaluation is appropriate.  61% to 80% (crippled):  Back pain has an impact on all aspects of daily living and work.  Active treatment is required.    81%-100% :  These patients may be bed bound or exaggerating their symptoms.  Careful evaluation is recommended.  ,       11/12/2023   Conservative Therapies   Type of Injury None   Has the patient participated in Physical Therapy? Yes   Dates of physical therapy 2025   Actively participating in physical therapy No   Any relief with physical therapy No   Physical therapy comments Made pain worse   Has the patient participated in Chiropractic Manipulation? No  Has the patient participated in Home Exercise? Yes   Dates of home exercise Daily   Actively participating in home exercise Yes   Any relief with home exercise No   Home exercise comments Daily activity/walks as tolerated   Additional Previous Treatments Other   Explain other previous treatments Left hip steroid injection (20250, Low back injections (2018)   Previous Medications Narcotics;Pain Patch   Surgical Eval Yes     ,       11/12/2023     8:14 AM   Opioid Risk Assessment   Family History Illegal Drug Abuse 0   Family History of Prescription Drug Abuse 0   Personal History of Illegal Drug Use 0   Personal History Prescription Drug Abuse 0   Age 49-18 years old 0   Depression Diagnosis 1   Total Score 1

## 2023-11-12 NOTE — H&P (Unsigned)
 PAIN MANAGEMENT, [facility]  [address]  [place] [address]  Operated by [facility]  History and Physical    Name: Ana Kennedy MRN:  [id number]   Date: 11/12/2023 DOB:  [birth year] (88 y.o.)               Provider: Evalene JONELLE Roche, MD  PCP: [name], MD  Referring Provider: Donnice Broach     Reason for visit: New Patient (Left-sided low back and hip pain)      Objective:  Nursing Notes:   Leonce Browning, RN  11/12/23 9180  Signed  New patient visit. Referred by Dr. Broach for Osteoarthritis of hips, bilateral.  Patient has been dealing with Left-sided low back and hip pain since July 2025. Denies injury.  Worst pain is in left hip.    Current pain rated: 9.5/10. Best: 0/10 (when taking Norcos). Worst: 10/10.    Left low back pain described as frequent excruciating painand 'shocking/electric pain. Extends into left hip/buttocks, and into left groin.  Eases with medication, frequent repositioning, sitting while leaning on right side, ice application, lidocaine  patches.  Aggravated by lying on left side, walking. Pain worst at night - wakes her up from sleep.  Denies numbness/tingling. Reports weakness to left hip/leg. Uses cane and walker to ambulate - denies falls.  Taking Norco 1-4x/day (PCP Rx) - providing good pain relief. Daughter reports patient has some memory issues so she leaves meds out for her so she doesn't always take them.    Injections/Surgeries:  History of steroid injection to left hip (08/2023) which did not help.   Dr. Broach did not recommend any hip surgeries.  Reports previous low back injections (believes steroid injections) to low back prior to lumbar surgery which did not help her pain.  History of lumbar surgery - possible fusion in 2018 ([facility]). Has not been evaluated since this.    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 Will be on ABX for rest of life per daughter.    CT Lumbar Spine  (05/16/22) and XRAYs Left hip (09/28/23) in EPIC for review.  Awaiting urine drug screen sample.  MRemolona, RN    CT LUMBAR SPINE WO IV CONTRAST    Narrative  Ana Kennedy    CT LUMBAR SPINE WO IV CONTRAST performed on 05/16/2022 10:45 AM.    INDICATION:  back pain, previous joint infection  Additional History:  low back pain for two days, nki    TECHNIQUE:  Unenhanced multiplanar CT of the lumbar spine.  Dose modulation, automated exposure control, and/or iterative reconstruction were used for dose reduction.    COMPARISON:  None available.  ___________________________________  FINDINGS:    Severe lumbar dextroscoliotic deformity.  Severe multilevel degenerative disc disease and degenerative facet arthropathy is especially severe from L2 to L5.  There is multilevel spinal canal stenosis related to spurring which is especially severe at L3-4 and moderate to severe at L4-5. It is moderate at L2-3. No demonstrable findings which suggest spinal infection.    ___________________________________    Impression  1. Very severe degenerative lumbar spondylosis with dextroscoliosis and multilevel spinal canal stenoses. Stenosis is especially severe at L3-4.        Radiologist location ID: [id number]    XR HIP LEFT W PELVIS 2-3 VIEWS, 09/28/2023 1:40 PM. INDICATION: pain Additional History: Worsening left hip pain x 2 weeks, NKI TECHNIQUE: AP pelvis; 2 views left hip. 2 views/5  images submitted for interpretation. COMPARISON: Outside study of 08/29/2023. ___________________________________ FINDINGS: PELVIS: There is normal configuration of the bones of the pelvis without evidence of fracture or dislocation. There is narrowing of both hip joints, right greater than left. No proximal femoral fracture is visualized. There are marked degenerative changes of the inferior lumbar spine. LEFT HIP: There is narrowing of the left hip joint with femoral and acetabular marginal osteophytes. No acute fracture or dislocation is seen. Limited  examination of the visualized pelvis is unremarkable. ___________________________________ IMPRESSION: Severe degenerative changes of the right hip, moderate degenerative changes of the left hip, and marked degenerative changes of the inferior lumbar spine. No acute fracture        Oswestry Low Back Pain Disability Questionnaire  Oswestry Disability Index    Please complete this questionnaire.  It is designed to tell us  how your back pain affects your ability to function in every day life.    Have you had Chronic Pain or pain that has bothered you for 3 months or more:  I have Chronic Pain or pain that has bothered me for 3 months or more: Yes    Questionnaire filled out:        Section 1: PAIN INTENSITY  Pain Intensity : 2- The pain is moderate at the moment.    Section 2: PERSONAL CARE  Personal Care: 3- I need some help but manage most of my personal care.    Section 3: LIFTING  Lifting: 3- Pain prevents me from lifting heavy weights, but I can manage light to medium weights if they are conveniently positioned.    Section 4: WALKING  Walking: 4- I can only walk using a stick or crutches.    Section 5: SITTING  Sitting: 1- I can only sit in my favorite chair as long as I like.    Section 6: STANDING  Standing: 3- Pain prevents me from standing for more than 30 minutes.    Section 7: SLEEPING  Sleeping: 2- Because of pain I have less than 6 hours sleep.    Section 8: SOCIAL LIFE  Social Life: 3- Pain has restricted my social life and I do not go out as often.    Section 9: TRAVELING  Traveling: 3- Pain restricts me to journeys of less than 1 hour.    Section 10: EMPLOYMENT/HOMEMAKING  Employment/Homemaking: 3- Pain prevents me from doing anything but light duties.        TOTAL SCORE FROM ALL SECTIONS  ODI Total Score Oswestry LBP: 24    Disability Percentage:  ODI %: 48 %        INTERPRETATION:    ODI Scoring:  0% to 20% (minimal disability): Patients can cope with most activities of daily living.  No treatment  may be indicated except for suggestions on lifting, posture, physical fitness and diet.  Patients with sedentary occupations (ex. Secretaries) may experience more problems than others.    21% to 40% (moderate disability):  Patients ma experience more pain and problems with sitting, lifting, and standing.  Travel and social life are more difficult.  Patients may be off work.  Personal care, sleeping and sexual activity may not be grossly affected. Conservative treatment may be sufficient.    41% to 60% (severe disability):  Pain is a primary problem for these patients, but they may also be experiencing significant problems in travel, personal care, social life, sexual activity and sleep.  A detailed evaluation is appropriate.  61% to 80% (crippled):  Back pain has an impact on all aspects of daily living and work.  Active treatment is required.    81%-100% :  These patients may be bed bound or exaggerating their symptoms.  Careful evaluation is recommended.  ,       11/12/2023   Conservative Therapies   Type of Injury None   Has the patient participated in Physical Therapy? Yes   Dates of physical therapy 2025   Actively participating in physical therapy No   Any relief with physical therapy No   Physical therapy comments Made pain worse   Has the patient participated in Chiropractic Manipulation? No   Has the patient participated in Home Exercise? Yes   Dates of home exercise Daily   Actively participating in home exercise Yes   Any relief with home exercise No   Home exercise comments Daily activity/walks as tolerated   Additional Previous Treatments Other   Explain other previous treatments Left hip steroid injection (20250, Low back injections (2018)   Previous Medications Narcotics;Pain Patch   Surgical Eval Yes     ,       11/12/2023     8:14 AM   Opioid Risk Assessment   Family History Illegal Drug Abuse 0   Family History of Prescription Drug Abuse 0   Personal History of Illegal Drug Use 0   Personal History  Prescription Drug Abuse 0   Age 21-24 years old 0   Depression Diagnosis 1   Total Score 1        HPI:  The patient presents today for a new patient evaluation, referred by Dr. Burt, with a primary complaint of left gluteal and hip pain.   Pain is likely multifactorial, with contributions from the sacroiliac joint and hip bursa, exacerbated by chronic knee pain, underlying scoliosis, and peripheral neuropathy.  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 As a result, she is prescribed lifelong suppressive antibiotic therapy with Cephalexin  and Rifampin     History of lumbar fusion in 2018 that did not provide pain relief   Evaluated by Dr. Burt and hip surgery was not recommended   Tried and failed left hip joint injection with Dr. Burt Quant and failed physical therapy. Patient reports this made her pain worse.   Patient reports attempting in home exercise program as pain tolerates    No prior history of Gabapentin use for neuropathy. If the patient is interested in medication management, recommend that the primary care provider consider initiating Gabapentin 100 mg nightly, given the potential for side effects such as drowsiness.    Patient using a walker today to aid in ambulation. Recommend continued use of a walker for increased stability due to being a fall risk.     CT LUMBAR SPINE WO IV CONTRAST 05/16/23 report reviewed   FINDINGS:  Severe lumbar dextroscoliotic deformity.  Severe multilevel  degenerative disc disease and degenerative facet arthropathy is especially severe from L2 to L5.  There  is multilevel spinal canal stenosis related to spurring which is especially severe at L3-4 and moderate to severe at L4-5. It is moderate at L2-3. No demonstrable findings which suggest spinal infection.    Order Physical Therapy to be completed at location patient chooses for gait and strength training    Left Sacroiliac Joint Injection x 1 with fluoroscopy guidance  - will order x 1 and if relief is positive - will repeat and then consider SI joint stabilization thereafter.  Discussed risks/benefits of procedure with the patient. All questions addressed    Hip Bursa Injection w/fluoroscopy guidance x 1  -will order x1 and if relief is positive can repeat   Discussed risks/benefits of procedure with the patient. All questions addressed    Order Lidocaine  5% patches to be electronic sent to the pharmacy on file     Has performed her typical HEP without benefit and trialed medications - both prescription and OTC without improvement. We discussed differing pain generators along with treatments of each and expectations thereof. We discussed a multimodal strategy to treat the patient's pain including physical therapy, pharmacotherapy, and interventional therapy. -Patient has tried and failed at least 6 weeks of conservative treatment (e.g., exercise, physical methods including PT and/or chiropractic care, nonsteroidal anti-inflammatory drugs and/or muscle relaxants).               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[place]     Arthritis     Asthma     Cardiomyopathy, dilated (CMS HCC)     CHF (congestive heart failure)     Colon polyps     Depression     Esophageal reflux     H/O sepsis     Heart attack     Hypothyroidism     LBBB (left bundle branch block)     Mild cognitive disorder     Mild congestive heart failure (CMS HCC)     MSSA bacteremia      Narcolepsy     Psoriatic arthritis (CMS HCC)     Sepsis     Sjogren's syndrome      Past Surgical History:   Procedure Laterality Date    AORTA SURGERY  1970    Tumor removal    BLADDER SURGERY  1980    Tacking    COLONOSCOPY      ESOPHAGOGASTRODUODENOSCOPY      HX APPENDECTOMY      HX CATARACT REMOVAL      HX CHOLECYSTECTOMY      HX HERNIA REPAIR      umbilical    HX HYSTERECTOMY  1980    HX KNEE SURGERY Bilateral     HX LUMBAR FUSION  2018    [facility]    HX PACEMAKER INSERTION  2010    History of infected leads    HX TONSILLECTOMY      REPLACEMENT TOTAL KNEE Right 2019    x2 (2025) - chronic infection in right knee for life    REPLACEMENT TOTAL KNEE Left     STEROID INJECTION HIP Left 08/2023    Dr. Dorcas office      Allergies[1]  Current Outpatient Medications   Medication Sig    cephalexin  (KEFLEX ) 500 mg Oral Capsule Take 1 Capsule (500 mg total) by mouth Four times a day    ENTRESTO  24-26 mg Oral Tablet Take 1 Tablet by mouth Twice daily  HYDROcodone -acetaminophen  (NORCO) 10-325 mg Oral Tablet Take 1 Tablet by mouth Every 6 hours as needed for Pain for up to 30 days    levothyroxine  (SYNTHROID ) 137 mcg Oral Tablet Take 1 Tablet (137 mcg total) by mouth Every morning for 180 days    lidocaine  (LIDODERM ) 5 % Adhesive Patch, Medicated Place 1 Patch (700 mg total) on the skin Daily for 90 days    metoprolol  succinate (TOPROL -XL) 25 mg Oral Tablet Sustained Release 24 hr Take 0.5 Tablets (12.5 mg total) by mouth Daily    rifAMPin  (RIFADIN ) 300 mg Oral Capsule Take 1 Capsule (300 mg total) by mouth (Patient taking differently: Take 1 Capsule (300 mg total) by mouth Twice daily)     Family Medical History:       Problem Relation (Age of Onset)    Arthritis-osteo Father    Breast Cancer Sister, Maternal Aunt    Colon Cancer Father    Diabetes Father    Heart Attack Other    Heart Disease Mother, Father    Osteoporosis Other           Social History     Socioeconomic History    Marital status: Widowed    Occupational History    Occupation: Retired   Tobacco Use    Smoking status: Former     Types: Cigarettes    Smokeless tobacco: Never   Vaping Use    Vaping status: Never Used   Substance and Sexual Activity    Alcohol use: Not Currently    Drug use: Never     Social Determinants of Health     Financial Resource Strain: Low Risk (09/11/2022)    Financial Resource Strain     SDOH Financial: No   Transportation Needs: Low Risk (09/11/2022)    Transportation Needs     SDOH Transportation: No   Social Connections: Low Risk (07/03/2023)    Social Connections     SDOH Social Isolation: 5 or more times a week   Intimate Partner Violence: High Risk (09/11/2022)    Intimate Partner Violence     SDOH Domestic Violence: No   Housing Stability: Low Risk (09/11/2022)    Housing Stability     SDOH Housing Situation: I have housing.     SDOH Housing Worry: No       Physical Exam:  Vital Signs:  BP 134/60   Pulse 72   Ht 1.499 m (4' 11)   Wt 59.4 kg (131 lb)   BMI 26.46 kg/m         Physical Exam  Vitals and nursing note reviewed.   Constitutional:       Appearance: Normal appearance.   Musculoskeletal:      Lumbar back: Tenderness present. Decreased range of motion. Negative left straight leg raise test.      Comments: SI Joint - Left -  + [place], + Thigh Thrust, + Compression, + FABER, + Distraction   Neurological:      Mental Status: She is alert and oriented to person, place, and time.      Sensory: Sensory deficit present.      Motor: Weakness present.         Assessment:    ICD-10-CM    1. Chronic pain syndrome  G89.4 Referral to PHYSICAL/OCCUPATIONAL THERAPY - External     CASE REQUEST SURGICAL: PAIN SERVICE DRAIN/INJECT SACROILIAC JOINT     CASE REQUEST SURGICAL: PAIN SERVICE BLOCK NERVE GREATER TROCHANTERIC      2.  Osteoarthritis of hips, bilateral  M16.0       3. Myalgia  M79.10 Referral to PHYSICAL/OCCUPATIONAL THERAPY - External      4. Sacroiliitis, not elsewhere classified (CMS HCC)  M46.1 CASE REQUEST SURGICAL: PAIN  SERVICE DRAIN/INJECT SACROILIAC JOINT      5. Spondylosis without myelopathy or radiculopathy, sacral and sacrococcygeal region  M47.818 CASE REQUEST SURGICAL: PAIN SERVICE DRAIN/INJECT SACROILIAC JOINT      6. Trochanteric bursitis of left hip  M70.62 CASE REQUEST SURGICAL: PAIN SERVICE BLOCK NERVE GREATER TROCHANTERIC      7. Hip bursitis  M70.70       8. Hip pain  M25.559            Plan:  Orders Placed This Encounter    Referral to PHYSICAL/OCCUPATIONAL THERAPY - External    CASE REQUEST SURGICAL: PAIN SERVICE DRAIN/INJECT SACROILIAC JOINT    CASE REQUEST SURGICAL: PAIN SERVICE BLOCK NERVE GREATER TROCHANTERIC     Baseline Urine Drug Screen prior to initiation of treatment for pain to identify any undisclosed medications or substances that could potentially lead to adverse interactions or compromise efficacy of care.   The goal is to improve pain and function with minimally invasive procedures.   Order Physical Therapy to be completed at location patient chooses for gait and strength training    Left Sacroiliac Joint Injection x 1 with fluoroscopy guidance    Hip Bursa Injection w/fluoroscopy guidance x 1    Order Lidocaine  5% patches to be electronic sent to the pharmacy on file   Patient was instructed to return to clinic with any new or worsening symptoms.     Follow up after injection     I am scribing for, and in the presence of, Dr. Lahoma for services provided on 11/12/2023.  Lacinda Eans, RN       Portions of this note may be dictated using voice recognition software or a dictation service. Variances in spelling and vocabulary are possible and unintentional. Not all errors are caught/corrected. Please notify the dino if any discrepancies are noted or if the meaning of any statement is not clear.          [1] No Known Allergies

## 2023-11-17 ENCOUNTER — Encounter (HOSPITAL_BASED_OUTPATIENT_CLINIC_OR_DEPARTMENT_OTHER): Payer: Self-pay | Admitting: FAMILY PRACTICE

## 2023-11-17 ENCOUNTER — Ambulatory Visit: Payer: Medicare Other

## 2023-11-17 DIAGNOSIS — Z95810 Presence of automatic (implantable) cardiac defibrillator: Secondary | ICD-10-CM

## 2023-11-17 DIAGNOSIS — I10 Essential (primary) hypertension: Secondary | ICD-10-CM

## 2023-11-17 DIAGNOSIS — I5042 Chronic combined systolic (congestive) and diastolic (congestive) heart failure: Secondary | ICD-10-CM

## 2023-11-17 DIAGNOSIS — I428 Other cardiomyopathies: Secondary | ICD-10-CM

## 2023-11-19 ENCOUNTER — Ambulatory Visit (INDEPENDENT_AMBULATORY_CARE_PROVIDER_SITE_OTHER): Payer: Self-pay

## 2023-11-19 NOTE — Telephone Encounter (Signed)
 Florence Sailors back [phone] daughter [name]

## 2023-11-19 NOTE — Telephone Encounter (Signed)
 Attempted to contact patient's daughter to discuss transthoracic echocardiogram results.    11/19/23 1150 - left voicemail

## 2023-11-20 NOTE — Telephone Encounter (Signed)
-----   Message from Aldona Lab, APRN, [place] sent at 11/19/2023  8:46 AM EST -----  She has moderate mitral valve regurgitation. Will need to repeat an echo in one year to assess the regurgitation.   ----- Message -----  From: Edwina Landsberg Results  Sent: 10/16/2023   5:30 PM EST  To: Aldona Lab, APRN, CNP

## 2023-11-20 NOTE — Telephone Encounter (Signed)
 Phone call with patient's daughter to discuss transthoracic echocardiogram results.    Patient's daughter verbalized understanding. Repeat echo in 1 year. F/U as scheduled 04/16/24.

## 2023-11-21 ENCOUNTER — Ambulatory Visit (HOSPITAL_BASED_OUTPATIENT_CLINIC_OR_DEPARTMENT_OTHER): Payer: Self-pay | Admitting: PAIN MANAGEMENT

## 2023-11-26 ENCOUNTER — Other Ambulatory Visit: Payer: Self-pay

## 2023-11-27 ENCOUNTER — Ambulatory Visit: Payer: Self-pay | Attending: FAMILY PRACTICE | Admitting: FAMILY PRACTICE

## 2023-11-27 ENCOUNTER — Encounter (HOSPITAL_BASED_OUTPATIENT_CLINIC_OR_DEPARTMENT_OTHER): Payer: Self-pay | Admitting: FAMILY PRACTICE

## 2023-11-27 VITALS — BP 133/74 | HR 78 | Temp 97.4°F | Ht 59.0 in | Wt 133.0 lb

## 2023-11-27 DIAGNOSIS — I110 Hypertensive heart disease with heart failure: Secondary | ICD-10-CM

## 2023-11-27 DIAGNOSIS — M461 Sacroiliitis, not elsewhere classified: Secondary | ICD-10-CM | POA: Insufficient documentation

## 2023-11-27 DIAGNOSIS — Z87891 Personal history of nicotine dependence: Secondary | ICD-10-CM

## 2023-11-27 DIAGNOSIS — I5042 Chronic combined systolic (congestive) and diastolic (congestive) heart failure: Secondary | ICD-10-CM | POA: Insufficient documentation

## 2023-11-27 DIAGNOSIS — M1612 Unilateral primary osteoarthritis, left hip: Secondary | ICD-10-CM | POA: Insufficient documentation

## 2023-11-27 DIAGNOSIS — M009 Pyogenic arthritis, unspecified: Secondary | ICD-10-CM | POA: Insufficient documentation

## 2023-11-27 DIAGNOSIS — E039 Hypothyroidism, unspecified: Secondary | ICD-10-CM | POA: Insufficient documentation

## 2023-11-27 DIAGNOSIS — I10 Essential (primary) hypertension: Secondary | ICD-10-CM | POA: Insufficient documentation

## 2023-11-27 LAB — LIPID PANEL
CHOLESTEROL: 168 mg/dL (ref 0–200)
HDL CHOL: 63 mg/dL
LDL CALC: 86 mg/dL (ref 0–99)
TRIGLYCERIDES: 106 mg/dL (ref 0–150)
VLDL CALC: 19 mg/dL (ref 0–30)

## 2023-11-27 LAB — COMPREHENSIVE METABOLIC PANEL, NON-FASTING
ALBUMIN: 4 g/dL (ref 3.5–5.2)
ALKALINE PHOSPHATASE: 59 U/L (ref 35–129)
ALT (SGPT): 13 U/L (ref 0–33)
ANION GAP: 9 mmol/L (ref 7–18)
AST (SGOT): 28 U/L (ref 0–32)
BILIRUBIN TOTAL: 0.3 mg/dL (ref 0.2–1.2)
BUN: 22 mg/dL (ref 8–23)
CALCIUM: 9.4 mg/dL (ref 8.3–10.7)
CHLORIDE: 103 mmol/L (ref 96–106)
CO2 TOTAL: 29 mmol/L (ref 22–30)
CREATININE: 1.04 mg/dL — ABNORMAL HIGH (ref 0.50–0.90)
ESTIMATED GFR: 53 mL/min/1.73mˆ2 — ABNORMAL LOW (ref >90)
GLUCOSE: 105 mg/dL (ref 74–109)
POTASSIUM: 4.2 mmol/L (ref 3.2–5.0)
PROTEIN TOTAL: 7.5 g/dL (ref 6.4–8.3)
SODIUM: 141 mmol/L (ref 133–144)

## 2023-11-27 LAB — THYROID STIMULATING HORMONE WITH FREE T4 REFLEX: TSH: 76.4 u[IU]/mL — ABNORMAL HIGH (ref 0.270–4.200)

## 2023-11-27 LAB — THYROXINE, FREE (FREE T4): THYROXINE (T4), FREE: 0.21 ng/dL — ABNORMAL LOW (ref 0.93–1.70)

## 2023-11-27 MED ORDER — GABAPENTIN 100 MG CAPSULE
100.0000 mg | ORAL_CAPSULE | Freq: Every evening | ORAL | 0 refills | Status: AC
Start: 2023-11-27 — End: ?

## 2023-11-27 MED ORDER — HYDROCODONE 10 MG-ACETAMINOPHEN 325 MG TABLET: 1.0000 | ORAL_TABLET | Freq: Four times a day (QID) | ORAL | 0 refills | Status: DC | PRN

## 2023-11-27 NOTE — Progress Notes (Signed)
 FAMILY MEDICINE, [facility]  [address]  [place] [address]  Operated by [facility]  History and Physical    Name: Ana Kennedy MRN:  [id number]   Date: 11/27/2023 DOB:  1935/05/12 (88 y.o.)            Discussion:    Assessment/Plan:  Ana Kennedy was seen today for follow up.    Diagnoses and all orders for this visit:    Chronic combined systolic and diastolic CHF, NYHA class 2 (CMS HCC)  -     LIPID PANEL; Future  -     COMPREHENSIVE METABOLIC PANEL, NON-FASTING; Future    Essential hypertension  -     COMPREHENSIVE METABOLIC PANEL, NON-FASTING; Future    Hypothyroidism  -     THYROID  STIMULATING HORMONE WITH FREE T4 REFLEX; Future    Primary osteoarthritis of left hip    Sacroiliitis, not elsewhere classified (CMS HCC)    Chronic infection of right knee (CMS HCC)       There are no discontinued medications.      Reason for Visit: Follow Up Ana Kennedy to pain clinic- )    History of Present Illness  Ana Kennedy is a 88 y.o. female who is being seen today for   Chief Complaint   Patient presents with    Follow Up     Went to pain clinic-         (I50.42) Chronic combined systolic and diastolic CHF, NYHA class 2 (CMS HCC)  (primary encounter diagnosis)  Plan: LIPID PANEL, COMPREHENSIVE METABOLIC PANEL,         NON-FASTING        On Entresto , no swelling    (I10) Essential hypertension  Plan: COMPREHENSIVE METABOLIC PANEL, NON-FASTING        Patient reports no dizziness upon standing,. no headaches, no nose bleeds, no chest pain, no shortness of breath.    (E03.9) Hypothyroidism  Plan: THYROID  STIMULATING HORMONE WITH FREE T4 REFLEX        Feels like thyroid  is working ok    (M16.12) Primary osteoarthritis of left hip  (M46.1) Sacroiliitis, not elsewhere classified (CMS HCC)  Plan: Pain management is planning on doing injection in back    (M00.9) Chronic infection of right knee (CMS HCC)  Plan: on keflex        Past Medical History:   Diagnosis Date    A-fib      Acrodermatitis continua of [place]     Arthritis     Asthma     Cardiomyopathy, dilated (CMS HCC)     CHF (congestive heart failure)     Colon polyps     Depression     Esophageal reflux     H/O sepsis     Heart attack     Hypothyroidism     LBBB (left bundle branch block)     Mild cognitive disorder     Mild congestive heart failure (CMS HCC)     MSSA bacteremia     Narcolepsy     Psoriatic arthritis (CMS HCC)     Sepsis     Sjogren's syndrome       Past Surgical History:   Procedure Laterality Date    AORTA SURGERY  1970    Tumor removal    BLADDER SURGERY  1980    Tacking    COLONOSCOPY      ESOPHAGOGASTRODUODENOSCOPY      HX APPENDECTOMY  HX CATARACT REMOVAL      HX CHOLECYSTECTOMY      HX HERNIA REPAIR      umbilical    HX HYSTERECTOMY  1980    HX KNEE SURGERY Bilateral     HX LUMBAR FUSION  2018    [facility]    HX PACEMAKER INSERTION  2010    History of infected leads    HX TONSILLECTOMY      REPLACEMENT TOTAL KNEE Right 2019    x2 (2025) - chronic infection in right knee for life    REPLACEMENT TOTAL KNEE Left     STEROID INJECTION HIP Left 08/2023    Dr. Dorcas office         Family Medical History:       Problem Relation (Age of Onset)    Arthritis-osteo Father    Breast Cancer Sister, Maternal Aunt    Colon Cancer Father    Diabetes Father    Heart Attack Other    Heart Disease Mother, Father    Osteoporosis Other            Social History[1]    Review of Systems  Review of Systems   Constitutional:  Negative for chills and fever.   Respiratory:  Negative for shortness of breath.    Cardiovascular:  Negative for chest pain.        Medication:  cephalexin  (KEFLEX ) 500 mg Oral Capsule, Take 1 Capsule (500 mg total) by mouth Four times a day  ENTRESTO  24-26 mg Oral Tablet, Take 1 Tablet by mouth Twice daily  HYDROcodone -acetaminophen  (NORCO) 10-325 mg Oral Tablet, Take 1 Tablet by mouth Every 6 hours as needed for Pain for up to 30 days  levothyroxine  (SYNTHROID ) 137 mcg Oral Tablet, Take 1 Tablet (137  mcg total) by mouth Every morning for 180 days  lidocaine  (LIDODERM ) 5 % Adhesive Patch, Medicated, Place 1 Patch (700 mg total) on the skin Daily for 90 days  metoprolol  succinate (TOPROL -XL) 25 mg Oral Tablet Sustained Release 24 hr, Take 0.5 Tablets (12.5 mg total) by mouth Daily  rifAMPin  (RIFADIN ) 300 mg Oral Capsule, Take 1 Capsule (300 mg total) by mouth (Patient taking differently: Take 1 Capsule (300 mg total) by mouth Twice daily)    No facility-administered medications prior to visit.    Allergies:Allergies[2]    Physical Exam:  Vitals:    11/27/23 1459   BP: 133/74   Pulse: 78   Temp: 36.3 C (97.4 F)   SpO2: 94%   Weight: 60.3 kg (133 lb)   Height: 1.499 m (4' 11)   BMI: 26.86      Physical Exam  Constitutional:       General: She is not in acute distress.     Appearance: Normal appearance.   Cardiovascular:      Rate and Rhythm: Normal rate and regular rhythm.      Heart sounds: No murmur heard.  Pulmonary:      Breath sounds: Normal breath sounds. No wheezing, rhonchi or rales.   Neurological:      Mental Status: She is oriented to person, place, and time.              Reviewed family history, medication history, social history, and allergies with the patient today.   Follow up: No follow-ups on file.  Seek medical attention for new or worsening symptoms.  Medication list was confirmed with the patient and changes reflected in the medical record.  Discussed potential side  effects as well as requirements of monitoring for any medications that were changed or added today.    [name], MD          This note was partially created using MModal Fluency Direct system (voice recognition software) and is inherently subject to errors including those of syntax and sound-alike substitutions which may escape proofreading.  In such instances, original meaning may be extrapolated by contextual derivation.       [1]   Social History  Tobacco Use    Smoking status: Former     Types: Cigarettes     Smokeless tobacco: Never   Vaping Use    Vaping status: Never Used   Substance Use Topics    Alcohol use: Not Currently    Drug use: Never   [2] No Known Allergies

## 2023-12-15 ENCOUNTER — Telehealth (HOSPITAL_BASED_OUTPATIENT_CLINIC_OR_DEPARTMENT_OTHER): Payer: Self-pay | Admitting: FAMILY PRACTICE

## 2023-12-15 NOTE — Telephone Encounter (Signed)
 [phone]  Tyler      Pt wound is still not healed     Is it ok to continue nursing ??    Wound slightly worse   Trying to get her into wound clinic talking to her daughter     Please advise  Thank you.[email or web address]

## 2023-12-16 NOTE — Telephone Encounter (Signed)
 Left Voicemail

## 2023-12-18 NOTE — Telephone Encounter (Signed)
 Left Voicemail

## 2023-12-25 ENCOUNTER — Encounter (HOSPITAL_BASED_OUTPATIENT_CLINIC_OR_DEPARTMENT_OTHER): Admission: RE | Payer: Self-pay | Source: Ambulatory Visit

## 2023-12-25 ENCOUNTER — Encounter (HOSPITAL_BASED_OUTPATIENT_CLINIC_OR_DEPARTMENT_OTHER): Payer: Self-pay | Admitting: PAIN MANAGEMENT

## 2023-12-25 ENCOUNTER — Ambulatory Visit: Admission: RE | Admit: 2023-12-25 | Source: Ambulatory Visit | Admitting: PAIN MANAGEMENT

## 2023-12-25 ENCOUNTER — Ambulatory Visit (HOSPITAL_COMMUNITY)

## 2023-12-25 ENCOUNTER — Ambulatory Visit
Admission: RE | Admit: 2023-12-25 | Discharge: 2023-12-25 | Disposition: A | Discharge status: Home / Self Care | Source: Ambulatory Visit | Attending: PAIN MANAGEMENT | Admitting: PAIN MANAGEMENT

## 2023-12-25 ENCOUNTER — Ambulatory Visit (HOSPITAL_BASED_OUTPATIENT_CLINIC_OR_DEPARTMENT_OTHER): Admitting: PAIN MANAGEMENT

## 2023-12-25 ENCOUNTER — Encounter (HOSPITAL_BASED_OUTPATIENT_CLINIC_OR_DEPARTMENT_OTHER)
Admission: RE | Disposition: A | Discharge status: Home / Self Care | Payer: Self-pay | Source: Ambulatory Visit | Attending: PAIN MANAGEMENT

## 2023-12-25 ENCOUNTER — Other Ambulatory Visit: Payer: Self-pay

## 2023-12-25 DIAGNOSIS — G894 Chronic pain syndrome: Secondary | ICD-10-CM | POA: Insufficient documentation

## 2023-12-25 DIAGNOSIS — M7070 Other bursitis of hip, unspecified hip: Secondary | ICD-10-CM | POA: Insufficient documentation

## 2023-12-25 DIAGNOSIS — M25559 Pain in unspecified hip: Secondary | ICD-10-CM | POA: Insufficient documentation

## 2023-12-25 DIAGNOSIS — M461 Sacroiliitis, not elsewhere classified: Secondary | ICD-10-CM | POA: Insufficient documentation

## 2023-12-25 DIAGNOSIS — M47818 Spondylosis without myelopathy or radiculopathy, sacral and sacrococcygeal region: Secondary | ICD-10-CM | POA: Insufficient documentation

## 2023-12-25 SURGERY — PAIN SERVICE BLOCK NERVE GREATER TROCHANTERIC
Anesthesia: Local (Nurse-Monitored) | Laterality: Left

## 2023-12-25 MED ORDER — IOPAMIDOL 200 MG IODINE/ML (41 %) INTRATHECAL SOLUTION
Freq: Once | INTRATHECAL | Status: DC | PRN
  Administered 2023-12-25: 12:00:00 2 mL via INTRAMUSCULAR

## 2023-12-25 MED ORDER — BUPIVACAINE (PF) 0.25 % (2.5 MG/ML) INJECTION SOLUTION
Freq: Once | INTRAMUSCULAR | Status: DC | PRN
  Administered 2023-12-25: 12:00:00 6 mL via INTRAMUSCULAR

## 2023-12-25 MED ORDER — TRIAMCINOLONE ACETONIDE 40 MG/ML SUSPENSION FOR INJECTION
Freq: Once | INTRAMUSCULAR | Status: DC | PRN
  Administered 2023-12-25: 12:00:00 40 mg via INTRAMUSCULAR

## 2023-12-25 MED ORDER — LIDOCAINE HCL 10 MG/ML (1 %) INJECTION SOLUTION
Freq: Once | INTRAMUSCULAR | Status: DC | PRN
  Administered 2023-12-25: 12:00:00 10 mL via INTRADERMAL

## 2023-12-25 NOTE — OR Surgeon (Signed)
 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    Description of Procedure/Operation:  Procedure Performed: Left Sacroiliac Joint and Hip Bursa Injection under Fluoroscopic Guidance  MEDICAL NECESSITY: The patient has tried or failed multiple conservative treatment measures in the past or those measures were not appropriate. Patient's QOL and ability to complete ADL's continued to worsen, thus leading to worsening pain and decreased function. Based on the history and physical exam from previous treatments today's procedure is medically indicated and necessary. All patient initiated questions were answered. Risks discussed and patient wished to proceed. Informed consent was then obtained.  DESCRIPTION OF PROCEDURE: After detailed informed consent was obtained, the patient was brought to the procedure room and placed in the proper position. The patient's lower back was prepped and draped in a sterile fashion. Under fluoroscopic guidance, the sacroiliac joint and hip bursa were visualized and marked. The skin was infiltrated with a total 20 mL of 1% lidocaine  using #25 needle over each intended area of treatment. First, a [22]-gauge spinal needle was introduced into the sacroiliac joint under fluoroscopic guidance. After negative aspiration for blood, CSF, or air, 2 mL of Isovue , contrast material was injected to outline the intraarticular joint space. Next, using appropriate landmarks and Fluoroscopic  guidance, two #25 gauge needles were used to obtain the superior and inferior aspects of the bursa. After negative aspiration at each location, a total mixture containing 6 mL of 0.25% Marcaine  with 40 mg Triamcinolone  was divided and injected into the joint and the bursa, without any problem. Needles were flushed and removed. The patient tolerated the procedure well and there were no complications. Dressings were then applied.  Patient was monitored in the recovery area for the appropriate time and discharged in stable condition. Post-procedure education was provided.     Evalene JONELLE Roche, MD

## 2023-12-25 NOTE — H&P (Signed)
 H & P updated the day of the procedure.    1.  H&P completed within 30 days of surgical procedure and has been reviewed within 24 hours of admission but prior to surgery or a procedure requiring anesthesia services, the patient has been examined, and no change has occured in the patients condition since the H&P was completed.       Change in medications: No         2.  Patient continues to be appropriate candidate for planned surgical procedure. YES    Prescott Gum, MD

## 2023-12-25 NOTE — Nurses Notes (Signed)
 Ice pack applied to injection sites. VSS. Pt denies any questions or concerns. Pt ambulated out with staff.

## 2023-12-25 NOTE — Discharge Instructions (Signed)
 Post Care Injection Instructions    Apply ice to the procedure site for 30 minute intervals.   Do this on at least 3 occasions: do not exceed 30 minutes at a time.  Continue ice and ibuprofen for up to 3 days if site pain continues.   You may remove band-aids after one hour.   Normal activity may resume the following day as able.  You may take medications prescribed by the physicians as needed for pain.  Do not drive for 12 hours following the procedure.  No baths for 24 hours following the procedure, but can shower.  You may experience some numbness or weakness for the next 12 hours. Please be careful walking and standing.  Notify your physician if redness or swelling persists, if fever develops, if the wound has drainage, or a rash develops.   If you have any further questions please ask the nurse or a staff member for assistance prior to check-out or call the [facility] at [phone].

## 2023-12-26 DIAGNOSIS — M7070 Other bursitis of hip, unspecified hip: Secondary | ICD-10-CM | POA: Insufficient documentation

## 2023-12-26 DIAGNOSIS — G894 Chronic pain syndrome: Secondary | ICD-10-CM | POA: Insufficient documentation

## 2023-12-26 DIAGNOSIS — M461 Sacroiliitis, not elsewhere classified: Secondary | ICD-10-CM | POA: Insufficient documentation

## 2023-12-26 DIAGNOSIS — M25559 Pain in unspecified hip: Secondary | ICD-10-CM | POA: Insufficient documentation

## 2023-12-26 DIAGNOSIS — M47818 Spondylosis without myelopathy or radiculopathy, sacral and sacrococcygeal region: Secondary | ICD-10-CM | POA: Insufficient documentation

## 2024-01-09 ENCOUNTER — Ambulatory Visit (HOSPITAL_BASED_OUTPATIENT_CLINIC_OR_DEPARTMENT_OTHER): Payer: Self-pay | Admitting: NURSE PRACTITIONER

## 2024-01-12 ENCOUNTER — Other Ambulatory Visit (HOSPITAL_BASED_OUTPATIENT_CLINIC_OR_DEPARTMENT_OTHER): Payer: Self-pay | Admitting: FAMILY PRACTICE

## 2024-01-12 ENCOUNTER — Ambulatory Visit (HOSPITAL_BASED_OUTPATIENT_CLINIC_OR_DEPARTMENT_OTHER): Payer: Self-pay | Admitting: FAMILY PRACTICE

## 2024-01-12 DIAGNOSIS — M1612 Unilateral primary osteoarthritis, left hip: Secondary | ICD-10-CM

## 2024-01-13 MED ORDER — HYDROCODONE 10 MG-ACETAMINOPHEN 325 MG TABLET: 1.0000 | ORAL_TABLET | Freq: Four times a day (QID) | ORAL | 0 refills | Status: DC | PRN

## 2024-01-13 NOTE — Telephone Encounter (Signed)
 Pt daughter called back about this I did let her know to give you 24 hrs her mom is in pain she has apt with you 13th

## 2024-01-14 ENCOUNTER — Telehealth (HOSPITAL_BASED_OUTPATIENT_CLINIC_OR_DEPARTMENT_OTHER): Payer: Self-pay | Admitting: FAMILY PRACTICE

## 2024-01-14 NOTE — Telephone Encounter (Signed)
 Called and wanted to know if they could get an order for PT  FAX OR VERBAL [phone]  [facility]

## 2024-01-20 ENCOUNTER — Ambulatory Visit: Attending: FAMILY PRACTICE | Admitting: FAMILY PRACTICE

## 2024-01-20 ENCOUNTER — Encounter (HOSPITAL_BASED_OUTPATIENT_CLINIC_OR_DEPARTMENT_OTHER): Payer: Self-pay | Admitting: FAMILY PRACTICE

## 2024-01-20 ENCOUNTER — Other Ambulatory Visit: Payer: Self-pay

## 2024-01-20 VITALS — BP 90/49 | HR 77 | Temp 98.1°F | Ht 59.0 in | Wt 134.0 lb

## 2024-01-20 DIAGNOSIS — E039 Hypothyroidism, unspecified: Secondary | ICD-10-CM | POA: Insufficient documentation

## 2024-01-20 DIAGNOSIS — M1612 Unilateral primary osteoarthritis, left hip: Secondary | ICD-10-CM | POA: Insufficient documentation

## 2024-01-20 MED ORDER — LEVOTHYROXINE 150 MCG TABLET: 150.0000 ug | ORAL_TABLET | Freq: Every morning | ORAL | 1 refills | Status: AC

## 2024-01-20 MED ORDER — OXYCODONE-ACETAMINOPHEN 10 MG-325 MG TABLET: 1.0000 | ORAL_TABLET | Freq: Four times a day (QID) | ORAL | 0 refills | Status: AC | PRN

## 2024-01-20 MED ORDER — NALOXONE 4 MG/ACTUATION NASAL SPRAY: 1.0000 | NASAL | 0 refills | Status: AC | PRN

## 2024-01-20 MED ORDER — LEVOTHYROXINE 137 MCG TABLET: 150.0000 ug | ORAL_TABLET | Freq: Every morning | ORAL | 1 refills | Status: DC

## 2024-01-26 ENCOUNTER — Ambulatory Visit (HOSPITAL_BASED_OUTPATIENT_CLINIC_OR_DEPARTMENT_OTHER): Admitting: NURSE PRACTITIONER

## 2024-01-27 ENCOUNTER — Other Ambulatory Visit (INDEPENDENT_AMBULATORY_CARE_PROVIDER_SITE_OTHER): Payer: Self-pay | Admitting: INFECTIOUS DISEASE

## 2024-01-28 ENCOUNTER — Telehealth (INDEPENDENT_AMBULATORY_CARE_PROVIDER_SITE_OTHER): Payer: Self-pay | Admitting: CARDIOVASCULAR DISEASE

## 2024-01-28 NOTE — Telephone Encounter (Signed)
 Entresto  was denied by insurance and discussed pt will try valsartan .

## 2024-02-03 ENCOUNTER — Ambulatory Visit (HOSPITAL_BASED_OUTPATIENT_CLINIC_OR_DEPARTMENT_OTHER): Payer: Self-pay | Admitting: FAMILY PRACTICE

## 2024-02-03 NOTE — Progress Notes (Signed)
 FAMILY MEDICINE, [facility]  [address]  [place] [address]  Operated by [facility]  History and Physical    Name: Ana Kennedy MRN:  [id number]   Date: 01/20/2024 DOB:  1935/08/27 (88 y.o.)            Discussion:    Assessment/Plan:  Ana Kennedy was seen today for follow up.    Diagnoses and all orders for this visit:    Arthritis of left hip    Hypothyroidism    Other orders  -     Discontinue: levothyroxine  (SYNTHROID ) 137 mcg Oral Tablet; Take 1 Tablet (137 mcg total) by mouth Every morning  -     levothyroxine  (SYNTHROID ) 150 mcg Oral Tablet; Take 1 Tablet (150 mcg total) by mouth Every morning  -     oxyCODONE -acetaminophen  (PERCOCET) 10-325 mg Oral tablet; Take 1 Tablet by mouth Every 6 hours as needed for Pain for up to 30 days  -     naloxone  (NARCAN ) 4 mg per spray nasal spray; 1 Spray by INTRANASAL route Every 2 minutes as needed for Other (actual or suspected opioid overdose) Call 911 if given.       Medications Discontinued During This Encounter   Medication Reason    levothyroxine  (SYNTHROID ) 137 mcg Oral Tablet     levothyroxine  (SYNTHROID ) 137 mcg Oral Tablet     HYDROcodone -acetaminophen  (NORCO) 10-325 mg Oral Tablet          Reason for Visit: Follow Up    History of Present Illness  Ana Kennedy is a 89 y.o. female who is being seen today for   Chief Complaint   Patient presents with    Follow Up        (M16.12) Arthritis of left hip  (primary encounter diagnosis)  Plan: hydrocodone  not working very well for her pain    (E03.9) Hypothyroidism  Plan: THS high and Free T4 were low, needs synthroid  dose increased       Past Medical History:   Diagnosis Date    A-fib     Acrodermatitis continua of [place]     Arthritis     Asthma     Cardiomyopathy, dilated (CMS HCC)     CHF (congestive heart failure)     Colon polyps     Depression     Esophageal reflux     H/O sepsis     Heart attack     Hypothyroidism     LBBB (left bundle branch block)      Mild cognitive disorder     Mild congestive heart failure (CMS HCC)     MSSA bacteremia     Narcolepsy     Psoriatic arthritis (CMS HCC)     Sepsis     Sjogren's syndrome       Past Surgical History:   Procedure Laterality Date    AORTA SURGERY  1970    Tumor removal    BLADDER SURGERY  1980    Tacking    COLONOSCOPY      ESOPHAGOGASTRODUODENOSCOPY      HX APPENDECTOMY      HX CATARACT REMOVAL      HX CHOLECYSTECTOMY      HX HERNIA REPAIR      umbilical    HX HYSTERECTOMY  1980    HX KNEE SURGERY Bilateral     HX LUMBAR FUSION  2018    [facility]    HX PACEMAKER INSERTION  2010    History of infected leads    HX TONSILLECTOMY      REPLACEMENT TOTAL KNEE Right 2019    x2 (2025) - chronic infection in right knee for life    REPLACEMENT TOTAL KNEE Left     STEROID INJECTION HIP Left 08/2023    Dr. Dorcas office         Family Medical History:       Problem Relation (Age of Onset)    Arthritis-osteo Father    Breast Cancer Sister, Maternal Aunt    Colon Cancer Father    Diabetes Father    Heart Attack Other    Heart Disease Mother, Father    Osteoporosis Other            Social History[1]    Review of Systems  Review of Systems   Constitutional:  Negative for chills and fever.   Respiratory:  Negative for shortness of breath.    Cardiovascular:  Negative for chest pain.        Medication:  cephalexin  (KEFLEX ) 500 mg Oral Capsule, Take 1 Capsule (500 mg total) by mouth Four times a day  ENTRESTO  24-26 mg Oral Tablet, Take 1 Tablet by mouth Twice daily  gabapentin  (NEURONTIN ) 100 mg Oral Capsule, Take 1 Capsule (100 mg total) by mouth Every evening  lidocaine  (LIDODERM ) 5 % Adhesive Patch, Medicated, Place 1 Patch (700 mg total) on the skin Daily for 90 days  metoprolol  succinate (TOPROL -XL) 25 mg Oral Tablet Sustained Release 24 hr, Take 0.5 Tablets (12.5 mg total) by mouth Daily  rifAMPin  (RIFADIN ) 300 mg Oral Capsule, Take 1 Capsule (300 mg total) by mouth (Patient taking differently: Take 1 Capsule (300 mg total) by  mouth Twice daily)  HYDROcodone -acetaminophen  (NORCO) 10-325 mg Oral Tablet, Take 1 Tablet by mouth Every 6 hours as needed for Pain for up to 30 days  levothyroxine  (SYNTHROID ) 137 mcg Oral Tablet, Take 1 Tablet (137 mcg total) by mouth Every morning for 180 days    No facility-administered medications prior to visit.    Allergies:Allergies[2]    Physical Exam:  Vitals:    01/20/24 1525 01/20/24 1527   BP: (!) 87/45 (!) 90/49   Pulse: 76 77   Temp: 36.7 C (98.1 F)    SpO2: 94%    Weight: 60.8 kg (134 lb)    Height: 1.499 m (4' 11)    BMI: 27.06       Physical Exam  Constitutional:       General: She is not in acute distress.     Appearance: Normal appearance.   Cardiovascular:      Rate and Rhythm: Normal rate and regular rhythm.      Heart sounds: No murmur heard.  Pulmonary:      Breath sounds: Normal breath sounds. No wheezing, rhonchi or rales.   Neurological:      Mental Status: She is oriented to person, place, and time.              Reviewed family history, medication history, social history, and allergies with the patient today.   Follow up: Return in about 6 weeks (around 03/02/2024).    Seek medical attention for new or worsening symptoms.  Medication list was confirmed with the patient and changes reflected in the medical record.  Discussed potential side effects as well as requirements of monitoring for any medications that were changed or added today.    Ana Dasie Gal, MD  This note was partially created using MModal Fluency Direct system (voice recognition software) and is inherently subject to errors including those of syntax and sound-alike substitutions which may escape proofreading.  In such instances, original meaning may be extrapolated by contextual derivation.       [1]   Social History  Tobacco Use    Smoking status: Former     Types: Cigarettes    Smokeless tobacco: Never   Vaping Use    Vaping status: Never Used   Substance Use Topics    Alcohol use: Not Currently    Drug  use: Never   [2] No Known Allergies

## 2024-02-05 ENCOUNTER — Telehealth (HOSPITAL_BASED_OUTPATIENT_CLINIC_OR_DEPARTMENT_OTHER): Payer: Self-pay | Admitting: FAMILY PRACTICE

## 2024-02-05 NOTE — Telephone Encounter (Signed)
 Patients daughter called and said that pharmacy would not fill her Percocet because she had p/u her Norco back on 01/14/24. He said that if she is switching you could send in another script with a note or he said he would take a verbal over the phone.     Please advise. Thank you

## 2024-02-05 NOTE — Telephone Encounter (Signed)
 Gave verbal to pharmacist  He said she would need to bring the Norco in for them to destroy

## 2024-02-05 NOTE — Telephone Encounter (Signed)
 Ana Daring, LPN - [facility] called stating that they have been seeing pt for non healing wound on right shin. He also state there have been changes. There is another wound in same area with drainage. Also right foot has swelling- pitting edema +2 that was not there before.  Please advise  Brian# [phone]

## 2024-02-09 ENCOUNTER — Encounter (HOSPITAL_BASED_OUTPATIENT_CLINIC_OR_DEPARTMENT_OTHER): Payer: Self-pay | Admitting: FAMILY PRACTICE

## 2024-02-09 ENCOUNTER — Ambulatory Visit (HOSPITAL_BASED_OUTPATIENT_CLINIC_OR_DEPARTMENT_OTHER): Admitting: FAMILY PRACTICE

## 2024-02-12 ENCOUNTER — Other Ambulatory Visit (HOSPITAL_COMMUNITY)

## 2024-02-12 ENCOUNTER — Encounter (HOSPITAL_BASED_OUTPATIENT_CLINIC_OR_DEPARTMENT_OTHER): Payer: Self-pay | Admitting: FAMILY PRACTICE

## 2024-02-12 ENCOUNTER — Ambulatory Visit: Admitting: INTERNAL MEDICINE

## 2024-02-12 ENCOUNTER — Other Ambulatory Visit: Payer: Self-pay

## 2024-02-12 ENCOUNTER — Ambulatory Visit (INDEPENDENT_AMBULATORY_CARE_PROVIDER_SITE_OTHER): Payer: Self-pay | Admitting: ORTHOPAEDIC SURGERY

## 2024-02-12 DIAGNOSIS — E039 Hypothyroidism, unspecified: Secondary | ICD-10-CM

## 2024-02-12 DIAGNOSIS — I509 Heart failure, unspecified: Secondary | ICD-10-CM

## 2024-02-12 DIAGNOSIS — L4050 Arthropathic psoriasis, unspecified: Secondary | ICD-10-CM

## 2024-02-12 LAB — CBC WITH DIFF
BASOPHIL #: <0.1 10*3/uL (ref <=0.20)
BASOPHIL %: 0.5 %
EOSINOPHIL #: 0.43 10*3/uL (ref <=0.50)
EOSINOPHIL %: 7.4 %
HCT: 30 % — ABNORMAL LOW (ref 34.8–46.0)
HGB: 9.9 g/dL — ABNORMAL LOW (ref 11.5–16.0)
IMMATURE GRANULOCYTE #: <0.1 10*3/uL (ref <0.10)
IMMATURE GRANULOCYTE %: 1 % (ref 0.0–1.0)
LYMPHOCYTE #: 0.61 10*3/uL — ABNORMAL LOW (ref 1.00–4.80)
LYMPHOCYTE %: 10.5 %
MCH: 32 pg (ref 26.0–32.0)
MCHC: 33 g/dL (ref 31.0–35.5)
MCV: 97.1 fL (ref 78.0–100.0)
MONOCYTE #: 0.42 10*3/uL (ref 0.20–1.10)
MONOCYTE %: 7.2 %
MPV: 10.1 fL (ref 8.7–12.5)
NEUTROPHIL #: 4.25 10*3/uL (ref 1.50–7.70)
NEUTROPHIL %: 73.4 %
PLATELETS: 209 10*3/uL (ref 150–400)
RBC: 3.09 10*6/uL — ABNORMAL LOW (ref 3.85–5.22)
RDW-CV: 17.2 % — ABNORMAL HIGH (ref 11.5–15.5)
WBC: 5.8 10*3/uL (ref 3.7–11.0)

## 2024-02-12 LAB — BASIC METABOLIC PANEL
ANION GAP: 7 mmol/L (ref 4–13)
BUN/CREA RATIO: 20 (ref 6–22)
BUN: 18 mg/dL (ref 8–25)
CALCIUM: 8.7 mg/dL (ref 8.6–10.3)
CHLORIDE: 105 mmol/L (ref 96–111)
CO2 TOTAL: 27 mmol/L (ref 23–31)
CREATININE: 0.88 mg/dL (ref 0.60–1.05)
GLUCOSE: 111 mg/dL (ref 65–125)
POTASSIUM: 3.9 mmol/L (ref 3.5–5.1)
SODIUM: 139 mmol/L (ref 136–145)
eGFRcr - FEMALE: 64 mL/min/{1.73_m2} (ref >=60)

## 2024-02-12 LAB — THYROID STIMULATING HORMONE (SENSITIVE TSH): TSH: 46.047 u[IU]/mL — ABNORMAL HIGH (ref 0.350–4.940)

## 2024-04-16 ENCOUNTER — Ambulatory Visit (INDEPENDENT_AMBULATORY_CARE_PROVIDER_SITE_OTHER): Payer: Self-pay
